# Patient Record
Sex: Male | Born: 1935
Health system: Southern US, Community
[De-identification: ages and names within clinical notes are randomized; demographics above are authoritative.]

## PROBLEM LIST (undated history)

## (undated) DIAGNOSIS — F32A Depression, unspecified: Secondary | ICD-10-CM

## (undated) DIAGNOSIS — K219 Gastro-esophageal reflux disease without esophagitis: Secondary | ICD-10-CM

## (undated) DIAGNOSIS — Z860100 Personal history of colon polyps, unspecified: Secondary | ICD-10-CM

## (undated) DIAGNOSIS — I714 Abdominal aortic aneurysm, without rupture: Secondary | ICD-10-CM

## (undated) DIAGNOSIS — H269 Unspecified cataract: Secondary | ICD-10-CM

## (undated) DIAGNOSIS — I10 Essential (primary) hypertension: Secondary | ICD-10-CM

## (undated) DIAGNOSIS — T7840XA Allergy, unspecified, initial encounter: Secondary | ICD-10-CM

## (undated) DIAGNOSIS — F329 Major depressive disorder, single episode, unspecified: Secondary | ICD-10-CM

## (undated) DIAGNOSIS — F419 Anxiety disorder, unspecified: Secondary | ICD-10-CM

## (undated) DIAGNOSIS — E785 Hyperlipidemia, unspecified: Secondary | ICD-10-CM

## (undated) DIAGNOSIS — Z720 Tobacco use: Secondary | ICD-10-CM

## (undated) DIAGNOSIS — C801 Malignant (primary) neoplasm, unspecified: Secondary | ICD-10-CM

## (undated) DIAGNOSIS — I739 Peripheral vascular disease, unspecified: Secondary | ICD-10-CM

## (undated) DIAGNOSIS — K5790 Diverticulosis of intestine, part unspecified, without perforation or abscess without bleeding: Secondary | ICD-10-CM

## (undated) DIAGNOSIS — I639 Cerebral infarction, unspecified: Secondary | ICD-10-CM

## (undated) DIAGNOSIS — K759 Inflammatory liver disease, unspecified: Secondary | ICD-10-CM

## (undated) DIAGNOSIS — N200 Calculus of kidney: Secondary | ICD-10-CM

## (undated) HISTORY — DX: Malignant (primary) neoplasm, unspecified: C80.1

## (undated) HISTORY — DX: Anxiety disorder, unspecified: F41.9

## (undated) HISTORY — PX: VASECTOMY: SHX75

## (undated) HISTORY — DX: Depression, unspecified: F32.A

## (undated) HISTORY — DX: Cerebral infarction, unspecified: I63.9

## (undated) HISTORY — DX: Inflammatory liver disease, unspecified: K75.9

## (undated) HISTORY — DX: Abdominal aortic aneurysm, without rupture: I71.4

## (undated) HISTORY — DX: Major depressive disorder, single episode, unspecified: F32.9

## (undated) HISTORY — DX: Calculus of kidney: N20.0

## (undated) HISTORY — DX: Peripheral vascular disease, unspecified: I73.9

## (undated) HISTORY — DX: Tobacco use: Z72.0

## (undated) HISTORY — PX: EYE SURGERY: SHX253

## (undated) HISTORY — DX: Unspecified cataract: H26.9

## (undated) HISTORY — DX: Hyperlipidemia, unspecified: E78.5

## (undated) HISTORY — DX: Allergy, unspecified, initial encounter: T78.40XA

## (undated) HISTORY — DX: Essential (primary) hypertension: I10

## (undated) HISTORY — DX: Diverticulosis of intestine, part unspecified, without perforation or abscess without bleeding: K57.90

## (undated) HISTORY — DX: Personal history of colon polyps, unspecified: Z86.0100

## (undated) HISTORY — PX: OTHER SURGICAL HISTORY: SHX169

## (undated) HISTORY — DX: Personal history of colonic polyps: Z86.010

## (undated) HISTORY — DX: Gastro-esophageal reflux disease without esophagitis: K21.9

## (undated) HISTORY — PX: COLONOSCOPY: SHX174

---

## 1998-09-09 ENCOUNTER — Emergency Department (HOSPITAL_COMMUNITY): Admission: EM | Admit: 1998-09-09 | Discharge: 1998-09-09 | Payer: Self-pay | Admitting: Emergency Medicine

## 2002-05-31 ENCOUNTER — Other Ambulatory Visit: Admission: RE | Admit: 2002-05-31 | Discharge: 2002-05-31 | Payer: Self-pay | Admitting: Dermatology

## 2002-07-09 ENCOUNTER — Other Ambulatory Visit: Admission: RE | Admit: 2002-07-09 | Discharge: 2002-07-09 | Payer: Self-pay | Admitting: Dermatology

## 2003-03-13 ENCOUNTER — Emergency Department (HOSPITAL_COMMUNITY): Admission: EM | Admit: 2003-03-13 | Discharge: 2003-03-13 | Payer: Self-pay | Admitting: *Deleted

## 2003-03-13 ENCOUNTER — Encounter: Payer: Self-pay | Admitting: *Deleted

## 2003-05-01 ENCOUNTER — Other Ambulatory Visit: Admission: RE | Admit: 2003-05-01 | Discharge: 2003-05-01 | Payer: Self-pay | Admitting: Dermatology

## 2003-10-28 ENCOUNTER — Other Ambulatory Visit: Admission: RE | Admit: 2003-10-28 | Discharge: 2003-10-28 | Payer: Self-pay | Admitting: Dermatology

## 2004-03-12 ENCOUNTER — Inpatient Hospital Stay (HOSPITAL_COMMUNITY): Admission: EM | Admit: 2004-03-12 | Discharge: 2004-03-16 | Payer: Self-pay | Admitting: Cardiology

## 2004-03-12 ENCOUNTER — Emergency Department (HOSPITAL_COMMUNITY): Admission: EM | Admit: 2004-03-12 | Discharge: 2004-03-12 | Payer: Self-pay | Admitting: Emergency Medicine

## 2004-05-18 ENCOUNTER — Ambulatory Visit (HOSPITAL_COMMUNITY): Admission: RE | Admit: 2004-05-18 | Discharge: 2004-05-18 | Payer: Self-pay | Admitting: Cardiology

## 2004-07-06 ENCOUNTER — Encounter (INDEPENDENT_AMBULATORY_CARE_PROVIDER_SITE_OTHER): Payer: Self-pay | Admitting: *Deleted

## 2004-07-06 ENCOUNTER — Ambulatory Visit (HOSPITAL_COMMUNITY): Admission: RE | Admit: 2004-07-06 | Discharge: 2004-07-06 | Payer: Self-pay | Admitting: Vascular Surgery

## 2004-08-25 ENCOUNTER — Ambulatory Visit: Payer: Self-pay | Admitting: Internal Medicine

## 2004-09-02 ENCOUNTER — Ambulatory Visit: Payer: Self-pay | Admitting: Internal Medicine

## 2004-11-05 ENCOUNTER — Ambulatory Visit: Payer: Self-pay | Admitting: Internal Medicine

## 2004-11-11 ENCOUNTER — Ambulatory Visit: Payer: Self-pay | Admitting: Internal Medicine

## 2004-12-07 ENCOUNTER — Ambulatory Visit: Payer: Self-pay | Admitting: Family Medicine

## 2004-12-21 ENCOUNTER — Ambulatory Visit: Payer: Self-pay | Admitting: Family Medicine

## 2004-12-30 ENCOUNTER — Ambulatory Visit: Payer: Self-pay | Admitting: Family Medicine

## 2005-01-20 ENCOUNTER — Ambulatory Visit: Payer: Self-pay | Admitting: Internal Medicine

## 2005-02-15 ENCOUNTER — Ambulatory Visit: Payer: Self-pay | Admitting: Internal Medicine

## 2005-02-17 ENCOUNTER — Ambulatory Visit: Payer: Self-pay | Admitting: Internal Medicine

## 2005-03-02 ENCOUNTER — Ambulatory Visit: Payer: Self-pay | Admitting: Internal Medicine

## 2005-03-05 ENCOUNTER — Ambulatory Visit: Payer: Self-pay | Admitting: Internal Medicine

## 2005-03-24 ENCOUNTER — Ambulatory Visit: Payer: Self-pay | Admitting: Internal Medicine

## 2005-03-24 ENCOUNTER — Encounter (INDEPENDENT_AMBULATORY_CARE_PROVIDER_SITE_OTHER): Payer: Self-pay | Admitting: *Deleted

## 2005-06-15 ENCOUNTER — Observation Stay (HOSPITAL_COMMUNITY): Admission: EM | Admit: 2005-06-15 | Discharge: 2005-06-17 | Payer: Self-pay | Admitting: Emergency Medicine

## 2005-06-16 ENCOUNTER — Ambulatory Visit: Payer: Self-pay | Admitting: Cardiology

## 2005-06-24 ENCOUNTER — Ambulatory Visit (HOSPITAL_COMMUNITY): Admission: RE | Admit: 2005-06-24 | Discharge: 2005-06-24 | Payer: Self-pay | Admitting: Urology

## 2005-06-28 ENCOUNTER — Ambulatory Visit: Payer: Self-pay | Admitting: Internal Medicine

## 2005-06-30 ENCOUNTER — Ambulatory Visit: Payer: Self-pay | Admitting: Internal Medicine

## 2005-08-19 ENCOUNTER — Ambulatory Visit: Payer: Self-pay | Admitting: Internal Medicine

## 2005-10-01 ENCOUNTER — Ambulatory Visit: Payer: Self-pay | Admitting: Internal Medicine

## 2005-10-06 ENCOUNTER — Ambulatory Visit: Payer: Self-pay | Admitting: Internal Medicine

## 2005-10-08 ENCOUNTER — Ambulatory Visit (HOSPITAL_COMMUNITY): Admission: RE | Admit: 2005-10-08 | Discharge: 2005-10-08 | Payer: Self-pay | Admitting: Internal Medicine

## 2006-03-09 ENCOUNTER — Ambulatory Visit: Payer: Self-pay | Admitting: Internal Medicine

## 2006-03-18 ENCOUNTER — Ambulatory Visit: Payer: Self-pay | Admitting: Internal Medicine

## 2006-04-25 ENCOUNTER — Emergency Department (HOSPITAL_COMMUNITY): Admission: EM | Admit: 2006-04-25 | Discharge: 2006-04-25 | Payer: Self-pay | Admitting: Emergency Medicine

## 2006-05-18 ENCOUNTER — Ambulatory Visit: Payer: Self-pay | Admitting: Internal Medicine

## 2006-06-17 ENCOUNTER — Ambulatory Visit: Payer: Self-pay | Admitting: Family Medicine

## 2006-09-28 ENCOUNTER — Ambulatory Visit: Payer: Self-pay | Admitting: Internal Medicine

## 2006-10-25 ENCOUNTER — Ambulatory Visit: Payer: Self-pay

## 2006-10-26 ENCOUNTER — Ambulatory Visit: Payer: Self-pay | Admitting: Internal Medicine

## 2006-11-09 ENCOUNTER — Encounter (INDEPENDENT_AMBULATORY_CARE_PROVIDER_SITE_OTHER): Payer: Self-pay | Admitting: Specialist

## 2006-11-09 ENCOUNTER — Ambulatory Visit: Payer: Self-pay | Admitting: Internal Medicine

## 2006-11-23 ENCOUNTER — Ambulatory Visit: Payer: Self-pay | Admitting: Internal Medicine

## 2007-01-05 ENCOUNTER — Ambulatory Visit: Payer: Self-pay | Admitting: Internal Medicine

## 2007-04-19 ENCOUNTER — Ambulatory Visit: Payer: Self-pay

## 2007-08-16 ENCOUNTER — Encounter: Payer: Self-pay | Admitting: Internal Medicine

## 2007-09-06 ENCOUNTER — Encounter: Payer: Self-pay | Admitting: *Deleted

## 2007-09-06 DIAGNOSIS — C44300 Unspecified malignant neoplasm of skin of unspecified part of face: Secondary | ICD-10-CM | POA: Insufficient documentation

## 2007-09-06 DIAGNOSIS — E785 Hyperlipidemia, unspecified: Secondary | ICD-10-CM

## 2007-09-06 DIAGNOSIS — F341 Dysthymic disorder: Secondary | ICD-10-CM | POA: Insufficient documentation

## 2007-09-06 DIAGNOSIS — I10 Essential (primary) hypertension: Secondary | ICD-10-CM

## 2007-09-06 DIAGNOSIS — K759 Inflammatory liver disease, unspecified: Secondary | ICD-10-CM | POA: Insufficient documentation

## 2007-09-06 DIAGNOSIS — K219 Gastro-esophageal reflux disease without esophagitis: Secondary | ICD-10-CM

## 2007-09-06 DIAGNOSIS — F17200 Nicotine dependence, unspecified, uncomplicated: Secondary | ICD-10-CM | POA: Insufficient documentation

## 2007-09-06 DIAGNOSIS — E119 Type 2 diabetes mellitus without complications: Secondary | ICD-10-CM | POA: Insufficient documentation

## 2007-09-06 DIAGNOSIS — E782 Mixed hyperlipidemia: Secondary | ICD-10-CM | POA: Insufficient documentation

## 2007-09-06 DIAGNOSIS — J309 Allergic rhinitis, unspecified: Secondary | ICD-10-CM | POA: Insufficient documentation

## 2007-09-07 ENCOUNTER — Ambulatory Visit: Payer: Self-pay | Admitting: Internal Medicine

## 2007-09-07 DIAGNOSIS — J984 Other disorders of lung: Secondary | ICD-10-CM | POA: Insufficient documentation

## 2007-09-07 DIAGNOSIS — J069 Acute upper respiratory infection, unspecified: Secondary | ICD-10-CM | POA: Insufficient documentation

## 2007-09-25 ENCOUNTER — Ambulatory Visit: Payer: Self-pay | Admitting: Internal Medicine

## 2007-10-08 ENCOUNTER — Emergency Department (HOSPITAL_COMMUNITY): Admission: EM | Admit: 2007-10-08 | Discharge: 2007-10-08 | Payer: Self-pay | Admitting: Emergency Medicine

## 2007-10-10 ENCOUNTER — Telehealth: Payer: Self-pay | Admitting: Internal Medicine

## 2007-10-11 ENCOUNTER — Emergency Department (HOSPITAL_COMMUNITY): Admission: EM | Admit: 2007-10-11 | Discharge: 2007-10-11 | Payer: Self-pay | Admitting: Emergency Medicine

## 2007-10-13 ENCOUNTER — Telehealth: Payer: Self-pay | Admitting: Internal Medicine

## 2007-10-20 ENCOUNTER — Ambulatory Visit: Payer: Self-pay | Admitting: Internal Medicine

## 2007-10-23 ENCOUNTER — Telehealth: Payer: Self-pay | Admitting: Internal Medicine

## 2007-10-27 ENCOUNTER — Encounter (INDEPENDENT_AMBULATORY_CARE_PROVIDER_SITE_OTHER): Payer: Self-pay | Admitting: *Deleted

## 2007-11-28 ENCOUNTER — Ambulatory Visit (HOSPITAL_COMMUNITY): Payer: Self-pay | Admitting: Psychiatry

## 2007-12-06 ENCOUNTER — Telehealth (INDEPENDENT_AMBULATORY_CARE_PROVIDER_SITE_OTHER): Payer: Self-pay | Admitting: *Deleted

## 2007-12-12 ENCOUNTER — Ambulatory Visit: Payer: Self-pay | Admitting: Internal Medicine

## 2007-12-12 LAB — CONVERTED CEMR LAB
Bilirubin, Direct: 0.1 mg/dL (ref 0.0–0.3)
CO2: 32 meq/L (ref 19–32)
Calcium: 9.5 mg/dL (ref 8.4–10.5)
Chloride: 100 meq/L (ref 96–112)
Creatinine, Ser: 1.5 mg/dL (ref 0.4–1.5)
GFR calc Af Amer: 59 mL/min
GFR calc non Af Amer: 49 mL/min
Glucose, Bld: 160 mg/dL — ABNORMAL HIGH (ref 70–99)
Hgb A1c MFr Bld: 7.2 % — ABNORMAL HIGH (ref 4.6–6.0)
PSA: 0.22 ng/mL (ref 0.10–4.00)
Potassium: 4.3 meq/L (ref 3.5–5.1)
Sodium: 138 meq/L (ref 135–145)

## 2007-12-13 ENCOUNTER — Ambulatory Visit: Payer: Self-pay | Admitting: Internal Medicine

## 2007-12-13 DIAGNOSIS — I714 Abdominal aortic aneurysm, without rupture: Secondary | ICD-10-CM

## 2007-12-14 ENCOUNTER — Encounter (INDEPENDENT_AMBULATORY_CARE_PROVIDER_SITE_OTHER): Payer: Self-pay | Admitting: *Deleted

## 2007-12-17 DIAGNOSIS — I714 Abdominal aortic aneurysm, without rupture, unspecified: Secondary | ICD-10-CM

## 2007-12-17 HISTORY — DX: Abdominal aortic aneurysm, without rupture: I71.4

## 2007-12-17 HISTORY — DX: Abdominal aortic aneurysm, without rupture, unspecified: I71.40

## 2007-12-25 ENCOUNTER — Ambulatory Visit: Payer: Self-pay

## 2007-12-25 ENCOUNTER — Encounter: Payer: Self-pay | Admitting: Internal Medicine

## 2007-12-26 ENCOUNTER — Encounter: Payer: Self-pay | Admitting: Internal Medicine

## 2007-12-28 ENCOUNTER — Encounter: Payer: Self-pay | Admitting: Internal Medicine

## 2008-01-11 ENCOUNTER — Ambulatory Visit: Payer: Self-pay | Admitting: Internal Medicine

## 2008-02-16 ENCOUNTER — Ambulatory Visit: Payer: Self-pay | Admitting: Endocrinology

## 2008-02-16 DIAGNOSIS — J209 Acute bronchitis, unspecified: Secondary | ICD-10-CM | POA: Insufficient documentation

## 2008-02-25 ENCOUNTER — Encounter: Payer: Self-pay | Admitting: Internal Medicine

## 2008-03-22 ENCOUNTER — Emergency Department (HOSPITAL_COMMUNITY): Admission: EM | Admit: 2008-03-22 | Discharge: 2008-03-22 | Payer: Self-pay | Admitting: Emergency Medicine

## 2008-05-08 ENCOUNTER — Ambulatory Visit: Payer: Self-pay | Admitting: Internal Medicine

## 2008-05-28 ENCOUNTER — Ambulatory Visit: Payer: Self-pay | Admitting: Internal Medicine

## 2008-05-28 DIAGNOSIS — R42 Dizziness and giddiness: Secondary | ICD-10-CM

## 2008-05-28 LAB — CONVERTED CEMR LAB
AST: 17 units/L (ref 0–37)
Albumin: 4 g/dL (ref 3.5–5.2)
Alkaline Phosphatase: 52 units/L (ref 39–117)
Calcium: 9.9 mg/dL (ref 8.4–10.5)
Creatinine, Ser: 0.9 mg/dL (ref 0.4–1.5)
Direct LDL: 152.6 mg/dL
GFR calc Af Amer: 107 mL/min
HDL: 33.4 mg/dL — ABNORMAL LOW (ref >39.0)
PSA, Free Pct: 43 (ref >25)
PSA: 0.23 ng/mL (ref 0.10–4.00)
Triglycerides: 275 mg/dL (ref 0–149)

## 2008-05-31 ENCOUNTER — Encounter: Payer: Self-pay | Admitting: Internal Medicine

## 2008-06-11 ENCOUNTER — Ambulatory Visit (HOSPITAL_COMMUNITY): Payer: Self-pay | Admitting: Psychiatry

## 2008-06-25 ENCOUNTER — Ambulatory Visit (HOSPITAL_COMMUNITY): Payer: Self-pay | Admitting: Psychiatry

## 2008-06-26 ENCOUNTER — Telehealth: Payer: Self-pay | Admitting: Internal Medicine

## 2008-07-02 ENCOUNTER — Ambulatory Visit: Payer: Self-pay

## 2008-07-02 ENCOUNTER — Encounter: Payer: Self-pay | Admitting: Internal Medicine

## 2008-07-04 ENCOUNTER — Telehealth (INDEPENDENT_AMBULATORY_CARE_PROVIDER_SITE_OTHER): Payer: Self-pay | Admitting: *Deleted

## 2008-07-05 ENCOUNTER — Telehealth: Payer: Self-pay | Admitting: Internal Medicine

## 2008-07-10 ENCOUNTER — Ambulatory Visit: Payer: Self-pay | Admitting: Vascular Surgery

## 2008-07-17 ENCOUNTER — Ambulatory Visit: Payer: Self-pay | Admitting: Vascular Surgery

## 2008-07-17 ENCOUNTER — Encounter: Admission: RE | Admit: 2008-07-17 | Discharge: 2008-07-17 | Payer: Self-pay | Admitting: Vascular Surgery

## 2008-08-15 ENCOUNTER — Ambulatory Visit (HOSPITAL_COMMUNITY): Payer: Self-pay | Admitting: Psychiatry

## 2008-08-23 ENCOUNTER — Ambulatory Visit: Payer: Self-pay | Admitting: Internal Medicine

## 2008-09-10 ENCOUNTER — Ambulatory Visit (HOSPITAL_COMMUNITY): Payer: Self-pay | Admitting: Psychiatry

## 2008-11-12 ENCOUNTER — Ambulatory Visit (HOSPITAL_COMMUNITY): Payer: Self-pay | Admitting: Psychiatry

## 2008-11-14 ENCOUNTER — Telehealth: Payer: Self-pay | Admitting: Internal Medicine

## 2008-11-18 ENCOUNTER — Telehealth: Payer: Self-pay | Admitting: Internal Medicine

## 2008-12-20 ENCOUNTER — Ambulatory Visit: Payer: Self-pay | Admitting: Internal Medicine

## 2009-01-06 ENCOUNTER — Telehealth: Payer: Self-pay | Admitting: Internal Medicine

## 2009-01-06 ENCOUNTER — Ambulatory Visit: Payer: Self-pay

## 2009-01-06 ENCOUNTER — Encounter: Payer: Self-pay | Admitting: Internal Medicine

## 2009-01-30 ENCOUNTER — Ambulatory Visit (HOSPITAL_COMMUNITY): Payer: Self-pay | Admitting: Psychiatry

## 2009-02-10 ENCOUNTER — Encounter: Payer: Self-pay | Admitting: Internal Medicine

## 2009-02-12 ENCOUNTER — Encounter: Payer: Self-pay | Admitting: Internal Medicine

## 2009-02-12 ENCOUNTER — Ambulatory Visit: Payer: Self-pay | Admitting: Internal Medicine

## 2009-02-12 LAB — HM COLONOSCOPY: HM Colonoscopy: 2

## 2009-02-13 ENCOUNTER — Encounter: Payer: Self-pay | Admitting: Internal Medicine

## 2009-04-29 ENCOUNTER — Ambulatory Visit: Payer: Self-pay | Admitting: Internal Medicine

## 2009-04-29 ENCOUNTER — Telehealth: Payer: Self-pay | Admitting: Internal Medicine

## 2009-04-29 ENCOUNTER — Ambulatory Visit (HOSPITAL_COMMUNITY): Payer: Self-pay | Admitting: Psychiatry

## 2009-04-29 LAB — CONVERTED CEMR LAB
Albumin: 3.7 g/dL (ref 3.5–5.2)
Basophils Relative: 0.5 % (ref 0.0–3.0)
Chloride: 101 meq/L (ref 96–112)
Cholesterol: 119 mg/dL (ref 0–200)
Eosinophils Relative: 1.2 % (ref 0.0–5.0)
GFR calc non Af Amer: 87.73 mL/min (ref >60)
HCT: 47.4 % (ref 39.0–52.0)
Hemoglobin: 16.7 g/dL (ref 13.0–17.0)
LDL Cholesterol: 55 mg/dL (ref 0–99)
Lymphs Abs: 1.8 10*3/uL (ref 0.7–4.0)
MCV: 93.5 fL (ref 78.0–100.0)
Monocytes Relative: 12.3 % — ABNORMAL HIGH (ref 3.0–12.0)
Neutro Abs: 3.7 10*3/uL (ref 1.4–7.7)
Potassium: 4.1 meq/L (ref 3.5–5.1)
RBC: 5.07 M/uL (ref 4.22–5.81)
Sodium: 136 meq/L (ref 135–145)
Total CHOL/HDL Ratio: 3
Total Protein: 6.9 g/dL (ref 6.0–8.3)
Triglycerides: 133 mg/dL (ref 0.0–149.0)
VLDL: 26.6 mg/dL (ref 0.0–40.0)
WBC: 6.4 10*3/uL (ref 4.5–10.5)

## 2009-04-29 LAB — HM DIABETES FOOT EXAM

## 2009-05-20 ENCOUNTER — Telehealth: Payer: Self-pay | Admitting: Internal Medicine

## 2009-06-30 ENCOUNTER — Telehealth: Payer: Self-pay | Admitting: Internal Medicine

## 2009-07-09 ENCOUNTER — Ambulatory Visit: Payer: Self-pay

## 2009-07-24 ENCOUNTER — Telehealth (INDEPENDENT_AMBULATORY_CARE_PROVIDER_SITE_OTHER): Payer: Self-pay | Admitting: *Deleted

## 2009-07-30 ENCOUNTER — Telehealth: Payer: Self-pay | Admitting: Internal Medicine

## 2009-08-08 ENCOUNTER — Ambulatory Visit: Payer: Self-pay | Admitting: Vascular Surgery

## 2009-08-08 ENCOUNTER — Encounter: Payer: Self-pay | Admitting: Internal Medicine

## 2009-08-15 ENCOUNTER — Encounter: Admission: RE | Admit: 2009-08-15 | Discharge: 2009-08-15 | Payer: Self-pay | Admitting: Vascular Surgery

## 2009-08-18 HISTORY — PX: OTHER SURGICAL HISTORY: SHX169

## 2009-08-29 ENCOUNTER — Ambulatory Visit: Payer: Self-pay | Admitting: Vascular Surgery

## 2009-09-15 ENCOUNTER — Ambulatory Visit: Payer: Self-pay | Admitting: Vascular Surgery

## 2009-09-16 ENCOUNTER — Inpatient Hospital Stay (HOSPITAL_COMMUNITY): Admission: RE | Admit: 2009-09-16 | Discharge: 2009-09-17 | Payer: Self-pay | Admitting: Vascular Surgery

## 2009-09-30 ENCOUNTER — Ambulatory Visit (HOSPITAL_COMMUNITY): Payer: Self-pay | Admitting: Psychiatry

## 2009-10-13 ENCOUNTER — Encounter: Admission: RE | Admit: 2009-10-13 | Discharge: 2009-10-13 | Payer: Self-pay | Admitting: Vascular Surgery

## 2009-10-13 ENCOUNTER — Ambulatory Visit: Payer: Self-pay | Admitting: Vascular Surgery

## 2009-10-13 ENCOUNTER — Encounter: Payer: Self-pay | Admitting: Internal Medicine

## 2009-10-31 ENCOUNTER — Ambulatory Visit: Payer: Self-pay | Admitting: Internal Medicine

## 2010-01-08 ENCOUNTER — Telehealth: Payer: Self-pay | Admitting: Internal Medicine

## 2010-01-29 ENCOUNTER — Ambulatory Visit (HOSPITAL_COMMUNITY): Payer: Self-pay | Admitting: Psychiatry

## 2010-02-23 ENCOUNTER — Ambulatory Visit: Payer: Self-pay | Admitting: Internal Medicine

## 2010-02-23 DIAGNOSIS — H902 Conductive hearing loss, unspecified: Secondary | ICD-10-CM | POA: Insufficient documentation

## 2010-03-02 ENCOUNTER — Encounter: Payer: Self-pay | Admitting: Internal Medicine

## 2010-03-04 ENCOUNTER — Encounter: Payer: Self-pay | Admitting: Internal Medicine

## 2010-03-09 ENCOUNTER — Telehealth: Payer: Self-pay | Admitting: Internal Medicine

## 2010-03-12 ENCOUNTER — Encounter: Payer: Self-pay | Admitting: Internal Medicine

## 2010-03-27 ENCOUNTER — Telehealth: Payer: Self-pay | Admitting: Internal Medicine

## 2010-04-27 ENCOUNTER — Encounter: Payer: Self-pay | Admitting: Internal Medicine

## 2010-05-08 ENCOUNTER — Encounter: Payer: Self-pay | Admitting: Internal Medicine

## 2010-05-08 ENCOUNTER — Ambulatory Visit: Payer: Self-pay | Admitting: Vascular Surgery

## 2010-05-08 ENCOUNTER — Encounter: Admission: RE | Admit: 2010-05-08 | Discharge: 2010-05-08 | Payer: Self-pay | Admitting: Vascular Surgery

## 2010-05-28 ENCOUNTER — Ambulatory Visit (HOSPITAL_COMMUNITY): Payer: Self-pay | Admitting: Psychiatry

## 2010-09-01 ENCOUNTER — Telehealth: Payer: Self-pay | Admitting: Internal Medicine

## 2010-09-07 ENCOUNTER — Ambulatory Visit: Payer: Self-pay | Admitting: Internal Medicine

## 2010-09-22 ENCOUNTER — Ambulatory Visit (HOSPITAL_COMMUNITY): Payer: Self-pay | Admitting: Psychiatry

## 2010-11-07 ENCOUNTER — Encounter: Payer: Self-pay | Admitting: Internal Medicine

## 2010-11-08 ENCOUNTER — Encounter: Payer: Self-pay | Admitting: Vascular Surgery

## 2010-11-17 NOTE — Assessment & Plan Note (Signed)
Summary: sinus pain/cd   Vital Signs:  Patient profile:   77 year old male Height:      72 inches Weight:      220 pounds BMI:     29.95 O2 Sat:      95 % on Room air Temp:     97.7 degrees F oral Pulse rate:   76 / minute BP sitting:   138 / 80  (left arm) Cuff size:   regular  Vitals Entered By: Bill Salinas CMA (October 31, 2009 3:11 PM)  O2 Flow:  Room air CC: pt presents today with sinus pressure and runny nose x 4 days/ ab   Primary Care Provider:  Jataya Wann  CC:  pt presents today with sinus pressure and runny nose x 4 days/ ab.  History of Present Illness: Pagtinet had stent repair of AAA infrarenal - Nov '10. Did very well.   1 MOnth ago to urgent care for sinusitis. Treated with amoxicillin He never did get well and has had ongoing sinus pressure and his ears feel full. NO fever. Some rhinorrhea, no sore throat, no cough, no SOB. Started back on afrin nasal spray.  Current Medications (verified): 1)  Altace 5 Mg Caps (Ramipril) .... Take 1 Tablet By Mouth Once A Day 2)  Atenolol 25 Mg  Tabs (Atenolol) .... Take One Tablet Once Daily 3)  Fluoxetine Hcl 20 Mg Caps (Fluoxetine Hcl) .... Once Daily 4)  Amaryl 1 Mg  Tabs (Glimepiride) .... 2 Daily 5)  Hydrochlorothiazide 25 Mg Tabs (Hydrochlorothiazide) .Marland Kitchen.. 1 By Mouth Once Daily 6)  Nifedipine Cr Osmotic 30 Mg Tb24 (Nifedipine) .... Once Daily 7)  Vytorin 10-40 Mg Tabs (Ezetimibe-Simvastatin) .... Take 1 Tablet By Mouth Once A Day 8)  Aspirin 81 Mg  Tabs (Aspirin) .... Take One Tablet Once Daily 9)  Valium 2 Mg Tabs (Diazepam) .... Take 1 Tablet By Mouth Two Times A Day 10)  Nortriptyline Hcl 10 Mg Caps (Nortriptyline Hcl) .... Take 1 Tab By Mouth At Bedtime  Allergies (verified): 1)  ! * Ivp Dye 2)  * Antihistamines 3)  Zestoretic (Lisinopril-Hydrochlorothiazide)  Past History:  Past Medical History: Last updated: 05/28/2008 Hx of GERD (ICD-530.81) Hx of ANXIETY DEPRESSION (ICD-300.4) TOBACCO ABUSE  (ICD-305.1) HYPERLIPIDEMIA (ICD-272.4) DIABETES MELLITUS (ICD-250.00) ALLERGIC RHINITIS (ICD-477.9) HEPATITIS (ICD-573.3) HYPERTENSION (ICD-401.9) Periphearal vascular disease. R/O ADVERSE DRUG REACTION (ICD-995.20) AAA 4.7 cm 3/09  Family History: Last updated: 2007/12/23 father-deceased @70 ; emphysema mother- deceased @ 59 ASVD, OBS Brother - died lung cancer Sister - deceased, CP Sister - renal failure, OD  Social History: Last updated: December 23, 2007 married '67- widowed '98 retired 11 years - keeping busy. Lives alone 1- step-daughter, 1 son - '68; 3 grandchildren does odd-jobs, yard work  Past Surgical History: * SURGICAL EXCISION BASAL CELL CARCINOMA LOWER LIMB AMPUTATION, OTHER TOE 5TH (ICD-V49.72) * TYMPANIC EARDRUM REPAIR PERCUTANEOUS STENT GRAFT REPAIR OF INFRARENAL AAA  5.6 CM 11/10(T. Early)    Review of Systems  The patient denies anorexia, fever, weight loss, weight gain, hoarseness, chest pain, dyspnea on exertion, prolonged cough, abdominal pain, hematuria, incontinence, muscle weakness, transient blindness, difficulty walking, and enlarged lymph nodes.    Physical Exam  General:  WNWD white male in no distress Head:  normocephalic and atraumatic.   Eyes:  vision grossly intact, pupils equal, pupils round, corneas and lenses clear, and no injection.   Ears:  Right EAC and TM normal; Left EAC with erythema, TM with perforation superiorly, bulging and angry looking Neck:  full ROM and no thyromegaly.   Lungs:  normal respiratory effort, normal breath sounds, no crackles, and no wheezes.   Heart:  normal rate, regular rhythm, and no murmur.   Abdomen:  soft, non-tender, and normal bowel sounds.     Impression & Recommendations:  Problem # 1:  ACUTE SEROUS OTITIS MEDIA (ICD-381.01) Angry TM  Plan- Augmentin 875mg  two times a day x 10 days          sudafed 30 three times a day   Complete Medication List: 1)  Altace 5 Mg Caps (Ramipril) .... Take 1  tablet by mouth once a day 2)  Atenolol 25 Mg Tabs (Atenolol) .... Take one tablet once daily 3)  Fluoxetine Hcl 20 Mg Caps (Fluoxetine hcl) .... Once daily 4)  Amaryl 1 Mg Tabs (Glimepiride) .... 2 daily 5)  Hydrochlorothiazide 25 Mg Tabs (Hydrochlorothiazide) .Marland Kitchen.. 1 by mouth once daily 6)  Nifedipine Cr Osmotic 30 Mg Tb24 (Nifedipine) .... Once daily 7)  Vytorin 10-40 Mg Tabs (Ezetimibe-simvastatin) .... Take 1 tablet by mouth once a day 8)  Aspirin 81 Mg Tabs (Aspirin) .... Take one tablet once daily 9)  Valium 2 Mg Tabs (Diazepam) .... Take 1 tablet by mouth two times a day 10)  Nortriptyline Hcl 10 Mg Caps (Nortriptyline hcl) .... Take 1 tab by mouth at bedtime 11)  Amoxicillin-pot Clavulanate 875-125 Mg Tabs (Amoxicillin-pot clavulanate) .Marland Kitchen.. 1 by mouth two times a day x 10 for om with perforated drum  Patient Instructions: 1)  Ear infection - plan Augmentin 875 mg two times a day x 10 days, sudafed (generic) 30mg  three times a day for 7 days.  Prescriptions: AMOXICILLIN-POT CLAVULANATE 875-125 MG TABS (AMOXICILLIN-POT CLAVULANATE) 1 by mouth two times a day x 10 for OM with perforated drum  #20 x 0   Entered and Authorized by:   Jacques Navy MD   Signed by:   Jacques Navy MD on 10/31/2009   Method used:   Electronically to        [facility] [phone]* (retail)       [address]       [place], [place]  [address]       Ph: [phone] or [phone]       Fax: [phone]   RxID:   [id number]      Not Administered:    Influenza Vaccine not given due to: declined

## 2010-11-17 NOTE — Letter (Signed)
Summary: [facility]   Imported By: Sherian Rein 05/26/2010 10:50:56  _____________________________________________________________________  External Attachment:    Type:   Image     Comment:   External Document

## 2010-11-17 NOTE — Assessment & Plan Note (Signed)
Summary: fu---stc   Vital Signs:  Patient profile:   77 year old male Height:      72 inches Weight:      217 pounds BMI:     29.54 O2 Sat:      97 % on Room air Temp:     98.0 degrees F oral Pulse rate:   83 / minute BP sitting:   138 / 82  (left arm) Cuff size:   regular  Vitals Entered By: Bill Salinas CMA (Feb 23, 2010 10:32 AM)  O2 Flow:  Room air CC: pt here with complaint of hearing loss in left ear/ ab   Primary Care Provider:  Norins  CC:  pt here with complaint of hearing loss in left ear/ ab.  History of Present Illness: The patient was treated in January 2011 for OM with TM perforation in the left ear.  He returns today with a complaint of continued hearing loss on the left side.  He says it feels like his ear is clogged up at times.  He will pinch his nose and blow out which will allow for temporary improvement of hearing.  He complains of itching in his ear canal.  He denies ear pain.  He has had some allergic symptoms recently including runny nose and sore throat.  He has visited an ENT in the past and says they had no recommendations.  Current Medications (verified): 1)  Altace 5 Mg Caps (Ramipril) .... Take 1 Tablet By Mouth Once A Day 2)  Atenolol 25 Mg  Tabs (Atenolol) .... Take One Tablet Once Daily 3)  Fluoxetine Hcl 20 Mg Caps (Fluoxetine Hcl) .... Once Daily 4)  Amaryl 1 Mg  Tabs (Glimepiride) .... 2 Daily 5)  Hydrochlorothiazide 25 Mg Tabs (Hydrochlorothiazide) .Marland Kitchen.. 1 By Mouth Once Daily 6)  Nifedipine Cr Osmotic 30 Mg Tb24 (Nifedipine) .... Once Daily 7)  Vytorin 10-40 Mg Tabs (Ezetimibe-Simvastatin) .... Take 1 Tablet By Mouth Once A Day 8)  Aspirin 81 Mg  Tabs (Aspirin) .... Take One Tablet Once Daily 9)  Valium 2 Mg Tabs (Diazepam) .... Take 1 Tablet By Mouth Two Times A Day 10)  Nortriptyline Hcl 10 Mg Caps (Nortriptyline Hcl) .... Take 1 Tab By Mouth At Bedtime  Allergies (verified): 1)  ! * Ivp Dye 2)  * Antihistamines 3)  Zestoretic  (Lisinopril-Hydrochlorothiazide)  Past History:  Past Medical History: Last updated: 05/28/2008 Hx of GERD (ICD-530.81) Hx of ANXIETY DEPRESSION (ICD-300.4) TOBACCO ABUSE (ICD-305.1) HYPERLIPIDEMIA (ICD-272.4) DIABETES MELLITUS (ICD-250.00) ALLERGIC RHINITIS (ICD-477.9) HEPATITIS (ICD-573.3) HYPERTENSION (ICD-401.9) Periphearal vascular disease. R/O ADVERSE DRUG REACTION (ICD-995.20) AAA 4.7 cm 3/09  Past Surgical History: Last updated: 10/31/2009 * SURGICAL EXCISION BASAL CELL CARCINOMA LOWER LIMB AMPUTATION, OTHER TOE 5TH (ICD-V49.72) * TYMPANIC EARDRUM REPAIR PERCUTANEOUS STENT GRAFT REPAIR OF INFRARENAL AAA  5.6 CM 11/10(T. Early)    Family History: Last updated: Jan 03, 2008 father-deceased @70 ; emphysema mother- deceased @ 21 ASVD, OBS Brother - died lung cancer Sister - deceased, CP Sister - renal failure, OD  Social History: Last updated: 03-Jan-2008 married '67- widowed '98 retired 11 years - keeping busy. Lives alone 1- step-daughter, 1 son - '68; 3 grandchildren does odd-jobs, yard work  Risk Factors: Alcohol Use: 0 (04/29/2009) Caffeine Use: o (04/29/2009) Diet: tries to follow diabetic diet. (04/29/2009) Exercise: yes (04/29/2009)  Review of Systems       The patient complains of decreased hearing and prolonged cough.  The patient denies fever, weight loss, weight gain, and hoarseness.  Physical Exam  General:  alert and well-nourished.   Head:  normocephalic and atraumatic.   Eyes:  vision grossly intact, pupils equal, pupils round, and pupils reactive to light.   Ears:  R ear normal.  Grossly diminished hearing in the Left ear.  Left ear canal erythematous with no exudate.  The posterior superior aspect of the Left TM shows evidence of an old perforation that is partially healed with thickening.  Weber test lateralizes to the Left ear.  Rinne test shows AC > BC in the Right ear and AC < BC in the Left ear.   Nose:  no external deformity.     Mouth:  pharynx pink and moist.   Neck:  supple, no masses, no thyromegaly, and no thyroid nodules or tenderness.   Lungs:  normal respiratory effort, no accessory muscle use, no crackles, and no wheezes.   Heart:  normal rate, regular rhythm, no murmur, no gallop, and no rub.   Cervical Nodes:  no anterior cervical adenopathy and no posterior cervical adenopathy.     Impression & Recommendations:  Problem # 1:  HEARING LOSS, CONDUCTIVE, LEFT (ICD-389.00) The patient has evidence of conductive hearing loss in his left ear that is likely secondary to his TM perforation.  His description of the hearing loss suggests some element of Eustachian Tube Dysfunction.  With his history of allergic rhinitis and recent allergic symptoms it might improve his symptoms to try a steroid nasal spray.  The patient is intolerant of antihistamines.   Plan- Fluticasone NS one spray in each nostril every day.  Patient was counseled to try ear drops made of 50:50 white vinegar and rubbing alcohol for ear itching rather than using Qtips.  Problem # 2:  HYPERLIPIDEMIA (ICD-272.4) The patient is having difficulty getting Vytorin covered by his insurance.  Plan: Will switch to Simvastatin 80mg  by mouth once daily.  Will check lipids and LFTs in one month.  Complete Medication List: 1)  Altace 5 Mg Caps (Ramipril) .... Take 1 tablet by mouth once a day 2)  Atenolol 25 Mg Tabs (Atenolol) .... Take one tablet once daily 3)  Fluoxetine Hcl 20 Mg Caps (Fluoxetine hcl) .... Once daily 4)  Amaryl 1 Mg Tabs (Glimepiride) .... 2 daily 5)  Hydrochlorothiazide 25 Mg Tabs (Hydrochlorothiazide) .Marland Kitchen.. 1 by mouth once daily 6)  Nifedipine Cr Osmotic 30 Mg Tb24 (Nifedipine) .... Once daily 7)  Vytorin 10-40 Mg Tabs (Ezetimibe-simvastatin) .... Take 1 tablet by mouth once a day 8)  Aspirin 81 Mg Tabs (Aspirin) .... Take one tablet once daily 9)  Valium 2 Mg Tabs (Diazepam) .... Take 1 tablet by mouth two times a day 10)   Nortriptyline Hcl 10 Mg Caps (Nortriptyline hcl) .... Take 1 tab by mouth at bedtime

## 2010-11-17 NOTE — Progress Notes (Signed)
Summary: glimepiride refill  Phone Note Call from Patient Call back at [facility] Phone [phone]   Summary of Call: Patient and [facility] called in regards to Glimepiride refills. Sent new prescription, patient aware. Initial call taken by: Lucious Groves,  January 08, 2010 11:03 AM    Prescriptions: AMARYL 1 MG  TABS (GLIMEPIRIDE) 2 daily  #180 x 3   Entered by:   Lucious Groves   Authorized by:   Jacques Navy MD   Signed by:   Lucious Groves on 01/08/2010   Method used:   Faxed to ...       [facility]* (mail-order)             ,          Ph: [phone]       Fax: [phone]   RxID:   [id number]

## 2010-11-17 NOTE — Letter (Signed)
Summary: Statement of Certifying Physician for Therapeutic Shoes/[place]  Statement of Certifying Physician for Therapeutic Shoes/[facility]   Imported By: Sherian Rein 03/04/2010 10:06:47  _____________________________________________________________________  External Attachment:    Type:   Image     Comment:   External Document

## 2010-11-17 NOTE — Progress Notes (Signed)
Summary: Vytorin  Phone Note Call from Patient   Caller: Patient Summary of Call: Patient called  stating that insurance would not cover Vytorin w/o trying generic first. Per pt he tried simvastatin 80mg  and d/c it due to leg weakness and fatigue. He states that [facility] will fax ov orveride form.Marland KitchenMarland KitchenAlvy Beal Archie CMA  Mar 09, 2010 8:47 AM    Form completed and waiting signature when MD returns tomorrow. Informed pt of status...............Marland KitchenLamar Sprinkles, CMA  Mar 11, 2010 10:24 AM   Follow-up for Phone Call        Faxed earlier this afternoon Follow-up by: Lamar Sprinkles, CMA,  Mar 12, 2010 4:56 PM  Additional Follow-up for Phone Call Additional follow up Details #1::        Approved, w/no expiration date per pt's insurance.  Additional Follow-up by: Lamar Sprinkles, CMA,  Mar 13, 2010 3:21 PM

## 2010-11-17 NOTE — Medication Information (Signed)
Summary: Simvastatin/[employer]  Simvastatin/[employer]   Imported By: Sherian Rein 03/17/2010 14:28:33  _____________________________________________________________________  External Attachment:    Type:   Image     Comment:   External Document

## 2010-11-17 NOTE — Progress Notes (Signed)
    Immunization History:  Influenza Immunization History:    Influenza:  0.5 ml im right arm  lot# [id number] exp date 04/17/2011 (08/25/2010)

## 2010-11-17 NOTE — Letter (Signed)
Summary: Diabetic Shoes/[facility]  Diabetic Shoes/[facility]   Imported By: Sherian Rein 04/29/2010 14:59:36  _____________________________________________________________________  External Attachment:    Type:   Image     Comment:   External Document

## 2010-11-17 NOTE — Medication Information (Signed)
Summary: [facility]   Imported By: Lester [place] 03/06/2010 08:50:40  _____________________________________________________________________  External Attachment:    Type:   Image     Comment:   External Document

## 2010-11-17 NOTE — Letter (Signed)
Summary: [facility]   Imported By: Lester [place] 10/30/2009 09:48:28  _____________________________________________________________________  External Attachment:    Type:   Image     Comment:   External Document

## 2010-11-17 NOTE — Assessment & Plan Note (Signed)
Summary: ?sinus inf/cd   Vital Signs:  Patient profile:   77 year old male Height:      72 inches Weight:      216 pounds BMI:     29.40 O2 Sat:      96 % on Room air Temp:     98.7 degrees F oral Pulse rate:   76 / minute BP sitting:   122 / 70  (left arm) Cuff size:   regular  Vitals Entered By: Bill Salinas CMA (September 07, 2010 1:50 PM)  O2 Flow:  Room air CC: pt here with c/o upper chest congestion, sinus pressure, productive cough with clear mucous production/ x 2 weeks/ ab   Primary Care Provider:  Norins  CC:  pt here with c/o upper chest congestion, sinus pressure, and productive cough with clear mucous production/ x 2 weeks/ ab.  History of Present Illness: Patient presents for a week long h/o URI symptoms: cough and ocngestion. He has not had any fever or chills. He has had sore throat and persistent cough that is non-productive. He went to Urgent Care in [place] and he was treated with amoxicillin but has had a persistent cough. He denies any SOB. He does report some wheezing. He has had pain in the upper jaw and teeth.   Current Medications (verified): 1)  Altace 5 Mg Caps (Ramipril) .... Take 1 Tablet By Mouth Once A Day 2)  Atenolol 25 Mg  Tabs (Atenolol) .... Take One Tablet Once Daily 3)  Fluoxetine Hcl 20 Mg Caps (Fluoxetine Hcl) .... Once Daily 4)  Amaryl 1 Mg  Tabs (Glimepiride) .... 2 Daily 5)  Hydrochlorothiazide 25 Mg Tabs (Hydrochlorothiazide) .Marland Kitchen.. 1 By Mouth Once Daily 6)  Nifedipine Cr Osmotic 30 Mg Tb24 (Nifedipine) .... Once Daily 7)  Vytorin 10-40 Mg Tabs (Ezetimibe-Simvastatin) .... Take 1 Tablet By Mouth Once A Day 8)  Aspirin 81 Mg  Tabs (Aspirin) .... Take One Tablet Once Daily 9)  Valium 2 Mg Tabs (Diazepam) .... Take 1 Tablet By Mouth Two Times A Day 10)  Nortriptyline Hcl 10 Mg Caps (Nortriptyline Hcl) .... Take 1 Tab By Mouth At Bedtime  Allergies (verified): 1)  ! * Ivp Dye 2)  * Antihistamines 3)  Zestoretic  (Lisinopril-Hydrochlorothiazide)  Past History:  Past Medical History: Last updated: 05/28/2008 Hx of GERD (ICD-530.81) Hx of ANXIETY DEPRESSION (ICD-300.4) TOBACCO ABUSE (ICD-305.1) HYPERLIPIDEMIA (ICD-272.4) DIABETES MELLITUS (ICD-250.00) ALLERGIC RHINITIS (ICD-477.9) HEPATITIS (ICD-573.3) HYPERTENSION (ICD-401.9) Periphearal vascular disease. R/O ADVERSE DRUG REACTION (ICD-995.20) AAA 4.7 cm 3/09  Past Surgical History: Last updated: 10/31/2009 * SURGICAL EXCISION BASAL CELL CARCINOMA LOWER LIMB AMPUTATION, OTHER TOE 5TH (ICD-V49.72) * TYMPANIC EARDRUM REPAIR PERCUTANEOUS STENT GRAFT REPAIR OF INFRARENAL AAA  5.6 CM 11/10(T. Early)    Family History: Last updated: 2008-01-08 father-deceased @68 ; emphysema mother- deceased @ 35 ASVD, OBS Brother - died lung cancer Sister - deceased, CP Sister - renal failure, OD  Social History: Last updated: 08-Jan-2008 married '67- widowed '98 retired 11 years - keeping busy. Lives alone 1- step-daughter, 1 son - '68; 3 grandchildren does odd-jobs, yard work  Review of Systems  The patient denies anorexia, fever, weight loss, weight gain, decreased hearing, chest pain, dyspnea on exertion, prolonged cough, hemoptysis, abdominal pain, severe indigestion/heartburn, muscle weakness, difficulty walking, and abnormal bleeding.    Physical Exam  General:  Well-developed,well-nourished,in no acute distress; alert,appropriate and cooperative throughout examination Head:  normocephalic and atraumatic.  Mild tenderness to percussion over the maxillary sinus Eyes:  vision  grossly intact, pupils equal, and pupils round.   Ears:  External ear exam shows no significant lesions or deformities.  Otoscopic examination reveals clear canals, tympanic membranes are intact bilaterally without bulging, retraction, inflammation or discharge. Hearing is grossly normal bilaterally. Mouth:  throat clear Neck:  supple and full ROM.   Lungs:  Normal  respiratory effort, chest expands symmetrically. Lungs are clear to auscultation, no crackles or wheezes. Heart:  normal rate, regular rhythm, and no JVD.   Abdomen:  soft and non-tender.   Msk:  no joint tenderness, no joint swelling, and no joint warmth.   Pulses:  2+ radial pulses Skin:  turgor normal and color normal.  Solar/actinic changes both arms. Cervical Nodes:  no anterior cervical adenopathy and no posterior cervical adenopathy.   Psych:  Oriented X3, memory intact for recent and remote, normally interactive, and good eye contact.     Impression & Recommendations:  Problem # 1:  URI (ICD-465.9) Partially treated URI with persistent cough.  Plan - z-pak           phenrgan with codeine 1 tsp q 6           benzonatate perles 181m three times a day.  His updated medication list for this problem includes:    Aspirin 81 Mg Tabs (Aspirin) .Marland Kitchen... Take one tablet once daily    Benzonatate 100 Mg Caps (Benzonatate) .Marland Kitchen... 1 by mouth three times a day x 10 days for cough    Promethazine Vc/codeine 6.25-5-10 Mg/78ml Syrp (Phenyleph-promethazine-cod) .Marland Kitchen... 1 tsp q6 as needed cough and congestion.  Complete Medication List: 1)  Altace 5 Mg Caps (Ramipril) .... Take 1 tablet by mouth once a day 2)  Atenolol 25 Mg Tabs (Atenolol) .... Take one tablet once daily 3)  Fluoxetine Hcl 20 Mg Caps (Fluoxetine hcl) .... Once daily 4)  Amaryl 1 Mg Tabs (Glimepiride) .... 2 daily 5)  Hydrochlorothiazide 25 Mg Tabs (Hydrochlorothiazide) .Marland Kitchen.. 1 by mouth once daily 6)  Nifedipine Cr Osmotic 30 Mg Tb24 (Nifedipine) .... Once daily 7)  Vytorin 10-40 Mg Tabs (Ezetimibe-simvastatin) .... Take 1 tablet by mouth once a day 8)  Aspirin 81 Mg Tabs (Aspirin) .... Take one tablet once daily 9)  Valium 2 Mg Tabs (Diazepam) .... Take 1 tablet by mouth two times a day 10)  Nortriptyline Hcl 10 Mg Caps (Nortriptyline hcl) .... Take 1 tab by mouth at bedtime 11)  Azithromycin 250 Mg Tabs (Azithromycin) .... 2 tabs  day #1, 1 tab once daily #2-5 12)  Benzonatate 100 Mg Caps (Benzonatate) .Marland Kitchen.. 1 by mouth three times a day x 10 days for cough 13)  Promethazine Vc/codeine 6.25-5-10 Mg/32ml Syrp (Phenyleph-promethazine-cod) .Marland Kitchen.. 1 tsp q6 as needed cough and congestion.  Patient Instructions: 1)  Upper respiratory infection partially treated with persistent cough. Plan - z-pak as dierected; benzonatate 100mg  three times a day for scratchy cough; phenergan with cdeine and phylephrine for deep cough with congestion. Hydrate. Tylenol for any fever.  Prescriptions: PROMETHAZINE VC/CODEINE 6.25-5-10 MG/5ML SYRP (PHENYLEPH-PROMETHAZINE-COD) 1 tsp q6 as needed cough and congestion.  #8 oz x 0   Entered and Authorized by:   Jacques Navy MD   Signed by:   Jacques Navy MD on 09/07/2010   Method used:   Print then Give to Patient   RxID:   [id number] BENZONATATE 100 MG CAPS (BENZONATATE) 1 by mouth three times a day x 10 days for cough  #30 x 1   Entered and Authorized by:  Jacques Navy MD   Signed by:   Jacques Navy MD on 09/07/2010   Method used:   Print then Give to Patient   RxID:   [id number] AZITHROMYCIN 250 MG TABS (AZITHROMYCIN) 2 tabs day #1, 1 tab once daily #2-5  #6 x 0   Entered and Authorized by:   Jacques Navy MD   Signed by:   Jacques Navy MD on 09/07/2010   Method used:   Print then Give to Patient   RxID:   [id number]    Orders Added: 1)  Est. Patient Level III [84696]

## 2010-11-17 NOTE — Progress Notes (Signed)
Summary: [facility]   Phone Note From Pharmacy   Caller: [phone] [facility] Ref # [id number] Summary of Call: [facility] is req a call regarding Vytorin.  Initial call taken by: Lamar Sprinkles, CMA,  March 27, 2010 10:18 AM  Follow-up for Phone Call        [facility] spoke with Alissa, and advised that patient does not take simvastatin 80mg  due to muscle weakness and fatigue. It is ok to release rx for vytorin 10-40mg , pt is aware.Marland KitchenMarland KitchenAlvy Beal Archie CMA  March 27, 2010 11:54 AM

## 2011-01-19 ENCOUNTER — Encounter (INDEPENDENT_AMBULATORY_CARE_PROVIDER_SITE_OTHER): Payer: Medicare Other | Admitting: Psychiatry

## 2011-01-19 DIAGNOSIS — F329 Major depressive disorder, single episode, unspecified: Secondary | ICD-10-CM

## 2011-01-19 LAB — BASIC METABOLIC PANEL
CO2: 28 mEq/L (ref 19–32)
Calcium: 8.5 mg/dL (ref 8.4–10.5)
Chloride: 103 mEq/L (ref 96–112)
Creatinine, Ser: 0.93 mg/dL (ref 0.4–1.5)
GFR calc Af Amer: >60 mL/min (ref >60)
GFR calc non Af Amer: >60 mL/min (ref >60)

## 2011-01-19 LAB — CBC
Platelets: 181 10*3/uL (ref 150–400)
RDW: 13.1 % (ref 11.5–15.5)
WBC: 10.2 10*3/uL (ref 4.0–10.5)

## 2011-01-19 LAB — GLUCOSE, CAPILLARY
Glucose-Capillary: 123 mg/dL — ABNORMAL HIGH (ref 70–99)
Glucose-Capillary: 183 mg/dL — ABNORMAL HIGH (ref 70–99)

## 2011-01-20 LAB — CBC
HCT: 47.1 % (ref 39.0–52.0)
Hemoglobin: 16.2 g/dL (ref 13.0–17.0)
Platelets: 228 10*3/uL (ref 150–400)
RBC: 4.95 MIL/uL (ref 4.22–5.81)
WBC: 7.6 10*3/uL (ref 4.0–10.5)

## 2011-01-20 LAB — COMPREHENSIVE METABOLIC PANEL
Albumin: 3.7 g/dL (ref 3.5–5.2)
Alkaline Phosphatase: 42 U/L (ref 39–117)
BUN: 25 mg/dL — ABNORMAL HIGH (ref 6–23)
CO2: 25 mEq/L (ref 19–32)
Chloride: 105 mEq/L (ref 96–112)
GFR calc non Af Amer: >60 mL/min (ref >60)
Glucose, Bld: 167 mg/dL — ABNORMAL HIGH (ref 70–99)
Potassium: 4.1 mEq/L (ref 3.5–5.1)
Total Bilirubin: 0.6 mg/dL (ref 0.3–1.2)

## 2011-01-20 LAB — BLOOD GAS, ARTERIAL
Acid-Base Excess: 2.2 mmol/L — ABNORMAL HIGH (ref 0.0–2.0)
Bicarbonate: 26.4 mEq/L — ABNORMAL HIGH (ref 20.0–24.0)
TCO2: 27.7 mmol/L (ref 0–100)
pCO2 arterial: 42 mmHg (ref 35.0–45.0)
pH, Arterial: 7.414 (ref 7.350–7.450)
pO2, Arterial: 76.3 mmHg — ABNORMAL LOW (ref 80.0–100.0)

## 2011-01-20 LAB — URINALYSIS, ROUTINE W REFLEX MICROSCOPIC
Bilirubin Urine: NEGATIVE
Hgb urine dipstick: NEGATIVE
Nitrite: NEGATIVE
Protein, ur: NEGATIVE mg/dL
Specific Gravity, Urine: 1.028 (ref 1.005–1.030)
Urobilinogen, UA: 0.2 mg/dL (ref 0.0–1.0)
pH: 6 (ref 5.0–8.0)

## 2011-01-20 LAB — GLUCOSE, CAPILLARY
Glucose-Capillary: 166 mg/dL — ABNORMAL HIGH (ref 70–99)
Glucose-Capillary: 178 mg/dL — ABNORMAL HIGH (ref 70–99)

## 2011-01-20 LAB — PROTIME-INR: INR: 1.04 (ref 0.00–1.49)

## 2011-01-20 LAB — URINE MICROSCOPIC-ADD ON

## 2011-01-22 ENCOUNTER — Other Ambulatory Visit: Payer: Self-pay | Admitting: Internal Medicine

## 2011-01-27 LAB — GLUCOSE, CAPILLARY
Glucose-Capillary: 147 mg/dL — ABNORMAL HIGH (ref 70–99)
Glucose-Capillary: 172 mg/dL — ABNORMAL HIGH (ref 70–99)

## 2011-02-10 ENCOUNTER — Other Ambulatory Visit: Payer: Self-pay | Admitting: Internal Medicine

## 2011-02-25 ENCOUNTER — Other Ambulatory Visit: Payer: Self-pay | Admitting: Vascular Surgery

## 2011-02-25 DIAGNOSIS — I714 Abdominal aortic aneurysm, without rupture: Secondary | ICD-10-CM

## 2011-03-02 NOTE — Assessment & Plan Note (Signed)
OFFICE VISIT   Barry [name], Barry [name]  DOB:  Mar 07, 1933                                       05/08/2010  JYNWG#:[id number]   Patient presents today for continued follow-up of stent graft repair of  his infrarenal abdominal aortic aneurysm.  The surgery was on  09/16/2009.  He continues to do quite well.  He has no physical  limitations and has had no new medical difficulties.  He does continue  to smoke 1 pack of cigarettes per day and does not drink alcohol.  He is  widowed with 2 children.  __________ treated for diabetes, hypertension,  and elevated cholesterol.  He does have no change in his otherwise  medical history or review of systems.   PHYSICAL EXAMINATION:  Blood pressure 153/77, pulse 83, respirations 18.  He is in no acute distress.  HEENT:  Normal.  Chest:  Clear bilaterally.  Heart:  Regular rate and rhythm.  Abdomen:  Soft, nontender.  I do not  palpate an aneurysm.  He has moderate obesity.  Musculoskeletal shows no  major deformities or cyanosis.  Neurologic:  No focal weakness or  paresthesias.  Skin without ulcers or rashes.  He does have 2-3+  posterior tibial pulses bilaterally and popliteal pulses bilaterally and  no evidence of false aneurysms in his groins point bilaterally.   He did undergo a CT scan today, and I have reviewed this independently  and discussed it with patient.  This shows excellent positioning of his  stent graft with no evidence of endoleak.  His maximal sac diameter is  unchanged at 5.6 cm.  He will continue his usual activity without  limitation.  We plan to see him again in 1 year with repeat stent graft  CT scan.     Larina Earthly, M.D.  Electronically Signed   TFE/MEDQ  D:  05/08/2010  T:  05/11/2010  Job:  [id number]   cc:   Rosalyn Gess. Norins, MD

## 2011-03-02 NOTE — Assessment & Plan Note (Signed)
OFFICE VISIT   Barry [name], ATTIA  DOB:  03-29-1933                                       08/08/2009  JXBJY#:[id number]   Debbora Lacrosse presents today for continued followup of his known  infrarenal asymptomatic abdominal aortic aneurysm.  He was last seen by  me with a CAT scan in September 2009.  At that time, its maximal size  was approximately 5.2 cm.  I explained that he was approaching the level  of suggested repair recommend continued ultrasound followup.  He  recently underwent repeat ultrasound at [facility] and this  shows a maximal diameter now of 5.5 cm.  A duplex ultrasound in that  office in March 2010 revealed a 5.1 cm aneurysm.  He continues to be  quite healthy at his age of 89.  He does not have any symptoms referable  to his aneurysm.  He has no abdominal or back pain associated with this.  He does have multiple medical issues and has stable diabetes under very  good control per Dr. Debby Bud.  He also has had adequate control of his  hypertension and elevated cholesterol.   REVIEW OF SYSTEMS:  Positive for depression and anxiety.  He denies any  anorexia, fever, weight loss.  No chest pain and no abdominal pain, and  no rash.   PAST HISTORY:  Significant for diabetes, hypertension, elevated  cholesterol.   FAMILY HISTORY:  Premature atherosclerotic disease.   SOCIAL HISTORY:  He widowed with 2 children.  He does smoke 1 pack of  cigarettes per day and does not drink alcohol on a regular basis.   PHYSICAL EXAM:  Well-nourished, well-nourished, white male appearing  stated age in no acute distress.  Neck:  Carotid arteries without bruits  bilaterally.  He does not have any thyroid or enlarged lymph nodes.  He  does not have any JVD.  Chest:  Clear to auscultation.  He has no  tachypnea.  Cardiovascular:  Heart has regular rate and rhythm.  His  radial, femoral, and posterior tibial pulses are 2+.  He has 2+  popliteal pulses  with no evidence of peripheral aneurysms.  Abdominal  exam:  He has normal bowel sounds.  He has mild obesity.  I do not feel  an aneurysm and he has no tenderness or masses.  Musculoskeletal:  No  deformities, no cyanosis, no clubbing.  Neurologically:  No focal  deficits.  Skin is warm and lesions or rashes.   I did review his ultrasound with Mr. Ramsburg and his son present.  I  also reviewed his prior CAT scan.  I explained that this showed that he  does indeed have an enlarging aneurysm and I have recommend a repeat CT  scan for imaging.  I explained that his study from just over a year ago  did show that he would be a candidate for stent grafting and would need  a current study with his enlargement to have proper sizing.  I did  review films and review his records from Dr. Debby Bud' office.  He does  appear to be an excellent candidate for surgery despite his age of 36.  We will discuss this further with Mr. Kozar once we have obtained a  new CT scan for sizing.   Larina Earthly, M.D.  Electronically Signed   TFE/MEDQ  D:  08/08/2009  T:  08/11/2009  Job:  [id number]   cc:   Rosalyn Gess. Norins, MD

## 2011-03-02 NOTE — Consult Note (Signed)
NEW PATIENT CONSULTATION   Barry [name], Barry [name]  DOB:  1933-05-11                                       07/10/2008  HYQMV#:[id number]   The patient presents today for evaluation of infrarenal abdominal aortic  aneurysm.  He is well known to me from a prior right fifth toe  amputation in 2005.  This was a presumed atheroembolic source.  He does  have a history of known small aneurysm and has been followed for some  time with serial ultrasound.  The initial study I have dates back to  2005 where his infrarenal aortic diameter was 3.7 cm maximally.  He has  had slow progression over time and his most recent study reveals a 5-cm  aneurysm.  He has no symptoms referable to this.   PAST HISTORY:  Significant for non-insulin-dependent diabetes,  gastroesophageal reflux, depression, hyperlipidemia, history of  hepatitis, hypertension.   ALLERGIES:  Lisinopril, hydrochlorothiazide, and antihistamines.   CURRENT MEDICATIONS:  Altace 5 mg 1 b.i.d.  Atenolol 25 mg 1 daily.  Fluoxetine 20 mg daily.  Amaryl 20 mg daily.  Hydrochlorothiazide 25 mg  daily.  Nifedipine CR 30 mg daily.  Vytorin 10/40 one daily.  Aspirin 81  mg 1 daily.  Librium 10 mg 1 to 2 tablets daily.  Meclizine 12.5 mg  q.i.d. as needed.   PHYSICAL EXAM:  Well-developed, well-nourished adult white male  appearing stated age of 72.  Blood pressure is 146/93, pulse 110,  respirations 18.  He is grossly intact neurologically.  His radial and  femoral pulses are 2+ bilaterally.  He has 2+ popliteal and 2+ dorsalis  pedis pulses without evidence of peripheral aneurysms.  Abdominal exam  reveals no tenderness.  He has an easily palpable infrarenal abdominal  aortic aneurysm.  Heart is regular rate and rhythm.  Chest is clear  bilaterally.  Carotids are without bruits bilaterally.   I had a long discussion with the patient and his family present.  He has  had progressive enlargement of his aneurysm with a growth  of 0.3 cm  since 6 months ago.  I have recommended CT scan for further evaluation  to determine if he is a stent graft candidate for better visualization  of his supra-aneurysm, aorta, and pelvic anatomy.  I discussed open  versus stent graft repair of aneurysms.  Also discussed that he, by  ultrasound, is at the threshold of recommendation for repair.  We will  schedule him for a contrast CT scan and discuss further options  following this study.  He understands that this may include continued  observation versus elective repair.   Larina Earthly, M.D.  Electronically Signed   TFE/MEDQ  D:  07/10/2008  T:  07/11/2008  Job:  [id number]   cc:   Rosalyn Gess. Norins, MD

## 2011-03-02 NOTE — Assessment & Plan Note (Signed)
OFFICE VISIT   DEVELL, PARKERSON  DOB:  1933-05-01                                       10/13/2009  JYNWG#:[id number]   The patient presents today for follow-up of his stent graft repair of 9  mm aortic aneurysm with a Gore excluder graft on 09/16/2009. He did well  in the hospital and was discharged home on postoperative day #1.  He has  had minimal discomfort following this procedure and has returned to his  usual baseline activities.  He did undergo CT scan today and I reviewed  this with him.  This shows excellent positioning of his stent graft,  no  change in the maximal diameter of his aneurysm at 5.6 cm and no evidence  of endoleak.  His groins are well-healed without any evidence of wound  problems.  He has normal distal pulses and has normal ankle arm index  bilaterally.  I am quite pleased with his initial result as is the  patient.  Of  note, he does report that his chronic back pain that he  had prior to the procedure has not been present since he had the stent  graft repair.  I explained this would be quite unusual if he has  symptomatic relief since the aneurysm would typically not cause back  pain unless it with rupturing which it clearly was not.  Nonetheless, he  reports that he is better.  He will continue his usual activities.  We  plan to see him again in 6 months with repeat CT scan at that time.     Larina Earthly, M.D.  Electronically Signed   TFE/MEDQ  D:  10/13/2009  T:  10/14/2009  Job:  [id number]   cc:   Rosalyn Gess. Norins, MD

## 2011-03-02 NOTE — Assessment & Plan Note (Signed)
OFFICE VISIT   AUBRA, PAPPALARDO  DOB:  [birth year]                                       07/17/2008  BJYNW#:[id number]   Patient presents today for continued discussion of his infrarenal  abdominal aortic aneurysm.  I had seen him last week with ultrasound  showing a progression in the size of his aneurysm.  He underwent CT  angio today for further evaluation of his aneurysm.  He is here today  with his children for further discussion.   I reviewed his CT scan with him.  This does show an infrarenal abdominal  aortic aneurysm.  Maximal diameter is approximately 5.2 cm.  He does  have some ectasia of his iliac artery with almost 2 cm iliac ectasia on  the right common iliac artery.  He does have some mural thrombus around  the aneurysm sac and also some thrombus near the renal artery take-offs.  This is not circumferential.  He does have a long infrarenal neck.   I discussed this with the patient and his family.  I explained that I  feel that he does have accessible anatomy for stent graft placement.  I  did explain that with his maximal diameter of 5.2 cm that I feel that  his six month interval risk for rupture equals his risk for mortality or  major morbidity.  Due to this, I have suggested that he continue  observation.  He understands with the 5.2 cm aneurysm that his annual  rupture risk is approximately 5%.  He understands and agrees with the  recommendation to continue observation.  I again reviewed symptoms of  leaking aneurysm with him, and he knows to report immediately to the  emergency room should this occur.  Otherwise, we will see him again in  six months for repeat duplex at that time.   Larina Earthly, M.D.  Electronically Signed   TFE/MEDQ  D:  07/17/2008  T:  07/18/2008  Job:  [id number]   cc:   Rosalyn Gess. Norins, MD

## 2011-03-02 NOTE — Assessment & Plan Note (Signed)
OFFICE VISIT   BOWMAN, HIGBIE  DOB:  1933-02-14                                       08/29/2009  ZOXWR#:[id number]   Here today for discussion of his recent CAT scan for planning for  endograft repair of his aneurysm.  He has been seen by me on 11/22 with  an extensive workup, and he has had no change in his health history  since that time, specifically no cardiac difficulties.   He is here today with his son.  I discussed his CT with him.  This does  show that he is a good candidate for stent graft repairing.  We have  discussed the procedure, including the expected 1-2-day hospitalization.  Also explained his post-hospital recovery expectation.  He understands.  We will proceed with surgery on 11/30 at [facility].   Larina Earthly, M.D.  Electronically Signed   TFE/MEDQ  D:  08/29/2009  T:  09/01/2009  Job:  [id number]

## 2011-03-03 ENCOUNTER — Other Ambulatory Visit: Payer: Self-pay | Admitting: Internal Medicine

## 2011-03-05 ENCOUNTER — Other Ambulatory Visit: Payer: Self-pay | Admitting: Internal Medicine

## 2011-03-05 NOTE — H&P (Signed)
NAMEBRYLEY, KOVACEVIC NO.:  [id number]   MEDICAL RECORD NO.:  [id number]          PATIENT TYPE:  INP   LOCATION:  A214                          FACILITY:  [facility]   PHYSICIAN:  Osvaldo Shipper, MD     DATE OF BIRTH:  [birth year]   DATE OF ADMISSION:  06/15/2005  DATE OF DISCHARGE:  LH                                HISTORY & PHYSICAL   PRIMARY CARE PHYSICIAN:  Rosalyn Gess. Norins, M.D. [facility]   ADMISSION DIAGNOSES:  1.  Chest pain, rule out acute coronary syndrome.  2.  Hypertension.  3.  Dyslipidemia.  4.  Gastroesophageal reflux disease.  5.  Anxiety disorder.  6.  Depression.   CHIEF COMPLAINT:  Chest pain.   HISTORY OF PRESENT ILLNESS:  The patient is a 77 year old Caucasian male  with a past history of high blood pressure, dyslipidemia, anxiety disorder,  depression, and GERD who presented to the ED with the onset of chest pain  this morning which was about 2/10.  The patient mentioned that he has been  having problems with indigestion for many years which is usually well-  controlled with Prilosec.  The patient has not been taking Prilosec for a  while and about a week and a half ago, he experienced acid reflux.  He has  been taking Rolaids with partial relief.  The patient does experience some  chest pain with his acid reflux disease.  However, this morning when he woke  up, the patient experienced retrosternal chest pain which was described as a  dull pain radiating to both sides of the chest.  He also experienced some  back pain with his symptoms.  The patient does have back problems in the  past and the pain that he had appeared similar to his previous symptoms.  The chest pain is currently about 1/10 in intensity.  He was given  nitroglycerin sublingually in the emergency room with slight relief of his  pain.  The pain does not radiate to his jaw or to his arms.  The patient did  not experience any palpitations, nausea, vomiting, shortness of breath, or  any light-headedness with these symptoms.  He said the pain appears to be  slightly better when he is sleeping on his side, otherwise, no other body  positions seem to help the pain.  The patient does not give history of  weight loss or dysphagia.  He says he had these symptoms about a year ago  when he underwent cardiac catheterization which did not reveal any  significant coronary artery disease apparently.  Currently, the patient is  very anxious and complains of 1/10 chest discomfort and is asking for  something to relieve his indigestion.   MEDICATIONS:  1.  Librium 10 mg p.r.n.  2.  Tenormin 50 mg daily.  3.  Procardia XL 30 mg daily.  4.  Altace 5 mg daily.  5.  Vytorin 10/40, one tablet daily.  6.  Hydrochlorothiazide 25 mg daily.  7.  Prozac 20 mg daily.  8.  He takes Advil and Rolaids on a p.r.n. basis, however, has not  been      taking too much Advil lately.  He did take two Advil when he had the      chest pain this morning with no relief.  9.  As mentioned before, he has been taking Prilosec in the past for his      acid reflux disease but has not been taking it until about 3 days ago.   ALLERGIES:  The patient is allergic to Sulfa and Zestoretic.   PAST MEDICAL HISTORY:  1.  Hypertension requiring multiple medications.  2.  Dyslipidemia.  3.  Depression with anxiety.  4.  History of cardiac catheterization a year ago which was done by Dr.      Juanda Chance from [facility] which showed  nonobstructive coronary      artery disease with 30% narrowing in the proximal left anterior      descending, 50% in the first diagonal branch off the LAD, 30% in the      marginal branch, 30% in the mid circumflex, and 80-90% in the small      right ventricular branch of the RCA.  The LVF was within normal limits.      He was found to have 80% left iliac stenosis at that time.   The above procedure was complicated by possibly cholesterol emboli resulting  in gangrene of his right  fifth toe which had to be amputated.   1.  The patient also has a distant history of kidney stones.  2.  He has never had an upper endoscopy.  He did have a colonoscopy 2-3      months ago which showed benign polyps.   SOCIAL HISTORY:  The patient lives alone in [place].  He is widowed.  He  is retired from the [employer].  He has a 50-pack year history of  smoking.  No alcohol or other illicit drug use.   FAMILY HISTORY:  His mother had history of coronary artery disease.  She  died of a heart attack many years ago.  There is a history of brain and  esophageal cancer in the family, otherwise, unremarkable.   REVIEW OF SYSTEMS:  A 10-point review of systems was done which was  unremarkable except as mentioned under the history of present illness.   PHYSICAL EXAMINATION:  VITAL SIGNS:  Temperature 97.3, blood pressure  166/78, heart rate 63, respiratory rate 16, saturating 97% on room air.  GENERAL:  This is an overweight individual in no acute distress.  HEENT:  No pallor or icterus.  Oral mucous membranes are moist.  No oral  lesions are seen.  NECK:  Soft and supple.  LUNGS:  Clear to auscultation bilaterally.  CARDIOVASCULAR:  S1 and S2 are normal.  Regular rhythm, no murmurs  appreciated.  No bruits heard.  No JVD is seen.  No pedal edema is present.  ABDOMEN:  Soft, nontender, non-distended.  Bowel sounds are present.  No  organomegaly or masses present.  CHEST WALL:  Nontender to palpation.  EXTREMITIES:  Good pulsations, no edema.  The right toe is missing  surgically.   LABORATORY DATA:  White count 11.2 with a slightly elevated neutrophil  count.  Hemoglobin 17.2, platelet count 261, MCV 93.  PT-INR is normal.  Sodium 133, potassium 3.6, chloride 98, bicarbonate 27, glucose 82, BUN 24,  creatinine 1.2, calcium 9.2.  LFT's not available.  Initial set of cardiac  enzymes are normal.  Chest x-ray was done which showed mild cardiomegaly, tortuous  aorta,  otherwise  no other abnormality.   EKG was done which showed sinus rhythm with a normal axis.  Intervals appear  to be within normal limits.  No Q-waves are seen.  ST segments are also  normal.   IMPRESSION:  This is a 77 year old Caucasian male with a past history of  hypertension who is a smoker who is slightly overweight, who also has a  history of dyslipidemia on treatment and presents with chest pain onset this  a.m.  The patient was having some indigestion with different chest symptoms  for a week prior to the onset of these symptoms this morning.  The patient  does have cardiac risk factors.  However, he did have cardiac  catheterization one year ago which did not show any obstructive coronary  artery disease.  Considering that the likelihood of this presentation being  secondary to coronary artery disease is on the lower side.  The patient does  have a history of acid reflux disease which could account for his symptoms.  The patient's pain is atypical for PE at this time, however, we will check a  D-dimer.   PLAN:  Chest pain.  It appears atypical for coronary artery disease.  We  will rule him out for acute coronary syndrome obtaining serial cardiac  enzymes.  We will obtain serial EKG's on this patient as well.  Will check a  D-dimer to rule out PE.  Consideration will be given to obtaining cardiology  consultation if the above tests are negative.  Will start the patient on PPI  at this time.  Will continue all of his blood pressure medications.   Further management decision will be based on results of initial testing and  the patient's response to treatment.      Osvaldo Shipper, MD  Electronically Signed     GK/MEDQ  D:  06/15/2005  T:  06/15/2005  Job:  [id number]   cc:   Rosalyn Gess. Norins, M.D. [facility]  [address]  [place]  [place] [address]

## 2011-03-05 NOTE — Discharge Summary (Signed)
Barry [name], Barry [name] NO.:  [id number]   MEDICAL RECORD NO.:  [id number]          PATIENT TYPE:  INP   LOCATION:  A214                          FACILITY:  [facility]   PHYSICIAN:  Osvaldo Shipper, MD     DATE OF BIRTH:  [birth year]   DATE OF ADMISSION:  06/15/2005  DATE OF DISCHARGE:  08/31/2006LH                                 DISCHARGE SUMMARY   DISCHARGE DIAGNOSES:  1.  Chest pain likely musculoskeletal versus gastroesophageal reflux      disease.  2.  Right upper and right lower lung nodules requiring further evaluation.  3.  A 2.5 cm right hepatic lesion requiring further evaluation.  4.  History of hypertension.  5.  History of anxiety and depression.   HISTORY OF PRESENT ILLNESS:  Please review the H&P dictated by myself for  details regarding the patient's presenting illness.   HOSPITAL COURSE:  Problem 1. Chest pain.  The patient is a 77 year old  Caucasian male with history of hypertension, dyslipidemia, GERD and anxiety  disorder who presented to the ED with onset of chest pain in the  retrosternal region.  The patient felt that this pain was different from his  usual acid reflux disease.  The patient also mentioned later during the  course of the admission that the Friday prior to this admission he had  fallen out of his car and had hurt his back during that process.  The  patient because of his risk factors was admitted to rule out for acute  coronary syndrome.  The patient did rule out for MI.  His EKG did not show  any changes suggestive of acute coronary syndrome.  Dr. Dietrich Pates from  cardiology saw the patient and no further evaluation was recommended. The  patient however continued to complain of chest pain along with back pain.  Since he was not very clear with his history, we recommended getting a CAT  scan of his chest.  The patient had experienced some allergic reactions to  IV dye in the past.  Hence, we had to premedicate him with prednisone  and  Benadryl over 13 hours.  The patient underwent a CAT scan of the chest  this  morning which ruled out XPE or any aortic dissection.  The patient was told  that the pain is likely secondary to musculoskeletal reasons or his acid  reflux disease.  He has been asked to continue taking his Prilosec.   He has been asked to followup with his primary doctor and maybe consider  referral to a gastroenterologist for an upper GI endoscopy considering  prolonged symptoms.  During the course of this admission the patient was  found to have bradycardia sinus with rates sometimes going down to the 30s.  The patient is on atenolol 15 mg and we have cut down the dose to 25.  On  the day of discharge the patient is asymptomatic, does not have any  shortness of breath or chest pain.  He feels well and is considered okay for  discharge.   Incidentally, when the CAT scan of the chest was  done the patient was found  to have multiple right upper lobe nodules which are very nonspecific as per  the radiologist.  A repeat CAT scan noncontrast recommended in three months  which he will be scheduled for the patient.   Again, incidentally a 2.5 cm right hepatic lesion was found on the liver  sections of the CAT scan.  An MR of the liver was recommended which again we  will schedule in the next week for this patient.  The results to be  forwarded to his PMD for further evaluation.   The patient has been told to follow with his findings from his CAT scan as  well as of his liver findings.  He has been told that he will need to come  back as an outpatient to undergo MR of his liver to further delineate this  lesion.  His LFT's of note have been normal in the hospital.   The patient has been asked to continue all of his other medications as  before except for the Tenormin which has been reduced to 25 mg daily.  He  has been prescribed Prilosec 20 mg once daily.   FOLLOW UP CARE:  1.  Dr. Rosalyn Gess. Norins,  M.D. [facility] in 1-2 weeks.  2.  MR of the abdomen, liver protocol, in one week. (06/24/2005)  3.  CT of the chest noncontrast to followup on the lung lesions in three      months.  4.  Carotid Dopplers on 06/24/2005   CONSULTATIONS:  Dr. Dietrich Pates from [facility] who did not recommend  further evaluation.   IMAGING STUDIES:  CAT scan of the chest as described above.  The patient also had chest x-ray done which did not show any acute  abnormality.  Suggested carotid bifurcation and calcifications.      Osvaldo Shipper, MD  Electronically Signed     GK/MEDQ  D:  06/17/2005  T:  06/17/2005  Job:  [id number]   cc:   Rosalyn Gess. Norins, M.D. [facility]  [address]  [place]  [place] [address]   [place] Bing, M.D. [facility]  [address]  [address]  [place]  [place] [address]

## 2011-03-05 NOTE — Consult Note (Signed)
NAME:  Barry [name], Barry [name] NO.:  [id number]   MEDICAL RECORD NO.:  [id number]          PATIENT TYPE:  INP   LOCATION:  A214                          FACILITY:  [facility]   PHYSICIAN:  [place] Bing, M.D. [facility] BIRTH:  1933-03-06   DATE OF CONSULTATION:  06/16/2005  DATE OF DISCHARGE:                                   CONSULTATION   CARDIOLOGY CONSULTATION:   DATE OF CONSULTATION:  June 16, 2005   REFERRING PHYSICIAN:  Dr. Rito Ehrlich   PRIMARY CARE PHYSICIAN:  Dr. Debby Bud   HISTORY OF PRESENT ILLNESS:  Request for consultation greatly appreciated  concerning this 77 year old gentleman admitted with chest pain. Barry [name]  has a long history of abdominal and chest discomfort that he attributes to  GERD. Symptoms have generally resolved after a week or two of treatment with  a PPI. His cardiac history dates to 2005 when he underwent coronary  angiography and was found to have insignificant disease except for a 90%  stenosis in a small branch of the right coronary artery. Peripheral vascular  disease was present including an 80% iliac stenosis. He had an abdominal  aortic aneurysm as well. After cath, he developed cholesterol emboli that  eventually required amputation of his fifth toe. He has done well since that  time with intermittent mild chest discomfort. He awoke on Monday morning  with a moderately severe pulling across the anterior chest that prompted him  to seek attention in the emergency department. In the few days prior to  that, he had epigastric and left abdominal discomfort that was more  consistent with his prior episodes of indigestion.  Since admission, his  chest discomfort has been more related to changes in the position of his  trunk than anything else.   There is a history of hyperlipidemia. He has had hypertension that has been  well controlled with medical therapy. He has had bradycardia on beta  blockers. He has not had diabetes.   PAST  MEDICAL HISTORY:  Past medical history is otherwise notable for anxiety  and depression and GERD.   ALLERGIES:  The patient reports allergies to SULFA DRUGS, ZESTORETIC and  INTRAVENOUS CONTRAST.   MEDICATIONS:  Recent medications include Librium 10 mg p.r.n. Tenormin 50 mg  daily, Procardia 30 mg daily, ramipril 5 mg daily, Vytorin 10/40 mg daily,  hydrochlorothiazide 25 mg daily, Prozac 20 mg daily, Advair and Rolaids  p.r.n.   SOCIAL HISTORY:  Retired from work in a [employer]; widowed and lives  alone in [place]. Fifty pack-year history of cigarette smoking; no  significant alcohol use.   FAMILY HISTORY:  Notable for coronary disease in both his mother and father.   REVIEW OF SYMPTOMS:  Notable for chronic back discomfort; otherwise  negative.   EXAMINATION:  GENERAL:  On exam, pleasant garrulous gentleman in no acute  distress.  VITAL SIGNS:  The temperature is 98, heart rate 50 and regular, respirations  20, blood pressure 145/80, O2 saturation 100% on room air.  HEENT:  Anicteric sclerae; normal lids and conjunctivae; pupils equal,  round, react to light.  NECK:  No jugular venous distension; normal carotid upstrokes without  bruits.  ENDOCRINE:  No thyromegaly.  HEMATOPOIETIC:  No adenopathy.  SKIN:  No significant lesions.  CARDIAC:  Normal first and second heart sounds; modest systolic ejection  murmur at the left sternal border.  LUNGS:  Clear. Chest is resonant to percussion.  ABDOMEN:  Soft and nontender; no masses; no organomegaly; normal bowel  sounds; no bruits.  EXTREMITIES:  Decreased distal pulses on the right; no edema.  NEUROMUSCULAR:  Symmetric strength and tone; normal cranial nerves.  PSYCHIATRIC:  Alert and oriented; normal affect.   LABORATORIES:  EKG: Sinus bradycardia; T-wave flattening; otherwise  unremarkable. Other laboratory notable for normal CBC, normal chemistry  profile, three sets of normal cardiac markers, normal LFTs and a normal  D-  dimer.   IMPRESSION:  Mr. Garciagarcia presents with atypical chest discomfort. This is  more suggestive of a musculoskeletal etiology than of coronary ischemia. At  this point, myocardial infarction is ruled out. I do not think that further  testing for heart disease would be productive. There is no cardiac reason  for prolonging his hospital stay. He should go home with a prescription for  nitroglycerin, if none is available to him.   Significant sinus bradycardia is noted in the hospital. Although he is  asymptomatic, it would be would be reasonable to decrease his dose of  Tenormin to 25 mg daily.   We greatly appreciate the request for consultation and will be happy to  follow this gentleman subsequent to hospital discharge.      [place] Bing, M.D. [facility]  Electronically Signed     RR/MEDQ  D:  06/16/2005  T:  06/16/2005  Job:  [id number]

## 2011-03-05 NOTE — Op Note (Signed)
NAMECAMRON, [name] NO.:  [id number]   MEDICAL RECORD NO.:  [id number]          PATIENT TYPE:  OIB   LOCATION:  2892                         FACILITY:  [facility]   PHYSICIAN:  Larina Earthly, M.D.    DATE OF BIRTH:  1932-12-13   DATE OF PROCEDURE:  07/06/2004  DATE OF DISCHARGE:  07/06/2004                                 OPERATIVE REPORT   PREOPERATIVE DIAGNOSIS:  Gangrene, right fifth toe.   POSTOPERATIVE DIAGNOSIS:  Gangrene, right fifth toe.   PROCEDURE:  Right fifth toe amputation.   SURGEON:  Larina Earthly, M.D.   ASSISTANT:  Nurse.   ANESTHESIA:  Ankle block.   COMPLICATIONS:  None.   DISPOSITION:  To the recovery room stable.   DESCRIPTION OF PROCEDURE:  The patient was taken to the operating room and  placed in the supine position where the area of the right foot was prepped  and draped in the usual sterile fashion.  A fish-mouth incision was made at  the base of the right fifth toe.  This was carried down to the level of the  metatarsal phalangeal joint.  The joint was disarticulated and the specimen  was passed off the field.  The metatarsal head was removed with rongeurs.  The wound was irrigated with saline.  No further necrotic tissue was noted.  The patient had had an atheroemboli to his foot.  The wound was irrigated  with saline.  Hemostasis was obtained with electrocautery.  The wound was  closed with #3-0 nylon mattress sutures.  A sterile dressing was applied.  The patient was taken to the recovery room in stable condition.      Todd   TFE/MEDQ  D:  07/06/2004  T:  07/07/2004  Job:  [id number]

## 2011-03-05 NOTE — Discharge Summary (Signed)
NAME:  Barry [name], Barry [name] NO.:  [id number]   MEDICAL RECORD NO.:  [id number]                   PATIENT TYPE:  INP   LOCATION:  2041                                 FACILITY:  [facility]   PHYSICIAN:  Guy Franco, P.A. [facility]                DATE OF BIRTH:  06-10-1933   DATE OF ADMISSION:  03/12/2004  DATE OF DISCHARGE:  03/16/2004                           DISCHARGE SUMMARY - REFERRING   DISCHARGE DIAGNOSES:  1. Chest pain, resolved.  2. Nonobstructive coronary artery disease.  3. Peripheral vascular disease.  4. Abdominal aortic aneurysm (small) per angiogram.  5. Sinus bradycardia (decreased dose of beta blocker).  6. Hypertension, treated.  7. Hyperlipidemia, treated.  8. Depression, treated.  9. Anxiety, treated.  10.      Gastroesophageal reflux disease, treated.  11.      Nephrolithiasis.  12.      Borderline diabetes, diet controlled.   HOSPITAL COURSE:  The patient is a 77 year old male patient with a known  history of coronary artery disease who presented to [facility] emergency room complaining of chest pain.  Point of care markers  were initially negative, however, the third set revealed a troponin of 0.08.  He was then transferred to [facility] for cardiac catheterization.  Eventually, he underwent cardiac catheterization on Mar 16, 2004, and was  found to have LAD with a 30% proximal lesion with calcifications, circumflex  with 30% mid lesion, RCA 90% small RV lesion, LV was normal.  There was a  small AAA noted in the distal aorta.  There was bilateral lower extremity  peripheral vascular disease with a 40% right femoral artery stenosis and an  80% left.   At this point, the patient will be discharged to home and will be discharged  on the following medications.   DISCHARGE MEDICATIONS:  1. Enteric-coated aspirin 325 mg a day.  2. Maxzide daily.  3. Librium 10 mg a day.  4. Crestor 10 mg q.h.s.  5. Resume  Prilosec and Prozac.  6. ACE 5 mg b.i.d.  7. Atenolol 50 mg 1/2 tablet daily. This is a decreased dose from his usual     50 mg a day secondary to his bradycardia during his hospitalization.   ACTIVITY:  No straining.  No strenuous activity or lifting over 10 pounds  for one week.   DIET:  Remain on a low fat diet.   WOUND CARE:  Clean the catheterization site with soap and water, no  scrubbing or tube bathing.   He is to call for any questions or concerns and the office will call him  with an appointment this week.   The patient will need to have his lower extremity vascular disease followed  through our clinic.  Guy Franco, P.A. [facility]    LB/MEDQ  D:  03/16/2004  T:  03/16/2004  Job:  [id number]   cc:   Learta Codding, M.D. [facility]   Rosalyn Gess. Norins, M.D. [facility]

## 2011-03-05 NOTE — Telephone Encounter (Signed)
Rx Done. [last OV 08/2010]

## 2011-03-05 NOTE — H&P (Signed)
NAME:  Barry [name], Barry [name]                       ACCOUNT NO.:  [id number]   MEDICAL RECORD NO.:  [id number]                   PATIENT TYPE:  INP   LOCATION:  2041                                 FACILITY:  [facility]   PHYSICIAN:  Learta Codding, M.D. [facility]             DATE OF BIRTH:  1933/01/12   DATE OF ADMISSION:  03/12/2004  DATE OF DISCHARGE:                                HISTORY & PHYSICAL   CHIEF COMPLAINT:  Chest pain.   HISTORY OF PRESENT ILLNESS:  Barry [name] is a very pleasant 77 year old male  patient followed in primary care by Dr. Rosalyn Gess. Norins, with no known  history of coronary artery disease, who presented to [facility] Emergency  Room this evening with complaints of epigastric and substernal chest  discomfort.  He began having recurrent chest symptoms Monday of this week.  This was very reminiscent of his previous acid reflux symptoms.  He took  some Pepcid with relief and restarted his Prilosec daily.  He had no  recurrent discomfort, up until yesterday.  Yesterday, after eating a meal,  he began to have more epigastric and substernal chest discomfort.  He took  another Pepcid.  His pain was relieved after belching.  Today, he was still  concerned about his symptoms and decided to go to the [facility] Emergency  Room for further evaluation.  He had point-of-care enzymes performed which  were negative x3, except for his third troponin was mildly elevated at 0.08.  He had recurrence of chest discomfort while in the emergency department.  He  was given GI cocktail as well as 2 nitroglycerin before his pain went away.  He is currently pain-free.  He is transferred for further evaluation.   PAST MEDICAL HISTORY:  Past medical history is significant for:  1. Hypertension.  2. Treated hypercholesterolemia.  3. Depression/anxiety.  4. GERD.  5. Nephrolithiasis.  6. He has been told he has borderline diabetes mellitus and treats this with     diet only.  7. He notes having  a stress test in March of 2005 which was negative per his     report.   ALLERGIES:  SULFA.   MEDICATIONS:  1. Atenolol 50 mg daily.  2. Maxzide 25 mg a day.  3. __________ 10 mg daily.  4. Crestor 10 mg nightly.  5. Fluoxetine 20 mg every other day.  6. Prilosec daily.  7. He also notes that he thinks he is taking Procardia but does not know the     dosage.   SOCIAL HISTORY:  The patient lives in [place].  He is a widower.  He is  retired from the [employer].  He smokes 1 pack per day and has done so  for the last 50 years.  He denies any alcohol abuse.   FAMILY HISTORY:  Family history is significant for coronary artery disease.  His mother had a myocardial infarction in  her 62s; she died of a brain  aneurysm.  His father died of complications from COPD.   REVIEW OF SYSTEMS:  Please see HPI.  He denies fevers, chills, sweats,  headaches, sore throat, rash, urinary urgency, frequency or dysuria.  He  does have occasional anxiety for which he takes Librium.  He denies any  myalgias, arthralgias, nausea, vomiting, diarrhea, bright red blood per  rectum or melena.  He denies any skin changes.  He denies any shortness of  breath, dyspnea on exertion, orthopnea, paroxysmal nocturnal dyspnea, edema  or syncope.  He has chest pains as noted above.  The rest of the review of  systems are negative.   PHYSICAL EXAM:  GENERAL:  He is well-developed, well-nourished, in no acute  distress.  VITAL SIGNS:  Blood pressure is 125/66, pulse 49, respirations 20,  temperature 97.2, oxygen saturation 96%.  HEENT:  Head normocephalic, atraumatic.  Eyes:  PERRLA.  EOMI.  Sclerae are  clear.  NECK:  Neck supple without lymphadenopathy, thyromegaly, bruits or JVD.  LYMPH:  Without lymphadenopathy.  CARDIAC:  Regular rate and rhythm with normal S1 and S2.  No murmurs, rubs,  clicks or gallops.  Vascular exam without carotid bruits.  Femoral and  arterial pulses are 2+ bilaterally without  bruits.  LUNGS:  Lungs are clear to auscultation bilaterally.  SKIN:  Skin without rashes.  ABDOMEN:  Abdomen soft, nontender with normoactive bowel sounds.  No  hepatosplenomegaly.  EXTREMITIES:  Extremities without clubbing, cyanosis, or edema.  MUSCULOSKELETAL:  Musculoskeletal without spine or CVA tenderness.  NEUROLOGICAL:  Alert and oriented x3, nonfocal.   ACCESSORY CLINICAL DATA:  Chest x-ray per records from [facility]  shows no acute disease.   EKG reveals sinus bradycardia with a heart rate of 52 and poor R wave  progression.   LABORATORY DATA:  Hemoglobin 17.3, hematocrit 49.7, white count 9100,  platelet count 246,000.  Sodium 137, potassium 4, BUN 19, creatinine 1.2,  glucose 146.  Normal LFTs; total protein is 7, albumin 3.7.  Point-of-care  markers as noted above.  INR 0.9.   IMPRESSION:  1. Chest pain.  2. Gastroesophageal reflux disease.  3. Hypertension.  4. Treated hypercholesterolemia.  5. Borderline diabetes mellitus, type 2.  6. Tobacco abuse.  7. Depression/anxiety.   PLAN:  The patient's symptoms are very atypical for coronary ischemia.  However, he has had several visits to the emergency room in the last year  for this same symptom.  The patient requests further evaluation to include  cardiac catheterization.  Therefore, the patient will be set up for cardiac  catheterization tomorrow.  Risks and benefits have  been explained to him and he agrees to proceed.  We will treat him with  aspirin, beta blocker, Lovenox and p.r.n. nitroglycerin.  The patient was  also seen and examined by Dr. Learta Codding today, who formulated the above  plan.      Barry [name], P.A.                        Learta Codding, M.D. [facility]    SW/MEDQ  D:  03/12/2004  T:  03/14/2004  Job:  [id number]   cc:   Rosalyn Gess. Norins, M.D. [facility]

## 2011-03-05 NOTE — Cardiovascular Report (Signed)
NAME:  Barry [name], Barry [name]                       ACCOUNT NO.:  [id number]   MEDICAL RECORD NO.:  [id number]                   PATIENT TYPE:  INP   LOCATION:  2041                                 FACILITY:  [facility]   PHYSICIAN:  Charlies Constable, M.D. [facility]              DATE OF BIRTH:  Jul 19, 1933   DATE OF PROCEDURE:  03/16/2004  DATE OF DISCHARGE:                              CARDIAC CATHETERIZATION   INDICATIONS:  Barry [name] is 77 years old and lives in [place].  He  retired from [employer] and is seen by Dr. Debby Bud at [facility].  He has  multiple risk factors including hypertension, hyperlipidemia, and borderline  diabetes and smoking.  He has had atypical symptoms which have sounded more  like reflux, but these have required repeated visits and he was seen in the  [facility] emergency room and had a borderline troponin.  For this reason he  was admitted and scheduled for angiography.   PROCEDURE:  The procedure was performed via the right femoral artery using  an arterial sheath and 6 French preformed coronary catheters.  A front wall  arterial puncture was performed and Omnipaque contrast was used.  Distal  aortogram was performed to rule out abdominal aortic aneurysm.  The patient  tolerated the procedure well and left the laboratory in satisfactory  condition.   RESULTS:  1. Left main coronary artery:  The left main coronary artery was free of     significant disease.  2. Left anterior descending artery:  The left anterior descending artery     gives rise to a large diagonal branch and four septal perforators.  The     LAD was irregular, and there was moderate calcification.  There was 30%     narrowing in the proximal vessel.  There was 50% narrowing in the first     diagonal branch.  3. Circumflex artery:  The circumflex artery gave rise to a small ramus     branch, a marginal branch, and two posterolateral branches and an atrial     branch.  This vessel was irregular.  There  was 30% narrowing in the mid-     circumflex, and there was 30% narrowing in the marginal branch.  4. Right coronary artery:  The right coronary artery is a small to moderate-     sized vessel that gave rise to a right ventricular branch, a posterior     descending branch, and a small posterolateral branch.  There was 80-90%     stenosis in the small right ventricular branch.  5. Left ventriculogram:  The left ventriculogram performed in the RAO     projection showed good wall motion but no areas of hypokinesis.  The     estimated ejection fraction was 60%.  6. Distal aortogram:  A distal aortogram was performed, which showed patent     renal arteries.  There is a small distal  abdominal aortic aneurysm.     There was a 40% stenosis of the right iliac at the ostium and 80%     stenosis of the left iliac at the ostium.   The aortic pressure is 164/83 with a mean of 115.  Left ventricular pressure  is 164/9.   CONCLUSION:  1. Nonobstructive coronary artery disease with 30% narrowing in the proximal     left anterior descending coronary artery, 50% narrowing in the first     diagonal branch of the left anterior descending artery, 30% narrowing in     the marginal branch, and 30% narrowing in the midcircumflex artery and 80     and 90% stenosis in a small right ventricular branch of the right     coronary artery, with normal left ventricular function.  2. Small distal abdominal aortic aneurysm and 80% left iliac stenosis.   RECOMMENDATIONS:  In view of these findings, I doubt the patient's recent  symptoms are related to myocardial ischemia.  Will plan continued secondary  risk factor modification and work on smoking cessation.  He has back pain  with exercise, which may or may not be related to his left iliac stenosis.  Will obtain peripheral Dopplers and PV consult.  Will also size the aneurysm  with ultrasound.                                               Charlies Constable, M.D.  [facility]    BB/MEDQ  D:  03/16/2004  T:  03/16/2004  Job:  [id number]

## 2011-03-05 NOTE — Assessment & Plan Note (Signed)
[facility]                         GASTROENTEROLOGY OFFICE NOTE   Barry [name], Barry [name] Barry [name]                    MRN:          [id number]  DATE:11/23/2006                            DOB:          October 21, 1932    Barry [name] has had 13 polyps removed, all adenomatous, from his colon  over the last 2 years.  His last colonoscopy done on November 09, 2006 he  had 3 polyps.  I plan for a repeat exam in 2 years.  I brought him to  discuss the possibility of genetic testing today.  He has heard the  situation and what is available as far as genetic testing.  I think he  could potentially have a form of attenuated familial adenomatous  polypous.  It is possible a blood test would help sort that out.  Right  now he says he has to many bills to pay and is not willing in  interesting in doing that.  His son did have a colonoscopy a year ago  and is apparently supposed to have one again in March (in [place]).  His son would like to see me and I told him he could call and make a  appointment and to the bring the records.  It may benefit his son more  to consider the genetic testing and I have given Barry [name] the  handouts on this and I will review with his son as well.     Iva Boop, MD,FACG  Electronically Signed    CEG/MedQ  DD: 11/23/2006  DT: 11/23/2006  Job #: [id number]

## 2011-03-25 ENCOUNTER — Encounter: Payer: Self-pay | Admitting: Internal Medicine

## 2011-03-26 ENCOUNTER — Telehealth: Payer: Self-pay | Admitting: *Deleted

## 2011-03-26 ENCOUNTER — Ambulatory Visit (AMBULATORY_SURGERY_CENTER): Payer: Medicare Other | Admitting: *Deleted

## 2011-03-26 ENCOUNTER — Encounter: Payer: Self-pay | Admitting: Internal Medicine

## 2011-03-26 ENCOUNTER — Ambulatory Visit (INDEPENDENT_AMBULATORY_CARE_PROVIDER_SITE_OTHER): Payer: Medicare Other | Admitting: Internal Medicine

## 2011-03-26 VITALS — Ht 70.0 in | Wt 210.8 lb

## 2011-03-26 DIAGNOSIS — E119 Type 2 diabetes mellitus without complications: Secondary | ICD-10-CM

## 2011-03-26 DIAGNOSIS — K219 Gastro-esophageal reflux disease without esophagitis: Secondary | ICD-10-CM

## 2011-03-26 DIAGNOSIS — I10 Essential (primary) hypertension: Secondary | ICD-10-CM

## 2011-03-26 DIAGNOSIS — Z86010 Personal history of colonic polyps: Secondary | ICD-10-CM

## 2011-03-26 DIAGNOSIS — J069 Acute upper respiratory infection, unspecified: Secondary | ICD-10-CM

## 2011-03-26 DIAGNOSIS — E785 Hyperlipidemia, unspecified: Secondary | ICD-10-CM

## 2011-03-26 MED ORDER — AMOXICILLIN-POT CLAVULANATE 875-125 MG PO TABS: 1.0000 | ORAL_TABLET | Freq: Two times a day (BID) | ORAL | Status: AC

## 2011-03-26 MED ORDER — AMOXICILLIN-POT CLAVULANATE 400-57 MG PO CHEW: 1.0000 | CHEWABLE_TABLET | Freq: Two times a day (BID) | ORAL | Status: DC

## 2011-03-26 MED ORDER — PEG-KCL-NACL-NASULF-NA ASC-C 100 G PO SOLR: ORAL | Status: DC

## 2011-03-26 NOTE — Telephone Encounter (Signed)
Pharm has liquid in same strength and pt has agreed to liquid. Per MD, lower dose was rx'd in error. Called and corrected at pharm and in chart.

## 2011-03-26 NOTE — Telephone Encounter (Signed)
Prescribing error by MEN: should  Be 875 bid x 7 days.

## 2011-03-26 NOTE — Telephone Encounter (Signed)
PATIENT HAD COLONOSCOPY 2010 USING MIRALAX PREP, REPORT STATES FAIR RESULTS WILL NEED MORE AGGRESSIVE PREP NEXT TIME. I GAVE PATIENT MOVIPREP INSTRUCTIONS DURING PREVISIT. DO YOU WANT TO ADD ANYTHING ELSE? Sherren Kerns

## 2011-03-26 NOTE — Telephone Encounter (Signed)
Joni Reining from [facility] called there chewable amoxicillin that was sent in is no longer made. What do you advise.

## 2011-03-27 NOTE — Progress Notes (Signed)
  Subjective:    Patient ID: Barry [name], male    DOB: 10/21/1932, 77 y.o.   MRN: [id number]  HPI Barry [name] presents c/o recurrent sinus symptoms with congestion, rhinorrhea and post-nasal drainage. He was treated at an Urgent Care about a month ago. He denies SOB/DOE, no N/V. He admits to headache and mild dizziness.  PMH, FamHx and SocHx reviewed for any changes and relevance.    Review of Systems Review of Systems  Constitutional:  Negative for fever, chills, activity change and unexpected weight change.  HENT:  Negative for hearing loss, ear pain neck stiffness. Positive for rhinorrhea, headache.  Eyes: Negative for pain, discharge and visual disturbance.  Respiratory: Negative for chest tightness and wheezing.   Cardiovascular: Negative for chest pain and palpitations.       [No decreased exercise tolerance Gastrointestinal: [No change in bowel habit. No bloating or gas. No reflux or indigestion Genitourinary: Negative for urgency, frequency, flank pain and difficulty urinating.  Musculoskeletal: Negative for myalgias, back pain, arthralgias and gait problem.  Neurological: Negative for dizziness, tremors, weakness and headaches.  Hematological: Negative for adenopathy.  Psychiatric/Behavioral: Negative for behavioral problems and dysphoric mood.       Objective:   Physical Exam Vitals noted. Gen'l WNWD older white male in no distress HEENT - minimal tenderness to percussion over the frontal sinus. Throat clear Neck - supple Nodes - negative submandibular and cervical regions Chest - CTAP Cor- RRR       Assessment & Plan:  1. URI - patient with mild recurrent symptoms  Plan - Augmentin 875mg  bid x 7 days           Usual supportive care.  2. Health maintenance - he has multiple co-morbidities and is due for routine labs (which he declines today) and a physical exam - he is scheduled for early September '12

## 2011-03-29 NOTE — Telephone Encounter (Signed)
MoviPrep is more aggressive. Nothing more needed.

## 2011-04-13 ENCOUNTER — Other Ambulatory Visit: Payer: Self-pay | Admitting: Internal Medicine

## 2011-04-23 ENCOUNTER — Telehealth: Payer: Self-pay | Admitting: Internal Medicine

## 2011-04-26 NOTE — Telephone Encounter (Signed)
No charge. 

## 2011-04-27 ENCOUNTER — Other Ambulatory Visit: Payer: Medicare Other | Admitting: Internal Medicine

## 2011-04-27 NOTE — Telephone Encounter (Signed)
Patient NOT BILLED.

## 2011-05-04 ENCOUNTER — Ambulatory Visit (INDEPENDENT_AMBULATORY_CARE_PROVIDER_SITE_OTHER): Payer: Medicare Other | Admitting: Vascular Surgery

## 2011-05-04 ENCOUNTER — Ambulatory Visit
Admission: RE | Admit: 2011-05-04 | Discharge: 2011-05-04 | Disposition: A | Discharge status: Home / Self Care | Payer: Medicare Other | Source: Ambulatory Visit | Attending: Vascular Surgery | Admitting: Vascular Surgery

## 2011-05-04 DIAGNOSIS — I714 Abdominal aortic aneurysm, without rupture, unspecified: Secondary | ICD-10-CM

## 2011-05-04 MED ORDER — IOHEXOL 350 MG/ML SOLN
100.0000 mL | Freq: Once | INTRAVENOUS | Status: AC | PRN
  Administered 2011-05-04: 100 mL via INTRAVENOUS

## 2011-05-05 NOTE — Assessment & Plan Note (Signed)
OFFICE VISIT  Barry [name], AHN DOB:  11-23-1932                                       05/04/2011 EAVWU#:[id number]  Patient presents today for followup of a stent graft repair of infrarenal abdominal aortic aneurysm with a Gore excluder stent graft. Surgery was on 09/16/09.  He continues to do quite well.  He has had no new major medical difficulties.  He did have a recent back strain and is recovering from this.  He is quite active, mowing and doing activities around the house.  He does smoke less than a pack of cigarettes per day.  PHYSICAL EXAMINATION:  A well-developed and well-nourished white male appearing stated age in no acute distress.  Blood pressure is 186/88, pulse 81, respirations 20.  HEENT is normal.  His abdomen is soft, nontender.  I do not feel an aortic pulsation.  His femoral pulses are 2+.  He has 2+ popliteal and 2+ posterior tibial pulses. Musculoskeletal shows no major deformities or cyanosis.  Neurologic:  No focal weakness or paresthesias.  Skin without ulcers or rashes.  He did undergo a CT scan today, and I have reviewed his images and discussed these with patient.  This shows no evidence of endoleak with excellent positioning of his graft.  His maximal diameter of his aneurysm has shrunk down to approximately 5 cm.  I am quite pleased with his result and recommend that we see him again in 1 year with repeat CT scan at that time.    Larina Earthly, M.D. Electronically Signed  TFE/MEDQ  D:  05/04/2011  T:  05/05/2011  Job:  [id number]  cc:   Rosalyn Gess. Norins, MD

## 2011-05-20 ENCOUNTER — Encounter (INDEPENDENT_AMBULATORY_CARE_PROVIDER_SITE_OTHER): Payer: Medicare Other | Admitting: Psychiatry

## 2011-05-20 DIAGNOSIS — F411 Generalized anxiety disorder: Secondary | ICD-10-CM

## 2011-05-27 ENCOUNTER — Ambulatory Visit (AMBULATORY_SURGERY_CENTER): Payer: Medicare Other | Admitting: Internal Medicine

## 2011-05-27 ENCOUNTER — Encounter: Payer: Self-pay | Admitting: Internal Medicine

## 2011-05-27 VITALS — BP 141/75 | HR 56 | Temp 97.7°F | Resp 18 | Ht 70.0 in | Wt 215.0 lb

## 2011-05-27 DIAGNOSIS — Z1211 Encounter for screening for malignant neoplasm of colon: Secondary | ICD-10-CM

## 2011-05-27 DIAGNOSIS — K5730 Diverticulosis of large intestine without perforation or abscess without bleeding: Secondary | ICD-10-CM

## 2011-05-27 DIAGNOSIS — D1330 Benign neoplasm of unspecified part of small intestine: Secondary | ICD-10-CM

## 2011-05-27 DIAGNOSIS — Z86010 Personal history of colonic polyps: Secondary | ICD-10-CM

## 2011-05-27 DIAGNOSIS — D126 Benign neoplasm of colon, unspecified: Secondary | ICD-10-CM

## 2011-05-27 DIAGNOSIS — K648 Other hemorrhoids: Secondary | ICD-10-CM

## 2011-05-27 MED ORDER — SODIUM CHLORIDE 0.9 % IV SOLN: 500.0000 mL | INTRAVENOUS | Status: DC

## 2011-05-27 NOTE — Patient Instructions (Addendum)
Four small (tiny) polyps were removed today. Should be able to wait at least 3 years if not longer for next colonoscopy. I will send you a letter. You still have diverticulosis and hemorrhoids. Iva Boop, MD, [facility]  FOLLOW DISCHARGE INSTRUCTIONS FOR DIET & ACTIVITY( BLUE & GREEN SHEETS)

## 2011-05-28 ENCOUNTER — Telehealth: Payer: Self-pay | Admitting: *Deleted

## 2011-05-28 NOTE — Telephone Encounter (Signed)

## 2011-06-06 ENCOUNTER — Encounter: Payer: Self-pay | Admitting: Internal Medicine

## 2011-06-06 NOTE — Progress Notes (Signed)
Quick Note:  Diminutive adenomas and serrated adenomas, # 3 Diverticulosis Hemorrhoids Prior adenomas (numerous) Repeat routine colonoscopy in 3 years if doing well at age 77 ______

## 2011-06-28 ENCOUNTER — Other Ambulatory Visit (INDEPENDENT_AMBULATORY_CARE_PROVIDER_SITE_OTHER): Payer: Medicare Other

## 2011-06-28 DIAGNOSIS — I10 Essential (primary) hypertension: Secondary | ICD-10-CM

## 2011-06-28 DIAGNOSIS — E119 Type 2 diabetes mellitus without complications: Secondary | ICD-10-CM

## 2011-06-28 DIAGNOSIS — E785 Hyperlipidemia, unspecified: Secondary | ICD-10-CM

## 2011-06-28 LAB — LIPID PANEL
LDL Cholesterol: 48 mg/dL (ref 0–99)
Total CHOL/HDL Ratio: 3
VLDL: 20.4 mg/dL (ref 0.0–40.0)

## 2011-06-28 LAB — HEMOGLOBIN A1C: Hgb A1c MFr Bld: 7.5 % — ABNORMAL HIGH (ref 4.6–6.5)

## 2011-06-28 LAB — COMPREHENSIVE METABOLIC PANEL
ALT: 17 U/L (ref 0–53)
AST: 18 U/L (ref 0–37)
Albumin: 3.8 g/dL (ref 3.5–5.2)
Alkaline Phosphatase: 45 U/L (ref 39–117)
Calcium: 9 mg/dL (ref 8.4–10.5)
Chloride: 104 mEq/L (ref 96–112)
Potassium: 4.5 mEq/L (ref 3.5–5.1)
Sodium: 140 mEq/L (ref 135–145)

## 2011-06-28 LAB — HEPATIC FUNCTION PANEL
ALT: 17 U/L (ref 0–53)
AST: 18 U/L (ref 0–37)
Total Bilirubin: 0.5 mg/dL (ref 0.3–1.2)
Total Protein: 6.8 g/dL (ref 6.0–8.3)

## 2011-06-30 ENCOUNTER — Encounter: Payer: Medicare Other | Admitting: Internal Medicine

## 2011-07-05 ENCOUNTER — Encounter: Payer: Self-pay | Admitting: Internal Medicine

## 2011-07-12 ENCOUNTER — Ambulatory Visit (INDEPENDENT_AMBULATORY_CARE_PROVIDER_SITE_OTHER): Payer: Medicare Other | Admitting: Internal Medicine

## 2011-07-12 ENCOUNTER — Encounter: Payer: Self-pay | Admitting: Internal Medicine

## 2011-07-12 VITALS — BP 142/70 | HR 64 | Temp 97.5°F | Wt 206.0 lb

## 2011-07-12 DIAGNOSIS — Z23 Encounter for immunization: Secondary | ICD-10-CM

## 2011-07-12 DIAGNOSIS — Z0000 Encounter for general adult medical examination without abnormal findings: Secondary | ICD-10-CM

## 2011-07-12 DIAGNOSIS — Z136 Encounter for screening for cardiovascular disorders: Secondary | ICD-10-CM

## 2011-07-12 DIAGNOSIS — F17200 Nicotine dependence, unspecified, uncomplicated: Secondary | ICD-10-CM

## 2011-07-12 DIAGNOSIS — I10 Essential (primary) hypertension: Secondary | ICD-10-CM

## 2011-07-12 DIAGNOSIS — E119 Type 2 diabetes mellitus without complications: Secondary | ICD-10-CM

## 2011-07-12 DIAGNOSIS — R42 Dizziness and giddiness: Secondary | ICD-10-CM

## 2011-07-12 DIAGNOSIS — E785 Hyperlipidemia, unspecified: Secondary | ICD-10-CM

## 2011-07-12 NOTE — Progress Notes (Signed)
Subjective:    Patient ID: Barry [name], male    DOB: 1933-08-18, 77 y.o.   MRN: [id number]  HPI  The patient is here for annual Medicare wellness examination and management of other chronic and acute problems. He has had some trouble with blood sugar control. If he gets to 100 or less on CBG he gets shaky and nervous.    The risk factors are reflected in the social history.  The roster of all physicians providing medical care to patient - is listed in the Snapshot section of the chart.  Activities of daily living:  The patient is 100% inedpendent in all ADLs: dressing, toileting, feeding as well as independent mobility  Home safety : The patient has smoke detectors in the home. They wear seatbelts. Firearms are present in the home, not kept in a safe fashion. There is no violence in the home.   There is no risks for hepatitis, STDs or HIV. There is no history of blood transfusion. They have no travel history to infectious disease endemic areas of the world.  The patient has seen their dentist in the last six month. They have seen their eye doctor in the last year. They admit to any hearing difficulty and have not had audiologic testing in the last year.  They do not  have excessive sun exposure. Discussed the need for sun protection: hats, long sleeves and use of sunscreen if there is significant sun exposure.   Diet: the importance of a healthy diet is discussed. They do have a unhealthy-high fat/fast food diet.  The patient does not have a regular exercise program:.  The benefits of regular aerobic exercise were discussed.  Depression screen: there are no signs or vegative symptoms of depression- irritability, change in appetite, anhedonia, sadness/tearfullness.  Cognitive assessment: the patient manages all their financial and personal affairs and is actively engaged. They could relate day,date,year and events; recalled 3/3 objects at 3 minutes; performed clock-face test  normally.  The following portions of the patient's history were reviewed and updated as appropriate: allergies, current medications, past family history, past medical history,  past surgical history, past social history  and problem list.  Vision, hearing, body mass index were assessed and reviewed.   During the course of the visit the patient was educated and counseled about appropriate screening and preventive services including : fall prevention , diabetes screening, nutrition counseling, colorectal cancer screening, and recommended immunizations.  Past Medical History  Diagnosis Date  . GERD (gastroesophageal reflux disease)   . Depression   . Tobacco abuse   . Hyperlipidemia   . Diabetes mellitus   . Allergy     rhinitis  . Hepatitis   . Hypertension   . PVD (peripheral vascular disease)   . AAA (abdominal aortic aneurysm) 3-09    4.7 cm  . Diverticulosis   . Personal history of colonic polyps     adenomas, serrated also   Past Surgical History  Procedure Date  . Surgical excision basal cell carcinoma   . Lower limb amputation, other toe 5th   . Tympanic eardrum repair   . Percutaneous stent graft repair of infrarenal aaa 11/10    5.6 cm (T. early)  . Colonoscopy 2006, 2008, 2010, 05/27/2011    numerous adenomas - 13 in 2006, 3, 2008, 2 2012 (up to 1 cm), 4 diminutive adenomas, serrated adenomas 2012. Diverticulosis and hemorrhoids also.   Family History  Problem Relation Age of Onset  . Emphysema Father   .  Cancer Brother     Lung  . Kidney disease Sister   . Colon cancer Neg Hx    History   Social History  . Marital Status: Widowed    Spouse Name: N/A    Number of Children: N/A  . Years of Education: N/A   Occupational History  . Not on file.   Social History Main Topics  . Smoking status: Current Everyday Smoker -- 0.5 packs/day for 60 years    Types: Cigarettes  . Smokeless tobacco: Never Used  . Alcohol Use: No  . Drug Use: No  . Sexually Active:  Not on file   Other Topics Concern  . Not on file   Social History Narrative  . No narrative on file      Review of Systems Review of Systems  Constitutional:  Negative for fever, chills, activity change and unexpected weight change.  HEENT:  Negative for hearing loss, ear pain, congestion, neck stiffness and postnasal drip. Negative for sore throat or swallowing problems. Negative for dental complaints.   Eyes: Negative for vision loss or change in visual acuity.  Respiratory: Negative for chest tightness and wheezing.   Cardiovascular: Negative for chest pain and palpitation. No decreased exercise tolerance Gastrointestinal: No change in bowel habit. No bloating or gas. No reflux or indigestion Genitourinary: Negative for urgency, frequency, flank pain and difficulty urinating.  Musculoskeletal: Negative for myalgias, back pain, arthralgias and gait problem.  Neurological: Negative for dizziness, tremors, weakness and headaches.  Hematological: Negative for adenopathy.  Psychiatric/Behavioral: Negative for behavioral problems and dysphoric mood.       Objective:   Physical Exam Vital signs reviewed Gen'l: Well nourished well developed white male in no acute distress  HEENT: Head: Normocephalic and atraumatic. Right Ear: External ear normal. EAC/TM nl. Left Ear: External ear normal.  EAC/TM nl. Nose: Nose normal. Mouth/Throat: Oropharynx is clear and moist. Dentition - native, in good repair. No buccal or palatal lesions. Posterior pharynx clear. Eyes: Conjunctivae and sclera clear. EOM intact. Pupils are equal, round, and reactive to light. Right eye exhibits no discharge. Left eye exhibits no discharge. Neck: Normal range of motion. Neck supple. No JVD present. No tracheal deviation present. No thyromegaly present.  Cardiovascular: Normal rate, regular rhythm, no gallop, no friction rub, no murmur heard.      Quiet precordium. 2+ radial and DP pulses . No carotid  bruits Pulmonary/Chest: Effort normal. No respiratory distress or increased WOB, no wheezes, no rales. No chest wall deformity or CVAT. Abdominal: Soft. Bowel sounds are normal in all quadrants. He exhibits no distension, no tenderness, no rebound or guarding, No heptosplenomegaly  Genitourinary:   Musculoskeletal: Normal range of motion. He exhibits no edema and no tenderness. Missing tright 5th toe.      Small and large joints without redness, synovial thickening or deformity. Full range of motion preserved about all small, median and large joints.  Lymphadenopathy:    He has no cervical or supraclavicular adenopathy.  Neurological: He is alert and oriented to person, place, and time. CN II-XII intact. DTRs 2+ and symmetrical biceps, radial and patellar tendons. Cerebellar function normal with no tremor, rigidity, normal gait and station. right foot - normal sensation to light touch and pin prick, diminished deep vibratory sensation c/w neuropathy. Skin: Skin is warm and dry. No rash noted. No erythema.  Psychiatric: He has a normal mood and affect. His behavior is normal. Thought content normal.   Lab Results  Component Value Date  WBC 10.2 09/17/2009   HGB 14.2 09/17/2009   HCT 41.5 09/17/2009   PLT 181 09/17/2009   CHOL 113 06/28/2011   TRIG 102.0 06/28/2011   HDL 44.60 06/28/2011   LDLDIRECT 152.6 05/28/2008   ALT 17 06/28/2011   ALT 17 06/28/2011   AST 18 06/28/2011   AST 18 06/28/2011   NA 140 06/28/2011   K 4.5 06/28/2011   CL 104 06/28/2011   CREATININE 1.0 06/28/2011   BUN 24* 06/28/2011   CO2 30 06/28/2011   TSH 1.75 06/28/2011   PSA 0.23 05/28/2008   INR 1.04 09/12/2009   HGBA1C 7.5* 06/28/2011       Glucose                  167        Assessment & Plan:

## 2011-07-13 NOTE — Assessment & Plan Note (Signed)
Will have intermittent episodes of dizziness responsive to meclizine. He is advised to continue taking medication prn.

## 2011-07-13 NOTE — Assessment & Plan Note (Signed)
Continues to smoke despite CAD, PVD and DM. He is again informed of risks and need to stop smoking. He voices understanding of the risks but remains in the precontemplative state. He is directly asked to stop smoking.

## 2011-07-13 NOTE — Assessment & Plan Note (Signed)
A1C above goal at 7.5%  Plan- continue present medictions          Improved dietary adherence          Repeat A1C in 6 months.

## 2011-07-13 NOTE — Assessment & Plan Note (Signed)
BP Readings from Last 3 Encounters:  07/12/11 142/70  05/27/11 141/75  03/26/11 178/90   Borderline control.  Plan - continue present medications           Increase walking and reduce salt in diet.

## 2011-07-13 NOTE — Assessment & Plan Note (Signed)
Excellent control with present medications  Plan - continue the same meds

## 2011-07-13 NOTE — Assessment & Plan Note (Signed)
Interval medical history is benign. Exam is notable for peripheral neuropathy otherwise normal. Lab results with good control of lipids, suboptimal control of diabetes otherwise normal values. He is current with colorectal cancer screening. Deferred prostate screening due to age. Cardiac stable. Immunizations: due for flu shot and should consider shingles vaccine. 12 lead EKG is normal with no signs of ischemia or injury.  In summary - a nice man who is medically stable. He is instructed to continue to work on diet mgt of his diabetes. He is adamantly encouraged to stop smoking. He will return in 6 months for A1C monitoring.

## 2011-07-17 ENCOUNTER — Other Ambulatory Visit: Payer: Self-pay | Admitting: Internal Medicine

## 2011-07-23 LAB — DIFFERENTIAL
Basophils Absolute: 0.1
Basophils Relative: 1
Eosinophils Absolute: 0.1
Neutro Abs: 4.3
Neutrophils Relative %: 59

## 2011-07-23 LAB — CBC
MCHC: 35
MCV: 92.5
Platelets: 241

## 2011-07-23 LAB — POCT I-STAT CREATININE
Creatinine, Ser: 1
Creatinine, Ser: 1.3
Operator id: 294521

## 2011-07-23 LAB — I-STAT 8, (EC8 V) (CONVERTED LAB)
Acid-Base Excess: 3 — ABNORMAL HIGH
Acid-Base Excess: 4 — ABNORMAL HIGH
BUN: 18
Bicarbonate: 26.2 — ABNORMAL HIGH
Bicarbonate: 29.5 — ABNORMAL HIGH
Chloride: 103
HCT: 51
Hemoglobin: 17.3 — ABNORMAL HIGH
Operator id: 217071
Operator id: 294521
Sodium: 137
TCO2: 31
pCO2, Ven: 35.8 — ABNORMAL LOW
pCO2, Ven: 46.7
pH, Ven: 7.408 — ABNORMAL HIGH

## 2011-08-09 ENCOUNTER — Other Ambulatory Visit: Payer: Self-pay | Admitting: Internal Medicine

## 2011-08-10 NOTE — Telephone Encounter (Signed)
Ok for 90 with 3 refills

## 2011-08-12 ENCOUNTER — Encounter (INDEPENDENT_AMBULATORY_CARE_PROVIDER_SITE_OTHER): Payer: Medicare Other | Admitting: Psychiatry

## 2011-08-12 ENCOUNTER — Telehealth: Payer: Self-pay | Admitting: *Deleted

## 2011-08-12 DIAGNOSIS — F329 Major depressive disorder, single episode, unspecified: Secondary | ICD-10-CM

## 2011-08-12 NOTE — Telephone Encounter (Signed)
Patient requesting a call back regarding [facility] RF request.

## 2011-08-13 NOTE — Telephone Encounter (Signed)
k

## 2011-08-13 NOTE — Telephone Encounter (Signed)
Spoke with pt, he is unsure as to which medication are due for a refill, He will contact [facility] and have them fax over refill request

## 2011-09-19 ENCOUNTER — Other Ambulatory Visit: Payer: Self-pay | Admitting: Internal Medicine

## 2011-10-02 ENCOUNTER — Other Ambulatory Visit (HOSPITAL_COMMUNITY): Payer: Self-pay | Admitting: Psychiatry

## 2011-10-25 ENCOUNTER — Other Ambulatory Visit: Payer: Self-pay | Admitting: *Deleted

## 2011-10-25 MED ORDER — ATENOLOL 25 MG PO TABS: 25.0000 mg | ORAL_TABLET | Freq: Every day | ORAL | Status: DC

## 2011-10-25 MED ORDER — EZETIMIBE-SIMVASTATIN 10-40 MG PO TABS: 1.0000 | ORAL_TABLET | Freq: Every day | ORAL | Status: DC

## 2011-10-25 MED ORDER — NIFEDIPINE ER OSMOTIC RELEASE 30 MG PO TB24: 30.0000 mg | ORAL_TABLET | Freq: Every day | ORAL | Status: DC

## 2011-10-25 MED ORDER — HYDROCHLOROTHIAZIDE 25 MG PO TABS: 25.0000 mg | ORAL_TABLET | Freq: Every day | ORAL | Status: DC

## 2011-10-25 MED ORDER — RAMIPRIL 5 MG PO CAPS: 5.0000 mg | ORAL_CAPSULE | Freq: Every day | ORAL | Status: DC

## 2011-10-25 NOTE — Telephone Encounter (Signed)
Request 90-day supply of med refills for: HCTZ; Atenolol; Nifedipine; Vytorin; Ramipiril Done to [facility] as requested by patient. Informed patient.

## 2011-10-27 MED ORDER — ATENOLOL 25 MG PO TABS: 25.0000 mg | ORAL_TABLET | Freq: Every day | ORAL | Status: DC

## 2011-10-27 MED ORDER — EZETIMIBE-SIMVASTATIN 10-40 MG PO TABS: 1.0000 | ORAL_TABLET | Freq: Every day | ORAL | Status: DC

## 2011-10-27 MED ORDER — NIFEDIPINE ER OSMOTIC RELEASE 30 MG PO TB24: 30.0000 mg | ORAL_TABLET | Freq: Every day | ORAL | Status: DC

## 2011-10-27 MED ORDER — HYDROCHLOROTHIAZIDE 25 MG PO TABS: 25.0000 mg | ORAL_TABLET | Freq: Every day | ORAL | Status: DC

## 2011-10-27 MED ORDER — RAMIPRIL 5 MG PO CAPS: 5.0000 mg | ORAL_CAPSULE | Freq: Every day | ORAL | Status: DC

## 2011-10-27 NOTE — Telephone Encounter (Signed)
Addended by: Regis Bill on: 10/27/2011 11:42 AM   Modules accepted: Orders

## 2011-10-27 NOTE — Telephone Encounter (Signed)
Received Rx Error notice on escripts to [facility]. New prescriptions need to be hard copy--printed for MD signature to fax.

## 2011-11-04 ENCOUNTER — Encounter (HOSPITAL_COMMUNITY): Payer: Medicare Other | Admitting: Psychiatry

## 2011-11-04 ENCOUNTER — Ambulatory Visit (HOSPITAL_COMMUNITY): Payer: Medicare Other | Admitting: Psychiatry

## 2011-11-04 ENCOUNTER — Ambulatory Visit (INDEPENDENT_AMBULATORY_CARE_PROVIDER_SITE_OTHER): Payer: Medicare Other | Admitting: Psychiatry

## 2011-11-04 DIAGNOSIS — F329 Major depressive disorder, single episode, unspecified: Secondary | ICD-10-CM

## 2011-11-04 MED ORDER — DIAZEPAM 2 MG PO TABS: 2.0000 mg | ORAL_TABLET | Freq: Two times a day (BID) | ORAL | Status: DC

## 2011-11-04 NOTE — Progress Notes (Signed)
Patient came for his followup appointment. He has been stable on his medication. He denies any anxiety depression or nervousness. He is sleeping good. He has a good Christmas. Since he is taking nortriptyline he does not have any headache. He denies any crying spells agitation or anger. He reported no side effects of medication.  Mental status examination Patient is well groomed well dressed. He maintained good eye contact. His his speech is soft clear and coherent. His thought process is logical linear and goal-directed. He denies any active or passive suicidal thoughts or homicidal thoughts. There no psychotic symptoms present. He's alert and oriented x3. His insight judgment and impulse control is okay.  Assessment Depressive disorder NOS  Plan I will continue his Valium 2 mg twice a day and nortriptyline 10 mg at bedtime. He is also taking Prozac from his primary care physician. I have explained risks and benefits of medication in detail and recommended to call if he has any question or concern about the medication or if he feel worsening of the symptoms. I will see him again in 3 months

## 2011-12-30 ENCOUNTER — Other Ambulatory Visit (HOSPITAL_COMMUNITY): Payer: Self-pay | Admitting: Psychiatry

## 2011-12-30 DIAGNOSIS — F329 Major depressive disorder, single episode, unspecified: Secondary | ICD-10-CM

## 2011-12-31 ENCOUNTER — Other Ambulatory Visit (HOSPITAL_COMMUNITY): Payer: Self-pay | Admitting: *Deleted

## 2011-12-31 DIAGNOSIS — F3289 Other specified depressive episodes: Secondary | ICD-10-CM

## 2011-12-31 DIAGNOSIS — F329 Major depressive disorder, single episode, unspecified: Secondary | ICD-10-CM

## 2011-12-31 MED ORDER — NORTRIPTYLINE HCL 10 MG PO CAPS: 10.0000 mg | ORAL_CAPSULE | Freq: Every day | ORAL | Status: DC

## 2011-12-31 NOTE — Progress Notes (Signed)
Addended by: Kathryne Sharper T on: 12/31/2011 10:39 AM   Modules accepted: Orders

## 2012-02-01 ENCOUNTER — Encounter (HOSPITAL_COMMUNITY): Payer: Self-pay | Admitting: Psychiatry

## 2012-02-01 ENCOUNTER — Ambulatory Visit (INDEPENDENT_AMBULATORY_CARE_PROVIDER_SITE_OTHER): Payer: Medicare Other | Admitting: Psychiatry

## 2012-02-01 DIAGNOSIS — F329 Major depressive disorder, single episode, unspecified: Secondary | ICD-10-CM

## 2012-02-01 MED ORDER — DIAZEPAM 2 MG PO TABS: 2.0000 mg | ORAL_TABLET | Freq: Two times a day (BID) | ORAL | Status: DC

## 2012-02-01 NOTE — Progress Notes (Signed)
Chief complaint Medication management and followup.  History of present illness Patient is a 78 year old Caucasian male who came for his followup appointment.  He has been compliant and stable on his medication.  He denies any recent panic attack or nervousness.  He denies any crying spells or depressive thoughts.  His social in his life.  His sleep is good and his appetite is unchanged.  He likes his current psychiatric medication.  He takes Valium twice a day.  He does not abuse his his Valium or ask for early refill.  He does not drink or use any illegal substance.    Current psychiatric medication Valium 2 mg twice a day Nortriptyline 10 mg daily Prozac 20 mg daily which is prescribed by primary care physician  Medical history Patient has history of diabetes mellitus, hypertension, peripheral vascular disease, abdominal aortic aneurysm, diverticulosis, GERD and hyperlipidemia.  He sees Dr. Filbert Schilder regularly and recently had blood work which he reports to the normal limit.  Mental status examination Patient is well groomed well dressed.  He is pleasant, calm and cooperative.  He maintained a good eye contact. His speech is soft clear and coherent. His thought process is logical linear and goal-directed. He denies any active or passive suicidal thoughts or homicidal thoughts.  He denies any auditory or visual hallucination.  He described his mood is good and his affect is mood congruent.  There no psychotic symptoms present. He's alert and oriented x3. His insight judgment and impulse control is okay.  Assessment Axis I Depressive disorder NOS Axis II deferred Axis III see medical history Axis IV mild to moderate Axis V 65-70  Plan I will continue his Valium 2 mg twice a day and nortriptyline 10 mg at bedtime. He is also taking Prozac from his primary care physician. I have explained risks and benefits of medication in detail and recommended to call if he has any question or concern about  the medication or if he feel worsening of the symptoms. I will see him again in 3 months

## 2012-02-02 ENCOUNTER — Other Ambulatory Visit: Payer: Self-pay | Admitting: *Deleted

## 2012-02-02 DIAGNOSIS — I714 Abdominal aortic aneurysm, without rupture: Secondary | ICD-10-CM

## 2012-02-02 DIAGNOSIS — Z48812 Encounter for surgical aftercare following surgery on the circulatory system: Secondary | ICD-10-CM

## 2012-03-10 ENCOUNTER — Other Ambulatory Visit: Payer: Self-pay | Admitting: *Deleted

## 2012-03-10 MED ORDER — GLIMEPIRIDE 1 MG PO TABS: ORAL_TABLET | ORAL | Status: DC

## 2012-03-14 ENCOUNTER — Other Ambulatory Visit (HOSPITAL_COMMUNITY): Payer: Self-pay | Admitting: Psychiatry

## 2012-03-14 DIAGNOSIS — F329 Major depressive disorder, single episode, unspecified: Secondary | ICD-10-CM

## 2012-03-14 MED ORDER — NORTRIPTYLINE HCL 10 MG PO CAPS: 10.0000 mg | ORAL_CAPSULE | Freq: Every day | ORAL | Status: DC

## 2012-04-05 ENCOUNTER — Other Ambulatory Visit: Payer: Self-pay | Admitting: *Deleted

## 2012-04-05 MED ORDER — ATENOLOL 25 MG PO TABS: 25.0000 mg | ORAL_TABLET | Freq: Every day | ORAL | Status: DC

## 2012-04-05 MED ORDER — HYDROCHLOROTHIAZIDE 25 MG PO TABS: 25.0000 mg | ORAL_TABLET | Freq: Every day | ORAL | Status: DC

## 2012-04-05 NOTE — Telephone Encounter (Signed)
Will fax Rx for tenormin to mail order pharmacy.

## 2012-04-05 NOTE — Telephone Encounter (Signed)
Refill Rx sent to [facility] for atenolol and HCTZ. With note of need to make appt. With Dr. Debby Bud for further refills

## 2012-04-07 DIAGNOSIS — H35379 Puckering of macula, unspecified eye: Secondary | ICD-10-CM | POA: Diagnosis not present

## 2012-04-26 DIAGNOSIS — H2510 Age-related nuclear cataract, unspecified eye: Secondary | ICD-10-CM | POA: Diagnosis not present

## 2012-04-26 DIAGNOSIS — H35379 Puckering of macula, unspecified eye: Secondary | ICD-10-CM | POA: Diagnosis not present

## 2012-04-26 DIAGNOSIS — H35439 Paving stone degeneration of retina, unspecified eye: Secondary | ICD-10-CM | POA: Diagnosis not present

## 2012-04-26 DIAGNOSIS — H43819 Vitreous degeneration, unspecified eye: Secondary | ICD-10-CM | POA: Diagnosis not present

## 2012-05-01 ENCOUNTER — Encounter: Payer: Self-pay | Admitting: Vascular Surgery

## 2012-05-02 ENCOUNTER — Other Ambulatory Visit: Payer: Self-pay

## 2012-05-02 ENCOUNTER — Ambulatory Visit (HOSPITAL_COMMUNITY): Payer: Self-pay | Admitting: Psychiatry

## 2012-05-02 ENCOUNTER — Ambulatory Visit: Payer: Self-pay | Admitting: Vascular Surgery

## 2012-05-04 ENCOUNTER — Encounter (HOSPITAL_COMMUNITY): Payer: Self-pay | Admitting: Psychiatry

## 2012-05-04 ENCOUNTER — Ambulatory Visit (INDEPENDENT_AMBULATORY_CARE_PROVIDER_SITE_OTHER): Payer: Medicare Other | Admitting: Psychiatry

## 2012-05-04 DIAGNOSIS — F329 Major depressive disorder, single episode, unspecified: Secondary | ICD-10-CM

## 2012-05-04 DIAGNOSIS — F3289 Other specified depressive episodes: Secondary | ICD-10-CM

## 2012-05-04 MED ORDER — DIAZEPAM 2 MG PO TABS: 2.0000 mg | ORAL_TABLET | Freq: Two times a day (BID) | ORAL | Status: DC

## 2012-05-04 NOTE — Progress Notes (Signed)
Chief complaint Medication management and followup.  History of present illness Patient is a 78 year old Caucasian male who came for his followup appointment.  He has been compliant with his medication.  He denies any recent panic attack or nervousness.  His headache is much control with nortriptyline.  He denies any recent crying spells.  He denies any social isolation .  He has been busy doing yard work .  He denies any side effects of medication including any tremors or shakes.  He is not asking for early refills of his Valium.  He's not drinking or using any illegal substance.    Current psychiatric medication Valium 2 mg twice a day Nortriptyline 10 mg daily Prozac 20 mg daily which is prescribed by primary care physician  Medical history Patient has history of diabetes mellitus, hypertension, peripheral vascular disease, abdominal aortic aneurysm, diverticulosis, GERD and hyperlipidemia.  He sees Dr. Filbert Schilder regularly and recently had blood work which he reports to the normal limit.  His hemoglobin A1c is 7.5 which was done in September 2012.  However he has been checking his blood sugar at home and reported that blood sugar is been getting better.  Mental status examination Patient is well groomed well dressed.  He is pleasant, calm and cooperative.  He maintained a good eye contact. His speech is soft clear and coherent. His thought process is logical linear and goal-directed. He denies any active or passive suicidal thoughts or homicidal thoughts.  He denies any auditory or visual hallucination.  He described his mood is good and his affect is mood congruent.  There no psychotic symptoms present. He's alert and oriented x3. His insight judgment and impulse control is okay.  Assessment Axis I Depressive disorder NOS Axis II deferred Axis III see medical history Axis IV mild to moderate Axis V 65-70  Plan I will continue his Valium 2 mg twice a day and nortriptyline 10 mg at bedtime. He  is also taking Prozac from his primary care physician. I have explained risks and benefits of medication in detail and recommended to call if he has any question or concern about the medication or if he feel worsening of the symptoms. I will see him again in 3 months

## 2012-05-05 ENCOUNTER — Other Ambulatory Visit: Payer: Self-pay | Admitting: *Deleted

## 2012-05-05 MED ORDER — EZETIMIBE-SIMVASTATIN 10-40 MG PO TABS: 1.0000 | ORAL_TABLET | Freq: Every day | ORAL | Status: DC

## 2012-05-05 NOTE — Telephone Encounter (Signed)
Patient refills sent to [facility]. Patient need to schedule appt. Dr. Debby Bud for further refills

## 2012-05-10 DIAGNOSIS — I714 Abdominal aortic aneurysm, without rupture: Secondary | ICD-10-CM | POA: Diagnosis not present

## 2012-05-15 ENCOUNTER — Encounter: Payer: Self-pay | Admitting: Vascular Surgery

## 2012-05-16 ENCOUNTER — Ambulatory Visit (INDEPENDENT_AMBULATORY_CARE_PROVIDER_SITE_OTHER): Payer: Medicare Other | Admitting: Vascular Surgery

## 2012-05-16 ENCOUNTER — Encounter: Payer: Self-pay | Admitting: Vascular Surgery

## 2012-05-16 ENCOUNTER — Ambulatory Visit
Admission: RE | Admit: 2012-05-16 | Discharge: 2012-05-16 | Disposition: A | Discharge status: Home / Self Care | Payer: Medicare Other | Source: Ambulatory Visit | Attending: Vascular Surgery | Admitting: Vascular Surgery

## 2012-05-16 VITALS — BP 177/73 | HR 66 | Resp 18 | Ht 71.0 in | Wt 205.8 lb

## 2012-05-16 DIAGNOSIS — Z48812 Encounter for surgical aftercare following surgery on the circulatory system: Secondary | ICD-10-CM

## 2012-05-16 DIAGNOSIS — I714 Abdominal aortic aneurysm, without rupture: Secondary | ICD-10-CM

## 2012-05-16 DIAGNOSIS — I701 Atherosclerosis of renal artery: Secondary | ICD-10-CM | POA: Diagnosis not present

## 2012-05-16 DIAGNOSIS — I719 Aortic aneurysm of unspecified site, without rupture: Secondary | ICD-10-CM | POA: Diagnosis not present

## 2012-05-16 DIAGNOSIS — K8020 Calculus of gallbladder without cholecystitis without obstruction: Secondary | ICD-10-CM | POA: Diagnosis not present

## 2012-05-16 MED ORDER — IOHEXOL 350 MG/ML SOLN
80.0000 mL | Freq: Once | INTRAVENOUS | Status: AC | PRN
  Administered 2012-05-16: 80 mL via INTRAVENOUS

## 2012-05-16 NOTE — Progress Notes (Signed)
The patient has today for followup of stent graft repair of abdominal aortic aneurysm. His surgery was in November of 2010 for a 5.6 cm infrarenal abdominal aortic aneurysm. He has no new medical complaints. 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 He has no new cardiac difficulties. He has no symptoms were called to his aneurysm. Past Medical History  Diagnosis Date  . GERD (gastroesophageal reflux disease)   . Depression   . Tobacco abuse   . Hyperlipidemia   . Diabetes mellitus   . Allergy     rhinitis  . Hepatitis   . Hypertension   . PVD (peripheral vascular disease)   . AAA (abdominal aortic aneurysm) 3-09    4.7 cm  . Diverticulosis   . Personal history of colonic polyps     adenomas, serrated also    History  Substance Use Topics  . Smoking status: Current Everyday Smoker -- 1.0 packs/day for 60 years    Types: Cigarettes  . Smokeless tobacco: Never Used  . Alcohol Use: No    Family History  Problem Relation Age of Onset  . Emphysema Father   . Cancer Brother     Lung  . Kidney disease Sister   . Depression Sister   . Colon cancer Neg Hx     Allergies  Allergen Reactions  . Lisinopril-Hydrochlorothiazide Swelling    ZESTRIL    Current outpatient prescriptions:aspirin 81 MG tablet, Take 81 mg by mouth daily.  , Disp: , Rfl: ;  atenolol (TENORMIN) 25 MG tablet, Take 1 tablet (25 mg total) by mouth daily. Patient last office vistit 07/12/2011. Need to make appt. Soon to see Dr. Debby Bud for further refill., Disp: 90 tablet, Rfl: 0;  diazepam (VALIUM) 2 MG tablet, Take 1 tablet (2 mg total) by mouth 2 (two) times daily., Disp: 60 tablet, Rfl: 2 ezetimibe-simvastatin (VYTORIN) 10-40 MG per tablet, Take 1 tablet by mouth at bedtime. Patient needs to call and schedule appt. With Dr, Debby Bud for further refills. Last OV 06/2011, Disp: 90 tablet, Rfl: 1;  FLUoxetine (PROZAC) 20 MG capsule, TAKE 1 CAPSULE DAILY, Disp: 90 capsule, Rfl: 4;  glimepiride  (AMARYL) 1 MG tablet, Take 2 tablets by mouth daily., Disp: 180 tablet, Rfl: 1 hydrochlorothiazide (HYDRODIURIL) 25 MG tablet, Take 1 tablet (25 mg total) by mouth daily., Disp: 90 tablet, Rfl: 0;  NIFEdipine (NIFEDICAL XL) 30 MG 24 hr tablet, Take 1 tablet (30 mg total) by mouth daily., Disp: 90 tablet, Rfl: 1;  nortriptyline (PAMELOR) 10 MG capsule, Take 1 capsule (10 mg total) by mouth at bedtime., Disp: 90 capsule, Rfl: 0 ramipril (ALTACE) 5 MG capsule, Take 1 capsule (5 mg total) by mouth daily., Disp: 90 capsule, Rfl: 1 No current facility-administered medications for this visit. Facility-Administered Medications Ordered in Other Visits: iohexol (OMNIPAQUE) 350 MG/ML injection 80 mL, 80 mL, Intravenous, Once PRN, Medication Radiologist, MD, 80 mL at 05/16/12 1113  BP 177/73  Pulse 66  Resp 18  Ht 5\' 11"  (1.803 m)  Wt 205 lb 12.8 oz (93.35 kg)  BMI 28.70 kg/m2  Body mass index is 28.70 kg/(m^2).       The exam well-developed well-nourished white male appearing stated age in no acute distress he has 2+ femoral pulses with well-healed incisions 2+ popliteal and 2+ dorsalis pedis pulses bilaterally. He is grossly intact neurologically. Abdominal exam reveals no tenderness and no palpable aneurysm.  CT scan today was discussed with the patient. This shows excellent positioning  of the stent graft with no evidence of endoleak. His aneurysm sac size has shrunk to 4.6 cm.  Impression and plan: Stable followup after stent graft repair of abdominal aortic aneurysm. I discussed this with the patient we will drop back to every 2 years CT scan surveillance followup

## 2012-05-18 ENCOUNTER — Other Ambulatory Visit: Payer: Self-pay | Admitting: *Deleted

## 2012-05-18 MED ORDER — RAMIPRIL 5 MG PO CAPS: 5.0000 mg | ORAL_CAPSULE | Freq: Every day | ORAL | Status: DC

## 2012-06-06 ENCOUNTER — Ambulatory Visit (INDEPENDENT_AMBULATORY_CARE_PROVIDER_SITE_OTHER): Payer: Medicare Other | Admitting: Internal Medicine

## 2012-06-06 ENCOUNTER — Other Ambulatory Visit (INDEPENDENT_AMBULATORY_CARE_PROVIDER_SITE_OTHER): Payer: Medicare Other

## 2012-06-06 ENCOUNTER — Encounter: Payer: Self-pay | Admitting: Internal Medicine

## 2012-06-06 VITALS — BP 160/82 | HR 70 | Temp 98.1°F | Resp 16 | Wt 203.0 lb

## 2012-06-06 DIAGNOSIS — F17200 Nicotine dependence, unspecified, uncomplicated: Secondary | ICD-10-CM | POA: Diagnosis not present

## 2012-06-06 DIAGNOSIS — E785 Hyperlipidemia, unspecified: Secondary | ICD-10-CM

## 2012-06-06 DIAGNOSIS — I714 Abdominal aortic aneurysm, without rupture, unspecified: Secondary | ICD-10-CM

## 2012-06-06 DIAGNOSIS — E119 Type 2 diabetes mellitus without complications: Secondary | ICD-10-CM

## 2012-06-06 DIAGNOSIS — I10 Essential (primary) hypertension: Secondary | ICD-10-CM

## 2012-06-06 DIAGNOSIS — Z23 Encounter for immunization: Secondary | ICD-10-CM | POA: Diagnosis not present

## 2012-06-06 DIAGNOSIS — Z0000 Encounter for general adult medical examination without abnormal findings: Secondary | ICD-10-CM | POA: Diagnosis not present

## 2012-06-06 LAB — COMPREHENSIVE METABOLIC PANEL
ALT: 16 U/L (ref 0–53)
CO2: 30 mEq/L (ref 19–32)
Calcium: 9.3 mg/dL (ref 8.4–10.5)
Chloride: 102 mEq/L (ref 96–112)
Creatinine, Ser: 1.1 mg/dL (ref 0.4–1.5)
GFR: 69.02 mL/min (ref >60.00)
Glucose, Bld: 133 mg/dL — ABNORMAL HIGH (ref 70–99)

## 2012-06-06 LAB — HEPATIC FUNCTION PANEL
Albumin: 3.8 g/dL (ref 3.5–5.2)
Bilirubin, Direct: 0.1 mg/dL (ref 0.0–0.3)
Total Protein: 7 g/dL (ref 6.0–8.3)

## 2012-06-06 LAB — LIPID PANEL
Cholesterol: 115 mg/dL (ref 0–200)
HDL: 41.8 mg/dL (ref >39.00)
Total CHOL/HDL Ratio: 3
Triglycerides: 164 mg/dL — ABNORMAL HIGH (ref 0.0–149.0)

## 2012-06-06 LAB — TSH: TSH: 1.59 u[IU]/mL (ref 0.35–5.50)

## 2012-06-06 MED ORDER — MECLIZINE HCL 12.5 MG PO TABS: 12.5000 mg | ORAL_TABLET | Freq: Three times a day (TID) | ORAL | Status: AC | PRN

## 2012-06-06 MED ORDER — RAMIPRIL 10 MG PO CAPS: 10.0000 mg | ORAL_CAPSULE | Freq: Every day | ORAL | Status: DC

## 2012-06-06 NOTE — Progress Notes (Signed)
Subjective:    Patient ID: Barry [name], male    DOB: 1933-08-13, 78 y.o.   MRN: [id number]  HPI The patient is here for annual Medicare wellness examination and management of other chronic and acute problems. In the interval he has been doing well: no major medical problems, he has intentionally lost 17-20 lbs. He has recently seen Dr. Arbie Cookey for follow-up of endograft AAA - stable with CT Angio in April that did reveal reduction in size of aneurysmal sac. He does have ostial stenosis of a renal artery that could be hemodynamically significant although his BP has been pretty well controlled.    The risk factors are reflected in the social history.  The roster of all physicians providing medical care to patient - is listed in the Snapshot section of the chart.  Activities of daily living:  The patient is 100% inedpendent in all ADLs: dressing, toileting, feeding as well as independent mobility  Home safety : The patient has smoke detectors in the home. They wear seatbelts.  firearms are present in the home, kept in a safe fashion. There is no violence in the home.   There is no risks for hepatitis, STDs or HIV. There is no   history of blood transfusion. They have no travel history to infectious disease endemic areas of the world.  The patient has not seen their dentist in the last six month. They have seen their eye doctor in the last year. They deny any hearing difficulty and have not had audiologic testing in the last year.   They do not  have excessive sun exposure. Discussed the need for sun protection: hats, long sleeves and use of sunscreen if there is significant sun exposure.   Diet: the importance of a healthy diet is discussed. They do have a healthy diet.  The patient has no regular exercise program.  The benefits of regular aerobic exercise were discussed.  Depression screen: there are no signs or vegative symptoms of depression- irritability, change in appetite, anhedonia,  sadness/tearfullness.  Cognitive assessment: the patient manages all their financial and personal affairs and is actively engaged.   The following portions of the patient's history were reviewed and updated as appropriate: allergies, current medications, past family history, past medical history,  past surgical history, past social history  and problem list.  Vision, hearing, body mass index were assessed and reviewed.   During the course of the visit the patient was educated and counseled about appropriate screening and preventive services including : fall prevention , diabetes screening, nutrition counseling, colorectal cancer screening, and recommended immunizations.  Past Medical History  Diagnosis Date  . GERD (gastroesophageal reflux disease)   . Depression   . Tobacco abuse   . Hyperlipidemia   . Diabetes mellitus   . Allergy     rhinitis  . Hepatitis   . Hypertension   . PVD (peripheral vascular disease)   . AAA (abdominal aortic aneurysm) 3-09    4.7 cm  . Diverticulosis   . Personal history of colonic polyps     adenomas, serrated also   Past Surgical History  Procedure Date  . Surgical excision basal cell carcinoma   . Lower limb amputation, other toe 5th   . Tympanic eardrum repair   . Percutaneous stent graft repair of infrarenal aaa 11/10    5.6 cm (T. early)  . Colonoscopy 2006, 2008, 2010, 05/27/2011    numerous adenomas - 13 in 2006, 3, 2008, 2 2012 (up to  1 cm), 4 diminutive adenomas, serrated adenomas 2012. Diverticulosis and hemorrhoids also.   Family History  Problem Relation Age of Onset  . Emphysema Father   . Cancer Brother     Lung  . Kidney disease Sister   . Depression Sister   . Colon cancer Neg Hx    History   Social History  . Marital Status: Widowed    Spouse Name: N/A    Number of Children: N/A  . Years of Education: N/A   Occupational History  . Not on file.   Social History Main Topics  . Smoking status: Current Everyday  Smoker -- 1.0 packs/day for 60 years    Types: Cigarettes  . Smokeless tobacco: Never Used  . Alcohol Use: No  . Drug Use: No  . Sexually Active: Yes -- Male partner(s)   Other Topics Concern  . 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 Lives alone. 1- step-daughter, 1 son - '68; 3 grandchildren. does odd-jobs, yard work    Current Outpatient Prescriptions on File Prior to Visit  Medication Sig Dispense Refill  . aspirin 81 MG tablet Take 81 mg by mouth daily.        Marland Kitchen atenolol (TENORMIN) 25 MG tablet Take 1 tablet (25 mg total) by mouth daily. Patient last office vistit 07/12/2011. Need to make appt. Soon to see Dr. Debby Bud for further refill.  90 tablet  0  . diazepam (VALIUM) 2 MG tablet Take 1 tablet (2 mg total) by mouth 2 (two) times daily.  60 tablet  2  . ezetimibe-simvastatin (VYTORIN) 10-40 MG per tablet Take 1 tablet by mouth at bedtime. Patient needs to call and schedule appt. With Dr, Debby Bud for further refills. Last OV 06/2011  90 tablet  1  . FLUoxetine (PROZAC) 20 MG capsule TAKE 1 CAPSULE DAILY  90 capsule  4  . glimepiride (AMARYL) 1 MG tablet Take 2 tablets by mouth daily.  180 tablet  1  . hydrochlorothiazide (HYDRODIURIL) 25 MG tablet Take 1 tablet (25 mg total) by mouth daily.  90 tablet  0  . NIFEdipine (NIFEDICAL XL) 30 MG 24 hr tablet Take 1 tablet (30 mg total) by mouth daily.  90 tablet  1  . nortriptyline (PAMELOR) 10 MG capsule Take 1 capsule (10 mg total) by mouth at bedtime.  90 capsule  0  . ramipril (ALTACE) 5 MG capsule Take 1 capsule (5 mg total) by mouth daily.  90 capsule  1      Review of Systems System review is negative for any constitutional, cardiac, pulmonary, GI or neuro symptoms or complaints other than as described in the HPI.     Objective:   Physical Exam Filed Vitals:   06/06/12 0948  BP: 160/82  Pulse: 70  Temp: 98.1 F (36.7 C)  Resp: 16   Wt  Readings from Last 3 Encounters:  06/06/12 203 lb (92.08 kg)  05/16/12 205 lb 12.8 oz (93.35 kg)  07/12/11 206 lb (93.441 kg)   Gen'l: Well nourished well developed white male in no acute distress  HEENT: Head: Normocephalic and atraumatic. Right Ear: External ear normal. EAC/TM nl. Left Ear: External ear normal.  EAC-mild abrasion/TM nl. Nose: Nose normal. Mouth/Throat: Oropharynx is clear and moist. Dentition - native, in good repair. No buccal or palatal lesions. Posterior pharynx clear. Eyes: Conjunctivae and sclera clear. EOM intact. Pupils are equal, round, and reactive  to light. Right eye exhibits no discharge. Left eye exhibits no discharge. Neck: Normal range of motion. Neck supple. No JVD present. No tracheal deviation present. No thyromegaly present.  Cardiovascular: Normal rate, regular rhythm, no gallop, no friction rub, no murmur heard.      Quiet precordium. 2+ radial and DP pulses . No carotid bruits Pulmonary/Chest: Effort normal. No respiratory distress or increased WOB, no wheezes, no rales. No chest wall deformity or CVAT. Abdomen: Soft. Bowel sounds are normal in all quadrants. He exhibits no distension, no tenderness, no rebound or guarding, No heptosplenomegaly  Genitourinary:  deferred Musculoskeletal: Normal range of motion. He exhibits no edema and no tenderness. S/p amputation 5th digit right foot.      Small and large joints without redness, synovial thickening or deformity. Full range of motion preserved about all small, median and large joints.  Lymphadenopathy:    He has no cervical or supraclavicular adenopathy.  Neurological: He is alert and oriented to person, place, and time. CN II-XII intact. DTRs 2+ and symmetrical biceps, radial and patellar tendons. Cerebellar function normal with no tremor, rigidity, normal gait and station. foot - right: normal sensation to light touch, pin-prick, deep vibration. Skin: Skin is warm and dry. No rash noted. No erythema.    Psychiatric: He has a normal mood and affect. His behavior is normal. Thought content normal.   Lab Results  Component Value Date   WBC 10.2 09/17/2009   HGB 14.2 09/17/2009   HCT 41.5 09/17/2009   PLT 181 09/17/2009   GLUCOSE 133* 06/06/2012   CHOL 115 06/06/2012   TRIG 164.0* 06/06/2012   HDL 41.80 06/06/2012   LDLDIRECT 152.6 05/28/2008   LDLCALC 40 06/06/2012        ALT 16 06/06/2012   AST 16 06/06/2012        NA 138 06/06/2012   K 4.2 06/06/2012   CL 102 06/06/2012   CREATININE 1.1 06/06/2012   BUN 22 06/06/2012   CO2 30 06/06/2012   TSH 1.59 06/06/2012   PSA 0.23 05/28/2008   INR 1.04 09/12/2009   HGBA1C 7.6* 06/06/2012           Assessment & Plan:

## 2012-06-07 NOTE — Assessment & Plan Note (Signed)
Recent CT angio - stable endograft and smaller aneurysmal sac. He is current with follow- up with Dr. Arbie Cookey.

## 2012-06-07 NOTE — Assessment & Plan Note (Signed)
Still with occasional smoking.  Plan He is encouraged to stop smoking all together.

## 2012-06-07 NOTE — Assessment & Plan Note (Signed)
Interval medical history is unremarkable - he has been well. Physical exam is OK. Labs are in normal range except for A1c. He is current for colorectal cancer screening. Discussed pros and cons of prostate cancer screening (USPHCTF recommendations reviewed and ACU April '13 recommendations) and he defers repeat evaluation at this time. Immunizations - current.  In summary - a very nice man who is medically stable. He is encouraged to cease all tobacco use and to be more vigilant about his diet in regard to diabetes. He will return in 6 months for routine follow-up.

## 2012-06-07 NOTE — Assessment & Plan Note (Signed)
A1C is above goal of 7% but less then threshold for adjusting medication.  Plan Continue present medications.  Better dietary adherence.  Repeat A1C 6 months

## 2012-06-07 NOTE — Assessment & Plan Note (Signed)
LDL is excellent and better than goal of 80 or less; HDL is on the cusp.  Plan Continue present medications  Care about diet. Remain physically active.

## 2012-06-07 NOTE — Assessment & Plan Note (Signed)
BP Readings from Last 3 Encounters:  06/06/12 160/82  05/16/12 177/73  07/12/11 142/70   Poor control. He feels this is "white coat" hypertension.  Plan He is to monitor his BP at home and report back readings with recommendations on medication adjustment to follow.

## 2012-06-26 ENCOUNTER — Other Ambulatory Visit: Payer: Self-pay | Admitting: General Practice

## 2012-06-26 MED ORDER — NIFEDIPINE ER OSMOTIC RELEASE 30 MG PO TB24: 30.0000 mg | ORAL_TABLET | Freq: Every day | ORAL | Status: DC

## 2012-07-07 ENCOUNTER — Other Ambulatory Visit: Payer: Self-pay | Admitting: *Deleted

## 2012-07-07 MED ORDER — ATENOLOL 25 MG PO TABS: 25.0000 mg | ORAL_TABLET | Freq: Every day | ORAL | Status: DC

## 2012-07-07 NOTE — Telephone Encounter (Signed)
REFILL ON ATENOLOL TO PRIME MAIL

## 2012-07-11 ENCOUNTER — Telehealth (HOSPITAL_COMMUNITY): Payer: Self-pay | Admitting: *Deleted

## 2012-07-12 ENCOUNTER — Telehealth (HOSPITAL_COMMUNITY): Payer: Self-pay | Admitting: *Deleted

## 2012-07-12 ENCOUNTER — Other Ambulatory Visit (HOSPITAL_COMMUNITY): Payer: Self-pay | Admitting: Psychiatry

## 2012-07-12 DIAGNOSIS — F329 Major depressive disorder, single episode, unspecified: Secondary | ICD-10-CM

## 2012-07-12 MED ORDER — NORTRIPTYLINE HCL 10 MG PO CAPS
10.0000 mg | ORAL_CAPSULE | Freq: Every day | ORAL | Status: DC
Start: 2012-07-12 — End: 2012-10-04

## 2012-07-18 ENCOUNTER — Ambulatory Visit (INDEPENDENT_AMBULATORY_CARE_PROVIDER_SITE_OTHER): Payer: Medicare Other | Admitting: Psychiatry

## 2012-07-18 ENCOUNTER — Encounter (HOSPITAL_COMMUNITY): Payer: Self-pay | Admitting: Psychiatry

## 2012-07-18 VITALS — Wt 205.0 lb

## 2012-07-18 DIAGNOSIS — F329 Major depressive disorder, single episode, unspecified: Secondary | ICD-10-CM

## 2012-07-18 MED ORDER — DIAZEPAM 2 MG PO TABS: 2.0000 mg | ORAL_TABLET | Freq: Two times a day (BID) | ORAL | Status: DC

## 2012-07-18 NOTE — Progress Notes (Signed)
Chief complaint Medication management and followup.  History of present illness Patient is a 78 year old Caucasian male who came for his followup appointment.  Patient is compliant with his medication.  He denies any side effects.  He denies any recent panic attack agitation anger or any nervousness.  He likes nortriptyline which is also helping his headache.  He is active in endorse improvement in his energy level.  He is sleeping better.  He usually keep himself busy doing yard work .  He's not drinking or using any illegal substance.  He does not ask for early refills of Valium.  His appetite is unchanged from the past.  He has recently seen his primary care physician and his hemoglobin A1c is 7.6 .  His cholesterol was 164, glucose 133.  His BUN crapping and affection test were normal.  His TSH is 1.75  Current psychiatric medication Valium 2 mg twice a day Nortriptyline 10 mg daily Prozac 20 mg daily which is prescribed by primary care physician  Medical history Patient has history of diabetes mellitus, hypertension, peripheral vascular disease, abdominal aortic aneurysm, diverticulosis, GERD and hyperlipidemia.  He sees Dr. Filbert Schilder regularly and recently had blood work.   Mental status examination Patient is well groomed well dressed.  He is pleasant, calm and cooperative.  He maintained a good eye contact. His speech is soft clear and coherent. His thought process is logical linear and goal-directed. He denies any active or passive suicidal thoughts or homicidal thoughts.  He denies any auditory or visual hallucination.  He described his mood is good and his affect is mood congruent.  There no psychotic symptoms present. He's alert and oriented x3. His insight judgment and impulse control is okay.  Assessment Axis I Depressive disorder NOS Axis II deferred Axis III see medical history Axis IV mild to moderate Axis V 65-70  Plan I review his blood work, last progress note and response to  the medication.  I will continue his Valium 2 mg twice a day and nortriptyline 10 mg at bedtime. He is also taking Prozac from his primary care physician. I have explained risks and benefits of medication in detail and recommended to call if he has any question or concern about the medication or if he feel worsening of the symptoms. I also explained that is a possibility he may see a new psychiatrist on his next appointment since I moving full-time in [place] office.  Followup in 3 months.

## 2012-08-03 DIAGNOSIS — H35379 Puckering of macula, unspecified eye: Secondary | ICD-10-CM | POA: Diagnosis not present

## 2012-08-04 DIAGNOSIS — H35379 Puckering of macula, unspecified eye: Secondary | ICD-10-CM | POA: Diagnosis not present

## 2012-08-07 ENCOUNTER — Other Ambulatory Visit: Payer: Self-pay | Admitting: *Deleted

## 2012-08-07 MED ORDER — FLUOXETINE HCL 20 MG PO CAPS: 20.0000 mg | ORAL_CAPSULE | Freq: Every day | ORAL | Status: DC

## 2012-08-11 ENCOUNTER — Other Ambulatory Visit: Payer: Self-pay | Admitting: *Deleted

## 2012-08-11 MED ORDER — HYDROCHLOROTHIAZIDE 25 MG PO TABS: 25.0000 mg | ORAL_TABLET | Freq: Every day | ORAL | Status: DC

## 2012-09-07 DIAGNOSIS — Z23 Encounter for immunization: Secondary | ICD-10-CM | POA: Diagnosis not present

## 2012-09-19 DIAGNOSIS — H35379 Puckering of macula, unspecified eye: Secondary | ICD-10-CM | POA: Diagnosis not present

## 2012-09-20 ENCOUNTER — Other Ambulatory Visit: Payer: Self-pay | Admitting: *Deleted

## 2012-09-20 MED ORDER — GLIMEPIRIDE 1 MG PO TABS: ORAL_TABLET | ORAL | Status: DC

## 2012-10-03 ENCOUNTER — Other Ambulatory Visit: Payer: Self-pay | Admitting: *Deleted

## 2012-10-03 MED ORDER — EZETIMIBE-SIMVASTATIN 10-40 MG PO TABS: 1.0000 | ORAL_TABLET | Freq: Every day | ORAL | Status: DC

## 2012-10-03 MED ORDER — RAMIPRIL 10 MG PO CAPS: 10.0000 mg | ORAL_CAPSULE | Freq: Every day | ORAL | Status: DC

## 2012-10-04 ENCOUNTER — Ambulatory Visit (INDEPENDENT_AMBULATORY_CARE_PROVIDER_SITE_OTHER): Payer: Medicare Other | Admitting: Psychiatry

## 2012-10-04 ENCOUNTER — Encounter (HOSPITAL_COMMUNITY): Payer: Self-pay | Admitting: Psychiatry

## 2012-10-04 VITALS — BP 154/78 | HR 64 | Ht 68.0 in | Wt 203.4 lb

## 2012-10-04 DIAGNOSIS — F341 Dysthymic disorder: Secondary | ICD-10-CM

## 2012-10-04 DIAGNOSIS — F329 Major depressive disorder, single episode, unspecified: Secondary | ICD-10-CM

## 2012-10-04 DIAGNOSIS — F17200 Nicotine dependence, unspecified, uncomplicated: Secondary | ICD-10-CM

## 2012-10-04 MED ORDER — DIAZEPAM 2 MG PO TABS
2.0000 mg | ORAL_TABLET | Freq: Two times a day (BID) | ORAL | Status: DC
Start: 2012-10-04 — End: 2013-01-02

## 2012-10-04 MED ORDER — NORTRIPTYLINE HCL 10 MG PO CAPS: 10.0000 mg | ORAL_CAPSULE | Freq: Every day | ORAL | Status: DC

## 2012-10-04 NOTE — Progress Notes (Signed)
Chief complaint Chief Complaint  Patient presents with  . Depression  . Follow-up  . Medication Refill  . Establish Care   Subjective: I've been on these medications for years.  I'm doing fine".  History of present illness Patient is a 78 year old Caucasian male who came for his followup appointment.  Pt reports that he is compliant with the psychotropic medications with good  benefit and no noticible side effects.  He describes anxiety when in crowds or when he has to stand in crowds.  Suggested that he ask his PCP about Inderal for that.  The Nortriptyline helps his headaches and insomnia.  Current psychiatric medication Valium 2 mg twice a day Nortriptyline 10 mg daily Prozac 20 mg daily which is prescribed by primary care physician  Medical history Patient has history of diabetes mellitus, hypertension, peripheral vascular disease, abdominal aortic aneurysm, diverticulosis, GERD and hyperlipidemia.  He sees Dr. Filbert Schilder regularly and recently had blood work.   Mental status examination Patient is well groomed well dressed.  He is pleasant, calm and cooperative.  He maintained a good eye contact. His speech is soft clear and coherent. His thought process is logical linear and goal-directed. He denies any active or passive suicidal thoughts or homicidal thoughts.  He denies any auditory or visual hallucination.  He described his mood is good and his affect is mood congruent.  There no psychotic symptoms present. He's alert and oriented x3. His insight judgment and impulse control is okay.  Assessment Axis I Depressive disorder NOS Axis II deferred Axis III see medical history Axis IV mild to moderate Axis V 65-70  Plan I took his vitals.  I reviewed CC, tobacco/med/surg Hx, meds effects/ side effects, problem list, therapies and responses as well as current situation/symptoms discussed options. See orders and pt instructions for more details.

## 2012-10-04 NOTE — Patient Instructions (Addendum)
Inderal may be helpful for anxiety.  It is a blood pressure medication.  Please ask your doctor about considering switching your medications to that or include that for blood pressure control.  It will help with the anxiety you described in the office.  .Have a happy holiday.

## 2012-10-17 ENCOUNTER — Ambulatory Visit (HOSPITAL_COMMUNITY): Payer: Self-pay | Admitting: Psychiatry

## 2012-11-03 DIAGNOSIS — H2510 Age-related nuclear cataract, unspecified eye: Secondary | ICD-10-CM | POA: Diagnosis not present

## 2012-11-27 DIAGNOSIS — J392 Other diseases of pharynx: Secondary | ICD-10-CM | POA: Diagnosis not present

## 2012-11-27 DIAGNOSIS — J210 Acute bronchiolitis due to respiratory syncytial virus: Secondary | ICD-10-CM | POA: Diagnosis not present

## 2012-12-04 ENCOUNTER — Other Ambulatory Visit: Payer: Self-pay | Admitting: *Deleted

## 2012-12-04 DIAGNOSIS — H2510 Age-related nuclear cataract, unspecified eye: Secondary | ICD-10-CM | POA: Diagnosis not present

## 2012-12-04 DIAGNOSIS — H269 Unspecified cataract: Secondary | ICD-10-CM | POA: Diagnosis not present

## 2012-12-04 MED ORDER — HYDROCHLOROTHIAZIDE 25 MG PO TABS: 25.0000 mg | ORAL_TABLET | Freq: Every day | ORAL | Status: DC

## 2012-12-11 DIAGNOSIS — H524 Presbyopia: Secondary | ICD-10-CM | POA: Diagnosis not present

## 2013-01-02 ENCOUNTER — Encounter (HOSPITAL_COMMUNITY): Payer: Self-pay | Admitting: Psychiatry

## 2013-01-02 ENCOUNTER — Ambulatory Visit (INDEPENDENT_AMBULATORY_CARE_PROVIDER_SITE_OTHER): Payer: Medicare Other | Admitting: Psychiatry

## 2013-01-02 VITALS — Wt 202.6 lb

## 2013-01-02 DIAGNOSIS — F17200 Nicotine dependence, unspecified, uncomplicated: Secondary | ICD-10-CM

## 2013-01-02 DIAGNOSIS — F341 Dysthymic disorder: Secondary | ICD-10-CM

## 2013-01-02 MED ORDER — NORTRIPTYLINE HCL 10 MG PO CAPS: 10.0000 mg | ORAL_CAPSULE | Freq: Every day | ORAL | Status: DC

## 2013-01-02 MED ORDER — DIAZEPAM 2 MG PO TABS: 2.0000 mg | ORAL_TABLET | Freq: Two times a day (BID) | ORAL | Status: DC

## 2013-01-02 NOTE — Progress Notes (Signed)
[facility] [id number] Progress Note Barry [name] MRN: [id number] DOB: 07/06/1933 Age: 79 y.o.  Date: 01/02/2013 Start Time: 11:10 AM End Time: 11:25 AM  Chief Complaint: Chief Complaint  Patient presents with  . Anxiety  . Depression  . Follow-up  . Medication Refill   Subjective: I've been on these medications for years.  I'm still doing fine". Depression 0/10 and Anxiety 010, where 1 is the best and 10 is the worst.  Pain is 0/10  History of present illness Patient is a 79 year old Caucasian male who came for his followup appointment.  Pt reports that he is compliant with the psychotropic medications with good  benefit and no noticible side effects.  He has been tending to his eye problems.  He has retinal surgery and cataract surgery with good results.  He will be going to his PCP soon and will ask about the Inderal for the anxiety.  Current psychiatric medication Valium 2 mg twice a day Nortriptyline 10 mg daily Prozac 20 mg daily which is prescribed by primary care physician  Medical history Patient has history of diabetes mellitus, hypertension, peripheral vascular disease, abdominal aortic aneurysm, diverticulosis, GERD and hyperlipidemia.  He sees Dr. Filbert Schilder regularly and recently had blood work.   Mental status examination Patient is well groomed well dressed.  He is pleasant, calm and cooperative.  He maintained a good eye contact. His speech is soft clear and coherent. His thought process is logical linear and goal-directed. He denies any active or passive suicidal thoughts or homicidal thoughts.  He denies any auditory or visual hallucination.  He described his mood is good and his affect is mood congruent.  There no psychotic symptoms present. He's alert and oriented x3. His insight judgment and impulse control is okay.  Lab Results:  Results for orders placed in visit on 06/06/12 (from the past 8736 hour(s))  LIPID PANEL   Collection Time    06/06/12  10:45 AM      Result Value Range   Cholesterol 115  0 - 200 mg/dL   Triglycerides 536.6 (*) 0.0 - 149.0 mg/dL   HDL 44.03  >47.42 mg/dL   VLDL 59.5  0.0 - 63.8 mg/dL   LDL Cholesterol 40  0 - 99 mg/dL   Total CHOL/HDL Ratio 3    COMPREHENSIVE METABOLIC PANEL   Collection Time    06/06/12 10:45 AM      Result Value Range   Sodium 138  135 - 145 mEq/L   Potassium 4.2  3.5 - 5.1 mEq/L   Chloride 102  96 - 112 mEq/L   CO2 30  19 - 32 mEq/L   Glucose, Bld 133 (*) 70 - 99 mg/dL   BUN 22  6 - 23 mg/dL   Creatinine, Ser 1.1  0.4 - 1.5 mg/dL   Total Bilirubin 0.5  0.3 - 1.2 mg/dL   Alkaline Phosphatase 47  39 - 117 U/L   AST 16  0 - 37 U/L   ALT 16  0 - 53 U/L   Total Protein 7.0  6.0 - 8.3 g/dL   Albumin 3.8  3.5 - 5.2 g/dL   Calcium 9.3  8.4 - 75.6 mg/dL   GFR 43.32  >95.18 mL/min  TSH   Collection Time    06/06/12 10:45 AM      Result Value Range   TSH 1.59  0.35 - 5.50 uIU/mL  HEPATIC FUNCTION PANEL   Collection Time    06/06/12 10:45 AM  Result Value Range   Total Bilirubin 0.5  0.3 - 1.2 mg/dL   Bilirubin, Direct 0.1  0.0 - 0.3 mg/dL   Alkaline Phosphatase 47  39 - 117 U/L   AST 16  0 - 37 U/L   ALT 16  0 - 53 U/L   Total Protein 7.0  6.0 - 8.3 g/dL   Albumin 3.8  3.5 - 5.2 g/dL  HEMOGLOBIN W0J   Collection Time    06/06/12 10:45 AM      Result Value Range   Hemoglobin A1C 7.6 (*) 4.6 - 6.5 %  PCP draws routine labs and nothing is emerging as of concern.  Assessment Axis I Depressive disorder NOS Axis II deferred Axis III see medical history Axis IV mild to moderate Axis V 65-70  Plan/Discussion: I took his vitals.  I reviewed CC, tobacco/med/surg Hx, meds effects/ side effects, problem list, therapies and responses as well as current situation/symptoms discussed options. Continue current effective medications. See orders and pt instructions for more details.  Medical Decision Making Problem Points:  Established problem, stable/improving (1), Review of  last therapy session (1) and Review of psycho-social stressors (1) Data Points:  Review or order clinical lab tests (1) Review of medication regiment & side effects (2)  I certify that outpatient services furnished can reasonably be expected to improve the patient's condition.   Orson Aloe, MD, [facility]

## 2013-01-02 NOTE — Patient Instructions (Signed)
Keep up the work outside.  Set a timer for 8 minutes and walk for that amount of time in the house or in the yard.  Mark "8" on a calendar for that day.  Do that every day this week.  Then next week increase the time to 9 minutes and then mark the calendar with a 9 for that day.  Each week increase your exercise by one minute.  Keep a record of this so you can see what progress you are making.  Do this every day, just like eating and sleeping.  It is good for pain control, depression, and for your soul/spirit.  Bring the record in for your next visit so we can talk about your effort and how you feel with the new exercise program going and working for you.  Call if problems or concerns.

## 2013-02-12 ENCOUNTER — Other Ambulatory Visit: Payer: Self-pay

## 2013-02-12 MED ORDER — RAMIPRIL 10 MG PO CAPS: 10.0000 mg | ORAL_CAPSULE | Freq: Every day | ORAL | Status: DC

## 2013-02-12 MED ORDER — NIFEDIPINE ER OSMOTIC RELEASE 30 MG PO TB24: 30.0000 mg | ORAL_TABLET | Freq: Every day | ORAL | Status: DC

## 2013-02-13 ENCOUNTER — Other Ambulatory Visit: Payer: Self-pay

## 2013-02-13 DIAGNOSIS — H35379 Puckering of macula, unspecified eye: Secondary | ICD-10-CM | POA: Diagnosis not present

## 2013-02-13 MED ORDER — NIFEDIPINE ER OSMOTIC RELEASE 30 MG PO TB24: 30.0000 mg | ORAL_TABLET | Freq: Every day | ORAL | Status: DC

## 2013-02-13 NOTE — Telephone Encounter (Signed)
Nifedipine should have been routed to the pharmacy, not printed

## 2013-03-07 ENCOUNTER — Other Ambulatory Visit: Payer: Self-pay

## 2013-03-07 MED ORDER — EZETIMIBE-SIMVASTATIN 10-40 MG PO TABS: 1.0000 | ORAL_TABLET | Freq: Every day | ORAL | Status: DC

## 2013-03-07 MED ORDER — GLIMEPIRIDE 1 MG PO TABS: ORAL_TABLET | ORAL | Status: DC

## 2013-04-04 ENCOUNTER — Ambulatory Visit (INDEPENDENT_AMBULATORY_CARE_PROVIDER_SITE_OTHER): Payer: Medicare Other | Admitting: Psychiatry

## 2013-04-04 ENCOUNTER — Encounter (HOSPITAL_COMMUNITY): Payer: Self-pay | Admitting: Psychiatry

## 2013-04-04 VITALS — BP 140/80 | Ht 68.0 in | Wt 200.4 lb

## 2013-04-04 DIAGNOSIS — F329 Major depressive disorder, single episode, unspecified: Secondary | ICD-10-CM | POA: Diagnosis not present

## 2013-04-04 DIAGNOSIS — F5105 Insomnia due to other mental disorder: Secondary | ICD-10-CM

## 2013-04-04 DIAGNOSIS — F418 Other specified anxiety disorders: Secondary | ICD-10-CM | POA: Insufficient documentation

## 2013-04-04 DIAGNOSIS — F17200 Nicotine dependence, unspecified, uncomplicated: Secondary | ICD-10-CM

## 2013-04-04 DIAGNOSIS — F341 Dysthymic disorder: Secondary | ICD-10-CM

## 2013-04-04 MED ORDER — FLUOXETINE HCL 20 MG PO CAPS: 20.0000 mg | ORAL_CAPSULE | Freq: Every day | ORAL | Status: DC

## 2013-04-04 MED ORDER — DIAZEPAM 2 MG PO TABS: 2.0000 mg | ORAL_TABLET | Freq: Two times a day (BID) | ORAL | Status: DC

## 2013-04-04 MED ORDER — NORTRIPTYLINE HCL 10 MG PO CAPS: 10.0000 mg | ORAL_CAPSULE | Freq: Every day | ORAL | Status: DC

## 2013-04-04 NOTE — Progress Notes (Signed)
[facility] [address] Progress Note NASIR BRIGHT MRN: [id number] DOB: 1933-04-09 Age: 79 y.o.  Date: 04/04/2013 Start Time: 10:15 AM End Time: 10:30 AM  Chief Complaint: Chief Complaint  Patient presents with  . Anxiety  . Depression  . Follow-up  . Medication Refill   Subjective: I'm still doing well". Depression 0/10 and Anxiety 010, where 1 is the best and 10 is the worst.  Pain is 0/10  History of present illness Patient is a 79 year old Caucasian male who came for his followup appointment.  Pt reports that he is compliant with the psychotropic medications with good benefit and no noticible side effects.  He has been tending to his eye problems.  His sister passed last week and he seems pretty well adjusted to that.  Current psychiatric medication Valium 2 mg twice a day Nortriptyline 10 mg daily Prozac 20 mg daily which is prescribed by primary care physician  Vitals: BP 140/80  Ht 5\' 8"  (1.727 m)  Wt 200 lb 6.4 oz (90.901 kg)  BMI 30.48 kg/m2  Allergies: Allergies  Allergen Reactions  . Lisinopril-Hydrochlorothiazide Swelling    ZESTRIL   Medical History: Past Medical History  Diagnosis Date  . GERD (gastroesophageal reflux disease)   . Depression   . Tobacco abuse   . Hyperlipidemia   . Diabetes mellitus   . Allergy     rhinitis  . Hepatitis   . Hypertension   . PVD (peripheral vascular disease)   . AAA (abdominal aortic aneurysm) 3-09    4.7 cm  . Diverticulosis   . Personal history of colonic polyps     adenomas, serrated also  Patient has history of diabetes mellitus, hypertension, peripheral vascular disease, abdominal aortic aneurysm, diverticulosis, GERD and hyperlipidemia.  He sees Dr. Filbert Schilder regularly and will be going back soon for blood work.   Surgical History: Past Surgical History  Procedure Laterality Date  . Surgical excision basal cell carcinoma    . Lower limb amputation, other toe 5th    . Tympanic eardrum repair     . Percutaneous stent graft repair of infrarenal aaa  11/10    5.6 cm (T. early)  . Colonoscopy  2006, 2008, 2010, 05/27/2011    numerous adenomas - 13 in 2006, 3, 2008, 2 2012 (up to 1 cm), 4 diminutive adenomas, serrated adenomas 2012. Diverticulosis and hemorrhoids also.  . Eye surgery     Family History: family history includes Cancer in his brother; Depression in his sister; Emphysema in his father; Kidney disease in his sister; and Lung disease in his sister.  There is no history of Colon cancer, and Dementia, and Alcohol abuse, and Drug abuse, and Paranoid behavior, and Schizophrenia, and Anxiety disorder, and Bipolar disorder, and OCD, and Sexual abuse, and Physical abuse, and Seizures, . Reviewed and the history of his sisters was clarified and the passing of [name] recorded.  Mental status examination Patient is well groomed well dressed.  He is pleasant, calm and cooperative.  He maintained a good eye contact. His speech is soft clear and coherent. His thought process is logical linear and goal-directed. He denies any active or passive suicidal thoughts or homicidal thoughts.  He denies any auditory or visual hallucination.  He described his mood is good and his affect is mood congruent.  There no psychotic symptoms present. He's alert and oriented x3. His insight judgment and impulse control is okay.  Lab Results:  Results for orders placed in visit on 06/06/12 (from the  past 8736 hour(s))  LIPID PANEL   Collection Time    06/06/12 10:45 AM      Result Value Range   Cholesterol 115  0 - 200 mg/dL   Triglycerides 161.0 (*) 0.0 - 149.0 mg/dL   HDL 96.04  >54.09 mg/dL   VLDL 81.1  0.0 - 91.4 mg/dL   LDL Cholesterol 40  0 - 99 mg/dL   Total CHOL/HDL Ratio 3    COMPREHENSIVE METABOLIC PANEL   Collection Time    06/06/12 10:45 AM      Result Value Range   Sodium 138  135 - 145 mEq/L   Potassium 4.2  3.5 - 5.1 mEq/L   Chloride 102  96 - 112 mEq/L   CO2 30  19 - 32 mEq/L   Glucose,  Bld 133 (*) 70 - 99 mg/dL   BUN 22  6 - 23 mg/dL   Creatinine, Ser 1.1  0.4 - 1.5 mg/dL   Total Bilirubin 0.5  0.3 - 1.2 mg/dL   Alkaline Phosphatase 47  39 - 117 U/L   AST 16  0 - 37 U/L   ALT 16  0 - 53 U/L   Total Protein 7.0  6.0 - 8.3 g/dL   Albumin 3.8  3.5 - 5.2 g/dL   Calcium 9.3  8.4 - 78.2 mg/dL   GFR 95.62  >13.08 mL/min  TSH   Collection Time    06/06/12 10:45 AM      Result Value Range   TSH 1.59  0.35 - 5.50 uIU/mL  HEPATIC FUNCTION PANEL   Collection Time    06/06/12 10:45 AM      Result Value Range   Total Bilirubin 0.5  0.3 - 1.2 mg/dL   Bilirubin, Direct 0.1  0.0 - 0.3 mg/dL   Alkaline Phosphatase 47  39 - 117 U/L   AST 16  0 - 37 U/L   ALT 16  0 - 53 U/L   Total Protein 7.0  6.0 - 8.3 g/dL   Albumin 3.8  3.5 - 5.2 g/dL  HEMOGLOBIN M5H   Collection Time    06/06/12 10:45 AM      Result Value Range   Hemoglobin A1C 7.6 (*) 4.6 - 6.5 %  PCP draws routine labs and nothing is emerging as of concern.  Assessment Axis I Depressive disorder NOS Axis II deferred Axis III see medical history Axis IV mild to moderate Axis V 65-70  Plan/Discussion: I took his vitals.  I reviewed CC, tobacco/med/surg Hx, meds effects/ side effects, problem list, therapies and responses as well as current situation/symptoms discussed options. Continue current effective medications. See orders and pt instructions for more details.  MEDICATIONS this encounter: Meds ordered this encounter  Medications  . nortriptyline (PAMELOR) 10 MG capsule    Sig: Take 1 capsule (10 mg total) by mouth at bedtime.    Dispense:  90 capsule    Refill:  1  . FLUoxetine (PROZAC) 20 MG capsule    Sig: Take 1 capsule (20 mg total) by mouth daily.    Dispense:  90 capsule    Refill:  1  . diazepam (VALIUM) 2 MG tablet    Sig: Take 1 tablet (2 mg total) by mouth 2 (two) times daily.    Dispense:  60 tablet    Refill:  3    Medical Decision Making Problem Points:  Established problem,  stable/improving (1), Review of last therapy session (1) and Review of psycho-social stressors (1)  Data Points:  Review or order clinical lab tests (1) Review of medication regiment & side effects (2)  I certify that outpatient services furnished can reasonably be expected to improve the patient's condition.   Orson Aloe, MD, [facility]

## 2013-04-04 NOTE — Patient Instructions (Signed)
Keep it up.  Call if problems or concerns.  

## 2013-04-26 DIAGNOSIS — C44319 Basal cell carcinoma of skin of other parts of face: Secondary | ICD-10-CM | POA: Diagnosis not present

## 2013-04-26 DIAGNOSIS — D235 Other benign neoplasm of skin of trunk: Secondary | ICD-10-CM | POA: Diagnosis not present

## 2013-04-26 DIAGNOSIS — C44621 Squamous cell carcinoma of skin of unspecified upper limb, including shoulder: Secondary | ICD-10-CM | POA: Diagnosis not present

## 2013-04-26 DIAGNOSIS — L570 Actinic keratosis: Secondary | ICD-10-CM | POA: Diagnosis not present

## 2013-05-08 ENCOUNTER — Other Ambulatory Visit: Payer: Self-pay

## 2013-05-08 MED ORDER — RAMIPRIL 10 MG PO CAPS: 10.0000 mg | ORAL_CAPSULE | Freq: Every day | ORAL | Status: DC

## 2013-05-22 ENCOUNTER — Other Ambulatory Visit: Payer: Self-pay

## 2013-05-22 MED ORDER — NIFEDIPINE ER OSMOTIC RELEASE 30 MG PO TB24: 30.0000 mg | ORAL_TABLET | Freq: Every day | ORAL | Status: DC

## 2013-05-22 MED ORDER — HYDROCHLOROTHIAZIDE 25 MG PO TABS: 25.0000 mg | ORAL_TABLET | Freq: Every day | ORAL | Status: DC

## 2013-05-23 ENCOUNTER — Telehealth (HOSPITAL_COMMUNITY): Payer: Self-pay | Admitting: Psychiatry

## 2013-05-24 ENCOUNTER — Other Ambulatory Visit (HOSPITAL_COMMUNITY): Payer: Self-pay | Admitting: Psychiatry

## 2013-05-24 NOTE — Telephone Encounter (Signed)
He has a new prescription off Valium given in June for 2 additional refill.  [facility].

## 2013-05-25 ENCOUNTER — Telehealth (HOSPITAL_COMMUNITY): Payer: Self-pay | Admitting: Psychiatry

## 2013-05-27 ENCOUNTER — Other Ambulatory Visit (HOSPITAL_COMMUNITY): Payer: Self-pay | Admitting: Psychiatry

## 2013-06-28 DIAGNOSIS — J0100 Acute maxillary sinusitis, unspecified: Secondary | ICD-10-CM | POA: Diagnosis not present

## 2013-07-02 ENCOUNTER — Other Ambulatory Visit: Payer: Self-pay

## 2013-07-02 MED ORDER — GLIMEPIRIDE 1 MG PO TABS: ORAL_TABLET | ORAL | Status: DC

## 2013-07-02 MED ORDER — ATENOLOL 25 MG PO TABS: 25.0000 mg | ORAL_TABLET | Freq: Every day | ORAL | Status: DC

## 2013-07-02 MED ORDER — RAMIPRIL 10 MG PO CAPS: 10.0000 mg | ORAL_CAPSULE | Freq: Every day | ORAL | Status: DC

## 2013-07-05 ENCOUNTER — Encounter (HOSPITAL_COMMUNITY): Payer: Self-pay | Admitting: Psychiatry

## 2013-07-05 ENCOUNTER — Ambulatory Visit (HOSPITAL_COMMUNITY): Payer: Self-pay | Admitting: Psychiatry

## 2013-07-05 ENCOUNTER — Ambulatory Visit (INDEPENDENT_AMBULATORY_CARE_PROVIDER_SITE_OTHER): Payer: Medicare Other | Admitting: Psychiatry

## 2013-07-05 VITALS — BP 150/78 | Ht 68.0 in | Wt 202.0 lb

## 2013-07-05 DIAGNOSIS — F341 Dysthymic disorder: Secondary | ICD-10-CM | POA: Diagnosis not present

## 2013-07-05 DIAGNOSIS — F329 Major depressive disorder, single episode, unspecified: Secondary | ICD-10-CM | POA: Diagnosis not present

## 2013-07-05 MED ORDER — DIAZEPAM 2 MG PO TABS: 2.0000 mg | ORAL_TABLET | Freq: Two times a day (BID) | ORAL | Status: DC

## 2013-07-05 NOTE — Progress Notes (Signed)
Patient ID: Barry [name], male   DOB: January 18, 1933, 79 y.o.   MRN: [id number] [facility] [address] Progress Note Barry [name] MRN: [id number] DOB: [birth year] Age: 79 y.o.  Date: 07/05/2013 Start Time: 10:15 AM End Time: 10:30 AM  Chief Complaint: Chief Complaint  Patient presents with  . Anxiety  . Follow-up  . Depression  . Medication Refill   Subjective: I'm still doing well".  This patient is a 79 year old widowed white male who lives alone in [place]. He has 2 children and 3 grandchildren who live in [place]. 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  The patient states that he developed anxiety and depression in his 30s. His depression worsened when his wife died suddenly 16 years ago. However he is doing very well on his current medications. He denies being depressed or anxious and he is sleeping quite well. He spends most of his time with her grandchildren and really enjoys it. His social life is quite active. He has no side effects or complaints regarding his medications  Current psychiatric medication Valium 2 mg twice a day Nortriptyline 10 mg daily Prozac 20 mg daily which is prescribed by primary care physician  Vitals: BP 150/78  Ht 5\' 8"  (1.727 m)  Wt 202 lb (91.627 kg)  BMI 30.72 kg/m2  Allergies: Allergies  Allergen Reactions  . Lisinopril-Hydrochlorothiazide Swelling    ZESTRIL   Medical History: Past Medical History  Diagnosis Date  . GERD (gastroesophageal reflux disease)   . Depression   . Tobacco abuse   . Hyperlipidemia   . Diabetes mellitus   . Allergy     rhinitis  . Hepatitis   . Hypertension   . PVD (peripheral vascular disease)   . AAA (abdominal aortic aneurysm) 3-09    4.7 cm  . Diverticulosis   . Personal history of colonic polyps     adenomas, serrated also  Patient has history of diabetes mellitus, hypertension, peripheral vascular disease, abdominal aortic aneurysm,  diverticulosis, GERD and hyperlipidemia.  He sees Dr. Filbert Schilder regularly and will be going back soon for blood work.   Surgical History: Past Surgical History  Procedure Laterality Date  . Surgical excision basal cell carcinoma    . Lower limb amputation, other toe 5th    . Tympanic eardrum repair    . Percutaneous stent graft repair of infrarenal aaa  11/10    5.6 cm (T. early)  . Colonoscopy  2006, 2008, 2010, 05/27/2011    numerous adenomas - 13 in 2006, 3, 2008, 2 2012 (up to 1 cm), 4 diminutive adenomas, serrated adenomas 2012. Diverticulosis and hemorrhoids also.  . Eye surgery     Family History: family history includes Cancer in his brother; Depression in his sister; Emphysema in his father; Kidney disease in his sister; Lung disease in his sister. There is no history of Colon cancer, Dementia, Alcohol abuse, Drug abuse, Paranoid behavior, Schizophrenia, Anxiety disorder, Bipolar disorder, OCD, Sexual abuse, Physical abuse, or Seizures. Reviewed and the history of his sisters was clarified and the passing of [name] recorded.  Mental status examination Patient is well groomed well dressed.  He is pleasant, calm and cooperative.  He maintained a good eye contact. His speech is soft clear and coherent. His thought process is logical linear and goal-directed. He denies any active or passive suicidal thoughts or homicidal thoughts.  He denies any auditory or visual hallucination.  He described his mood is good and his  affect is mood congruent.  There no psychotic symptoms present. He's alert and oriented x3. His insight judgment and impulse control is okay.  Lab Results:  No results found for this or any previous visit (from the past 8736 hour(s)).PCP draws routine labs and nothing is emerging as of concern.  Assessment Axis I Depressive disorder NOS Axis II deferred Axis III see medical history Axis IV mild to moderate Axis V 65-70  Plan/Discussion: I took his vitals.  I reviewed CC,  tobacco/med/surg Hx, meds effects/ side effects, problem list, therapies and responses as well as current situation/symptoms discussed options. Continue current effective medications. See orders and pt instructions for more details.  MEDICATIONS this encounter: Meds ordered this encounter  Medications  . diazepam (VALIUM) 2 MG tablet    Sig: Take 1 tablet (2 mg total) by mouth 2 (two) times daily.    Dispense:  60 tablet    Refill:  3    Medical Decision Making Problem Points:  Established problem, stable/improving (1), Review of last therapy session (1) and Review of psycho-social stressors (1) Data Points:  Review or order clinical lab tests (1) Review of medication regiment & side effects (2)  I certify that outpatient services furnished can reasonably be expected to improve the patient's condition.   Diannia Ruder, MD

## 2013-07-10 ENCOUNTER — Ambulatory Visit (INDEPENDENT_AMBULATORY_CARE_PROVIDER_SITE_OTHER): Payer: Medicare Other | Admitting: Internal Medicine

## 2013-07-10 ENCOUNTER — Other Ambulatory Visit (INDEPENDENT_AMBULATORY_CARE_PROVIDER_SITE_OTHER): Payer: Medicare Other

## 2013-07-10 ENCOUNTER — Encounter: Payer: Self-pay | Admitting: Internal Medicine

## 2013-07-10 VITALS — BP 128/80 | HR 68 | Temp 98.2°F | Wt 203.0 lb

## 2013-07-10 DIAGNOSIS — I10 Essential (primary) hypertension: Secondary | ICD-10-CM | POA: Diagnosis not present

## 2013-07-10 DIAGNOSIS — E785 Hyperlipidemia, unspecified: Secondary | ICD-10-CM | POA: Diagnosis not present

## 2013-07-10 DIAGNOSIS — J309 Allergic rhinitis, unspecified: Secondary | ICD-10-CM | POA: Diagnosis not present

## 2013-07-10 DIAGNOSIS — E119 Type 2 diabetes mellitus without complications: Secondary | ICD-10-CM

## 2013-07-10 LAB — COMPREHENSIVE METABOLIC PANEL
CO2: 30 mEq/L (ref 19–32)
Calcium: 9.8 mg/dL (ref 8.4–10.5)
Chloride: 100 mEq/L (ref 96–112)
Creatinine, Ser: 1 mg/dL (ref 0.4–1.5)
GFR: 76.82 mL/min (ref >60.00)
Glucose, Bld: 184 mg/dL — ABNORMAL HIGH (ref 70–99)
Total Bilirubin: 0.4 mg/dL (ref 0.3–1.2)

## 2013-07-10 LAB — HEMOGLOBIN A1C: Hgb A1c MFr Bld: 8 % — ABNORMAL HIGH (ref 4.6–6.5)

## 2013-07-10 MED ORDER — FLUTICASONE PROPIONATE 50 MCG/ACT NA SUSP: 2.0000 | Freq: Every day | NASAL | Status: DC

## 2013-07-10 NOTE — Patient Instructions (Addendum)
Sinus - no evidence of infection on today's exam. Most likely you are having some allergy problems. Plan nasonex 2 sprays to each nostril twice a day (7 day sample)   Flonase 1 spray to each nostril daily for 2 weeks then reduce to M-W-F  To deal with allergy  For sinus drainage despite flonase take the claritin for that.  Dizziness - the best treatment is meclizine: you can get over the counter Bonine - take 1/2 tablet every 6 hours as needed  Cholesterol - it is ok to take half doses until you are out of the do-nut hole. Once you are back on full dose we can recheck you cholesterol.  Diabetes  - will check A1C today (last checked August '13)  For diabetic orthotics have the pharmacy send me the forms   Blood pressure -  BP Readings from Last 3 Encounters:  07/10/13 128/80  07/05/13 150/78  04/04/13 140/80   Good control as of today. Continue your present medication.

## 2013-07-10 NOTE — Progress Notes (Signed)
Subjective:    Patient ID: Barry [name], male    DOB: [birth year], 79 y.o.   MRN: [id number]  HPI Barry [name] developed sinus pressure, sneezing, had dizziness and felt light headed. He went to an Urgent Care in [place]. He reports that he had no fever, no sinus drainage - purulence, no ear pain. He was treated with Augmentin ER 2 tabs bid x 10 days. He did not notice an appreciable difference. He was not treated for allergy.  He reports that his light-headedness and dizziness is better. He still has a little sinus pressure.  He has had to cut down on vytorin due to cost - DoughNut hole.  Past Medical History  Diagnosis Date  . GERD (gastroesophageal reflux disease)   . Depression   . Tobacco abuse   . Hyperlipidemia   . Diabetes mellitus   . Allergy     rhinitis  . Hepatitis   . Hypertension   . PVD (peripheral vascular disease)   . AAA (abdominal aortic aneurysm) 3-09    4.7 cm  . Diverticulosis   . Personal history of colonic polyps     adenomas, serrated also   Past Surgical History  Procedure Laterality Date  . Surgical excision basal cell carcinoma    . Lower limb amputation, other toe 5th    . Tympanic eardrum repair    . Percutaneous stent graft repair of infrarenal aaa  11/10    5.6 cm (T. early)  . Colonoscopy  2006, 2008, 2010, 05/27/2011    numerous adenomas - 13 in 2006, 3, 2008, 2 2012 (up to 1 cm), 4 diminutive adenomas, serrated adenomas 2012. Diverticulosis and hemorrhoids also.  . Eye surgery     Family History  Problem Relation Age of Onset  . Emphysema Father   . Cancer Brother     Lung  . Depression Sister   . Lung disease Sister     Fibrosis and died from pneumonia on 04-04-2013  . Colon cancer Neg Hx   . Dementia Neg Hx   . Alcohol abuse Neg Hx   . Drug abuse Neg Hx   . Paranoid behavior Neg Hx   . Schizophrenia Neg Hx   . Anxiety disorder Neg Hx   . Bipolar disorder Neg Hx   . OCD Neg Hx   . Sexual abuse Neg Hx   . Physical abuse  Neg Hx   . Seizures Neg Hx   . Kidney disease Sister    History   Social History  . Marital Status: Widowed    Spouse Name: N/A    Number of Children: N/A  . Years of Education: N/A   Occupational History  . Not on file.   Social History Main Topics  . Smoking status: Current Every Day Smoker -- 1.00 packs/day for 60 years    Types: Cigarettes  . Smokeless tobacco: Never Used     Comment: smokes about 16-20 cigs a day as of 04/04/2013  . Alcohol Use: No  . Drug Use: No  . Sexual Activity: Yes    Partners: Female   Other Topics Concern  . 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 Lives alone. 1- step-daughter, 1 son - '68; 3 grandchildren. does odd-jobs, yard work    Current Outpatient Prescriptions on File Prior to Visit  Medication Sig Dispense Refill  . aspirin 81 MG tablet Take 81 mg by  mouth daily.        Marland Kitchen atenolol (TENORMIN) 25 MG tablet Take 1 tablet (25 mg total) by mouth daily. .  90 tablet  3  . diazepam (VALIUM) 2 MG tablet Take 1 tablet (2 mg total) by mouth 2 (two) times daily.  60 tablet  3  . ezetimibe-simvastatin (VYTORIN) 10-40 MG per tablet Take 1 tablet by mouth at bedtime.  90 tablet  1  . FLUoxetine (PROZAC) 20 MG capsule Take 1 capsule (20 mg total) by mouth daily.  90 capsule  1  . glimepiride (AMARYL) 1 MG tablet Take 2 tablets by mouth daily.  180 tablet  3  . hydrochlorothiazide (HYDRODIURIL) 25 MG tablet Take 1 tablet (25 mg total) by mouth daily.  90 tablet  0  . NIFEdipine (NIFEDICAL XL) 30 MG 24 hr tablet Take 1 tablet (30 mg total) by mouth daily.  90 tablet  0  . nortriptyline (PAMELOR) 10 MG capsule Take 1 capsule (10 mg total) by mouth at bedtime.  90 capsule  1  . ramipril (ALTACE) 10 MG capsule Take 1 capsule (10 mg total) by mouth daily.  90 capsule  0   No current facility-administered medications on file prior to visit.      Review of Systems System  review is negative for any constitutional, cardiac, pulmonary, GI or neuro symptoms or complaints other than as described in the HPI.     Objective:   Physical Exam Filed Vitals:   07/10/13 1127  BP: 128/80  Pulse: 68  Temp: 98.2 F (36.8 C)   Wt Readings from Last 3 Encounters:  07/10/13 203 lb (92.08 kg)  07/05/13 202 lb (91.627 kg)  04/04/13 200 lb 6.4 oz (90.901 kg)   Gen'l- WNWD white man in no distress HEENT - EAC/TMs normal, no tenderness to percussion frontal or maxillary sinus, throat clear Cor- 2+ radial RRR Pulm - normal respirations, CTAP Neuro - A&O x 3, normal gait and station       Assessment & Plan:

## 2013-07-10 NOTE — Assessment & Plan Note (Signed)
No evidence of sinus infection on today's exam. Question of whether he had an infection originally based on his report: no fever, no purulent drainage, no lymphadenpathy.  Plan  resume nasal steroid spray

## 2013-07-10 NOTE — Assessment & Plan Note (Signed)
BP Readings from Last 3 Encounters:  07/10/13 128/80  07/05/13 150/78  04/04/13 140/80   OK control on present medications

## 2013-07-10 NOTE — Assessment & Plan Note (Signed)
Lab Results  Component Value Date   HGBA1C 7.6* 06/06/2012   Plan For A1C today with recommendations to follow

## 2013-07-10 NOTE — Assessment & Plan Note (Signed)
On half dose vytorin due to $$$. Last LDL (on full dose) 47 approx 1 year ago  Plan Continue vytorin   Repeat lipid panel when back on full dose.

## 2013-07-13 ENCOUNTER — Encounter: Payer: Self-pay | Admitting: Internal Medicine

## 2013-07-13 MED ORDER — METFORMIN HCL 500 MG PO TABS: 500.0000 mg | ORAL_TABLET | Freq: Two times a day (BID) | ORAL | Status: DC

## 2013-07-23 ENCOUNTER — Telehealth: Payer: Self-pay | Admitting: *Deleted

## 2013-07-23 NOTE — Telephone Encounter (Signed)
Left message for patient to return call.

## 2013-07-23 NOTE — Telephone Encounter (Signed)
Should check blood sugar no more than 2 times a week or check prn if he feels bad.  OK to refill Rx for test strips - need to know type of glucometer he uses.

## 2013-07-23 NOTE — Telephone Encounter (Signed)
Pt called requesting advise on how many times per day should he test his blood sugar.  Pt also states he needs more test strips ordered, I do not see an Rx for test strips on medication list.  Please advise

## 2013-07-24 MED ORDER — BLOOD GLUCOSE TEST VI STRP: ORAL_STRIP | Status: DC

## 2013-07-24 NOTE — Telephone Encounter (Signed)
Patient called back and stated he uses one touch ultra.Requests script for strips be sent to [facility] phone # [phone] fax is [phone] in [place]

## 2013-08-01 ENCOUNTER — Ambulatory Visit (INDEPENDENT_AMBULATORY_CARE_PROVIDER_SITE_OTHER): Payer: Medicare Other | Admitting: Internal Medicine

## 2013-08-01 ENCOUNTER — Encounter: Payer: Self-pay | Admitting: Internal Medicine

## 2013-08-01 VITALS — BP 158/90 | HR 64 | Temp 97.6°F | Wt 199.4 lb

## 2013-08-01 DIAGNOSIS — E119 Type 2 diabetes mellitus without complications: Secondary | ICD-10-CM

## 2013-08-01 DIAGNOSIS — J309 Allergic rhinitis, unspecified: Secondary | ICD-10-CM | POA: Diagnosis not present

## 2013-08-01 DIAGNOSIS — Z23 Encounter for immunization: Secondary | ICD-10-CM

## 2013-08-01 MED ORDER — PIOGLITAZONE HCL 30 MG PO TABS: 30.0000 mg | ORAL_TABLET | Freq: Every day | ORAL | Status: DC

## 2013-08-01 NOTE — Patient Instructions (Signed)
Sorry you are still having headaches  1. Diabetes - metformin can cause headaches. Plan Stop metformin  Continue Amaryl 1 mg twice a day  Start generic actos 30 mg once a day  If you tolerate the actos there is a combination pill of actos/amaryl  You should check your blood sugar if you feel bad but you don't need to check it otherwise.   2. Sinuses - no evidence of a bacterial infection on exam: no fever, no nasty snot, no sinus tenderness Plan Nasal saline wash  Limited use of Afrin  If you don't get better a CT scan of the sinus may help figure out the underlying problem.

## 2013-08-01 NOTE — Progress Notes (Signed)
Subjective:    Patient ID: Barry [name], male    DOB: 10-05-1933, 79 y.o.   MRN: [id number]  HPI Barry [name] presents for HA: forehead and above the eyes. He also reports that he has an intolerance of metformin - it causes a headache. He is not running fever, he has not had any purulent rhinorrhea. He does have sinus congestion. He is intolerant of flonase - disrupts his sleep.   Past Medical History  Diagnosis Date  . GERD (gastroesophageal reflux disease)   . Depression   . Tobacco abuse   . Hyperlipidemia   . Diabetes mellitus   . Allergy     rhinitis  . Hepatitis   . Hypertension   . PVD (peripheral vascular disease)   . AAA (abdominal aortic aneurysm) 3-09    4.7 cm  . Diverticulosis   . Personal history of colonic polyps     adenomas, serrated also   Past Surgical History  Procedure Laterality Date  . Surgical excision basal cell carcinoma    . Lower limb amputation, other toe 5th    . Tympanic eardrum repair    . Percutaneous stent graft repair of infrarenal aaa  11/10    5.6 cm (T. early)  . Colonoscopy  2006, 2008, 2010, 05/27/2011    numerous adenomas - 13 in 2006, 3, 2008, 2 2012 (up to 1 cm), 4 diminutive adenomas, serrated adenomas 2012. Diverticulosis and hemorrhoids also.  . Eye surgery     Family History  Problem Relation Age of Onset  . Emphysema Father   . Cancer Brother     Lung  . Depression Sister   . Lung disease Sister     Fibrosis and died from pneumonia on 04/20/2013  . Colon cancer Neg Hx   . Dementia Neg Hx   . Alcohol abuse Neg Hx   . Drug abuse Neg Hx   . Paranoid behavior Neg Hx   . Schizophrenia Neg Hx   . Anxiety disorder Neg Hx   . Bipolar disorder Neg Hx   . OCD Neg Hx   . Sexual abuse Neg Hx   . Physical abuse Neg Hx   . Seizures Neg Hx   . Kidney disease Sister    History   Social History  . Marital Status: Widowed    Spouse Name: N/A    Number of Children: N/A  . Years of Education: N/A   Occupational History  .  Not on file.   Social History Main Topics  . Smoking status: Current Every Day Smoker -- 1.00 packs/day for 60 years    Types: Cigarettes  . Smokeless tobacco: Never Used     Comment: smokes about 16-20 cigs a day as of 04/04/2013  . Alcohol Use: No  . Drug Use: No  . Sexual Activity: Yes    Partners: Female   Other Topics Concern  . 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 Lives alone. 1- step-daughter, 1 son - '68; 3 grandchildren. does odd-jobs, yard work    Current Outpatient Prescriptions on File Prior to Visit  Medication Sig Dispense Refill  . aspirin 81 MG tablet Take 81 mg by mouth daily.        Marland Kitchen atenolol (TENORMIN) 25 MG tablet Take 1 tablet (25 mg total) by mouth daily. .  90 tablet  3  . diazepam (VALIUM) 2 MG tablet Take 1 tablet (  2 mg total) by mouth 2 (two) times daily.  60 tablet  3  . ezetimibe-simvastatin (VYTORIN) 10-40 MG per tablet Take 1 tablet by mouth at bedtime.  90 tablet  1  . FLUoxetine (PROZAC) 20 MG capsule Take 1 capsule (20 mg total) by mouth daily.  90 capsule  1  . glimepiride (AMARYL) 1 MG tablet Take 2 tablets by mouth daily.  180 tablet  3  . Glucose Blood (BLOOD GLUCOSE TEST STRIPS) STRP Use to test blood sugar two times a week DX: 250.00  100 each  5  . hydrochlorothiazide (HYDRODIURIL) 25 MG tablet Take 1 tablet (25 mg total) by mouth daily.  90 tablet  0  . NIFEdipine (NIFEDICAL XL) 30 MG 24 hr tablet Take 1 tablet (30 mg total) by mouth daily.  90 tablet  0  . nortriptyline (PAMELOR) 10 MG capsule Take 1 capsule (10 mg total) by mouth at bedtime.  90 capsule  1  . ramipril (ALTACE) 10 MG capsule Take 1 capsule (10 mg total) by mouth daily.  90 capsule  0   No current facility-administered medications on file prior to visit.      Review of Systems System review is negative for any constitutional, cardiac, pulmonary, GI or neuro symptoms or complaints other than  as described in the HPI.     Objective:   Physical Exam Filed Vitals:   08/01/13 0957  BP: 158/90  Pulse: 64  Temp: 97.6 F (36.4 C)   Wt Readings from Last 3 Encounters:  08/01/13 199 lb 6.4 oz (90.447 kg)  07/10/13 203 lb (92.08 kg)  07/05/13 202 lb (91.627 kg)   Gen'l- older man in no acute distress HEENT- no sinus tenderness to percussion, EAC/TMs normal Pulm - normal respirations Cor - RRR       Assessment & Plan:

## 2013-08-01 NOTE — Assessment & Plan Note (Signed)
Lab Results  Component Value Date   HGBA1C 8.0* 07/10/2013   Patient intolerant of metformin  Plan Continue amaryl 1 mg bid  Add actos 30 mg daily  If tolerated will go to combination product

## 2013-08-01 NOTE — Assessment & Plan Note (Signed)
Recurrent problem with sinus pressure. No evidence of bacterial infection  Plan Supportive care: nettie pot, limited use of afrin spray  If no improvement will consider CT sinus.

## 2013-08-03 ENCOUNTER — Other Ambulatory Visit: Payer: Self-pay | Admitting: Internal Medicine

## 2013-08-03 DIAGNOSIS — E119 Type 2 diabetes mellitus without complications: Secondary | ICD-10-CM

## 2013-08-06 ENCOUNTER — Telehealth: Payer: Self-pay

## 2013-08-06 NOTE — Telephone Encounter (Signed)
I spoke to [place] at [facility] she states foot exam needs to be within 6 months so the 05/2012 office note can not be accepted.

## 2013-08-06 NOTE — Telephone Encounter (Signed)
Office note from 05/2012 has been faxed to [phone] [facility] attn Tyler Aas

## 2013-08-06 NOTE — Telephone Encounter (Signed)
Quick ov for foot exam

## 2013-08-06 NOTE — Telephone Encounter (Signed)
Phone call from [place] at [facility] [phone] stating the fax of information on the patient doesn't specifically state anything about a diabetic foot exam.

## 2013-08-06 NOTE — Telephone Encounter (Signed)
Last foot exam in 2013, august. He had normal sensation but he has had toe amputation in setting of diabetes that is documented and has been sufficient in the past.   Happy to have him return if more current foot exam required.

## 2013-08-07 ENCOUNTER — Telehealth: Payer: Self-pay | Admitting: *Deleted

## 2013-08-07 DIAGNOSIS — J329 Chronic sinusitis, unspecified: Secondary | ICD-10-CM

## 2013-08-07 NOTE — Telephone Encounter (Signed)
Pt called requesting Dr Debby Bud to place an order for Sinus CT.  Please advise

## 2013-08-07 NOTE — Telephone Encounter (Signed)
Pt wanted to come next week.  He made an appt for 2:00 Oct 28.

## 2013-08-07 NOTE — Telephone Encounter (Signed)
Per Dr Debby Bud patient to come in for OV. Will you see if 11:15 today is good for him. thanks

## 2013-08-08 NOTE — Telephone Encounter (Signed)
Left message on VM stating Ct has been ordered

## 2013-08-08 NOTE — Telephone Encounter (Signed)
Order entered for CT sinus at [facility]

## 2013-08-09 ENCOUNTER — Ambulatory Visit (INDEPENDENT_AMBULATORY_CARE_PROVIDER_SITE_OTHER)
Admission: RE | Admit: 2013-08-09 | Discharge: 2013-08-09 | Disposition: A | Discharge status: Home / Self Care | Payer: Medicare Other | Source: Ambulatory Visit | Attending: Internal Medicine | Admitting: Internal Medicine

## 2013-08-09 ENCOUNTER — Other Ambulatory Visit: Payer: Self-pay | Admitting: Internal Medicine

## 2013-08-09 DIAGNOSIS — J3489 Other specified disorders of nose and nasal sinuses: Secondary | ICD-10-CM | POA: Diagnosis not present

## 2013-08-09 DIAGNOSIS — J329 Chronic sinusitis, unspecified: Secondary | ICD-10-CM

## 2013-08-14 ENCOUNTER — Other Ambulatory Visit: Payer: Self-pay

## 2013-08-14 ENCOUNTER — Ambulatory Visit: Payer: Self-pay | Admitting: Internal Medicine

## 2013-08-14 MED ORDER — NIFEDIPINE ER OSMOTIC RELEASE 30 MG PO TB24: 30.0000 mg | ORAL_TABLET | Freq: Every day | ORAL | Status: DC

## 2013-09-18 ENCOUNTER — Other Ambulatory Visit: Payer: Self-pay

## 2013-09-18 MED ORDER — HYDROCHLOROTHIAZIDE 25 MG PO TABS: 25.0000 mg | ORAL_TABLET | Freq: Every day | ORAL | Status: DC

## 2013-09-25 ENCOUNTER — Other Ambulatory Visit: Payer: Self-pay | Admitting: Internal Medicine

## 2013-10-04 ENCOUNTER — Ambulatory Visit (INDEPENDENT_AMBULATORY_CARE_PROVIDER_SITE_OTHER): Payer: Medicare Other | Admitting: Psychiatry

## 2013-10-04 ENCOUNTER — Encounter (HOSPITAL_COMMUNITY): Payer: Self-pay | Admitting: Psychiatry

## 2013-10-04 VITALS — BP 149/70 | Ht 68.0 in | Wt 207.0 lb

## 2013-10-04 DIAGNOSIS — F329 Major depressive disorder, single episode, unspecified: Secondary | ICD-10-CM

## 2013-10-04 DIAGNOSIS — F418 Other specified anxiety disorders: Secondary | ICD-10-CM

## 2013-10-04 DIAGNOSIS — F341 Dysthymic disorder: Secondary | ICD-10-CM

## 2013-10-04 MED ORDER — FLUOXETINE HCL 20 MG PO CAPS: 20.0000 mg | ORAL_CAPSULE | Freq: Every day | ORAL | Status: DC

## 2013-10-04 MED ORDER — DIAZEPAM 2 MG PO TABS: 2.0000 mg | ORAL_TABLET | Freq: Two times a day (BID) | ORAL | Status: DC

## 2013-10-04 MED ORDER — NORTRIPTYLINE HCL 10 MG PO CAPS: 10.0000 mg | ORAL_CAPSULE | Freq: Every day | ORAL | Status: DC

## 2013-10-04 NOTE — Progress Notes (Signed)
Patient ID: Barry [name], male   DOB: 1932-11-07, 79 y.o.   MRN: [id number] Patient ID: Barry [name], male   DOB: 12/27/1932, 79 y.o.   MRN: [id number] [facility] [id number] Progress Note Barry [name] MRN: [id number] DOB: Jun 23, 1933 Age: 79 y.o.  Date: 10/04/2013 Start Time: 10:15 AM End Time: 10:30 AM  Chief Complaint: Chief Complaint  Patient presents with  . Anxiety  . Depression  . Follow-up   Subjective: I'm still doing well".  This patient is a 79 year old widowed white male who lives alone in [place]. He has 2 children and 3 grandchildren who live in [place]. 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  The patient states that he developed anxiety and depression in his 30s. His depression worsened when his wife died suddenly 16 years ago. However he is doing very well on his current medications. He denies being depressed or anxious and he is sleeping quite well. He spends most of his time with her grandchildren and really enjoys it. His social life is quite active. He has no side effects or complaints regarding his medications  The patient returns after 3 months. He continues to do well. He is getting out with his friends and his grandchildren. His mood is been stable. His only complaint is that his blood pressure drops in the afternoon on his new diabetic medicine. I urged him to try to his PCP about this  Current psychiatric medication Valium 2 mg twice a day Nortriptyline 10 mg daily Prozac 20 mg daily which is prescribed by primary care physician  Vitals: BP 149/70  Ht 5\' 8"  (1.727 m)  Wt 207 lb (93.895 kg)  BMI 31.48 kg/m2  Allergies: Allergies  Allergen Reactions  . Lisinopril-Hydrochlorothiazide Swelling    ZESTRIL   Medical History: Past Medical History  Diagnosis Date  . GERD (gastroesophageal reflux disease)   . Depression   . Tobacco abuse   . Hyperlipidemia   . Diabetes mellitus   . Allergy      rhinitis  . Hepatitis   . Hypertension   . PVD (peripheral vascular disease)   . AAA (abdominal aortic aneurysm) 3-09    4.7 cm  . Diverticulosis   . Personal history of colonic polyps     adenomas, serrated also  Patient has history of diabetes mellitus, hypertension, peripheral vascular disease, abdominal aortic aneurysm, diverticulosis, GERD and hyperlipidemia.  He sees Dr. Filbert [name] regularly and will be going back soon for blood work.   Surgical History: Past Surgical History  Procedure Laterality Date  . Surgical excision basal cell carcinoma    . Lower limb amputation, other toe 5th    . Tympanic eardrum repair    . Percutaneous stent graft repair of infrarenal aaa  11/10    5.6 cm (T. early)  . Colonoscopy  2006, 2008, 2010, 05/27/2011    numerous adenomas - 13 in 2006, 3, 2008, 2 2012 (up to 1 cm), 4 diminutive adenomas, serrated adenomas 2012. Diverticulosis and hemorrhoids also.  . Eye surgery     Family History: family history includes Cancer in his brother; Depression in his sister; Emphysema in his father; Kidney disease in his sister; Lung disease in his sister. There is no history of Colon cancer, Dementia, Alcohol abuse, Drug abuse, Paranoid behavior, Schizophrenia, Anxiety disorder, Bipolar disorder, OCD, Sexual abuse, Physical abuse, or Seizures. Reviewed and the history of his sisters was clarified and the passing of [name] recorded.  Mental status examination Patient is well groomed well dressed.  He is pleasant, calm and cooperative.  He maintained a good eye contact. His speech is soft clear and coherent. His thought process is logical linear and goal-directed. He denies any active or passive suicidal thoughts or homicidal thoughts.  He denies any auditory or visual hallucination.  He described his mood is good and his affect is mood congruent.  There no psychotic symptoms present. He's alert and oriented x3. His insight judgment and impulse control is okay.  Lab  Results:  Results for orders placed in visit on 07/10/13 (from the past 8736 hour(s))  COMPREHENSIVE METABOLIC PANEL   Collection Time    07/10/13 12:13 PM      Result Value Range   Sodium 137  135 - 145 mEq/L   Potassium 4.2  3.5 - 5.1 mEq/L   Chloride 100  96 - 112 mEq/L   CO2 30  19 - 32 mEq/L   Glucose, Bld 184 (*) 70 - 99 mg/dL   BUN 19  6 - 23 mg/dL   Creatinine, Ser 1.0  0.4 - 1.5 mg/dL   Total Bilirubin 0.4  0.3 - 1.2 mg/dL   Alkaline Phosphatase 47  39 - 117 U/L   AST 20  0 - 37 U/L   ALT 16  0 - 53 U/L   Total Protein 6.9  6.0 - 8.3 g/dL   Albumin 3.7  3.5 - 5.2 g/dL   Calcium 9.8  8.4 - 96.0 mg/dL   GFR 45.40  >98.11 mL/min  HEMOGLOBIN A1C   Collection Time    07/10/13 12:13 PM      Result Value Range   Hemoglobin A1C 8.0 (*) 4.6 - 6.5 %  PCP draws routine labs and nothing is emerging as of concern.  Assessment Axis I Depressive disorder NOS Axis II deferred Axis III see medical history Axis IV mild to moderate Axis V 65-70  Plan/Discussion: I took his vitals.  I reviewed CC, tobacco/med/surg Hx, meds effects/ side effects, problem list, therapies and responses as well as current situation/symptoms discussed options. Continue current effective medications. He'll return in 4 months See orders and pt instructions for more details.  MEDICATIONS this encounter: Meds ordered this encounter  Medications  . diazepam (VALIUM) 2 MG tablet    Sig: Take 1 tablet (2 mg total) by mouth 2 (two) times daily.    Dispense:  60 tablet    Refill:  3  . nortriptyline (PAMELOR) 10 MG capsule    Sig: Take 1 capsule (10 mg total) by mouth at bedtime.    Dispense:  90 capsule    Refill:  1  . FLUoxetine (PROZAC) 20 MG capsule    Sig: Take 1 capsule (20 mg total) by mouth daily.    Dispense:  90 capsule    Refill:  1    Medical Decision Making Problem Points:  Established problem, stable/improving (1), Review of last therapy session (1) and Review of psycho-social  stressors (1) Data Points:  Review or order clinical lab tests (1) Review of medication regiment & side effects (2)  I certify that outpatient services furnished can reasonably be expected to improve the patient's condition.   Diannia Ruder, MD

## 2013-10-22 DIAGNOSIS — L678 Other hair color and hair shaft abnormalities: Secondary | ICD-10-CM | POA: Diagnosis not present

## 2013-10-22 DIAGNOSIS — L219 Seborrheic dermatitis, unspecified: Secondary | ICD-10-CM | POA: Diagnosis not present

## 2013-10-22 DIAGNOSIS — L738 Other specified follicular disorders: Secondary | ICD-10-CM | POA: Diagnosis not present

## 2013-10-22 DIAGNOSIS — C44319 Basal cell carcinoma of skin of other parts of face: Secondary | ICD-10-CM | POA: Diagnosis not present

## 2013-10-22 DIAGNOSIS — L570 Actinic keratosis: Secondary | ICD-10-CM | POA: Diagnosis not present

## 2013-10-25 DIAGNOSIS — C44519 Basal cell carcinoma of skin of other part of trunk: Secondary | ICD-10-CM | POA: Diagnosis not present

## 2013-10-25 DIAGNOSIS — T887XXA Unspecified adverse effect of drug or medicament, initial encounter: Secondary | ICD-10-CM | POA: Diagnosis not present

## 2013-11-08 ENCOUNTER — Other Ambulatory Visit: Payer: Self-pay

## 2013-11-08 MED ORDER — RAMIPRIL 10 MG PO CAPS: 10.0000 mg | ORAL_CAPSULE | Freq: Every day | ORAL | Status: DC

## 2013-11-12 ENCOUNTER — Other Ambulatory Visit: Payer: Self-pay

## 2013-11-12 MED ORDER — EZETIMIBE-SIMVASTATIN 10-40 MG PO TABS: 1.0000 | ORAL_TABLET | Freq: Every day | ORAL | Status: DC

## 2013-11-15 ENCOUNTER — Emergency Department (HOSPITAL_COMMUNITY): Payer: Medicare Other

## 2013-11-15 ENCOUNTER — Encounter (HOSPITAL_COMMUNITY): Payer: Self-pay | Admitting: Emergency Medicine

## 2013-11-15 ENCOUNTER — Emergency Department (HOSPITAL_COMMUNITY)
Admission: EM | Admit: 2013-11-15 | Discharge: 2013-11-15 | Disposition: A | Discharge status: Home / Self Care | Payer: Medicare Other | Attending: Emergency Medicine | Admitting: Emergency Medicine

## 2013-11-15 DIAGNOSIS — I10 Essential (primary) hypertension: Secondary | ICD-10-CM | POA: Diagnosis not present

## 2013-11-15 DIAGNOSIS — Z860100 Personal history of colon polyps, unspecified: Secondary | ICD-10-CM | POA: Insufficient documentation

## 2013-11-15 DIAGNOSIS — R079 Chest pain, unspecified: Secondary | ICD-10-CM | POA: Diagnosis not present

## 2013-11-15 DIAGNOSIS — F329 Major depressive disorder, single episode, unspecified: Secondary | ICD-10-CM | POA: Insufficient documentation

## 2013-11-15 DIAGNOSIS — E119 Type 2 diabetes mellitus without complications: Secondary | ICD-10-CM | POA: Insufficient documentation

## 2013-11-15 DIAGNOSIS — F17200 Nicotine dependence, unspecified, uncomplicated: Secondary | ICD-10-CM | POA: Insufficient documentation

## 2013-11-15 DIAGNOSIS — E785 Hyperlipidemia, unspecified: Secondary | ICD-10-CM | POA: Diagnosis not present

## 2013-11-15 DIAGNOSIS — R0789 Other chest pain: Secondary | ICD-10-CM | POA: Diagnosis not present

## 2013-11-15 DIAGNOSIS — Z8719 Personal history of other diseases of the digestive system: Secondary | ICD-10-CM | POA: Diagnosis not present

## 2013-11-15 DIAGNOSIS — F3289 Other specified depressive episodes: Secondary | ICD-10-CM | POA: Insufficient documentation

## 2013-11-15 DIAGNOSIS — Z79899 Other long term (current) drug therapy: Secondary | ICD-10-CM | POA: Diagnosis not present

## 2013-11-15 DIAGNOSIS — Z7982 Long term (current) use of aspirin: Secondary | ICD-10-CM | POA: Insufficient documentation

## 2013-11-15 LAB — COMPREHENSIVE METABOLIC PANEL
ALBUMIN: 3.5 g/dL (ref 3.5–5.2)
ALT: 16 U/L (ref 0–53)
AST: 18 U/L (ref 0–37)
Alkaline Phosphatase: 46 U/L (ref 39–117)
BILIRUBIN TOTAL: 0.2 mg/dL — AB (ref 0.3–1.2)
BUN: 23 mg/dL (ref 6–23)
CO2: 30 mEq/L (ref 19–32)
CREATININE: 1.21 mg/dL (ref 0.50–1.35)
Calcium: 9.9 mg/dL (ref 8.4–10.5)
Chloride: 95 mEq/L — ABNORMAL LOW (ref 96–112)
GFR calc Af Amer: 64 mL/min — ABNORMAL LOW (ref >90)
GFR calc non Af Amer: 56 mL/min — ABNORMAL LOW (ref >90)
Glucose, Bld: 160 mg/dL — ABNORMAL HIGH (ref 70–99)
Potassium: 4 mEq/L (ref 3.7–5.3)
Sodium: 136 mEq/L — ABNORMAL LOW (ref 137–147)
TOTAL PROTEIN: 6.9 g/dL (ref 6.0–8.3)

## 2013-11-15 LAB — CBC WITH DIFFERENTIAL/PLATELET
BASOS PCT: 1 % (ref 0–1)
Basophils Absolute: 0.1 10*3/uL (ref 0.0–0.1)
EOS ABS: 0.3 10*3/uL (ref 0.0–0.7)
Eosinophils Relative: 5 % (ref 0–5)
HCT: 48.6 % (ref 39.0–52.0)
HEMOGLOBIN: 16.8 g/dL (ref 13.0–17.0)
Lymphocytes Relative: 37 % (ref 12–46)
Lymphs Abs: 2.3 10*3/uL (ref 0.7–4.0)
MCH: 32.7 pg (ref 26.0–34.0)
MCHC: 34.6 g/dL (ref 30.0–36.0)
MCV: 94.7 fL (ref 78.0–100.0)
MONOS PCT: 12 % (ref 3–12)
Monocytes Absolute: 0.8 10*3/uL (ref 0.1–1.0)
Neutro Abs: 2.8 10*3/uL (ref 1.7–7.7)
Neutrophils Relative %: 45 % (ref 43–77)
Platelets: 208 10*3/uL (ref 150–400)
RBC: 5.13 MIL/uL (ref 4.22–5.81)
RDW: 14 % (ref 11.5–15.5)
WBC: 6.3 10*3/uL (ref 4.0–10.5)

## 2013-11-15 LAB — TROPONIN I

## 2013-11-15 MED ORDER — GI COCKTAIL ~~LOC~~
30.0000 mL | Freq: Once | ORAL | Status: AC
Start: 2013-11-15 — End: 2013-11-15
  Administered 2013-11-15: 30 mL via ORAL
  Filled 2013-11-15: qty 30

## 2013-11-15 MED ORDER — SIMETHICONE 40 MG/0.6ML PO SUSP: 40.0000 mg | Freq: Four times a day (QID) | ORAL | Status: DC | PRN

## 2013-11-15 NOTE — Discharge Instructions (Signed)
As discussed, it is important to follow up with her primary care physician to ensure appropriate resolution of your symptoms.  Return here for interim changes in your condition.

## 2013-11-15 NOTE — ED Provider Notes (Signed)
CSN: [id number]     Arrival date & time 11/15/13  1538 History  This chart was scribed for Carmin Muskrat, MD by Rolanda Lundborg, ED Scribe. This patient was seen in room APA02/APA02 and the patient's care was started at 6:43 PM.    Chief Complaint  Patient presents with  . Chest Pain   The history is provided by the patient. No language interpreter was used.   HPI Comments: Barry [name] is a 80 y.o. male with a h/o GERD who presents to the Emergency Department complaining of left-sided CP since 3 days ago. Pt describes the pain as indigestion and reports frequent belching. He states walking makes the CP and belching worse. Pt reports he has been taking Prilosec. He reports his stomach has been unsettled.    Past Medical History  Diagnosis Date  . GERD (gastroesophageal reflux disease)   . Depression   . Tobacco abuse   . Hyperlipidemia   . Diabetes mellitus   . Allergy     rhinitis  . Hepatitis   . Hypertension   . PVD (peripheral vascular disease)   . AAA (abdominal aortic aneurysm) 3-09    4.7 cm  . Diverticulosis   . Personal history of colonic polyps     adenomas, serrated also   Past Surgical History  Procedure Laterality Date  . Surgical excision basal cell carcinoma    . Lower limb amputation, other toe 5th    . Tympanic eardrum repair    . Percutaneous stent graft repair of infrarenal aaa  11/10    5.6 cm (T. early)  . Colonoscopy  2006, 2008, 2010, 05/27/2011    numerous adenomas - 13 in 2006, 3, 2008, 2 2012 (up to 1 cm), 4 diminutive adenomas, serrated adenomas 2012. Diverticulosis and hemorrhoids also.  . Eye surgery    . Cardiac surgery     Family History  Problem Relation Age of Onset  . Emphysema Father   . Cancer Brother     Lung  . Depression Sister   . Lung disease Sister     Fibrosis and died from pneumonia on 04-Apr-2013  . Colon cancer Neg Hx   . Dementia Neg Hx   . Alcohol abuse Neg Hx   . Drug abuse Neg Hx   . Paranoid behavior Neg Hx   .  Schizophrenia Neg Hx   . Anxiety disorder Neg Hx   . Bipolar disorder Neg Hx   . OCD Neg Hx   . Sexual abuse Neg Hx   . Physical abuse Neg Hx   . Seizures Neg Hx   . Kidney disease Sister    History  Substance Use Topics  . Smoking status: Current Every Day Smoker -- 1.00 packs/day for 60 years    Types: Cigarettes  . Smokeless tobacco: Never Used     Comment: smokes about 16-20 cigs a day as of 04/04/2013  . Alcohol Use: No    Review of Systems  Constitutional:       Per HPI, otherwise negative  HENT:       Per HPI, otherwise negative  Respiratory:       Per HPI, otherwise negative  Cardiovascular:       Per HPI, otherwise negative  Gastrointestinal: Negative for vomiting.  Endocrine:       Negative aside from HPI  Genitourinary:       Neg aside from HPI   Musculoskeletal:       Per HPI, otherwise negative  Skin: Negative.   Neurological: Negative for syncope.    Allergies  Lisinopril-hydrochlorothiazide and Doxycycline  Home Medications   Current Outpatient Rx  Name  Route  Sig  Dispense  Refill  . aspirin 81 MG tablet   Oral   Take 81 mg by mouth daily.           Marland Kitchen atenolol (TENORMIN) 25 MG tablet   Oral   Take 1 tablet (25 mg total) by mouth daily. .   90 tablet   3   . diazepam (VALIUM) 2 MG tablet   Oral   Take 1 tablet (2 mg total) by mouth 2 (two) times daily.   60 tablet   3   . ezetimibe-simvastatin (VYTORIN) 10-40 MG per tablet   Oral   Take 1 tablet by mouth at bedtime.   90 tablet   3   . FLUoxetine (PROZAC) 20 MG capsule   Oral   Take 1 capsule (20 mg total) by mouth daily.   90 capsule   1   . glimepiride (AMARYL) 1 MG tablet      Take 2 tablets by mouth daily.   180 tablet   3   . Glucose Blood (BLOOD GLUCOSE TEST STRIPS) STRP      Use to test blood sugar two times a week DX: 250.00   100 each   5     PATIENT USES ONE TOUCH ULTRA STRIPS   . hydrochlorothiazide (HYDRODIURIL) 25 MG tablet   Oral   Take 1 tablet  (25 mg total) by mouth daily.   90 tablet   3   . NIFEdipine (NIFEDICAL XL) 30 MG 24 hr tablet   Oral   Take 1 tablet (30 mg total) by mouth daily.   90 tablet   3   . nortriptyline (PAMELOR) 10 MG capsule   Oral   Take 1 capsule (10 mg total) by mouth at bedtime.   90 capsule   1   . pioglitazone (ACTOS) 30 MG tablet      TAKE 1 TABLET (30 MG TOTAL) BY MOUTH DAILY.   30 tablet   5   . ramipril (ALTACE) 10 MG capsule   Oral   Take 1 capsule (10 mg total) by mouth daily.   90 capsule   3    BP 153/81  Pulse 73  Temp(Src) 97.8 F (36.6 C) (Oral)  Resp 20  Ht 5\' 10"  (1.778 m)  Wt 208 lb (94.348 kg)  BMI 29.84 kg/m2  SpO2 96% Physical Exam  Nursing note and vitals reviewed. Constitutional: He is oriented to person, place, and time. He appears well-developed. No distress.  HENT:  Head: Normocephalic and atraumatic.  Eyes: Conjunctivae and EOM are normal.  Cardiovascular: Normal rate and regular rhythm.   Pulmonary/Chest: Effort normal. No stridor. No respiratory distress.  Abdominal: He exhibits no distension.  Musculoskeletal: He exhibits no edema.  Neurological: He is alert and oriented to person, place, and time.  Skin: Skin is warm and dry.  Psychiatric: He has a normal mood and affect.    ED Course  Procedures (including critical care time) Medications - No data to display  COORDINATION OF CARE: 6:48 PM- Discussed treatment plan with pt. Pt agrees to plan.    Labs Review Labs Reviewed  COMPREHENSIVE METABOLIC PANEL - Abnormal; Notable for the following:    Sodium 136 (*)    Chloride 95 (*)    Glucose, Bld 160 (*)    Total Bilirubin  0.2 (*)    GFR calc non Af Amer 56 (*)    GFR calc Af Amer 64 (*)    All other components within normal limits  CBC WITH DIFFERENTIAL  TROPONIN I   Imaging Review Dg Chest 2 View  11/15/2013   CLINICAL DATA:  Chest pain for 1 week.  EXAM: CHEST  2 VIEW  COMPARISON:  CT chest 10/08/2005. Single view of the chest  09/16/2009.  FINDINGS: The lungs are clear. Heart size is normal. No pneumothorax or pleural effusion. No focal bony abnormality.  IMPRESSION: No acute disease.   Electronically Signed   By: Inge Rise M.D.   On: 11/15/2013 16:24    EKG Interpretation    Date/Time:  Thursday November 15 2013 16:02:26 EST Ventricular Rate:  64 PR Interval:  184 QRS Duration: 84 QT Interval:  396 QTC Calculation: 408 R Axis:   27 Text Interpretation:  Normal sinus rhythm Nonspecific T wave abnormality Abnormal ECG When compared with ECG of 12-Sep-2009 11:21, Premature ventricular complexes are no longer Present Criteria for Inferior infarct are no longer Present Sinus rhythm T wave abnormality no PVC as in prior Abnormal ekg Confirmed by Carmin Muskrat  MD ([id number]) on 11/15/2013 5:38:57 PM            MDM   1. Chest pain    I personally performed the services described in this documentation, which was scribed in my presence. The recorded information has been reviewed and is accurate.   Patient presents with several days of discomfort, the patient thinks is consistent with congestion.  Given his age, colitides, evaluation was performed including lab, x-ray, EKG.  As though it was largely reassuring, and he was in no distress here with stable vital signs. Given the low suspicion for ongoing coronary ischemia or other occult systemic pathology, he was discharged after initiation of symptomatic therapy.  Patient has primary care with whom he will followup.  Carmin Muskrat, MD 11/15/13 [phone]

## 2013-11-15 NOTE — ED Notes (Signed)
Lt chest pain for 3 days, frequent belching , says he thought it was indigestion.

## 2013-11-15 NOTE — ED Notes (Signed)
MD at bedside. 

## 2013-11-20 ENCOUNTER — Ambulatory Visit (INDEPENDENT_AMBULATORY_CARE_PROVIDER_SITE_OTHER): Payer: Medicare Other | Admitting: Internal Medicine

## 2013-11-20 ENCOUNTER — Encounter: Payer: Self-pay | Admitting: Internal Medicine

## 2013-11-20 VITALS — BP 132/72 | HR 74 | Temp 98.0°F | Wt 196.4 lb

## 2013-11-20 DIAGNOSIS — K219 Gastro-esophageal reflux disease without esophagitis: Secondary | ICD-10-CM

## 2013-11-20 DIAGNOSIS — E119 Type 2 diabetes mellitus without complications: Secondary | ICD-10-CM

## 2013-11-20 DIAGNOSIS — I10 Essential (primary) hypertension: Secondary | ICD-10-CM | POA: Diagnosis not present

## 2013-11-20 DIAGNOSIS — R42 Dizziness and giddiness: Secondary | ICD-10-CM

## 2013-11-20 NOTE — Progress Notes (Signed)
Pre visit review using our clinic review tool, if applicable. No additional management support is needed unless otherwise documented below in the visit note. 

## 2013-11-20 NOTE — Progress Notes (Signed)
Subjective:     Patient ID: Barry [name], male   DOB: 1933-01-13, 80 y.o.   MRN: [id number]  HPI  Patient presented to the emergency department last week with symptoms of indigestion and left sided chest pain. Provider checked EKG, cxr, all wnl. No sx of indigestion since then.   Patient complains of "vertigo" when he stands up at church. Three weeks ago, he reduced his dose of atenolol from 25mg  to 12.5 mg. Vertigo is 70% better since halving the dose. (   Diabetic shoes - need a prescription for diabetic shoes. Foot exam shows normal vibration, fine touch, and proprioception throughout both feet. Missing R 5th toe after vascular sequela of cardiac catheterization years ago. No ulcers, onychomycosis.   Past Medical History  Diagnosis Date  . GERD (gastroesophageal reflux disease)   . Depression   . Tobacco abuse   . Hyperlipidemia   . Diabetes mellitus   . Allergy     rhinitis  . Hepatitis   . Hypertension   . PVD (peripheral vascular disease)   . AAA (abdominal aortic aneurysm) 3-09    4.7 cm  . Diverticulosis   . Personal history of colonic polyps     adenomas, serrated also   Past Surgical History  Procedure Laterality Date  . Surgical excision basal cell carcinoma    . Lower limb amputation, other toe 5th    . Tympanic eardrum repair    . Percutaneous stent graft repair of infrarenal aaa  11/10    5.6 cm (T. early)  . Colonoscopy  2006, 2008, 2010, 05/27/2011    numerous adenomas - 13 in 2006, 3, 2008, 2 2012 (up to 1 cm), 4 diminutive adenomas, serrated adenomas 2012. Diverticulosis and hemorrhoids also.  . Eye surgery    . Cardiac surgery     Family History  Problem Relation Age of Onset  . Emphysema Father   . Cancer Brother     Lung  . Depression Sister   . Lung disease Sister     Fibrosis and died from pneumonia on Apr 10, 2013  . Colon cancer Neg Hx   . Dementia Neg Hx   . Alcohol abuse Neg Hx   . Drug abuse Neg Hx   . Paranoid behavior Neg Hx   .  Schizophrenia Neg Hx   . Anxiety disorder Neg Hx   . Bipolar disorder Neg Hx   . OCD Neg Hx   . Sexual abuse Neg Hx   . Physical abuse Neg Hx   . Seizures Neg Hx   . Kidney disease Sister    History   Social History  . Marital Status: Widowed    Spouse Name: N/A    Number of Children: N/A  . Years of Education: N/A   Occupational History  . Not on file.   Social History Main Topics  . Smoking status: Current Every Day Smoker -- 1.00 packs/day for 60 years    Types: Cigarettes  . Smokeless tobacco: Never Used     Comment: smokes about 16-20 cigs a day as of 04/04/2013  . Alcohol Use: No  . Drug Use: No  . Sexual Activity: Yes    Partners: Female   Other Topics Concern  . 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 Lives alone. 1- step-daughter, 1 son - '68; 3 grandchildren. does odd-jobs, yard work      Review of Systems  Constitutional:  Negative for fever, chills, activity change and unexpected weight change.  HEENT:  Negative for hearing loss, ear pain, congestion, neck stiffness and postnasal drip. Negative for sore throat or swallowing problems. Negative for dental complaints.   Eyes: Negative for vision loss or change in visual acuity.  Respiratory: Negative for chest tightness and wheezing. Negative for DOE.   Cardiovascular: Negative for chest pain or palpitations.  Gastrointestinal: No change in bowel habit. No bloating or gas. No reflux or indigestion since hospitalization Musculoskeletal: Negative for myalgias, back pain, arthralgias and gait problem.  Neurological: Negative for dizziness, tremors, weakness and headaches.  Hematological: Negative for adenopathy.  Psychiatric/Behavioral: Negative for behavioral problems and dysphoric mood.      Objective:   Physical Exam Filed Vitals:   11/20/13 1611  BP: 132/72  Pulse: 74  Temp: 98 F (36.7 C)   Wt Readings from Last 3  Encounters:  11/20/13 196 lb 6.4 oz (89.086 kg)  11/15/13 208 lb (94.348 kg)  10/04/13 207 lb (93.895 kg)   Gen'l: Well nourished well developed     male in no acute distress  HEENT: Head: Normocephalic and atraumatic. Eyes: Conjunctivae and sclera clear. EOM intact. Pupils are equal, round, and reactive to light. Right eye exhibits no discharge. Left eye exhibits no discharge. Neck: No tracheal deviation present. No thyromegaly present.  Cardiovascular: Normal rate, regular rhythm, no gallop, no friction rub, no murmur heard.      Quiet precordium. 2+ radial and DP pulses . No carotid bruits Pulmonary/Chest: Effort normal. No respiratory distress or increased WOB, no wheezes, no rales. No chest wall deformity or CVAT. Musculoskeletal: He exhibits no edema and no tenderness.  Neurological: He is alert and oriented to person, place, and time. CN II-XII grossly intact.  Skin: Skin is warm and dry. No rash noted. No erythema.  Psychiatric: He has a normal mood and affect. His behavior is normal. Thought content normal.      Assessment:         Plan:     Patient seen and examined. Discussed symptoms and management. Agree with above note

## 2013-11-20 NOTE — Patient Instructions (Signed)
1. Blood pressure and "positional vertigo" doing fine on reduced dose of Atenolol.  2. Diabetic shoes - take them Rx, they'll send me the form.  3. Reflux - it is fine to use liquid antacid with or without anti-gas agent, e.g Mylanta or Mylanta II

## 2013-11-20 NOTE — Assessment & Plan Note (Signed)
Patient's blood pressure is under control on current regimen. Recently reduced dose of atenolol from 25mg  to 12.5mg  and noted a significant reduction in "vertigo." Will continue current regimen.

## 2013-11-21 NOTE — Assessment & Plan Note (Signed)
Recent bout of GERD for which he went to ED. Notes, labs, studies reviewed. He had normal EKG, negative troponin I, unremarkable exam. No evidence of cardiac event.  Plan Recommend the use of otc liquid antacid of choice w or w/lo mylicon.

## 2013-11-21 NOTE — Assessment & Plan Note (Signed)
Patient reports that by reducing beta blocker dose his symptoms of dizziness and positional vertigo are much improved.

## 2013-11-21 NOTE — Assessment & Plan Note (Signed)
Lab Results  Component Value Date   HGBA1C 8.0* 07/10/2013   Due for follow up lab. He is to continue present medication and redouble his efforts re: dietary adherence. Will Rx for diabetic shoes.

## 2013-11-23 ENCOUNTER — Telehealth: Payer: Self-pay

## 2013-11-23 NOTE — Telephone Encounter (Signed)
Relevant patient education mailed to patient.  

## 2013-12-24 ENCOUNTER — Telehealth: Payer: Self-pay | Admitting: Internal Medicine

## 2013-12-24 ENCOUNTER — Encounter: Payer: Self-pay | Admitting: Internal Medicine

## 2013-12-24 ENCOUNTER — Ambulatory Visit (INDEPENDENT_AMBULATORY_CARE_PROVIDER_SITE_OTHER): Payer: Medicare Other | Admitting: Internal Medicine

## 2013-12-24 VITALS — BP 168/88 | HR 65 | Temp 97.8°F | Wt 196.0 lb

## 2013-12-24 DIAGNOSIS — K219 Gastro-esophageal reflux disease without esophagitis: Secondary | ICD-10-CM

## 2013-12-24 DIAGNOSIS — E119 Type 2 diabetes mellitus without complications: Secondary | ICD-10-CM

## 2013-12-24 DIAGNOSIS — R079 Chest pain, unspecified: Secondary | ICD-10-CM | POA: Diagnosis not present

## 2013-12-24 DIAGNOSIS — I2510 Atherosclerotic heart disease of native coronary artery without angina pectoris: Secondary | ICD-10-CM

## 2013-12-24 DIAGNOSIS — E785 Hyperlipidemia, unspecified: Secondary | ICD-10-CM | POA: Diagnosis not present

## 2013-12-24 MED ORDER — OMEPRAZOLE 40 MG PO CPDR: 40.0000 mg | DELAYED_RELEASE_CAPSULE | Freq: Every day | ORAL | Status: DC

## 2013-12-24 NOTE — Patient Instructions (Signed)
Thanks for coming to see me.   You have atypical chest pain - it is most likely increased acid and reflux. Your EKG was normal. At the cardiac Catherization in '05 there was no obstructive (tight blockage) coronary disease.  Plan Omeprazole 40 mg every AM  Before breakfast  If this doesn't help with the problem the next step will be upper endoscopy with the GI Doctors.  Heart - You did have a negative cath in '05 but you are 53 years older, you are diabetic with hypertension and elevated cholesterol that is medically treated. In other words, you have multiple risk factors.  Plan Elective referral to Dr. Kirk Ruths at [facility] for an evaluation and consultation as to the best way to do risk stratification, that is to look for any problem before you have a serious problem.  if you develop exertional chest pain with shortness of breath, sweats, heavy weight on your chest you should have either a nuclear stress test or repeat cardiac catherization.

## 2013-12-24 NOTE — Telephone Encounter (Signed)
Pt states he is hurting in his chest on the left side.  It woke him up last night.  Going on for a week.  Went to the hospital recently and was told if could be indigestion.  Has been seen since the ER visit.  Is on for Tues. At 11:30.  Should he come this PM?  He can't come this morning.

## 2013-12-24 NOTE — Assessment & Plan Note (Addendum)
Pain of atypical chest pain most likely uncontrolled GERD/dyspepsia. Was previously on PPI  Plan Resume PPI therapy

## 2013-12-24 NOTE — Telephone Encounter (Signed)
Pt is scheduled at 4:30 today.  Told to arrive a little before.

## 2013-12-24 NOTE — Progress Notes (Signed)
Pre visit review using our clinic review tool, if applicable. No additional management support is needed unless otherwise documented below in the visit note. 

## 2013-12-24 NOTE — Progress Notes (Signed)
Subjective:    Patient ID: Barry [name], male    DOB: Mar 29, 1933, 80 y.o.   MRN: [id number]  HPI Barry [name] is seen acutely for a report of chest pain. He was last seen in Jan at ED and had negative enzymes. He reports that since last Friday he has had pain at anterior axillary line left. He has been awakened by pain. He does get relief with carbonated beverage which leads to eructation. He has not had any sign of bleeding: no hematemesis, hematochezia. In the past he was on prevacid for several years for similar discomfort.   Chart reviewed - He had a cath in '05 with non-obstructive disease: nl EF. He denies any exertional chest pain, DOE or other signs/symptoms of coronary obstruction.   Past Medical History  Diagnosis Date  . GERD (gastroesophageal reflux disease)   . Depression   . Tobacco abuse   . Hyperlipidemia   . Diabetes mellitus   . Allergy     rhinitis  . Hepatitis   . Hypertension   . PVD (peripheral vascular disease)   . AAA (abdominal aortic aneurysm) 3-09    4.7 cm  . Diverticulosis   . Personal history of colonic polyps     adenomas, serrated also   Past Surgical History  Procedure Laterality Date  . Surgical excision basal cell carcinoma    . Lower limb amputation, other toe 5th    . Tympanic eardrum repair    . Percutaneous stent graft repair of infrarenal aaa  11/10    5.6 cm (T. early)  . Colonoscopy  2006, 2008, 2010, 05/27/2011    numerous adenomas - 13 in 2006, 3, 2008, 2 2012 (up to 1 cm), 4 diminutive adenomas, serrated adenomas 2012. Diverticulosis and hemorrhoids also.  . Eye surgery    . Cardiac surgery     Family History  Problem Relation Age of Onset  . Emphysema Father   . Cancer Brother     Lung  . Depression Sister   . Lung disease Sister     Fibrosis and died from pneumonia on 04/07/13  . Colon cancer Neg Hx   . Dementia Neg Hx   . Alcohol abuse Neg Hx   . Drug abuse Neg Hx   . Paranoid behavior Neg Hx   . Schizophrenia  Neg Hx   . Anxiety disorder Neg Hx   . Bipolar disorder Neg Hx   . OCD Neg Hx   . Sexual abuse Neg Hx   . Physical abuse Neg Hx   . Seizures Neg Hx   . Kidney disease Sister    History   Social History  . Marital Status: Widowed    Spouse Name: N/A    Number of Children: N/A  . Years of Education: N/A   Occupational History  . Not on file.   Social History Main Topics  . Smoking status: Current Every Day Smoker -- 1.00 packs/day for 60 years    Types: Cigarettes  . Smokeless tobacco: Never Used     Comment: smokes about 16-20 cigs a day as of 04/04/2013  . Alcohol Use: No  . Drug Use: No  . Sexual Activity: Yes    Partners: Female   Other Topics Concern  . 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 Lives alone. 1- step-daughter, 1 son - '68; 3 grandchildren. does odd-jobs, yard work  Current Outpatient Prescriptions on File Prior to Visit  Medication Sig Dispense Refill  . aspirin 81 MG tablet Take 81 mg by mouth daily.        Marland Kitchen atenolol (TENORMIN) 25 MG tablet Take 12.5 mg by mouth daily.      . diazepam (VALIUM) 2 MG tablet Take 1 tablet (2 mg total) by mouth 2 (two) times daily.  60 tablet  3  . ezetimibe-simvastatin (VYTORIN) 10-40 MG per tablet Take 1 tablet by mouth at bedtime.  90 tablet  3  . FLUoxetine (PROZAC) 20 MG capsule Take 1 capsule (20 mg total) by mouth daily.  90 capsule  1  . glimepiride (AMARYL) 1 MG tablet Take 1 mg by mouth 2 (two) times daily.      . Glucose Blood (BLOOD GLUCOSE TEST STRIPS) STRP Use to test blood sugar two times a week DX: 250.00  100 each  5  . hydrochlorothiazide (HYDRODIURIL) 25 MG tablet Take 1 tablet (25 mg total) by mouth daily.  90 tablet  3  . NIFEdipine (NIFEDICAL XL) 30 MG 24 hr tablet Take 1 tablet (30 mg total) by mouth daily.  90 tablet  3  . nortriptyline (PAMELOR) 10 MG capsule Take 1 capsule (10 mg total) by mouth at bedtime.  90 capsule   1  . pioglitazone (ACTOS) 30 MG tablet TAKE 1 TABLET (30 MG TOTAL) BY MOUTH DAILY.  30 tablet  5  . ramipril (ALTACE) 10 MG capsule Take 1 capsule (10 mg total) by mouth daily.  90 capsule  3  . simethicone (MYLICON) 40 MC/8.0EM drops Take 0.6 mLs (40 mg total) by mouth 4 (four) times daily as needed (indigestion).  30 mL  0   No current facility-administered medications on file prior to visit.      Review of Systems System review is negative for any constitutional, cardiac, pulmonary, GI or neuro symptoms or complaints other than as described in the HPI.     Objective:   Physical Exam Filed Vitals:   12/24/13 1630  BP: 168/88  Pulse: 65  Temp: 97.8 F (36.6 C)   Wt Readings from Last 3 Encounters:  12/24/13 196 lb (88.905 kg)  11/20/13 196 lb 6.4 oz (89.086 kg)  11/15/13 208 lb (94.348 kg)   BP Readings from Last 3 Encounters:  12/24/13 168/88  11/20/13 132/72  11/15/13 153/81   Gen'l- WNWD man in no distress HEENT- C&S clear Cor - 2+ radial, quiet precordium, RRR, no M/R/G Pulm - CTAP Abd - BS+, mildly  Bloating Neuro - A&O x 3.   12 lead EKG - NSR, no ST elevations, no acute injury, no strain.      Assessment & Plan:

## 2013-12-24 NOTE — Telephone Encounter (Signed)
Ok to add to this PM - will need EKG on arrival. Needs to be here by 4: 30

## 2013-12-25 ENCOUNTER — Ambulatory Visit: Payer: Self-pay | Admitting: Internal Medicine

## 2014-01-25 ENCOUNTER — Encounter (HOSPITAL_COMMUNITY): Payer: Self-pay | Admitting: Psychiatry

## 2014-01-25 ENCOUNTER — Ambulatory Visit (INDEPENDENT_AMBULATORY_CARE_PROVIDER_SITE_OTHER): Payer: Medicare Other | Admitting: Psychiatry

## 2014-01-25 ENCOUNTER — Ambulatory Visit (HOSPITAL_COMMUNITY): Payer: Self-pay | Admitting: Psychiatry

## 2014-01-25 VITALS — BP 160/80 | Ht 70.0 in | Wt 195.0 lb

## 2014-01-25 DIAGNOSIS — F329 Major depressive disorder, single episode, unspecified: Secondary | ICD-10-CM | POA: Diagnosis not present

## 2014-01-25 DIAGNOSIS — F5105 Insomnia due to other mental disorder: Secondary | ICD-10-CM

## 2014-01-25 DIAGNOSIS — F341 Dysthymic disorder: Secondary | ICD-10-CM

## 2014-01-25 DIAGNOSIS — F3289 Other specified depressive episodes: Secondary | ICD-10-CM

## 2014-01-25 DIAGNOSIS — F418 Other specified anxiety disorders: Secondary | ICD-10-CM

## 2014-01-25 MED ORDER — DIAZEPAM 2 MG PO TABS: 2.0000 mg | ORAL_TABLET | Freq: Three times a day (TID) | ORAL | Status: DC

## 2014-01-25 MED ORDER — FLUOXETINE HCL 20 MG PO CAPS: 20.0000 mg | ORAL_CAPSULE | Freq: Every day | ORAL | Status: DC

## 2014-01-25 MED ORDER — DIAZEPAM 2 MG PO TABS: 2.0000 mg | ORAL_TABLET | Freq: Two times a day (BID) | ORAL | Status: DC

## 2014-01-25 MED ORDER — NORTRIPTYLINE HCL 10 MG PO CAPS: 10.0000 mg | ORAL_CAPSULE | Freq: Every day | ORAL | Status: DC

## 2014-01-25 NOTE — Progress Notes (Signed)
Patient ID: Barry Barry [name], male   DOB: 07/10/1933, 80 y.o.   MRN: [id number] Patient ID: Barry Barry [name], male   DOB: 03-06-1933, 80 y.o.   MRN: [id number] Patient ID: Barry Barry [name], male   DOB: [birth year], 80 y.o.   MRN: [id number] [facility] [id number] Progress Note Barry Barry [name] MRN: [id number] DOB: 1933-07-14 Age: 80 y.o.  Date: 01/25/2014 Start Time: 10:15 AM End Time: 10:30 AM  Chief Complaint: Chief Complaint  Patient presents with  . Anxiety  . Depression  . Follow-up   Subjective: "I've been having indigestion ".  This patient is a 80 year old widowed white male who lives alone in [place]. He has 2 children and 3 grandchildren who live in [place]. 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  The patient states that he developed anxiety and depression in his 40s. His depression worsened when his wife died suddenly 30 years ago. However he is doing very well on his current medications. He denies being depressed or anxious and he is sleeping quite well. He spends most of his time with her grandchildren and really enjoys it. His social life is quite active. He has no side effects or complaints regarding his medications  The patient returns after 3 months. He complains of epigastric pain that goes under his left rib cage. He's been evaluated and is thought to be noncardiac. He's been changing his diet and it's been getting a little bit better he's also was told to see his primary doctor and make a referral to a GI specialist. His mood is generally been good but he is a little bit more anxious lately about the pain and asked if we can use one extra Valium per day. I think this is reasonable.  Current psychiatric medication Valium 2 mg twice a day Nortriptyline 10 mg daily Prozac 20 mg daily which is prescribed by primary care physician  Vitals: BP 160/80  Ht 5\' 10"  (1.778 m)  Wt 195 lb (88.451 kg)  BMI 27.98  kg/m2  Allergies: Allergies  Allergen Reactions  . Lisinopril-Hydrochlorothiazide Swelling    ZESTRIL  . Doxycycline Rash   Medical History: Past Medical History  Diagnosis Date  . GERD (gastroesophageal reflux disease)   . Depression   . Tobacco abuse   . Hyperlipidemia   . Diabetes mellitus   . Allergy     rhinitis  . Hepatitis   . Hypertension   . PVD (peripheral vascular disease)   . AAA (abdominal aortic aneurysm) 3-09    4.7 cm  . Diverticulosis   . Personal history of colonic polyps     adenomas, serrated also  Patient has history of diabetes mellitus, hypertension, peripheral vascular disease, abdominal aortic aneurysm, diverticulosis, GERD and hyperlipidemia.  He sees Dr. Timoteo Gaul regularly and will be going back soon for blood work.   Surgical History: Past Surgical History  Procedure Laterality Date  . Surgical excision basal cell carcinoma    . Lower limb amputation, other toe 5th    . Tympanic eardrum repair    . Percutaneous stent graft repair of infrarenal aaa  11/10    5.6 cm (T. early)  . Colonoscopy  2006, 2008, 2010, 05/27/2011    numerous adenomas - 13 in 2006, 3, 2008, 2 2012 (up to 1 cm), 4 diminutive adenomas, serrated adenomas 2012. Diverticulosis and hemorrhoids also.  . Eye surgery    . Cardiac surgery     Family History: family history  includes Cancer in his brother; Depression in his sister; Emphysema in his father; Kidney disease in his sister; Lung disease in his sister. There is no history of Colon cancer, Dementia, Alcohol abuse, Drug abuse, Paranoid behavior, Schizophrenia, Anxiety disorder, Bipolar disorder, OCD, Sexual abuse, Physical abuse, or Seizures. Reviewed and the history of his sisters was clarified and the passing of [name] recorded.  Mental status examination Patient is well groomed well dressed.  He is pleasant, but a bit anxious.  He maintained a good eye contact. His speech is soft clear and coherent. His thought process is  logical linear and goal-directed. He denies any active or passive suicidal thoughts or homicidal thoughts.  He denies any auditory or visual hallucination.  He described his mood is good and his affect is mood congruent.  There no psychotic symptoms present. He's alert and oriented x3. His insight judgment and impulse control is okay.  Lab Results:  Results for orders placed during the hospital encounter of 11/15/13 (from the past 8736 hour(s))  CBC WITH DIFFERENTIAL   Collection Time    11/15/13  4:34 PM      Result Value Ref Range   WBC 6.3  4.0 - 10.5 K/uL   RBC 5.13  4.22 - 5.81 MIL/uL   Hemoglobin 16.8  13.0 - 17.0 g/dL   HCT 48.6  39.0 - 52.0 %   MCV 94.7  78.0 - 100.0 fL   MCH 32.7  26.0 - 34.0 pg   MCHC 34.6  30.0 - 36.0 g/dL   RDW 14.0  11.5 - 15.5 %   Platelets 208  150 - 400 K/uL   Neutrophils Relative % 45  43 - 77 %   Neutro Abs 2.8  1.7 - 7.7 K/uL   Lymphocytes Relative 37  12 - 46 %   Lymphs Abs 2.3  0.7 - 4.0 K/uL   Monocytes Relative 12  3 - 12 %   Monocytes Absolute 0.8  0.1 - 1.0 K/uL   Eosinophils Relative 5  0 - 5 %   Eosinophils Absolute 0.3  0.0 - 0.7 K/uL   Basophils Relative 1  0 - 1 %   Basophils Absolute 0.1  0.0 - 0.1 K/uL  COMPREHENSIVE METABOLIC PANEL   Collection Time    11/15/13  4:34 PM      Result Value Ref Range   Sodium 136 (*) 137 - 147 mEq/L   Potassium 4.0  3.7 - 5.3 mEq/L   Chloride 95 (*) 96 - 112 mEq/L   CO2 30  19 - 32 mEq/L   Glucose, Bld 160 (*) 70 - 99 mg/dL   BUN 23  6 - 23 mg/dL   Creatinine, Ser 1.21  0.50 - 1.35 mg/dL   Calcium 9.9  8.4 - 10.5 mg/dL   Total Protein 6.9  6.0 - 8.3 g/dL   Albumin 3.5  3.5 - 5.2 g/dL   AST 18  0 - 37 U/L   ALT 16  0 - 53 U/L   Alkaline Phosphatase 46  39 - 117 U/L   Total Bilirubin 0.2 (*) 0.3 - 1.2 mg/dL   GFR calc non Af Amer 56 (*) >90 mL/min   GFR calc Af Amer 64 (*) >90 mL/min  TROPONIN I   Collection Time    11/15/13  4:34 PM      Result Value Ref Range   Troponin I <0.30  <0.30  ng/mL  Results for orders placed in visit on 07/10/13 (from the  past 8736 hour(s))  COMPREHENSIVE METABOLIC PANEL   Collection Time    07/10/13 12:13 PM      Result Value Ref Range   Sodium 137  135 - 145 mEq/L   Potassium 4.2  3.5 - 5.1 mEq/L   Chloride 100  96 - 112 mEq/L   CO2 30  19 - 32 mEq/L   Glucose, Bld 184 (*) 70 - 99 mg/dL   BUN 19  6 - 23 mg/dL   Creatinine, Ser 1.0  0.4 - 1.5 mg/dL   Total Bilirubin 0.4  0.3 - 1.2 mg/dL   Alkaline Phosphatase 47  39 - 117 U/L   AST 20  0 - 37 U/L   ALT 16  0 - 53 U/L   Total Protein 6.9  6.0 - 8.3 g/dL   Albumin 3.7  3.5 - 5.2 g/dL   Calcium 9.8  8.4 - 10.5 mg/dL   GFR 76.82  >60.00 mL/min  HEMOGLOBIN A1C   Collection Time    07/10/13 12:13 PM      Result Value Ref Range   Hemoglobin A1C 8.0 (*) 4.6 - 6.5 %  PCP draws routine labs and nothing is emerging as of concern.  Assessment Axis I Depressive disorder NOS Axis II deferred Axis III see medical history Axis IV mild to moderate Axis V 65-70  Plan/Discussion: I took his vitals.  I reviewed CC, tobacco/med/surg Hx, meds effects/ side effects, problem list, therapies and responses as well as current situation/symptoms discussed options. Continue current effective medications but increase Valium to 2 mg 3 times a day He'll return in 3 months See orders and pt instructions for more details.  MEDICATIONS this encounter: Meds ordered this encounter  Medications  . FLUoxetine (PROZAC) 20 MG capsule    Sig: Take 1 capsule (20 mg total) by mouth daily.    Dispense:  90 capsule    Refill:  1  . nortriptyline (PAMELOR) 10 MG capsule    Sig: Take 1 capsule (10 mg total) by mouth at bedtime.    Dispense:  90 capsule    Refill:  1  . diazepam (VALIUM) 2 MG tablet    Sig: Take 1 tablet (2 mg total) by mouth 2 (two) times daily.    Dispense:  60 tablet    Refill:  3  . DISCONTD: diazepam (VALIUM) 2 MG tablet    Sig: Take 1 tablet (2 mg total) by mouth 3 (three) times daily.     Dispense:  90 tablet    Refill:  3  . diazepam (VALIUM) 2 MG tablet    Sig: Take 1 tablet (2 mg total) by mouth 3 (three) times daily.    Dispense:  90 tablet    Refill:  3    Medical Decision Making Problem Points:  Established problem, stable/improving (1), Review of last therapy session (1) and Review of psycho-social stressors (1) Data Points:  Review or order clinical lab tests (1) Review of medication regiment & side effects (2)  I certify that outpatient services furnished can reasonably be expected to improve the patient's condition.   [name], MD

## 2014-01-28 ENCOUNTER — Ambulatory Visit (HOSPITAL_COMMUNITY): Payer: Self-pay | Admitting: Psychiatry

## 2014-01-31 ENCOUNTER — Encounter: Payer: Self-pay | Admitting: Cardiology

## 2014-01-31 ENCOUNTER — Ambulatory Visit (INDEPENDENT_AMBULATORY_CARE_PROVIDER_SITE_OTHER): Payer: Medicare Other | Admitting: Cardiology

## 2014-01-31 VITALS — BP 166/86 | HR 63 | Ht 70.0 in | Wt 195.0 lb

## 2014-01-31 DIAGNOSIS — R079 Chest pain, unspecified: Secondary | ICD-10-CM | POA: Insufficient documentation

## 2014-01-31 DIAGNOSIS — F17200 Nicotine dependence, unspecified, uncomplicated: Secondary | ICD-10-CM | POA: Diagnosis not present

## 2014-01-31 DIAGNOSIS — I2510 Atherosclerotic heart disease of native coronary artery without angina pectoris: Secondary | ICD-10-CM | POA: Diagnosis not present

## 2014-01-31 DIAGNOSIS — I714 Abdominal aortic aneurysm, without rupture, unspecified: Secondary | ICD-10-CM

## 2014-01-31 DIAGNOSIS — E785 Hyperlipidemia, unspecified: Secondary | ICD-10-CM | POA: Diagnosis not present

## 2014-01-31 DIAGNOSIS — I10 Essential (primary) hypertension: Secondary | ICD-10-CM | POA: Diagnosis not present

## 2014-01-31 NOTE — Assessment & Plan Note (Signed)
Patient counseled on discontinuing. 

## 2014-01-31 NOTE — Assessment & Plan Note (Signed)
Blood pressure mildly elevated but he follows this closely at home and it is typically controlled. Continue present medications.

## 2014-01-31 NOTE — Assessment & Plan Note (Signed)
Followed by vascular surgery. 

## 2014-01-31 NOTE — Assessment & Plan Note (Signed)
Symptoms most consistent with GI etiology. No exertional chest pain. Continue medical therapy for history of mild coronary disease.

## 2014-01-31 NOTE — Progress Notes (Signed)
HPI: 81 year old male for evaluation of chest pain. Patient underwent cardiac catheterization in 2005 he was found to have nonobstructive disease other than a 90% stenosis in a small branch of the right coronary artery. He was noted to have an 80% iliac stenosis and an abdominal aortic aneurysm. Procedure complicated by peripheral emboli requiring amputation of a digit. Patient has had stent graft of abdominal aortic aneurysm. CTA of abdomen in April of 2013 showed stable stent graft and left renal artery stenosis. Patient has dyspnea with more extreme activities but not routine activities. No orthopnea, PND or pedal edema, palpitations, syncope or exertional chest pain. He occasionally has indigestion in the left lateral chest area. It typically occurs after eating or at night. Relieved with Gas-X. No associated symptoms.   Current Outpatient Prescriptions  Medication Sig Dispense Refill  . aspirin 81 MG tablet Take 81 mg by mouth daily.        Marland Kitchen atenolol (TENORMIN) 25 MG tablet Take 12.5 mg by mouth daily.      . diazepam (VALIUM) 2 MG tablet Take 1 tablet (2 mg total) by mouth 2 (two) times daily.  60 tablet  3  . ezetimibe-simvastatin (VYTORIN) 10-40 MG per tablet Take 0.5 tablets by mouth at bedtime.      Marland Kitchen FLUoxetine (PROZAC) 20 MG capsule Take 1 capsule (20 mg total) by mouth daily.  90 capsule  1  . glimepiride (AMARYL) 1 MG tablet Take 1 mg by mouth 2 (two) times daily.      . Glucose Blood (BLOOD GLUCOSE TEST STRIPS) STRP Use to test blood sugar two times a week DX: 250.00  100 each  5  . hydrochlorothiazide (HYDRODIURIL) 25 MG tablet Take 1 tablet (25 mg total) by mouth daily.  90 tablet  3  . NIFEdipine (NIFEDICAL XL) 30 MG 24 hr tablet Take 1 tablet (30 mg total) by mouth daily.  90 tablet  3  . nortriptyline (PAMELOR) 10 MG capsule Take 1 capsule (10 mg total) by mouth at bedtime.  90 capsule  1  . omeprazole (PRILOSEC) 40 MG capsule Take 1 capsule (40 mg total) by mouth daily.  Before breakfast  90 capsule  3  . pioglitazone (ACTOS) 30 MG tablet TAKE 1 TABLET (30 MG TOTAL) BY MOUTH DAILY.  30 tablet  5  . ramipril (ALTACE) 10 MG capsule Take 1 capsule (10 mg total) by mouth daily.  90 capsule  3  . simethicone (MYLICON) 40 RC/7.8LF drops Take 0.6 mLs (40 mg total) by mouth 4 (four) times daily as needed (indigestion).  30 mL  0   No current facility-administered medications for this visit.    Allergies  Allergen Reactions  . Lisinopril-Hydrochlorothiazide Swelling    ZESTRIL  . Doxycycline Rash    Past Medical History  Diagnosis Date  . GERD (gastroesophageal reflux disease)   . Depression   . Tobacco abuse   . Hyperlipidemia   . Diabetes mellitus   . Allergy     rhinitis  . Hepatitis   . Hypertension   . PVD (peripheral vascular disease)   . AAA (abdominal aortic aneurysm) 3-09    4.7 cm  . Diverticulosis   . Personal history of colonic polyps     adenomas, serrated also  . Nephrolithiasis     Past Surgical History  Procedure Laterality Date  . Surgical excision basal cell carcinoma    . Lower limb amputation, other toe 5th    . Tympanic eardrum  repair    . Percutaneous stent graft repair of infrarenal aaa  11/10    5.6 cm (T. early)  . Colonoscopy  2006, 2008, 2010, 05/27/2011    numerous adenomas - 13 in 2006, 3, 2008, 2 2012 (up to 1 cm), 4 diminutive adenomas, serrated adenomas 2012. Diverticulosis and hemorrhoids also.  . Eye surgery    . Kidney stone operation      History   Social History  . Marital Status: Widowed    Spouse Name: N/A    Number of Children: 2  . Years of Education: N/A   Occupational History  .     Social History Main Topics  . Smoking status: Current Every Day Smoker -- 1.00 packs/day for 60 years    Types: Cigarettes  . Smokeless tobacco: Never Used     Comment: smokes about 16-20 cigs a day as of 04/04/2013  . Alcohol Use: Yes  . Drug Use: No  . Sexual Activity: Yes    Partners: Female   Other  Topics Concern  . 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 retired 71 years - keeping busy. Lives alone. 1- step-daughter, 1 son - '68; 3 grandchildren. does odd-jobs, yard work    Family History  Problem Relation Age of Onset  . Emphysema Father   . Cancer Brother     Lung  . Depression Sister   . Lung disease Sister     Fibrosis and died from pneumonia on 2013/04/06  . Colon cancer Neg Hx   . Dementia Neg Hx   . Alcohol abuse Neg Hx   . Drug abuse Neg Hx   . Paranoid behavior Neg Hx   . Schizophrenia Neg Hx   . Anxiety disorder Neg Hx   . Bipolar disorder Neg Hx   . OCD Neg Hx   . Sexual abuse Neg Hx   . Physical abuse Neg Hx   . Seizures Neg Hx   . Kidney disease Sister     ROS: no fevers or chills, productive cough, hemoptysis, dysphasia, odynophagia, melena, hematochezia, dysuria, hematuria, rash, seizure activity, orthopnea, PND, pedal edema, claudication. Remaining systems are negative.  Physical Exam:   Blood pressure 166/86, pulse 63, height 5\' 10"  (1.778 m), weight 195 lb (88.451 kg).  General:  Well developed/well nourished in NAD Skin warm/dry Patient not depressed No peripheral clubbing Back-normal HEENT-normal/normal eyelids Neck supple/normal carotid upstroke bilaterally; no bruits; no JVD; no thyromegaly chest - CTA/ normal expansion CV - RRR/normal S1 and S2; no murmurs, rubs or gallops;  PMI nondisplaced Abdomen -NT/ND, no HSM, no mass, + bowel sounds, no bruit 2+ femoral pulses, no bruits Ext-no edema, no chords, 2+ PT, Status post amputation fifth digit right lower extremity Neuro-grossly nonfocal  ECG 12/24/2013-sinus rhythm, PAC, nonspecific ST changes.

## 2014-01-31 NOTE — Patient Instructions (Signed)
Your physician wants you to follow-up in: ONE YEAR WITH DR CRENSHAW You will receive a reminder letter in the mail two months in advance. If you don't receive a letter, please call our office to schedule the follow-up appointment.  

## 2014-01-31 NOTE — Assessment & Plan Note (Signed)
Continue aspirin and statin. 

## 2014-01-31 NOTE — Assessment & Plan Note (Signed)
Continue present medications. He has been intolerant to Lipitor and Crestor previously.

## 2014-02-22 ENCOUNTER — Encounter (HOSPITAL_COMMUNITY): Payer: Self-pay | Admitting: Emergency Medicine

## 2014-02-22 ENCOUNTER — Emergency Department (HOSPITAL_COMMUNITY)
Admission: EM | Admit: 2014-02-22 | Discharge: 2014-02-22 | Disposition: A | Discharge status: Home / Self Care | Payer: Medicare Other | Attending: Emergency Medicine | Admitting: Emergency Medicine

## 2014-02-22 ENCOUNTER — Emergency Department (HOSPITAL_COMMUNITY): Payer: Medicare Other

## 2014-02-22 DIAGNOSIS — F329 Major depressive disorder, single episode, unspecified: Secondary | ICD-10-CM | POA: Insufficient documentation

## 2014-02-22 DIAGNOSIS — E785 Hyperlipidemia, unspecified: Secondary | ICD-10-CM | POA: Insufficient documentation

## 2014-02-22 DIAGNOSIS — Z87442 Personal history of urinary calculi: Secondary | ICD-10-CM | POA: Diagnosis not present

## 2014-02-22 DIAGNOSIS — Z860100 Personal history of colon polyps, unspecified: Secondary | ICD-10-CM | POA: Insufficient documentation

## 2014-02-22 DIAGNOSIS — R42 Dizziness and giddiness: Secondary | ICD-10-CM | POA: Insufficient documentation

## 2014-02-22 DIAGNOSIS — Z79899 Other long term (current) drug therapy: Secondary | ICD-10-CM | POA: Insufficient documentation

## 2014-02-22 DIAGNOSIS — K219 Gastro-esophageal reflux disease without esophagitis: Secondary | ICD-10-CM | POA: Diagnosis not present

## 2014-02-22 DIAGNOSIS — I10 Essential (primary) hypertension: Secondary | ICD-10-CM | POA: Diagnosis not present

## 2014-02-22 DIAGNOSIS — E119 Type 2 diabetes mellitus without complications: Secondary | ICD-10-CM | POA: Diagnosis not present

## 2014-02-22 DIAGNOSIS — R51 Headache: Secondary | ICD-10-CM | POA: Diagnosis not present

## 2014-02-22 DIAGNOSIS — Z7982 Long term (current) use of aspirin: Secondary | ICD-10-CM | POA: Diagnosis not present

## 2014-02-22 DIAGNOSIS — F17200 Nicotine dependence, unspecified, uncomplicated: Secondary | ICD-10-CM | POA: Diagnosis not present

## 2014-02-22 DIAGNOSIS — J449 Chronic obstructive pulmonary disease, unspecified: Secondary | ICD-10-CM | POA: Diagnosis not present

## 2014-02-22 DIAGNOSIS — F3289 Other specified depressive episodes: Secondary | ICD-10-CM | POA: Insufficient documentation

## 2014-02-22 DIAGNOSIS — Z8619 Personal history of other infectious and parasitic diseases: Secondary | ICD-10-CM | POA: Insufficient documentation

## 2014-02-22 LAB — BASIC METABOLIC PANEL
BUN: 27 mg/dL — ABNORMAL HIGH (ref 6–23)
CO2: 27 mEq/L (ref 19–32)
Calcium: 9.3 mg/dL (ref 8.4–10.5)
Chloride: 101 mEq/L (ref 96–112)
Creatinine, Ser: 1.05 mg/dL (ref 0.50–1.35)
GFR calc Af Amer: 76 mL/min — ABNORMAL LOW (ref >90)
GFR calc non Af Amer: 66 mL/min — ABNORMAL LOW (ref >90)
Glucose, Bld: 141 mg/dL — ABNORMAL HIGH (ref 70–99)
Potassium: 3.7 mEq/L (ref 3.7–5.3)
Sodium: 139 mEq/L (ref 137–147)

## 2014-02-22 LAB — CBC WITH DIFFERENTIAL/PLATELET
Basophils Absolute: 0 10*3/uL (ref 0.0–0.1)
Basophils Relative: 0 % (ref 0–1)
Eosinophils Absolute: 0.1 10*3/uL (ref 0.0–0.7)
Eosinophils Relative: 2 % (ref 0–5)
HCT: 44.1 % (ref 39.0–52.0)
Hemoglobin: 15.2 g/dL (ref 13.0–17.0)
Lymphocytes Relative: 36 % (ref 12–46)
Lymphs Abs: 2.2 10*3/uL (ref 0.7–4.0)
MCH: 32.8 pg (ref 26.0–34.0)
MCHC: 34.5 g/dL (ref 30.0–36.0)
MCV: 95.2 fL (ref 78.0–100.0)
Monocytes Absolute: 0.7 10*3/uL (ref 0.1–1.0)
Monocytes Relative: 11 % (ref 3–12)
Neutro Abs: 3.1 10*3/uL (ref 1.7–7.7)
Neutrophils Relative %: 51 % (ref 43–77)
Platelets: 221 10*3/uL (ref 150–400)
RBC: 4.63 MIL/uL (ref 4.22–5.81)
RDW: 14.5 % (ref 11.5–15.5)
WBC: 6.1 10*3/uL (ref 4.0–10.5)

## 2014-02-22 LAB — TROPONIN I: Troponin I: <0.3 ng/mL (ref <0.30)

## 2014-02-22 LAB — CBG MONITORING, ED: GLUCOSE-CAPILLARY: 127 mg/dL — AB (ref 70–99)

## 2014-02-22 MED ORDER — LORAZEPAM 1 MG PO TABS
1.0000 mg | ORAL_TABLET | Freq: Once | ORAL | Status: AC
  Administered 2014-02-22: 1 mg via ORAL
  Filled 2014-02-22: qty 1

## 2014-02-22 NOTE — ED Notes (Signed)
Pt reports woke up not feeling well this am, c/o dizziness.  Reports bp has been elevated all day.  Denies any headache or chest pain at this time.    Says just feels dizzy.  Reports has been taking his medication as prescribed.  Pt took 2 advil approx 1 hour ago for headache and it relieved the pain.  Pt reports hurt left rib working on his lawnmower a few days ago and the ibuprofen helped that pain as well.  Says area is sore.

## 2014-02-22 NOTE — Discharge Instructions (Signed)
Follow up with a family md next week °

## 2014-02-22 NOTE — ED Provider Notes (Signed)
CSN: [id number]     Arrival date & time 02/22/14  1520 History   First MD Initiated Contact with Patient 02/22/14 1533    This chart was scribed for Maudry Diego, MD by Terressa Koyanagi, ED Scribe. This patient was seen in room APA02/APA02 and the patient's care was started at 3:40 PM.  Chief Complaint  Patient presents with  . Dizziness  . Hypertension   Patient is a 80 y.o. male presenting with dizziness. The history is provided by the patient. No language interpreter was used.  Dizziness Quality:  Lightheadedness Severity:  Unable to specify Onset quality:  Sudden Timing:  Intermittent Progression:  Unchanged Chronicity:  New Context: inactivity   Relieved by:  Nothing Worsened by:  Movement and sitting upright Associated symptoms: headaches   Associated symptoms: no blood in stool and no chest pain    HPI Comments: Barry [name] is a 80 y.o. male who presents to the Emergency [name] complaining of dizziness and associated HA (which has not resolved after taking some ibuprofen) onset earlier today. Pt also complains of lower, left sided abd pain due to an injury sustained couple of days ago while working on his lawnmower.    Past Medical History  Diagnosis Date  . GERD (gastroesophageal reflux disease)   . Depression   . Tobacco abuse   . Hyperlipidemia   . Diabetes mellitus   . Allergy     rhinitis  . Hepatitis   . Hypertension   . PVD (peripheral vascular disease)   . AAA (abdominal aortic aneurysm) 3-09    4.7 cm  . Diverticulosis   . Personal history of colonic polyps     adenomas, serrated also  . Nephrolithiasis    Past Surgical History  Procedure Laterality Date  . Surgical excision basal cell carcinoma    . Lower limb amputation, other toe 5th    . Tympanic eardrum repair    . Percutaneous stent graft repair of infrarenal aaa  11/10    5.6 cm (T. early)  . Colonoscopy  2006, 2008, 2010, 05/27/2011    numerous adenomas - 13 in 2006, 3, 2008, 2 2012  (up to 1 cm), 4 diminutive adenomas, serrated adenomas 2012. Diverticulosis and hemorrhoids also.  . Eye surgery    . Kidney stone operation     Family History  Problem Relation Age of Onset  . Emphysema Father   . Cancer Brother     Lung  . Depression Sister   . Lung disease Sister     Fibrosis and died from pneumonia on 2013/04/04  . Colon cancer Neg Hx   . Dementia Neg Hx   . Alcohol abuse Neg Hx   . Drug abuse Neg Hx   . Paranoid behavior Neg Hx   . Schizophrenia Neg Hx   . Anxiety disorder Neg Hx   . Bipolar disorder Neg Hx   . OCD Neg Hx   . Sexual abuse Neg Hx   . Physical abuse Neg Hx   . Seizures Neg Hx   . Kidney disease Sister    History  Substance Use Topics  . Smoking status: Current Every Day Smoker -- 1.00 packs/day for 60 years    Types: Cigarettes  . Smokeless tobacco: Never Used     Comment: smokes about 16-20 cigs a day as of 04/04/2013  . Alcohol Use: No    Review of Systems  Constitutional: Negative for appetite change.  HENT: Negative for ear discharge.  Eyes: Negative for discharge.  Respiratory: Negative for chest tightness.   Cardiovascular: Negative for chest pain.  Gastrointestinal: Negative for blood in stool.  Genitourinary: Negative for frequency and hematuria.  Skin: Negative for rash.  Neurological: Positive for dizziness and headaches.  Psychiatric/Behavioral: Negative for hallucinations.      Allergies  Lisinopril-hydrochlorothiazide and Doxycycline  Home Medications   Prior to Admission medications   Medication Sig Start Date End Date Taking? Authorizing Provider  aspirin 81 MG tablet Take 81 mg by mouth daily.      Historical Provider, MD  atenolol (TENORMIN) 25 MG tablet Take 12.5 mg by mouth daily.    Historical Provider, MD  diazepam (VALIUM) 2 MG tablet Take 1 tablet (2 mg total) by mouth 2 (two) times daily. 01/25/14   [name], MD  ezetimibe-simvastatin (VYTORIN) 10-40 MG per tablet Take 0.5 tablets by mouth at  bedtime. 11/12/13   Neena Rhymes, MD  FLUoxetine (PROZAC) 20 MG capsule Take 1 capsule (20 mg total) by mouth daily. 01/25/14   [name], MD  glimepiride (AMARYL) 1 MG tablet Take 1 mg by mouth 2 (two) times daily.    Historical Provider, MD  Glucose Blood (BLOOD GLUCOSE TEST STRIPS) STRP Use to test blood sugar two times a week DX: 250.00 07/24/13   Neena Rhymes, MD  hydrochlorothiazide (HYDRODIURIL) 25 MG tablet Take 1 tablet (25 mg total) by mouth daily. 09/18/13   Neena Rhymes, MD  NIFEdipine (NIFEDICAL XL) 30 MG 24 hr tablet Take 1 tablet (30 mg total) by mouth daily. 08/14/13   Neena Rhymes, MD  nortriptyline (PAMELOR) 10 MG capsule Take 1 capsule (10 mg total) by mouth at bedtime. 01/25/14   [name], MD  omeprazole (PRILOSEC) 40 MG capsule Take 1 capsule (40 mg total) by mouth daily. Before breakfast 12/24/13   Neena Rhymes, MD  pioglitazone (ACTOS) 30 MG tablet TAKE 1 TABLET (30 MG TOTAL) BY MOUTH DAILY. 09/25/13   Neena Rhymes, MD  ramipril (ALTACE) 10 MG capsule Take 1 capsule (10 mg total) by mouth daily. 11/08/13   Neena Rhymes, MD  simethicone (MYLICON) 40 IR/5.1OA drops Take 0.6 mLs (40 mg total) by mouth 4 (four) times daily as needed (indigestion). 11/15/13   Carmin Muskrat, MD   Triage Vitals: BP 182/88  Pulse 68  Temp(Src) 98.1 F (36.7 C) (Oral)  Resp 18  Ht 5\' 10"  (1.778 m)  Wt 195 lb (88.451 kg)  BMI 27.98 kg/m2  SpO2 98% Physical Exam  Constitutional: He is oriented to person, place, and time. He appears well-developed.  HENT:  Head: Normocephalic.  Eyes: Conjunctivae and EOM are normal. No scleral icterus.  Neck: Neck supple. No thyromegaly present.  Cardiovascular: Normal rate and regular rhythm.  Exam reveals no gallop and no friction rub.   No murmur heard. Pulmonary/Chest: No stridor. He has no wheezes. He has no rales. He exhibits no tenderness.  Abdominal: He exhibits no distension. There is no tenderness. There is no rebound.   Musculoskeletal: Normal range of motion. He exhibits no edema.  Lymphadenopathy:    He has no cervical adenopathy.  Neurological: He is oriented to person, place, and time. He exhibits normal muscle tone. Coordination normal.  Skin: No rash noted. No erythema.  Psychiatric: He has a normal mood and affect. His behavior is normal.    ED Course  Procedures (including critical care time) DIAGNOSTIC STUDIES: Oxygen Saturation is 98% on RA, normal by my interpretation.  COORDINATION OF CARE:  3:47 PM-Discussed treatment plan which includes labs, imaging and meds with pt at bedside and pt agreed to plan.  5:17 PM- Recheck- Discussed lab results and discharge with pt at bedside and pt agrees to plan. Further, advised pt to follow up with PCP.   Labs Review Labs Reviewed  CBC WITH DIFFERENTIAL  BASIC METABOLIC PANEL  TROPONIN I    Imaging Review No results found.   EKG Interpretation None      MDM   Final diagnoses:  None    The chart was scribed for me under my direct supervision.  I personally performed the history, physical, and medical decision making and all procedures in the evaluation of this patient.Maudry Diego, MD 02/22/14 [phone]

## 2014-02-27 ENCOUNTER — Ambulatory Visit (HOSPITAL_COMMUNITY): Payer: Self-pay | Admitting: Psychiatry

## 2014-02-28 ENCOUNTER — Ambulatory Visit (INDEPENDENT_AMBULATORY_CARE_PROVIDER_SITE_OTHER): Payer: Medicare Other | Admitting: Psychiatry

## 2014-02-28 ENCOUNTER — Encounter (HOSPITAL_COMMUNITY): Payer: Self-pay | Admitting: Psychiatry

## 2014-02-28 VITALS — BP 160/88 | Ht 67.0 in | Wt 197.0 lb

## 2014-02-28 DIAGNOSIS — F341 Dysthymic disorder: Secondary | ICD-10-CM

## 2014-02-28 DIAGNOSIS — F329 Major depressive disorder, single episode, unspecified: Secondary | ICD-10-CM

## 2014-02-28 DIAGNOSIS — F3289 Other specified depressive episodes: Secondary | ICD-10-CM | POA: Diagnosis not present

## 2014-02-28 MED ORDER — FLUOXETINE HCL 20 MG PO CAPS: 20.0000 mg | ORAL_CAPSULE | Freq: Every morning | ORAL | Status: DC

## 2014-02-28 MED ORDER — LORAZEPAM 1 MG PO TABS: 1.0000 mg | ORAL_TABLET | Freq: Three times a day (TID) | ORAL | Status: DC

## 2014-02-28 NOTE — Progress Notes (Signed)
Patient ID: Barry [name], male   DOB: [birth year], 80 y.o.   MRN: [id number] Patient ID: Barry [name], male   DOB: Jan 12, 1933, 80 y.o.   MRN: [id number] Patient ID: Barry [name], male   DOB: 1933/01/15, 80 y.o.   MRN: [id number] Patient ID: Barry [name], male   DOB: 1933-03-02, 80 y.o.   MRN: [id number] [facility] [id number] Progress Note JASEAN PABIAN MRN: [id number] DOB: [birth year] Age: 80 y.o.  Date: 02/28/2014 Start Time: 10:15 AM End Time: 10:30 AM  Chief Complaint: Chief Complaint  Patient presents with  . Anxiety  . Depression  . Follow-up   Subjective: "I've been very nervous ".  This patient is a 80 year old widowed white male who lives alone in [place]. He has 2 children and 3 grandchildren who live in [place]. 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  The patient states that he developed anxiety and depression in his 46s. His depression worsened when his wife died suddenly 55 years ago. However he is doing very well on his current medications. He denies being depressed or anxious and he is sleeping quite well. He spends most of his time with her grandchildren and really enjoys it. His social life is quite active. He has no side effects or complaints regarding his medications  The patient returns after one month. He's very nervous and jittery. He went to the emergency room last week because he felt dizzy. He states that the Valium is no longer working for many feels anxious all day [name]. He is trying to continue to mow lawns but it's been difficult. While he was in the emergency room he was given Ativan which really helped. He took it years ago but this stopped working as well. He thinks at this point he needs to switch back and I'm in agreement. He denies any suicidal ideation or psychotic symptoms  Current psychiatric medication Valium 2 mg twice a day Nortriptyline 10 mg daily Prozac 20 mg daily which is  prescribed by primary care physician  Vitals: BP 160/88  Ht 5\' 7"  (1.702 m)  Wt 197 lb (89.359 kg)  BMI 30.85 kg/m2  Allergies: Allergies  Allergen Reactions  . Lisinopril-Hydrochlorothiazide Swelling    ZESTRIL  . Doxycycline Rash   Medical History: Past Medical History  Diagnosis Date  . GERD (gastroesophageal reflux disease)   . Depression   . Tobacco abuse   . Hyperlipidemia   . Diabetes mellitus   . Allergy     rhinitis  . Hepatitis   . Hypertension   . PVD (peripheral vascular disease)   . AAA (abdominal aortic aneurysm) 3-09    4.7 cm  . Diverticulosis   . Personal history of colonic polyps     adenomas, serrated also  . Nephrolithiasis   Patient has history of diabetes mellitus, hypertension, peripheral vascular disease, abdominal aortic aneurysm, diverticulosis, GERD and hyperlipidemia.  He sees Dr. Timoteo Gaul regularly and will be going back soon for blood work.   Surgical History: Past Surgical History  Procedure Laterality Date  . Surgical excision basal cell carcinoma    . Lower limb amputation, other toe 5th    . Tympanic eardrum repair    . Percutaneous stent graft repair of infrarenal aaa  11/10    5.6 cm (T. early)  . Colonoscopy  2006, 2008, 2010, 05/27/2011    numerous adenomas - 13 in 2006, 3, 2008, 2 2012 (up to 1 cm), 4  diminutive adenomas, serrated adenomas 2012. Diverticulosis and hemorrhoids also.  . Eye surgery    . Kidney stone operation     Family History: family history includes Cancer in his brother; Depression in his sister; Emphysema in his father; Kidney disease in his sister; Lung disease in his sister. There is no history of Colon cancer, Dementia, Alcohol abuse, Drug abuse, Paranoid behavior, Schizophrenia, Anxiety disorder, Bipolar disorder, OCD, Sexual abuse, Physical abuse, or Seizures. Reviewed and the history of his sisters was clarified and the passing of [name] recorded.  Mental status examination Patient is well groomed well  dressed.  He is pleasant, but  anxious.  He maintained a good eye contact. His speech is soft clear and coherent. His thought process is logical linear and goal-directed. He denies any active or passive suicidal thoughts or homicidal thoughts.  He denies any auditory or visual hallucination.  He described his mood as nervous and shaky and his affect is mood congruent.  There no psychotic symptoms present. He's alert and oriented x3. His insight judgment and impulse control is okay.  Lab Results:  Results for orders placed during the hospital encounter of 02/22/14 (from the past 8736 hour(s))  CBG MONITORING, ED   Collection Time    02/22/14  3:56 PM      Result Value Ref Range   Glucose-Capillary 127 (*) 70 - 99 mg/dL  CBC WITH DIFFERENTIAL   Collection Time    02/22/14  4:00 PM      Result Value Ref Range   WBC 6.1  4.0 - 10.5 K/uL   RBC 4.63  4.22 - 5.81 MIL/uL   Hemoglobin 15.2  13.0 - 17.0 g/dL   HCT 44.1  39.0 - 52.0 %   MCV 95.2  78.0 - 100.0 fL   MCH 32.8  26.0 - 34.0 pg   MCHC 34.5  30.0 - 36.0 g/dL   RDW 14.5  11.5 - 15.5 %   Platelets 221  150 - 400 K/uL   Neutrophils Relative % 51  43 - 77 %   Neutro Abs 3.1  1.7 - 7.7 K/uL   Lymphocytes Relative 36  12 - 46 %   Lymphs Abs 2.2  0.7 - 4.0 K/uL   Monocytes Relative 11  3 - 12 %   Monocytes Absolute 0.7  0.1 - 1.0 K/uL   Eosinophils Relative 2  0 - 5 %   Eosinophils Absolute 0.1  0.0 - 0.7 K/uL   Basophils Relative 0  0 - 1 %   Basophils Absolute 0.0  0.0 - 0.1 K/uL  BASIC METABOLIC PANEL   Collection Time    02/22/14  4:00 PM      Result Value Ref Range   Sodium 139  137 - 147 mEq/L   Potassium 3.7  3.7 - 5.3 mEq/L   Chloride 101  96 - 112 mEq/L   CO2 27  19 - 32 mEq/L   Glucose, Bld 141 (*) 70 - 99 mg/dL   BUN 27 (*) 6 - 23 mg/dL   Creatinine, Ser 1.05  0.50 - 1.35 mg/dL   Calcium 9.3  8.4 - 10.5 mg/dL   GFR calc non Af Amer 66 (*) >90 mL/min   GFR calc Af Amer 76 (*) >90 mL/min  TROPONIN I   Collection Time     02/22/14  4:00 PM      Result Value Ref Range   Troponin I <0.30  <0.30 ng/mL  Results for orders placed during  the hospital encounter of 11/15/13 (from the past 8736 hour(s))  CBC WITH DIFFERENTIAL   Collection Time    11/15/13  4:34 PM      Result Value Ref Range   WBC 6.3  4.0 - 10.5 K/uL   RBC 5.13  4.22 - 5.81 MIL/uL   Hemoglobin 16.8  13.0 - 17.0 g/dL   HCT 48.6  39.0 - 52.0 %   MCV 94.7  78.0 - 100.0 fL   MCH 32.7  26.0 - 34.0 pg   MCHC 34.6  30.0 - 36.0 g/dL   RDW 14.0  11.5 - 15.5 %   Platelets 208  150 - 400 K/uL   Neutrophils Relative % 45  43 - 77 %   Neutro Abs 2.8  1.7 - 7.7 K/uL   Lymphocytes Relative 37  12 - 46 %   Lymphs Abs 2.3  0.7 - 4.0 K/uL   Monocytes Relative 12  3 - 12 %   Monocytes Absolute 0.8  0.1 - 1.0 K/uL   Eosinophils Relative 5  0 - 5 %   Eosinophils Absolute 0.3  0.0 - 0.7 K/uL   Basophils Relative 1  0 - 1 %   Basophils Absolute 0.1  0.0 - 0.1 K/uL  COMPREHENSIVE METABOLIC PANEL   Collection Time    11/15/13  4:34 PM      Result Value Ref Range   Sodium 136 (*) 137 - 147 mEq/L   Potassium 4.0  3.7 - 5.3 mEq/L   Chloride 95 (*) 96 - 112 mEq/L   CO2 30  19 - 32 mEq/L   Glucose, Bld 160 (*) 70 - 99 mg/dL   BUN 23  6 - 23 mg/dL   Creatinine, Ser 1.21  0.50 - 1.35 mg/dL   Calcium 9.9  8.4 - 10.5 mg/dL   Total Protein 6.9  6.0 - 8.3 g/dL   Albumin 3.5  3.5 - 5.2 g/dL   AST 18  0 - 37 U/L   ALT 16  0 - 53 U/L   Alkaline Phosphatase 46  39 - 117 U/L   Total Bilirubin 0.2 (*) 0.3 - 1.2 mg/dL   GFR calc non Af Amer 56 (*) >90 mL/min   GFR calc Af Amer 64 (*) >90 mL/min  TROPONIN I   Collection Time    11/15/13  4:34 PM      Result Value Ref Range   Troponin I <0.30  <0.30 ng/mL  Results for orders placed in visit on 07/10/13 (from the past 8736 hour(s))  COMPREHENSIVE METABOLIC PANEL   Collection Time    07/10/13 12:13 PM      Result Value Ref Range   Sodium 137  135 - 145 mEq/L   Potassium 4.2  3.5 - 5.1 mEq/L   Chloride 100  96  - 112 mEq/L   CO2 30  19 - 32 mEq/L   Glucose, Bld 184 (*) 70 - 99 mg/dL   BUN 19  6 - 23 mg/dL   Creatinine, Ser 1.0  0.4 - 1.5 mg/dL   Total Bilirubin 0.4  0.3 - 1.2 mg/dL   Alkaline Phosphatase 47  39 - 117 U/L   AST 20  0 - 37 U/L   ALT 16  0 - 53 U/L   Total Protein 6.9  6.0 - 8.3 g/dL   Albumin 3.7  3.5 - 5.2 g/dL   Calcium 9.8  8.4 - 10.5 mg/dL   GFR 76.82  >60.00 mL/min  HEMOGLOBIN A1C   Collection Time    07/10/13 12:13 PM      Result Value Ref Range   Hemoglobin A1C 8.0 (*) 4.6 - 6.5 %  PCP draws routine labs and nothing is emerging as of concern.  Assessment Axis I Depressive disorder NOS Axis II deferred Axis III see medical history Axis IV mild to moderate Axis V 65-70  Plan/Discussion: I took his vitals.  I reviewed CC, tobacco/med/surg Hx, meds effects/ side effects, problem list, therapies and responses as well as current situation/symptoms discussed options. Continue current effective medications but discontinue Valium and start Ativan 0.5-1 mg 3 times a day He'll return in four-weeks See orders and pt instructions for more details.  MEDICATIONS this encounter: Meds ordered this encounter  Medications  . FLUoxetine (PROZAC) 20 MG capsule    Sig: Take 1 capsule (20 mg total) by mouth every morning.    Dispense:  90 capsule    Refill:  3  . LORazepam (ATIVAN) 1 MG tablet    Sig: Take 1 tablet (1 mg total) by mouth 3 (three) times daily.    Dispense:  90 tablet    Refill:  2    Medical Decision Making Problem Points:  Established problem, stable/improving (1), Review of last therapy session (1) and Review of psycho-social stressors (1) Data Points:  Review or order clinical lab tests (1) Review of medication regiment & side effects (2)  I certify that outpatient services furnished can reasonably be expected to improve the patient's condition.   [name], MD

## 2014-03-07 ENCOUNTER — Encounter: Payer: Self-pay | Admitting: *Deleted

## 2014-03-15 DIAGNOSIS — S20219A Contusion of unspecified front wall of thorax, initial encounter: Secondary | ICD-10-CM | POA: Diagnosis not present

## 2014-03-29 ENCOUNTER — Other Ambulatory Visit: Payer: Self-pay | Admitting: *Deleted

## 2014-03-29 MED ORDER — PIOGLITAZONE HCL 30 MG PO TABS: 30.0000 mg | ORAL_TABLET | Freq: Every day | ORAL | Status: DC

## 2014-03-29 NOTE — Telephone Encounter (Signed)
Received fax requesting refill on medication.   Patient has not been seen to establish care at this time, but appointment has been scheduled for 04/10/2014.  30 day supply sent in.

## 2014-04-02 ENCOUNTER — Telehealth (HOSPITAL_COMMUNITY): Payer: Self-pay | Admitting: *Deleted

## 2014-04-02 NOTE — Telephone Encounter (Signed)
done

## 2014-04-04 ENCOUNTER — Telehealth (HOSPITAL_COMMUNITY): Payer: Self-pay | Admitting: *Deleted

## 2014-04-08 DIAGNOSIS — Z85828 Personal history of other malignant neoplasm of skin: Secondary | ICD-10-CM | POA: Diagnosis not present

## 2014-04-08 DIAGNOSIS — L570 Actinic keratosis: Secondary | ICD-10-CM | POA: Diagnosis not present

## 2014-04-08 DIAGNOSIS — C44621 Squamous cell carcinoma of skin of unspecified upper limb, including shoulder: Secondary | ICD-10-CM | POA: Diagnosis not present

## 2014-04-08 DIAGNOSIS — L723 Sebaceous cyst: Secondary | ICD-10-CM | POA: Diagnosis not present

## 2014-04-10 ENCOUNTER — Ambulatory Visit (INDEPENDENT_AMBULATORY_CARE_PROVIDER_SITE_OTHER): Payer: Medicare Other | Admitting: Family Medicine

## 2014-04-10 ENCOUNTER — Encounter: Payer: Self-pay | Admitting: Family Medicine

## 2014-04-10 VITALS — BP 148/76 | HR 82 | Temp 97.6°F | Resp 16 | Ht 68.5 in | Wt 203.0 lb

## 2014-04-10 DIAGNOSIS — I714 Abdominal aortic aneurysm, without rupture, unspecified: Secondary | ICD-10-CM

## 2014-04-10 DIAGNOSIS — E785 Hyperlipidemia, unspecified: Secondary | ICD-10-CM

## 2014-04-10 DIAGNOSIS — I2510 Atherosclerotic heart disease of native coronary artery without angina pectoris: Secondary | ICD-10-CM

## 2014-04-10 DIAGNOSIS — R42 Dizziness and giddiness: Secondary | ICD-10-CM | POA: Diagnosis not present

## 2014-04-10 DIAGNOSIS — E119 Type 2 diabetes mellitus without complications: Secondary | ICD-10-CM

## 2014-04-10 DIAGNOSIS — I10 Essential (primary) hypertension: Secondary | ICD-10-CM

## 2014-04-10 DIAGNOSIS — F17200 Nicotine dependence, unspecified, uncomplicated: Secondary | ICD-10-CM

## 2014-04-10 MED ORDER — HYDROCHLOROTHIAZIDE 25 MG PO TABS: 25.0000 mg | ORAL_TABLET | Freq: Every day | ORAL | Status: DC

## 2014-04-10 MED ORDER — RAMIPRIL 10 MG PO CAPS: 10.0000 mg | ORAL_CAPSULE | Freq: Every day | ORAL | Status: DC

## 2014-04-10 MED ORDER — GLIMEPIRIDE 1 MG PO TABS: 1.0000 mg | ORAL_TABLET | Freq: Two times a day (BID) | ORAL | Status: DC

## 2014-04-10 MED ORDER — ATENOLOL 25 MG PO TABS: 12.5000 mg | ORAL_TABLET | Freq: Every day | ORAL | Status: DC

## 2014-04-10 MED ORDER — NIFEDIPINE ER OSMOTIC RELEASE 30 MG PO TB24: 30.0000 mg | ORAL_TABLET | Freq: Every day | ORAL | Status: DC

## 2014-04-10 MED ORDER — PIOGLITAZONE HCL 30 MG PO TABS: 30.0000 mg | ORAL_TABLET | Freq: Every day | ORAL | Status: DC

## 2014-04-10 MED ORDER — EZETIMIBE-SIMVASTATIN 10-40 MG PO TABS: 1.0000 | ORAL_TABLET | Freq: Every day | ORAL | Status: DC

## 2014-04-10 NOTE — Assessment & Plan Note (Signed)
We'll recheck his fasting labs and then adjust his medications. I will also send his test strips I want him to check his blood sugar twice a day

## 2014-04-10 NOTE — Progress Notes (Signed)
Patient ID: Barry [name], male   DOB: 1932/12/29, 80 y.o.   MRN: [id number]   Subjective:    Patient ID: Barry [name], male    DOB: Dec 30, 1932, 80 y.o.   MRN: [id number]  Patient presents for New Patient CPE  patient here to establish care. Previous PCP was Dr. Crissie Sickles. His cardiologist is Dr. Kirk Ruths. His vascular surgeon is Dr. early who follows his abdominal aortic aneurysm Medications and history reviewed His history of diabetes mellitus hypertension hyperlipidemia he also has a history of dysthymic disorder which is long-standing since his 42s. He is also followed by psychiatry Dr. Harrington Challenger. Diabetes mellitus last A1c was 8% this was done in September 2014 he is due for fasting labs he is currently on actos and glimepiride. He occasionally has some hypoglycemia especially if he does not eat enough. Hypertension he's been maintained on his current medications for some time he has not had any difficulty. His chart does show an allergy to Zestril however he has been on ramipril for some time now without any difficulties. Hyperlipidemia he takes one half tablet of the Vytorin he started doing this secondary to cost, he has not had a repeat lipid panel since then.  His immunizations are up to date. His colonoscopy is up to date he gets this every 3 years    Review Of Systems:  GEN- denies fatigue, fever, weight loss,weakness, recent illness HEENT- denies eye drainage, change in vision, nasal discharge, CVS- denies chest pain, palpitations RESP- denies SOB, cough, wheeze ABD- denies N/V, change in stools, abd pain GU- denies dysuria, hematuria, dribbling, incontinence MSK- denies joint pain, muscle aches, injury Neuro- denies headache, dizziness, syncope, seizure activity       Objective:    BP 148/76  Pulse 82  Temp(Src) 97.6 F (36.4 C) (Oral)  Resp 16  Ht 5' 8.5" (1.74 m)  Wt 203 lb (92.08 kg)  BMI 30.41 kg/m2 GEN- NAD, alert and oriented x3 HEENT- PERRL, EOMI,  non injected sclera, pink conjunctiva, MMM, oropharynx clear Neck- Supple,  CVS- RRR, no murmur RESP-CTAB ABD-NABS,soft,NT,ND EXT- No edema, amputation right 5th digit Psych- normal affect and mood, very pleasant Pulses- Radial, DP- 2+        Assessment & Plan:      Problem List Items Addressed This Visit   None      Note: This dictation was prepared with Dragon dictation along with smaller phrase technology. Any transcriptional errors that result from this process are unintentional.

## 2014-04-10 NOTE — Patient Instructions (Signed)
Continue current medications Referral to physical therapy for the vertigo Return for your labs this week  Check your sugar twice a day  F/U 3 months

## 2014-04-10 NOTE — Assessment & Plan Note (Signed)
Followed by vascular surgery. 

## 2014-04-10 NOTE — Assessment & Plan Note (Signed)
Blood pressure looks good for his age we will continue current medications

## 2014-04-10 NOTE — Assessment & Plan Note (Signed)
Review last cardiology note will also check his lipid panel he is on aspirin as well as beta blocker and statin drug

## 2014-04-10 NOTE — Assessment & Plan Note (Signed)
Fasting lipid panel

## 2014-04-10 NOTE — Assessment & Plan Note (Signed)
He has chronic vertigo and dizzy spells. He would like to see ear nose and throat are possibly have vestibular rehabilitation his son recently went through this and did well with it. He will call me back with the name of the provider he wants to see

## 2014-04-12 ENCOUNTER — Other Ambulatory Visit: Payer: Medicare Other

## 2014-04-12 DIAGNOSIS — E119 Type 2 diabetes mellitus without complications: Secondary | ICD-10-CM | POA: Diagnosis not present

## 2014-04-12 DIAGNOSIS — I10 Essential (primary) hypertension: Secondary | ICD-10-CM | POA: Diagnosis not present

## 2014-04-12 DIAGNOSIS — Z0000 Encounter for general adult medical examination without abnormal findings: Secondary | ICD-10-CM | POA: Diagnosis not present

## 2014-04-12 DIAGNOSIS — E785 Hyperlipidemia, unspecified: Secondary | ICD-10-CM | POA: Diagnosis not present

## 2014-04-12 LAB — COMPLETE METABOLIC PANEL WITH GFR
ALT: 13 U/L (ref 0–53)
AST: 17 U/L (ref 0–37)
Albumin: 4.1 g/dL (ref 3.5–5.2)
Alkaline Phosphatase: 36 U/L — ABNORMAL LOW (ref 39–117)
BUN: 24 mg/dL — ABNORMAL HIGH (ref 6–23)
CO2: 28 meq/L (ref 19–32)
Calcium: 9 mg/dL (ref 8.4–10.5)
Chloride: 100 mEq/L (ref 96–112)
Creat: 1.05 mg/dL (ref 0.50–1.35)
GFR, EST AFRICAN AMERICAN: 78 mL/min
GFR, EST NON AFRICAN AMERICAN: 68 mL/min
GLUCOSE: 141 mg/dL — AB (ref 70–99)
Potassium: 4.2 mEq/L (ref 3.5–5.3)
Sodium: 139 mEq/L (ref 135–145)
TOTAL PROTEIN: 6.5 g/dL (ref 6.0–8.3)
Total Bilirubin: 0.3 mg/dL (ref 0.2–1.2)

## 2014-04-12 LAB — LIPID PANEL
CHOL/HDL RATIO: 2.8 ratio
Cholesterol: 136 mg/dL (ref 0–200)
HDL: 48 mg/dL (ref >39)
LDL Cholesterol: 67 mg/dL (ref 0–99)
TRIGLYCERIDES: 104 mg/dL (ref <150)
VLDL: 21 mg/dL (ref 0–40)

## 2014-04-12 LAB — CBC WITH DIFFERENTIAL/PLATELET
Basophils Absolute: 0.1 10*3/uL (ref 0.0–0.1)
Basophils Relative: 1 % (ref 0–1)
Eosinophils Absolute: 0.2 10*3/uL (ref 0.0–0.7)
Eosinophils Relative: 3 % (ref 0–5)
HEMATOCRIT: 45.7 % (ref 39.0–52.0)
Hemoglobin: 15.3 g/dL (ref 13.0–17.0)
LYMPHS PCT: 41 % (ref 12–46)
Lymphs Abs: 2.2 10*3/uL (ref 0.7–4.0)
MCH: 32.1 pg (ref 26.0–34.0)
MCHC: 33.5 g/dL (ref 30.0–36.0)
MCV: 95.8 fL (ref 78.0–100.0)
MONOS PCT: 11 % (ref 3–12)
Monocytes Absolute: 0.6 10*3/uL (ref 0.1–1.0)
NEUTROS ABS: 2.3 10*3/uL (ref 1.7–7.7)
NEUTROS PCT: 44 % (ref 43–77)
Platelets: 233 10*3/uL (ref 150–400)
RBC: 4.77 MIL/uL (ref 4.22–5.81)
RDW: 14.3 % (ref 11.5–15.5)
WBC: 5.3 10*3/uL (ref 4.0–10.5)

## 2014-04-12 LAB — HEMOGLOBIN A1C
HEMOGLOBIN A1C: 6.8 % — AB (ref <5.7)
MEAN PLASMA GLUCOSE: 148 mg/dL — AB (ref <117)

## 2014-04-12 NOTE — Addendum Note (Signed)
Addended by: Vic Blackbird F on: 04/12/2014 05:58 PM   Modules accepted: Orders

## 2014-04-15 ENCOUNTER — Encounter: Payer: Self-pay | Admitting: *Deleted

## 2014-04-23 ENCOUNTER — Telehealth: Payer: Self-pay | Admitting: *Deleted

## 2014-04-23 NOTE — Telephone Encounter (Signed)
Call placed to patient. LMTRC.  

## 2014-04-23 NOTE — Telephone Encounter (Signed)
Message copied by Sheral Flow on Tue Apr 23, 2014  2:28 PM ------      Message from: Devoria Glassing      Created: Tue Apr 23, 2014 10:25 AM       Patient would like to speak with you about his test strips and about how the pioglitazone is making him drained       [phone] or [phone] ------

## 2014-04-24 NOTE — Telephone Encounter (Signed)
Patient returned call.   States that he cannot take the Actos; it is sapping his strength.   Also states that about 15 minutes after taking medication he was having headache and felt weak. Reports that FSBS had been noted below 80 while taking medication.   States that he stopped medication and he feels better. FSBS in the mornings have been 100- 120.   MD made aware and agreed to stop medication.

## 2014-04-26 ENCOUNTER — Encounter (HOSPITAL_COMMUNITY): Payer: Self-pay | Admitting: Psychiatry

## 2014-04-26 ENCOUNTER — Ambulatory Visit (INDEPENDENT_AMBULATORY_CARE_PROVIDER_SITE_OTHER): Payer: Medicare Other | Admitting: Psychiatry

## 2014-04-26 VITALS — BP 130/70 | Ht 68.0 in | Wt 202.0 lb

## 2014-04-26 DIAGNOSIS — F5105 Insomnia due to other mental disorder: Secondary | ICD-10-CM

## 2014-04-26 DIAGNOSIS — I2510 Atherosclerotic heart disease of native coronary artery without angina pectoris: Secondary | ICD-10-CM

## 2014-04-26 DIAGNOSIS — F329 Major depressive disorder, single episode, unspecified: Secondary | ICD-10-CM

## 2014-04-26 DIAGNOSIS — F489 Nonpsychotic mental disorder, unspecified: Secondary | ICD-10-CM | POA: Diagnosis not present

## 2014-04-26 DIAGNOSIS — F341 Dysthymic disorder: Secondary | ICD-10-CM | POA: Diagnosis not present

## 2014-04-26 DIAGNOSIS — F3289 Other specified depressive episodes: Secondary | ICD-10-CM

## 2014-04-26 DIAGNOSIS — F418 Other specified anxiety disorders: Secondary | ICD-10-CM

## 2014-04-26 MED ORDER — NORTRIPTYLINE HCL 10 MG PO CAPS: 10.0000 mg | ORAL_CAPSULE | Freq: Every day | ORAL | Status: DC

## 2014-04-26 MED ORDER — LORAZEPAM 1 MG PO TABS
1.0000 mg | ORAL_TABLET | Freq: Three times a day (TID) | ORAL | Status: DC
Start: 2014-04-26 — End: 2014-07-26

## 2014-04-26 NOTE — Progress Notes (Signed)
Patient ID: RAHKIM RABALAIS, male   DOB: 01/21/1933, 80 y.o.   MRN: [id number] Patient ID: CLAYDEN WITHEM, male   DOB: 07-Aug-1933, 80 y.o.   MRN: [id number] Patient ID: BRICK KETCHER, male   DOB: 1933-01-27, 80 y.o.   MRN: [id number] Patient ID: ALEKAI POCOCK, male   DOB: [birth year], 80 y.o.   MRN: [id number] Patient ID: MOUHAMAD TEED, male   DOB: 13-Jan-1933, 80 y.o.   MRN: [id number] [facility] [id number] Progress Note TRINTEN BOUDOIN MRN: [id number] DOB: 02-Dec-1932 Age: 80 y.o.  Date: 04/26/2014 Start Time: 10:15 AM End Time: 10:30 AM  Chief Complaint: Chief Complaint  Patient presents with  . Anxiety  . Depression  . Follow-up   Subjective: I'm feeling better ".  This patient is a 80 year old widowed white male who lives alone in [place]. He has 2 children and 3 grandchildren who live in [place]. 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  The patient states that he developed anxiety and depression in his 54s. His depression worsened when his wife died suddenly 60 years ago. However he is doing very well on his current medications. He denies being depressed or anxious and he is sleeping quite well. He spends most of his time with her grandchildren and really enjoys it. His social life is quite active. He has no side effects or complaints regarding his medications  The patient returns after one month. Last time we changed his Valium to Ativan and is starting to feel much better in terms of anxiety. He was tired all the time and he found out it was from the Actos he was taking for his diabetes. He missed a dose for a couple days and realized is feeling much better. He spoke to his primary doctor and she agreed that he could stop it and his energy has returned. His blood sugar is under good control as his cholesterol. He is back doing things with his grandchildren and doing his yard work  Current psychiatric medication Ativan 1 mg  3 times a day as needed Nortriptyline 10 mg daily Prozac 20 mg daily which is prescribed by primary care physician  Vitals: BP 130/70  Ht _0  (1.727 m)  Wt 202 lb (91.627 kg)  BMI 30.72 kg/m2  Allergies: Allergies  Allergen Reactions  . Lisinopril-Hydrochlorothiazide Swelling    ZESTRIL  . Doxycycline Rash   Medical History: Past Medical History  Diagnosis Date  . GERD (gastroesophageal reflux disease)   . Depression   . Tobacco abuse   . Hyperlipidemia   . Diabetes mellitus   . Allergy     rhinitis  . Hepatitis   . Hypertension   . PVD (peripheral vascular disease)   . AAA (abdominal aortic aneurysm) 3-09    4.7 cm  . Diverticulosis   . Personal history of colonic polyps     adenomas, serrated also  . Nephrolithiasis   . Anxiety   . Cancer   . Cataract   Patient has history of diabetes mellitus, hypertension, peripheral vascular disease, abdominal aortic aneurysm, diverticulosis, GERD and hyperlipidemia.  He sees Dr. Timoteo Gaul regularly and will be going back soon for blood work.   Surgical History: Past Surgical History  Procedure Laterality Date  . Surgical excision basal cell carcinoma    . Lower limb amputation, other toe 5th    . Tympanic eardrum repair    . Percutaneous stent graft repair of infrarenal aaa  11/10    5.6 cm (T. early)  . Colonoscopy  2006, 2008, 2010, 05/27/2011    numerous adenomas - 13 in 2006, 3, 2008, 2 2012 (up to 1 cm), 4 diminutive adenomas, serrated adenomas 2012. Diverticulosis and hemorrhoids also.  . Eye surgery    . Kidney stone operation    . Vasectomy     Family History: family history includes Cancer in his brother and brother; Depression in his brother and sister; Diabetes in his sister; Emphysema in his father; Heart disease in his mother; Kidney disease in his sister; Learning disabilities in his sister; Lung disease in his sister; Mental retardation in his sister. There is no history of Colon cancer, Dementia, Alcohol  abuse, Drug abuse, Paranoid behavior, Schizophrenia, Anxiety disorder, Bipolar disorder, OCD, Sexual abuse, Physical abuse, or Seizures. Reviewed and the history of his sisters was clarified and the passing of [name] recorded.  Mental status examination Patient is well groomed well dressed.  He is pleasant, but  anxious.  He maintained a good eye contact. His speech is soft clear and coherent. His thought process is logical linear and goal-directed. He denies any active or passive suicidal thoughts or homicidal thoughts.  He denies any auditory or visual hallucination.  He described his mood as good and his affect is mood congruent.  There no psychotic symptoms present. He's alert and oriented x3. His insight judgment and impulse control is okay.  Lab Results:  Results for orders placed in visit on 04/12/14 (from the past 8736 hour(s))  CBC WITH DIFFERENTIAL   Collection Time    04/12/14  8:10 AM      Result Value Ref Range   WBC 5.3  4.0 - 10.5 K/uL   RBC 4.77  4.22 - 5.81 MIL/uL   Hemoglobin 15.3  13.0 - 17.0 g/dL   HCT 45.7  39.0 - 52.0 %   MCV 95.8  78.0 - 100.0 fL   MCH 32.1  26.0 - 34.0 pg   MCHC 33.5  30.0 - 36.0 g/dL   RDW 14.3  11.5 - 15.5 %   Platelets 233  150 - 400 K/uL   Neutrophils Relative % 44  43 - 77 %   Neutro Abs 2.3  1.7 - 7.7 K/uL   Lymphocytes Relative 41  12 - 46 %   Lymphs Abs 2.2  0.7 - 4.0 K/uL   Monocytes Relative 11  3 - 12 %   Monocytes Absolute 0.6  0.1 - 1.0 K/uL   Eosinophils Relative 3  0 - 5 %   Eosinophils Absolute 0.2  0.0 - 0.7 K/uL   Basophils Relative 1  0 - 1 %   Basophils Absolute 0.1  0.0 - 0.1 K/uL   Smear Review Criteria for review not met    HEMOGLOBIN A1C   Collection Time    04/12/14  8:10 AM      Result Value Ref Range   Hemoglobin A1C 6.8 (*) <5.7 %   Mean Plasma Glucose 148 (*) <117 mg/dL  LIPID PANEL   Collection Time    04/12/14  8:10 AM      Result Value Ref Range   Cholesterol 136  0 - 200 mg/dL   Triglycerides 104  <150  mg/dL   HDL 48  >39 mg/dL   Total CHOL/HDL Ratio 2.8     VLDL 21  0 - 40 mg/dL   LDL Cholesterol 67  0 - 99 mg/dL  COMPLETE METABOLIC PANEL WITH GFR  Collection Time    04/12/14  8:10 AM      Result Value Ref Range   Sodium 139  135 - 145 mEq/L   Potassium 4.2  3.5 - 5.3 mEq/L   Chloride 100  96 - 112 mEq/L   CO2 28  19 - 32 mEq/L   Glucose, Bld 141 (*) 70 - 99 mg/dL   BUN 24 (*) 6 - 23 mg/dL   Creat 1.05  0.50 - 1.35 mg/dL   Total Bilirubin 0.3  0.2 - 1.2 mg/dL   Alkaline Phosphatase 36 (*) 39 - 117 U/L   AST 17  0 - 37 U/L   ALT 13  0 - 53 U/L   Total Protein 6.5  6.0 - 8.3 g/dL   Albumin 4.1  3.5 - 5.2 g/dL   Calcium 9.0  8.4 - 10.5 mg/dL   GFR, Est African American 78     GFR, Est Non African American 68    Results for orders placed during the hospital encounter of 02/22/14 (from the past 8736 hour(s))  CBG MONITORING, ED   Collection Time    02/22/14  3:56 PM      Result Value Ref Range   Glucose-Capillary 127 (*) 70 - 99 mg/dL  CBC WITH DIFFERENTIAL   Collection Time    02/22/14  4:00 PM      Result Value Ref Range   WBC 6.1  4.0 - 10.5 K/uL   RBC 4.63  4.22 - 5.81 MIL/uL   Hemoglobin 15.2  13.0 - 17.0 g/dL   HCT 44.1  39.0 - 52.0 %   MCV 95.2  78.0 - 100.0 fL   MCH 32.8  26.0 - 34.0 pg   MCHC 34.5  30.0 - 36.0 g/dL   RDW 14.5  11.5 - 15.5 %   Platelets 221  150 - 400 K/uL   Neutrophils Relative % 51  43 - 77 %   Neutro Abs 3.1  1.7 - 7.7 K/uL   Lymphocytes Relative 36  12 - 46 %   Lymphs Abs 2.2  0.7 - 4.0 K/uL   Monocytes Relative 11  3 - 12 %   Monocytes Absolute 0.7  0.1 - 1.0 K/uL   Eosinophils Relative 2  0 - 5 %   Eosinophils Absolute 0.1  0.0 - 0.7 K/uL   Basophils Relative 0  0 - 1 %   Basophils Absolute 0.0  0.0 - 0.1 K/uL  BASIC METABOLIC PANEL   Collection Time    02/22/14  4:00 PM      Result Value Ref Range   Sodium 139  137 - 147 mEq/L   Potassium 3.7  3.7 - 5.3 mEq/L   Chloride 101  96 - 112 mEq/L   CO2 27  19 - 32 mEq/L   Glucose,  Bld 141 (*) 70 - 99 mg/dL   BUN 27 (*) 6 - 23 mg/dL   Creatinine, Ser 1.05  0.50 - 1.35 mg/dL   Calcium 9.3  8.4 - 10.5 mg/dL   GFR calc non Af Amer 66 (*) >90 mL/min   GFR calc Af Amer 76 (*) >90 mL/min  TROPONIN I   Collection Time    02/22/14  4:00 PM      Result Value Ref Range   Troponin I <0.30  <0.30 ng/mL  Results for orders placed during the hospital encounter of 11/15/13 (from the past 8736 hour(s))  CBC WITH DIFFERENTIAL   Collection Time  11/15/13  4:34 PM      Result Value Ref Range   WBC 6.3  4.0 - 10.5 K/uL   RBC 5.13  4.22 - 5.81 MIL/uL   Hemoglobin 16.8  13.0 - 17.0 g/dL   HCT 48.6  39.0 - 52.0 %   MCV 94.7  78.0 - 100.0 fL   MCH 32.7  26.0 - 34.0 pg   MCHC 34.6  30.0 - 36.0 g/dL   RDW 14.0  11.5 - 15.5 %   Platelets 208  150 - 400 K/uL   Neutrophils Relative % 45  43 - 77 %   Neutro Abs 2.8  1.7 - 7.7 K/uL   Lymphocytes Relative 37  12 - 46 %   Lymphs Abs 2.3  0.7 - 4.0 K/uL   Monocytes Relative 12  3 - 12 %   Monocytes Absolute 0.8  0.1 - 1.0 K/uL   Eosinophils Relative 5  0 - 5 %   Eosinophils Absolute 0.3  0.0 - 0.7 K/uL   Basophils Relative 1  0 - 1 %   Basophils Absolute 0.1  0.0 - 0.1 K/uL  COMPREHENSIVE METABOLIC PANEL   Collection Time    11/15/13  4:34 PM      Result Value Ref Range   Sodium 136 (*) 137 - 147 mEq/L   Potassium 4.0  3.7 - 5.3 mEq/L   Chloride 95 (*) 96 - 112 mEq/L   CO2 30  19 - 32 mEq/L   Glucose, Bld 160 (*) 70 - 99 mg/dL   BUN 23  6 - 23 mg/dL   Creatinine, Ser 1.21  0.50 - 1.35 mg/dL   Calcium 9.9  8.4 - 10.5 mg/dL   Total Protein 6.9  6.0 - 8.3 g/dL   Albumin 3.5  3.5 - 5.2 g/dL   AST 18  0 - 37 U/L   ALT 16  0 - 53 U/L   Alkaline Phosphatase 46  39 - 117 U/L   Total Bilirubin 0.2 (*) 0.3 - 1.2 mg/dL   GFR calc non Af Amer 56 (*) >90 mL/min   GFR calc Af Amer 64 (*) >90 mL/min  TROPONIN I   Collection Time    11/15/13  4:34 PM      Result Value Ref Range   Troponin I <0.30  <0.30 ng/mL  Results for orders  placed in visit on 07/10/13 (from the past 8736 hour(s))  COMPREHENSIVE METABOLIC PANEL   Collection Time    07/10/13 12:13 PM      Result Value Ref Range   Sodium 137  135 - 145 mEq/L   Potassium 4.2  3.5 - 5.1 mEq/L   Chloride 100  96 - 112 mEq/L   CO2 30  19 - 32 mEq/L   Glucose, Bld 184 (*) 70 - 99 mg/dL   BUN 19  6 - 23 mg/dL   Creatinine, Ser 1.0  0.4 - 1.5 mg/dL   Total Bilirubin 0.4  0.3 - 1.2 mg/dL   Alkaline Phosphatase 47  39 - 117 U/L   AST 20  0 - 37 U/L   ALT 16  0 - 53 U/L   Total Protein 6.9  6.0 - 8.3 g/dL   Albumin 3.7  3.5 - 5.2 g/dL   Calcium 9.8  8.4 - 10.5 mg/dL   GFR 76.82  >60.00 mL/min  HEMOGLOBIN A1C   Collection Time    07/10/13 12:13 PM      Result Value Ref Range  Hemoglobin A1C 8.0 (*) 4.6 - 6.5 %  PCP draws routine labs and nothing is emerging as of concern.  Assessment Axis I Depressive disorder NOS Axis II deferred Axis III see medical history Axis IV mild to moderate Axis V 65-70  Plan/Discussion: I took his vitals.  I reviewed CC, tobacco/med/surg Hx, meds effects/ side effects, problem list, therapies and responses as well as current situation/symptoms discussed options. Continue current effective medications  He'll return in 3 months See orders and pt instructions for more details.  MEDICATIONS this encounter: Meds ordered this encounter  Medications  . nortriptyline (PAMELOR) 10 MG capsule    Sig: Take 1 capsule (10 mg total) by mouth at bedtime.    Dispense:  90 capsule    Refill:  1  . LORazepam (ATIVAN) 1 MG tablet    Sig: Take 1 tablet (1 mg total) by mouth 3 (three) times daily.    Dispense:  90 tablet    Refill:  2    Medical Decision Making Problem Points:  Established problem, stable/improving (1), Review of last therapy session (1) and Review of psycho-social stressors (1) Data Points:  Review or order clinical lab tests (1) Review of medication regiment & side effects (2)  I certify that outpatient services  furnished can reasonably be expected to improve the patient's condition.   [name], MD

## 2014-05-10 ENCOUNTER — Other Ambulatory Visit: Payer: Self-pay | Admitting: Vascular Surgery

## 2014-05-10 DIAGNOSIS — I714 Abdominal aortic aneurysm, without rupture, unspecified: Secondary | ICD-10-CM | POA: Diagnosis not present

## 2014-05-10 DIAGNOSIS — H43819 Vitreous degeneration, unspecified eye: Secondary | ICD-10-CM | POA: Diagnosis not present

## 2014-05-10 DIAGNOSIS — H35379 Puckering of macula, unspecified eye: Secondary | ICD-10-CM | POA: Diagnosis not present

## 2014-05-10 LAB — BUN: BUN: 24 mg/dL — ABNORMAL HIGH (ref 6–23)

## 2014-05-10 LAB — CREATININE, SERUM: Creat: 1.2 mg/dL (ref 0.50–1.35)

## 2014-05-14 ENCOUNTER — Ambulatory Visit: Payer: Self-pay | Admitting: Vascular Surgery

## 2014-05-14 ENCOUNTER — Ambulatory Visit
Admission: RE | Admit: 2014-05-14 | Discharge: 2014-05-14 | Disposition: A | Discharge status: Home / Self Care | Payer: Medicare Other | Source: Ambulatory Visit | Attending: Vascular Surgery | Admitting: Vascular Surgery

## 2014-05-14 DIAGNOSIS — I714 Abdominal aortic aneurysm, without rupture, unspecified: Secondary | ICD-10-CM

## 2014-05-14 DIAGNOSIS — Z48812 Encounter for surgical aftercare following surgery on the circulatory system: Secondary | ICD-10-CM

## 2014-05-14 MED ORDER — IOHEXOL 350 MG/ML SOLN
80.0000 mL | Freq: Once | INTRAVENOUS | Status: AC | PRN
  Administered 2014-05-14: 80 mL via INTRAVENOUS

## 2014-05-16 DIAGNOSIS — H35379 Puckering of macula, unspecified eye: Secondary | ICD-10-CM | POA: Diagnosis not present

## 2014-05-16 DIAGNOSIS — H3581 Retinal edema: Secondary | ICD-10-CM | POA: Diagnosis not present

## 2014-05-20 ENCOUNTER — Encounter: Payer: Self-pay | Admitting: Vascular Surgery

## 2014-05-20 DIAGNOSIS — H35379 Puckering of macula, unspecified eye: Secondary | ICD-10-CM | POA: Diagnosis not present

## 2014-05-21 ENCOUNTER — Ambulatory Visit (INDEPENDENT_AMBULATORY_CARE_PROVIDER_SITE_OTHER): Payer: Medicare Other | Admitting: Vascular Surgery

## 2014-05-21 ENCOUNTER — Encounter: Payer: Self-pay | Admitting: Vascular Surgery

## 2014-05-21 VITALS — BP 161/90 | HR 79 | Resp 18 | Ht 70.0 in | Wt 201.0 lb

## 2014-05-21 DIAGNOSIS — I714 Abdominal aortic aneurysm, without rupture, unspecified: Secondary | ICD-10-CM

## 2014-05-21 NOTE — Progress Notes (Signed)
Patient name: Barry [name] MRN: [id number] DOB: [birth year] Sex: male     Reason for referral:  Chief Complaint  Patient presents with  . AAA    1 year f/u  CT prior    HISTORY OF PRESENT ILLNESS: Patient presents today for followup of his graft repair of infrarenal abdominal aortic aneurysm. His surgery was November 2010. And she did quite well. He reports that he is quite active with no limitations. He has no new major medical difficulties. A recent eye surgery with no difficulty from this. No symptoms referable to his aneurysm  Past Medical History  Diagnosis Date  . GERD (gastroesophageal reflux disease)   . Depression   . Tobacco abuse   . Hyperlipidemia   . Diabetes mellitus   . Allergy     rhinitis  . Hepatitis   . Hypertension   . PVD (peripheral vascular disease)   . AAA (abdominal aortic aneurysm) 3-09    4.7 cm  . Diverticulosis   . Personal history of colonic polyps     adenomas, serrated also  . Nephrolithiasis   . Anxiety   . Cancer   . Cataract     Past Surgical History  Procedure Laterality Date  . Surgical excision basal cell carcinoma    . Lower limb amputation, other toe 5th    . Tympanic eardrum repair    . Percutaneous stent graft repair of infrarenal aaa  11/10    5.6 cm (T. Matti Killingsworth)  . Colonoscopy  2006, 2008, 2010, 05/27/2011    numerous adenomas - 13 in 2006, 3, 2008, 2 2012 (up to 1 cm), 4 diminutive adenomas, serrated adenomas 2012. Diverticulosis and hemorrhoids also.  . Eye surgery    . Kidney stone operation    . Vasectomy    . Eye surgery Left     History   Social History  . Marital Status: Widowed    Spouse Name: N/A    Number of Children: 2  . Years of Education: N/A   Occupational History  .     Social History Main Topics  . Smoking status: Current Every Day Smoker -- 1.00 packs/day for 60 years    Types: Cigarettes  . Smokeless tobacco: Never Used     Comment: smokes about 16-20 cigs a day as of 04/04/2013    . Alcohol Use: No  . Drug Use: No  . Sexual Activity: Yes    Partners: Female    Patent examiner Protection: Surgical     Comment: vasectomy   Other Topics Concern  . 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 retired 62 years - keeping busy. Lives alone. 1- step-daughter, 1 son - '68; 3 grandchildren. does odd-jobs, yard work    Family History  Problem Relation Age of Onset  . Emphysema Father   . Cancer Brother     Lung  . Depression Brother   . Depression Sister   . Lung disease Sister     Fibrosis and died from pneumonia on 04-03-13  . Colon cancer Neg Hx   . Dementia Neg Hx   . Alcohol abuse Neg Hx   . Drug abuse Neg Hx   . Paranoid behavior Neg Hx   . Schizophrenia Neg Hx   . Anxiety disorder Neg Hx   . Bipolar disorder Neg Hx   . OCD Neg Hx   . Sexual abuse Neg Hx   .  Physical abuse Neg Hx   . Seizures Neg Hx   . Kidney disease Sister   . Diabetes Sister   . Learning disabilities Sister   . Mental retardation Sister   . Heart disease Mother   . Cancer Brother     Allergies as of 05/21/2014 - Review Complete 05/21/2014  Allergen Reaction Noted  . Lisinopril-hydrochlorothiazide Swelling 07/05/2005  . Doxycycline Rash 11/15/2013    Current Outpatient Prescriptions on File Prior to Visit  Medication Sig Dispense Refill  . aspirin EC 81 MG tablet Take 81 mg by mouth every morning.      Marland Kitchen atenolol (TENORMIN) 25 MG tablet Take 0.5 tablets (12.5 mg total) by mouth daily.  45 tablet  2  . ezetimibe-simvastatin (VYTORIN) 10-40 MG per tablet Take 1 tablet by mouth at bedtime.  90 tablet  2  . FLUoxetine (PROZAC) 20 MG capsule Take 1 capsule (20 mg total) by mouth every morning.  90 capsule  3  . glimepiride (AMARYL) 1 MG tablet Take 1 tablet (1 mg total) by mouth 2 (two) times daily.  180 tablet  2  . hydrochlorothiazide (HYDRODIURIL) 25 MG tablet Take 1 tablet (25 mg total) by mouth daily.  90 tablet  3  . LORazepam  (ATIVAN) 1 MG tablet Take 1 tablet (1 mg total) by mouth 3 (three) times daily.  90 tablet  2  . NIFEdipine (NIFEDICAL XL) 30 MG 24 hr tablet Take 1 tablet (30 mg total) by mouth daily.  90 tablet  3  . nortriptyline (PAMELOR) 10 MG capsule Take 1 capsule (10 mg total) by mouth at bedtime.  90 capsule  1  . ramipril (ALTACE) 10 MG capsule Take 1 capsule (10 mg total) by mouth daily.  90 capsule  3   No current facility-administered medications on file prior to visit.       PHYSICAL EXAMINATION:  General: The patient is a well-nourished male, in no acute distress. Vital signs are BP 161/90  Pulse 79  Resp 18  Ht 5\' 10"  (1.778 m)  Wt 201 lb (91.173 kg)  BMI 28.84 kg/m2 Pulmonary: There is a good air exchange Abdomen: Soft and non-tender with normal pitch bowel sounds. No aneurysm palpable Musculoskeletal: There are no major deformities.  There is no significant extremity pain. Neurologic: No focal weakness or paresthesias are detected, Skin: There are no ulcer or rashes noted. Psychiatric: The patient has normal affect. Cardiovascular: 2+ radial 2+ femoral to popliteal and 2+ dorsalis pedis pulses bilaterally with no evidence of peripheral artery aneurysms   CT scan from 05/14/2014 was reviewed and discussed with the patient shows excellent positioning of the stent graft with continued diminished sac size. Initially his aneurysm sac was 5.6 cm. It is now down to 3.8 cm  Impression and Plan:  Stable followup from stent graft repair 5 years ago of the infrarenal abdominal aortic aneurysm. We will see him in 2 years with ultrasound of his stent graft repair resident CT scan. He will notify should he develop any difficulty in the interim    Ciani Rutten [facility] Office: [phone]

## 2014-06-07 ENCOUNTER — Encounter: Payer: Self-pay | Admitting: Internal Medicine

## 2014-06-10 DIAGNOSIS — H35379 Puckering of macula, unspecified eye: Secondary | ICD-10-CM | POA: Diagnosis not present

## 2014-07-01 ENCOUNTER — Encounter: Payer: Self-pay | Admitting: Internal Medicine

## 2014-07-22 DIAGNOSIS — H3581 Retinal edema: Secondary | ICD-10-CM | POA: Diagnosis not present

## 2014-07-26 ENCOUNTER — Encounter (HOSPITAL_COMMUNITY): Payer: Self-pay | Admitting: Psychiatry

## 2014-07-26 ENCOUNTER — Ambulatory Visit (INDEPENDENT_AMBULATORY_CARE_PROVIDER_SITE_OTHER): Payer: Medicare Other | Admitting: Psychiatry

## 2014-07-26 VITALS — BP 160/80 | Ht 70.0 in | Wt 201.0 lb

## 2014-07-26 DIAGNOSIS — F5105 Insomnia due to other mental disorder: Secondary | ICD-10-CM

## 2014-07-26 DIAGNOSIS — F329 Major depressive disorder, single episode, unspecified: Secondary | ICD-10-CM

## 2014-07-26 DIAGNOSIS — F331 Major depressive disorder, recurrent, moderate: Secondary | ICD-10-CM

## 2014-07-26 DIAGNOSIS — F418 Other specified anxiety disorders: Secondary | ICD-10-CM

## 2014-07-26 DIAGNOSIS — F341 Dysthymic disorder: Secondary | ICD-10-CM

## 2014-07-26 MED ORDER — NORTRIPTYLINE HCL 10 MG PO CAPS: 10.0000 mg | ORAL_CAPSULE | Freq: Every day | ORAL | Status: DC

## 2014-07-26 MED ORDER — FLUOXETINE HCL 20 MG PO CAPS: 20.0000 mg | ORAL_CAPSULE | Freq: Every morning | ORAL | Status: DC

## 2014-07-26 MED ORDER — LORAZEPAM 1 MG PO TABS: 1.0000 mg | ORAL_TABLET | Freq: Three times a day (TID) | ORAL | Status: DC

## 2014-07-26 NOTE — Progress Notes (Signed)
Patient ID: Barry [name], male   DOB: 07-Feb-1933, 80 y.o.   MRN: [id number] Patient ID: Barry [name], male   DOB: 1933-07-09, 80 y.o.   MRN: [id number] Patient ID: Barry [name], male   DOB: Jul 26, 1933, 80 y.o.   MRN: [id number] Patient ID: Barry [name], male   DOB: 09/10/1933, 80 y.o.   MRN: [id number] Patient ID: Barry [name], male   DOB: Sep 21, 1933, 80 y.o.   MRN: [id number] Patient ID: Barry [name], male   DOB: [birth year], 80 y.o.   MRN: [id number] [name] [id number] Progress Note Barry [name] MRN: [id number] DOB: Jun 01, 1933 Age: 80 y.o.  Date: 07/26/2014 Start Time: 10:15 AM End Time: 10:30 AM  Chief Complaint: Chief Complaint  Patient presents with  . Anxiety  . Depression  . Follow-up   Subjective: I'm doing well".  This patient is a 80 year-old widowed white male who lives alone in [place]. He has 2 children and 3 grandchildren who live in [place]. 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  The patient states that he developed anxiety and depression in his 3s. His depression worsened when his wife died suddenly 52 years ago. However he is doing very well on his current medications. He denies being depressed or anxious and he is sleeping quite well. He spends most of his time with her grandchildren and really enjoys it. His social life is quite active. He has no side effects or complaints regarding his medications  The patient returns after 3 months. He is doing well. He breaks a 1 mg Ativan in 2 half doses and takes 4 a day. This is controlling his anxiety. His mood is good. He is sleeping well and enjoying time with his grandchildren  Current psychiatric medication Ativan 1 mg 3 times a day as needed Nortriptyline 10 mg daily Prozac 20 mg daily which is prescribed by primary care physician  Vitals: BP 160/80  Ht '5\' 10"'  (1.778 m)  Wt 201 lb (91.173 kg)  BMI 28.84 kg/m2  Allergies: Allergies   Allergen Reactions  . Lisinopril-Hydrochlorothiazide Swelling    ZESTRIL  . Doxycycline Rash   Medical History: Past Medical History  Diagnosis Date  . GERD (gastroesophageal reflux disease)   . Depression   . Tobacco abuse   . Hyperlipidemia   . Diabetes mellitus   . Allergy     rhinitis  . Hepatitis   . Hypertension   . PVD (peripheral vascular disease)   . AAA (abdominal aortic aneurysm) 3-09    4.7 cm  . Diverticulosis   . Personal history of colonic polyps     adenomas, serrated also  . Nephrolithiasis   . Anxiety   . Cancer   . Cataract   Patient has history of diabetes mellitus, hypertension, peripheral vascular disease, abdominal aortic aneurysm, diverticulosis, GERD and hyperlipidemia.  He sees Dr. Timoteo Gaul regularly and will be going back soon for blood work.   Surgical History: Past Surgical History  Procedure Laterality Date  . Surgical excision basal cell carcinoma    . Lower limb amputation, other toe 5th    . Tympanic eardrum repair    . Percutaneous stent graft repair of infrarenal aaa  11/10    5.6 cm (T. early)  . Colonoscopy  2006, 2008, 2010, 05/27/2011    numerous adenomas - 13 in 2006, 3, 2008, 2 2012 (up to 1 cm), 4 diminutive adenomas, serrated adenomas 2012. Diverticulosis and hemorrhoids  also.  . Eye surgery    . Kidney stone operation    . Vasectomy    . Eye surgery Left    Family History: family history includes Cancer in his brother and brother; Depression in his brother and sister; Diabetes in his sister; Emphysema in his father; Heart disease in his mother; Kidney disease in his sister; Learning disabilities in his sister; Lung disease in his sister; Mental retardation in his sister. There is no history of Colon cancer, Dementia, Alcohol abuse, Drug abuse, Paranoid behavior, Schizophrenia, Anxiety disorder, Bipolar disorder, OCD, Sexual abuse, Physical abuse, or Seizures. Reviewed and the history of his sisters was clarified and the passing of  [name] recorded.  Mental status examination Patient is well groomed well dressed.  He is pleasant.  He maintained good eye contact. His speech is soft clear and coherent. His thought process is logical linear and goal-directed. He denies any active or passive suicidal thoughts or homicidal thoughts.  He denies any auditory or visual hallucination.  He described his mood as good and his affect is mood congruent.  There no psychotic symptoms present. He's alert and oriented x3. His insight judgment and impulse control is okay.  Lab Results:  Results for orders placed in visit on 05/10/14 (from the past 8736 hour(s))  BUN   Collection Time    05/10/14 10:43 AM      Result Value Ref Range   BUN 24 (*) 6 - 23 mg/dL  CREATININE, SERUM   Collection Time    05/10/14 10:43 AM      Result Value Ref Range   Creat 1.20  0.50 - 1.35 mg/dL  Results for orders placed in visit on 04/12/14 (from the past 8736 hour(s))  CBC WITH DIFFERENTIAL   Collection Time    04/12/14  8:10 AM      Result Value Ref Range   WBC 5.3  4.0 - 10.5 K/uL   RBC 4.77  4.22 - 5.81 MIL/uL   Hemoglobin 15.3  13.0 - 17.0 g/dL   HCT 45.7  39.0 - 52.0 %   MCV 95.8  78.0 - 100.0 fL   MCH 32.1  26.0 - 34.0 pg   MCHC 33.5  30.0 - 36.0 g/dL   RDW 14.3  11.5 - 15.5 %   Platelets 233  150 - 400 K/uL   Neutrophils Relative % 44  43 - 77 %   Neutro Abs 2.3  1.7 - 7.7 K/uL   Lymphocytes Relative 41  12 - 46 %   Lymphs Abs 2.2  0.7 - 4.0 K/uL   Monocytes Relative 11  3 - 12 %   Monocytes Absolute 0.6  0.1 - 1.0 K/uL   Eosinophils Relative 3  0 - 5 %   Eosinophils Absolute 0.2  0.0 - 0.7 K/uL   Basophils Relative 1  0 - 1 %   Basophils Absolute 0.1  0.0 - 0.1 K/uL   Smear Review Criteria for review not met    HEMOGLOBIN A1C   Collection Time    04/12/14  8:10 AM      Result Value Ref Range   Hemoglobin A1C 6.8 (*) <5.7 %   Mean Plasma Glucose 148 (*) <117 mg/dL  LIPID PANEL   Collection Time    04/12/14  8:10 AM      Result  Value Ref Range   Cholesterol 136  0 - 200 mg/dL   Triglycerides 104  <150 mg/dL   HDL 48  >39  mg/dL   Total CHOL/HDL Ratio 2.8     VLDL 21  0 - 40 mg/dL   LDL Cholesterol 67  0 - 99 mg/dL  COMPLETE METABOLIC PANEL WITH GFR   Collection Time    04/12/14  8:10 AM      Result Value Ref Range   Sodium 139  135 - 145 mEq/L   Potassium 4.2  3.5 - 5.3 mEq/L   Chloride 100  96 - 112 mEq/L   CO2 28  19 - 32 mEq/L   Glucose, Bld 141 (*) 70 - 99 mg/dL   BUN 24 (*) 6 - 23 mg/dL   Creat 1.05  0.50 - 1.35 mg/dL   Total Bilirubin 0.3  0.2 - 1.2 mg/dL   Alkaline Phosphatase 36 (*) 39 - 117 U/L   AST 17  0 - 37 U/L   ALT 13  0 - 53 U/L   Total Protein 6.5  6.0 - 8.3 g/dL   Albumin 4.1  3.5 - 5.2 g/dL   Calcium 9.0  8.4 - 10.5 mg/dL   GFR, Est African American 78     GFR, Est Non African American 68    Results for orders placed during the hospital encounter of 02/22/14 (from the past 8736 hour(s))  CBG MONITORING, ED   Collection Time    02/22/14  3:56 PM      Result Value Ref Range   Glucose-Capillary 127 (*) 70 - 99 mg/dL  CBC WITH DIFFERENTIAL   Collection Time    02/22/14  4:00 PM      Result Value Ref Range   WBC 6.1  4.0 - 10.5 K/uL   RBC 4.63  4.22 - 5.81 MIL/uL   Hemoglobin 15.2  13.0 - 17.0 g/dL   HCT 44.1  39.0 - 52.0 %   MCV 95.2  78.0 - 100.0 fL   MCH 32.8  26.0 - 34.0 pg   MCHC 34.5  30.0 - 36.0 g/dL   RDW 14.5  11.5 - 15.5 %   Platelets 221  150 - 400 K/uL   Neutrophils Relative % 51  43 - 77 %   Neutro Abs 3.1  1.7 - 7.7 K/uL   Lymphocytes Relative 36  12 - 46 %   Lymphs Abs 2.2  0.7 - 4.0 K/uL   Monocytes Relative 11  3 - 12 %   Monocytes Absolute 0.7  0.1 - 1.0 K/uL   Eosinophils Relative 2  0 - 5 %   Eosinophils Absolute 0.1  0.0 - 0.7 K/uL   Basophils Relative 0  0 - 1 %   Basophils Absolute 0.0  0.0 - 0.1 K/uL  BASIC METABOLIC PANEL   Collection Time    02/22/14  4:00 PM      Result Value Ref Range   Sodium 139  137 - 147 mEq/L   Potassium 3.7  3.7 -  5.3 mEq/L   Chloride 101  96 - 112 mEq/L   CO2 27  19 - 32 mEq/L   Glucose, Bld 141 (*) 70 - 99 mg/dL   BUN 27 (*) 6 - 23 mg/dL   Creatinine, Ser 1.05  0.50 - 1.35 mg/dL   Calcium 9.3  8.4 - 10.5 mg/dL   GFR calc non Af Amer 66 (*) >90 mL/min   GFR calc Af Amer 76 (*) >90 mL/min  TROPONIN I   Collection Time    02/22/14  4:00 PM      Result Value Ref  Range   Troponin I <0.30  <0.30 ng/mL  Results for orders placed during the hospital encounter of 11/15/13 (from the past 8736 hour(s))  CBC WITH DIFFERENTIAL   Collection Time    11/15/13  4:34 PM      Result Value Ref Range   WBC 6.3  4.0 - 10.5 K/uL   RBC 5.13  4.22 - 5.81 MIL/uL   Hemoglobin 16.8  13.0 - 17.0 g/dL   HCT 48.6  39.0 - 52.0 %   MCV 94.7  78.0 - 100.0 fL   MCH 32.7  26.0 - 34.0 pg   MCHC 34.6  30.0 - 36.0 g/dL   RDW 14.0  11.5 - 15.5 %   Platelets 208  150 - 400 K/uL   Neutrophils Relative % 45  43 - 77 %   Neutro Abs 2.8  1.7 - 7.7 K/uL   Lymphocytes Relative 37  12 - 46 %   Lymphs Abs 2.3  0.7 - 4.0 K/uL   Monocytes Relative 12  3 - 12 %   Monocytes Absolute 0.8  0.1 - 1.0 K/uL   Eosinophils Relative 5  0 - 5 %   Eosinophils Absolute 0.3  0.0 - 0.7 K/uL   Basophils Relative 1  0 - 1 %   Basophils Absolute 0.1  0.0 - 0.1 K/uL  COMPREHENSIVE METABOLIC PANEL   Collection Time    11/15/13  4:34 PM      Result Value Ref Range   Sodium 136 (*) 137 - 147 mEq/L   Potassium 4.0  3.7 - 5.3 mEq/L   Chloride 95 (*) 96 - 112 mEq/L   CO2 30  19 - 32 mEq/L   Glucose, Bld 160 (*) 70 - 99 mg/dL   BUN 23  6 - 23 mg/dL   Creatinine, Ser 1.21  0.50 - 1.35 mg/dL   Calcium 9.9  8.4 - 10.5 mg/dL   Total Protein 6.9  6.0 - 8.3 g/dL   Albumin 3.5  3.5 - 5.2 g/dL   AST 18  0 - 37 U/L   ALT 16  0 - 53 U/L   Alkaline Phosphatase 46  39 - 117 U/L   Total Bilirubin 0.2 (*) 0.3 - 1.2 mg/dL   GFR calc non Af Amer 56 (*) >90 mL/min   GFR calc Af Amer 64 (*) >90 mL/min  TROPONIN I   Collection Time    11/15/13  4:34 PM       Result Value Ref Range   Troponin I <0.30  <0.30 ng/mL  PCP draws routine labs and nothing is emerging as of concern.  Assessment Axis I Depressive disorder NOS Axis II deferred Axis III see medical history Axis IV mild to moderate Axis V 65-70  Plan/Discussion: I took his vitals.  I reviewed CC, tobacco/med/surg Hx, meds effects/ side effects, problem list, therapies and responses as well as current situation/symptoms discussed options. Continue current effective medications  He'll return in 4 months See orders and pt instructions for more details.  MEDICATIONS this encounter: Meds ordered this encounter  Medications  . FLUoxetine (PROZAC) 20 MG capsule    Sig: Take 1 capsule (20 mg total) by mouth every morning.    Dispense:  90 capsule    Refill:  3  . nortriptyline (PAMELOR) 10 MG capsule    Sig: Take 1 capsule (10 mg total) by mouth at bedtime.    Dispense:  90 capsule    Refill:  1  .  LORazepam (ATIVAN) 1 MG tablet    Sig: Take 1 tablet (1 mg total) by mouth 3 (three) times daily.    Dispense:  90 tablet    Refill:  3    Medical Decision Making Problem Points:  Established problem, stable/improving (1), Review of last therapy session (1) and Review of psycho-social stressors (1) Data Points:  Review or order clinical lab tests (1) Review of medication regiment & side effects (2)  I certify that outpatient services furnished can reasonably be expected to improve the patient's condition.   [name], MD

## 2014-08-16 DIAGNOSIS — Z23 Encounter for immunization: Secondary | ICD-10-CM | POA: Diagnosis not present

## 2014-11-26 ENCOUNTER — Ambulatory Visit (HOSPITAL_COMMUNITY): Payer: Self-pay | Admitting: Psychiatry

## 2014-12-10 ENCOUNTER — Encounter (HOSPITAL_COMMUNITY): Payer: Self-pay | Admitting: Psychiatry

## 2014-12-10 ENCOUNTER — Ambulatory Visit (INDEPENDENT_AMBULATORY_CARE_PROVIDER_SITE_OTHER): Payer: Medicare Other | Admitting: Psychiatry

## 2014-12-10 VITALS — BP 154/86 | HR 98 | Ht 70.0 in | Wt 197.0 lb

## 2014-12-10 DIAGNOSIS — F329 Major depressive disorder, single episode, unspecified: Secondary | ICD-10-CM | POA: Diagnosis not present

## 2014-12-10 DIAGNOSIS — F331 Major depressive disorder, recurrent, moderate: Secondary | ICD-10-CM

## 2014-12-10 DIAGNOSIS — F341 Dysthymic disorder: Secondary | ICD-10-CM

## 2014-12-10 DIAGNOSIS — F418 Other specified anxiety disorders: Secondary | ICD-10-CM

## 2014-12-10 DIAGNOSIS — F5105 Insomnia due to other mental disorder: Secondary | ICD-10-CM

## 2014-12-10 MED ORDER — LORAZEPAM 1 MG PO TABS: 1.0000 mg | ORAL_TABLET | Freq: Three times a day (TID) | ORAL | Status: DC

## 2014-12-10 MED ORDER — NORTRIPTYLINE HCL 10 MG PO CAPS: 10.0000 mg | ORAL_CAPSULE | Freq: Every day | ORAL | Status: DC

## 2014-12-10 MED ORDER — FLUOXETINE HCL 20 MG PO CAPS: 20.0000 mg | ORAL_CAPSULE | Freq: Every morning | ORAL | Status: DC

## 2014-12-10 NOTE — Progress Notes (Signed)
Patient ID: XZAVIAN SEMMEL, male   DOB: 02/17/1933, 81 y.o.   MRN: [id number] Patient ID: MARQUIS DOWN, male   DOB: [birth year], 81 y.o.   MRN: [id number] Patient ID: HUGH KAMARA, male   DOB: Mar 17, 1933, 81 y.o.   MRN: [id number] Patient ID: RENARDO CHEATUM, male   DOB: [birth year], 81 y.o.   MRN: [id number] Patient ID: LEVY CEDANO, male   DOB: [birth year], 81 y.o.   MRN: [id number] Patient ID: JUANDIEGO KOLENOVIC, male   DOB: April 13, 1933, 81 y.o.   MRN: [id number] Patient ID: SALBADOR FIVEASH, male   DOB: 1933-05-02, 81 y.o.   MRN: [id number] [facility] [address] Progress Note AZZAM MEHRA MRN: [id number] DOB: 1933/07/30 Age: 81 y.o.  Date: 12/10/2014 Start Time: 10:15 AM End Time: 10:30 AM  Chief Complaint: Chief Complaint  Patient presents with  . Depression  . Anxiety  . Follow-up   Subjective: I'm doing well".  This patient is a 81 year-old widowed white male who lives alone in [place]. He has 2 children and 3 grandchildren who live in [place]. 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  The patient states that he developed anxiety and depression in his 48s. His depression worsened when his wife died suddenly 37 years ago. However he is doing very well on his current medications. He denies being depressed or anxious and he is sleeping quite well. He spends most of his time with her grandchildren and really enjoys it. His social life is quite active. He has no side effects or complaints regarding his medications  The patient returns after 3 months. He is doing well. He breaks a 1 mg Ativan in 2 half doses and takes 4 a day. This is controlling his anxiety. His mood is good. He is sleeping well and enjoying time with his grandchildren. He continues to get out and be very active  Current psychiatric medication Ativan 0.5 mg 4 times a day Nortriptyline 10 mg daily Prozac 20 mg daily   Vitals: BP 154/86 mmHg  Pulse 98   Ht '5\' 10"'  (1.778 m)  Wt 197 lb (89.359 kg)  BMI 28.27 kg/m2  Allergies: Allergies  Allergen Reactions  . Lisinopril-Hydrochlorothiazide Swelling    ZESTRIL  . Doxycycline Rash   Medical History: Past Medical History  Diagnosis Date  . GERD (gastroesophageal reflux disease)   . Depression   . Tobacco abuse   . Hyperlipidemia   . Diabetes mellitus   . Allergy     rhinitis  . Hepatitis   . Hypertension   . PVD (peripheral vascular disease)   . AAA (abdominal aortic aneurysm) 3-09    4.7 cm  . Diverticulosis   . Personal history of colonic polyps     adenomas, serrated also  . Nephrolithiasis   . Anxiety   . Cancer   . Cataract   Patient has history of diabetes mellitus, hypertension, peripheral vascular disease, abdominal aortic aneurysm, diverticulosis, GERD and hyperlipidemia.  He sees Dr. Timoteo Gaul regularly and will be going back soon for blood work.   Surgical History: Past Surgical History  Procedure Laterality Date  . Surgical excision basal cell carcinoma    . Lower limb amputation, other toe 5th    . Tympanic eardrum repair    . Percutaneous stent graft repair of infrarenal aaa  11/10    5.6 cm (T. early)  . Colonoscopy  2006, 2008, 2010, 05/27/2011    numerous adenomas -  13 in 2006, 3, 2008, 2 2012 (up to 1 cm), 4 diminutive adenomas, serrated adenomas 2012. Diverticulosis and hemorrhoids also.  . Eye surgery    . Kidney stone operation    . Vasectomy    . Eye surgery Left    Family History: family history includes Cancer in his brother and brother; Depression in his brother and sister; Diabetes in his sister; Emphysema in his father; Heart disease in his mother; Kidney disease in his sister; Learning disabilities in his sister; Lung disease in his sister; Mental retardation in his sister. There is no history of Colon cancer, Dementia, Alcohol abuse, Drug abuse, Paranoid behavior, Schizophrenia, Anxiety disorder, Bipolar disorder, OCD, Sexual abuse, Physical abuse,  or Seizures. Reviewed and the history of his sisters was clarified and the passing of [name] recorded.  Mental status examination Patient is well groomed well dressed.  He is pleasant.  He maintained good eye contact. His speech is soft clear and coherent. His thought process is logical linear and goal-directed. He denies any active or passive suicidal thoughts or homicidal thoughts.  He denies any auditory or visual hallucination.  He described his mood as good and his affect is mood congruent.  There no psychotic symptoms present. He's alert and oriented x3. His insight judgment and impulse control is okay.  Lab Results:  Results for orders placed or performed in visit on 05/10/14 (from the past 8736 hour(s))  BUN   Collection Time: 05/10/14 10:43 AM  Result Value Ref Range   BUN 24 (H) 6 - 23 mg/dL  Creatinine, serum   Collection Time: 05/10/14 10:43 AM  Result Value Ref Range   Creat 1.20 0.50 - 1.35 mg/dL  Results for orders placed or performed in visit on 04/12/14 (from the past 8736 hour(s))  CBC with Differential   Collection Time: 04/12/14  8:10 AM  Result Value Ref Range   WBC 5.3 4.0 - 10.5 K/uL   RBC 4.77 4.22 - 5.81 MIL/uL   Hemoglobin 15.3 13.0 - 17.0 g/dL   HCT 45.7 39.0 - 52.0 %   MCV 95.8 78.0 - 100.0 fL   MCH 32.1 26.0 - 34.0 pg   MCHC 33.5 30.0 - 36.0 g/dL   RDW 14.3 11.5 - 15.5 %   Platelets 233 150 - 400 K/uL   Neutrophils Relative % 44 43 - 77 %   Neutro Abs 2.3 1.7 - 7.7 K/uL   Lymphocytes Relative 41 12 - 46 %   Lymphs Abs 2.2 0.7 - 4.0 K/uL   Monocytes Relative 11 3 - 12 %   Monocytes Absolute 0.6 0.1 - 1.0 K/uL   Eosinophils Relative 3 0 - 5 %   Eosinophils Absolute 0.2 0.0 - 0.7 K/uL   Basophils Relative 1 0 - 1 %   Basophils Absolute 0.1 0.0 - 0.1 K/uL   Smear Review Criteria for review not met   Hemoglobin A1c   Collection Time: 04/12/14  8:10 AM  Result Value Ref Range   Hgb A1c MFr Bld 6.8 (H) <5.7 %   Mean Plasma Glucose 148 (H) <117 mg/dL   Lipid Panel   Collection Time: 04/12/14  8:10 AM  Result Value Ref Range   Cholesterol 136 0 - 200 mg/dL   Triglycerides 104 <150 mg/dL   HDL 48 >39 mg/dL   Total CHOL/HDL Ratio 2.8 Ratio   VLDL 21 0 - 40 mg/dL   LDL Cholesterol 67 0 - 99 mg/dL  COMPLETE METABOLIC PANEL WITH GFR  Collection Time: 04/12/14  8:10 AM  Result Value Ref Range   Sodium 139 135 - 145 mEq/L   Potassium 4.2 3.5 - 5.3 mEq/L   Chloride 100 96 - 112 mEq/L   CO2 28 19 - 32 mEq/L   Glucose, Bld 141 (H) 70 - 99 mg/dL   BUN 24 (H) 6 - 23 mg/dL   Creat 1.05 0.50 - 1.35 mg/dL   Total Bilirubin 0.3 0.2 - 1.2 mg/dL   Alkaline Phosphatase 36 (L) 39 - 117 U/L   AST 17 0 - 37 U/L   ALT 13 0 - 53 U/L   Total Protein 6.5 6.0 - 8.3 g/dL   Albumin 4.1 3.5 - 5.2 g/dL   Calcium 9.0 8.4 - 10.5 mg/dL   GFR, Est African American 78 mL/min   GFR, Est Non African American 68 mL/min  Results for orders placed or performed during the hospital encounter of 02/22/14 (from the past 8736 hour(s))  POC CBG, ED   Collection Time: 02/22/14  3:56 PM  Result Value Ref Range   Glucose-Capillary 127 (H) 70 - 99 mg/dL  CBC with Differential   Collection Time: 02/22/14  4:00 PM  Result Value Ref Range   WBC 6.1 4.0 - 10.5 K/uL   RBC 4.63 4.22 - 5.81 MIL/uL   Hemoglobin 15.2 13.0 - 17.0 g/dL   HCT 44.1 39.0 - 52.0 %   MCV 95.2 78.0 - 100.0 fL   MCH 32.8 26.0 - 34.0 pg   MCHC 34.5 30.0 - 36.0 g/dL   RDW 14.5 11.5 - 15.5 %   Platelets 221 150 - 400 K/uL   Neutrophils Relative % 51 43 - 77 %   Neutro Abs 3.1 1.7 - 7.7 K/uL   Lymphocytes Relative 36 12 - 46 %   Lymphs Abs 2.2 0.7 - 4.0 K/uL   Monocytes Relative 11 3 - 12 %   Monocytes Absolute 0.7 0.1 - 1.0 K/uL   Eosinophils Relative 2 0 - 5 %   Eosinophils Absolute 0.1 0.0 - 0.7 K/uL   Basophils Relative 0 0 - 1 %   Basophils Absolute 0.0 0.0 - 0.1 K/uL  Basic metabolic panel   Collection Time: 02/22/14  4:00 PM  Result Value Ref Range   Sodium 139 137 - 147 mEq/L    Potassium 3.7 3.7 - 5.3 mEq/L   Chloride 101 96 - 112 mEq/L   CO2 27 19 - 32 mEq/L   Glucose, Bld 141 (H) 70 - 99 mg/dL   BUN 27 (H) 6 - 23 mg/dL   Creatinine, Ser 1.05 0.50 - 1.35 mg/dL   Calcium 9.3 8.4 - 10.5 mg/dL   GFR calc non Af Amer 66 (L) >90 mL/min   GFR calc Af Amer 76 (L) >90 mL/min  Troponin I   Collection Time: 02/22/14  4:00 PM  Result Value Ref Range   Troponin I <0.30 <0.30 ng/mL  PCP draws routine labs and nothing is emerging as of concern.  Assessment Axis I Depressive disorder NOS Axis II deferred Axis III see medical history Axis IV mild to moderate Axis V 65-70  Plan/Discussion: I took his vitals.  I reviewed CC, tobacco/med/surg Hx, meds effects/ side effects, problem list, therapies and responses as well as current situation/symptoms discussed options. Continue current effective medications  He'll return in 4 months See orders and pt instructions for more details.  MEDICATIONS this encounter: Meds ordered this encounter  Medications  . FLUoxetine (PROZAC) 20 MG capsule  Sig: Take 1 capsule (20 mg total) by mouth every morning.    Dispense:  90 capsule    Refill:  3  . nortriptyline (PAMELOR) 10 MG capsule    Sig: Take 1 capsule (10 mg total) by mouth at bedtime.    Dispense:  90 capsule    Refill:  3  . LORazepam (ATIVAN) 1 MG tablet    Sig: Take 1 tablet (1 mg total) by mouth 3 (three) times daily.    Dispense:  90 tablet    Refill:  3    Medical Decision Making Problem Points:  Established problem, stable/improving (1), Review of last therapy session (1) and Review of psycho-social stressors (1) Data Points:  Review or order clinical lab tests (1) Review of medication regiment & side effects (2)  I certify that outpatient services furnished can reasonably be expected to improve the patient's condition.   [name], MD

## 2015-01-21 ENCOUNTER — Other Ambulatory Visit: Payer: Self-pay | Admitting: Family Medicine

## 2015-01-21 DIAGNOSIS — E785 Hyperlipidemia, unspecified: Secondary | ICD-10-CM

## 2015-01-21 DIAGNOSIS — I25119 Atherosclerotic heart disease of native coronary artery with unspecified angina pectoris: Secondary | ICD-10-CM

## 2015-01-21 DIAGNOSIS — Z79899 Other long term (current) drug therapy: Secondary | ICD-10-CM

## 2015-01-21 DIAGNOSIS — I10 Essential (primary) hypertension: Secondary | ICD-10-CM

## 2015-01-21 DIAGNOSIS — E119 Type 2 diabetes mellitus without complications: Secondary | ICD-10-CM

## 2015-01-28 NOTE — Progress Notes (Signed)
HPI: FU CAD. Patient underwent cardiac catheterization in 2005; he was found to have nonobstructive disease other than a 90% stenosis in a small branch of the right coronary artery. He was noted to have an 80% iliac stenosis and an abdominal aortic aneurysm. Procedure complicated by peripheral emboli requiring amputation of a digit. Patient has had stent graft of abdominal aortic aneurysm. Since last seen, he denies dyspnea, chest pain, palpitations or syncope.  Current Outpatient Prescriptions  Medication Sig Dispense Refill  . aspirin EC 81 MG tablet Take 81 mg by mouth every morning.    Marland Kitchen atenolol (TENORMIN) 25 MG tablet Take 0.5 tablets (12.5 mg total) by mouth daily. 45 tablet 2  . ezetimibe-simvastatin (VYTORIN) 10-40 MG per tablet Take 1 tablet by mouth at bedtime. 90 tablet 2  . FLUoxetine (PROZAC) 20 MG capsule Take 1 capsule (20 mg total) by mouth every morning. 90 capsule 3  . glimepiride (AMARYL) 1 MG tablet Take 1 tablet (1 mg total) by mouth 2 (two) times daily. 180 tablet 2  . hydrochlorothiazide (HYDRODIURIL) 25 MG tablet Take 1 tablet (25 mg total) by mouth daily. 90 tablet 3  . LORazepam (ATIVAN) 1 MG tablet Take 1 tablet (1 mg total) by mouth 3 (three) times daily. 90 tablet 3  . NIFEdipine (NIFEDICAL XL) 30 MG 24 hr tablet Take 1 tablet (30 mg total) by mouth daily. 90 tablet 3  . nortriptyline (PAMELOR) 10 MG capsule Take 1 capsule (10 mg total) by mouth at bedtime. 90 capsule 3  . ramipril (ALTACE) 10 MG capsule Take 1 capsule (10 mg total) by mouth daily. 90 capsule 3   No current facility-administered medications for this visit.     Past Medical History  Diagnosis Date  . GERD (gastroesophageal reflux disease)   . Depression   . Tobacco abuse   . Hyperlipidemia   . Diabetes mellitus   . Allergy     rhinitis  . Hepatitis   . Hypertension   . PVD (peripheral vascular disease)   . AAA (abdominal aortic aneurysm) 3-09    4.7 cm  . Diverticulosis   .  Personal history of colonic polyps     adenomas, serrated also  . Nephrolithiasis   . Anxiety   . Cancer   . Cataract     Past Surgical History  Procedure Laterality Date  . Surgical excision basal cell carcinoma    . Lower limb amputation, other toe 5th    . Tympanic eardrum repair    . Percutaneous stent graft repair of infrarenal aaa  11/10    5.6 cm (T. early)  . Colonoscopy  2006, 2008, 2010, 05/27/2011    numerous adenomas - 13 in 2006, 3, 2008, 2 2012 (up to 1 cm), 4 diminutive adenomas, serrated adenomas 2012. Diverticulosis and hemorrhoids also.  . Eye surgery    . Kidney stone operation    . Vasectomy    . Eye surgery Left     History   Social History  . Marital Status: Widowed    Spouse Name: N/A  . Number of Children: 2  . Years of Education: N/A   Occupational History  .     Social History Main Topics  . Smoking status: Current Every Day Smoker -- 1.00 packs/day for 60 years    Types: Cigarettes  . Smokeless tobacco: Never Used     Comment: smokes about 16-20 cigs a day as of 04/04/2013  . Alcohol Use: No  .  Drug Use: No  . Sexual Activity:    Partners: Female    Patent examiner Protection: Surgical     Comment: vasectomy   Other Topics Concern  . 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 retired 76 years - keeping busy. Lives alone. 1- step-daughter, 1 son - '68; 3 grandchildren. does odd-jobs, yard work    ROS: no fevers or chills, productive cough, hemoptysis, dysphasia, odynophagia, melena, hematochezia, dysuria, hematuria, rash, seizure activity, orthopnea, PND, pedal edema, claudication. Remaining systems are negative.  Physical Exam: Well-developed well-nourished in no acute distress.  Skin is warm and dry.  HEENT is normal.  Neck is supple.  Chest is clear to auscultation with normal expansion.  Cardiovascular exam is regular rate and rhythm.  Abdominal exam nontender or distended. No masses  palpated. Extremities show no edema. neuro grossly intact  ECG sinus rhythm at a rate of 64. Nonspecific ST changes.

## 2015-01-31 ENCOUNTER — Ambulatory Visit (INDEPENDENT_AMBULATORY_CARE_PROVIDER_SITE_OTHER): Payer: Medicare Other | Admitting: Cardiology

## 2015-01-31 ENCOUNTER — Encounter: Payer: Self-pay | Admitting: Cardiology

## 2015-01-31 VITALS — BP 160/82 | HR 64 | Ht 69.0 in | Wt 194.5 lb

## 2015-01-31 DIAGNOSIS — Z720 Tobacco use: Secondary | ICD-10-CM

## 2015-01-31 DIAGNOSIS — I25119 Atherosclerotic heart disease of native coronary artery with unspecified angina pectoris: Secondary | ICD-10-CM

## 2015-01-31 DIAGNOSIS — I714 Abdominal aortic aneurysm, without rupture, unspecified: Secondary | ICD-10-CM

## 2015-01-31 DIAGNOSIS — I2510 Atherosclerotic heart disease of native coronary artery without angina pectoris: Secondary | ICD-10-CM

## 2015-01-31 DIAGNOSIS — F17200 Nicotine dependence, unspecified, uncomplicated: Secondary | ICD-10-CM

## 2015-01-31 NOTE — Assessment & Plan Note (Signed)
Continue aspirin and statin. 

## 2015-01-31 NOTE — Assessment & Plan Note (Signed)
Followed by vascular surgery. 

## 2015-01-31 NOTE — Assessment & Plan Note (Signed)
Patient counseled on discontinuing. 

## 2015-01-31 NOTE — Assessment & Plan Note (Signed)
Blood pressure mildly elevated but he follows this at home and typically control. continue present medications and follow.

## 2015-01-31 NOTE — Assessment & Plan Note (Signed)
Continue statin. Lipids and liver monitored by primary care. 

## 2015-01-31 NOTE — Patient Instructions (Signed)
Your physician wants you to follow-up in: ONE YEAR WITH DR CRENSHAW You will receive a reminder letter in the mail two months in advance. If you don't receive a letter, please call our office to schedule the follow-up appointment.  

## 2015-02-05 ENCOUNTER — Other Ambulatory Visit: Payer: Medicare Other

## 2015-02-05 DIAGNOSIS — I10 Essential (primary) hypertension: Secondary | ICD-10-CM | POA: Diagnosis not present

## 2015-02-05 DIAGNOSIS — I209 Angina pectoris, unspecified: Secondary | ICD-10-CM | POA: Diagnosis not present

## 2015-02-05 DIAGNOSIS — E785 Hyperlipidemia, unspecified: Secondary | ICD-10-CM | POA: Diagnosis not present

## 2015-02-05 DIAGNOSIS — E119 Type 2 diabetes mellitus without complications: Secondary | ICD-10-CM

## 2015-02-05 DIAGNOSIS — Z79899 Other long term (current) drug therapy: Secondary | ICD-10-CM

## 2015-02-05 DIAGNOSIS — I2510 Atherosclerotic heart disease of native coronary artery without angina pectoris: Secondary | ICD-10-CM | POA: Diagnosis not present

## 2015-02-05 DIAGNOSIS — I25119 Atherosclerotic heart disease of native coronary artery with unspecified angina pectoris: Secondary | ICD-10-CM | POA: Diagnosis not present

## 2015-02-05 LAB — COMPLETE METABOLIC PANEL WITH GFR
ALT: 13 U/L (ref 0–53)
AST: 16 U/L (ref 0–37)
Albumin: 3.6 g/dL (ref 3.5–5.2)
Alkaline Phosphatase: 39 U/L (ref 39–117)
BUN: 30 mg/dL — AB (ref 6–23)
CALCIUM: 9.1 mg/dL (ref 8.4–10.5)
CO2: 25 meq/L (ref 19–32)
CREATININE: 1.1 mg/dL (ref 0.50–1.35)
Chloride: 105 mEq/L (ref 96–112)
GFR, EST AFRICAN AMERICAN: 73 mL/min
GFR, Est Non African American: 64 mL/min
Glucose, Bld: 131 mg/dL — ABNORMAL HIGH (ref 70–99)
POTASSIUM: 4.1 meq/L (ref 3.5–5.3)
Sodium: 140 mEq/L (ref 135–145)
Total Bilirubin: 0.5 mg/dL (ref 0.2–1.2)
Total Protein: 6.1 g/dL (ref 6.0–8.3)

## 2015-02-05 LAB — CBC WITH DIFFERENTIAL/PLATELET
Basophils Absolute: 0.1 10*3/uL (ref 0.0–0.1)
Basophils Relative: 1 % (ref 0–1)
EOS ABS: 0.1 10*3/uL (ref 0.0–0.7)
Eosinophils Relative: 2 % (ref 0–5)
HCT: 46 % (ref 39.0–52.0)
HEMOGLOBIN: 15.4 g/dL (ref 13.0–17.0)
Lymphocytes Relative: 35 % (ref 12–46)
Lymphs Abs: 2 10*3/uL (ref 0.7–4.0)
MCH: 31.2 pg (ref 26.0–34.0)
MCHC: 33.5 g/dL (ref 30.0–36.0)
MCV: 93.1 fL (ref 78.0–100.0)
MONOS PCT: 11 % (ref 3–12)
MPV: 10.7 fL (ref 8.6–12.4)
Monocytes Absolute: 0.6 10*3/uL (ref 0.1–1.0)
NEUTROS ABS: 3 10*3/uL (ref 1.7–7.7)
Neutrophils Relative %: 51 % (ref 43–77)
Platelets: 235 10*3/uL (ref 150–400)
RBC: 4.94 MIL/uL (ref 4.22–5.81)
RDW: 14.3 % (ref 11.5–15.5)
WBC: 5.8 10*3/uL (ref 4.0–10.5)

## 2015-02-05 LAB — HEMOGLOBIN A1C
HEMOGLOBIN A1C: 7.3 % — AB (ref <5.7)
Mean Plasma Glucose: 163 mg/dL — ABNORMAL HIGH (ref <117)

## 2015-02-05 LAB — TSH: TSH: 2.08 u[IU]/mL (ref 0.350–4.500)

## 2015-02-05 LAB — LIPID PANEL
CHOL/HDL RATIO: 2.5 ratio
Cholesterol: 109 mg/dL (ref 0–200)
HDL: 43 mg/dL (ref >=40)
LDL CALC: 48 mg/dL (ref 0–99)
TRIGLYCERIDES: 90 mg/dL (ref <150)
VLDL: 18 mg/dL (ref 0–40)

## 2015-02-12 ENCOUNTER — Other Ambulatory Visit: Payer: Self-pay

## 2015-02-17 ENCOUNTER — Encounter: Payer: Self-pay | Admitting: Family Medicine

## 2015-02-17 ENCOUNTER — Ambulatory Visit (INDEPENDENT_AMBULATORY_CARE_PROVIDER_SITE_OTHER): Payer: Medicare Other | Admitting: Family Medicine

## 2015-02-17 VITALS — BP 144/72 | HR 68 | Temp 98.2°F | Resp 16 | Ht 69.0 in | Wt 194.0 lb

## 2015-02-17 DIAGNOSIS — F341 Dysthymic disorder: Secondary | ICD-10-CM

## 2015-02-17 DIAGNOSIS — N528 Other male erectile dysfunction: Secondary | ICD-10-CM | POA: Diagnosis not present

## 2015-02-17 DIAGNOSIS — E785 Hyperlipidemia, unspecified: Secondary | ICD-10-CM | POA: Diagnosis not present

## 2015-02-17 DIAGNOSIS — I10 Essential (primary) hypertension: Secondary | ICD-10-CM

## 2015-02-17 DIAGNOSIS — E119 Type 2 diabetes mellitus without complications: Secondary | ICD-10-CM | POA: Diagnosis not present

## 2015-02-17 DIAGNOSIS — N529 Male erectile dysfunction, unspecified: Secondary | ICD-10-CM | POA: Insufficient documentation

## 2015-02-17 DIAGNOSIS — Z0000 Encounter for general adult medical examination without abnormal findings: Secondary | ICD-10-CM | POA: Diagnosis not present

## 2015-02-17 DIAGNOSIS — Z23 Encounter for immunization: Secondary | ICD-10-CM

## 2015-02-17 NOTE — Assessment & Plan Note (Signed)
Blood pressure looks okay based on his age I'm not to change his medication

## 2015-02-17 NOTE — Patient Instructions (Signed)
Schedule appointment for Colonoscopy- We will send letter with urine results Continue current medications Prevnar 13 booster given F/U 3 months

## 2015-02-17 NOTE — Progress Notes (Signed)
Patient ID: Barry [name], male   DOB: 03/14/1933, 81 y.o.   MRN: [id number] Subjective:   Patient presents for Medicare Annual/Subsequent preventive examination.  Patient here for a wellness exam. He has no particular concerns today. He did request Viagra samples if we had some due to erectile dysfunction.  Diabetes mellitus his A1c returned at 7.3% he states he he has not been watching his diet very much his blood sugars fasting are typically 120 or less. He does eat a snack before bedtime as he is concerned about hypoglycemia in the morning.  I reviewed his recent cardiology note. He is due to have repeat colonoscopy with Dr. Carlean Purl. He is due for an eye exam this May. Review Past Medical/Family/Social: Per EMR   Risk Factors  Current exercise habits: walks Dietary issues discussed: Yes  Cardiac risk factors: has CAD, DM,  HTN  Depression Screen  (Note: if answer to either of the following is "Yes", a more complete depression screening is indicated)  Over the past two weeks, have you felt down, depressed or hopeless? No Over the past two weeks, have you felt little interest or pleasure in doing things? No Have you lost interest or pleasure in daily life? No Do you often feel hopeless? No Do you cry easily over simple problems? No   Activities of Daily Living  In your present state of health, do you have any difficulty performing the following activities?:  Driving? No  Managing money? No  Feeding yourself? No  Getting from bed to chair? No  Climbing a flight of stairs? No  Preparing food and eating?: No  Bathing or showering? No  Getting dressed: No  Getting to the toilet? No  Using the toilet:No  Moving around from place to place: No  In the past year have you fallen or had a near fall?:No  Are you sexually active? yes Do you have more than one partner? No   Hearing Difficulties: wears hearing aide Do you often ask people to speak up or repeat themselves? No  Do  you experience ringing or noises in your ears? yes Do you have difficulty understanding soft or whispered voices? No  Do you feel that you have a problem with memory? No Do you often misplace items? No  Do you feel safe at home? Yes  Cognitive Testing  Alert? Yes Normal Appearance?Yes  Oriented to person? Yes Place? Yes  Time? Yes  Recall of three objects? Yes  Can perform simple calculations? Yes  Displays appropriate judgment?Yes  Can read the correct time from a watch face?Yes   List the Names of Other Physician/Practitioners you currently use: Cardiology  GI, Eye doctor  Screening Tests / Date Colonoscopy - Due                    Zostavax - Refused  Influenza Vaccine  Tetanus/tdap- UTD  ROS: GEN- denies fatigue, fever, weight loss,weakness, recent illness HEENT- denies eye drainage, change in vision, nasal discharge, CVS- denies chest pain, palpitations RESP- denies SOB, cough, wheeze ABD- denies N/V, change in stools, abd pain GU- denies dysuria, hematuria, dribbling, incontinence MSK- denies joint pain, muscle aches, injury Neuro- denies headache, dizziness, syncope, seizure activity  Physical: GEN- NAD, alert and oriented x3 HEENT- PERRL, EOMI, non injected sclera, pink conjunctiva, MMM, oropharynx clear Neck- Supple, no thryomegaly CVS- RRR, no murmur RESP-CTAB EXT- No edema, missing 5th digit right foot Pulses- Radial, DP- 2+   Assessment:    Annual wellness  medicare exam   Plan:    During the course of the visit the patient was educated and counseled about appropriate screening and preventive services including:  Colorectal cancer screening - pt to schedule  Prevnar 13 given .  Diet review for nutrition referral? Yes ____ Not Indicated __x__  Patient Instructions (the written plan) was given to the patient.  Medicare Attestation  I have personally reviewed:  The patient's medical and social history  Their use of alcohol, tobacco or illicit drugs   Their current medications and supplements  The patient's functional ability including ADLs,fall risks, home safety risks, cognitive, and hearing and visual impairment  Diet and physical activities  Evidence for depression or mood disorders  The patient's weight, height, BMI, and visual acuity have been recorded in the chart. I have made referrals, counseling, and provided education to the patient based on review of the above and I have provided the patient with a written personalized care plan for preventive services.

## 2015-02-17 NOTE — Assessment & Plan Note (Signed)
Cholesterol looks very good with his coronary artery disease him and keep him at the current dose of Vytorin, I plan to recheck his lipids in about 6 months as he is still very low I will consider just decreasing him down to just Lipitor

## 2015-02-17 NOTE — Assessment & Plan Note (Signed)
Mild elevation in A1c before his age and comorbidities this is still very good control. No change to current medications we discussed some dietary changes he can make

## 2015-02-17 NOTE — Assessment & Plan Note (Signed)
Followed by psychiatry well controlled

## 2015-02-17 NOTE — Assessment & Plan Note (Signed)
I did give him 4 tablets of 50 mg Viagra today. He has used multiple times in the past. This is due to low testosterone for his age as well as his other vascular problems

## 2015-02-20 ENCOUNTER — Encounter: Payer: Self-pay | Admitting: Internal Medicine

## 2015-03-05 DIAGNOSIS — H43811 Vitreous degeneration, right eye: Secondary | ICD-10-CM | POA: Diagnosis not present

## 2015-03-05 DIAGNOSIS — H3581 Retinal edema: Secondary | ICD-10-CM | POA: Diagnosis not present

## 2015-03-05 DIAGNOSIS — H35371 Puckering of macula, right eye: Secondary | ICD-10-CM | POA: Diagnosis not present

## 2015-03-26 ENCOUNTER — Telehealth: Payer: Self-pay | Admitting: Family Medicine

## 2015-03-26 NOTE — Telephone Encounter (Signed)
Noted.   Will need company to fax request.

## 2015-03-26 NOTE — Telephone Encounter (Signed)
Patient is changing where he gets his meters and test strips it is now going to be [employer] [facility] Phone is [phone] or [phone]  He will be needing his strips in the beginning of July

## 2015-04-02 ENCOUNTER — Telehealth: Payer: Self-pay | Admitting: Family Medicine

## 2015-04-04 ENCOUNTER — Other Ambulatory Visit: Payer: Self-pay | Admitting: Family Medicine

## 2015-04-05 NOTE — Telephone Encounter (Signed)
Refill appropriate and filled per protocol. 

## 2015-04-10 ENCOUNTER — Encounter (HOSPITAL_COMMUNITY): Payer: Self-pay | Admitting: Psychiatry

## 2015-04-10 ENCOUNTER — Ambulatory Visit (INDEPENDENT_AMBULATORY_CARE_PROVIDER_SITE_OTHER): Payer: Medicare Other | Admitting: Psychiatry

## 2015-04-10 VITALS — BP 143/79 | HR 69 | Ht 69.0 in | Wt 192.8 lb

## 2015-04-10 DIAGNOSIS — F5105 Insomnia due to other mental disorder: Secondary | ICD-10-CM

## 2015-04-10 DIAGNOSIS — F331 Major depressive disorder, recurrent, moderate: Secondary | ICD-10-CM

## 2015-04-10 DIAGNOSIS — F418 Other specified anxiety disorders: Secondary | ICD-10-CM

## 2015-04-10 DIAGNOSIS — F329 Major depressive disorder, single episode, unspecified: Secondary | ICD-10-CM

## 2015-04-10 MED ORDER — FLUOXETINE HCL 20 MG PO CAPS
20.0000 mg | ORAL_CAPSULE | Freq: Every morning | ORAL | Status: DC
Start: 2015-04-10 — End: 2015-08-07

## 2015-04-10 MED ORDER — NORTRIPTYLINE HCL 10 MG PO CAPS: 10.0000 mg | ORAL_CAPSULE | Freq: Every day | ORAL | Status: DC

## 2015-04-10 MED ORDER — LORAZEPAM 1 MG PO TABS: 1.0000 mg | ORAL_TABLET | Freq: Three times a day (TID) | ORAL | Status: DC

## 2015-04-10 NOTE — Progress Notes (Signed)
Patient ID: Barry [name], male   DOB: 08/29/1933, 81 y.o.   MRN: [id number] Patient ID: Barry [name], male   DOB: [birth year], 81 y.o.   MRN: [id number] Patient ID: Barry [name], male   DOB: 1933-03-21, 81 y.o.   MRN: [id number] Patient ID: Barry [name], male   DOB: 04-25-1933, 81 y.o.   MRN: [id number] Patient ID: Barry [name], male   DOB: 05-Apr-1933, 81 y.o.   MRN: [id number] Patient ID: Barry [name], male   DOB: 04-05-1933, 81 y.o.   MRN: [id number] Patient ID: Barry [name], male   DOB: Feb 04, 1933, 81 y.o.   MRN: [id number] Patient ID: Barry [name], male   DOB: [birth year], 81 y.o.   MRN: [id number] [facility] [id number] Progress Note Barry [name] MRN: [id number] DOB: 1932-11-24 Age: 81 y.o.  Date: 04/10/2015 Start Time: 10:15 AM End Time: 10:30 AM  Chief Complaint: Chief Complaint  Patient presents with  . Depression  . Anxiety  . Follow-up   Subjective: I'm doing well".  This patient is a 81 year-old widowed white male who lives alone in [place]. He has 2 children and 3 grandchildren who live in [place]. 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  The patient states that he developed anxiety and depression in his 81s. His depression worsened when his wife died suddenly 9 years ago. However he is doing very well on his current medications. He denies being depressed or anxious and he is sleeping quite well. He spends most of his time with her grandchildren and really enjoys it. His social life is quite active. He has no side effects or complaints regarding his medications  The patient returns after 4 months. He is doing well. He breaks a 1 mg Ativan in 2 half doses and takes 4 a day. This is controlling his anxiety. His mood is good. He is sleeping well and enjoying time with his grandchildren. He continues to get out and be very active mowing his lawn and doing work around his house  Current psychiatric  medication Ativan 0.5 mg 4 times a day Nortriptyline 10 mg daily Prozac 20 mg daily   Vitals: BP 143/79 mmHg  Pulse 69  Ht '5\' 9"'  (1.753 m)  Wt 192 lb 12.8 oz (87.454 kg)  BMI 28.46 kg/m2  Allergies: Allergies  Allergen Reactions  . Lisinopril-Hydrochlorothiazide Swelling    ZESTRIL  . Doxycycline Rash   Medical History: Past Medical History  Diagnosis Date  . GERD (gastroesophageal reflux disease)   . Depression   . Tobacco abuse   . Hyperlipidemia   . Diabetes mellitus   . Allergy     rhinitis  . Hepatitis   . Hypertension   . PVD (peripheral vascular disease)   . AAA (abdominal aortic aneurysm) 3-09    4.7 cm  . Diverticulosis   . Personal history of colonic polyps     adenomas, serrated also  . Nephrolithiasis   . Anxiety   . Cancer   . Cataract   Patient has history of diabetes mellitus, hypertension, peripheral vascular disease, abdominal aortic aneurysm, diverticulosis, GERD and hyperlipidemia.  He sees Dr. Timoteo Gaul regularly and will be going back soon for blood work.   Surgical History: Past Surgical History  Procedure Laterality Date  . Surgical excision basal cell carcinoma    . Lower limb amputation, other toe 5th    . Tympanic eardrum repair    . Percutaneous stent  graft repair of infrarenal aaa  11/10    5.6 cm (T. early)  . Colonoscopy  2006, 2008, 2010, 05/27/2011    numerous adenomas - 13 in 2006, 3, 2008, 2 2012 (up to 1 cm), 4 diminutive adenomas, serrated adenomas 2012. Diverticulosis and hemorrhoids also.  . Eye surgery    . Kidney stone operation    . Vasectomy    . Eye surgery Left    Family History: family history includes Cancer in his brother and brother; Depression in his brother and sister; Diabetes in his sister; Emphysema in his father; Heart disease in his mother; Kidney disease in his sister; Learning disabilities in his sister; Lung disease in his sister; Mental retardation in his sister. There is no history of Colon cancer,  Dementia, Alcohol abuse, Drug abuse, Paranoid behavior, Schizophrenia, Anxiety disorder, Bipolar disorder, OCD, Sexual abuse, Physical abuse, or Seizures. Reviewed and the history of his sisters was clarified and the passing of [name] recorded.  Mental status examination Patient is well groomed well dressed.  He is pleasant.  He maintained good eye contact. His speech is soft clear and coherent. His thought process is logical linear and goal-directed. He denies any active or passive suicidal thoughts or homicidal thoughts.  He denies any auditory or visual hallucination.  He described his mood as good and his affect is mood congruent.  There no psychotic symptoms present. He's alert and oriented x3. His insight judgment and impulse control is okay. His memory functions speech and fund of knowledge are all good  Lab Results:  Results for orders placed or performed in visit on 02/05/15 (from the past 8736 hour(s))  Hemoglobin A1c   Collection Time: 02/05/15  8:17 AM  Result Value Ref Range   Hgb A1c MFr Bld 7.3 (H) <5.7 %   Mean Plasma Glucose 163 (H) <117 mg/dL  COMPLETE METABOLIC PANEL WITH GFR   Collection Time: 02/05/15  8:17 AM  Result Value Ref Range   Sodium 140 135 - 145 mEq/L   Potassium 4.1 3.5 - 5.3 mEq/L   Chloride 105 96 - 112 mEq/L   CO2 25 19 - 32 mEq/L   Glucose, Bld 131 (H) 70 - 99 mg/dL   BUN 30 (H) 6 - 23 mg/dL   Creat 1.10 0.50 - 1.35 mg/dL   Total Bilirubin 0.5 0.2 - 1.2 mg/dL   Alkaline Phosphatase 39 39 - 117 U/L   AST 16 0 - 37 U/L   ALT 13 0 - 53 U/L   Total Protein 6.1 6.0 - 8.3 g/dL   Albumin 3.6 3.5 - 5.2 g/dL   Calcium 9.1 8.4 - 10.5 mg/dL   GFR, Est African American 73 mL/min   GFR, Est Non African American 64 mL/min  TSH   Collection Time: 02/05/15  8:17 AM  Result Value Ref Range   TSH 2.080 0.350 - 4.500 uIU/mL  Lipid panel   Collection Time: 02/05/15  8:17 AM  Result Value Ref Range   Cholesterol 109 0 - 200 mg/dL   Triglycerides 90 <150 mg/dL    HDL 43 >=40 mg/dL   Total CHOL/HDL Ratio 2.5 Ratio   VLDL 18 0 - 40 mg/dL   LDL Cholesterol 48 0 - 99 mg/dL  CBC with Differential/Platelet   Collection Time: 02/05/15  8:17 AM  Result Value Ref Range   WBC 5.8 4.0 - 10.5 K/uL   RBC 4.94 4.22 - 5.81 MIL/uL   Hemoglobin 15.4 13.0 - 17.0 g/dL   HCT  46.0 39.0 - 52.0 %   MCV 93.1 78.0 - 100.0 fL   MCH 31.2 26.0 - 34.0 pg   MCHC 33.5 30.0 - 36.0 g/dL   RDW 14.3 11.5 - 15.5 %   Platelets 235 150 - 400 K/uL   MPV 10.7 8.6 - 12.4 fL   Neutrophils Relative % 51 43 - 77 %   Neutro Abs 3.0 1.7 - 7.7 K/uL   Lymphocytes Relative 35 12 - 46 %   Lymphs Abs 2.0 0.7 - 4.0 K/uL   Monocytes Relative 11 3 - 12 %   Monocytes Absolute 0.6 0.1 - 1.0 K/uL   Eosinophils Relative 2 0 - 5 %   Eosinophils Absolute 0.1 0.0 - 0.7 K/uL   Basophils Relative 1 0 - 1 %   Basophils Absolute 0.1 0.0 - 0.1 K/uL   Smear Review Criteria for review not met   Results for orders placed or performed in visit on 05/10/14 (from the past 8736 hour(s))  BUN   Collection Time: 05/10/14 10:43 AM  Result Value Ref Range   BUN 24 (H) 6 - 23 mg/dL  Creatinine, serum   Collection Time: 05/10/14 10:43 AM  Result Value Ref Range   Creat 1.20 0.50 - 1.35 mg/dL  Results for orders placed or performed in visit on 04/12/14 (from the past 8736 hour(s))  CBC with Differential   Collection Time: 04/12/14  8:10 AM  Result Value Ref Range   WBC 5.3 4.0 - 10.5 K/uL   RBC 4.77 4.22 - 5.81 MIL/uL   Hemoglobin 15.3 13.0 - 17.0 g/dL   HCT 45.7 39.0 - 52.0 %   MCV 95.8 78.0 - 100.0 fL   MCH 32.1 26.0 - 34.0 pg   MCHC 33.5 30.0 - 36.0 g/dL   RDW 14.3 11.5 - 15.5 %   Platelets 233 150 - 400 K/uL   Neutrophils Relative % 44 43 - 77 %   Neutro Abs 2.3 1.7 - 7.7 K/uL   Lymphocytes Relative 41 12 - 46 %   Lymphs Abs 2.2 0.7 - 4.0 K/uL   Monocytes Relative 11 3 - 12 %   Monocytes Absolute 0.6 0.1 - 1.0 K/uL   Eosinophils Relative 3 0 - 5 %   Eosinophils Absolute 0.2 0.0 - 0.7 K/uL    Basophils Relative 1 0 - 1 %   Basophils Absolute 0.1 0.0 - 0.1 K/uL   Smear Review Criteria for review not met   Hemoglobin A1c   Collection Time: 04/12/14  8:10 AM  Result Value Ref Range   Hgb A1c MFr Bld 6.8 (H) <5.7 %   Mean Plasma Glucose 148 (H) <117 mg/dL  Lipid Panel   Collection Time: 04/12/14  8:10 AM  Result Value Ref Range   Cholesterol 136 0 - 200 mg/dL   Triglycerides 104 <150 mg/dL   HDL 48 >39 mg/dL   Total CHOL/HDL Ratio 2.8 Ratio   VLDL 21 0 - 40 mg/dL   LDL Cholesterol 67 0 - 99 mg/dL  COMPLETE METABOLIC PANEL WITH GFR   Collection Time: 04/12/14  8:10 AM  Result Value Ref Range   Sodium 139 135 - 145 mEq/L   Potassium 4.2 3.5 - 5.3 mEq/L   Chloride 100 96 - 112 mEq/L   CO2 28 19 - 32 mEq/L   Glucose, Bld 141 (H) 70 - 99 mg/dL   BUN 24 (H) 6 - 23 mg/dL   Creat 1.05 0.50 - 1.35 mg/dL   Total Bilirubin  0.3 0.2 - 1.2 mg/dL   Alkaline Phosphatase 36 (L) 39 - 117 U/L   AST 17 0 - 37 U/L   ALT 13 0 - 53 U/L   Total Protein 6.5 6.0 - 8.3 g/dL   Albumin 4.1 3.5 - 5.2 g/dL   Calcium 9.0 8.4 - 10.5 mg/dL   GFR, Est African American 78 mL/min   GFR, Est Non African American 68 mL/min  PCP draws routine labs and nothing is emerging as of concern.  Assessment Axis I Depressive disorder NOS Axis II deferred Axis III see medical history Axis IV mild to moderate Axis V 65-70  Plan/Discussion: I took his vitals.  I reviewed CC, tobacco/med/surg Hx, meds effects/ side effects, problem list, therapies and responses as well as current situation/symptoms discussed options. Continue Zyrtec and nortriptyline for depression and lorazepam for anxiety He'll return in 4 months See orders and pt instructions for more details.  MEDICATIONS this encounter: Meds ordered this encounter  Medications  . nortriptyline (PAMELOR) 10 MG capsule    Sig: Take 1 capsule (10 mg total) by mouth at bedtime.    Dispense:  90 capsule    Refill:  3  . FLUoxetine (PROZAC) 20 MG capsule     Sig: Take 1 capsule (20 mg total) by mouth every morning.    Dispense:  90 capsule    Refill:  3  . LORazepam (ATIVAN) 1 MG tablet    Sig: Take 1 tablet (1 mg total) by mouth 3 (three) times daily.    Dispense:  90 tablet    Refill:  3    Medical Decision Making Problem Points:  Established problem, stable/improving (1), Review of last therapy session (1) and Review of psycho-social stressors (1) Data Points:  Review or order clinical lab tests (1) Review of medication regiment & side effects (2)  I certify that outpatient services furnished can reasonably be expected to improve the patient's condition.   [name], MD

## 2015-04-18 ENCOUNTER — Ambulatory Visit (AMBULATORY_SURGERY_CENTER): Payer: Self-pay

## 2015-04-18 VITALS — Ht 69.5 in | Wt 197.0 lb

## 2015-04-18 DIAGNOSIS — Z860100 Personal history of colon polyps, unspecified: Secondary | ICD-10-CM

## 2015-04-18 NOTE — Progress Notes (Signed)
No allergies to eggs or soy No diet/weight loss meds No home oxygen No past problems with anesthesia  No email no computer

## 2015-04-30 ENCOUNTER — Encounter: Payer: Self-pay | Admitting: Internal Medicine

## 2015-05-12 ENCOUNTER — Other Ambulatory Visit: Payer: Self-pay | Admitting: Family Medicine

## 2015-05-12 NOTE — Telephone Encounter (Signed)
Medication refilled per protocol. 

## 2015-05-14 ENCOUNTER — Other Ambulatory Visit: Payer: Self-pay | Admitting: Family Medicine

## 2015-05-14 NOTE — Telephone Encounter (Signed)
Medication refilled per protocol. 

## 2015-05-19 ENCOUNTER — Other Ambulatory Visit: Payer: Medicare Other

## 2015-05-19 DIAGNOSIS — I10 Essential (primary) hypertension: Secondary | ICD-10-CM | POA: Diagnosis not present

## 2015-05-19 DIAGNOSIS — Z79899 Other long term (current) drug therapy: Secondary | ICD-10-CM | POA: Diagnosis not present

## 2015-05-19 DIAGNOSIS — E119 Type 2 diabetes mellitus without complications: Secondary | ICD-10-CM | POA: Diagnosis not present

## 2015-05-19 DIAGNOSIS — Z125 Encounter for screening for malignant neoplasm of prostate: Secondary | ICD-10-CM | POA: Diagnosis not present

## 2015-05-19 DIAGNOSIS — E785 Hyperlipidemia, unspecified: Secondary | ICD-10-CM | POA: Diagnosis not present

## 2015-05-19 DIAGNOSIS — E1165 Type 2 diabetes mellitus with hyperglycemia: Secondary | ICD-10-CM | POA: Diagnosis not present

## 2015-05-19 LAB — COMPLETE METABOLIC PANEL WITH GFR
ALT: 14 U/L (ref 9–46)
AST: 18 U/L (ref 10–35)
Albumin: 3.5 g/dL — ABNORMAL LOW (ref 3.6–5.1)
Alkaline Phosphatase: 36 U/L — ABNORMAL LOW (ref 40–115)
BUN: 29 mg/dL — AB (ref 7–25)
CO2: 29 mmol/L (ref 20–31)
CREATININE: 1.12 mg/dL (ref 0.70–1.18)
Calcium: 8.7 mg/dL (ref 8.6–10.3)
Chloride: 102 mmol/L (ref 98–110)
GFR, Est African American: 72 mL/min (ref >=60)
GFR, Est Non African American: 62 mL/min (ref >=60)
GLUCOSE: 150 mg/dL — AB (ref 70–99)
POTASSIUM: 4.3 mmol/L (ref 3.5–5.3)
SODIUM: 137 mmol/L (ref 135–146)
TOTAL PROTEIN: 6 g/dL — AB (ref 6.1–8.1)
Total Bilirubin: 0.4 mg/dL (ref 0.2–1.2)

## 2015-05-19 LAB — LIPID PANEL
Cholesterol: 115 mg/dL — ABNORMAL LOW (ref 125–200)
HDL: 45 mg/dL (ref >=40)
LDL CALC: 56 mg/dL (ref <130)
TRIGLYCERIDES: 69 mg/dL (ref <150)
Total CHOL/HDL Ratio: 2.6 Ratio (ref <=5.0)
VLDL: 14 mg/dL (ref <30)

## 2015-05-19 LAB — HEMOGLOBIN A1C
Hgb A1c MFr Bld: 7.1 % — ABNORMAL HIGH (ref <5.7)
MEAN PLASMA GLUCOSE: 157 mg/dL — AB (ref <117)

## 2015-05-20 LAB — PSA, MEDICARE: PSA: 0.32 ng/mL (ref <=4.00)

## 2015-05-21 ENCOUNTER — Ambulatory Visit (INDEPENDENT_AMBULATORY_CARE_PROVIDER_SITE_OTHER): Payer: Medicare Other | Admitting: Family Medicine

## 2015-05-21 ENCOUNTER — Encounter: Payer: Self-pay | Admitting: Family Medicine

## 2015-05-21 VITALS — BP 140/72 | HR 78 | Temp 98.1°F | Resp 14 | Ht 70.0 in | Wt 194.0 lb

## 2015-05-21 DIAGNOSIS — E785 Hyperlipidemia, unspecified: Secondary | ICD-10-CM | POA: Diagnosis not present

## 2015-05-21 DIAGNOSIS — I25119 Atherosclerotic heart disease of native coronary artery with unspecified angina pectoris: Secondary | ICD-10-CM | POA: Diagnosis not present

## 2015-05-21 DIAGNOSIS — E119 Type 2 diabetes mellitus without complications: Secondary | ICD-10-CM

## 2015-05-21 DIAGNOSIS — I10 Essential (primary) hypertension: Secondary | ICD-10-CM

## 2015-05-21 DIAGNOSIS — N528 Other male erectile dysfunction: Secondary | ICD-10-CM

## 2015-05-21 NOTE — Assessment & Plan Note (Signed)
4 tabs of 50mg  viagra given, he understands SE

## 2015-05-21 NOTE — Assessment & Plan Note (Signed)
A1c is at goal at 7.1 this is actually improvement from 7.3% his renal function shows minimal decline. He will continue his current medication regimen

## 2015-05-21 NOTE — Progress Notes (Signed)
Patient ID: Barry [name], male   DOB: 24-Jun-1933, 81 y.o.   MRN: [id number]   Subjective:    Patient ID: Barry [name], male    DOB: 1933-09-09, 81 y.o.   MRN: [id number]  Patient presents for 3 month F/U  patient here to follow-up chronic medical problems. He has no particular concerns today. States that he feels well. He's been checking his blood sugars they have been less than 120 fasting he has not had any hypoglycemia symptoms. His colonoscopy was rescheduled next month. He is due for a visit with his eye doctor. He is taking all his medicines as prescribed, no use in taken Vytorin one half tablet for the past couple years. He requested samples of Viagra    Review Of Systems:  GEN- denies fatigue, fever, weight loss,weakness, recent illness HEENT- denies eye drainage, change in vision, nasal discharge, CVS- denies chest pain, palpitations RESP- denies SOB, cough, wheeze ABD- denies N/V, change in stools, abd pain GU- denies dysuria, hematuria, dribbling, incontinence MSK- denies joint pain, muscle aches, injury Neuro- denies headache, dizziness, syncope, seizure activity       Objective:    BP 140/72 mmHg  Pulse 78  Temp(Src) 98.1 F (36.7 C) (Oral)  Resp 14  Ht 5\' 10"  (1.778 m)  Wt 194 lb (87.998 kg)  BMI 27.84 kg/m2 GEN- NAD, alert and oriented x3 HEENT- PERRL, EOMI, non injected sclera, pink conjunctiva, MMM, oropharynx clear Neck- Supple, no bruit  CVS- RRR, no murmur RESP-CTAB EXT- No edema, missing 5th digit right foot  Pulses- Radial, DP- 2+        Assessment & Plan:      Problem List Items Addressed This Visit    Hyperlipidemia - Primary   Essential hypertension   Diabetes mellitus type II, controlled   Relevant Orders   Microalbumin / creatinine urine ratio      Note: This dictation was prepared with Dragon dictation along with smaller phrase technology. Any transcriptional errors that result from this process are unintentional.

## 2015-05-21 NOTE — Patient Instructions (Signed)
Continue currrent medications Labs look good Schedule eye appt- with Dr. Baird Cancer F/U 4 months

## 2015-05-21 NOTE — Assessment & Plan Note (Signed)
Blood pressure is well-controlled and change in medication

## 2015-05-21 NOTE — Assessment & Plan Note (Signed)
He will continue the one half tablet of the Vytorin he really couldn't come down to just simvastatin 20 mg he will set complete what he is on currently. Then I will change in simvastatin 20 mg

## 2015-05-22 LAB — MICROALBUMIN / CREATININE URINE RATIO
Creatinine, Urine: 156.3 mg/dL
Microalb Creat Ratio: 88.9 mg/g — ABNORMAL HIGH (ref 0.0–30.0)
Microalb, Ur: 13.9 mg/dL — ABNORMAL HIGH (ref <2.0)

## 2015-05-28 ENCOUNTER — Other Ambulatory Visit: Payer: Self-pay | Admitting: Family Medicine

## 2015-05-28 MED ORDER — SIMVASTATIN 20 MG PO TABS: 20.0000 mg | ORAL_TABLET | Freq: Every day | ORAL | Status: DC

## 2015-05-28 NOTE — Telephone Encounter (Signed)
Refill for Vytorin denied.   Patient to be changed to Zocor 20mg .   New prescription sent to pharmacy.

## 2015-06-17 ENCOUNTER — Other Ambulatory Visit: Payer: Self-pay | Admitting: Family Medicine

## 2015-06-17 NOTE — Telephone Encounter (Signed)
Refill appropriate and filled per protocol. 

## 2015-06-24 ENCOUNTER — Encounter: Payer: Self-pay | Admitting: Internal Medicine

## 2015-07-01 ENCOUNTER — Telehealth (HOSPITAL_COMMUNITY): Payer: Self-pay | Admitting: *Deleted

## 2015-08-04 DIAGNOSIS — Z23 Encounter for immunization: Secondary | ICD-10-CM | POA: Diagnosis not present

## 2015-08-07 ENCOUNTER — Ambulatory Visit (INDEPENDENT_AMBULATORY_CARE_PROVIDER_SITE_OTHER): Payer: Medicare Other | Admitting: Psychiatry

## 2015-08-07 ENCOUNTER — Encounter (HOSPITAL_COMMUNITY): Payer: Self-pay | Admitting: Psychiatry

## 2015-08-07 VITALS — BP 152/75 | HR 69 | Ht 70.0 in | Wt 191.2 lb

## 2015-08-07 DIAGNOSIS — F331 Major depressive disorder, recurrent, moderate: Secondary | ICD-10-CM

## 2015-08-07 DIAGNOSIS — F341 Dysthymic disorder: Secondary | ICD-10-CM

## 2015-08-07 DIAGNOSIS — F5105 Insomnia due to other mental disorder: Secondary | ICD-10-CM | POA: Diagnosis not present

## 2015-08-07 DIAGNOSIS — I25119 Atherosclerotic heart disease of native coronary artery with unspecified angina pectoris: Secondary | ICD-10-CM

## 2015-08-07 DIAGNOSIS — F418 Other specified anxiety disorders: Secondary | ICD-10-CM

## 2015-08-07 MED ORDER — NORTRIPTYLINE HCL 10 MG PO CAPS: 10.0000 mg | ORAL_CAPSULE | Freq: Every day | ORAL | Status: DC

## 2015-08-07 MED ORDER — FLUOXETINE HCL 20 MG PO CAPS
20.0000 mg | ORAL_CAPSULE | Freq: Every morning | ORAL | Status: DC
Start: 2015-08-07 — End: 2015-12-08

## 2015-08-07 MED ORDER — LORAZEPAM 1 MG PO TABS: 1.0000 mg | ORAL_TABLET | Freq: Three times a day (TID) | ORAL | Status: DC

## 2015-08-07 NOTE — Progress Notes (Signed)
Patient ID: Barry [name], male   DOB: 02/06/1933, 81 y.o.   MRN: [id number] Patient ID: Barry [name], male   DOB: 12/26/1932, 81 y.o.   MRN: [id number] Patient ID: Barry [name], male   DOB: [birth year], 81 y.o.   MRN: [id number] Patient ID: Barry [name], male   DOB: [birth year], 81 y.o.   MRN: [id number] Patient ID: Barry [name], male   DOB: [birth year], 81 y.o.   MRN: [id number] Patient ID: Barry [name], male   DOB: 05-Nov-1932, 81 y.o.   MRN: [id number] Patient ID: Barry [name], male   DOB: Jan 03, 1933, 81 y.o.   MRN: [id number] Patient ID: Barry [name], male   DOB: 1933-04-16, 81 y.o.   MRN: [id number] Patient ID: Barry [name], male   DOB: [birth year], 81 y.o.   MRN: [id number] [facility] [id number] Progress Note Barry [name] MRN: [id number] DOB: 1933/05/31 Age: 81 y.o.  Date: 08/07/2015 Start Time: 10:15 AM End Time: 10:30 AM  Chief Complaint: Chief Complaint  Patient presents with  . Depression  . Anxiety  . Follow-up   Subjective: I'm doing well".  This patient is an 81 year-old widowed white male who lives alone in [place]. He has 2 children and 3 grandchildren who live in [place]. 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  The patient states that he developed anxiety and depression in his 81s. His depression worsened when his wife died suddenly 25 years ago. However he is doing very well on his current medications. He denies being depressed or anxious and he is sleeping quite well. He spends most of his time with her grandchildren and really enjoys it. His social life is quite active. He has no side effects or complaints regarding his medications  The patient returns after 4 months. He is doing well. He breaks a 1 mg Ativan in 2 half doses and takes 4 a day. This is controlling his anxiety. His mood is good. He is sleeping well and enjoying time with his grandchildren. He continues to get out and be  very active mowing his lawn and doing yard work for others as well  Current psychiatric medication Ativan 0.5 mg 4 times a day Nortriptyline 10 mg daily Prozac 20 mg daily   Vitals: BP 152/75 mmHg  Pulse 69  Ht '5\' 10"'  (1.778 m)  Wt 191 lb 3.2 oz (86.728 kg)  BMI 27.43 kg/m2  Allergies: Allergies  Allergen Reactions  . Lisinopril-Hydrochlorothiazide Swelling    ZESTRIL  . Doxycycline Rash   Medical History: Past Medical History  Diagnosis Date  . GERD (gastroesophageal reflux disease)   . Depression   . Tobacco abuse   . Hyperlipidemia   . Diabetes mellitus   . Allergy     rhinitis  . Hepatitis   . Hypertension   . PVD (peripheral vascular disease) ([place])   . AAA (abdominal aortic aneurysm) ([facility]) 3-09    4.7 cm  . Diverticulosis   . Personal history of colonic polyps     adenomas, serrated also  . Nephrolithiasis   . Anxiety   . Cancer ([place])     skin  . Cataract   Patient has history of diabetes mellitus, hypertension, peripheral vascular disease, abdominal aortic aneurysm, diverticulosis, GERD and hyperlipidemia.  He sees Dr. Timoteo Gaul regularly and will be going back soon for blood work.   Surgical History: Past Surgical History  Procedure Laterality Date  . Surgical excision basal  cell carcinoma    . Lower limb amputation, other toe 5th    . Tympanic eardrum repair    . Percutaneous stent graft repair of infrarenal aaa  11/10    5.6 cm (T. early)  . Colonoscopy  2006, 2008, 2010, 05/27/2011    numerous adenomas - 13 in 2006, 3, 2008, 2 2012 (up to 1 cm), 4 diminutive adenomas, serrated adenomas 2012. Diverticulosis and hemorrhoids also.  . Eye surgery    . Kidney stone operation    . Vasectomy    . Eye surgery Left    Family History: family history includes Cancer in his brother and brother; Depression in his brother and sister; Diabetes in his sister; Emphysema in his father; Heart disease in his mother; Kidney disease in his sister; Learning disabilities in  his sister; Lung disease in his sister; Mental retardation in his sister. There is no history of Colon cancer, Dementia, Alcohol abuse, Drug abuse, Paranoid behavior, Schizophrenia, Anxiety disorder, Bipolar disorder, OCD, Sexual abuse, Physical abuse, or Seizures. Reviewed and the history of his sisters was clarified and the passing of [name] recorded.  Mental status examination Patient is well groomed well dressed.  He is pleasant.  He maintained good eye contact. His speech is soft clear and coherent. His thought process is logical linear and goal-directed. He denies any active or passive suicidal thoughts or homicidal thoughts.  He denies any auditory or visual hallucination.  He described his mood as good and his affect is mood congruent.  There no psychotic symptoms present. He's alert and oriented x3. His insight judgment and impulse control is okay. His memory functions speech and fund of knowledge are all good  Lab Results:  Results for orders placed or performed in visit on 05/21/15 (from the past 8736 hour(s))  Microalbumin / creatinine urine ratio   Collection Time: 05/21/15  9:40 AM  Result Value Ref Range   Microalb, Ur 13.9 (H) <2.0 mg/dL   Creatinine, Urine 156.3 mg/dL   Microalb Creat Ratio 88.9 (H) 0.0 - 30.0 mg/g  Results for orders placed or performed in visit on 05/19/15 (from the past 8736 hour(s))  PSA, Medicare   Collection Time: 05/19/15  8:37 AM  Result Value Ref Range   PSA 0.32 <=4.00 ng/mL  COMPLETE METABOLIC PANEL WITH GFR   Collection Time: 05/19/15  8:37 AM  Result Value Ref Range   Sodium 137 135 - 146 mmol/L   Potassium 4.3 3.5 - 5.3 mmol/L   Chloride 102 98 - 110 mmol/L   CO2 29 20 - 31 mmol/L   Glucose, Bld 150 (H) 70 - 99 mg/dL   BUN 29 (H) 7 - 25 mg/dL   Creat 1.12 0.70 - 1.18 mg/dL   Total Bilirubin 0.4 0.2 - 1.2 mg/dL   Alkaline Phosphatase 36 (L) 40 - 115 U/L   AST 18 10 - 35 U/L   ALT 14 9 - 46 U/L   Total Protein 6.0 (L) 6.1 - 8.1 g/dL    Albumin 3.5 (L) 3.6 - 5.1 g/dL   Calcium 8.7 8.6 - 10.3 mg/dL   GFR, Est African American 72 >=60 mL/min   GFR, Est Non African American 62 >=60 mL/min  Lipid panel   Collection Time: 05/19/15  8:37 AM  Result Value Ref Range   Cholesterol 115 (L) 125 - 200 mg/dL   Triglycerides 69 <150 mg/dL   HDL 45 >=40 mg/dL   Total CHOL/HDL Ratio 2.6 <=5.0 Ratio   VLDL 14 <30  mg/dL   LDL Cholesterol 56 <130 mg/dL  Hemoglobin A1c   Collection Time: 05/19/15  8:37 AM  Result Value Ref Range   Hgb A1c MFr Bld 7.1 (H) <5.7 %   Mean Plasma Glucose 157 (H) <117 mg/dL  Results for orders placed or performed in visit on 02/05/15 (from the past 8736 hour(s))  Hemoglobin A1c   Collection Time: 02/05/15  8:17 AM  Result Value Ref Range   Hgb A1c MFr Bld 7.3 (H) <5.7 %   Mean Plasma Glucose 163 (H) <117 mg/dL  COMPLETE METABOLIC PANEL WITH GFR   Collection Time: 02/05/15  8:17 AM  Result Value Ref Range   Sodium 140 135 - 145 mEq/L   Potassium 4.1 3.5 - 5.3 mEq/L   Chloride 105 96 - 112 mEq/L   CO2 25 19 - 32 mEq/L   Glucose, Bld 131 (H) 70 - 99 mg/dL   BUN 30 (H) 6 - 23 mg/dL   Creat 1.10 0.50 - 1.35 mg/dL   Total Bilirubin 0.5 0.2 - 1.2 mg/dL   Alkaline Phosphatase 39 39 - 117 U/L   AST 16 0 - 37 U/L   ALT 13 0 - 53 U/L   Total Protein 6.1 6.0 - 8.3 g/dL   Albumin 3.6 3.5 - 5.2 g/dL   Calcium 9.1 8.4 - 10.5 mg/dL   GFR, Est African American 73 mL/min   GFR, Est Non African American 64 mL/min  TSH   Collection Time: 02/05/15  8:17 AM  Result Value Ref Range   TSH 2.080 0.350 - 4.500 uIU/mL  Lipid panel   Collection Time: 02/05/15  8:17 AM  Result Value Ref Range   Cholesterol 109 0 - 200 mg/dL   Triglycerides 90 <150 mg/dL   HDL 43 >=40 mg/dL   Total CHOL/HDL Ratio 2.5 Ratio   VLDL 18 0 - 40 mg/dL   LDL Cholesterol 48 0 - 99 mg/dL  CBC with Differential/Platelet   Collection Time: 02/05/15  8:17 AM  Result Value Ref Range   WBC 5.8 4.0 - 10.5 K/uL   RBC 4.94 4.22 - 5.81 MIL/uL    Hemoglobin 15.4 13.0 - 17.0 g/dL   HCT 46.0 39.0 - 52.0 %   MCV 93.1 78.0 - 100.0 fL   MCH 31.2 26.0 - 34.0 pg   MCHC 33.5 30.0 - 36.0 g/dL   RDW 14.3 11.5 - 15.5 %   Platelets 235 150 - 400 K/uL   MPV 10.7 8.6 - 12.4 fL   Neutrophils Relative % 51 43 - 77 %   Neutro Abs 3.0 1.7 - 7.7 K/uL   Lymphocytes Relative 35 12 - 46 %   Lymphs Abs 2.0 0.7 - 4.0 K/uL   Monocytes Relative 11 3 - 12 %   Monocytes Absolute 0.6 0.1 - 1.0 K/uL   Eosinophils Relative 2 0 - 5 %   Eosinophils Absolute 0.1 0.0 - 0.7 K/uL   Basophils Relative 1 0 - 1 %   Basophils Absolute 0.1 0.0 - 0.1 K/uL   Smear Review Criteria for review not met   PCP draws routine labs and nothing is emerging as of concern.  Assessment Axis I Depressive disorder NOS Axis II deferred Axis III see medical history Axis IV mild to moderate Axis V 65-70  Plan/Discussion: I took his vitals.  I reviewed CC, tobacco/med/surg Hx, meds effects/ side effects, problem list, therapies and responses as well as current situation/symptoms discussed options. Continue prozac and nortriptyline for depression and  lorazepam for anxiety He'll return in 4 months See orders and pt instructions for more details.  MEDICATIONS this encounter: Meds ordered this encounter  Medications  . FLUoxetine (PROZAC) 20 MG capsule    Sig: Take 1 capsule (20 mg total) by mouth every morning.    Dispense:  90 capsule    Refill:  3  . nortriptyline (PAMELOR) 10 MG capsule    Sig: Take 1 capsule (10 mg total) by mouth at bedtime.    Dispense:  90 capsule    Refill:  3  . LORazepam (ATIVAN) 1 MG tablet    Sig: Take 1 tablet (1 mg total) by mouth 3 (three) times daily.    Dispense:  90 tablet    Refill:  3  . LORazepam (ATIVAN) 1 MG tablet    Sig: Take 1 tablet (1 mg total) by mouth 3 (three) times daily.    Dispense:  90 tablet    Refill:  3    Medical Decision Making Problem Points:  Established problem, stable/improving (1), Review of last therapy  session (1) and Review of psycho-social stressors (1) Data Points:  Review or order clinical lab tests (1) Review of medication regiment & side effects (2)  I certify that outpatient services furnished can reasonably be expected to improve the patient's condition.   [name], MD

## 2015-08-08 ENCOUNTER — Ambulatory Visit (HOSPITAL_COMMUNITY): Payer: Self-pay | Admitting: Psychiatry

## 2015-08-11 ENCOUNTER — Other Ambulatory Visit: Payer: Self-pay | Admitting: Family Medicine

## 2015-08-11 NOTE — Telephone Encounter (Signed)
Refill appropriate and filled per protocol. 

## 2015-08-20 ENCOUNTER — Encounter: Payer: Self-pay | Admitting: Internal Medicine

## 2015-08-20 ENCOUNTER — Ambulatory Visit (AMBULATORY_SURGERY_CENTER): Payer: Medicare Other | Admitting: Internal Medicine

## 2015-08-20 VITALS — BP 147/62 | HR 55 | Temp 97.3°F | Resp 16 | Ht 70.0 in | Wt 191.0 lb

## 2015-08-20 DIAGNOSIS — E669 Obesity, unspecified: Secondary | ICD-10-CM | POA: Diagnosis not present

## 2015-08-20 DIAGNOSIS — D124 Benign neoplasm of descending colon: Secondary | ICD-10-CM

## 2015-08-20 DIAGNOSIS — E119 Type 2 diabetes mellitus without complications: Secondary | ICD-10-CM | POA: Diagnosis not present

## 2015-08-20 DIAGNOSIS — F329 Major depressive disorder, single episode, unspecified: Secondary | ICD-10-CM | POA: Diagnosis not present

## 2015-08-20 DIAGNOSIS — I739 Peripheral vascular disease, unspecified: Secondary | ICD-10-CM | POA: Diagnosis not present

## 2015-08-20 DIAGNOSIS — Z86010 Personal history of colonic polyps: Secondary | ICD-10-CM | POA: Diagnosis not present

## 2015-08-20 DIAGNOSIS — K219 Gastro-esophageal reflux disease without esophagitis: Secondary | ICD-10-CM | POA: Diagnosis not present

## 2015-08-20 LAB — GLUCOSE, CAPILLARY
Glucose-Capillary: 112 mg/dL — ABNORMAL HIGH (ref 65–99)
Glucose-Capillary: 97 mg/dL (ref 65–99)

## 2015-08-20 MED ORDER — SODIUM CHLORIDE 0.9 % IV SOLN: 500.0000 mL | INTRAVENOUS | Status: DC

## 2015-08-20 NOTE — Patient Instructions (Addendum)
I removed one polyp. I think it is benign and will confirm by mail. No more routine colonoscopy for you!  I appreciate the opportunity to care for you. Gatha Mayer, MD, FACG     YOU HAD AN ENDOSCOPIC PROCEDURE TODAY AT [facility]:   Refer to the procedure report that was given to you for any specific questions about what was found during the examination.  If the procedure report does not answer your questions, please call your gastroenterologist to clarify.  If you requested that your care partner not be given the details of your procedure findings, then the procedure report has been included in a sealed envelope for you to review at your convenience later.  YOU SHOULD EXPECT: Some feelings of bloating in the abdomen. Passage of more gas than usual.  Walking can help get rid of the air that was put into your GI tract during the procedure and reduce the bloating. If you had a lower endoscopy (such as a colonoscopy or flexible sigmoidoscopy) you may notice spotting of blood in your stool or on the toilet paper. If you underwent a bowel prep for your procedure, you may not have a normal bowel movement for a few days.  Please Note:  You might notice some irritation and congestion in your nose or some drainage.  This is from the oxygen used during your procedure.  There is no need for concern and it should clear up in a day or so.  SYMPTOMS TO REPORT IMMEDIATELY:   Following lower endoscopy (colonoscopy or flexible sigmoidoscopy):  Excessive amounts of blood in the stool  Significant tenderness or worsening of abdominal pains  Swelling of the abdomen that is new, acute  Fever of 100F or higher  For urgent or emergent issues, a gastroenterologist can be reached at any hour by calling [phone].   DIET: Your first meal following the procedure should be a small meal and then it is ok to progress to your normal diet. Heavy or fried foods are harder to digest  and may make you feel nauseous or bloated.  Likewise, meals heavy in dairy and vegetables can increase bloating.  Drink plenty of fluids but you should avoid alcoholic beverages for 24 hours.  ACTIVITY:  You should plan to take it easy for the rest of today and you should NOT DRIVE or use heavy machinery until tomorrow (because of the sedation medicines used during the test).    FOLLOW UP: Our staff will call the number listed on your records the next business day following your procedure to check on you and address any questions or concerns that you may have regarding the information given to you following your procedure. If we do not reach you, we will leave a message.  However, if you are feeling well and you are not experiencing any problems, there is no need to return our call.  We will assume that you have returned to your regular daily activities without incident.  If any biopsies were taken you will be contacted by phone or by letter within the next 1-3 weeks.  Please call us at [phone] if you have not heard about the biopsies in 3 weeks.    SIGNATURES/CONFIDENTIALITY: You and/or your care partner have signed paperwork which will be entered into your electronic medical record.  These signatures attest to the fact that that the information above on your After Visit Summary has been reviewed and is understood.  Full responsibility  of the confidentiality of this discharge information lies with you and/or your care-partner.    Handouts were given to your care partner on polyps and diverticulosis. You may resume your current medications today. Your blood sugar was 97 in the recovery room. Await biopsy results. Please call if any questions or concerns.

## 2015-08-20 NOTE — Progress Notes (Signed)
No problems noted in the recovery room. maw 

## 2015-08-20 NOTE — Progress Notes (Signed)
Called to room to assist during endoscopic procedure.  Patient ID and intended procedure confirmed with present staff. Received instructions for my participation in the procedure from the performing physician.  

## 2015-08-20 NOTE — Progress Notes (Signed)
Report to PACU, RN, vss, BBS= Clear.  

## 2015-08-20 NOTE — Op Note (Signed)
[place]  [address] [place], [address]   COLONOSCOPY PROCEDURE REPORT  PATIENT: Barry [name], Barry [name]  MR#: [id number] BIRTHDATE: 1933-06-25 , 82  yrs. old GENDER: male ENDOSCOPIST: Gatha Mayer, MD, [facility] PROCEDURE DATE:  08/20/2015 PROCEDURE:   Colonoscopy, surveillance and Colonoscopy with snare polypectomy First Screening Colonoscopy - Avg.  risk and is 50 yrs.  old or older - No.  Prior Negative Screening - Now for repeat screening. N/A  History of Adenoma - Now for follow-up colonoscopy & has been > or = to 3 yrs.  Yes hx of adenoma.  Has been 3 or more years since last colonoscopy.  Polyps removed today? Yes ASA CLASS:   Class III INDICATIONS:Surveillance due to prior colonic neoplasia and PH Colon Adenoma. MEDICATIONS: Propofol 150 mg IV and Monitored anesthesia care  DESCRIPTION OF PROCEDURE:   After the risks benefits and alternatives of the procedure were thoroughly explained, informed consent was obtained.  The digital rectal exam revealed no rectal mass and revealed an enlarged prostate.   The LB [id number] endoscope was introduced through the anus and advanced to the cecum, which was identified by both the appendix and ileocecal valve. No adverse events experienced.   The quality of the prep was excellent.  (MiraLax was used)  The instrument was then slowly withdrawn as the colon was fully examined. Estimated blood loss is zero unless otherwise noted in this procedure report.      COLON FINDINGS: A sessile polyp measuring 7 mm in size was found in the descending colon.  A polypectomy was performed with a cold snare.  The resection was complete, the polyp tissue was completely retrieved and sent to histology.   There was diverticulosis noted in the left colon.   The examination was otherwise normal. Retroflexed views revealed no abnormalities. The time to cecum = 1.9 Withdrawal time = 8.8   The scope was withdrawn and the procedure  completed. COMPLICATIONS: There were no immediate complications.  ENDOSCOPIC IMPRESSION: 1.   Sessile polyp was found in the descending colon; polypectomy was performed with a cold snare 2.   Diverticulosis was noted in the left colon 3.   The examination was otherwise normal - excellent prep - hx polyps  RECOMMENDATIONS: Will notify polyp pathology - I do not recommend routine repeat colonoscopy at his age.  eSigned:  Gatha Mayer, MD, [facility] 08/20/2015 12:27 PM   cc: [facility] and Dr. Vic Blackbird

## 2015-08-21 ENCOUNTER — Telehealth: Payer: Self-pay | Admitting: Emergency Medicine

## 2015-08-21 NOTE — Telephone Encounter (Signed)
  Follow up Call-  Call back number 08/20/2015  Post procedure Call Back phone  # [phone]  Permission to leave phone message Yes     Patient questions:  Do you have a fever, pain , or abdominal swelling? No. Pain Score  0 *  Have you tolerated food without any problems? Yes.    Have you been able to return to your normal activities? Yes.    Do you have any questions about your discharge instructions: Diet   No. Medications  No. Follow up visit  No.  Do you have questions or concerns about your Care? No.  Actions: * If pain score is 4 or above: No action needed, pain <4.

## 2015-08-26 ENCOUNTER — Encounter: Payer: Self-pay | Admitting: Internal Medicine

## 2015-08-26 DIAGNOSIS — Z860100 Personal history of colon polyps, unspecified: Secondary | ICD-10-CM

## 2015-08-26 HISTORY — DX: Personal history of colon polyps, unspecified: Z86.0100

## 2015-08-26 HISTORY — DX: Personal history of colonic polyps: Z86.010

## 2015-08-26 NOTE — Progress Notes (Signed)
Quick Note:  Tubular adenoma He is 82 No recall ______

## 2015-09-15 ENCOUNTER — Other Ambulatory Visit: Payer: Self-pay | Admitting: Family Medicine

## 2015-09-15 NOTE — Telephone Encounter (Signed)
Refill appropriate and filled per protocol. 

## 2015-09-22 ENCOUNTER — Encounter: Payer: Self-pay | Admitting: Family Medicine

## 2015-09-22 ENCOUNTER — Ambulatory Visit (INDEPENDENT_AMBULATORY_CARE_PROVIDER_SITE_OTHER): Payer: Medicare Other | Admitting: Family Medicine

## 2015-09-22 VITALS — BP 128/70 | HR 74 | Temp 98.3°F | Resp 18 | Ht 70.0 in | Wt 190.0 lb

## 2015-09-22 DIAGNOSIS — I25119 Atherosclerotic heart disease of native coronary artery with unspecified angina pectoris: Secondary | ICD-10-CM | POA: Diagnosis not present

## 2015-09-22 DIAGNOSIS — I2510 Atherosclerotic heart disease of native coronary artery without angina pectoris: Secondary | ICD-10-CM

## 2015-09-22 DIAGNOSIS — E119 Type 2 diabetes mellitus without complications: Secondary | ICD-10-CM | POA: Diagnosis not present

## 2015-09-22 DIAGNOSIS — I10 Essential (primary) hypertension: Secondary | ICD-10-CM

## 2015-09-22 LAB — BASIC METABOLIC PANEL
BUN: 24 mg/dL (ref 7–25)
CHLORIDE: 101 mmol/L (ref 98–110)
CO2: 31 mmol/L (ref 20–31)
CREATININE: 1.05 mg/dL (ref 0.70–1.11)
Calcium: 9 mg/dL (ref 8.6–10.3)
Glucose, Bld: 142 mg/dL — ABNORMAL HIGH (ref 70–99)
Potassium: 4.2 mmol/L (ref 3.5–5.3)
SODIUM: 135 mmol/L (ref 135–146)

## 2015-09-22 LAB — HEMOGLOBIN A1C
Hgb A1c MFr Bld: 6.8 % — ABNORMAL HIGH (ref <5.7)
Mean Plasma Glucose: 148 mg/dL — ABNORMAL HIGH (ref <117)

## 2015-09-22 NOTE — Assessment & Plan Note (Signed)
Blood pressure is well-controlled at change medication 

## 2015-09-22 NOTE — Assessment & Plan Note (Signed)
His diabetes has been well-controlled for his age. Advised him that he is okay to use a small snack before bedtime he is very fearful of his blood sugar dropping and he lives alone. We'll continue his current medications. His cholesterol is at goal

## 2015-09-22 NOTE — Progress Notes (Signed)
Patient ID: Barry [name], male   DOB: [birth year], 81 y.o.   MRN: [id number]   Subjective:    Patient ID: Barry [name], male    DOB: [birth year], 81 y.o.   MRN: [id number]  Patient presents for 4 month F/U   patient here to follow-up chronic medical problems.  Diabetes mellitus his last A1c was 7.1%. He states that bedtime his blood sugars typically about 120 he thought this was too low therefore he does snack in the morning and then runs from 1:30 to 140. He has had 1 hypoglycemic event he actually ate breakfast that morning but was very busy with some friends and states that he started feeling shaky his blood sugar was 62 after he ate his symptoms resolved.   Last visit he has had his colonoscopy. He also continues to follow-up with psychiatry for his depression and this is well controlled.   Review Of Systems:  GEN- denies fatigue, fever, weight loss,weakness, recent illness HEENT- denies eye drainage, change in vision, nasal discharge, CVS- denies chest pain, palpitations RESP- denies SOB, cough, wheeze ABD- denies N/V, change in stools, abd pain GU- denies dysuria, hematuria, dribbling, incontinence MSK- denies joint pain, muscle aches, injury Neuro- denies headache, dizziness, syncope, seizure activity       Objective:    BP 128/70 mmHg  Pulse 74  Temp(Src) 98.3 F (36.8 C) (Oral)  Resp 18  Ht 5\' 10"  (1.778 m)  Wt 190 lb (86.183 kg)  BMI 27.26 kg/m2 GEN- NAD, alert and oriented x3 HEENT- PERRL, EOMI, non injected sclera, pink conjunctiva, MMM, oropharynx clear Neck- Supple, no thyromegaly CVS- RRR, no murmur RESP-CTAB ABD-NABS,soft,NT,ND EXT- No edema, missing 5th digit, right foot Pulses- Radial, DP- 2+        Assessment & Plan:      Problem List Items Addressed This Visit    Essential hypertension - Primary    Blood pressure is well-controlled at change medication      Relevant Orders   Basic metabolic panel   Diabetes mellitus type II,  controlled ([place])    His diabetes has been well-controlled for his age. Advised him that he is okay to use a small snack before bedtime he is very fearful of his blood sugar dropping and he lives alone. We'll continue his current medications. His cholesterol is at goal      Relevant Orders   Hemoglobin A1c   CAD (coronary artery disease)    The cardiology note. He is asymptomatic at this time. He is still fairly active for his age.         Note: This dictation was prepared with Dragon dictation along with smaller phrase technology. Any transcriptional errors that result from this process are unintentional.

## 2015-09-22 NOTE — Assessment & Plan Note (Signed)
The cardiology note. He is asymptomatic at this time. He is still fairly active for his age.

## 2015-09-22 NOTE — Patient Instructions (Signed)
Continue current medications We will call with lab results  F/U 4 months  

## 2015-09-23 ENCOUNTER — Encounter: Payer: Self-pay | Admitting: *Deleted

## 2015-10-06 ENCOUNTER — Other Ambulatory Visit: Payer: Self-pay | Admitting: Family Medicine

## 2015-11-10 DIAGNOSIS — H35371 Puckering of macula, right eye: Secondary | ICD-10-CM | POA: Diagnosis not present

## 2015-11-28 ENCOUNTER — Telehealth: Payer: Self-pay | Admitting: Family Medicine

## 2015-11-28 MED ORDER — SIMVASTATIN 20 MG PO TABS: 20.0000 mg | ORAL_TABLET | Freq: Every day | ORAL | Status: DC

## 2015-11-28 NOTE — Telephone Encounter (Signed)
Pt needs a refill of simvastatin 100 mg sent to [facility].  He will be using this pharmacy from now on. [place]: [phone] FAX: [phone]

## 2015-11-28 NOTE — Telephone Encounter (Signed)
Patient is not taking Zocor 100mg .   Clarification: patient is taking Zocor 20mg .   Prescription sent to pharmacy.

## 2015-12-08 ENCOUNTER — Ambulatory Visit (INDEPENDENT_AMBULATORY_CARE_PROVIDER_SITE_OTHER): Payer: Medicare Other | Admitting: Psychiatry

## 2015-12-08 ENCOUNTER — Encounter (HOSPITAL_COMMUNITY): Payer: Self-pay | Admitting: Psychiatry

## 2015-12-08 VITALS — BP 176/85 | HR 64 | Ht 70.0 in | Wt 190.0 lb

## 2015-12-08 DIAGNOSIS — F341 Dysthymic disorder: Secondary | ICD-10-CM | POA: Diagnosis not present

## 2015-12-08 DIAGNOSIS — F5105 Insomnia due to other mental disorder: Secondary | ICD-10-CM | POA: Diagnosis not present

## 2015-12-08 DIAGNOSIS — F418 Other specified anxiety disorders: Secondary | ICD-10-CM

## 2015-12-08 DIAGNOSIS — F331 Major depressive disorder, recurrent, moderate: Secondary | ICD-10-CM

## 2015-12-08 MED ORDER — NORTRIPTYLINE HCL 10 MG PO CAPS: 10.0000 mg | ORAL_CAPSULE | Freq: Every day | ORAL | Status: DC

## 2015-12-08 MED ORDER — FLUOXETINE HCL 20 MG PO CAPS: 20.0000 mg | ORAL_CAPSULE | Freq: Every morning | ORAL | Status: DC

## 2015-12-08 NOTE — Progress Notes (Signed)
Patient ID: MASYN ROSTRO, male   DOB: [birth year], 82 y.o.   MRN: [id number] Patient ID: WENDELL NICOSON, male   DOB: 1933-10-14, 82 y.o.   MRN: [id number] Patient ID: SKYLEN SPIERING, male   DOB: [birth year], 82 y.o.   MRN: [id number] Patient ID: KREIG PARSON, male   DOB: [birth year], 82 y.o.   MRN: [id number] Patient ID: ALICIA ACKERT, male   DOB: 06-16-1933, 82 y.o.   MRN: [id number] Patient ID: RADEK CARNERO, male   DOB: [birth year], 82 y.o.   MRN: [id number] Patient ID: TARIN NAVAREZ, male   DOB: 1933-08-26, 82 y.o.   MRN: [id number] Patient ID: MARCAS BOWSHER, male   DOB: 11/27/1932, 82 y.o.   MRN: [id number] Patient ID: COSTAS SENA, male   DOB: [birth year], 82 y.o.   MRN: [id number] Patient ID: KASEN ADDUCI, male   DOB: 01/24/1933, 82 y.o.   MRN: [id number] [facility] [address] Progress Note JAMIEN CASANOVA MRN: [id number] DOB: 03-10-1933 Age: 82 y.o.  Date: 12/08/2015 Start Time: 10:15 AM End Time: 10:30 AM  Chief Complaint: Chief Complaint  Patient presents with  . Depression  . Anxiety  . Follow-up   Subjective: I'm doing well".  This patient is an 82  year-old widowed white male who lives alone in [place]. He has 2 children and 3 grandchildren who live in [place]. 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  The patient states that he developed anxiety and depression in his 4s. His depression worsened when his wife died suddenly 80 years ago. However he is doing very well on his current medications. He denies being depressed or anxious and he is sleeping quite well. He spends most of his time with her grandchildren and really enjoys it. His social life is quite active. He has no side effects or complaints regarding his medications  The patient returns after 4 months. He is doing well. He breaks a 1 mg Ativan in 2 half doses and takes 4 a day. This is controlling his anxiety. His mood is good. He is  sleeping well and enjoying time with his grandchildren. He continues to get out and be very active. He has no specific complaints today and states that his general health is very good  Current psychiatric medication Ativan 0.5 mg 4 times a day Nortriptyline 10 mg daily Prozac 20 mg daily   Vitals: BP 176/85 mmHg  Pulse 64  Ht '5\' 10"'  (1.778 m)  Wt 190 lb (86.183 kg)  BMI 27.26 kg/m2  SpO2 92%  Allergies: Allergies  Allergen Reactions  . Lisinopril-Hydrochlorothiazide Swelling    ZESTRIL  . Doxycycline Rash   Medical History: Past Medical History  Diagnosis Date  . GERD (gastroesophageal reflux disease)   . Depression   . Tobacco abuse   . Hyperlipidemia   . Diabetes mellitus   . Allergy     rhinitis  . Hepatitis   . Hypertension   . PVD (peripheral vascular disease) ([place])   . AAA (abdominal aortic aneurysm) ([facility]) 3-09    4.7 cm  . Diverticulosis   . Personal history of colonic polyps     adenomas, serrated also  . Nephrolithiasis   . Anxiety   . Cancer ([place])     skin  . Cataract   . Hx of colonic polyps 08/26/2015  Patient has history of diabetes mellitus, hypertension, peripheral vascular disease, abdominal aortic aneurysm, diverticulosis, GERD and hyperlipidemia.  He  sees Dr. Timoteo Gaul regularly and will be going back soon for blood work.   Surgical History: Past Surgical History  Procedure Laterality Date  . Surgical excision basal cell carcinoma    . Lower limb amputation, other toe 5th    . Tympanic eardrum repair    . Percutaneous stent graft repair of infrarenal aaa  11/10    5.6 cm (T. early)  . Colonoscopy  2006, 2008, 2010, 05/27/2011    numerous adenomas - 13 in 2006, 3, 2008, 2 2012 (up to 1 cm), 4 diminutive adenomas, serrated adenomas 2012. Diverticulosis and hemorrhoids also.  . Eye surgery    . Kidney stone operation    . Vasectomy    . Eye surgery Left    Family History: family history includes Cancer in his brother and brother; Depression in  his brother and sister; Diabetes in his sister; Emphysema in his father; Heart disease in his mother; Kidney disease in his sister; Learning disabilities in his sister; Lung disease in his sister; Mental retardation in his sister. There is no history of Colon cancer, Dementia, Alcohol abuse, Drug abuse, Paranoid behavior, Schizophrenia, Anxiety disorder, Bipolar disorder, OCD, Sexual abuse, Physical abuse, Seizures, Esophageal cancer, Stomach cancer, or Rectal cancer. Reviewed and the history of his sisters was clarified and the passing of [name] recorded.  Mental status examination Patient is well groomed well dressed.  He is pleasant.  He maintained good eye contact. His speech is soft clear and coherent. His thought process is logical linear and goal-directed. He denies any active or passive suicidal thoughts or homicidal thoughts.  He denies any auditory or visual hallucination.  He described his mood as good and his affect is mood congruent.  There no psychotic symptoms present. He's alert and oriented x3. His insight judgment and impulse control is okay. His memory functions speech and fund of knowledge are all good  Lab Results:  Results for orders placed or performed in visit on 09/22/15 (from the past 8736 hour(s))  Basic metabolic panel   Collection Time: 09/22/15  9:56 AM  Result Value Ref Range   Sodium 135 135 - 146 mmol/L   Potassium 4.2 3.5 - 5.3 mmol/L   Chloride 101 98 - 110 mmol/L   CO2 31 20 - 31 mmol/L   Glucose, Bld 142 (H) 70 - 99 mg/dL   BUN 24 7 - 25 mg/dL   Creat 1.05 0.70 - 1.11 mg/dL   Calcium 9.0 8.6 - 10.3 mg/dL  Hemoglobin A1c   Collection Time: 09/22/15  9:56 AM  Result Value Ref Range   Hgb A1c MFr Bld 6.8 (H) <5.7 %   Mean Plasma Glucose 148 (H) <117 mg/dL  Results for orders placed or performed in visit on 08/20/15 (from the past 8736 hour(s))  Glucose, capillary   Collection Time: 08/20/15 10:44 AM  Result Value Ref Range   Glucose-Capillary 112 (H) 65 -  99 mg/dL  Glucose, capillary   Collection Time: 08/20/15 12:46 PM  Result Value Ref Range   Glucose-Capillary 97 65 - 99 mg/dL   Comment 1 Notify RN    Comment 2 Document in Chart   Results for orders placed or performed in visit on 05/21/15 (from the past 8736 hour(s))  Microalbumin / creatinine urine ratio   Collection Time: 05/21/15  9:40 AM  Result Value Ref Range   Microalb, Ur 13.9 (H) <2.0 mg/dL   Creatinine, Urine 156.3 mg/dL   Microalb Creat Ratio 88.9 (H) 0.0 - 30.0 mg/g  Results for orders placed or performed in visit on 05/19/15 (from the past 8736 hour(s))  PSA, Medicare   Collection Time: 05/19/15  8:37 AM  Result Value Ref Range   PSA 0.32 <=4.00 ng/mL  COMPLETE METABOLIC PANEL WITH GFR   Collection Time: 05/19/15  8:37 AM  Result Value Ref Range   Sodium 137 135 - 146 mmol/L   Potassium 4.3 3.5 - 5.3 mmol/L   Chloride 102 98 - 110 mmol/L   CO2 29 20 - 31 mmol/L   Glucose, Bld 150 (H) 70 - 99 mg/dL   BUN 29 (H) 7 - 25 mg/dL   Creat 1.12 0.70 - 1.18 mg/dL   Total Bilirubin 0.4 0.2 - 1.2 mg/dL   Alkaline Phosphatase 36 (L) 40 - 115 U/L   AST 18 10 - 35 U/L   ALT 14 9 - 46 U/L   Total Protein 6.0 (L) 6.1 - 8.1 g/dL   Albumin 3.5 (L) 3.6 - 5.1 g/dL   Calcium 8.7 8.6 - 10.3 mg/dL   GFR, Est African American 72 >=60 mL/min   GFR, Est Non African American 62 >=60 mL/min  Lipid panel   Collection Time: 05/19/15  8:37 AM  Result Value Ref Range   Cholesterol 115 (L) 125 - 200 mg/dL   Triglycerides 69 <150 mg/dL   HDL 45 >=40 mg/dL   Total CHOL/HDL Ratio 2.6 <=5.0 Ratio   VLDL 14 <30 mg/dL   LDL Cholesterol 56 <130 mg/dL  Hemoglobin A1c   Collection Time: 05/19/15  8:37 AM  Result Value Ref Range   Hgb A1c MFr Bld 7.1 (H) <5.7 %   Mean Plasma Glucose 157 (H) <117 mg/dL  Results for orders placed or performed in visit on 02/05/15 (from the past 8736 hour(s))  Hemoglobin A1c   Collection Time: 02/05/15  8:17 AM  Result Value Ref Range   Hgb A1c MFr Bld 7.3  (H) <5.7 %   Mean Plasma Glucose 163 (H) <117 mg/dL  COMPLETE METABOLIC PANEL WITH GFR   Collection Time: 02/05/15  8:17 AM  Result Value Ref Range   Sodium 140 135 - 145 mEq/L   Potassium 4.1 3.5 - 5.3 mEq/L   Chloride 105 96 - 112 mEq/L   CO2 25 19 - 32 mEq/L   Glucose, Bld 131 (H) 70 - 99 mg/dL   BUN 30 (H) 6 - 23 mg/dL   Creat 1.10 0.50 - 1.35 mg/dL   Total Bilirubin 0.5 0.2 - 1.2 mg/dL   Alkaline Phosphatase 39 39 - 117 U/L   AST 16 0 - 37 U/L   ALT 13 0 - 53 U/L   Total Protein 6.1 6.0 - 8.3 g/dL   Albumin 3.6 3.5 - 5.2 g/dL   Calcium 9.1 8.4 - 10.5 mg/dL   GFR, Est African American 73 mL/min   GFR, Est Non African American 64 mL/min  TSH   Collection Time: 02/05/15  8:17 AM  Result Value Ref Range   TSH 2.080 0.350 - 4.500 uIU/mL  Lipid panel   Collection Time: 02/05/15  8:17 AM  Result Value Ref Range   Cholesterol 109 0 - 200 mg/dL   Triglycerides 90 <150 mg/dL   HDL 43 >=40 mg/dL   Total CHOL/HDL Ratio 2.5 Ratio   VLDL 18 0 - 40 mg/dL   LDL Cholesterol 48 0 - 99 mg/dL  CBC with Differential/Platelet   Collection Time: 02/05/15  8:17 AM  Result Value Ref Range   WBC 5.8 4.0 -  10.5 K/uL   RBC 4.94 4.22 - 5.81 MIL/uL   Hemoglobin 15.4 13.0 - 17.0 g/dL   HCT 46.0 39.0 - 52.0 %   MCV 93.1 78.0 - 100.0 fL   MCH 31.2 26.0 - 34.0 pg   MCHC 33.5 30.0 - 36.0 g/dL   RDW 14.3 11.5 - 15.5 %   Platelets 235 150 - 400 K/uL   MPV 10.7 8.6 - 12.4 fL   Neutrophils Relative % 51 43 - 77 %   Neutro Abs 3.0 1.7 - 7.7 K/uL   Lymphocytes Relative 35 12 - 46 %   Lymphs Abs 2.0 0.7 - 4.0 K/uL   Monocytes Relative 11 3 - 12 %   Monocytes Absolute 0.6 0.1 - 1.0 K/uL   Eosinophils Relative 2 0 - 5 %   Eosinophils Absolute 0.1 0.0 - 0.7 K/uL   Basophils Relative 1 0 - 1 %   Basophils Absolute 0.1 0.0 - 0.1 K/uL   Smear Review Criteria for review not met   PCP draws routine labs and nothing is emerging as of concern.  Assessment Axis I Depressive disorder NOS Axis II  deferred Axis III see medical history Axis IV mild to moderate Axis V 65-70  Plan/Discussion: I took his vitals.  I reviewed CC, tobacco/med/surg Hx, meds effects/ side effects, problem list, therapies and responses as well as current situation/symptoms discussed options. Continue prozac and nortriptyline for depression and lorazepam for anxiety He'll return in 4 months See orders and pt instructions for more details.  MEDICATIONS this encounter: Meds ordered this encounter  Medications  . nortriptyline (PAMELOR) 10 MG capsule    Sig: Take 1 capsule (10 mg total) by mouth at bedtime.    Dispense:  90 capsule    Refill:  3  . FLUoxetine (PROZAC) 20 MG capsule    Sig: Take 1 capsule (20 mg total) by mouth every morning.    Dispense:  90 capsule    Refill:  3    Medical Decision Making Problem Points:  Established problem, stable/improving (1), Review of last therapy session (1) and Review of psycho-social stressors (1) Data Points:  Review or order clinical lab tests (1) Review of medication regiment & side effects (2)  I certify that outpatient services furnished can reasonably be expected to improve the patient's condition.   [name], MD

## 2015-12-10 ENCOUNTER — Other Ambulatory Visit: Payer: Self-pay | Admitting: *Deleted

## 2015-12-10 MED ORDER — GLIMEPIRIDE 1 MG PO TABS: ORAL_TABLET | ORAL | Status: DC

## 2015-12-10 NOTE — Telephone Encounter (Signed)
Received fax requesting refill on Glimeperide.   Refill appropriate and filled per protocol.

## 2015-12-30 ENCOUNTER — Other Ambulatory Visit: Payer: Self-pay | Admitting: *Deleted

## 2015-12-30 MED ORDER — HYDROCHLOROTHIAZIDE 25 MG PO TABS: 25.0000 mg | ORAL_TABLET | Freq: Every day | ORAL | Status: DC

## 2015-12-30 NOTE — Telephone Encounter (Signed)
Received fax requesting refill on HCTZ,   Refill appropriate and filled per protocol. 

## 2016-01-12 DIAGNOSIS — L570 Actinic keratosis: Secondary | ICD-10-CM | POA: Diagnosis not present

## 2016-01-12 DIAGNOSIS — X32XXXD Exposure to sunlight, subsequent encounter: Secondary | ICD-10-CM | POA: Diagnosis not present

## 2016-01-12 DIAGNOSIS — C44212 Basal cell carcinoma of skin of right ear and external auricular canal: Secondary | ICD-10-CM | POA: Diagnosis not present

## 2016-01-12 DIAGNOSIS — C44319 Basal cell carcinoma of skin of other parts of face: Secondary | ICD-10-CM | POA: Diagnosis not present

## 2016-01-16 ENCOUNTER — Other Ambulatory Visit: Payer: Self-pay | Admitting: *Deleted

## 2016-01-16 ENCOUNTER — Telehealth: Payer: Self-pay | Admitting: Family Medicine

## 2016-01-16 MED ORDER — ATENOLOL 25 MG PO TABS: ORAL_TABLET | ORAL | Status: DC

## 2016-01-16 MED ORDER — RAMIPRIL 10 MG PO CAPS: ORAL_CAPSULE | ORAL | Status: DC

## 2016-01-16 NOTE — Telephone Encounter (Signed)
Received fax requesting refill on Atenolol and Ramipril.   Refill appropriate and filled per protocol.

## 2016-01-16 NOTE — Telephone Encounter (Signed)
Please see prior message.   

## 2016-01-16 NOTE — Telephone Encounter (Signed)
Patient would like two prescriptions called into [facility]  atenolol (TENORMIN) 25 MG tablet  ramipril (ALTACE) 10 MG capsule  CB# [id number]

## 2016-01-20 ENCOUNTER — Other Ambulatory Visit: Payer: Medicare Other

## 2016-01-20 ENCOUNTER — Other Ambulatory Visit: Payer: Self-pay | Admitting: Family Medicine

## 2016-01-20 DIAGNOSIS — Z79899 Other long term (current) drug therapy: Secondary | ICD-10-CM

## 2016-01-20 DIAGNOSIS — E785 Hyperlipidemia, unspecified: Secondary | ICD-10-CM | POA: Diagnosis not present

## 2016-01-20 DIAGNOSIS — E119 Type 2 diabetes mellitus without complications: Secondary | ICD-10-CM

## 2016-01-20 DIAGNOSIS — F341 Dysthymic disorder: Secondary | ICD-10-CM | POA: Diagnosis not present

## 2016-01-20 DIAGNOSIS — I10 Essential (primary) hypertension: Secondary | ICD-10-CM | POA: Diagnosis not present

## 2016-01-20 DIAGNOSIS — F418 Other specified anxiety disorders: Secondary | ICD-10-CM

## 2016-01-20 LAB — CBC WITH DIFFERENTIAL/PLATELET
Basophils Absolute: 63 cells/uL (ref 0–200)
Basophils Relative: 1 %
Eosinophils Absolute: 126 cells/uL (ref 15–500)
Eosinophils Relative: 2 %
HCT: 48.7 % (ref 38.5–50.0)
Hemoglobin: 16.5 g/dL (ref 13.0–17.0)
Lymphocytes Relative: 30 %
Lymphs Abs: 1890 cells/uL (ref 850–3900)
MCH: 31.9 pg (ref 27.0–33.0)
MCHC: 33.9 g/dL (ref 32.0–36.0)
MCV: 94.2 fL (ref 80.0–100.0)
MPV: 10.2 fL (ref 7.5–12.5)
Monocytes Absolute: 819 cells/uL (ref 200–950)
Monocytes Relative: 13 %
Neutro Abs: 3402 cells/uL (ref 1500–7800)
Neutrophils Relative %: 54 %
Platelets: 224 10*3/uL (ref 140–400)
RBC: 5.17 MIL/uL (ref 4.20–5.80)
RDW: 14.1 % (ref 11.0–15.0)
WBC: 6.3 10*3/uL (ref 3.8–10.8)

## 2016-01-20 LAB — LIPID PANEL
CHOL/HDL RATIO: 3.5 ratio (ref <=5.0)
CHOLESTEROL: 164 mg/dL (ref 125–200)
HDL: 47 mg/dL (ref >=40)
LDL Cholesterol: 100 mg/dL (ref <130)
TRIGLYCERIDES: 85 mg/dL (ref <150)
VLDL: 17 mg/dL (ref <30)

## 2016-01-20 LAB — COMPLETE METABOLIC PANEL WITH GFR
ALT: 10 U/L (ref 9–46)
AST: 16 U/L (ref 10–35)
Albumin: 3.7 g/dL (ref 3.6–5.1)
Alkaline Phosphatase: 44 U/L (ref 40–115)
BUN: 23 mg/dL (ref 7–25)
CO2: 30 mmol/L (ref 20–31)
Calcium: 8.8 mg/dL (ref 8.6–10.3)
Chloride: 102 mmol/L (ref 98–110)
Creat: 0.99 mg/dL (ref 0.70–1.11)
GFR, Est African American: 83 mL/min (ref >=60)
GFR, Est Non African American: 72 mL/min (ref >=60)
Glucose, Bld: 136 mg/dL — ABNORMAL HIGH (ref 70–99)
Potassium: 4.4 mmol/L (ref 3.5–5.3)
Sodium: 138 mmol/L (ref 135–146)
Total Bilirubin: 0.4 mg/dL (ref 0.2–1.2)
Total Protein: 6.2 g/dL (ref 6.1–8.1)

## 2016-01-20 LAB — HEMOGLOBIN A1C
Hgb A1c MFr Bld: 7 % — ABNORMAL HIGH (ref <5.7)
Mean Plasma Glucose: 154 mg/dL

## 2016-01-21 ENCOUNTER — Ambulatory Visit: Payer: Medicare Other | Admitting: Family Medicine

## 2016-01-21 ENCOUNTER — Encounter: Payer: Self-pay | Admitting: *Deleted

## 2016-01-26 ENCOUNTER — Encounter: Payer: Self-pay | Admitting: Family Medicine

## 2016-01-26 ENCOUNTER — Ambulatory Visit (INDEPENDENT_AMBULATORY_CARE_PROVIDER_SITE_OTHER): Payer: Medicare Other | Admitting: Family Medicine

## 2016-01-26 VITALS — BP 152/84 | HR 82 | Temp 97.7°F | Resp 14 | Ht 70.0 in | Wt 190.0 lb

## 2016-01-26 DIAGNOSIS — E785 Hyperlipidemia, unspecified: Secondary | ICD-10-CM

## 2016-01-26 DIAGNOSIS — E119 Type 2 diabetes mellitus without complications: Secondary | ICD-10-CM

## 2016-01-26 DIAGNOSIS — I10 Essential (primary) hypertension: Secondary | ICD-10-CM

## 2016-01-26 DIAGNOSIS — I714 Abdominal aortic aneurysm, without rupture, unspecified: Secondary | ICD-10-CM

## 2016-01-26 NOTE — Patient Instructions (Signed)
Continue current medications Your blood sugar looks good F/U 4 months

## 2016-01-27 NOTE — Progress Notes (Signed)
Patient ID: Barry [name], male   DOB: 18-Oct-1933, 82 y.o.   MRN: [id number]    Subjective:    Patient ID: Barry [name], male    DOB: 10-01-1933, 82 y.o.   MRN: [id number]  Patient presents for 4 month F/U  Patient here for follow-up on chronic medical problems. His fasting labs were reviewed at the bedside. He has diabetes mellitus he is currently on glimepiride. His A1c is at 7% previously at 6.8%. He denies any hypoglycemia symptoms states that he feels well. He did recently request a new meter.  Last visit I decreased his cholesterol medication to simvastatin 20 mg as he seemed to be overcorrected. His LDL is currently 100.  Medications reviewed. He continues to see his psychiatrist he is on fluoxetine as well as nortriptyline for sleep and lorazepam for anxiety   Review Of Systems:  GEN- denies fatigue, fever, weight loss,weakness, recent illness HEENT- denies eye drainage, change in vision, nasal discharge, CVS- denies chest pain, palpitations RESP- denies SOB, cough, wheeze ABD- denies N/V, change in stools, abd pain GU- denies dysuria, hematuria, dribbling, incontinence MSK- denies joint pain, muscle aches, injury Neuro- denies headache, dizziness, syncope, seizure activity       Objective:    BP 152/84 mmHg  Pulse 82  Temp(Src) 97.7 F (36.5 C) (Oral)  Resp 14  Ht 5\' 10"  (1.778 m)  Wt 190 lb (86.183 kg)  BMI 27.26 kg/m2 GEN- NAD, alert and oriented x3 HEENT- PERRL, EOMI, non injected sclera, pink conjunctiva, MMM, oropharynx clear Neck- Supple, no thyromegaly CVS- RRR, no murmur RESP-CTAB ABD-NABS,soft,NT,ND EXT- No edema Pulses- Radial, DP- 2+        Assessment & Plan:      Problem List Items Addressed This Visit    Hyperlipidemia   Essential hypertension   Diabetes mellitus type II, controlled (Barry [name]) - Primary      Note: This dictation was prepared with Dragon dictation along with smaller phrase technology. Any transcriptional errors that  result from this process are unintentional.

## 2016-01-28 NOTE — Assessment & Plan Note (Signed)
Blood pressure is little elevated today he seemed to get a little whitecoat hypertension when he comes in but his blood pressure did come down during the visit note changes regular medications

## 2016-01-28 NOTE — Assessment & Plan Note (Signed)
He will continue the Amaryl at the current dose his A1c is right at 7% which shows good control for his age

## 2016-01-28 NOTE — Assessment & Plan Note (Signed)
Review of his records he is due for repeat ultrasound and appointment with vascular this year.

## 2016-01-28 NOTE — Assessment & Plan Note (Signed)
His cholesterol still looks okay. His LDL is right at 100 allergies not been watching his diet. Some and continue the simvastatin 20 mg for now.

## 2016-02-02 NOTE — Progress Notes (Signed)
HPI: FU CAD. Patient underwent cardiac catheterization in 2005; he was found to have nonobstructive disease other than a 90% stenosis in a small branch of the right coronary artery. He was noted to have an 80% iliac stenosis and an abdominal aortic aneurysm. Procedure complicated by peripheral emboli requiring amputation of a digit. Patient has had stent graft of abdominal aortic aneurysm. Since last seen, the patient has dyspnea with more extreme activities but not with routine activities. It is relieved with rest. It is not associated with chest pain. There is no orthopnea, PND or pedal edema. There is no syncope or palpitations. There is no exertional chest pain.   Current Outpatient Prescriptions  Medication Sig Dispense Refill  . aspirin EC 81 MG tablet Take 81 mg by mouth every morning.    Marland Kitchen atenolol (TENORMIN) 25 MG tablet TAKE 1/2 BY MOUTH DAILY 45 tablet 1  . FLUoxetine (PROZAC) 20 MG capsule Take 1 capsule (20 mg total) by mouth every morning. 90 capsule 3  . glimepiride (AMARYL) 1 MG tablet TAKE 1 BY MOUTH TWICE DAILY 180 tablet 3  . hydrochlorothiazide (HYDRODIURIL) 25 MG tablet Take 1 tablet (25 mg total) by mouth daily. 90 tablet 0  . LORazepam (ATIVAN) 1 MG tablet Take 1 tablet (1 mg total) by mouth 3 (three) times daily. 90 tablet 3  . NIFEDICAL XL 30 MG 24 hr tablet TAKE 1 BY MOUTH DAILY 90 tablet 0  . nortriptyline (PAMELOR) 10 MG capsule Take 1 capsule (10 mg total) by mouth at bedtime. 90 capsule 3  . ramipril (ALTACE) 10 MG capsule TAKE 1 BY MOUTH DAILY 90 capsule 0  . simvastatin (ZOCOR) 20 MG tablet Take 1 tablet (20 mg total) by mouth at bedtime. 90 tablet 3   No current facility-administered medications for this visit.     Past Medical History  Diagnosis Date  . GERD (gastroesophageal reflux disease)   . Depression   . Tobacco abuse   . Hyperlipidemia   . Diabetes mellitus   . Allergy     rhinitis  . Hepatitis   . Hypertension   . PVD (peripheral  vascular disease) ([place])   . AAA (abdominal aortic aneurysm) ([facility]) 3-09    4.7 cm  . Diverticulosis   . Personal history of colonic polyps     adenomas, serrated also  . Nephrolithiasis   . Anxiety   . Cancer ([place])     skin  . Cataract   . Hx of colonic polyps 08/26/2015    Past Surgical History  Procedure Laterality Date  . Surgical excision basal cell carcinoma    . Lower limb amputation, other toe 5th    . Tympanic eardrum repair    . Percutaneous stent graft repair of infrarenal aaa  11/10    5.6 cm (T. early)  . Colonoscopy  2006, 2008, 2010, 05/27/2011    numerous adenomas - 13 in 2006, 3, 2008, 2 2012 (up to 1 cm), 4 diminutive adenomas, serrated adenomas 2012. Diverticulosis and hemorrhoids also.  . Eye surgery    . Kidney stone operation    . Vasectomy    . Eye surgery Left     Social History   Social History  . Marital Status: Widowed    Spouse Name: N/A  . Number of Children: 2  . Years of Education: N/A   Occupational History  .     Social History Main Topics  . Smoking status: Current Every Day Smoker --  1.00 packs/day for 60 years    Types: Cigarettes  . Smokeless tobacco: Never Used     Comment: smokes about 16-20 cigs a day as of 04/04/2013  . Alcohol Use: No  . Drug Use: No  . Sexual Activity:    Partners: Female    Patent examiner Protection: Surgical     Comment: vasectomy   Other Topics Concern  . 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 retired 25 years - keeping busy. Lives alone. 1- step-daughter, 1 son - '68; 3 grandchildren. does odd-jobs, yard work    Family History  Problem Relation Age of Onset  . Emphysema Father   . Cancer Brother     Lung  . Depression Brother   . Depression Sister   . Lung disease Sister     Fibrosis and died from pneumonia on 04-07-13  . Colon cancer Neg Hx   . Dementia Neg Hx   . Alcohol abuse Neg Hx   . Drug abuse Neg Hx   . Paranoid behavior Neg Hx   .  Schizophrenia Neg Hx   . Anxiety disorder Neg Hx   . Bipolar disorder Neg Hx   . OCD Neg Hx   . Sexual abuse Neg Hx   . Physical abuse Neg Hx   . Seizures Neg Hx   . Esophageal cancer Neg Hx   . Stomach cancer Neg Hx   . Rectal cancer Neg Hx   . Kidney disease Sister   . Diabetes Sister   . Learning disabilities Sister   . Mental retardation Sister   . Heart disease Mother   . Cancer Brother     ROS: no fevers or chills, productive cough, hemoptysis, dysphasia, odynophagia, melena, hematochezia, dysuria, hematuria, rash, seizure activity, orthopnea, PND, pedal edema, claudication. Remaining systems are negative.  Physical Exam: Well-developed well-nourished in no acute distress.  Skin is warm and dry.  HEENT is normal.  Neck is supple.  Chest is clear to auscultation with normal expansion.  Cardiovascular exam is regular rate and rhythm.  Abdominal exam nontender or distended. No masses palpated. Extremities show no edema. neuro grossly intact  ECG Normal sinus rhythm at a rate of 67. Normal axis. Cannot rule out prior anterior infarct.

## 2016-02-03 ENCOUNTER — Encounter: Payer: Self-pay | Admitting: Cardiology

## 2016-02-03 ENCOUNTER — Ambulatory Visit (INDEPENDENT_AMBULATORY_CARE_PROVIDER_SITE_OTHER): Payer: Medicare Other | Admitting: Cardiology

## 2016-02-03 VITALS — BP 144/80 | HR 67 | Ht 70.0 in | Wt 189.0 lb

## 2016-02-03 DIAGNOSIS — I2510 Atherosclerotic heart disease of native coronary artery without angina pectoris: Secondary | ICD-10-CM | POA: Diagnosis not present

## 2016-02-03 DIAGNOSIS — I2583 Coronary atherosclerosis due to lipid rich plaque: Secondary | ICD-10-CM

## 2016-02-03 DIAGNOSIS — F17200 Nicotine dependence, unspecified, uncomplicated: Secondary | ICD-10-CM

## 2016-02-03 DIAGNOSIS — I10 Essential (primary) hypertension: Secondary | ICD-10-CM | POA: Diagnosis not present

## 2016-02-03 DIAGNOSIS — E785 Hyperlipidemia, unspecified: Secondary | ICD-10-CM

## 2016-02-03 NOTE — Assessment & Plan Note (Signed)
Status post repair. Followed by vascular surgery. 

## 2016-02-03 NOTE — Assessment & Plan Note (Signed)
Patient counseled on discontinuing. 

## 2016-02-03 NOTE — Assessment & Plan Note (Signed)
Continue statin. Lipids and liver monitored by primary care. 

## 2016-02-03 NOTE — Assessment & Plan Note (Signed)
Patient follows blood pressure closely at home. Continue present medications. He states typically runs 130/80.

## 2016-02-03 NOTE — Patient Instructions (Signed)
Your physician wants you to follow-up in: ONE YEAR WITH DR CRENSHAW You will receive a reminder letter in the mail two months in advance. If you don't receive a letter, please call our office to schedule the follow-up appointment.   If you need a refill on your cardiac medications before your next appointment, please call your pharmacy.  

## 2016-02-03 NOTE — Assessment & Plan Note (Signed)
Continue aspirin and statin. 

## 2016-02-09 ENCOUNTER — Other Ambulatory Visit: Payer: Self-pay | Admitting: *Deleted

## 2016-02-09 DIAGNOSIS — Z85828 Personal history of other malignant neoplasm of skin: Secondary | ICD-10-CM | POA: Diagnosis not present

## 2016-02-09 DIAGNOSIS — L570 Actinic keratosis: Secondary | ICD-10-CM | POA: Diagnosis not present

## 2016-02-09 DIAGNOSIS — X32XXXD Exposure to sunlight, subsequent encounter: Secondary | ICD-10-CM | POA: Diagnosis not present

## 2016-02-09 DIAGNOSIS — Z08 Encounter for follow-up examination after completed treatment for malignant neoplasm: Secondary | ICD-10-CM | POA: Diagnosis not present

## 2016-02-09 MED ORDER — NIFEDIPINE ER OSMOTIC RELEASE 30 MG PO TB24: ORAL_TABLET | ORAL | Status: DC

## 2016-02-09 NOTE — Telephone Encounter (Signed)
Received fax requesting refill on Nifedipine.   Refill appropriate and filled per protocol.  

## 2016-03-03 ENCOUNTER — Other Ambulatory Visit (HOSPITAL_COMMUNITY): Payer: Self-pay | Admitting: Psychiatry

## 2016-03-03 ENCOUNTER — Telehealth (HOSPITAL_COMMUNITY): Payer: Self-pay | Admitting: *Deleted

## 2016-03-03 DIAGNOSIS — H43811 Vitreous degeneration, right eye: Secondary | ICD-10-CM | POA: Diagnosis not present

## 2016-03-03 DIAGNOSIS — H35371 Puckering of macula, right eye: Secondary | ICD-10-CM | POA: Diagnosis not present

## 2016-03-03 DIAGNOSIS — H3581 Retinal edema: Secondary | ICD-10-CM | POA: Diagnosis not present

## 2016-03-03 MED ORDER — LORAZEPAM 1 MG PO TABS: 1.0000 mg | ORAL_TABLET | Freq: Three times a day (TID) | ORAL | Status: DC

## 2016-03-03 NOTE — Telephone Encounter (Signed)
You may send in one month supply

## 2016-03-03 NOTE — Telephone Encounter (Signed)
Per Dr. Harrington Challenger to call in one month worth of medication. Called pharmacy and spoke with Nicki Reaper and he showed understanding

## 2016-03-03 NOTE — Telephone Encounter (Signed)
Pt pharmacy requesting refills for pt Ativan via e-scribe. Pt medication last filled 08-07-2015 with 3 refills. Pt was las seen 12-08-2015 and was instructed to return 04-06-2016.

## 2016-03-03 NOTE — Telephone Encounter (Signed)
Called in pt refill to pharmacy. Spoke with Nicki Reaper at pt pharmacy

## 2016-03-26 ENCOUNTER — Other Ambulatory Visit: Payer: Self-pay

## 2016-03-26 MED ORDER — HYDROCHLOROTHIAZIDE 25 MG PO TABS: 25.0000 mg | ORAL_TABLET | Freq: Every day | ORAL | Status: DC

## 2016-04-06 ENCOUNTER — Telehealth (HOSPITAL_COMMUNITY): Payer: Self-pay | Admitting: *Deleted

## 2016-04-06 ENCOUNTER — Ambulatory Visit (INDEPENDENT_AMBULATORY_CARE_PROVIDER_SITE_OTHER): Payer: Medicare Other | Admitting: Psychiatry

## 2016-04-06 ENCOUNTER — Encounter (HOSPITAL_COMMUNITY): Payer: Self-pay | Admitting: Psychiatry

## 2016-04-06 VITALS — BP 150/86 | HR 63 | Ht 70.0 in | Wt 189.2 lb

## 2016-04-06 DIAGNOSIS — F331 Major depressive disorder, recurrent, moderate: Secondary | ICD-10-CM

## 2016-04-06 DIAGNOSIS — F418 Other specified anxiety disorders: Secondary | ICD-10-CM

## 2016-04-06 DIAGNOSIS — I2510 Atherosclerotic heart disease of native coronary artery without angina pectoris: Secondary | ICD-10-CM

## 2016-04-06 DIAGNOSIS — F341 Dysthymic disorder: Secondary | ICD-10-CM | POA: Diagnosis not present

## 2016-04-06 DIAGNOSIS — F5105 Insomnia due to other mental disorder: Secondary | ICD-10-CM

## 2016-04-06 MED ORDER — NORTRIPTYLINE HCL 10 MG PO CAPS: 10.0000 mg | ORAL_CAPSULE | Freq: Every day | ORAL | Status: DC

## 2016-04-06 MED ORDER — FLUOXETINE HCL 20 MG PO CAPS: 20.0000 mg | ORAL_CAPSULE | Freq: Every morning | ORAL | Status: DC

## 2016-04-06 MED ORDER — LORAZEPAM 1 MG PO TABS: 1.0000 mg | ORAL_TABLET | Freq: Three times a day (TID) | ORAL | Status: DC

## 2016-04-06 NOTE — Progress Notes (Signed)
Patient ID: Barry [name], male   DOB: 10-09-1933, 82 y.o.   MRN: [id number] Patient ID: Barry [name], male   DOB: August 04, 1933, 82 y.o.   MRN: [id number] Patient ID: Barry [name], male   DOB: [birth year], 82 y.o.   MRN: [id number] Patient ID: Barry [name], male   DOB: 1933/05/21, 82 y.o.   MRN: [id number] Patient ID: Barry [name], male   DOB: 1933-03-07, 82 y.o.   MRN: [id number] Patient ID: Barry [name], male   DOB: 1932/12/29, 82 y.o.   MRN: [id number] Patient ID: Barry [name], male   DOB: 1932/11/04, 82 y.o.   MRN: [id number] Patient ID: Barry [name], male   DOB: 08-Jul-1933, 82 y.o.   MRN: [id number] Patient ID: Barry [name], male   DOB: 19-Feb-1933, 82 y.o.   MRN: [id number] Patient ID: Barry [name], male   DOB: 1933/03/18, 82 y.o.   MRN: [id number] Patient ID: Barry [name], male   DOB: Jan 19, 1933, 82 y.o.   MRN: [id number] [facility] [id number] Progress Note Barry [name] MRN: [id number] DOB: Feb 24, 1933 Age: 82 y.o.  Date: 04/06/2016 Start Time: 10:15 AM End Time: 10:30 AM  Chief Complaint: Chief Complaint  Patient presents with  . Depression  . Anxiety  . Follow-up   Subjective: I'm doing well".  This patient is an 82  year-old widowed white male who lives alone in [place]. He has 2 children and 3 grandchildren who live in [place]. 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  The patient states that he developed anxiety and depression in his 37s. His depression worsened when his wife died suddenly 69 years ago. However he is doing very well on his current medications. He denies being depressed or anxious and he is sleeping quite well. He spends most of his time with her grandchildren and really enjoys it. His social life is quite active. He has no side effects or complaints regarding his medications  The patient returns after 4 months. He is doing well. He breaks a 1 mg Ativan in 2 half doses  and takes 4 a day. This is controlling his anxiety. His mood is good. He is sleeping well and enjoying time with his grandchildren. He continues to get out and be very active. He has no specific complaints today and states that his general health is very good. He is mowing several lawns with his writing more  Current psychiatric medication Ativan 0.5 mg 4 times a day Nortriptyline 10 mg daily Prozac 20 mg daily   Vitals: BP 150/86 mmHg  Pulse 63  Ht '5\' 10"'  (1.778 m)  Wt 189 lb 3.2 oz (85.821 kg)  BMI 27.15 kg/m2  SpO2 92%  Allergies: Allergies  Allergen Reactions  . Lisinopril-Hydrochlorothiazide Swelling    ZESTRIL  . Doxycycline Rash   Medical History: Past Medical History  Diagnosis Date  . GERD (gastroesophageal reflux disease)   . Depression   . Tobacco abuse   . Hyperlipidemia   . Diabetes mellitus   . Allergy     rhinitis  . Hepatitis   . Hypertension   . PVD (peripheral vascular disease) ([place])   . AAA (abdominal aortic aneurysm) (HCC) 3-09    4.7 cm  . Diverticulosis   . Personal history of colonic polyps     adenomas, serrated also  . Nephrolithiasis   . Anxiety   . Cancer ([place])     skin  . Cataract   .  Hx of colonic polyps 08/26/2015  Patient has history of diabetes mellitus, hypertension, peripheral vascular disease, abdominal aortic aneurysm, diverticulosis, GERD and hyperlipidemia.  He sees Dr. Timoteo Gaul regularly and will be going back soon for blood work.   Surgical History: Past Surgical History  Procedure Laterality Date  . Surgical excision basal cell carcinoma    . Lower limb amputation, other toe 5th    . Tympanic eardrum repair    . Percutaneous stent graft repair of infrarenal aaa  11/10    5.6 cm (T. early)  . Colonoscopy  2006, 2008, 2010, 05/27/2011    numerous adenomas - 13 in 2006, 3, 2008, 2 2012 (up to 1 cm), 4 diminutive adenomas, serrated adenomas 2012. Diverticulosis and hemorrhoids also.  . Eye surgery    . Kidney stone operation     . Vasectomy    . Eye surgery Left    Family History: family history includes Cancer in his brother and brother; Depression in his brother and sister; Diabetes in his sister; Emphysema in his father; Heart disease in his mother; Kidney disease in his sister; Learning disabilities in his sister; Lung disease in his sister; Mental retardation in his sister. There is no history of Colon cancer, Dementia, Alcohol abuse, Drug abuse, Paranoid behavior, Schizophrenia, Anxiety disorder, Bipolar disorder, OCD, Sexual abuse, Physical abuse, Seizures, Esophageal cancer, Stomach cancer, or Rectal cancer. Reviewed and the history of his sisters was clarified and the passing of [name] recorded.  Mental status examination Patient is well groomed well dressed.  He is pleasant.  He maintained good eye contact. His speech is soft clear and coherent. His thought process is logical linear and goal-directed. He denies any active or passive suicidal thoughts or homicidal thoughts.  He denies any auditory or visual hallucination.  He described his mood as good and his affect is mood congruent.  There no psychotic symptoms present. He's alert and oriented x3. His insight judgment and impulse control is okay. His memory functions speech and fund of knowledge are all good  Lab Results:  Results for orders placed or performed in visit on 01/20/16 (from the past 8736 hour(s))  COMPLETE METABOLIC PANEL WITH GFR   Collection Time: 01/20/16  8:16 AM  Result Value Ref Range   Sodium 138 135 - 146 mmol/L   Potassium 4.4 3.5 - 5.3 mmol/L   Chloride 102 98 - 110 mmol/L   CO2 30 20 - 31 mmol/L   Glucose, Bld 136 (H) 70 - 99 mg/dL   BUN 23 7 - 25 mg/dL   Creat 0.99 0.70 - 1.11 mg/dL   Total Bilirubin 0.4 0.2 - 1.2 mg/dL   Alkaline Phosphatase 44 40 - 115 U/L   AST 16 10 - 35 U/L   ALT 10 9 - 46 U/L   Total Protein 6.2 6.1 - 8.1 g/dL   Albumin 3.7 3.6 - 5.1 g/dL   Calcium 8.8 8.6 - 10.3 mg/dL   GFR, Est African American 83  >=60 mL/min   GFR, Est Non African American 72 >=60 mL/min  Lipid panel   Collection Time: 01/20/16  8:16 AM  Result Value Ref Range   Cholesterol 164 125 - 200 mg/dL   Triglycerides 85 <150 mg/dL   HDL 47 >=40 mg/dL   Total CHOL/HDL Ratio 3.5 <=5.0 Ratio   VLDL 17 <30 mg/dL   LDL Cholesterol 100 <130 mg/dL  CBC with Differential/Platelet   Collection Time: 01/20/16  8:16 AM  Result Value Ref Range  WBC 6.3 3.8 - 10.8 K/uL   RBC 5.17 4.20 - 5.80 MIL/uL   Hemoglobin 16.5 13.0 - 17.0 g/dL   HCT 48.7 38.5 - 50.0 %   MCV 94.2 80.0 - 100.0 fL   MCH 31.9 27.0 - 33.0 pg   MCHC 33.9 32.0 - 36.0 g/dL   RDW 14.1 11.0 - 15.0 %   Platelets 224 140 - 400 K/uL   MPV 10.2 7.5 - 12.5 fL   Neutro Abs 3402 1500 - 7800 cells/uL   Lymphs Abs 1890 850 - 3900 cells/uL   Monocytes Absolute 819 200 - 950 cells/uL   Eosinophils Absolute 126 15 - 500 cells/uL   Basophils Absolute 63 0 - 200 cells/uL   Neutrophils Relative % 54 %   Lymphocytes Relative 30 %   Monocytes Relative 13 %   Eosinophils Relative 2 %   Basophils Relative 1 %   Smear Review Criteria for review not met   Hemoglobin A1c   Collection Time: 01/20/16  8:16 AM  Result Value Ref Range   Hgb A1c MFr Bld 7.0 (H) <5.7 %   Mean Plasma Glucose 154 mg/dL  Results for orders placed or performed in visit on 09/22/15 (from the past 8736 hour(s))  Basic metabolic panel   Collection Time: 09/22/15  9:56 AM  Result Value Ref Range   Sodium 135 135 - 146 mmol/L   Potassium 4.2 3.5 - 5.3 mmol/L   Chloride 101 98 - 110 mmol/L   CO2 31 20 - 31 mmol/L   Glucose, Bld 142 (H) 70 - 99 mg/dL   BUN 24 7 - 25 mg/dL   Creat 1.05 0.70 - 1.11 mg/dL   Calcium 9.0 8.6 - 10.3 mg/dL  Hemoglobin A1c   Collection Time: 09/22/15  9:56 AM  Result Value Ref Range   Hgb A1c MFr Bld 6.8 (H) <5.7 %   Mean Plasma Glucose 148 (H) <117 mg/dL  Results for orders placed or performed in visit on 08/20/15 (from the past 8736 hour(s))  Glucose, capillary    Collection Time: 08/20/15 10:44 AM  Result Value Ref Range   Glucose-Capillary 112 (H) 65 - 99 mg/dL  Glucose, capillary   Collection Time: 08/20/15 12:46 PM  Result Value Ref Range   Glucose-Capillary 97 65 - 99 mg/dL   Comment 1 Notify RN    Comment 2 Document in Chart   Results for orders placed or performed in visit on 05/21/15 (from the past 8736 hour(s))  Microalbumin / creatinine urine ratio   Collection Time: 05/21/15  9:40 AM  Result Value Ref Range   Microalb, Ur 13.9 (H) <2.0 mg/dL   Creatinine, Urine 156.3 mg/dL   Microalb Creat Ratio 88.9 (H) 0.0 - 30.0 mg/g  Results for orders placed or performed in visit on 05/19/15 (from the past 8736 hour(s))  PSA, Medicare   Collection Time: 05/19/15  8:37 AM  Result Value Ref Range   PSA 0.32 <=4.00 ng/mL  COMPLETE METABOLIC PANEL WITH GFR   Collection Time: 05/19/15  8:37 AM  Result Value Ref Range   Sodium 137 135 - 146 mmol/L   Potassium 4.3 3.5 - 5.3 mmol/L   Chloride 102 98 - 110 mmol/L   CO2 29 20 - 31 mmol/L   Glucose, Bld 150 (H) 70 - 99 mg/dL   BUN 29 (H) 7 - 25 mg/dL   Creat 1.12 0.70 - 1.18 mg/dL   Total Bilirubin 0.4 0.2 - 1.2 mg/dL   Alkaline Phosphatase 36 (  L) 40 - 115 U/L   AST 18 10 - 35 U/L   ALT 14 9 - 46 U/L   Total Protein 6.0 (L) 6.1 - 8.1 g/dL   Albumin 3.5 (L) 3.6 - 5.1 g/dL   Calcium 8.7 8.6 - 10.3 mg/dL   GFR, Est African American 72 >=60 mL/min   GFR, Est Non African American 62 >=60 mL/min  Lipid panel   Collection Time: 05/19/15  8:37 AM  Result Value Ref Range   Cholesterol 115 (L) 125 - 200 mg/dL   Triglycerides 69 <150 mg/dL   HDL 45 >=40 mg/dL   Total CHOL/HDL Ratio 2.6 <=5.0 Ratio   VLDL 14 <30 mg/dL   LDL Cholesterol 56 <130 mg/dL  Hemoglobin A1c   Collection Time: 05/19/15  8:37 AM  Result Value Ref Range   Hgb A1c MFr Bld 7.1 (H) <5.7 %   Mean Plasma Glucose 157 (H) <117 mg/dL  PCP draws routine labs and nothing is emerging as of concern.  Assessment Axis I Depressive  disorder NOS Axis II deferred Axis III see medical history Axis IV mild to moderate Axis V 65-70  Plan/Discussion: I took his vitals.  I reviewed CC, tobacco/med/surg Hx, meds effects/ side effects, problem list, therapies and responses as well as current situation/symptoms discussed options. Continue prozac and nortriptyline for depression and lorazepam for anxiety He'll return in 4 months See orders and pt instructions for more details.  MEDICATIONS this encounter: Meds ordered this encounter  Medications  . FLUoxetine (PROZAC) 20 MG capsule    Sig: Take 1 capsule (20 mg total) by mouth every morning.    Dispense:  90 capsule    Refill:  3  . nortriptyline (PAMELOR) 10 MG capsule    Sig: Take 1 capsule (10 mg total) by mouth at bedtime.    Dispense:  90 capsule    Refill:  3  . LORazepam (ATIVAN) 1 MG tablet    Sig: Take 1 tablet (1 mg total) by mouth 3 (three) times daily.    Dispense:  90 tablet    Refill:  3    Medical Decision Making Problem Points:  Established problem, stable/improving (1), Review of last therapy session (1) and Review of psycho-social stressors (1) Data Points:  Review or order clinical lab tests (1) Review of medication regiment & side effects (2)  I certify that outpatient services furnished can reasonably be expected to improve the patient's condition.   [name], MD

## 2016-04-06 NOTE — Telephone Encounter (Signed)
Error

## 2016-04-07 ENCOUNTER — Other Ambulatory Visit: Payer: Self-pay | Admitting: *Deleted

## 2016-04-07 MED ORDER — RAMIPRIL 10 MG PO CAPS: ORAL_CAPSULE | ORAL | Status: DC

## 2016-04-07 NOTE — Telephone Encounter (Signed)
Received fax requesting refill on ramipril.   Refill appropriate and filled per protocol.

## 2016-04-30 ENCOUNTER — Other Ambulatory Visit: Payer: Self-pay | Admitting: *Deleted

## 2016-04-30 MED ORDER — NIFEDIPINE ER OSMOTIC RELEASE 30 MG PO TB24: ORAL_TABLET | ORAL | Status: DC

## 2016-04-30 NOTE — Telephone Encounter (Signed)
Received fax requesting refill on Nifedipine.   Refill appropriate and filled per protocol.

## 2016-05-14 ENCOUNTER — Other Ambulatory Visit: Payer: Self-pay | Admitting: *Deleted

## 2016-05-14 MED ORDER — NIFEDIPINE ER OSMOTIC RELEASE 30 MG PO TB24: ORAL_TABLET | ORAL | 0 refills | Status: DC

## 2016-05-18 ENCOUNTER — Ambulatory Visit: Payer: Self-pay | Admitting: Vascular Surgery

## 2016-05-18 ENCOUNTER — Other Ambulatory Visit (HOSPITAL_COMMUNITY): Payer: Self-pay

## 2016-05-25 ENCOUNTER — Telehealth: Payer: Self-pay | Admitting: *Deleted

## 2016-05-25 NOTE — Telephone Encounter (Signed)
Received call from patient.   Reports that he requires refill on DM supplies from [facility] ([phone]~ telephone/ [phone]~ fax).   States that current order cannot be filled since MD is no longer enrolled in [employer].   Call placed to [facility] and requested new order sheet to be faxed to office for fellow MD to sign.

## 2016-05-27 ENCOUNTER — Other Ambulatory Visit: Payer: Medicare Other

## 2016-05-27 DIAGNOSIS — Z125 Encounter for screening for malignant neoplasm of prostate: Secondary | ICD-10-CM | POA: Diagnosis not present

## 2016-05-27 DIAGNOSIS — E785 Hyperlipidemia, unspecified: Secondary | ICD-10-CM | POA: Diagnosis not present

## 2016-05-27 DIAGNOSIS — I10 Essential (primary) hypertension: Secondary | ICD-10-CM

## 2016-05-27 DIAGNOSIS — E119 Type 2 diabetes mellitus without complications: Secondary | ICD-10-CM

## 2016-05-27 DIAGNOSIS — Z0000 Encounter for general adult medical examination without abnormal findings: Secondary | ICD-10-CM | POA: Diagnosis not present

## 2016-05-27 LAB — COMPLETE METABOLIC PANEL WITH GFR
ALBUMIN: 4.1 g/dL (ref 3.6–5.1)
ALK PHOS: 46 U/L (ref 40–115)
ALT: 14 U/L (ref 9–46)
AST: 17 U/L (ref 10–35)
BILIRUBIN TOTAL: 0.4 mg/dL (ref 0.2–1.2)
BUN: 20 mg/dL (ref 7–25)
CO2: 29 mmol/L (ref 20–31)
CREATININE: 1.06 mg/dL (ref 0.70–1.11)
Calcium: 9.5 mg/dL (ref 8.6–10.3)
Chloride: 102 mmol/L (ref 98–110)
GFR, EST NON AFRICAN AMERICAN: 66 mL/min (ref >=60)
GFR, Est African American: 76 mL/min (ref >=60)
GLUCOSE: 138 mg/dL — AB (ref 70–99)
Potassium: 4.7 mmol/L (ref 3.5–5.3)
SODIUM: 141 mmol/L (ref 135–146)
TOTAL PROTEIN: 6.8 g/dL (ref 6.1–8.1)

## 2016-05-27 LAB — CBC WITH DIFFERENTIAL/PLATELET
BASOS ABS: 71 {cells}/uL (ref 0–200)
BASOS PCT: 1 %
EOS PCT: 1 %
Eosinophils Absolute: 71 cells/uL (ref 15–500)
HCT: 49.4 % (ref 38.5–50.0)
HEMOGLOBIN: 16.7 g/dL (ref 13.0–17.0)
LYMPHS ABS: 1846 {cells}/uL (ref 850–3900)
Lymphocytes Relative: 26 %
MCH: 32 pg (ref 27.0–33.0)
MCHC: 33.8 g/dL (ref 32.0–36.0)
MCV: 94.6 fL (ref 80.0–100.0)
MONOS PCT: 11 %
MPV: 10.1 fL (ref 7.5–12.5)
Monocytes Absolute: 781 cells/uL (ref 200–950)
NEUTROS ABS: 4331 {cells}/uL (ref 1500–7800)
Neutrophils Relative %: 61 %
PLATELETS: 253 10*3/uL (ref 140–400)
RBC: 5.22 MIL/uL (ref 4.20–5.80)
RDW: 14.3 % (ref 11.0–15.0)
WBC: 7.1 10*3/uL (ref 3.8–10.8)

## 2016-05-27 LAB — LIPID PANEL
Cholesterol: 210 mg/dL — ABNORMAL HIGH (ref 125–200)
HDL: 50 mg/dL (ref >=40)
LDL CALC: 134 mg/dL — AB (ref <130)
Total CHOL/HDL Ratio: 4.2 Ratio (ref <=5.0)
Triglycerides: 132 mg/dL (ref <150)
VLDL: 26 mg/dL (ref <30)

## 2016-05-27 LAB — PSA: PSA: 0.3 ng/mL (ref <=4.0)

## 2016-05-28 ENCOUNTER — Ambulatory Visit (INDEPENDENT_AMBULATORY_CARE_PROVIDER_SITE_OTHER): Payer: Medicare Other | Admitting: Family Medicine

## 2016-05-28 ENCOUNTER — Encounter: Payer: Self-pay | Admitting: Family Medicine

## 2016-05-28 VITALS — BP 150/72 | HR 73 | Temp 99.2°F | Resp 16 | Ht 70.0 in | Wt 189.0 lb

## 2016-05-28 DIAGNOSIS — I2510 Atherosclerotic heart disease of native coronary artery without angina pectoris: Secondary | ICD-10-CM | POA: Diagnosis not present

## 2016-05-28 DIAGNOSIS — I10 Essential (primary) hypertension: Secondary | ICD-10-CM

## 2016-05-28 DIAGNOSIS — N528 Other male erectile dysfunction: Secondary | ICD-10-CM | POA: Diagnosis not present

## 2016-05-28 DIAGNOSIS — E785 Hyperlipidemia, unspecified: Secondary | ICD-10-CM | POA: Diagnosis not present

## 2016-05-28 DIAGNOSIS — E119 Type 2 diabetes mellitus without complications: Secondary | ICD-10-CM

## 2016-05-28 LAB — MICROALBUMIN / CREATININE URINE RATIO
Creatinine, Urine: 190 mg/dL (ref 20–370)
Microalb Creat Ratio: 95 mcg/mg creat — ABNORMAL HIGH (ref <30)
Microalb, Ur: 18.1 mg/dL

## 2016-05-28 LAB — HEMOGLOBIN A1C
HEMOGLOBIN A1C: 6.5 % — AB (ref <5.7)
MEAN PLASMA GLUCOSE: 140 mg/dL

## 2016-05-28 MED ORDER — SIMVASTATIN 40 MG PO TABS: 40.0000 mg | ORAL_TABLET | Freq: Every day | ORAL | 3 refills | Status: DC

## 2016-05-28 MED ORDER — SILDENAFIL CITRATE 50 MG PO TABS: 50.0000 mg | ORAL_TABLET | Freq: Every day | ORAL | 0 refills | Status: DC | PRN

## 2016-05-28 NOTE — Patient Instructions (Signed)
F/U 4 MONTHS  Increase the cholesterol to 40mg - simvastatin

## 2016-05-28 NOTE — Progress Notes (Signed)
   Subjective:    Patient ID: Barry [name], male    DOB: [birth year], 82 y.o.   MRN: [id number]  Patient presents for Follow-up (reports no problems) Issue here to follow-up chronic medical problems. His fasting labs to review. Diabetes mellitus his A1c is much improved down to 6.5%. Fasting CBG 120 or less  He has no concerns today  Asked for sample of viagra  Continues to follow with is ccardiologist   Cholesterol has gone up with just simvastatin but he does not tolerate crestor or lipitor   Review Of Systems:  GEN- denies fatigue, fever, weight loss,weakness, recent illness HEENT- denies eye drainage, change in vision, nasal discharge, CVS- denies chest pain, palpitations RESP- denies SOB, cough, wheeze ABD- denies N/V, change in stools, abd pain GU- denies dysuria, hematuria, dribbling, incontinence MSK- denies joint pain, muscle aches, injury Neuro- denies headache, dizziness, syncope, seizure activity       Objective:    BP (!) 150/72   Pulse 73   Temp 99.2 F (37.3 C) (Oral)   Resp 16   Ht 5\' 10"  (1.778 m)   Wt 189 lb (85.7 kg)   SpO2 97%   BMI 27.12 kg/m  GEN- NAD, alert and oriented x3 HEENT- PERRL, EOMI, non injected sclera, pink conjunctiva, MMM, oropharynx clear Neck- Supple, no thyromegaly CVS- RRR, no murmur RESP-CTAB ABD-NABS,soft,NT,ND EXT- No edema Pulses- Radial, DP- 2+        Assessment & Plan:      Problem List Items Addressed This Visit    Hyperlipidemia - Primary    Increase zocor to 40mg   Risk outweight benefits, he is on calcium channel blocker       Relevant Medications   simvastatin (ZOCOR) 40 MG tablet   sildenafil (VIAGRA) 50 MG tablet   Essential hypertension    Well controlled typically, he does get a component of white coat HTN,       Relevant Medications   simvastatin (ZOCOR) 40 MG tablet   sildenafil (VIAGRA) 50 MG tablet   Erectile dysfunction    Given 2 tablets of viagra 50mg  sample       Diabetes  mellitus type II, controlled ([place])    Well controlled, no changes       Relevant Medications   simvastatin (ZOCOR) 40 MG tablet    Other Visit Diagnoses   None.     Note: This dictation was prepared with Dragon dictation along with smaller phrase technology. Any transcriptional errors that result from this process are unintentional.

## 2016-05-30 NOTE — Assessment & Plan Note (Signed)
Increase zocor to 40mg   Risk outweight benefits, he is on calcium channel blocker

## 2016-05-30 NOTE — Assessment & Plan Note (Signed)
Well controlled, no changes 

## 2016-05-30 NOTE — Assessment & Plan Note (Signed)
Well controlled typically, he does get a component of white coat HTN,

## 2016-05-30 NOTE — Assessment & Plan Note (Signed)
Given 2 tablets of viagra 50mg  sample

## 2016-06-16 ENCOUNTER — Other Ambulatory Visit: Payer: Self-pay

## 2016-06-25 ENCOUNTER — Other Ambulatory Visit: Payer: Self-pay | Admitting: *Deleted

## 2016-06-25 MED ORDER — HYDROCHLOROTHIAZIDE 25 MG PO TABS
25.0000 mg | ORAL_TABLET | Freq: Every day | ORAL | 0 refills | Status: DC
Start: 2016-06-25 — End: 2016-09-16

## 2016-06-25 NOTE — Telephone Encounter (Signed)
Received fax requesting refill on HCTZ.   Refill appropriate and filled per protocol. 

## 2016-07-02 DIAGNOSIS — J069 Acute upper respiratory infection, unspecified: Secondary | ICD-10-CM | POA: Diagnosis not present

## 2016-07-02 DIAGNOSIS — J329 Chronic sinusitis, unspecified: Secondary | ICD-10-CM | POA: Diagnosis not present

## 2016-07-04 ENCOUNTER — Other Ambulatory Visit: Payer: Self-pay | Admitting: Family Medicine

## 2016-07-06 DIAGNOSIS — S29019A Strain of muscle and tendon of unspecified wall of thorax, initial encounter: Secondary | ICD-10-CM | POA: Diagnosis not present

## 2016-07-09 ENCOUNTER — Telehealth: Payer: Self-pay | Admitting: *Deleted

## 2016-07-09 MED ORDER — METOPROLOL SUCCINATE ER 25 MG PO TB24: 12.5000 mg | ORAL_TABLET | Freq: Every day | ORAL | 3 refills | Status: DC

## 2016-07-09 MED ORDER — LEVOCETIRIZINE DIHYDROCHLORIDE 5 MG PO TABS: 5.0000 mg | ORAL_TABLET | Freq: Every evening | ORAL | 3 refills | Status: DC

## 2016-07-09 NOTE — Telephone Encounter (Signed)
Change to metoprolol succinate 12.5 mg daily

## 2016-07-09 NOTE — Telephone Encounter (Signed)
Prescription sent to pharmacy.

## 2016-07-09 NOTE — Telephone Encounter (Signed)
Received call from pharmacy.   Was advised that Atenolol is on national backorder and pharmacy is out of all dosages.   MD please advise.

## 2016-07-09 NOTE — Telephone Encounter (Signed)
Call placed to patient and patient made aware.  

## 2016-07-12 ENCOUNTER — Ambulatory Visit (INDEPENDENT_AMBULATORY_CARE_PROVIDER_SITE_OTHER): Payer: Medicare Other | Admitting: Family Medicine

## 2016-07-12 ENCOUNTER — Encounter: Payer: Self-pay | Admitting: Family Medicine

## 2016-07-12 VITALS — BP 142/86 | HR 84 | Temp 98.1°F | Resp 18 | Ht 70.0 in | Wt 189.0 lb

## 2016-07-12 DIAGNOSIS — J0100 Acute maxillary sinusitis, unspecified: Secondary | ICD-10-CM | POA: Diagnosis not present

## 2016-07-12 DIAGNOSIS — I2510 Atherosclerotic heart disease of native coronary artery without angina pectoris: Secondary | ICD-10-CM

## 2016-07-12 MED ORDER — AZITHROMYCIN 250 MG PO TABS: ORAL_TABLET | ORAL | 0 refills | Status: DC

## 2016-07-12 NOTE — Progress Notes (Signed)
   Subjective:    Patient ID: Barry [name], male    DOB: 10-29-1932, 82 y.o.   MRN: [id number]  Patient presents for Illness (x2 weeks- sinus pressure- has been seen at [facility] )   Is here with sinus pressure or drainage for the past 2 weeks. He was seen at the urgent care 9 days ago he was prescribed Levaquin but states that this does not improve his symptoms he also sees Afrin for about 3 days. Denies any fever no cough no congestion he does have Xyzal at home but was afraid of the side effects therefore did not take    Review Of Systems:  GEN- denies fatigue, fever, weight loss,weakness, recent illness HEENT- denies eye drainage, change in vision, +nasal discharge, CVS- denies chest pain, palpitations RESP- denies SOB, cough, wheeze ABD- denies N/V, change in stools, abd pain Neuro- + headache, denies dizziness, syncope, seizure activity       Objective:    BP (!) 142/86 (BP Location: Right Arm, Patient Position: Sitting, Cuff Size: Normal)   Pulse 84   Temp 98.1 F (36.7 C) (Oral)   Resp 18   Ht 5\' 10"  (1.778 m)   Wt 189 lb (85.7 kg)   SpO2 97% Comment: RA  BMI 27.12 kg/m  GEN- NAD, alert and oriented x3 HEENT- PERRL, EOMI, non injected sclera, pink conjunctiva, MMM, oropharynx clear, TM clear bilat no effusion,  + maxillary sinus tenderness, inflammed turbinates,  Nasal drainage  Neck- Supple, no LAD CVS- RRR, no murmur RESP-CTAB EXT- No edema Pulses- Radial 2+         Assessment & Plan:      Problem List Items Addressed This Visit    None    Visit Diagnoses    Acute maxillary sinusitis, recurrence not specified    -  Primary   sinusitis, not improving, change to nasal saline, add xyzal 2.5mg , zpak given, no decongestants with his BP   Relevant Medications   azithromycin (ZITHROMAX) 250 MG tablet      Note: This dictation was prepared with Dragon dictation along with smaller phrase technology. Any transcriptional errors that result from this process are  unintentional.

## 2016-07-12 NOTE — Patient Instructions (Signed)
Take zpak  Use nasal saline Take 1/2 of the xyzal allergy pill at bedtime F/U as previous

## 2016-07-20 ENCOUNTER — Other Ambulatory Visit: Payer: Self-pay | Admitting: *Deleted

## 2016-07-20 DIAGNOSIS — I714 Abdominal aortic aneurysm, without rupture, unspecified: Secondary | ICD-10-CM

## 2016-07-20 DIAGNOSIS — Z95828 Presence of other vascular implants and grafts: Secondary | ICD-10-CM

## 2016-07-23 ENCOUNTER — Encounter: Payer: Self-pay | Admitting: Vascular Surgery

## 2016-07-27 ENCOUNTER — Encounter: Payer: Self-pay | Admitting: Vascular Surgery

## 2016-07-27 ENCOUNTER — Ambulatory Visit (HOSPITAL_COMMUNITY)
Admission: RE | Admit: 2016-07-27 | Discharge: 2016-07-27 | Disposition: A | Discharge status: Home / Self Care | Payer: Medicare Other | Source: Ambulatory Visit | Attending: Vascular Surgery | Admitting: Vascular Surgery

## 2016-07-27 ENCOUNTER — Ambulatory Visit (INDEPENDENT_AMBULATORY_CARE_PROVIDER_SITE_OTHER): Payer: Medicare Other | Admitting: Vascular Surgery

## 2016-07-27 VITALS — BP 142/80 | HR 72 | Temp 97.1°F | Resp 18 | Ht 70.0 in | Wt 187.6 lb

## 2016-07-27 DIAGNOSIS — Z95828 Presence of other vascular implants and grafts: Secondary | ICD-10-CM | POA: Diagnosis not present

## 2016-07-27 DIAGNOSIS — I2583 Coronary atherosclerosis due to lipid rich plaque: Secondary | ICD-10-CM | POA: Diagnosis not present

## 2016-07-27 DIAGNOSIS — I714 Abdominal aortic aneurysm, without rupture, unspecified: Secondary | ICD-10-CM

## 2016-07-27 DIAGNOSIS — I2510 Atherosclerotic heart disease of native coronary artery without angina pectoris: Secondary | ICD-10-CM | POA: Diagnosis not present

## 2016-07-27 NOTE — Progress Notes (Signed)
Vitals:   07/27/16 0848 07/27/16 0852  BP: (!) 152/80 (!) 142/80  Pulse: 72   Resp: 18   Temp: 97.1 F (36.2 C)   TempSrc: Oral   SpO2: 95%   Weight: 187 lb 9.6 oz (85.1 kg)   Height: 5\' 10"  (1.778 m)

## 2016-07-27 NOTE — Progress Notes (Signed)
[facility] [place]  Patient name: Barry [name] MRN: [id number] DOB: 1933/04/20 Sex: male  REASON FOR VISIT: Follow-up stent graft repair of abdominal aortic aneurysm  HPI: Barry [name] is a 82 y.o. male here today for follow-up of stent graft repair of abdominal aortic aneurysm in 2010. He begins to be tens to be very active at his age of 44. Had no new medical difficulties.  Past Medical History:  Diagnosis Date  . AAA (abdominal aortic aneurysm) (HCC) 3-09   4.7 cm  . Allergy    rhinitis  . Anxiety   . Cancer ([place])    skin  . Cataract   . Depression   . Diabetes mellitus   . Diverticulosis   . GERD (gastroesophageal reflux disease)   . Hepatitis   . Hx of colonic polyps 08/26/2015  . Hyperlipidemia   . Hypertension   . Nephrolithiasis   . Personal history of colonic polyps    adenomas, serrated also  . PVD (peripheral vascular disease) ([place])   . Tobacco abuse     Family History  Problem Relation Age of Onset  . Emphysema Father   . Cancer Brother     Lung  . Depression Brother   . Depression Sister   . Lung disease Sister     Fibrosis and died from pneumonia on 2013-04-06  . Kidney disease Sister   . Diabetes Sister   . Learning disabilities Sister   . Mental retardation Sister   . Heart disease Mother   . Cancer Brother   . Colon cancer Neg Hx   . Dementia Neg Hx   . Alcohol abuse Neg Hx   . Drug abuse Neg Hx   . Paranoid behavior Neg Hx   . Schizophrenia Neg Hx   . Anxiety disorder Neg Hx   . Bipolar disorder Neg Hx   . OCD Neg Hx   . Sexual abuse Neg Hx   . Physical abuse Neg Hx   . Seizures Neg Hx   . Esophageal cancer Neg Hx   . Stomach cancer Neg Hx   . Rectal cancer Neg Hx     SOCIAL HISTORY: Social History  Substance Use Topics  . Smoking status: Current Every Day Smoker    Packs/day: 1.00    Years: 60.00    Types: Cigarettes  . Smokeless tobacco: Never Used   Comment: smokes about 16-20 cigs a day as of 04/04/2013  . Alcohol use No    Allergies  Allergen Reactions  . Lisinopril-Hydrochlorothiazide Swelling    ZESTRIL  . Doxycycline Rash    Current Outpatient Prescriptions  Medication Sig Dispense Refill  . aspirin EC 81 MG tablet Take 81 mg by mouth every morning.    Marland Kitchen azithromycin (ZITHROMAX) 250 MG tablet Take 2 tablets x 1 day, then 1 tablet daily for 4 days 6 tablet 0  . FLUoxetine (PROZAC) 20 MG capsule Take 1 capsule (20 mg total) by mouth every morning. 90 capsule 3  . glimepiride (AMARYL) 1 MG tablet TAKE 1 BY MOUTH TWICE DAILY 180 tablet 3  . hydrochlorothiazide (HYDRODIURIL) 25 MG tablet Take 1 tablet (25 mg total) by mouth daily. 90 tablet 0  . levocetirizine (XYZAL) 5 MG tablet Take 1 tablet (5 mg total) by mouth every evening. 30 tablet 3  . LORazepam (ATIVAN) 1 MG tablet Take 1 tablet (1 mg total) by mouth 3 (three) times daily. 90 tablet 3  . metoprolol succinate (TOPROL-XL)  25 MG 24 hr tablet Take 0.5 tablets (12.5 mg total) by mouth daily. 90 tablet 3  . NIFEdipine (NIFEDICAL XL) 30 MG 24 hr tablet TAKE 1 BY MOUTH DAILY 90 tablet 0  . nortriptyline (PAMELOR) 10 MG capsule Take 1 capsule (10 mg total) by mouth at bedtime. 90 capsule 3  . ramipril (ALTACE) 10 MG capsule TAKE 1 BY MOUTH DAILY 90 capsule 1  . sildenafil (VIAGRA) 50 MG tablet Take 1 tablet (50 mg total) by mouth daily as needed for erectile dysfunction. 2 tablet 0  . simvastatin (ZOCOR) 40 MG tablet Take 1 tablet (40 mg total) by mouth at bedtime. 90 tablet 3   No current facility-administered medications for this visit.     REVIEW OF SYSTEMS:  [X]  denotes positive finding, [ ]  denotes negative finding Cardiac  Comments:  Chest pain or chest pressure:    Shortness of breath upon exertion:    Short of breath when lying flat:    Irregular heart rhythm:        Vascular    Pain in calf, thigh, or hip brought on by ambulation:    Pain in feet at night that  wakes you up from your sleep:     Blood clot in your veins:    Leg swelling:         Pulmonary    Oxygen at home:    Productive cough:     Wheezing:         Neurologic    Sudden weakness in arms or legs:     Sudden numbness in arms or legs:     Sudden onset of difficulty speaking or slurred speech:    Temporary loss of vision in one eye:     Problems with dizziness:         Gastrointestinal    Blood in stool:     Vomited blood:         Genitourinary    Burning when urinating:     Blood in urine:        Psychiatric    Major depression:         Hematologic    Bleeding problems:    Problems with blood clotting too easily:        Skin    Rashes or ulcers:        Constitutional    Fever or chills:      PHYSICAL EXAM: Vitals:   07/27/16 0848 07/27/16 0852  BP: (!) 152/80 (!) 142/80  Pulse: 72   Resp: 18   Temp: 97.1 F (36.2 C)   TempSrc: Oral   SpO2: 95%   Weight: 187 lb 9.6 oz (85.1 kg)   Height: 5\' 10"  (1.778 m)     GENERAL: The patient is a well-nourished male, in no acute distress. The vital signs are documented above. CARDIAC: There is a regular rate and rhythm.  VASCULAR: 2+ radial 2+ femoral pulses PULMONARY: There is good air exchange bilaterally without wheezing or rales. ABDOMEN: Soft and non-tender no aneurysm palpable MUSCULOSKELETAL: There are no major deformities or cyanosis. NEUROLOGIC: No focal weakness or paresthesias are detected. SKIN: There are no ulcers or rashes noted. PSYCHIATRIC: The patient has a normal affect.  DATA:  Ultrasound today shows maximal diameter of 3.6 cm which compares to CT scan from 2015 of 3.8 cm will 7 years status post stent graft    MEDICAL ISSUES: Stable status post 7 years stent graft repair of abdominal aortic aneurysm.  We'll continue full activities. We'll see him in 2 years for repeat ultrasound    Rosetta Posner, MD [facility] Tel [phone] Pager  [phone]

## 2016-08-04 ENCOUNTER — Ambulatory Visit (INDEPENDENT_AMBULATORY_CARE_PROVIDER_SITE_OTHER): Payer: Medicare Other | Admitting: Psychiatry

## 2016-08-04 ENCOUNTER — Encounter (HOSPITAL_COMMUNITY): Payer: Self-pay | Admitting: Psychiatry

## 2016-08-04 VITALS — BP 178/96 | HR 92 | Ht 70.0 in | Wt 189.0 lb

## 2016-08-04 DIAGNOSIS — F418 Other specified anxiety disorders: Secondary | ICD-10-CM | POA: Diagnosis not present

## 2016-08-04 DIAGNOSIS — F5105 Insomnia due to other mental disorder: Secondary | ICD-10-CM

## 2016-08-04 DIAGNOSIS — I2583 Coronary atherosclerosis due to lipid rich plaque: Secondary | ICD-10-CM | POA: Diagnosis not present

## 2016-08-04 DIAGNOSIS — Z809 Family history of malignant neoplasm, unspecified: Secondary | ICD-10-CM

## 2016-08-04 DIAGNOSIS — Z818 Family history of other mental and behavioral disorders: Secondary | ICD-10-CM

## 2016-08-04 DIAGNOSIS — Z833 Family history of diabetes mellitus: Secondary | ICD-10-CM

## 2016-08-04 DIAGNOSIS — F331 Major depressive disorder, recurrent, moderate: Secondary | ICD-10-CM | POA: Diagnosis not present

## 2016-08-04 DIAGNOSIS — Z810 Family history of intellectual disabilities: Secondary | ICD-10-CM

## 2016-08-04 DIAGNOSIS — I2510 Atherosclerotic heart disease of native coronary artery without angina pectoris: Secondary | ICD-10-CM | POA: Diagnosis not present

## 2016-08-04 MED ORDER — LORAZEPAM 1 MG PO TABS: 1.0000 mg | ORAL_TABLET | Freq: Three times a day (TID) | ORAL | 3 refills | Status: DC

## 2016-08-04 MED ORDER — FLUOXETINE HCL 20 MG PO CAPS: 20.0000 mg | ORAL_CAPSULE | Freq: Every morning | ORAL | 3 refills | Status: DC

## 2016-08-04 NOTE — Progress Notes (Signed)
Patient ID: Barry [name], male   DOB: 16-Jun-1933, 82 y.o.   MRN: [id number] Patient ID: Barry [name], male   DOB: [birth year], 82 y.o.   MRN: [id number] Patient ID: Barry [name], male   DOB: [birth year], 82 y.o.   MRN: [id number] Patient ID: Barry [name], male   DOB: 22-Jun-1933, 82 y.o.   MRN: [id number] Patient ID: Barry [name], male   DOB: Aug 21, 1933, 82 y.o.   MRN: [id number] Patient ID: Barry [name], male   DOB: 12/15/1932, 82 y.o.   MRN: [id number] Patient ID: Barry [name], male   DOB: [birth year], 82 y.o.   MRN: [id number] Patient ID: Barry [name], male   DOB: 1933-09-09, 82 y.o.   MRN: [id number] Patient ID: Barry [name], male   DOB: 10/17/1933, 82 y.o.   MRN: [id number] Patient ID: Barry [name], male   DOB: 1933-10-02, 82 y.o.   MRN: [id number] Patient ID: Barry [name], male   DOB: [birth year], 82 y.o.   MRN: [id number] [facility] [id number] Progress Note Barry [name] MRN: [id number] DOB: Jul 28, 1933 Age: 82 y.o.  Date: 08/04/2016 Start Time: 10:15 AM End Time: 10:30 AM  Chief Complaint: Chief Complaint  Patient presents with  . Depression  . Anxiety  . Follow-up   Subjective: I'm doing well".  This patient is an 82  year-old widowed white male who lives alone in [place]. He has 2 children and 3 grandchildren who live in [place]. 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  The patient states that he developed anxiety and depression in his 12s. His depression worsened when his wife died suddenly 29 years ago. However he is doing very well on his current medications. He denies being depressed or anxious and he is sleeping quite well. He spends most of his time with her grandchildren and really enjoys it. His social life is quite active. He has no side effects or complaints regarding his medications  The patient returns after 4 months He breaks a 1 mg Ativan in 2 half doses and takes 4 a day.  This is controlling his anxiety most of the time. However recently he had sinusitis and he got very agitated and was wondering if the Ativan was no longer working. Now that he is feeling better he thinks it is working. He states his mood is good he still staying very active with his grandchildren and mowing the lawns. For the most part he is sleeping well  Current psychiatric medication Ativan 0.5 mg 4 times a day Nortriptyline 10 mg daily Prozac 20 mg daily   Vitals: BP (!) 178/96 (BP Location: Left Arm, Cuff Size: Large)   Pulse 92   Ht 5' 10" (1.778 m)   Wt 189 lb (85.7 kg)   BMI 27.12 kg/m   Allergies: Allergies  Allergen Reactions  . Lisinopril-Hydrochlorothiazide Swelling    ZESTRIL  . Doxycycline Rash   Medical History: Past Medical History:  Diagnosis Date  . AAA (abdominal aortic aneurysm) (HCC) 3-09   4.7 cm  . Allergy    rhinitis  . Anxiety   . Cancer ([place])    skin  . Cataract   . Depression   . Diabetes mellitus   . Diverticulosis   . GERD (gastroesophageal reflux disease)   . Hepatitis   . Hx of colonic polyps 08/26/2015  . Hyperlipidemia   . Hypertension   . Nephrolithiasis   . Personal history of colonic  polyps    adenomas, serrated also  . PVD (peripheral vascular disease) ([place])   . Tobacco abuse   Patient has history of diabetes mellitus, hypertension, peripheral vascular disease, abdominal aortic aneurysm, diverticulosis, GERD and hyperlipidemia.  He sees Dr. Timoteo Gaul regularly and will be going back soon for blood work.   Surgical History: Past Surgical History:  Procedure Laterality Date  . COLONOSCOPY  2006, 2008, 2010, 05/27/2011   numerous adenomas - 13 in 2006, 3, 2008, 2 2012 (up to 1 cm), 4 diminutive adenomas, serrated adenomas 2012. Diverticulosis and hemorrhoids also.  [facility] EYE SURGERY    . EYE SURGERY Left   . Kidney stone operation    . lower limb amputation, other toe 5th    . percutaneous stent graft repair of infrarenal AAA  11/10    5.6 cm (T. early)  . surgical excision basal cell carcinoma    . tympanic eardrum repair    . VASECTOMY     Family History: family history includes Cancer in his brother and brother; Depression in his brother and sister; Diabetes in his sister; Emphysema in his father; Heart disease in his mother; Kidney disease in his sister; Learning disabilities in his sister; Lung disease in his sister; Mental retardation in his sister. Reviewed and the history of his sisters was clarified and the passing of [name] recorded.  Mental status examination Patient is well groomed well dressed.  He is pleasant.  He maintained good eye contact. His speech is soft clear and coherent. His thought process is logical linear and goal-directed. He denies any active or passive suicidal thoughts or homicidal thoughts.  He denies any auditory or visual hallucination.  He described his mood as good and his affect is mood congruent. He is a little more anxious today and his blood pressure was high but when I rechecked it myself it had come down to 150/76  There no psychotic symptoms present. He's alert and oriented x3. His insight judgment and impulse control is okay. His memory functions speech and fund of knowledge are all good  Lab Results:  Results for orders placed or performed in visit on 05/27/16 (from the past 8736 hour(s))  CBC with Differential/Platelet   Collection Time: 05/27/16 10:43 AM  Result Value Ref Range   WBC 7.1 3.8 - 10.8 K/uL   RBC 5.22 4.20 - 5.80 MIL/uL   Hemoglobin 16.7 13.0 - 17.0 g/dL   HCT 49.4 38.5 - 50.0 %   MCV 94.6 80.0 - 100.0 fL   MCH 32.0 27.0 - 33.0 pg   MCHC 33.8 32.0 - 36.0 g/dL   RDW 14.3 11.0 - 15.0 %   Platelets 253 140 - 400 K/uL   MPV 10.1 7.5 - 12.5 fL   Neutro Abs 4,331 1,500 - 7,800 cells/uL   Lymphs Abs 1,846 850 - 3,900 cells/uL   Monocytes Absolute 781 200 - 950 cells/uL   Eosinophils Absolute 71 15 - 500 cells/uL   Basophils Absolute 71 0 - 200 cells/uL    Neutrophils Relative % 61 %   Lymphocytes Relative 26 %   Monocytes Relative 11 %   Eosinophils Relative 1 %   Basophils Relative 1 %   Smear Review Criteria for review not met   COMPLETE METABOLIC PANEL WITH GFR   Collection Time: 05/27/16 10:43 AM  Result Value Ref Range   Sodium 141 135 - 146 mmol/L   Potassium 4.7 3.5 - 5.3 mmol/L   Chloride 102 98 - 110 mmol/L  CO2 29 20 - 31 mmol/L   Glucose, Bld 138 (H) 70 - 99 mg/dL   BUN 20 7 - 25 mg/dL   Creat 1.06 0.70 - 1.11 mg/dL   Total Bilirubin 0.4 0.2 - 1.2 mg/dL   Alkaline Phosphatase 46 40 - 115 U/L   AST 17 10 - 35 U/L   ALT 14 9 - 46 U/L   Total Protein 6.8 6.1 - 8.1 g/dL   Albumin 4.1 3.6 - 5.1 g/dL   Calcium 9.5 8.6 - 10.3 mg/dL   GFR, Est African American 76 >=60 mL/min   GFR, Est Non African American 66 >=60 mL/min  Lipid panel   Collection Time: 05/27/16 10:43 AM  Result Value Ref Range   Cholesterol 210 (H) 125 - 200 mg/dL   Triglycerides 132 <150 mg/dL   HDL 50 >=40 mg/dL   Total CHOL/HDL Ratio 4.2 <=5.0 Ratio   VLDL 26 <30 mg/dL   LDL Cholesterol 134 (H) <130 mg/dL  Hemoglobin A1c   Collection Time: 05/27/16 10:43 AM  Result Value Ref Range   Hgb A1c MFr Bld 6.5 (H) <5.7 %   Mean Plasma Glucose 140 mg/dL  PSA   Collection Time: 05/27/16 10:43 AM  Result Value Ref Range   PSA 0.3 <=4.0 ng/mL  Microalbumin/Creatinine Ratio, Urine   Collection Time: 05/27/16 10:44 AM  Result Value Ref Range   Creatinine, Urine 190 20 - 370 mg/dL   Microalb, Ur 18.1 Not estab mg/dL   Microalb Creat Ratio 95 (H) <30 mcg/mg creat  Results for orders placed or performed in visit on 01/20/16 (from the past 8736 hour(s))  COMPLETE METABOLIC PANEL WITH GFR   Collection Time: 01/20/16  8:16 AM  Result Value Ref Range   Sodium 138 135 - 146 mmol/L   Potassium 4.4 3.5 - 5.3 mmol/L   Chloride 102 98 - 110 mmol/L   CO2 30 20 - 31 mmol/L   Glucose, Bld 136 (H) 70 - 99 mg/dL   BUN 23 7 - 25 mg/dL   Creat 0.99 0.70 - 1.11 mg/dL    Total Bilirubin 0.4 0.2 - 1.2 mg/dL   Alkaline Phosphatase 44 40 - 115 U/L   AST 16 10 - 35 U/L   ALT 10 9 - 46 U/L   Total Protein 6.2 6.1 - 8.1 g/dL   Albumin 3.7 3.6 - 5.1 g/dL   Calcium 8.8 8.6 - 10.3 mg/dL   GFR, Est African American 83 >=60 mL/min   GFR, Est Non African American 72 >=60 mL/min  Lipid panel   Collection Time: 01/20/16  8:16 AM  Result Value Ref Range   Cholesterol 164 125 - 200 mg/dL   Triglycerides 85 <150 mg/dL   HDL 47 >=40 mg/dL   Total CHOL/HDL Ratio 3.5 <=5.0 Ratio   VLDL 17 <30 mg/dL   LDL Cholesterol 100 <130 mg/dL  CBC with Differential/Platelet   Collection Time: 01/20/16  8:16 AM  Result Value Ref Range   WBC 6.3 3.8 - 10.8 K/uL   RBC 5.17 4.20 - 5.80 MIL/uL   Hemoglobin 16.5 13.0 - 17.0 g/dL   HCT 48.7 38.5 - 50.0 %   MCV 94.2 80.0 - 100.0 fL   MCH 31.9 27.0 - 33.0 pg   MCHC 33.9 32.0 - 36.0 g/dL   RDW 14.1 11.0 - 15.0 %   Platelets 224 140 - 400 K/uL   MPV 10.2 7.5 - 12.5 fL   Neutro Abs 3,402 1,500 - 7,800 cells/uL     Lymphs Abs 1,890 850 - 3,900 cells/uL   Monocytes Absolute 819 200 - 950 cells/uL   Eosinophils Absolute 126 15 - 500 cells/uL   Basophils Absolute 63 0 - 200 cells/uL   Neutrophils Relative % 54 %   Lymphocytes Relative 30 %   Monocytes Relative 13 %   Eosinophils Relative 2 %   Basophils Relative 1 %   Smear Review Criteria for review not met   Hemoglobin A1c   Collection Time: 01/20/16  8:16 AM  Result Value Ref Range   Hgb A1c MFr Bld 7.0 (H) <5.7 %   Mean Plasma Glucose 154 mg/dL  Results for orders placed or performed in visit on 09/22/15 (from the past 8736 hour(s))  Basic metabolic panel   Collection Time: 09/22/15  9:56 AM  Result Value Ref Range   Sodium 135 135 - 146 mmol/L   Potassium 4.2 3.5 - 5.3 mmol/L   Chloride 101 98 - 110 mmol/L   CO2 31 20 - 31 mmol/L   Glucose, Bld 142 (H) 70 - 99 mg/dL   BUN 24 7 - 25 mg/dL   Creat 1.05 0.70 - 1.11 mg/dL   Calcium 9.0 8.6 - 10.3 mg/dL  Hemoglobin A1c    Collection Time: 09/22/15  9:56 AM  Result Value Ref Range   Hgb A1c MFr Bld 6.8 (H) <5.7 %   Mean Plasma Glucose 148 (H) <117 mg/dL  Results for orders placed or performed in visit on 08/20/15 (from the past 8736 hour(s))  Glucose, capillary   Collection Time: 08/20/15 10:44 AM  Result Value Ref Range   Glucose-Capillary 112 (H) 65 - 99 mg/dL  Glucose, capillary   Collection Time: 08/20/15 12:46 PM  Result Value Ref Range   Glucose-Capillary 97 65 - 99 mg/dL   Comment 1 Notify RN    Comment 2 Document in Chart   PCP draws routine labs and nothing is emerging as of concern.  Assessment Axis I Depressive disorder NOS Axis II deferred Axis III see medical history Axis IV mild to moderate Axis V 65-70  Plan/Discussion: I took his vitals.  I reviewed CC, tobacco/med/surg Hx, meds effects/ side effects, problem list, therapies and responses as well as current situation/symptoms discussed options. Continue prozac and nortriptyline for depression and lorazepam for anxiety He'll return in 4 months See orders and pt instructions for more details.  MEDICATIONS this encounter: Meds ordered this encounter  Medications  . FLUoxetine (PROZAC) 20 MG capsule    Sig: Take 1 capsule (20 mg total) by mouth every morning.    Dispense:  90 capsule    Refill:  3  . LORazepam (ATIVAN) 1 MG tablet    Sig: Take 1 tablet (1 mg total) by mouth 3 (three) times daily.    Dispense:  90 tablet    Refill:  3    Medical Decision Making Problem Points:  Established problem, stable/improving (1), Review of last therapy session (1) and Review of psycho-social stressors (1) Data Points:  Review or order clinical lab tests (1) Review of medication regiment & side effects (2)  I certify that outpatient services furnished can reasonably be expected to improve the patient's condition.   ROSS, DEBORAH, MD 

## 2016-08-13 ENCOUNTER — Telehealth: Payer: Self-pay | Admitting: *Deleted

## 2016-08-13 ENCOUNTER — Ambulatory Visit (INDEPENDENT_AMBULATORY_CARE_PROVIDER_SITE_OTHER): Payer: Medicare Other | Admitting: Family Medicine

## 2016-08-13 ENCOUNTER — Encounter: Payer: Self-pay | Admitting: Family Medicine

## 2016-08-13 VITALS — BP 130/76 | HR 78 | Temp 97.9°F | Resp 16 | Ht 70.0 in | Wt 185.0 lb

## 2016-08-13 DIAGNOSIS — J301 Allergic rhinitis due to pollen: Secondary | ICD-10-CM

## 2016-08-13 DIAGNOSIS — I2510 Atherosclerotic heart disease of native coronary artery without angina pectoris: Secondary | ICD-10-CM | POA: Diagnosis not present

## 2016-08-13 DIAGNOSIS — J329 Chronic sinusitis, unspecified: Secondary | ICD-10-CM

## 2016-08-13 DIAGNOSIS — I2583 Coronary atherosclerosis due to lipid rich plaque: Secondary | ICD-10-CM

## 2016-08-13 MED ORDER — AZITHROMYCIN 250 MG PO TABS: ORAL_TABLET | ORAL | 0 refills | Status: DC

## 2016-08-13 MED ORDER — METHYLPREDNISOLONE ACETATE 40 MG/ML IJ SUSP
40.0000 mg | Freq: Once | INTRAMUSCULAR | Status: AC
  Administered 2016-08-13: 40 mg via INTRAMUSCULAR

## 2016-08-13 NOTE — Telephone Encounter (Addendum)
Call placed to patient and patient made aware.   Appointment scheduled.  

## 2016-08-13 NOTE — Telephone Encounter (Signed)
He has been on 2 rounds of antibiotics, I want to recheck him He should be using the allergy medication xyzal 2.5mg  at bedtime Also have him add Afrin he can use for 3 days, this was decongest him

## 2016-08-13 NOTE — Progress Notes (Signed)
   Subjective:    Patient ID: Barry [name], male    DOB: 08-Nov-1932, 82 y.o.   MRN: [id number]  Patient presents for Illness (x2 weeks- sinus pressure, nasal drainage, HA- denies fever) Patient with recurrent sinusitis symptoms. He was treated 4 weeks ago appendectomy and recently [facility]. He states that he improved for about 2 weeks and that the symptoms came back he has a lot of sinus pressure and pain in the maxillary and ethmoid sinus region he does have some underlying allergies as been using nasal saline and his Xyzal 2.5 mg. He does have some Afrin at home that he would like to try. He has not had any fever he has thick drainage from his nose. He has not had any significant cough    Review Of Systems:  GEN- denies fatigue, fever, weight loss,weakness, recent illness HEENT- denies eye drainage, change in vision,+ nasal discharge, CVS- denies chest pain, palpitations RESP- denies SOB, cough, wheeze ABD- denies N/V, change in stools, abd pain GU- denies dysuria, hematuria, dribbling, incontinence MSK- denies joint pain, muscle aches, injury Neuro- denies headache, dizziness, syncope, seizure activity       Objective:    BP 130/76 (BP Location: Left Arm, Patient Position: Sitting, Cuff Size: Large)   Pulse 78   Temp 97.9 F (36.6 C) (Oral)   Resp 16   Ht 5\' 10"  (1.778 m)   Wt 185 lb (83.9 kg)   SpO2 98%   BMI 26.54 kg/m  GEN- NAD, alert and oriented x3 HEENT- PERRL, EOMI, non injected sclera, pink conjunctiva, MMM, oropharynx clear , TM clear bilat no effusion,  + maxillary/ Left frontal ethmoid  sinus tenderness, inflammed turbinates,  Nasal drainage  Neck- Supple, no LAD CVS- RRR, no murmur RESP-CTAB EXT- No edema Pulses- Radial 2+          Assessment & Plan:      Problem List Items Addressed This Visit    None    Visit Diagnoses    Recurrent sinusitis    -  Primary   Depo Medrol 40mg  IM given, treat with another round of azithromycin,  Afrin for 3 days, continue allergy med. Discussed flonase afterwards he can use for allergies as I think that there is an underlying component of this as well. He was advised that his blood sugar may run up a little bit from the shot today    Relevant Medications   azithromycin (ZITHROMAX) 250 MG tablet   methylPREDNISolone acetate (DEPO-MEDROL) injection 40 mg (Completed)   Acute seasonal allergic rhinitis due to pollen          Note: This dictation was prepared with Dragon dictation along with smaller phrase technology. Any transcriptional errors that result from this process are unintentional.

## 2016-08-13 NOTE — Telephone Encounter (Signed)
Received call from patient.   Patient reports that he has completed ABTx for sinus infection. States that he felt fine for a few weeks, but now Sx have returned. Reports increased pressure to maxillary sinuses and copious amounts of nasal drainage. Also reports HA that does not respond to OTC IBU. Denies fever.   MD please advise.

## 2016-08-13 NOTE — Patient Instructions (Signed)
Use Afrin for 3 days Take 2nd course of antibiotics Wait 1 week then get the flu shot F/U as previous

## 2016-08-28 DIAGNOSIS — Z23 Encounter for immunization: Secondary | ICD-10-CM | POA: Diagnosis not present

## 2016-09-16 ENCOUNTER — Other Ambulatory Visit: Payer: Self-pay | Admitting: *Deleted

## 2016-09-16 ENCOUNTER — Telehealth: Payer: Self-pay | Admitting: Family Medicine

## 2016-09-16 MED ORDER — SIMVASTATIN 40 MG PO TABS: 40.0000 mg | ORAL_TABLET | Freq: Every day | ORAL | 3 refills | Status: DC

## 2016-09-16 MED ORDER — HYDROCHLOROTHIAZIDE 25 MG PO TABS: 25.0000 mg | ORAL_TABLET | Freq: Every day | ORAL | 3 refills | Status: DC

## 2016-09-16 NOTE — Telephone Encounter (Signed)
Medication has been sent to pharmacy x2.

## 2016-09-16 NOTE — Telephone Encounter (Signed)
Patient states [facility] has sent two request for hydrochlorothiazide please send in for patient.   CB# [phone]

## 2016-09-16 NOTE — Telephone Encounter (Signed)
Received fax requesting refill on HCTZ, and Zocor.   Refill appropriate and filled per protocol.

## 2016-09-24 ENCOUNTER — Other Ambulatory Visit: Payer: Medicare Other

## 2016-09-24 DIAGNOSIS — E785 Hyperlipidemia, unspecified: Secondary | ICD-10-CM | POA: Diagnosis not present

## 2016-09-24 DIAGNOSIS — E119 Type 2 diabetes mellitus without complications: Secondary | ICD-10-CM

## 2016-09-24 DIAGNOSIS — I10 Essential (primary) hypertension: Secondary | ICD-10-CM | POA: Diagnosis not present

## 2016-09-24 DIAGNOSIS — Z0000 Encounter for general adult medical examination without abnormal findings: Secondary | ICD-10-CM

## 2016-09-24 DIAGNOSIS — Z125 Encounter for screening for malignant neoplasm of prostate: Secondary | ICD-10-CM

## 2016-09-25 LAB — PSA: PSA: 0.4 ng/mL (ref <=4.0)

## 2016-09-25 LAB — MICROALBUMIN / CREATININE URINE RATIO
Creatinine, Urine: 145 mg/dL (ref 20–370)
MICROALB UR: 15 mg/dL
MICROALB/CREAT RATIO: 103 ug/mg{creat} — AB (ref <30)

## 2016-09-25 LAB — COMPLETE METABOLIC PANEL WITH GFR
ALBUMIN: 3.7 g/dL (ref 3.6–5.1)
ALK PHOS: 39 U/L — AB (ref 40–115)
ALT: 14 U/L (ref 9–46)
AST: 19 U/L (ref 10–35)
BILIRUBIN TOTAL: 0.3 mg/dL (ref 0.2–1.2)
BUN: 22 mg/dL (ref 7–25)
CO2: 31 mmol/L (ref 20–31)
Calcium: 9 mg/dL (ref 8.6–10.3)
Chloride: 102 mmol/L (ref 98–110)
Creat: 0.91 mg/dL (ref 0.70–1.11)
GFR, EST NON AFRICAN AMERICAN: 79 mL/min (ref >=60)
GLUCOSE: 123 mg/dL — AB (ref 70–99)
POTASSIUM: 4.4 mmol/L (ref 3.5–5.3)
SODIUM: 137 mmol/L (ref 135–146)
Total Protein: 6.3 g/dL (ref 6.1–8.1)

## 2016-09-25 LAB — CBC WITH DIFFERENTIAL/PLATELET
BASOS ABS: 59 {cells}/uL (ref 0–200)
Basophils Relative: 1 %
EOS ABS: 177 {cells}/uL (ref 15–500)
EOS PCT: 3 %
HCT: 49.1 % (ref 38.5–50.0)
HEMOGLOBIN: 16.1 g/dL (ref 13.0–17.0)
Lymphocytes Relative: 27 %
Lymphs Abs: 1593 cells/uL (ref 850–3900)
MCH: 32.1 pg (ref 27.0–33.0)
MCHC: 32.8 g/dL (ref 32.0–36.0)
MCV: 97.8 fL (ref 80.0–100.0)
MONOS PCT: 15 %
MPV: 9.7 fL (ref 7.5–12.5)
Monocytes Absolute: 885 cells/uL (ref 200–950)
NEUTROS PCT: 54 %
Neutro Abs: 3186 cells/uL (ref 1500–7800)
PLATELETS: 240 10*3/uL (ref 140–400)
RBC: 5.02 MIL/uL (ref 4.20–5.80)
RDW: 14 % (ref 11.0–15.0)
WBC: 5.9 10*3/uL (ref 3.8–10.8)

## 2016-09-25 LAB — LIPID PANEL
CHOL/HDL RATIO: 4.1 ratio (ref <5.0)
CHOLESTEROL: 176 mg/dL (ref <200)
HDL: 43 mg/dL (ref >40)
LDL Cholesterol: 113 mg/dL — ABNORMAL HIGH (ref <100)
TRIGLYCERIDES: 99 mg/dL (ref <150)
VLDL: 20 mg/dL (ref <30)

## 2016-09-25 LAB — HEMOGLOBIN A1C
HEMOGLOBIN A1C: 6.4 % — AB (ref <5.7)
Mean Plasma Glucose: 137 mg/dL

## 2016-09-27 ENCOUNTER — Ambulatory Visit (INDEPENDENT_AMBULATORY_CARE_PROVIDER_SITE_OTHER): Payer: Medicare Other | Admitting: Family Medicine

## 2016-09-27 ENCOUNTER — Encounter: Payer: Self-pay | Admitting: Family Medicine

## 2016-09-27 VITALS — BP 128/78 | HR 80 | Temp 97.7°F | Resp 14 | Ht 70.0 in | Wt 183.0 lb

## 2016-09-27 DIAGNOSIS — I2583 Coronary atherosclerosis due to lipid rich plaque: Secondary | ICD-10-CM | POA: Diagnosis not present

## 2016-09-27 DIAGNOSIS — E782 Mixed hyperlipidemia: Secondary | ICD-10-CM | POA: Diagnosis not present

## 2016-09-27 DIAGNOSIS — E119 Type 2 diabetes mellitus without complications: Secondary | ICD-10-CM

## 2016-09-27 DIAGNOSIS — I2510 Atherosclerotic heart disease of native coronary artery without angina pectoris: Secondary | ICD-10-CM

## 2016-09-27 DIAGNOSIS — I10 Essential (primary) hypertension: Secondary | ICD-10-CM | POA: Diagnosis not present

## 2016-09-27 MED ORDER — RAMIPRIL 10 MG PO CAPS: ORAL_CAPSULE | ORAL | 1 refills | Status: DC

## 2016-09-27 MED ORDER — METOPROLOL SUCCINATE ER 25 MG PO TB24: 12.5000 mg | ORAL_TABLET | Freq: Every day | ORAL | 3 refills | Status: DC

## 2016-09-27 NOTE — Assessment & Plan Note (Addendum)
Follows with cardiology,LDL improved but goal < 100,  Expect LDL to continue to improve at higher dose, has only been on for 1  Month

## 2016-09-27 NOTE — Assessment & Plan Note (Addendum)
Diabetes shows great control, advised if having any hypoglycemic episodes to take 1/2 tablet of amaryl He declines podiatry

## 2016-09-27 NOTE — Progress Notes (Signed)
   Subjective:    Patient ID: Barry [name], male    DOB: [birth year], 82 y.o.   MRN: [id number]  Patient presents for 4 month F/U (is not fasting)  Patient to follow-up chronic medical problems - labs reviewed at bedside with pt   Diabetes mellitus his recent labs show an A1c of 6.4% ,  his CBGs at home run 120-130's  , is currently on glimepiride 1 mg twice a day on ACE inhibitor, he has had sugar get down to 70 before bedtime, does not have any symptoms but will eat a snack   Hyperlipidemia- LDL is 113, improved from 134, currently on simvastatin 40 mg increased in August but he finished his previous 20mg  dose up, so has only been on 40mg  for past month -  due to higher cholesterol levels, does not tolerate lipitor or crestor   Hypertension he is taking his medicine as prescribed blood pressure is well controlled  Continue to follow with psychiatry for his anxiety  Review Of Systems:  GEN- denies fatigue, fever, weight loss,weakness, recent illness HEENT- denies eye drainage, change in vision, nasal discharge, CVS- denies chest pain, palpitations RESP- denies SOB, cough, wheeze ABD- denies N/V, change in stools, abd pain GU- denies dysuria, hematuria, dribbling, incontinence MSK- denies joint pain, muscle aches, injury Neuro- denies headache, dizziness, syncope, seizure activity       Objective:    BP 128/78 (BP Location: Left Arm, Patient Position: Sitting, Cuff Size: Large)   Pulse 80   Temp 97.7 F (36.5 C) (Oral)   Resp 14   Ht 5\' 10"  (1.778 m)   Wt 183 lb (83 kg)   SpO2 97%   BMI 26.26 kg/m  GEN- NAD, alert and oriented x3 HEENT- PERRL, EOMI, non injected sclera, pink conjunctiva, MMM, oropharynx clear Neck- Supple, no thyromegaly CVS- RRR, no murmur RESP-CTAB EXT- No edema Pulses- Radial, DP- 2+        Assessment & Plan:      Problem List Items Addressed This Visit    Hyperlipidemia - Primary    Continue zocor 40mg  and dietary changes       Relevant Medications   ramipril (ALTACE) 10 MG capsule   metoprolol succinate (TOPROL-XL) 25 MG 24 hr tablet   Essential hypertension    Controlled no change to medication       Relevant Medications   ramipril (ALTACE) 10 MG capsule   metoprolol succinate (TOPROL-XL) 25 MG 24 hr tablet   Diabetes mellitus type II, controlled ([place])    Diabetes shows great control, advised if having any hypoglycemic episodes to take 1/2 tablet of amaryl He declines podiatry       Relevant Medications   ramipril (ALTACE) 10 MG capsule   Other Relevant Orders   HM DIABETES FOOT EXAM (Completed)   CAD (coronary artery disease)    Follows with cardiology,LDL improved but goal < 100,  Expect LDL to continue to improve at higher dose, has only been on for 1  Month       Relevant Medications   ramipril (ALTACE) 10 MG capsule   metoprolol succinate (TOPROL-XL) 25 MG 24 hr tablet      Note: This dictation was prepared with Dragon dictation along with smaller phrase technology. Any transcriptional errors that result from this process are unintentional.

## 2016-09-27 NOTE — Assessment & Plan Note (Signed)
Continue zocor 40mg  and dietary changes

## 2016-09-27 NOTE — Assessment & Plan Note (Signed)
Controlled no change to medication 

## 2016-09-27 NOTE — Patient Instructions (Addendum)
F/U 4 months Take 1/2 tablet of the sugar pill if it starts dropping down  Your labs look good

## 2016-10-20 ENCOUNTER — Other Ambulatory Visit: Payer: Self-pay | Admitting: *Deleted

## 2016-10-20 NOTE — Telephone Encounter (Signed)
Received call from patient.   States that Metoprolol and Ramipril are being delivered from mail order, but will not arrive until 10/27/2016.  Reports that he is running out of medication today.   Call placed to local pharmacy [facility]). Partial fill on Metoprolol $12.18. Partial fill on Ramipril $11.74.  Patient aware and will pick up this afternoon.

## 2016-11-05 ENCOUNTER — Other Ambulatory Visit: Payer: Self-pay | Admitting: *Deleted

## 2016-11-05 MED ORDER — NIFEDIPINE ER OSMOTIC RELEASE 30 MG PO TB24: ORAL_TABLET | ORAL | 0 refills | Status: DC

## 2016-11-05 NOTE — Telephone Encounter (Signed)
Received fax requesting refill on Nifedipine.   Refill appropriate and filled per protocol.

## 2016-11-17 ENCOUNTER — Telehealth (HOSPITAL_COMMUNITY): Payer: Self-pay | Admitting: *Deleted

## 2016-11-17 NOTE — Telephone Encounter (Signed)
left voice message, provider out of office 12/06/16.

## 2016-11-22 DIAGNOSIS — C44219 Basal cell carcinoma of skin of left ear and external auricular canal: Secondary | ICD-10-CM | POA: Diagnosis not present

## 2016-11-22 DIAGNOSIS — Z85828 Personal history of other malignant neoplasm of skin: Secondary | ICD-10-CM | POA: Diagnosis not present

## 2016-11-22 DIAGNOSIS — X32XXXD Exposure to sunlight, subsequent encounter: Secondary | ICD-10-CM | POA: Diagnosis not present

## 2016-11-22 DIAGNOSIS — Z08 Encounter for follow-up examination after completed treatment for malignant neoplasm: Secondary | ICD-10-CM | POA: Diagnosis not present

## 2016-11-22 DIAGNOSIS — L570 Actinic keratosis: Secondary | ICD-10-CM | POA: Diagnosis not present

## 2016-11-22 DIAGNOSIS — C44319 Basal cell carcinoma of skin of other parts of face: Secondary | ICD-10-CM | POA: Diagnosis not present

## 2016-11-29 ENCOUNTER — Ambulatory Visit (HOSPITAL_COMMUNITY): Payer: Self-pay | Admitting: Psychiatry

## 2016-12-06 ENCOUNTER — Ambulatory Visit (HOSPITAL_COMMUNITY): Payer: Self-pay | Admitting: Psychiatry

## 2016-12-16 ENCOUNTER — Encounter (HOSPITAL_COMMUNITY): Payer: Self-pay | Admitting: Psychiatry

## 2016-12-16 ENCOUNTER — Ambulatory Visit (INDEPENDENT_AMBULATORY_CARE_PROVIDER_SITE_OTHER): Payer: Medicare Other | Admitting: Psychiatry

## 2016-12-16 VITALS — BP 159/90 | HR 84 | Ht 70.0 in | Wt 187.4 lb

## 2016-12-16 DIAGNOSIS — F418 Other specified anxiety disorders: Secondary | ICD-10-CM

## 2016-12-16 DIAGNOSIS — F5105 Insomnia due to other mental disorder: Secondary | ICD-10-CM

## 2016-12-16 DIAGNOSIS — F331 Major depressive disorder, recurrent, moderate: Secondary | ICD-10-CM

## 2016-12-16 DIAGNOSIS — Z888 Allergy status to other drugs, medicaments and biological substances status: Secondary | ICD-10-CM | POA: Diagnosis not present

## 2016-12-16 MED ORDER — LORAZEPAM 1 MG PO TABS: 1.0000 mg | ORAL_TABLET | Freq: Three times a day (TID) | ORAL | 3 refills | Status: DC

## 2016-12-16 MED ORDER — FLUOXETINE HCL 20 MG PO CAPS: 20.0000 mg | ORAL_CAPSULE | Freq: Every morning | ORAL | 3 refills | Status: DC

## 2016-12-16 MED ORDER — NORTRIPTYLINE HCL 10 MG PO CAPS: 10.0000 mg | ORAL_CAPSULE | Freq: Every day | ORAL | 3 refills | Status: DC

## 2016-12-16 NOTE — Progress Notes (Signed)
Patient ID: Barry [name], male   DOB: July 11, 1933, 83 y.o.   MRN: [id number] Patient ID: Barry [name], male   DOB: 1933-02-27, 83 y.o.   MRN: [id number] Patient ID: Barry [name], male   DOB: 26-Oct-1932, 83 y.o.   MRN: [id number] Patient ID: Barry [name], male   DOB: 1933-08-17, 83 y.o.   MRN: [id number] Patient ID: Barry [name], male   DOB: Apr 17, 1933, 83 y.o.   MRN: [id number] Patient ID: Barry [name], male   DOB: 12/17/1932, 83 y.o.   MRN: [id number] Patient ID: Barry [name], male   DOB: 1933-09-15, 83 y.o.   MRN: [id number] Patient ID: Barry [name], male   DOB: 12-16-1932, 83 y.o.   MRN: [id number] Patient ID: Barry [name], male   DOB: 08-13-1933, 83 y.o.   MRN: [id number] Patient ID: Barry [name], male   DOB: 1933/07/25, 83 y.o.   MRN: [id number] Patient ID: Barry [name], male   DOB: 04-26-1933, 83 y.o.   MRN: [id number] [facility] [id number] Progress Note Barry [name] MRN: [id number] DOB: [birth year] Age: 83 y.o.  Date: 12/16/2016 Start Time: 10:15 AM End Time: 10:30 AM  Chief Complaint: Chief Complaint  Patient presents with  . Depression  . Anxiety  . Follow-up   Subjective: I'm doing well".  This patient is an 83  year-old widowed white male who lives alone in [place]. He has 2 children and 3 grandchildren who live in [place]. 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  The patient states that he developed anxiety and depression in his 84s. His depression worsened when his wife died suddenly 9 years ago. However he is doing very well on his current medications. He denies being depressed or anxious and he is sleeping quite well. He spends most of his time with her grandchildren and really enjoys it. His social life is quite active. He has no side effects or complaints regarding his medications  The patient returns after 4 months He breaks a 1 mg Ativan in 2 half doses and takes 4 a day.  This is controlling his anxiety most of the time. He denies being depressed most the time he sleeps well although he didn't sleep too well last night. Overall he does not have any specific complaints  Current psychiatric medication Ativan 0.5 mg 4 times a day Nortriptyline 10 mg daily Prozac 20 mg daily   Vitals: BP (!) 159/90   Pulse 84   Ht '5\' 10"'  (1.778 m)   Wt 187 lb 6.4 oz (85 kg)   SpO2 93%   BMI 26.89 kg/m   Allergies: Allergies  Allergen Reactions  . Lisinopril-Hydrochlorothiazide Swelling    ZESTRIL  . Doxycycline Rash   Medical History: Past Medical History:  Diagnosis Date  . AAA (abdominal aortic aneurysm) (HCC) 3-09   4.7 cm  . Allergy    rhinitis  . Anxiety   . Cancer ([place])    skin  . Cataract   . Depression   . Diabetes mellitus   . Diverticulosis   . GERD (gastroesophageal reflux disease)   . Hepatitis   . Hx of colonic polyps 08/26/2015  . Hyperlipidemia   . Hypertension   . Nephrolithiasis   . Personal history of colonic polyps    adenomas, serrated also  . PVD (peripheral vascular disease) ([place])   . Tobacco abuse   Patient has history of diabetes mellitus, hypertension, peripheral vascular disease, abdominal  aortic aneurysm, diverticulosis, GERD and hyperlipidemia.  He sees Dr. Timoteo Gaul regularly and will be going back soon for blood work.   Surgical History: Past Surgical History:  Procedure Laterality Date  . COLONOSCOPY  2006, 2008, 2010, 05/27/2011   numerous adenomas - 13 in 2006, 3, 2008, 2 2012 (up to 1 cm), 4 diminutive adenomas, serrated adenomas 2012. Diverticulosis and hemorrhoids also.  Marland Kitchen EYE SURGERY    . EYE SURGERY Left   . Kidney stone operation    . lower limb amputation, other toe 5th    . percutaneous stent graft repair of infrarenal AAA  11/10   5.6 cm (T. early)  . surgical excision basal cell carcinoma    . tympanic eardrum repair    . VASECTOMY     Family History: family history includes Cancer in his brother and  brother; Depression in his brother and sister; Diabetes in his sister; Emphysema in his father; Heart disease in his mother; Kidney disease in his sister; Learning disabilities in his sister; Lung disease in his sister; Mental retardation in his sister. Reviewed and the history of his sisters was clarified and the passing of [name] recorded.  Mental status examination Patient is well groomed well dressed.  He is pleasant.  He maintained good eye contact. His speech is soft clear and coherent. His thought process is logical linear and goal-directed. He denies any active or passive suicidal thoughts or homicidal thoughts.  He denies any auditory or visual hallucination.  He described his mood as good and his affect is mood congruent. There no psychotic symptoms present. He's alert and oriented x3. His insight judgment and impulse control is okay. His memory functions speech and fund of knowledge are all good  Lab Results:  Results for orders placed or performed in visit on 09/24/16 (from the past 8736 hour(s))  CBC with Differential/Platelet   Collection Time: 09/24/16  8:20 AM  Result Value Ref Range   WBC 5.9 3.8 - 10.8 K/uL   RBC 5.02 4.20 - 5.80 MIL/uL   Hemoglobin 16.1 13.0 - 17.0 g/dL   HCT 49.1 38.5 - 50.0 %   MCV 97.8 80.0 - 100.0 fL   MCH 32.1 27.0 - 33.0 pg   MCHC 32.8 32.0 - 36.0 g/dL   RDW 14.0 11.0 - 15.0 %   Platelets 240 140 - 400 K/uL   MPV 9.7 7.5 - 12.5 fL   Neutro Abs 3,186 1,500 - 7,800 cells/uL   Lymphs Abs 1,593 850 - 3,900 cells/uL   Monocytes Absolute 885 200 - 950 cells/uL   Eosinophils Absolute 177 15 - 500 cells/uL   Basophils Absolute 59 0 - 200 cells/uL   Neutrophils Relative % 54 %   Lymphocytes Relative 27 %   Monocytes Relative 15 %   Eosinophils Relative 3 %   Basophils Relative 1 %   Smear Review Criteria for review not met   COMPLETE METABOLIC PANEL WITH GFR   Collection Time: 09/24/16  8:20 AM  Result Value Ref Range   Sodium 137 135 - 146 mmol/L    Potassium 4.4 3.5 - 5.3 mmol/L   Chloride 102 98 - 110 mmol/L   CO2 31 20 - 31 mmol/L   Glucose, Bld 123 (H) 70 - 99 mg/dL   BUN 22 7 - 25 mg/dL   Creat 0.91 0.70 - 1.11 mg/dL   Total Bilirubin 0.3 0.2 - 1.2 mg/dL   Alkaline Phosphatase 39 (L) 40 - 115 U/L   AST 19  10 - 35 U/L   ALT 14 9 - 46 U/L   Total Protein 6.3 6.1 - 8.1 g/dL   Albumin 3.7 3.6 - 5.1 g/dL   Calcium 9.0 8.6 - 10.3 mg/dL   GFR, Est African American >89 >=60 mL/min   GFR, Est Non African American 79 >=60 mL/min  Lipid panel   Collection Time: 09/24/16  8:20 AM  Result Value Ref Range   Cholesterol 176 <200 mg/dL   Triglycerides 99 <150 mg/dL   HDL 43 >40 mg/dL   Total CHOL/HDL Ratio 4.1 <5.0 Ratio   VLDL 20 <30 mg/dL   LDL Cholesterol 113 (H) <100 mg/dL  Hemoglobin A1c   Collection Time: 09/24/16  8:20 AM  Result Value Ref Range   Hgb A1c MFr Bld 6.4 (H) <5.7 %   Mean Plasma Glucose 137 mg/dL  PSA   Collection Time: 09/24/16  8:20 AM  Result Value Ref Range   PSA 0.4 <=4.0 ng/mL  Microalbumin/Creatinine Ratio, Urine   Collection Time: 09/24/16  9:01 AM  Result Value Ref Range   Creatinine, Urine 145 20 - 370 mg/dL   Microalb, Ur 15.0 Not estab mg/dL   Microalb Creat Ratio 103 (H) <30 mcg/mg creat  Results for orders placed or performed in visit on 05/27/16 (from the past 8736 hour(s))  CBC with Differential/Platelet   Collection Time: 05/27/16 10:43 AM  Result Value Ref Range   WBC 7.1 3.8 - 10.8 K/uL   RBC 5.22 4.20 - 5.80 MIL/uL   Hemoglobin 16.7 13.0 - 17.0 g/dL   HCT 49.4 38.5 - 50.0 %   MCV 94.6 80.0 - 100.0 fL   MCH 32.0 27.0 - 33.0 pg   MCHC 33.8 32.0 - 36.0 g/dL   RDW 14.3 11.0 - 15.0 %   Platelets 253 140 - 400 K/uL   MPV 10.1 7.5 - 12.5 fL   Neutro Abs 4,331 1,500 - 7,800 cells/uL   Lymphs Abs 1,846 850 - 3,900 cells/uL   Monocytes Absolute 781 200 - 950 cells/uL   Eosinophils Absolute 71 15 - 500 cells/uL   Basophils Absolute 71 0 - 200 cells/uL   Neutrophils Relative % 61 %    Lymphocytes Relative 26 %   Monocytes Relative 11 %   Eosinophils Relative 1 %   Basophils Relative 1 %   Smear Review Criteria for review not met   COMPLETE METABOLIC PANEL WITH GFR   Collection Time: 05/27/16 10:43 AM  Result Value Ref Range   Sodium 141 135 - 146 mmol/L   Potassium 4.7 3.5 - 5.3 mmol/L   Chloride 102 98 - 110 mmol/L   CO2 29 20 - 31 mmol/L   Glucose, Bld 138 (H) 70 - 99 mg/dL   BUN 20 7 - 25 mg/dL   Creat 1.06 0.70 - 1.11 mg/dL   Total Bilirubin 0.4 0.2 - 1.2 mg/dL   Alkaline Phosphatase 46 40 - 115 U/L   AST 17 10 - 35 U/L   ALT 14 9 - 46 U/L   Total Protein 6.8 6.1 - 8.1 g/dL   Albumin 4.1 3.6 - 5.1 g/dL   Calcium 9.5 8.6 - 10.3 mg/dL   GFR, Est African American 76 >=60 mL/min   GFR, Est Non African American 66 >=60 mL/min  Lipid panel   Collection Time: 05/27/16 10:43 AM  Result Value Ref Range   Cholesterol 210 (H) 125 - 200 mg/dL   Triglycerides 132 <150 mg/dL   HDL 50 >=40 mg/dL  Total CHOL/HDL Ratio 4.2 <=5.0 Ratio   VLDL 26 <30 mg/dL   LDL Cholesterol 134 (H) <130 mg/dL  Hemoglobin A1c   Collection Time: 05/27/16 10:43 AM  Result Value Ref Range   Hgb A1c MFr Bld 6.5 (H) <5.7 %   Mean Plasma Glucose 140 mg/dL  PSA   Collection Time: 05/27/16 10:43 AM  Result Value Ref Range   PSA 0.3 <=4.0 ng/mL  Microalbumin/Creatinine Ratio, Urine   Collection Time: 05/27/16 10:44 AM  Result Value Ref Range   Creatinine, Urine 190 20 - 370 mg/dL   Microalb, Ur 18.1 Not estab mg/dL   Microalb Creat Ratio 95 (H) <30 mcg/mg creat  Results for orders placed or performed in visit on 01/20/16 (from the past 8736 hour(s))  COMPLETE METABOLIC PANEL WITH GFR   Collection Time: 01/20/16  8:16 AM  Result Value Ref Range   Sodium 138 135 - 146 mmol/L   Potassium 4.4 3.5 - 5.3 mmol/L   Chloride 102 98 - 110 mmol/L   CO2 30 20 - 31 mmol/L   Glucose, Bld 136 (H) 70 - 99 mg/dL   BUN 23 7 - 25 mg/dL   Creat 0.99 0.70 - 1.11 mg/dL   Total Bilirubin 0.4 0.2 - 1.2  mg/dL   Alkaline Phosphatase 44 40 - 115 U/L   AST 16 10 - 35 U/L   ALT 10 9 - 46 U/L   Total Protein 6.2 6.1 - 8.1 g/dL   Albumin 3.7 3.6 - 5.1 g/dL   Calcium 8.8 8.6 - 10.3 mg/dL   GFR, Est African American 83 >=60 mL/min   GFR, Est Non African American 72 >=60 mL/min  Lipid panel   Collection Time: 01/20/16  8:16 AM  Result Value Ref Range   Cholesterol 164 125 - 200 mg/dL   Triglycerides 85 <150 mg/dL   HDL 47 >=40 mg/dL   Total CHOL/HDL Ratio 3.5 <=5.0 Ratio   VLDL 17 <30 mg/dL   LDL Cholesterol 100 <130 mg/dL  CBC with Differential/Platelet   Collection Time: 01/20/16  8:16 AM  Result Value Ref Range   WBC 6.3 3.8 - 10.8 K/uL   RBC 5.17 4.20 - 5.80 MIL/uL   Hemoglobin 16.5 13.0 - 17.0 g/dL   HCT 48.7 38.5 - 50.0 %   MCV 94.2 80.0 - 100.0 fL   MCH 31.9 27.0 - 33.0 pg   MCHC 33.9 32.0 - 36.0 g/dL   RDW 14.1 11.0 - 15.0 %   Platelets 224 140 - 400 K/uL   MPV 10.2 7.5 - 12.5 fL   Neutro Abs 3,402 1,500 - 7,800 cells/uL   Lymphs Abs 1,890 850 - 3,900 cells/uL   Monocytes Absolute 819 200 - 950 cells/uL   Eosinophils Absolute 126 15 - 500 cells/uL   Basophils Absolute 63 0 - 200 cells/uL   Neutrophils Relative % 54 %   Lymphocytes Relative 30 %   Monocytes Relative 13 %   Eosinophils Relative 2 %   Basophils Relative 1 %   Smear Review Criteria for review not met   Hemoglobin A1c   Collection Time: 01/20/16  8:16 AM  Result Value Ref Range   Hgb A1c MFr Bld 7.0 (H) <5.7 %   Mean Plasma Glucose 154 mg/dL  PCP draws routine labs and nothing is emerging as of concern.  Assessment Axis I Depressive disorder NOS Axis II deferred Axis III see medical history Axis IV mild to moderate Axis V 65-70  Plan/Discussion: I took his  vitals.  I reviewed CC, tobacco/med/surg Hx, meds effects/ side effects, problem list, therapies and responses as well as current situation/symptoms discussed options. Continue prozac and nortriptyline for depression and lorazepam for anxiety  He'll return in 4 months See orders and pt instructions for more details.  MEDICATIONS this encounter: Meds ordered this encounter  Medications  . FLUoxetine (PROZAC) 20 MG capsule    Sig: Take 1 capsule (20 mg total) by mouth every morning.    Dispense:  90 capsule    Refill:  3  . nortriptyline (PAMELOR) 10 MG capsule    Sig: Take 1 capsule (10 mg total) by mouth at bedtime.    Dispense:  90 capsule    Refill:  3  . LORazepam (ATIVAN) 1 MG tablet    Sig: Take 1 tablet (1 mg total) by mouth 3 (three) times daily.    Dispense:  90 tablet    Refill:  3    Medical Decision Making Problem Points:  Established problem, stable/improving (1), Review of last therapy session (1) and Review of psycho-social stressors (1) Data Points:  Review or order clinical lab tests (1) Review of medication regiment & side effects (2)  I certify that outpatient services furnished can reasonably be expected to improve the patient's condition.   [name], MD

## 2017-01-21 DIAGNOSIS — J329 Chronic sinusitis, unspecified: Secondary | ICD-10-CM | POA: Diagnosis not present

## 2017-01-21 DIAGNOSIS — J069 Acute upper respiratory infection, unspecified: Secondary | ICD-10-CM | POA: Diagnosis not present

## 2017-01-27 ENCOUNTER — Other Ambulatory Visit: Payer: Medicare Other

## 2017-01-27 DIAGNOSIS — Z125 Encounter for screening for malignant neoplasm of prostate: Secondary | ICD-10-CM

## 2017-01-27 DIAGNOSIS — I10 Essential (primary) hypertension: Secondary | ICD-10-CM | POA: Diagnosis not present

## 2017-01-27 DIAGNOSIS — E785 Hyperlipidemia, unspecified: Secondary | ICD-10-CM | POA: Diagnosis not present

## 2017-01-27 DIAGNOSIS — Z0000 Encounter for general adult medical examination without abnormal findings: Secondary | ICD-10-CM | POA: Diagnosis not present

## 2017-01-27 DIAGNOSIS — E119 Type 2 diabetes mellitus without complications: Secondary | ICD-10-CM | POA: Diagnosis not present

## 2017-01-27 LAB — CBC WITH DIFFERENTIAL/PLATELET
BASOS ABS: 66 {cells}/uL (ref 0–200)
Basophils Relative: 1 %
Eosinophils Absolute: 132 cells/uL (ref 15–500)
Eosinophils Relative: 2 %
HEMATOCRIT: 47.8 % (ref 38.5–50.0)
Hemoglobin: 16 g/dL (ref 13.0–17.0)
Lymphocytes Relative: 35 %
Lymphs Abs: 2310 cells/uL (ref 850–3900)
MCH: 31.5 pg (ref 27.0–33.0)
MCHC: 33.5 g/dL (ref 32.0–36.0)
MCV: 94.1 fL (ref 80.0–100.0)
MONO ABS: 792 {cells}/uL (ref 200–950)
MPV: 10.1 fL (ref 7.5–12.5)
Monocytes Relative: 12 %
NEUTROS PCT: 50 %
Neutro Abs: 3300 cells/uL (ref 1500–7800)
Platelets: 228 10*3/uL (ref 140–400)
RBC: 5.08 MIL/uL (ref 4.20–5.80)
RDW: 14.2 % (ref 11.0–15.0)
WBC: 6.6 10*3/uL (ref 3.8–10.8)

## 2017-01-28 ENCOUNTER — Ambulatory Visit (INDEPENDENT_AMBULATORY_CARE_PROVIDER_SITE_OTHER): Payer: Medicare Other | Admitting: Family Medicine

## 2017-01-28 ENCOUNTER — Encounter: Payer: Self-pay | Admitting: Family Medicine

## 2017-01-28 VITALS — BP 138/82 | HR 86 | Temp 98.2°F | Resp 14 | Ht 70.0 in | Wt 187.0 lb

## 2017-01-28 DIAGNOSIS — F17200 Nicotine dependence, unspecified, uncomplicated: Secondary | ICD-10-CM | POA: Diagnosis not present

## 2017-01-28 DIAGNOSIS — I2510 Atherosclerotic heart disease of native coronary artery without angina pectoris: Secondary | ICD-10-CM | POA: Diagnosis not present

## 2017-01-28 DIAGNOSIS — I2583 Coronary atherosclerosis due to lipid rich plaque: Secondary | ICD-10-CM | POA: Diagnosis not present

## 2017-01-28 DIAGNOSIS — E782 Mixed hyperlipidemia: Secondary | ICD-10-CM | POA: Diagnosis not present

## 2017-01-28 DIAGNOSIS — E119 Type 2 diabetes mellitus without complications: Secondary | ICD-10-CM | POA: Diagnosis not present

## 2017-01-28 DIAGNOSIS — I10 Essential (primary) hypertension: Secondary | ICD-10-CM | POA: Diagnosis not present

## 2017-01-28 LAB — COMPLETE METABOLIC PANEL WITH GFR
ALBUMIN: 3.4 g/dL — AB (ref 3.6–5.1)
ALK PHOS: 46 U/L (ref 40–115)
ALT: 11 U/L (ref 9–46)
AST: 17 U/L (ref 10–35)
BUN: 17 mg/dL (ref 7–25)
CALCIUM: 9.1 mg/dL (ref 8.6–10.3)
CO2: 28 mmol/L (ref 20–31)
CREATININE: 1.02 mg/dL (ref 0.70–1.11)
Chloride: 103 mmol/L (ref 98–110)
GFR, Est African American: 79 mL/min (ref >=60)
GFR, Est Non African American: 69 mL/min (ref >=60)
Glucose, Bld: 133 mg/dL — ABNORMAL HIGH (ref 70–99)
Potassium: 4.4 mmol/L (ref 3.5–5.3)
Sodium: 138 mmol/L (ref 135–146)
Total Bilirubin: 0.4 mg/dL (ref 0.2–1.2)
Total Protein: 6.1 g/dL (ref 6.1–8.1)

## 2017-01-28 LAB — LIPID PANEL
CHOLESTEROL: 162 mg/dL (ref <200)
HDL: 45 mg/dL (ref >40)
LDL Cholesterol: 93 mg/dL (ref <100)
Total CHOL/HDL Ratio: 3.6 Ratio (ref <5.0)
Triglycerides: 122 mg/dL (ref <150)
VLDL: 24 mg/dL (ref <30)

## 2017-01-28 LAB — HEMOGLOBIN A1C
HEMOGLOBIN A1C: 6.6 % — AB (ref <5.7)
MEAN PLASMA GLUCOSE: 143 mg/dL

## 2017-01-28 LAB — MICROALBUMIN / CREATININE URINE RATIO
CREATININE, URINE: 128 mg/dL (ref 20–370)
Microalb Creat Ratio: 195 mcg/mg creat — ABNORMAL HIGH (ref <30)
Microalb, Ur: 24.9 mg/dL

## 2017-01-28 NOTE — Assessment & Plan Note (Signed)
At goal, no change to zocor

## 2017-01-28 NOTE — Assessment & Plan Note (Signed)
Well controlled, continue amarly, no hypoglyemia symptoms

## 2017-01-28 NOTE — Patient Instructions (Signed)
F/U 4 months for Physical  

## 2017-01-28 NOTE — Assessment & Plan Note (Addendum)
Risk factors controlled for worsening disease except smoking  f/u cardiology  On ASA

## 2017-01-28 NOTE — Progress Notes (Signed)
   Subjective:    Patient ID: Barry [name], male    DOB: 16-Aug-1933, 83 y.o.   MRN: [id number]  Patient presents for 4 month F/U (is not fasting) Patient here to follow-up his chronic medical problems. Medications reviewed. His fasting labs recently done reviewed  Diabetes mellitus his A1c resulted at 6.6% he is taking glimepiride 1mg  twice a day as described. His fasting blood sugars typically are around 120's, no hypoglycemia   Hypertension- he is taking his blood pressure medications as prescribed no difficulties or side effects  I will continue his cholesterol is now at goal his LDL is 93 he is taking simvastatin 40 mg once a day  Note he is still following with behavioral health they have him on lorazepam and prozac   Was treated at urgent care for sinusitis, treated with zpak , stop taking allergy pill felt like heart was beating fast   Feels well, able to do all of his activties  Review Of Systems:  GEN- denies fatigue, fever, weight loss,weakness, recent illness HEENT- denies eye drainage, change in vision, nasal discharge, CVS- denies chest pain, palpitations RESP- denies SOB, cough, wheeze ABD- denies N/V, change in stools, abd pain GU- denies dysuria, hematuria, dribbling, incontinence MSK- denies joint pain, muscle aches, injury Neuro- denies headache, dizziness, syncope, seizure activity       Objective:    BP 138/82   Pulse 86   Temp 98.2 F (36.8 C) (Oral)   Resp 14   Ht 5\' 10"  (1.778 m)   Wt 187 lb (84.8 kg)   SpO2 97%   BMI 26.83 kg/m  GEN- NAD, alert and oriented x3 HEENT- PERRL, EOMI, non injected sclera, pink conjunctiva, MMM, oropharynx clear, TM clear no effusion, nares clear CVS- RRR, soft systolic murmur RSB RESP-CTAB EXT- No edema Pulses- Radial, DP- 2+        Assessment & Plan:      Problem List Items Addressed This Visit    TOBACCO ABUSE   Hyperlipidemia    At goal, no change to zocor      Essential hypertension - Primary     Controlled no changes       Diabetes mellitus type II, controlled ([place])    Well controlled, continue amarly, no hypoglyemia symptoms       CAD (coronary artery disease)    Risk factors controlled for worsening disease except smoking  f/u cardiology  On ASA         Note: This dictation was prepared with Dragon dictation along with smaller phrase technology. Any transcriptional errors that result from this process are unintentional.

## 2017-01-28 NOTE — Assessment & Plan Note (Signed)
Controlled no changes 

## 2017-02-03 ENCOUNTER — Other Ambulatory Visit: Payer: Self-pay | Admitting: *Deleted

## 2017-02-03 MED ORDER — NIFEDIPINE ER OSMOTIC RELEASE 30 MG PO TB24: ORAL_TABLET | ORAL | 0 refills | Status: DC

## 2017-02-10 NOTE — Progress Notes (Signed)
HPI: FU CAD. Patient underwent cardiac catheterization in 2005; he was found to have nonobstructive disease other than a 90% stenosis in a small branch of the right coronary artery. He was noted to have an 80% iliac stenosis and an abdominal aortic aneurysm. Procedure complicated by peripheral emboli requiring amputation of a digit. Patient has had stent graft of abdominal aortic aneurysm. Since last seen, he has dyspnea with more extreme activities. No orthopnea, PND, pedal edema, chest pain or syncope.  Current Outpatient Prescriptions  Medication Sig Dispense Refill  . aspirin EC 81 MG tablet Take 81 mg by mouth every morning.    Marland Kitchen FLUoxetine (PROZAC) 20 MG capsule Take 1 capsule (20 mg total) by mouth every morning. 90 capsule 3  . glimepiride (AMARYL) 1 MG tablet TAKE 1 BY MOUTH TWICE DAILY 180 tablet 3  . hydrochlorothiazide (HYDRODIURIL) 25 MG tablet Take 1 tablet (25 mg total) by mouth daily. 90 tablet 3  . LORazepam (ATIVAN) 1 MG tablet Take 1 tablet (1 mg total) by mouth 3 (three) times daily. 90 tablet 3  . NIFEdipine (NIFEDICAL XL) 30 MG 24 hr tablet TAKE 1 BY MOUTH DAILY 90 tablet 0  . nortriptyline (PAMELOR) 10 MG capsule Take 1 capsule (10 mg total) by mouth at bedtime. 90 capsule 3  . ramipril (ALTACE) 10 MG capsule TAKE 1 BY MOUTH DAILY 90 capsule 1  . simvastatin (ZOCOR) 40 MG tablet Take 1 tablet (40 mg total) by mouth at bedtime. 90 tablet 3   No current facility-administered medications for this visit.      Past Medical History:  Diagnosis Date  . AAA (abdominal aortic aneurysm) (HCC) 3-09   4.7 cm  . Allergy    rhinitis  . Anxiety   . Cancer ([place])    skin  . Cataract   . Depression   . Diabetes mellitus   . Diverticulosis   . GERD (gastroesophageal reflux disease)   . Hepatitis   . Hx of colonic polyps 08/26/2015  . Hyperlipidemia   . Hypertension   . Nephrolithiasis   . Personal history of colonic polyps    adenomas, serrated also  . PVD  (peripheral vascular disease) ([place])   . Tobacco abuse     Past Surgical History:  Procedure Laterality Date  . COLONOSCOPY  2006, 2008, 2010, 05/27/2011   numerous adenomas - 13 in 2006, 3, 2008, 2 2012 (up to 1 cm), 4 diminutive adenomas, serrated adenomas 2012. Diverticulosis and hemorrhoids also.  Marland Kitchen EYE SURGERY    . EYE SURGERY Left   . Kidney stone operation    . lower limb amputation, other toe 5th    . percutaneous stent graft repair of infrarenal AAA  11/10   5.6 cm (T. early)  . surgical excision basal cell carcinoma    . tympanic eardrum repair    . VASECTOMY      Social History   Social History  . Marital status: Widowed    Spouse name: N/A  . Number of children: 2  . Years of education: N/A   Occupational History  .  Retired   Social History Main Topics  . Smoking status: Current Every Day Smoker    Packs/day: 1.00    Years: 60.00    Types: Cigarettes  . Smokeless tobacco: Never Used     Comment: smokes about 16-20 cigs a day as of 04/04/2013  . Alcohol use No  . Drug use: No  . Sexual activity:  Yes    Partners: Female    Birth control/ protection: Surgical     Comment: vasectomy   Other Topics Concern  . 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 retired 51 years - keeping busy. Lives alone. 1- step-daughter, 1 son - '68; 3 grandchildren. does odd-jobs, yard work    Family History  Problem Relation Age of Onset  . Emphysema Father   . Cancer Brother     Lung  . Depression Brother   . Depression Sister   . Lung disease Sister     Fibrosis and died from pneumonia on 04/21/2013  . Kidney disease Sister   . Diabetes Sister   . Learning disabilities Sister   . Mental retardation Sister   . Heart disease Mother   . Cancer Brother   . Colon cancer Neg Hx   . Dementia Neg Hx   . Alcohol abuse Neg Hx   . Drug abuse Neg Hx   . Paranoid behavior Neg Hx   . Schizophrenia Neg Hx   . Anxiety disorder Neg Hx   .  Bipolar disorder Neg Hx   . OCD Neg Hx   . Sexual abuse Neg Hx   . Physical abuse Neg Hx   . Seizures Neg Hx   . Esophageal cancer Neg Hx   . Stomach cancer Neg Hx   . Rectal cancer Neg Hx     ROS: no fevers or chills, productive cough, hemoptysis, dysphasia, odynophagia, melena, hematochezia, dysuria, hematuria, rash, seizure activity, orthopnea, PND, pedal edema, claudication. Remaining systems are negative.  Physical Exam: Well-developed well-nourished in no acute distress.  Skin is warm and dry.  HEENT is normal.  Neck is supple. No bruits. Chest is clear to auscultation with normal expansion.  Cardiovascular exam is regular rate and rhythm. No murmurs. Abdominal exam nontender or distended. No masses palpated. Extremities show no edema. neuro grossly intact  ECG- Sinus rhythm at a rate of 63. Nonspecific ST changes. personally reviewed  A/P  1 Coronary artery disease-continue aspirin and statin.  2 hypertension-blood pressure controlled. Continue present medications.  3 hyperlipidemia-recent LDL 93. Continue statin. He did not tolerate Lipitor or Crestor previously because of myalgias. Add Zetia 10 mg daily. Check lipids and liver in 4 weeks.   4 tobacco abuse-patient counseled on discontinuing.  5 history of abdominal aortic aneurysm repair-followed by vascular surgery.  Kirk Ruths, MD

## 2017-02-17 ENCOUNTER — Other Ambulatory Visit: Payer: Self-pay | Admitting: *Deleted

## 2017-02-17 ENCOUNTER — Encounter: Payer: Self-pay | Admitting: Cardiology

## 2017-02-17 ENCOUNTER — Ambulatory Visit (INDEPENDENT_AMBULATORY_CARE_PROVIDER_SITE_OTHER): Payer: Medicare Other | Admitting: Cardiology

## 2017-02-17 VITALS — BP 140/70 | HR 62 | Ht 70.0 in | Wt 183.0 lb

## 2017-02-17 DIAGNOSIS — F17200 Nicotine dependence, unspecified, uncomplicated: Secondary | ICD-10-CM | POA: Diagnosis not present

## 2017-02-17 DIAGNOSIS — I10 Essential (primary) hypertension: Secondary | ICD-10-CM | POA: Diagnosis not present

## 2017-02-17 DIAGNOSIS — E7800 Pure hypercholesterolemia, unspecified: Secondary | ICD-10-CM | POA: Diagnosis not present

## 2017-02-17 DIAGNOSIS — I2510 Atherosclerotic heart disease of native coronary artery without angina pectoris: Secondary | ICD-10-CM

## 2017-02-17 DIAGNOSIS — I2583 Coronary atherosclerosis due to lipid rich plaque: Secondary | ICD-10-CM

## 2017-02-17 MED ORDER — GLIMEPIRIDE 1 MG PO TABS: ORAL_TABLET | ORAL | 3 refills | Status: DC

## 2017-02-17 MED ORDER — EZETIMIBE 10 MG PO TABS: 10.0000 mg | ORAL_TABLET | Freq: Every day | ORAL | 3 refills | Status: DC

## 2017-02-17 NOTE — Patient Instructions (Signed)
Medication Instructions:   START ZETIA 10 MG ONCE DAILY  Labwork:  Your physician recommends that you return for lab work in: [place] TO LAB WORK  Follow-Up:  Your physician wants you to follow-up in: [place] will receive a reminder letter in the mail two months in advance. If you don't receive a letter, please call our office to schedule the follow-up appointment.   If you need a refill on your cardiac medications before your next appointment, please call your pharmacy.

## 2017-03-24 ENCOUNTER — Other Ambulatory Visit: Payer: Self-pay | Admitting: *Deleted

## 2017-03-24 MED ORDER — RAMIPRIL 10 MG PO CAPS: ORAL_CAPSULE | ORAL | 1 refills | Status: DC

## 2017-04-05 ENCOUNTER — Ambulatory Visit (INDEPENDENT_AMBULATORY_CARE_PROVIDER_SITE_OTHER): Payer: Medicare Other | Admitting: Psychiatry

## 2017-04-05 ENCOUNTER — Encounter (HOSPITAL_COMMUNITY): Payer: Self-pay | Admitting: Psychiatry

## 2017-04-05 VITALS — BP 142/78 | HR 67 | Ht 70.0 in | Wt 184.0 lb

## 2017-04-05 DIAGNOSIS — Z810 Family history of intellectual disabilities: Secondary | ICD-10-CM | POA: Diagnosis not present

## 2017-04-05 DIAGNOSIS — I2583 Coronary atherosclerosis due to lipid rich plaque: Secondary | ICD-10-CM

## 2017-04-05 DIAGNOSIS — F331 Major depressive disorder, recurrent, moderate: Secondary | ICD-10-CM

## 2017-04-05 DIAGNOSIS — F5105 Insomnia due to other mental disorder: Secondary | ICD-10-CM | POA: Diagnosis not present

## 2017-04-05 DIAGNOSIS — F418 Other specified anxiety disorders: Secondary | ICD-10-CM | POA: Diagnosis not present

## 2017-04-05 DIAGNOSIS — I2510 Atherosclerotic heart disease of native coronary artery without angina pectoris: Secondary | ICD-10-CM

## 2017-04-05 MED ORDER — NORTRIPTYLINE HCL 10 MG PO CAPS: 10.0000 mg | ORAL_CAPSULE | Freq: Every day | ORAL | 3 refills | Status: DC

## 2017-04-05 MED ORDER — LORAZEPAM 1 MG PO TABS: 1.0000 mg | ORAL_TABLET | Freq: Three times a day (TID) | ORAL | 3 refills | Status: DC

## 2017-04-05 MED ORDER — FLUOXETINE HCL 20 MG PO CAPS: 20.0000 mg | ORAL_CAPSULE | Freq: Every morning | ORAL | 3 refills | Status: DC
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
## 2017-04-11 ENCOUNTER — Ambulatory Visit (HOSPITAL_COMMUNITY): Payer: Self-pay | Admitting: Psychiatry

## 2017-04-11 ENCOUNTER — Encounter: Payer: Self-pay | Admitting: *Deleted

## 2017-04-14 DIAGNOSIS — E119 Type 2 diabetes mellitus without complications: Secondary | ICD-10-CM | POA: Diagnosis not present

## 2017-04-14 LAB — HM DIABETES EYE EXAM

## 2017-04-18 DIAGNOSIS — I2510 Atherosclerotic heart disease of native coronary artery without angina pectoris: Secondary | ICD-10-CM | POA: Diagnosis not present

## 2017-04-19 ENCOUNTER — Encounter: Payer: Self-pay | Admitting: *Deleted

## 2017-04-19 LAB — LIPID PANEL
Cholesterol: 110 mg/dL (ref <200)
HDL: 47 mg/dL (ref >40)
LDL CALC: 44 mg/dL (ref <100)
Total CHOL/HDL Ratio: 2.3 Ratio (ref <5.0)
Triglycerides: 94 mg/dL (ref <150)
VLDL: 19 mg/dL (ref <30)

## 2017-04-19 LAB — HEPATIC FUNCTION PANEL
ALBUMIN: 3.7 g/dL (ref 3.6–5.1)
ALK PHOS: 44 U/L (ref 40–115)
ALT: 13 U/L (ref 9–46)
AST: 18 U/L (ref 10–35)
BILIRUBIN TOTAL: 0.4 mg/dL (ref 0.2–1.2)
Bilirubin, Direct: 0.1 mg/dL (ref <=0.2)
Indirect Bilirubin: 0.3 mg/dL (ref 0.2–1.2)
Total Protein: 6.3 g/dL (ref 6.1–8.1)

## 2017-04-27 ENCOUNTER — Other Ambulatory Visit: Payer: Self-pay | Admitting: *Deleted

## 2017-04-27 MED ORDER — NIFEDIPINE ER OSMOTIC RELEASE 30 MG PO TB24: ORAL_TABLET | ORAL | 1 refills | Status: DC

## 2017-04-28 ENCOUNTER — Other Ambulatory Visit: Payer: Self-pay | Admitting: *Deleted

## 2017-04-28 MED ORDER — NIFEDIPINE ER OSMOTIC RELEASE 30 MG PO TB24: ORAL_TABLET | ORAL | 1 refills | Status: DC

## 2017-05-05 ENCOUNTER — Encounter: Payer: Self-pay | Admitting: *Deleted

## 2017-05-26 ENCOUNTER — Other Ambulatory Visit: Payer: Medicare Other

## 2017-05-26 DIAGNOSIS — E119 Type 2 diabetes mellitus without complications: Secondary | ICD-10-CM

## 2017-05-26 DIAGNOSIS — I10 Essential (primary) hypertension: Secondary | ICD-10-CM

## 2017-05-26 DIAGNOSIS — E785 Hyperlipidemia, unspecified: Secondary | ICD-10-CM | POA: Diagnosis not present

## 2017-05-26 DIAGNOSIS — Z125 Encounter for screening for malignant neoplasm of prostate: Secondary | ICD-10-CM | POA: Diagnosis not present

## 2017-05-26 DIAGNOSIS — Z0000 Encounter for general adult medical examination without abnormal findings: Secondary | ICD-10-CM | POA: Diagnosis not present

## 2017-05-26 LAB — CBC WITH DIFFERENTIAL/PLATELET
BASOS PCT: 1 %
Basophils Absolute: 63 cells/uL (ref 0–200)
Eosinophils Absolute: 189 cells/uL (ref 15–500)
Eosinophils Relative: 3 %
HCT: 47 % (ref 38.5–50.0)
HEMOGLOBIN: 15.3 g/dL (ref 13.0–17.0)
LYMPHS ABS: 2079 {cells}/uL (ref 850–3900)
Lymphocytes Relative: 33 %
MCH: 31.7 pg (ref 27.0–33.0)
MCHC: 32.6 g/dL (ref 32.0–36.0)
MCV: 97.5 fL (ref 80.0–100.0)
MONO ABS: 882 {cells}/uL (ref 200–950)
MPV: 10 fL (ref 7.5–12.5)
Monocytes Relative: 14 %
Neutro Abs: 3087 cells/uL (ref 1500–7800)
Neutrophils Relative %: 49 %
Platelets: 214 10*3/uL (ref 140–400)
RBC: 4.82 MIL/uL (ref 4.20–5.80)
RDW: 14.7 % (ref 11.0–15.0)
WBC: 6.3 10*3/uL (ref 3.8–10.8)

## 2017-05-27 LAB — COMPLETE METABOLIC PANEL WITH GFR
ALBUMIN: 3.6 g/dL (ref 3.6–5.1)
ALK PHOS: 40 U/L (ref 40–115)
ALT: 12 U/L (ref 9–46)
AST: 16 U/L (ref 10–35)
BILIRUBIN TOTAL: 0.3 mg/dL (ref 0.2–1.2)
BUN: 29 mg/dL — ABNORMAL HIGH (ref 7–25)
CALCIUM: 8.8 mg/dL (ref 8.6–10.3)
CHLORIDE: 104 mmol/L (ref 98–110)
CO2: 26 mmol/L (ref 20–32)
CREATININE: 1.17 mg/dL — AB (ref 0.70–1.11)
GFR, Est African American: 67 mL/min (ref >=60)
GFR, Est Non African American: 58 mL/min — ABNORMAL LOW (ref >=60)
Glucose, Bld: 142 mg/dL — ABNORMAL HIGH (ref 70–99)
Potassium: 4.4 mmol/L (ref 3.5–5.3)
Sodium: 140 mmol/L (ref 135–146)
TOTAL PROTEIN: 5.9 g/dL — AB (ref 6.1–8.1)

## 2017-05-27 LAB — MICROALBUMIN / CREATININE URINE RATIO
CREATININE, URINE: 206 mg/dL (ref 20–370)
MICROALB UR: 23.6 mg/dL
Microalb Creat Ratio: 115 mcg/mg creat — ABNORMAL HIGH (ref <30)

## 2017-05-27 LAB — PSA: PSA: 0.3 ng/mL (ref <=4.0)

## 2017-05-27 LAB — LIPID PANEL
Cholesterol: 111 mg/dL (ref <200)
HDL: 47 mg/dL (ref >40)
LDL Cholesterol: 47 mg/dL (ref <100)
Total CHOL/HDL Ratio: 2.4 Ratio (ref <5.0)
Triglycerides: 85 mg/dL (ref <150)
VLDL: 17 mg/dL (ref <30)

## 2017-05-27 LAB — HEMOGLOBIN A1C
Hgb A1c MFr Bld: 6.9 % — ABNORMAL HIGH (ref <5.7)
MEAN PLASMA GLUCOSE: 151 mg/dL

## 2017-05-30 ENCOUNTER — Encounter: Payer: Medicare Other | Admitting: Family Medicine

## 2017-05-31 ENCOUNTER — Other Ambulatory Visit: Payer: Self-pay | Admitting: *Deleted

## 2017-05-31 MED ORDER — GLIMEPIRIDE 1 MG PO TABS: ORAL_TABLET | ORAL | 3 refills | Status: DC

## 2017-06-09 DIAGNOSIS — C44311 Basal cell carcinoma of skin of nose: Secondary | ICD-10-CM | POA: Diagnosis not present

## 2017-08-01 ENCOUNTER — Ambulatory Visit (INDEPENDENT_AMBULATORY_CARE_PROVIDER_SITE_OTHER): Payer: Medicare Other | Admitting: Psychiatry

## 2017-08-01 ENCOUNTER — Encounter (HOSPITAL_COMMUNITY): Payer: Self-pay | Admitting: Psychiatry

## 2017-08-01 VITALS — BP 183/88 | HR 70 | Ht 70.0 in | Wt 182.2 lb

## 2017-08-01 DIAGNOSIS — Z818 Family history of other mental and behavioral disorders: Secondary | ICD-10-CM | POA: Diagnosis not present

## 2017-08-01 DIAGNOSIS — F331 Major depressive disorder, recurrent, moderate: Secondary | ICD-10-CM | POA: Diagnosis not present

## 2017-08-01 DIAGNOSIS — I2510 Atherosclerotic heart disease of native coronary artery without angina pectoris: Secondary | ICD-10-CM

## 2017-08-01 DIAGNOSIS — I2583 Coronary atherosclerosis due to lipid rich plaque: Secondary | ICD-10-CM

## 2017-08-01 DIAGNOSIS — Z810 Family history of intellectual disabilities: Secondary | ICD-10-CM | POA: Diagnosis not present

## 2017-08-01 MED ORDER — LORAZEPAM 1 MG PO TABS: 1.0000 mg | ORAL_TABLET | Freq: Two times a day (BID) | ORAL | 3 refills | Status: DC

## 2017-08-01 MED ORDER — FLUOXETINE HCL 20 MG PO CAPS: 20.0000 mg | ORAL_CAPSULE | Freq: Every morning | ORAL | 3 refills | Status: DC

## 2017-08-01 NOTE — Progress Notes (Signed)
Patient ID: Barry [name], male   DOB: September 12, 1933, 83 y.o.   MRN: [id number] Patient ID: Barry [name], male   DOB: 01-04-1933, 83 y.o.   MRN: [id number] Patient ID: Barry [name], male   DOB: 08/29/1933, 83 y.o.   MRN: [id number] Patient ID: Barry [name], male   DOB: 01-Feb-1933, 83 y.o.   MRN: [id number] Patient ID: Barry [name], male   DOB: [birth year], 83 y.o.   MRN: [id number] Patient ID: Barry [name], male   DOB: Dec 23, 1932, 83 y.o.   MRN: [id number] Patient ID: Barry [name], male   DOB: June 26, 1933, 83 y.o.   MRN: [id number] Patient ID: Barry [name], male   DOB: 26-Jun-1933, 83 y.o.   MRN: [id number] Patient ID: Barry [name], male   DOB: 01/24/1933, 83 y.o.   MRN: [id number] Patient ID: Barry [name], male   DOB: [birth year], 83 y.o.   MRN: [id number] Patient ID: Barry [name], male   DOB: November 09, 1932, 83 y.o.   MRN: [id number] [facility] [id number] Progress Note Barry [name] MRN: [id number] DOB: 06/03/1933 Age: 83 y.o.  Date: 08/01/2017 Start Time: 10:15 AM End Time: 10:30 AM  Chief Complaint: Chief Complaint  Patient presents with  . Anxiety  . Depression  . Follow-up   Subjective: I'm doing well".  This patient is an 83  year-old widowed white male who lives alone in [place]. He has 2 children and 3 grandchildren who live in [place]. 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  The patient states that he developed anxiety and depression in his 38s. His depression worsened when his wife died suddenly 75 years ago. However he is doing very well on his current medications. He denies being depressed or anxious and he is sleeping quite well. He spends most of his time with her grandchildren and really enjoys it. His social life is quite active. He has no side effects or complaints regarding his medications  The patient returns after 4 months He  States that he has been trying to get off the  nortriptyline. He thinks it's causing nightmares. He  sleeps better on the nights that he doesn't take it. As the dosages so low I explained that he can just go ahead and stop it for now. The Prozac is helped his mood and his anxiety is well controlled on the Ativan. He still staying very busy mowing yards.  Current psychiatric medication Ativan 0.5 mg 4 times a day Nortriptyline 10 mg daily Prozac 20 mg daily   Vitals: BP (!) 183/88   Pulse 70   Ht '5\' 10"'  (1.778 m)   Wt 182 lb 3.2 oz (82.6 kg)   BMI 26.14 kg/m   Allergies: Allergies  Allergen Reactions  . Lisinopril-Hydrochlorothiazide Swelling    ZESTRIL  . Doxycycline Rash   Medical History: Past Medical History:  Diagnosis Date  . AAA (abdominal aortic aneurysm) (HCC) 3-09   4.7 cm  . Allergy    rhinitis  . Anxiety   . Cancer ([place])    skin  . Cataract   . Depression   . Diabetes mellitus   . Diverticulosis   . GERD (gastroesophageal reflux disease)   . Hepatitis   . Hx of colonic polyps 08/26/2015  . Hyperlipidemia   . Hypertension   . Nephrolithiasis   . Personal history of colonic polyps    adenomas, serrated also  . PVD (peripheral vascular disease) ([place])   .  Tobacco abuse   Patient has history of diabetes mellitus, hypertension, peripheral vascular disease, abdominal aortic aneurysm, diverticulosis, GERD and hyperlipidemia.  He sees Dr. Timoteo Gaul regularly and will be going back soon for blood work.   Surgical History: Past Surgical History:  Procedure Laterality Date  . COLONOSCOPY  2006, 2008, 2010, 05/27/2011   numerous adenomas - 13 in 2006, 3, 2008, 2 2012 (up to 1 cm), 4 diminutive adenomas, serrated adenomas 2012. Diverticulosis and hemorrhoids also.  [facility] EYE SURGERY    . EYE SURGERY Left   . Kidney stone operation    . lower limb amputation, other toe 5th    . percutaneous stent graft repair of infrarenal AAA  11/10   5.6 cm (T. early)  . surgical excision basal cell carcinoma    . tympanic eardrum  repair    . VASECTOMY     Family History: family history includes Cancer in his brother and brother; Depression in his brother and sister; Diabetes in his sister; Emphysema in his father; Heart disease in his mother; Kidney disease in his sister; Learning disabilities in his sister; Lung disease in his sister; Mental retardation in his sister. Reviewed and the history of his sisters was clarified and the passing of [name] recorded.  Mental status examination Patient is well groomed well dressed.  He is pleasant.  He maintained good eye contact. His speech is soft clear and coherent. His thought process is logical linear and goal-directed. He denies any active or passive suicidal thoughts or homicidal thoughts.  He denies any auditory or visual hallucination.  He described his mood as good and his affect is mood congruent. There no psychotic symptoms present. He's alert and oriented x3. His insight judgment and impulse control is okay. His memory functions speech and fund of knowledge are all good  Lab Results:  Results for orders placed or performed in visit on 05/26/17 (from the past 8736 hour(s))  CBC with Differential/Platelet   Collection Time: 05/26/17  8:01 AM  Result Value Ref Range   WBC 6.3 3.8 - 10.8 K/uL   RBC 4.82 4.20 - 5.80 MIL/uL   Hemoglobin 15.3 13.0 - 17.0 g/dL   HCT 47.0 38.5 - 50.0 %   MCV 97.5 80.0 - 100.0 fL   MCH 31.7 27.0 - 33.0 pg   MCHC 32.6 32.0 - 36.0 g/dL   RDW 14.7 11.0 - 15.0 %   Platelets 214 140 - 400 K/uL   MPV 10.0 7.5 - 12.5 fL   Neutro Abs 3,087 1,500 - 7,800 cells/uL   Lymphs Abs 2,079 850 - 3,900 cells/uL   Monocytes Absolute 882 200 - 950 cells/uL   Eosinophils Absolute 189 15 - 500 cells/uL   Basophils Absolute 63 0 - 200 cells/uL   Neutrophils Relative % 49 %   Lymphocytes Relative 33 %   Monocytes Relative 14 %   Eosinophils Relative 3 %   Basophils Relative 1 %   Smear Review Criteria for review not met   COMPLETE METABOLIC PANEL WITH GFR    Collection Time: 05/26/17  8:01 AM  Result Value Ref Range   Sodium 140 135 - 146 mmol/L   Potassium 4.4 3.5 - 5.3 mmol/L   Chloride 104 98 - 110 mmol/L   CO2 26 20 - 32 mmol/L   Glucose, Bld 142 (H) 70 - 99 mg/dL   BUN 29 (H) 7 - 25 mg/dL   Creat 1.17 (H) 0.70 - 1.11 mg/dL   Total Bilirubin 0.3 0.2 -  1.2 mg/dL   Alkaline Phosphatase 40 40 - 115 U/L   AST 16 10 - 35 U/L   ALT 12 9 - 46 U/L   Total Protein 5.9 (L) 6.1 - 8.1 g/dL   Albumin 3.6 3.6 - 5.1 g/dL   Calcium 8.8 8.6 - 10.3 mg/dL   GFR, Est African American 67 >=60 mL/min   GFR, Est Non African American 58 (L) >=60 mL/min  Lipid panel   Collection Time: 05/26/17  8:01 AM  Result Value Ref Range   Cholesterol 111 <200 mg/dL   Triglycerides 85 <150 mg/dL   HDL 47 >40 mg/dL   Total CHOL/HDL Ratio 2.4 <5.0 Ratio   VLDL 17 <30 mg/dL   LDL Cholesterol 47 <100 mg/dL  Hemoglobin A1c   Collection Time: 05/26/17  8:01 AM  Result Value Ref Range   Hgb A1c MFr Bld 6.9 (H) <5.7 %   Mean Plasma Glucose 151 mg/dL  Microalbumin/Creatinine Ratio, Urine   Collection Time: 05/26/17  8:01 AM  Result Value Ref Range   Creatinine, Urine 206 20 - 370 mg/dL   Microalb, Ur 23.6 Not estab mg/dL   Microalb Creat Ratio 115 (H) <30 mcg/mg creat  PSA   Collection Time: 05/26/17  8:01 AM  Result Value Ref Range   PSA 0.3 <=4.0 ng/mL  Results for orders placed or performed in visit on 05/05/17 (from the past 8736 hour(s))  HM DIABETES EYE EXAM   Collection Time: 04/14/17 12:00 AM  Result Value Ref Range   HM Diabetic Eye Exam No Retinopathy No Retinopathy  Results for orders placed or performed in visit on 02/17/17 (from the past 8736 hour(s))  Lipid panel   Collection Time: 04/18/17  8:13 AM  Result Value Ref Range   Cholesterol 110 <200 mg/dL   Triglycerides 94 <150 mg/dL   HDL 47 >40 mg/dL   Total CHOL/HDL Ratio 2.3 <5.0 Ratio   VLDL 19 <30 mg/dL   LDL Cholesterol 44 <100 mg/dL  Hepatic function panel   Collection Time:  04/18/17  8:13 AM  Result Value Ref Range   Total Bilirubin 0.4 0.2 - 1.2 mg/dL   Bilirubin, Direct 0.1 <=0.2 mg/dL   Indirect Bilirubin 0.3 0.2 - 1.2 mg/dL   Alkaline Phosphatase 44 40 - 115 U/L   AST 18 10 - 35 U/L   ALT 13 9 - 46 U/L   Total Protein 6.3 6.1 - 8.1 g/dL   Albumin 3.7 3.6 - 5.1 g/dL  Results for orders placed or performed in visit on 01/27/17 (from the past 8736 hour(s))  CBC with Differential/Platelet   Collection Time: 01/27/17  8:34 AM  Result Value Ref Range   WBC 6.6 3.8 - 10.8 K/uL   RBC 5.08 4.20 - 5.80 MIL/uL   Hemoglobin 16.0 13.0 - 17.0 g/dL   HCT 47.8 38.5 - 50.0 %   MCV 94.1 80.0 - 100.0 fL   MCH 31.5 27.0 - 33.0 pg   MCHC 33.5 32.0 - 36.0 g/dL   RDW 14.2 11.0 - 15.0 %   Platelets 228 140 - 400 K/uL   MPV 10.1 7.5 - 12.5 fL   Neutro Abs 3,300 1,500 - 7,800 cells/uL   Lymphs Abs 2,310 850 - 3,900 cells/uL   Monocytes Absolute 792 200 - 950 cells/uL   Eosinophils Absolute 132 15 - 500 cells/uL   Basophils Absolute 66 0 - 200 cells/uL   Neutrophils Relative % 50 %   Lymphocytes Relative 35 %   Monocytes Relative  12 %   Eosinophils Relative 2 %   Basophils Relative 1 %   Smear Review Criteria for review not met   COMPLETE METABOLIC PANEL WITH GFR   Collection Time: 01/27/17  8:34 AM  Result Value Ref Range   Sodium 138 135 - 146 mmol/L   Potassium 4.4 3.5 - 5.3 mmol/L   Chloride 103 98 - 110 mmol/L   CO2 28 20 - 31 mmol/L   Glucose, Bld 133 (H) 70 - 99 mg/dL   BUN 17 7 - 25 mg/dL   Creat 1.02 0.70 - 1.11 mg/dL   Total Bilirubin 0.4 0.2 - 1.2 mg/dL   Alkaline Phosphatase 46 40 - 115 U/L   AST 17 10 - 35 U/L   ALT 11 9 - 46 U/L   Total Protein 6.1 6.1 - 8.1 g/dL   Albumin 3.4 (L) 3.6 - 5.1 g/dL   Calcium 9.1 8.6 - 10.3 mg/dL   GFR, Est African American 79 >=60 mL/min   GFR, Est Non African American 69 >=60 mL/min  Lipid panel   Collection Time: 01/27/17  8:34 AM  Result Value Ref Range   Cholesterol 162 <200 mg/dL   Triglycerides 122  <150 mg/dL   HDL 45 >40 mg/dL   Total CHOL/HDL Ratio 3.6 <5.0 Ratio   VLDL 24 <30 mg/dL   LDL Cholesterol 93 <100 mg/dL  Hemoglobin A1c   Collection Time: 01/27/17  8:34 AM  Result Value Ref Range   Hgb A1c MFr Bld 6.6 (H) <5.7 %   Mean Plasma Glucose 143 mg/dL  Microalbumin/Creatinine Ratio, Urine   Collection Time: 01/27/17  8:34 AM  Result Value Ref Range   Creatinine, Urine 128 20 - 370 mg/dL   Microalb, Ur 24.9 Not estab mg/dL   Microalb Creat Ratio 195 (H) <30 mcg/mg creat  Results for orders placed or performed in visit on 09/24/16 (from the past 8736 hour(s))  CBC with Differential/Platelet   Collection Time: 09/24/16  8:20 AM  Result Value Ref Range   WBC 5.9 3.8 - 10.8 K/uL   RBC 5.02 4.20 - 5.80 MIL/uL   Hemoglobin 16.1 13.0 - 17.0 g/dL   HCT 49.1 38.5 - 50.0 %   MCV 97.8 80.0 - 100.0 fL   MCH 32.1 27.0 - 33.0 pg   MCHC 32.8 32.0 - 36.0 g/dL   RDW 14.0 11.0 - 15.0 %   Platelets 240 140 - 400 K/uL   MPV 9.7 7.5 - 12.5 fL   Neutro Abs 3,186 1,500 - 7,800 cells/uL   Lymphs Abs 1,593 850 - 3,900 cells/uL   Monocytes Absolute 885 200 - 950 cells/uL   Eosinophils Absolute 177 15 - 500 cells/uL   Basophils Absolute 59 0 - 200 cells/uL   Neutrophils Relative % 54 %   Lymphocytes Relative 27 %   Monocytes Relative 15 %   Eosinophils Relative 3 %   Basophils Relative 1 %   Smear Review Criteria for review not met   COMPLETE METABOLIC PANEL WITH GFR   Collection Time: 09/24/16  8:20 AM  Result Value Ref Range   Sodium 137 135 - 146 mmol/L   Potassium 4.4 3.5 - 5.3 mmol/L   Chloride 102 98 - 110 mmol/L   CO2 31 20 - 31 mmol/L   Glucose, Bld 123 (H) 70 - 99 mg/dL   BUN 22 7 - 25 mg/dL   Creat 0.91 0.70 - 1.11 mg/dL   Total Bilirubin 0.3 0.2 - 1.2 mg/dL  Alkaline Phosphatase 39 (L) 40 - 115 U/L   AST 19 10 - 35 U/L   ALT 14 9 - 46 U/L   Total Protein 6.3 6.1 - 8.1 g/dL   Albumin 3.7 3.6 - 5.1 g/dL   Calcium 9.0 8.6 - 10.3 mg/dL   GFR, Est African American >89  >=60 mL/min   GFR, Est Non African American 79 >=60 mL/min  Lipid panel   Collection Time: 09/24/16  8:20 AM  Result Value Ref Range   Cholesterol 176 <200 mg/dL   Triglycerides 99 <150 mg/dL   HDL 43 >40 mg/dL   Total CHOL/HDL Ratio 4.1 <5.0 Ratio   VLDL 20 <30 mg/dL   LDL Cholesterol 113 (H) <100 mg/dL  Hemoglobin A1c   Collection Time: 09/24/16  8:20 AM  Result Value Ref Range   Hgb A1c MFr Bld 6.4 (H) <5.7 %   Mean Plasma Glucose 137 mg/dL  PSA   Collection Time: 09/24/16  8:20 AM  Result Value Ref Range   PSA 0.4 <=4.0 ng/mL  Microalbumin/Creatinine Ratio, Urine   Collection Time: 09/24/16  9:01 AM  Result Value Ref Range   Creatinine, Urine 145 20 - 370 mg/dL   Microalb, Ur 15.0 Not estab mg/dL   Microalb Creat Ratio 103 (H) <30 mcg/mg creat  PCP draws routine labs and nothing is emerging as of concern.  Assessment Axis I Depressive disorder NOS Axis II deferred Axis III see medical history Axis IV mild to moderate Axis V 65-70  Plan/Discussion: I took his vitals.  I reviewed CC, tobacco/med/surg Hx, meds effects/ side effects, problem list, therapies and responses as well as current situation/symptoms discussed options. Continue prozac  Fordepression and lorazepam for anxiety. He can discontinue the nortriptyline He'll return in 4 months See orders and pt instructions for more details.  MEDICATIONS this encounter: Meds ordered this encounter  Medications  . FLUoxetine (PROZAC) 20 MG capsule    Sig: Take 1 capsule (20 mg total) by mouth every morning.    Dispense:  90 capsule    Refill:  3  . LORazepam (ATIVAN) 1 MG tablet    Sig: Take 1 tablet (1 mg total) by mouth 2 (two) times daily.    Dispense:  60 tablet    Refill:  3    Medical Decision Making Problem Points:  Established problem, stable/improving (1), Review of last therapy session (1) and Review of psycho-social stressors (1) Data Points:  Review or order clinical lab tests (1) Review of  medication regiment & side effects (2)  I certify that outpatient services furnished can reasonably be expected to improve the patient's condition.   [name], MD

## 2017-08-12 ENCOUNTER — Other Ambulatory Visit: Payer: Self-pay | Admitting: *Deleted

## 2017-08-12 ENCOUNTER — Encounter: Payer: Self-pay | Admitting: *Deleted

## 2017-08-12 MED ORDER — HYDROCHLOROTHIAZIDE 25 MG PO TABS: 25.0000 mg | ORAL_TABLET | Freq: Every day | ORAL | 0 refills | Status: DC

## 2017-08-23 DIAGNOSIS — J069 Acute upper respiratory infection, unspecified: Secondary | ICD-10-CM | POA: Diagnosis not present

## 2017-08-23 DIAGNOSIS — J329 Chronic sinusitis, unspecified: Secondary | ICD-10-CM | POA: Diagnosis not present

## 2017-08-24 ENCOUNTER — Encounter: Payer: Medicare Other | Admitting: Family Medicine

## 2017-09-02 DIAGNOSIS — Z23 Encounter for immunization: Secondary | ICD-10-CM | POA: Diagnosis not present

## 2017-09-15 ENCOUNTER — Other Ambulatory Visit: Payer: Self-pay | Admitting: *Deleted

## 2017-09-15 MED ORDER — SIMVASTATIN 40 MG PO TABS: 40.0000 mg | ORAL_TABLET | Freq: Every day | ORAL | 3 refills | Status: DC

## 2017-09-22 ENCOUNTER — Other Ambulatory Visit: Payer: Self-pay | Admitting: *Deleted

## 2017-09-22 MED ORDER — RAMIPRIL 10 MG PO CAPS: ORAL_CAPSULE | ORAL | 1 refills | Status: DC

## 2017-09-23 ENCOUNTER — Other Ambulatory Visit (HOSPITAL_COMMUNITY): Payer: Self-pay | Admitting: Psychiatry

## 2017-09-29 ENCOUNTER — Other Ambulatory Visit: Payer: Self-pay | Admitting: *Deleted

## 2017-09-29 MED ORDER — METOPROLOL SUCCINATE ER 25 MG PO TB24: 12.5000 mg | ORAL_TABLET | Freq: Every day | ORAL | 3 refills | Status: DC

## 2017-09-29 MED ORDER — NIFEDIPINE ER OSMOTIC RELEASE 30 MG PO TB24: ORAL_TABLET | ORAL | 1 refills | Status: DC

## 2017-11-16 ENCOUNTER — Other Ambulatory Visit: Payer: Medicare Other

## 2017-11-17 ENCOUNTER — Other Ambulatory Visit: Payer: Medicare Other

## 2017-11-17 ENCOUNTER — Other Ambulatory Visit: Payer: Self-pay | Admitting: *Deleted

## 2017-11-17 DIAGNOSIS — E119 Type 2 diabetes mellitus without complications: Secondary | ICD-10-CM | POA: Diagnosis not present

## 2017-11-17 DIAGNOSIS — Z0000 Encounter for general adult medical examination without abnormal findings: Secondary | ICD-10-CM

## 2017-11-17 DIAGNOSIS — E785 Hyperlipidemia, unspecified: Secondary | ICD-10-CM | POA: Diagnosis not present

## 2017-11-17 DIAGNOSIS — I10 Essential (primary) hypertension: Secondary | ICD-10-CM | POA: Diagnosis not present

## 2017-11-17 DIAGNOSIS — Z125 Encounter for screening for malignant neoplasm of prostate: Secondary | ICD-10-CM | POA: Diagnosis not present

## 2017-11-17 MED ORDER — HYDROCHLOROTHIAZIDE 25 MG PO TABS: 25.0000 mg | ORAL_TABLET | Freq: Every day | ORAL | 0 refills | Status: DC

## 2017-11-18 ENCOUNTER — Encounter: Payer: Self-pay | Admitting: Family Medicine

## 2017-11-18 ENCOUNTER — Other Ambulatory Visit: Payer: Self-pay

## 2017-11-18 ENCOUNTER — Other Ambulatory Visit: Payer: Self-pay | Admitting: *Deleted

## 2017-11-18 ENCOUNTER — Ambulatory Visit (INDEPENDENT_AMBULATORY_CARE_PROVIDER_SITE_OTHER): Payer: Medicare Other | Admitting: Family Medicine

## 2017-11-18 VITALS — BP 138/80 | HR 66 | Temp 97.8°F | Resp 16 | Ht 70.0 in | Wt 179.0 lb

## 2017-11-18 DIAGNOSIS — E119 Type 2 diabetes mellitus without complications: Secondary | ICD-10-CM | POA: Diagnosis not present

## 2017-11-18 DIAGNOSIS — E7800 Pure hypercholesterolemia, unspecified: Secondary | ICD-10-CM | POA: Diagnosis not present

## 2017-11-18 DIAGNOSIS — I2583 Coronary atherosclerosis due to lipid rich plaque: Secondary | ICD-10-CM

## 2017-11-18 DIAGNOSIS — J0100 Acute maxillary sinusitis, unspecified: Secondary | ICD-10-CM | POA: Diagnosis not present

## 2017-11-18 DIAGNOSIS — I6521 Occlusion and stenosis of right carotid artery: Secondary | ICD-10-CM | POA: Diagnosis not present

## 2017-11-18 DIAGNOSIS — I2510 Atherosclerotic heart disease of native coronary artery without angina pectoris: Secondary | ICD-10-CM

## 2017-11-18 DIAGNOSIS — I10 Essential (primary) hypertension: Secondary | ICD-10-CM | POA: Diagnosis not present

## 2017-11-18 DIAGNOSIS — I779 Disorder of arteries and arterioles, unspecified: Secondary | ICD-10-CM | POA: Insufficient documentation

## 2017-11-18 DIAGNOSIS — Z0000 Encounter for general adult medical examination without abnormal findings: Secondary | ICD-10-CM

## 2017-11-18 DIAGNOSIS — I739 Peripheral vascular disease, unspecified: Secondary | ICD-10-CM

## 2017-11-18 LAB — HEMOGLOBIN A1C
HEMOGLOBIN A1C: 6.9 %{Hb} — AB (ref <5.7)
Mean Plasma Glucose: 151 (calc)
eAG (mmol/L): 8.4 (calc)

## 2017-11-18 LAB — CBC WITH DIFFERENTIAL/PLATELET
BASOS ABS: 58 {cells}/uL (ref 0–200)
Basophils Relative: 0.9 %
EOS PCT: 3.4 %
Eosinophils Absolute: 218 cells/uL (ref 15–500)
HCT: 48.5 % (ref 38.5–50.0)
Hemoglobin: 16.3 g/dL (ref 13.2–17.1)
Lymphs Abs: 2118 cells/uL (ref 850–3900)
MCH: 31.3 pg (ref 27.0–33.0)
MCHC: 33.6 g/dL (ref 32.0–36.0)
MCV: 93.3 fL (ref 80.0–100.0)
MONOS PCT: 13.1 %
MPV: 10.3 fL (ref 7.5–12.5)
NEUTROS PCT: 49.5 %
Neutro Abs: 3168 cells/uL (ref 1500–7800)
Platelets: 252 10*3/uL (ref 140–400)
RBC: 5.2 10*6/uL (ref 4.20–5.80)
RDW: 13 % (ref 11.0–15.0)
Total Lymphocyte: 33.1 %
WBC mixed population: 838 cells/uL (ref 200–950)
WBC: 6.4 10*3/uL (ref 3.8–10.8)

## 2017-11-18 LAB — COMPLETE METABOLIC PANEL WITH GFR
AG RATIO: 1.5 (calc) (ref 1.0–2.5)
ALT: 11 U/L (ref 9–46)
AST: 15 U/L (ref 10–35)
Albumin: 3.7 g/dL (ref 3.6–5.1)
Alkaline phosphatase (APISO): 41 U/L (ref 40–115)
BUN: 23 mg/dL (ref 7–25)
CALCIUM: 9.1 mg/dL (ref 8.6–10.3)
CO2: 29 mmol/L (ref 20–32)
Chloride: 103 mmol/L (ref 98–110)
Creat: 1.07 mg/dL (ref 0.70–1.11)
GFR, EST AFRICAN AMERICAN: 75 mL/min/{1.73_m2} (ref >=60)
GFR, Est Non African American: 64 mL/min/{1.73_m2} (ref >=60)
GLOBULIN: 2.4 g/dL (ref 1.9–3.7)
Glucose, Bld: 150 mg/dL — ABNORMAL HIGH (ref 65–99)
POTASSIUM: 4.6 mmol/L (ref 3.5–5.3)
SODIUM: 139 mmol/L (ref 135–146)
TOTAL PROTEIN: 6.1 g/dL (ref 6.1–8.1)
Total Bilirubin: 0.4 mg/dL (ref 0.2–1.2)

## 2017-11-18 LAB — LIPID PANEL
CHOLESTEROL: 129 mg/dL (ref <200)
HDL: 54 mg/dL (ref >40)
LDL Cholesterol (Calc): 57 mg/dL (calc)
Non-HDL Cholesterol (Calc): 75 mg/dL (calc) (ref <130)
Total CHOL/HDL Ratio: 2.4 (calc) (ref <5.0)
Triglycerides: 96 mg/dL (ref <150)

## 2017-11-18 LAB — MICROALBUMIN / CREATININE URINE RATIO
CREATININE, URINE: 95 mg/dL (ref 20–320)
Microalb Creat Ratio: 269 mcg/mg creat — ABNORMAL HIGH (ref <30)
Microalb, Ur: 25.6 mg/dL

## 2017-11-18 LAB — PSA: PSA: 0.4 ng/mL (ref <=4.0)

## 2017-11-18 MED ORDER — AZITHROMYCIN 250 MG PO TABS: ORAL_TABLET | ORAL | 0 refills | Status: DC

## 2017-11-18 NOTE — Patient Instructions (Signed)
F/U 6 months

## 2017-11-18 NOTE — Progress Notes (Signed)
Subjective:   Patient presents for Medicare Annual/Subsequent preventive examination.   PT here for CPE Recent fasting labs for Diabetes - A1C  6.9%, LDL at goal, taking Amaryl,  CBG fasting range 120's, highest 145  HTN- well controlled  Sinus pressure and drainage, ear pressure for past 2 weeks.Very little cough, used some robitussin and nasal saline. Had some mild nausea   Continues to smoke, not ready to quit     Review Past Medical/Family/Social: per EMR   Risk Factors  Current exercise habits: walks Dietary issues discussed:yes   Cardiac risk factors: . DM, CAD  Depression Screen  (Note: if answer to either of the following is "Yes", a more complete depression screening is indicated)  Over the past two weeks, have you felt down, depressed or hopeless? No Over the past two weeks, have you felt little interest or pleasure in doing things? No Have you lost interest or pleasure in daily life? No Do you often feel hopeless? No Do you cry easily over simple problems? No   Activities of Daily Living  In your present state of health, do you have any difficulty performing the following activities?:  Driving? No  Managing money? No  Feeding yourself? No  Getting from bed to chair? No  Climbing a flight of stairs? No  Preparing food and eating?: No  Bathing or showering? No  Getting dressed: No  Getting to the toilet? No  Using the toilet:No  Moving around from place to place: No  In the past year have you fallen or had a near fall?:No  Are you sexually active? yes  Do you have more than one partner? No   Hearing Difficulties: yes Do you often ask people to speak up or repeat themselves? yes Do you experience ringing or noises in your ears? yes Do you have difficulty understanding soft or whispered voices? yes Do you feel that you have a problem with memory? No Do you often misplace items? No  Do you feel safe at home? Yes  Cognitive Testing  Alert? Yes Normal  Appearance?Yes  Oriented to person? Yes Place? Yes  Time? Yes  Recall of three objects? Yes  Can perform simple calculations? Yes  Displays appropriate judgment?Yes  Can read the correct time from a watch face?Yes     List the Names of Other Physician/Practitioners you currently use: Cardiology  GI, Eye doctor, psychiatry   Screening Tests / Date Colonoscopy - Due                    Zostavax - Refused  Influenza Vaccine  Tetanus/tdap- UTD  ROS: GEN- denies fatigue, fever, weight loss,weakness, recent illness HEENT- denies eye drainage, change in vision, nasal discharge, CVS- denies chest pain, palpitations RESP- denies SOB, cough, wheeze ABD- denies N/V, change in stools, abd pain GU- denies dysuria, hematuria, dribbling, incontinence MSK- denies joint pain, muscle aches, injury Neuro- denies headache, dizziness, syncope, seizure activity  Physical: GEN- NAD, alert and oriented x3 HEENT- PERRL, EOMI, non injected sclera, pink conjunctiva, MMM, oropharynx clear Neck- Supple, no thryomegaly, Right carotid bruit  CVS- RRR, no murmur RESP-CTAB EXT- No edema, missing 5th digit right foot Pulses- Radial, DP- 2+    Assessment:    Annual wellness medicare exam   Plan:    During the course of the visit the patient was educated and counseled about appropriate screening and preventive services including:   Advised to wear his hearing AIDE - had hearing exam recently told right  ear worse did not    Carotid Bruit- Right side, but history of mild plaque back in 2006  Discussed advanced directives states son is setting something up, declined paperwork  DM- controlled on amaryl, no hypoglycemia symptoms Hypertension blood pressure controlled lipids at goal Declines shingles vaccine  Depression-Followed by psychiatry   Acute sinusitis we will add an Z-Pak continue nasal saline antihistamine Patient Instructions (the written plan) was given to the patient.  Medicare  Attestation  I have personally reviewed:  The patient's medical and social history  Their use of alcohol, tobacco or illicit drugs  Their current medications and supplements  The patient's functional ability including ADLs,fall risks, home safety risks, cognitive, and hearing and visual impairment  Diet and physical activities  Evidence for depression or mood disorders  The patient's weight, height, BMI, and visual acuity have been recorded in the chart. I have made referrals, counseling, and provided education to the patient based on review of the above and I have provided the patient with a written personalized care plan for preventive services.

## 2017-11-23 ENCOUNTER — Encounter (HOSPITAL_COMMUNITY): Payer: Self-pay

## 2017-11-25 ENCOUNTER — Ambulatory Visit (HOSPITAL_COMMUNITY)
Admission: RE | Admit: 2017-11-25 | Discharge: 2017-11-25 | Disposition: A | Discharge status: Home / Self Care | Payer: Medicare Other | Source: Ambulatory Visit | Attending: Surgery | Admitting: Surgery

## 2017-11-25 DIAGNOSIS — I6521 Occlusion and stenosis of right carotid artery: Secondary | ICD-10-CM | POA: Diagnosis not present

## 2017-11-25 DIAGNOSIS — I6523 Occlusion and stenosis of bilateral carotid arteries: Secondary | ICD-10-CM | POA: Diagnosis not present

## 2017-11-28 LAB — VAS US CAROTID
LCCADSYS: 105 cm/s
LCCAPDIAS: 10 cm/s
LCCAPSYS: 83 cm/s
LEFT ECA DIAS: -11 cm/s
LEFT VERTEBRAL DIAS: -6 cm/s
LICADSYS: -54 cm/s
LICAPDIAS: -14 cm/s
Left CCA dist dias: 19 cm/s
Left ICA dist dias: -12 cm/s
Left ICA prox sys: -88 cm/s
RCCADSYS: -66 cm/s
RCCAPDIAS: 13 cm/s
RCCAPSYS: 68 cm/s
RIGHT CCA MID DIAS: 20 cm/s
RIGHT ECA DIAS: -12 cm/s
RIGHT VERTEBRAL DIAS: 10 cm/s

## 2017-12-01 ENCOUNTER — Ambulatory Visit (HOSPITAL_COMMUNITY): Payer: Self-pay | Admitting: Psychiatry

## 2017-12-12 ENCOUNTER — Ambulatory Visit (HOSPITAL_COMMUNITY): Payer: Medicare Other | Admitting: Psychiatry

## 2017-12-15 DIAGNOSIS — L570 Actinic keratosis: Secondary | ICD-10-CM | POA: Diagnosis not present

## 2017-12-15 DIAGNOSIS — Z08 Encounter for follow-up examination after completed treatment for malignant neoplasm: Secondary | ICD-10-CM | POA: Diagnosis not present

## 2017-12-15 DIAGNOSIS — X32XXXD Exposure to sunlight, subsequent encounter: Secondary | ICD-10-CM | POA: Diagnosis not present

## 2017-12-15 DIAGNOSIS — Z85828 Personal history of other malignant neoplasm of skin: Secondary | ICD-10-CM | POA: Diagnosis not present

## 2017-12-15 DIAGNOSIS — C44319 Basal cell carcinoma of skin of other parts of face: Secondary | ICD-10-CM | POA: Diagnosis not present

## 2017-12-19 DIAGNOSIS — J069 Acute upper respiratory infection, unspecified: Secondary | ICD-10-CM | POA: Diagnosis not present

## 2017-12-22 ENCOUNTER — Encounter (HOSPITAL_COMMUNITY): Payer: Self-pay | Admitting: Psychiatry

## 2017-12-22 ENCOUNTER — Ambulatory Visit (INDEPENDENT_AMBULATORY_CARE_PROVIDER_SITE_OTHER): Payer: Medicare Other | Admitting: Psychiatry

## 2017-12-22 VITALS — BP 158/87 | HR 66 | Ht 70.0 in | Wt 174.0 lb

## 2017-12-22 DIAGNOSIS — Z818 Family history of other mental and behavioral disorders: Secondary | ICD-10-CM | POA: Diagnosis not present

## 2017-12-22 DIAGNOSIS — I6521 Occlusion and stenosis of right carotid artery: Secondary | ICD-10-CM | POA: Diagnosis not present

## 2017-12-22 DIAGNOSIS — Z810 Family history of intellectual disabilities: Secondary | ICD-10-CM | POA: Diagnosis not present

## 2017-12-22 DIAGNOSIS — F331 Major depressive disorder, recurrent, moderate: Secondary | ICD-10-CM | POA: Diagnosis not present

## 2017-12-22 DIAGNOSIS — F17210 Nicotine dependence, cigarettes, uncomplicated: Secondary | ICD-10-CM | POA: Diagnosis not present

## 2017-12-22 MED ORDER — LORAZEPAM 1 MG PO TABS: 1.0000 mg | ORAL_TABLET | Freq: Two times a day (BID) | ORAL | 5 refills | Status: DC

## 2017-12-22 MED ORDER — FLUOXETINE HCL 20 MG PO CAPS: 20.0000 mg | ORAL_CAPSULE | Freq: Every morning | ORAL | 3 refills | Status: DC

## 2017-12-22 NOTE — Progress Notes (Signed)
BH MD/PA/NP OP Progress Note  12/22/2017 3:11 PM Barry [name]  MRN:  [id number]  Chief Complaint:  Chief Complaint    Depression; Anxiety; Follow-up     HPI: This patient is an 84  year-old widowed white male who lives alone in [place]. He has 2 children and 3 grandchildren who live in [place]. 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  The patient states that he developed anxiety and depression in his 79s. His depression worsened when his wife died suddenly 76 years ago. However he is doing very well on his current medications. He denies being depressed or anxious and he is sleeping quite well. He spends most of his time with her grandchildren and really enjoys it. His social life is quite active. He has no side effects or complaints regarding his medications  The patient returns after 6 months.  He is actually doing well.  Last time he stopped the nortriptyline to the nightmares and he is sleeping just fine without it.  His mood is good and he denies any symptoms of depression.  His anxiety is well controlled on the Ativan.  He is staying busy doing things with his family and working on his house.  Visit Diagnosis:    ICD-10-CM   1. Major depressive disorder, recurrent episode, moderate (HCC) F33.1     Past Psychiatric History: Long-term outpatient treatment  Past Medical History:  Past Medical History:  Diagnosis Date  . AAA (abdominal aortic aneurysm) (HCC) 3-09   4.7 cm  . Allergy    rhinitis  . Anxiety   . Cancer ([place])    skin  . Cataract   . Depression   . Diabetes mellitus   . Diverticulosis   . GERD (gastroesophageal reflux disease)   . Hepatitis   . Hx of colonic polyps 08/26/2015  . Hyperlipidemia   . Hypertension   . Nephrolithiasis   . Personal history of colonic polyps    adenomas, serrated also  . PVD (peripheral vascular disease) ([place])   . Tobacco abuse     Past Surgical History:  Procedure Laterality Date  .  COLONOSCOPY  2006, 2008, 2010, 05/27/2011   numerous adenomas - 13 in 2006, 3, 2008, 2 2012 (up to 1 cm), 4 diminutive adenomas, serrated adenomas 2012. Diverticulosis and hemorrhoids also.  Marland Kitchen EYE SURGERY    . EYE SURGERY Left   . Kidney stone operation    . lower limb amputation, other toe 5th    . percutaneous stent graft repair of infrarenal AAA  11/10   5.6 cm (T. early)  . surgical excision basal cell carcinoma    . tympanic eardrum repair    . VASECTOMY      Family Psychiatric History: See below  Family History:  Family History  Problem Relation Age of Onset  . Emphysema Father   . Cancer Brother        Lung  . Depression Brother   . Depression Sister   . Lung disease Sister        Fibrosis and died from pneumonia on 2013-04-11  . Kidney disease Sister   . Diabetes Sister   . Learning disabilities Sister   . Mental retardation Sister   . Heart disease Mother   . Cancer Brother   . Colon cancer Neg Hx   . Dementia Neg Hx   . Alcohol abuse Neg Hx   . Drug abuse Neg Hx   . Paranoid  behavior Neg Hx   . Schizophrenia Neg Hx   . Anxiety disorder Neg Hx   . Bipolar disorder Neg Hx   . OCD Neg Hx   . Sexual abuse Neg Hx   . Physical abuse Neg Hx   . Seizures Neg Hx   . Esophageal cancer Neg Hx   . Stomach cancer Neg Hx   . Rectal cancer Neg Hx     Social History:  Social History   Socioeconomic History  . Marital status: Widowed    Spouse name: None  . Number of children: 2  . Years of education: None  . Highest education level: None  Social Needs  . Financial resource strain: None  . Food insecurity - worry: None  . Food insecurity - inability: None  . Transportation needs - medical: None  . Transportation needs - non-medical: None  Occupational History    Employer: RETIRED  Tobacco Use  . Smoking status: Current Every Day Smoker    Packs/day: 1.00    Years: 60.00    Pack years: 60.00    Types: Cigarettes  . Smokeless tobacco: Never Used  . Tobacco  comment: smokes about 16-20 cigs a day as of 04/04/2013  Substance and Sexual Activity  . Alcohol use: No  . Drug use: No  . Sexual activity: Yes    Partners: Female    Birth control/protection: Surgical    Comment: vasectomy  Other Topics Concern  . 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 Lives alone. 1- step-daughter, 1 son - '68; 3 grandchildren. does odd-jobs, yard work    Allergies:  Allergies  Allergen Reactions  . Lisinopril-Hydrochlorothiazide Swelling    ZESTRIL  . Doxycycline Rash    Metabolic Disorder Labs: Lab Results  Component Value Date   HGBA1C 6.9 (H) 11/17/2017   MPG 151 11/17/2017   MPG 151 05/26/2017   No results found for: PROLACTIN Lab Results  Component Value Date   CHOL 129 11/17/2017   TRIG 96 11/17/2017   HDL 54 11/17/2017   CHOLHDL 2.4 11/17/2017   VLDL 17 05/26/2017   LDLCALC 47 05/26/2017   LDLCALC 44 04/18/2017   Lab Results  Component Value Date   TSH 2.080 02/05/2015   TSH 1.59 06/06/2012    Therapeutic Level Labs: No results found for: LITHIUM No results found for: VALPROATE No components found for:  CBMZ  Current Medications: Current Outpatient Medications  Medication Sig Dispense Refill  . aspirin EC 81 MG tablet Take 81 mg by mouth every morning.    Marland Kitchen azithromycin (ZITHROMAX) 250 MG tablet Take 2 tablets x 1 day, then 1 tablet daily for 4 days 6 tablet 0  . ezetimibe (ZETIA) 10 MG tablet Take 1 tablet (10 mg total) by mouth daily. 90 tablet 3  . FLUoxetine (PROZAC) 20 MG capsule Take 1 capsule (20 mg total) by mouth every morning. 90 capsule 3  . glimepiride (AMARYL) 1 MG tablet TAKE 1 BY MOUTH TWICE DAILY 30 tablet 3  . hydrochlorothiazide (HYDRODIURIL) 25 MG tablet Take 1 tablet (25 mg total) by mouth daily. 90 tablet 0  . LORazepam (ATIVAN) 1 MG tablet Take 1 tablet (1 mg total) by mouth 2 (two) times daily. 60 tablet 5  . metoprolol succinate  (TOPROL-XL) 25 MG 24 hr tablet Take 0.5 tablets (12.5 mg total) by mouth daily. 45 tablet 3  . NIFEdipine (NIFEDICAL XL) 30 MG 24 hr tablet  TAKE 1 BY MOUTH DAILY 90 tablet 1  . ramipril (ALTACE) 10 MG capsule TAKE 1 BY MOUTH DAILY 90 capsule 1  . simvastatin (ZOCOR) 40 MG tablet Take 1 tablet (40 mg total) by mouth at bedtime. 90 tablet 3   No current facility-administered medications for this visit.      Musculoskeletal: Strength & Muscle Tone: within normal limits Gait & Station: normal Patient leans: N/A  Psychiatric Specialty Exam: Review of Systems  HENT: Positive for congestion and hearing loss.   All other systems reviewed and are negative.   Blood pressure (!) 158/87, pulse 66, height 5\' 10"  (1.778 m), weight 174 lb (78.9 kg).Body mass index is 24.97 kg/m.  General Appearance: Casual, Neat and Well Groomed  Eye Contact:  Good  Speech:  Clear and Coherent  Volume:  Normal  Mood:  Euthymic  Affect:  Congruent  Thought Process:  Goal Directed  Orientation:  Full (Time, Place, and Person)  Thought Content: WDL   Suicidal Thoughts:  No  Homicidal Thoughts:  No  Memory:  Immediate;   Good Recent;   Good Remote;   Good  Judgement:  Good  Insight:  Fair  Psychomotor Activity:  Normal  Concentration:  Concentration: Good and Attention Span: Good  Recall:  Good  Fund of Knowledge: Good  Language: Good  Akathisia:  No  Handed:  Right  AIMS (if indicated): not done  Assets:  Communication Skills Desire for Improvement Physical Health Resilience Social Support Talents/Skills  ADL's:  Intact  Cognition: WNL  Sleep:  Good   Screenings: PHQ2-9     Office Visit from 11/18/2017 in [place] Office Visit from 01/28/2017 in [place] Office Visit from 07/12/2016 in [place] Office Visit from 02/17/2015 in [place] Office Visit from 04/10/2014 in [place]  PHQ-2 Total Score  0  0  0   0  0  PHQ-9 Total Score  0  0  0  0  No data       Assessment and Plan: Patient is an 84 year old male with a history of depression and anxiety.  He is doing very well on his current regimen.  He will continue Prozac 20 mg daily for depression and Ativan 1 mg twice a day as needed for anxiety.  He will return to see me in 6 months or call sooner if needed   [name], MD 12/22/2017, 3:11 PM

## 2018-01-23 DIAGNOSIS — J209 Acute bronchitis, unspecified: Secondary | ICD-10-CM | POA: Diagnosis not present

## 2018-01-23 DIAGNOSIS — J069 Acute upper respiratory infection, unspecified: Secondary | ICD-10-CM | POA: Diagnosis not present

## 2018-02-17 ENCOUNTER — Other Ambulatory Visit (HOSPITAL_COMMUNITY): Payer: Self-pay | Admitting: Psychiatry

## 2018-02-20 ENCOUNTER — Other Ambulatory Visit: Payer: Self-pay | Admitting: Family Medicine

## 2018-02-20 ENCOUNTER — Other Ambulatory Visit (HOSPITAL_COMMUNITY): Payer: Self-pay | Admitting: Psychiatry

## 2018-02-20 ENCOUNTER — Telehealth (HOSPITAL_COMMUNITY): Payer: Self-pay

## 2018-02-20 NOTE — Telephone Encounter (Signed)
Medication management - Patient came in concerned he was having problems filling his Lorazepam. Called [facility] to verify they had order from 12/22/17 + 5 refills and are filling as prescribed.  Pt informed and will contact us back if needed

## 2018-02-21 ENCOUNTER — Other Ambulatory Visit: Payer: Self-pay | Admitting: Cardiology

## 2018-02-21 ENCOUNTER — Other Ambulatory Visit: Payer: Self-pay | Admitting: Family Medicine

## 2018-02-21 DIAGNOSIS — I2510 Atherosclerotic heart disease of native coronary artery without angina pectoris: Secondary | ICD-10-CM

## 2018-03-27 ENCOUNTER — Other Ambulatory Visit: Payer: Self-pay | Admitting: Family Medicine

## 2018-04-17 ENCOUNTER — Encounter

## 2018-04-21 ENCOUNTER — Other Ambulatory Visit: Payer: Self-pay | Admitting: Family Medicine

## 2018-04-26 ENCOUNTER — Other Ambulatory Visit: Payer: Self-pay | Admitting: Family Medicine

## 2018-05-08 ENCOUNTER — Other Ambulatory Visit: Payer: Self-pay | Admitting: Family Medicine

## 2018-05-17 NOTE — Progress Notes (Signed)
HPI: FU CAD. Patient underwent cardiac catheterization in 2005; he was found to have nonobstructive disease other than a 90% stenosis in a small branch of the right coronary artery. He was noted to have an 80% iliac stenosis and an abdominal aortic aneurysm. Procedure complicated by peripheral emboli requiring amputation of a digit. Patient has had stent graft of abdominal aortic aneurysm.  Carotid Dopplers February 2019 showed 1 to 39% bilateral stenosis.  Since last seen, the patient has dyspnea with more extreme activities but not with routine activities. It is relieved with rest. It is not associated with chest pain. There is no orthopnea, PND or pedal edema. There is no syncope or palpitations. There is no exertional chest pain.   Current Outpatient Medications  Medication Sig Dispense Refill  . aspirin EC 81 MG tablet Take 81 mg by mouth every morning.    . ezetimibe (ZETIA) 10 MG tablet TAKE 1 TABLET BY MOUTH DAILY 90 tablet 2  . FLUoxetine (PROZAC) 20 MG capsule Take 1 capsule (20 mg total) by mouth every morning. 90 capsule 3  . glimepiride (AMARYL) 1 MG tablet TAKE 1 TABLET BY MOUTH TWICE DAILY 180 tablet 3  . hydrochlorothiazide (HYDRODIURIL) 25 MG tablet TAKE 1 TABLET BY MOUTH DAILY 90 tablet 0  . LORazepam (ATIVAN) 1 MG tablet Take 1 tablet (1 mg total) by mouth 2 (two) times daily. 60 tablet 5  . metoprolol succinate (TOPROL-XL) 25 MG 24 hr tablet Take 0.5 tablets (12.5 mg total) by mouth daily. 45 tablet 3  . NIFEdipine (PROCARDIA-XL/ADALAT-CC/NIFEDICAL-XL) 30 MG 24 hr tablet TAKE 1 TABLET BY MOUTH DAILY 90 tablet 0  . ramipril (ALTACE) 10 MG capsule TAKE ONE CAPSULE BY MOUTH DAILY 90 capsule 0  . simvastatin (ZOCOR) 40 MG tablet Take 1 tablet (40 mg total) by mouth at bedtime. 90 tablet 3   No current facility-administered medications for this visit.      Past Medical History:  Diagnosis Date  . AAA (abdominal aortic aneurysm) (HCC) 3-09   4.7 cm  . Allergy    rhinitis  . Anxiety   . Cancer ([place])    skin  . Cataract   . Depression   . Diabetes mellitus   . Diverticulosis   . GERD (gastroesophageal reflux disease)   . Hepatitis   . Hx of colonic polyps 08/26/2015  . Hyperlipidemia   . Hypertension   . Nephrolithiasis   . Personal history of colonic polyps    adenomas, serrated also  . PVD (peripheral vascular disease) ([place])   . Tobacco abuse     Past Surgical History:  Procedure Laterality Date  . COLONOSCOPY  2006, 2008, 2010, 05/27/2011   numerous adenomas - 13 in 2006, 3, 2008, 2 2012 (up to 1 cm), 4 diminutive adenomas, serrated adenomas 2012. Diverticulosis and hemorrhoids also.  Marland Kitchen EYE SURGERY    . EYE SURGERY Left   . Kidney stone operation    . lower limb amputation, other toe 5th    . percutaneous stent graft repair of infrarenal AAA  11/10   5.6 cm (T. early)  . surgical excision basal cell carcinoma    . tympanic eardrum repair    . VASECTOMY      Social History   Socioeconomic History  . Marital status: Widowed    Spouse name: Not on file  . Number of children: 2  . Years of education: Not on file  . Highest education level: Not on file  Occupational History    Employer: RETIRED  Social Needs  . Financial resource strain: Not on file  . Food insecurity:    Worry: Not on file    Inability: Not on file  . Transportation needs:    Medical: Not on file    Non-medical: Not on file  Tobacco Use  . Smoking status: Current Every Day Smoker    Packs/day: 1.00    Years: 60.00    Pack years: 60.00    Types: Cigarettes  . Smokeless tobacco: Never Used  . Tobacco comment: smokes about 16-20 cigs a day as of 04/04/2013  Substance and Sexual Activity  . Alcohol use: No  . Drug use: No  . Sexual activity: Yes    Partners: Female    Birth control/protection: Surgical    Comment: vasectomy  Lifestyle  . Physical activity:    Days per week: Not on file    Minutes per session: Not on file  . Stress: Not on file    Relationships  . Social connections:    Talks on phone: Not on file    Gets together: Not on file    Attends religious service: Not on file    Active member of club or organization: Not on file    Attends meetings of clubs or organizations: Not on file    Relationship status: Not on file  . Intimate partner violence:    Fear of current or ex partner: Not on file    Emotionally abused: Not on file    Physically abused: Not on file    Forced sexual activity: Not on file  Other Topics Concern  . 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 Lives alone. 1- step-daughter, 1 son - '68; 3 grandchildren. does odd-jobs, yard work    Family History  Problem Relation Age of Onset  . Emphysema Father   . Cancer Brother        Lung  . Depression Brother   . Depression Sister   . Lung disease Sister        Fibrosis and died from pneumonia on 2013-04-19  . Kidney disease Sister   . Diabetes Sister   . Learning disabilities Sister   . Mental retardation Sister   . Heart disease Mother   . Cancer Brother   . Colon cancer Neg Hx   . Dementia Neg Hx   . Alcohol abuse Neg Hx   . Drug abuse Neg Hx   . Paranoid behavior Neg Hx   . Schizophrenia Neg Hx   . Anxiety disorder Neg Hx   . Bipolar disorder Neg Hx   . OCD Neg Hx   . Sexual abuse Neg Hx   . Physical abuse Neg Hx   . Seizures Neg Hx   . Esophageal cancer Neg Hx   . Stomach cancer Neg Hx   . Rectal cancer Neg Hx     ROS: no fevers or chills, productive cough, hemoptysis, dysphasia, odynophagia, melena, hematochezia, dysuria, hematuria, rash, seizure activity, orthopnea, PND, pedal edema, claudication. Remaining systems are negative.  Physical Exam: Well-developed well-nourished in no acute distress.  Skin is warm and dry.  HEENT is normal.  Neck is supple.  Chest is clear to auscultation with normal expansion.  Cardiovascular exam is regular rate and  rhythm.  Abdominal exam nontender or distended. No masses palpated. Extremities show no edema. neuro grossly intact  ECG-sinus  rhythm at a rate of 60.  Occasional PVC.  Nonspecific ST changes.  Personally reviewed  A/P  1 coronary artery disease-patient denies chest pain.  Continue aspirin and statin.  2 hypertension-blood pressure is controlled.  Continue present medications.  3 hyperlipidemia-plan to continue present medications.  He did not tolerate Lipitor or Crestor previously.  Check lipids and liver.  4 tobacco abuse-patient counseled on discontinuing.  5 prior abdominal aortic aneurysm repair-patient is followed by vascular surgery.  Kirk Ruths, MD

## 2018-05-23 ENCOUNTER — Encounter: Payer: Self-pay | Admitting: Cardiology

## 2018-05-23 ENCOUNTER — Ambulatory Visit (INDEPENDENT_AMBULATORY_CARE_PROVIDER_SITE_OTHER): Payer: Medicare Other | Admitting: Cardiology

## 2018-05-23 ENCOUNTER — Other Ambulatory Visit: Payer: Medicare Other

## 2018-05-23 VITALS — BP 140/76 | HR 60 | Ht 70.0 in | Wt 174.4 lb

## 2018-05-23 DIAGNOSIS — E119 Type 2 diabetes mellitus without complications: Secondary | ICD-10-CM | POA: Diagnosis not present

## 2018-05-23 DIAGNOSIS — E7800 Pure hypercholesterolemia, unspecified: Secondary | ICD-10-CM | POA: Diagnosis not present

## 2018-05-23 DIAGNOSIS — I10 Essential (primary) hypertension: Secondary | ICD-10-CM

## 2018-05-23 DIAGNOSIS — I2510 Atherosclerotic heart disease of native coronary artery without angina pectoris: Secondary | ICD-10-CM | POA: Diagnosis not present

## 2018-05-23 DIAGNOSIS — Z0000 Encounter for general adult medical examination without abnormal findings: Secondary | ICD-10-CM | POA: Diagnosis not present

## 2018-05-23 DIAGNOSIS — F17200 Nicotine dependence, unspecified, uncomplicated: Secondary | ICD-10-CM

## 2018-05-23 DIAGNOSIS — I6521 Occlusion and stenosis of right carotid artery: Secondary | ICD-10-CM

## 2018-05-23 NOTE — Patient Instructions (Signed)
Your physician wants you to follow-up in: ONE YEAR WITH DR CRENSHAW You will receive a reminder letter in the mail two months in advance. If you don't receive a letter, please call our office to schedule the follow-up appointment.   If you need a refill on your cardiac medications before your next appointment, please call your pharmacy.  

## 2018-05-24 ENCOUNTER — Other Ambulatory Visit: Payer: Self-pay

## 2018-05-24 ENCOUNTER — Encounter: Payer: Self-pay | Admitting: Family Medicine

## 2018-05-24 ENCOUNTER — Ambulatory Visit (INDEPENDENT_AMBULATORY_CARE_PROVIDER_SITE_OTHER): Payer: Medicare Other | Admitting: Family Medicine

## 2018-05-24 VITALS — BP 138/68 | HR 66 | Temp 98.1°F | Resp 14 | Ht 70.0 in | Wt 175.0 lb

## 2018-05-24 DIAGNOSIS — E119 Type 2 diabetes mellitus without complications: Secondary | ICD-10-CM

## 2018-05-24 DIAGNOSIS — F17200 Nicotine dependence, unspecified, uncomplicated: Secondary | ICD-10-CM | POA: Diagnosis not present

## 2018-05-24 DIAGNOSIS — I10 Essential (primary) hypertension: Secondary | ICD-10-CM | POA: Diagnosis not present

## 2018-05-24 DIAGNOSIS — E7800 Pure hypercholesterolemia, unspecified: Secondary | ICD-10-CM | POA: Diagnosis not present

## 2018-05-24 DIAGNOSIS — N528 Other male erectile dysfunction: Secondary | ICD-10-CM

## 2018-05-24 DIAGNOSIS — I2510 Atherosclerotic heart disease of native coronary artery without angina pectoris: Secondary | ICD-10-CM | POA: Diagnosis not present

## 2018-05-24 DIAGNOSIS — I6521 Occlusion and stenosis of right carotid artery: Secondary | ICD-10-CM

## 2018-05-24 DIAGNOSIS — I2583 Coronary atherosclerosis due to lipid rich plaque: Secondary | ICD-10-CM

## 2018-05-24 LAB — CBC WITH DIFFERENTIAL/PLATELET
BASOS ABS: 48 {cells}/uL (ref 0–200)
Basophils Relative: 0.8 %
EOS ABS: 102 {cells}/uL (ref 15–500)
EOS PCT: 1.7 %
HCT: 44.4 % (ref 38.5–50.0)
Hemoglobin: 15 g/dL (ref 13.2–17.1)
Lymphs Abs: 1674 cells/uL (ref 850–3900)
MCH: 31.9 pg (ref 27.0–33.0)
MCHC: 33.8 g/dL (ref 32.0–36.0)
MCV: 94.5 fL (ref 80.0–100.0)
MONOS PCT: 14.1 %
MPV: 10.5 fL (ref 7.5–12.5)
Neutro Abs: 3330 cells/uL (ref 1500–7800)
Neutrophils Relative %: 55.5 %
PLATELETS: 208 10*3/uL (ref 140–400)
RBC: 4.7 10*6/uL (ref 4.20–5.80)
RDW: 12.8 % (ref 11.0–15.0)
TOTAL LYMPHOCYTE: 27.9 %
WBC mixed population: 846 cells/uL (ref 200–950)
WBC: 6 10*3/uL (ref 3.8–10.8)

## 2018-05-24 LAB — LIPID PANEL
Cholesterol: 116 mg/dL (ref <200)
HDL: 46 mg/dL (ref >40)
LDL CHOLESTEROL (CALC): 55 mg/dL
NON-HDL CHOLESTEROL (CALC): 70 mg/dL (ref <130)
Total CHOL/HDL Ratio: 2.5 (calc) (ref <5.0)
Triglycerides: 73 mg/dL (ref <150)

## 2018-05-24 LAB — COMPLETE METABOLIC PANEL WITH GFR
AG RATIO: 1.6 (calc) (ref 1.0–2.5)
ALBUMIN MSPROF: 3.7 g/dL (ref 3.6–5.1)
ALT: 13 U/L (ref 9–46)
AST: 15 U/L (ref 10–35)
Alkaline phosphatase (APISO): 46 U/L (ref 40–115)
BUN: 22 mg/dL (ref 7–25)
CHLORIDE: 103 mmol/L (ref 98–110)
CO2: 29 mmol/L (ref 20–32)
Calcium: 9 mg/dL (ref 8.6–10.3)
Creat: 1.04 mg/dL (ref 0.70–1.11)
GFR, EST AFRICAN AMERICAN: 77 mL/min/{1.73_m2} (ref >=60)
GFR, Est Non African American: 67 mL/min/{1.73_m2} (ref >=60)
GLUCOSE: 143 mg/dL — AB (ref 65–99)
Globulin: 2.3 g/dL (calc) (ref 1.9–3.7)
Potassium: 4.2 mmol/L (ref 3.5–5.3)
Sodium: 139 mmol/L (ref 135–146)
TOTAL PROTEIN: 6 g/dL — AB (ref 6.1–8.1)
Total Bilirubin: 0.4 mg/dL (ref 0.2–1.2)

## 2018-05-24 LAB — HEMOGLOBIN A1C
EAG (MMOL/L): 8.4 (calc)
Hgb A1c MFr Bld: 6.9 % of total Hgb — ABNORMAL HIGH (ref <5.7)
MEAN PLASMA GLUCOSE: 151 (calc)

## 2018-05-24 MED ORDER — SILDENAFIL CITRATE 50 MG PO TABS: 50.0000 mg | ORAL_TABLET | Freq: Every day | ORAL | 0 refills | Status: DC | PRN

## 2018-05-24 NOTE — Progress Notes (Signed)
   Subjective:    Patient ID: Barry [name], male    DOB: [birth year], 84 y.o.   MRN: [id number]  Patient presents for Follow-up (has had labs)   Pt here to f/u chronic medical problems  Saw Cardiology Dr. Stanford Breed yesterday, given good report, no CP no SOB  Appetite has been good, no weight changes   Has psychiatry appointment tomorrow, has not had any recent changes in medications   DM- did not bring meter, reviewed labs, A1C  6.9%, no hypoglyemia symptoms Stays active by Mowing yards   SMoking now 10cig /day, previous 1ppd    Review Of Systems:  GEN- denies fatigue, fever, weight loss,weakness, recent illness HEENT- denies eye drainage, change in vision, nasal discharge, CVS- denies chest pain, palpitations RESP- denies SOB, cough, wheeze ABD- denies N/V, change in stools, abd pain GU- denies dysuria, hematuria, dribbling, incontinence MSK- denies joint pain, muscle aches, injury Neuro- denies headache, dizziness, syncope, seizure activity       Objective:    BP 138/68   Pulse 66   Temp 98.1 F (36.7 C) (Oral)   Resp 14   Ht 5\' 10"  (1.778 m)   Wt 175 lb (79.4 kg)   SpO2 95%   BMI 25.11 kg/m  GEN- NAD, alert and oriented x3 HEENT- PERRL, EOMI, non injected sclera, pink conjunctiva, MMM, oropharynx clear CVS- RRR, no murmur RESP-CTAB ABD-NABS,soft,NT,ND EXT- No edema Pulses- Radial  2+        Assessment & Plan:      Problem List Items Addressed This Visit      Unprioritized   CAD (coronary artery disease) - Primary    Controlling risk factors Following cardiology      Relevant Medications   sildenafil (VIAGRA) 50 MG tablet   Diabetes mellitus type II, controlled ([place])    Reviewed labs well controlled, no hypoglycemia symptoms No change to meds      Erectile dysfunction    Requested refill on viagra      Essential hypertension    Controlled for age, no changes Follows with cardiology      Relevant Medications   sildenafil (VIAGRA)  50 MG tablet   Hyperlipidemia    Lipids at goal no changes Weight is steady      Relevant Medications   sildenafil (VIAGRA) 50 MG tablet   TOBACCO ABUSE    He is cutting back, but unable to quit completely         Note: This dictation was prepared with Dragon dictation along with smaller phrase technology. Any transcriptional errors that result from this process are unintentional.

## 2018-05-24 NOTE — Patient Instructions (Addendum)
F/U 6 months for physical in Feb

## 2018-05-25 ENCOUNTER — Encounter: Payer: Self-pay | Admitting: Family Medicine

## 2018-05-25 NOTE — Assessment & Plan Note (Signed)
He is cutting back, but unable to quit completely

## 2018-05-25 NOTE — Assessment & Plan Note (Signed)
Controlled for age, no changes Follows with cardiology

## 2018-05-25 NOTE — Assessment & Plan Note (Signed)
Requested refill on viagra

## 2018-05-25 NOTE — Assessment & Plan Note (Signed)
Controlling risk factors Following cardiology

## 2018-05-25 NOTE — Assessment & Plan Note (Signed)
Lipids at goal no changes Weight is steady

## 2018-05-25 NOTE — Assessment & Plan Note (Signed)
Reviewed labs well controlled, no hypoglycemia symptoms No change to meds

## 2018-06-16 ENCOUNTER — Other Ambulatory Visit: Payer: Self-pay | Admitting: Family Medicine

## 2018-06-22 DIAGNOSIS — J329 Chronic sinusitis, unspecified: Secondary | ICD-10-CM | POA: Diagnosis not present

## 2018-06-26 ENCOUNTER — Ambulatory Visit (INDEPENDENT_AMBULATORY_CARE_PROVIDER_SITE_OTHER): Payer: Medicare Other | Admitting: Psychiatry

## 2018-06-26 ENCOUNTER — Encounter (HOSPITAL_COMMUNITY): Payer: Self-pay | Admitting: Psychiatry

## 2018-06-26 VITALS — BP 158/88 | HR 70 | Ht 70.0 in | Wt 175.0 lb

## 2018-06-26 DIAGNOSIS — F331 Major depressive disorder, recurrent, moderate: Secondary | ICD-10-CM

## 2018-06-26 DIAGNOSIS — Z818 Family history of other mental and behavioral disorders: Secondary | ICD-10-CM

## 2018-06-26 DIAGNOSIS — F419 Anxiety disorder, unspecified: Secondary | ICD-10-CM

## 2018-06-26 DIAGNOSIS — I6521 Occlusion and stenosis of right carotid artery: Secondary | ICD-10-CM

## 2018-06-26 DIAGNOSIS — F17210 Nicotine dependence, cigarettes, uncomplicated: Secondary | ICD-10-CM | POA: Diagnosis not present

## 2018-06-26 MED ORDER — FLUOXETINE HCL 20 MG PO CAPS: 20.0000 mg | ORAL_CAPSULE | Freq: Every morning | ORAL | 3 refills | Status: DC

## 2018-06-26 MED ORDER — LORAZEPAM 1 MG PO TABS: 1.0000 mg | ORAL_TABLET | Freq: Two times a day (BID) | ORAL | 5 refills | Status: DC

## 2018-06-26 NOTE — Progress Notes (Signed)
[facility] MD/PA/NP OP Progress Note  06/26/2018 1:20 PM Barry [name]  MRN:  [id number]  Chief Complaint:  Chief Complaint    Depression; Anxiety; Follow-up     HPI: This patient is an 84year-old widowed white male who lives alone in [place]. He has 2 children and 3 grandchildren who live in [place]. 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  The patient states that he developed anxiety and depression in his 60s. His depression worsened when his wife died suddenly 62 years ago. However he is doing very well on his current medications. He denies being depressed or anxious and he is sleeping quite well. He spends most of his time with  grandchildren and really enjoys it. His social life is quite active. He has no side effects or complaints regarding his medications  The patient returns after 6 months.  He states he has nothing new to report.  His health is been good and he has cut his cigarettes down to 10/day.  He states that his mood is good and he spends most of his time mowing his yard and other neighbors yards and he really enjoys it.  He is sleeping well and denies any symptoms of depression or severe anxiety.  He is no longer having nightmares. Visit Diagnosis:    ICD-10-CM   1. Major depressive disorder, recurrent episode, moderate (HCC) F33.1     Past Psychiatric History: Long-term outpatient treatment  Past Medical History:  Past Medical History:  Diagnosis Date  . AAA (abdominal aortic aneurysm) (HCC) 3-09   4.7 cm  . Allergy    rhinitis  . Anxiety   . Cancer ([place])    skin  . Cataract   . Depression   . Diabetes mellitus   . Diverticulosis   . GERD (gastroesophageal reflux disease)   . Hepatitis   . Hx of colonic polyps 08/26/2015  . Hyperlipidemia   . Hypertension   . Nephrolithiasis   . Personal history of colonic polyps    adenomas, serrated also  . PVD (peripheral vascular disease) ([place])   . Tobacco abuse     Past Surgical  History:  Procedure Laterality Date  . COLONOSCOPY  2006, 2008, 2010, 05/27/2011   numerous adenomas - 13 in 2006, 3, 2008, 2 2012 (up to 1 cm), 4 diminutive adenomas, serrated adenomas 2012. Diverticulosis and hemorrhoids also.  [facility] EYE SURGERY    . EYE SURGERY Left   . Kidney stone operation    . lower limb amputation, other toe 5th    . percutaneous stent graft repair of infrarenal AAA  11/10   5.6 cm (T. early)  . surgical excision basal cell carcinoma    . tympanic eardrum repair    . VASECTOMY      Family Psychiatric History: See below  Family History:  Family History  Problem Relation Age of Onset  . Emphysema Father   . Cancer Brother        Lung  . Depression Brother   . Depression Sister   . Lung disease Sister        Fibrosis and died from pneumonia on 04/14/13  . Kidney disease Sister   . Diabetes Sister   . Learning disabilities Sister   . Mental retardation Sister   . Heart disease Mother   . Cancer Brother   . Colon cancer Neg Hx   . Dementia Neg Hx   . Alcohol abuse Neg Hx   .  Drug abuse Neg Hx   . Paranoid behavior Neg Hx   . Schizophrenia Neg Hx   . Anxiety disorder Neg Hx   . Bipolar disorder Neg Hx   . OCD Neg Hx   . Sexual abuse Neg Hx   . Physical abuse Neg Hx   . Seizures Neg Hx   . Esophageal cancer Neg Hx   . Stomach cancer Neg Hx   . Rectal cancer Neg Hx     Social History:  Social History   Socioeconomic History  . Marital status: Widowed    Spouse name: Not on file  . Number of children: 2  . Years of education: Not on file  . Highest education level: Not on file  Occupational History    Employer: RETIRED  Social Needs  . Financial resource strain: Not on file  . Food insecurity:    Worry: Not on file    Inability: Not on file  . Transportation needs:    Medical: Not on file    Non-medical: Not on file  Tobacco Use  . Smoking status: Current Every Day Smoker    Packs/day: 1.00    Years: 60.00    Pack years: 60.00     Types: Cigarettes  . Smokeless tobacco: Never Used  . Tobacco comment: smokes about 16-20 cigs a day as of 04/04/2013  Substance and Sexual Activity  . Alcohol use: No  . Drug use: No  . Sexual activity: Yes    Partners: Female    Birth control/protection: Surgical    Comment: vasectomy  Lifestyle  . Physical activity:    Days per week: Not on file    Minutes per session: Not on file  . Stress: Not on file  Relationships  . Social connections:    Talks on phone: Not on file    Gets together: Not on file    Attends religious service: Not on file    Active member of club or organization: Not on file    Attends meetings of clubs or organizations: Not on file    Relationship status: Not on file  Other Topics Concern  . 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 retired 65 years - keeping busy. Lives alone. 1- step-daughter, 1 son - '68; 3 grandchildren. does odd-jobs, yard work    Allergies:  Allergies  Allergen Reactions  . Lisinopril-Hydrochlorothiazide Swelling    ZESTRIL  . Doxycycline Rash    Metabolic Disorder Labs: Lab Results  Component Value Date   HGBA1C 6.9 (H) 05/23/2018   MPG 151 05/23/2018   MPG 151 11/17/2017   No results found for: PROLACTIN Lab Results  Component Value Date   CHOL 116 05/23/2018   TRIG 73 05/23/2018   HDL 46 05/23/2018   CHOLHDL 2.5 05/23/2018   VLDL 17 05/26/2017   LDLCALC 55 05/23/2018   LDLCALC 57 11/17/2017   Lab Results  Component Value Date   TSH 2.080 02/05/2015   TSH 1.59 06/06/2012    Therapeutic Level Labs: No results found for: LITHIUM No results found for: VALPROATE No components found for:  CBMZ  Current Medications: Current Outpatient Medications  Medication Sig Dispense Refill  . aspirin EC 81 MG tablet Take 81 mg by mouth every morning.    . ezetimibe (ZETIA) 10 MG tablet TAKE 1 TABLET BY MOUTH DAILY 90 tablet 2  . FLUoxetine (PROZAC) 20 MG capsule Take 1 capsule  (20 mg total) by mouth  every morning. 90 capsule 3  . glimepiride (AMARYL) 1 MG tablet TAKE 1 TABLET BY MOUTH TWICE DAILY 180 tablet 3  . hydrochlorothiazide (HYDRODIURIL) 25 MG tablet TAKE 1 TABLET BY MOUTH DAILY 90 tablet 0  . LORazepam (ATIVAN) 1 MG tablet Take 1 tablet (1 mg total) by mouth 2 (two) times daily. 60 tablet 5  . metoprolol succinate (TOPROL-XL) 25 MG 24 hr tablet Take 0.5 tablets (12.5 mg total) by mouth daily. 45 tablet 3  . NIFEdipine (PROCARDIA-XL/ADALAT-CC/NIFEDICAL-XL) 30 MG 24 hr tablet TAKE 1 TABLET BY MOUTH DAILY 90 tablet 0  . ramipril (ALTACE) 10 MG capsule TAKE ONE CAPSULE BY MOUTH DAILY 90 capsule 0  . sildenafil (VIAGRA) 50 MG tablet Take 1 tablet (50 mg total) by mouth daily as needed for erectile dysfunction. 6 tablet 0  . simvastatin (ZOCOR) 40 MG tablet Take 1 tablet (40 mg total) by mouth at bedtime. 90 tablet 3   No current facility-administered medications for this visit.      Musculoskeletal: Strength & Muscle Tone: within normal limits Gait & Station: normal Patient leans: N/A  Psychiatric Specialty Exam: Review of Systems  HENT: Positive for hearing loss.   All other systems reviewed and are negative.   Blood pressure (!) 158/88, pulse 70, height 5\' 10"  (1.778 m), weight 175 lb (79.4 kg), SpO2 95 %.Body mass index is 25.11 kg/m.  General Appearance: Casual and Fairly Groomed  Eye Contact:  Good  Speech:  Clear and Coherent  Volume:  Normal  Mood:  Euthymic  Affect:  Appropriate and Congruent  Thought Process:  Goal Directed  Orientation:  Full (Time, Place, and Person)  Thought Content: WDL   Suicidal Thoughts:  No  Homicidal Thoughts:  No  Memory:  Immediate;   Good Recent;   Good Remote;   Good  Judgement:  Good  Insight:  Fair  Psychomotor Activity:  Normal  Concentration:  Concentration: Good and Attention Span: Good  Recall:  Good  Fund of Knowledge: Good  Language: Good  Akathisia:  no  Handed:  Right  AIMS (if  indicated): not done  Assets:  Communication Skills Desire for Improvement Physical Health Resilience Social Support Talents/Skills  ADL's:  Intact  Cognition: WNL  Sleep:  Good   Screenings: PHQ2-9     Office Visit from 05/24/2018 in [facility] Office Visit from 11/18/2017 in [facility] Office Visit from 01/28/2017 in [facility] Office Visit from 07/12/2016 in [facility] Office Visit from 02/17/2015 in [facility]  PHQ-2 Total Score  0  0  0  0  0  PHQ-9 Total Score  -  0  0  0  0       Assessment and Plan: Patient is an 84 year old male with a history of depression and anxiety.  His symptoms seem to be very well controlled.  He will continue Prozac 20 mg daily for depression and Ativan 1 mg twice daily for anxiety.  He will return to see me in 6 months   [name], MD 06/26/2018, 1:20 PM

## 2018-07-05 ENCOUNTER — Encounter: Payer: Self-pay | Admitting: Family Medicine

## 2018-07-05 ENCOUNTER — Ambulatory Visit (INDEPENDENT_AMBULATORY_CARE_PROVIDER_SITE_OTHER): Payer: Medicare Other | Admitting: Family Medicine

## 2018-07-05 ENCOUNTER — Other Ambulatory Visit: Payer: Self-pay

## 2018-07-05 VITALS — BP 144/72 | HR 60 | Temp 98.1°F | Resp 16 | Ht 70.0 in | Wt 176.0 lb

## 2018-07-05 DIAGNOSIS — J019 Acute sinusitis, unspecified: Secondary | ICD-10-CM

## 2018-07-05 DIAGNOSIS — Z9109 Other allergy status, other than to drugs and biological substances: Secondary | ICD-10-CM

## 2018-07-05 DIAGNOSIS — H6092 Unspecified otitis externa, left ear: Secondary | ICD-10-CM

## 2018-07-05 DIAGNOSIS — I6521 Occlusion and stenosis of right carotid artery: Secondary | ICD-10-CM

## 2018-07-05 MED ORDER — NEOMYCIN-POLYMYXIN-HC 3.5-10000-1 OT SOLN: 4.0000 [drp] | Freq: Four times a day (QID) | OTIC | 0 refills | Status: AC

## 2018-07-05 MED ORDER — AZITHROMYCIN 250 MG PO TABS: ORAL_TABLET | ORAL | 0 refills | Status: DC

## 2018-07-05 MED ORDER — PREDNISONE 20 MG PO TABS: 40.0000 mg | ORAL_TABLET | Freq: Every day | ORAL | 0 refills | Status: AC

## 2018-07-05 MED ORDER — CETIRIZINE HCL 10 MG PO TABS: 10.0000 mg | ORAL_TABLET | Freq: Every day | ORAL | 11 refills | Status: DC

## 2018-07-05 MED ORDER — MONTELUKAST SODIUM 10 MG PO TABS: 10.0000 mg | ORAL_TABLET | Freq: Every day | ORAL | 3 refills | Status: DC

## 2018-07-05 MED ORDER — IPRATROPIUM BROMIDE 0.03 % NA SOLN: 2.0000 | Freq: Two times a day (BID) | NASAL | 12 refills | Status: DC

## 2018-07-05 NOTE — Patient Instructions (Signed)
Eustachian Tube Dysfunction The eustachian tube connects the middle ear to the back of the nose. It regulates air pressure in the middle ear by allowing air to move between the ear and nose. It also helps to drain fluid from the middle ear space. When the eustachian tube does not function properly, air pressure, fluid, or both can build up in the middle ear. Eustachian tube dysfunction can affect one or both ears. What are the causes? This condition happens when the eustachian tube becomes blocked or cannot open normally. This may result from:  Ear infections.  Colds and other upper respiratory infections.  Allergies.  Irritation, such as from cigarette smoke or acid from the stomach coming up into the esophagus (gastroesophageal reflux).  Sudden changes in air pressure, such as from descending in an airplane.  Abnormal growths in the nose or throat, such as nasal polyps, tumors, or enlarged tissue at the back of the throat (adenoids).  What increases the risk? This condition may be more likely to develop in people who smoke and people who are overweight. Eustachian tube dysfunction may also be more likely to develop in children, especially children who have:  Certain birth defects of the mouth, such as cleft palate.  Large tonsils and adenoids.  What are the signs or symptoms? Symptoms of this condition may include:  A feeling of fullness in the ear.  Ear pain.  Clicking or popping noises in the ear.  Ringing in the ear.  Hearing loss.  Loss of balance.  Symptoms may get worse when the air pressure around you changes, such as when you travel to an area of high elevation or fly on an airplane. How is this diagnosed? This condition may be diagnosed based on:  Your symptoms.  A physical exam of your ear, nose, and throat.  Tests, such as those that measure: ? The movement of your eardrum (tympanogram). ? Your hearing (audiometry).  How is this treated? Treatment  depends on the cause and severity of your condition. If your symptoms are mild, you may be able to relieve your symptoms by moving air into ("popping") your ears. If you have symptoms of fluid in your ears, treatment may include:  Decongestants.  Antihistamines.  Nasal sprays or ear drops that contain medicines that reduce swelling (steroids).  In some cases, you may need to have a procedure to drain the fluid in your eardrum (myringotomy). In this procedure, a small tube is placed in the eardrum to:  Drain the fluid.  Restore the air in the middle ear space.  Follow these instructions at home:  Take over-the-counter and prescription medicines only as told by your health care provider.  Use techniques to help pop your ears as recommended by your health care provider. These may include: ? Chewing gum. ? Yawning. ? Frequent, forceful swallowing. ? Closing your mouth, holding your nose closed, and gently blowing as if you are trying to blow air out of your nose.  Do not do any of the following until your health care provider approves: ? Travel to high altitudes. ? Fly in airplanes. ? Work in a pressurized cabin or room. ? Scuba dive.  Keep your ears dry. Dry your ears completely after showering or bathing.  Do not smoke.  Keep all follow-up visits as told by your health care provider. This is important. Contact a health care provider if:  Your symptoms do not go away after treatment.  Your symptoms come back after treatment.  You are   unable to pop your ears.  You have: ? A fever. ? Pain in your ear. ? Pain in your head or neck. ? Fluid draining from your ear.  Your hearing suddenly changes.  You become very dizzy.  You lose your balance. This information is not intended to replace advice given to you by your health care provider. Make sure you discuss any questions you have with your health care provider. Document Released: 10/31/2015 Document Revised: 03/11/2016  Document Reviewed: 10/23/2014 Elsevier Interactive Patient Education  2018 [employer]. Sinusitis, Adult Sinusitis is soreness and inflammation of your sinuses. Sinuses are hollow spaces in the bones around your face. They are located:  Around your eyes.  In the middle of your forehead.  Behind your nose.  In your cheekbones.  Your sinuses and nasal passages are lined with a stringy fluid (mucus). Mucus normally drains out of your sinuses. When your nasal tissues get inflamed or swollen, the mucus can get trapped or blocked so air cannot flow through your sinuses. This lets bacteria, viruses, and funguses grow, and that leads to infection. Follow these instructions at home: Medicines  Take, use, or apply over-the-counter and prescription medicines only as told by your doctor. These may include nasal sprays.  If you were prescribed an antibiotic medicine, take it as told by your doctor. Do not stop taking the antibiotic even if you start to feel better. Hydrate and Humidify  Drink enough water to keep your pee (urine) clear or pale yellow.  Use a cool mist humidifier to keep the humidity level in your home above 50%.  Breathe in steam for 10-15 minutes, 3-4 times a day or as told by your doctor. You can do this in the bathroom while a hot shower is running.  Try not to spend time in cool or dry air. Rest  Rest as much as possible.  Sleep with your head raised (elevated).  Make sure to get enough sleep each night. General instructions  Put a warm, moist washcloth on your face 3-4 times a day or as told by your doctor. This will help with discomfort.  Wash your hands often with soap and water. If there is no soap and water, use hand sanitizer.  Do not smoke. Avoid being around people who are smoking (secondhand smoke).  Keep all follow-up visits as told by your doctor. This is important. Contact a doctor if:  You have a fever.  Your symptoms get worse.  Your symptoms  do not get better within 10 days. Get help right away if:  You have a very bad headache.  You cannot stop throwing up (vomiting).  You have pain or swelling around your face or eyes.  You have trouble seeing.  You feel confused.  Your neck is stiff.  You have trouble breathing. This information is not intended to replace advice given to you by your health care provider. Make sure you discuss any questions you have with your health care provider. Document Released: 03/22/2008 Document Revised: 05/30/2016 Document Reviewed: 07/30/2015 Elsevier Interactive Patient Education  [employer].

## 2018-07-05 NOTE — Progress Notes (Signed)
Patient ID: Barry [name], male    DOB: [birth year], 84 y.o.   MRN: [id number]  PCP: Alycia Rossetti, MD  Chief Complaint  Patient presents with  . Illness    x weeks- was seen at [facility] and given ABTx (Ceftin) on 06/22/2018 for sinus infection and ear infection- reports that he still has sinus pressure, HA, ear pain    Subjective:   Barry [name] is a 84 y.o. male, presents to clinic with CC of sinus pressure Ha and ear pain. Sick a few days and then went to urgent care 2 weeks ago for illness.  Started after mowing grass Stuffy nose, drainage, draining eyes,  Feel like they got sand in them, itchy, sneezing,  No pain in the ears UC was told he had ear infection and sinus infection, tx with ceftin, no change, feels gradaully worse.   Sinus pressure HA,   Patient Active Problem List   Diagnosis Date Noted  . Carotid artery disease ([place]) 11/18/2017  . Hx of colonic polyps 08/26/2015  . Erectile dysfunction 02/17/2015  . AAA (abdominal aortic aneurysm) without rupture ([place]) 05/21/2014  . Chest pain 01/31/2014  . CAD (coronary artery disease) 12/24/2013  . Routine health maintenance 07/13/2011  . HEARING LOSS, CONDUCTIVE, LEFT 02/23/2010  . DIZZINESS, CHRONIC 05/28/2008  . Abdominal aortic aneurysm ([place]) 12/13/2007  . PULMONARY NODULE 09/07/2007  . Diabetes mellitus type II, controlled ([place]) 09/06/2007  . Hyperlipidemia 09/06/2007  . ANXIETY DEPRESSION 09/06/2007  . TOBACCO ABUSE 09/06/2007  . Essential hypertension 09/06/2007  . ALLERGIC RHINITIS 09/06/2007  . GERD 09/06/2007  . BASAL CELL CARCINOMA, NOSE 09/06/2007     Prior to Admission medications   Medication Sig Start Date End Date Taking? Authorizing Provider  aspirin EC 81 MG tablet Take 81 mg by mouth every morning.   Yes [provider]  ezetimibe (ZETIA) 10 MG tablet TAKE 1 TABLET BY MOUTH DAILY 02/21/18  Yes Lelon Perla, MD  FLUoxetine (PROZAC) 20 MG capsule Take 1 capsule (20 mg total) by  mouth every morning. 06/26/18  Yes Cloria Spring, MD  glimepiride (AMARYL) 1 MG tablet TAKE 1 TABLET BY MOUTH TWICE DAILY 02/20/18  Yes [place], Modena Nunnery, MD  hydrochlorothiazide (HYDRODIURIL) 25 MG tablet TAKE 1 TABLET BY MOUTH DAILY 05/08/18  Yes [place], Modena Nunnery, MD  LORazepam (ATIVAN) 1 MG tablet Take 1 tablet (1 mg total) by mouth 2 (two) times daily. 06/26/18 06/26/19 Yes Cloria Spring, MD  metoprolol succinate (TOPROL-XL) 25 MG 24 hr tablet Take 0.5 tablets (12.5 mg total) by mouth daily. 09/29/17  Yes [place], Modena Nunnery, MD  NIFEdipine (PROCARDIA-XL/ADALAT-CC/NIFEDICAL-XL) 30 MG 24 hr tablet TAKE 1 TABLET BY MOUTH DAILY 04/26/18  Yes [place], Modena Nunnery, MD  ramipril (ALTACE) 10 MG capsule TAKE ONE CAPSULE BY MOUTH DAILY 06/16/18  Yes [place], Modena Nunnery, MD  sildenafil (VIAGRA) 50 MG tablet Take 1 tablet (50 mg total) by mouth daily as needed for erectile dysfunction. 05/24/18  Yes [place], Modena Nunnery, MD  simvastatin (ZOCOR) 40 MG tablet Take 1 tablet (40 mg total) by mouth at bedtime. 09/15/17  Yes , Modena Nunnery, MD     Allergies  Allergen Reactions  . Lisinopril-Hydrochlorothiazide Swelling    ZESTRIL  . Doxycycline Rash    Review of Systems  Constitutional: Negative.   HENT: Negative.   Eyes: Negative.   Respiratory: Negative.   Cardiovascular: Negative.   Gastrointestinal: Negative.   Endocrine: Negative.   Genitourinary: Negative.  Musculoskeletal: Negative.   Skin: Negative.   Allergic/Immunologic: Negative.   Neurological: Negative.   Hematological: Negative.   Psychiatric/Behavioral: Negative.   All other systems reviewed and are negative.      Objective:    Vitals:   07/05/18 1010  BP: (!) 144/72  Pulse: 60  Resp: 16  Temp: 98.1 F (36.7 C)  TempSrc: Oral  SpO2: 95%  Weight: 176 lb (79.8 kg)  Height: 5\' 10"  (1.778 m)      Physical Exam  Constitutional: He is oriented to person, place, and time. He appears well-developed and well-nourished.  Non-toxic  appearance. He does not appear ill. No distress.  HENT:  Head: Normocephalic and atraumatic.  Right Ear: Tympanic membrane, external ear and ear canal normal.  Left Ear: Tympanic membrane, external ear and ear canal normal.  Nose: No mucosal edema or rhinorrhea. Right sinus exhibits no maxillary sinus tenderness and no frontal sinus tenderness. Left sinus exhibits no maxillary sinus tenderness and no frontal sinus tenderness.  Mouth/Throat: Uvula is midline. No trismus in the jaw. No uvula swelling. No oropharyngeal exudate, posterior oropharyngeal edema or posterior oropharyngeal erythema.  Nasal mucosal edema with clear discharge No sinus ttp Op normal Left ear canal with mild erythema and edema, no exudate, left TM normal  Eyes: Pupils are equal, round, and reactive to light. Conjunctivae, EOM and lids are normal. Right eye exhibits no discharge. Left eye exhibits no discharge.  Neck: Trachea normal, normal range of motion and phonation normal. Neck supple. No tracheal deviation present.  Cardiovascular: Normal rate, regular rhythm, normal heart sounds, intact distal pulses and normal pulses. Exam reveals no gallop and no friction rub.  No murmur heard. Pulses:      Radial pulses are 2+ on the right side, and 2+ on the left side.       Posterior tibial pulses are 2+ on the right side, and 2+ on the left side.  Pulmonary/Chest: Effort normal and breath sounds normal. No stridor. No respiratory distress. He has no wheezes. He has no rhonchi. He has no rales.  Abdominal: Soft. Normal appearance and bowel sounds are normal. He exhibits no distension. There is no tenderness. There is no guarding.  Musculoskeletal: Normal range of motion. He exhibits no edema.  Lymphadenopathy:    He has no cervical adenopathy.  Neurological: He is alert and oriented to person, place, and time. He exhibits normal muscle tone. Coordination and gait normal.  Skin: Skin is warm, dry and intact. Capillary refill  takes less than 2 seconds. No rash noted. He is not diaphoretic.  Psychiatric: He has a normal mood and affect. His speech is normal and behavior is normal.  Nursing note and vitals reviewed.         Assessment & Plan:      ICD-10-CM   1. Acute sinusitis, recurrence not specified, unspecified location J01.90 predniSONE (DELTASONE) 20 MG tablet    cetirizine (ZYRTEC) 10 MG tablet    ipratropium (ATROVENT) 0.03 % nasal spray    montelukast (SINGULAIR) 10 MG tablet    azithromycin (ZITHROMAX) 250 MG tablet   suspect from uncontrolled allergies  2. Environmental allergies Z91.09 cetirizine (ZYRTEC) 10 MG tablet    montelukast (SINGULAIR) 10 MG tablet   avoid triggers, maximize allergy meds  3. Otitis externa of left ear, unspecified chronicity, unspecified type H60.92 neomycin-polymyxin-hydrocortisone (CORTISPORIN) OTIC solution   suspect from instrumentation, topical abx with steroid for relief, but some otalgia is from eustachian tube dysfunction  Zpak to hold, try treating allergies and steroids first.   Delsa Grana, PA-C 07/05/18 10:22 AM

## 2018-07-18 ENCOUNTER — Other Ambulatory Visit: Payer: Self-pay

## 2018-07-18 DIAGNOSIS — Z95828 Presence of other vascular implants and grafts: Secondary | ICD-10-CM

## 2018-07-18 DIAGNOSIS — I714 Abdominal aortic aneurysm, without rupture, unspecified: Secondary | ICD-10-CM

## 2018-07-19 ENCOUNTER — Other Ambulatory Visit: Payer: Self-pay

## 2018-07-19 ENCOUNTER — Encounter: Payer: Self-pay | Admitting: Family Medicine

## 2018-07-19 ENCOUNTER — Ambulatory Visit (INDEPENDENT_AMBULATORY_CARE_PROVIDER_SITE_OTHER): Payer: Medicare Other | Admitting: Family Medicine

## 2018-07-19 VITALS — BP 138/78 | HR 68 | Temp 97.9°F | Resp 16 | Ht 70.0 in | Wt 174.0 lb

## 2018-07-19 DIAGNOSIS — J019 Acute sinusitis, unspecified: Secondary | ICD-10-CM | POA: Diagnosis not present

## 2018-07-19 DIAGNOSIS — R42 Dizziness and giddiness: Secondary | ICD-10-CM

## 2018-07-19 DIAGNOSIS — Z9109 Other allergy status, other than to drugs and biological substances: Secondary | ICD-10-CM | POA: Diagnosis not present

## 2018-07-19 DIAGNOSIS — H6691 Otitis media, unspecified, right ear: Secondary | ICD-10-CM

## 2018-07-19 DIAGNOSIS — I6521 Occlusion and stenosis of right carotid artery: Secondary | ICD-10-CM

## 2018-07-19 MED ORDER — AMOXICILLIN-POT CLAVULANATE ER 1000-62.5 MG PO TB12: 2.0000 | ORAL_TABLET | Freq: Two times a day (BID) | ORAL | 0 refills | Status: AC

## 2018-07-19 NOTE — Patient Instructions (Signed)
Very strong dose of antibiotics was sent to your pharmacy, please take as prescribed, may need to use a probiotic because you may have diarrhea or stomach upset.  Asked the pharmacy about best how to take the antibiotic.  I would try to take a antihistamine at night before going to bed such as Zyrtec, Claritin or Allegra.  See if you can tolerate that without so much jitteriness.  If it prevents you from sleeping then you may want to try a lower dose such as 5 mg at night or during the daytime to see if that helps.  Continue the steroids you just started.  Do a daily steroid nasal spray  Continue to try and take montelukast at night with the antihistamine to also block swelling fluids and allergies that may be causing the sinus congestion drainage and infections.  He can continue doing saline rinses and Nettie pot as long as it is clean distilled water.  ENT referral was placed and they will call you for setting up an appointment, please call our office if you do not hear from ENT or our office in 1 to 2 weeks

## 2018-07-19 NOTE — Progress Notes (Signed)
Patient ID: Barry [name], male    DOB: [birth year], 84 y.o.   MRN: [id number]  PCP: Alycia Rossetti, MD  Chief Complaint  Patient presents with  . Referral    would like referral for [facility]    Subjective:   Barry [name] is a 84 y.o. male, presents to clinic with CC of sinusitis not improving.  He did start taking the zpak and two days ago started taking prednisone.    Pt stopped doing antihistamines, states nasal sprays "tears me up" he has had several months of recurrent and worsening sinus congestion and drainage.    Denies any headaches or fevers  Patient Active Problem List   Diagnosis Date Noted  . Carotid artery disease ([place]) 11/18/2017  . Hx of colonic polyps 08/26/2015  . Erectile dysfunction 02/17/2015  . AAA (abdominal aortic aneurysm) without rupture ([place]) 05/21/2014  . Chest pain 01/31/2014  . CAD (coronary artery disease) 12/24/2013  . Routine health maintenance 07/13/2011  . HEARING LOSS, CONDUCTIVE, LEFT 02/23/2010  . DIZZINESS, CHRONIC 05/28/2008  . Abdominal aortic aneurysm ([place]) 12/13/2007  . PULMONARY NODULE 09/07/2007  . Diabetes mellitus type II, controlled ([place]) 09/06/2007  . Hyperlipidemia 09/06/2007  . ANXIETY DEPRESSION 09/06/2007  . TOBACCO ABUSE 09/06/2007  . Essential hypertension 09/06/2007  . ALLERGIC RHINITIS 09/06/2007  . GERD 09/06/2007  . BASAL CELL CARCINOMA, NOSE 09/06/2007     Prior to Admission medications   Medication Sig Start Date End Date Taking? Authorizing Provider  aspirin EC 81 MG tablet Take 81 mg by mouth every morning.   Yes [provider]  ezetimibe (ZETIA) 10 MG tablet TAKE 1 TABLET BY MOUTH DAILY 02/21/18  Yes Lelon Perla, MD  FLUoxetine (PROZAC) 20 MG capsule Take 1 capsule (20 mg total) by mouth every morning. 06/26/18  Yes Cloria Spring, MD  glimepiride (AMARYL) 1 MG tablet TAKE 1 TABLET BY MOUTH TWICE DAILY 02/20/18  Yes [place], Modena Nunnery, MD  hydrochlorothiazide (HYDRODIURIL) 25 MG  tablet TAKE 1 TABLET BY MOUTH DAILY 05/08/18  Yes [place], Modena Nunnery, MD  LORazepam (ATIVAN) 1 MG tablet Take 1 tablet (1 mg total) by mouth 2 (two) times daily. 06/26/18 06/26/19 Yes Cloria Spring, MD  metoprolol succinate (TOPROL-XL) 25 MG 24 hr tablet Take 0.5 tablets (12.5 mg total) by mouth daily. 09/29/17  Yes [place], Modena Nunnery, MD  NIFEdipine (PROCARDIA-XL/ADALAT-CC/NIFEDICAL-XL) 30 MG 24 hr tablet TAKE 1 TABLET BY MOUTH DAILY 04/26/18  Yes [place], Modena Nunnery, MD  ramipril (ALTACE) 10 MG capsule TAKE ONE CAPSULE BY MOUTH DAILY 06/16/18  Yes [place], Modena Nunnery, MD  simvastatin (ZOCOR) 40 MG tablet Take 1 tablet (40 mg total) by mouth at bedtime. 09/15/17  Yes [place], Modena Nunnery, MD     Allergies  Allergen Reactions  . Lisinopril-Hydrochlorothiazide Swelling    ZESTRIL  . Doxycycline Rash     Family History  Problem Relation Age of Onset  . Emphysema Father   . Cancer Brother        Lung  . Depression Brother   . Depression Sister   . Lung disease Sister        Fibrosis and died from pneumonia on 04-19-2013  . Kidney disease Sister   . Diabetes Sister   . Learning disabilities Sister   . Mental retardation Sister   . Heart disease Mother   . Cancer Brother   . Colon cancer Neg Hx   . Dementia Neg Hx   .  Alcohol abuse Neg Hx   . Drug abuse Neg Hx   . Paranoid behavior Neg Hx   . Schizophrenia Neg Hx   . Anxiety disorder Neg Hx   . Bipolar disorder Neg Hx   . OCD Neg Hx   . Sexual abuse Neg Hx   . Physical abuse Neg Hx   . Seizures Neg Hx   . Esophageal cancer Neg Hx   . Stomach cancer Neg Hx   . Rectal cancer Neg Hx      Social History   Socioeconomic History  . Marital status: Widowed    Spouse name: Not on file  . Number of children: 2  . Years of education: Not on file  . Highest education level: Not on file  Occupational History    Employer: RETIRED  Social Needs  . Financial resource strain: Not on file  . Food insecurity:    Worry: Not on file     Inability: Not on file  . Transportation needs:    Medical: Not on file    Non-medical: Not on file  Tobacco Use  . Smoking status: Current Every Day Smoker    Packs/day: 1.00    Years: 60.00    Pack years: 60.00    Types: Cigarettes  . Smokeless tobacco: Never Used  . Tobacco comment: smokes about 16-20 cigs a day as of 04/04/2013  Substance and Sexual Activity  . Alcohol use: No  . Drug use: No  . Sexual activity: Yes    Partners: Female    Birth control/protection: Surgical    Comment: vasectomy  Lifestyle  . Physical activity:    Days per week: Not on file    Minutes per session: Not on file  . Stress: Not on file  Relationships  . Social connections:    Talks on phone: Not on file    Gets together: Not on file    Attends religious service: Not on file    Active member of club or organization: Not on file    Attends meetings of clubs or organizations: Not on file    Relationship status: Not on file  . Intimate partner violence:    Fear of current or ex partner: Not on file    Emotionally abused: Not on file    Physically abused: Not on file    Forced sexual activity: Not on file  Other Topics Concern  . 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 Lives alone. 1- step-daughter, 1 son - '68; 3 grandchildren. does odd-jobs, yard work     Review of Systems     Objective:    Vitals:   07/19/18 1005  BP: 138/78  Pulse: 68  Resp: 16  Temp: 97.9 F (36.6 C)  TempSrc: Oral  SpO2: 98%  Weight: 174 lb (78.9 kg)  Height: 5\' 10"  (1.778 m)      Physical Exam  Constitutional: He is oriented to person, place, and time. He appears well-developed and well-nourished.  Non-toxic appearance. He does not appear ill. No distress.  HENT:  Head: Normocephalic and atraumatic.  Right Ear: Tympanic membrane, external ear and ear canal normal.  Left Ear: Tympanic membrane, external ear and ear canal  normal.  Nose: No mucosal edema or rhinorrhea. Right sinus exhibits no maxillary sinus tenderness and no frontal sinus tenderness. Left sinus exhibits no maxillary sinus tenderness and no frontal sinus tenderness.  Mouth/Throat: Uvula  is midline. No trismus in the jaw. No uvula swelling. No oropharyngeal exudate, posterior oropharyngeal edema or posterior oropharyngeal erythema.  Nasal mucosa edematous erythematous with copious purulent discharge No sinus ttp Op normal Left TM, external auditory canal and external ear normal appearing, no erythema or edema  Right TM opaque, erythematous, intact   Eyes: Pupils are equal, round, and reactive to light. Conjunctivae, EOM and lids are normal. Right eye exhibits no discharge. Left eye exhibits no discharge.  Neck: Trachea normal, normal range of motion and phonation normal. Neck supple. No tracheal deviation present.  Cardiovascular: Normal rate, regular rhythm, normal heart sounds, intact distal pulses and normal pulses. Exam reveals no gallop and no friction rub.  No murmur heard. Pulses:      Radial pulses are 2+ on the right side, and 2+ on the left side.       Posterior tibial pulses are 2+ on the right side, and 2+ on the left side.  Pulmonary/Chest: Effort normal and breath sounds normal. No stridor. No respiratory distress. He has no wheezes. He has no rhonchi. He has no rales.  Abdominal: Soft. Normal appearance and bowel sounds are normal. He exhibits no distension. There is no tenderness. There is no guarding.  Musculoskeletal: Normal range of motion. He exhibits no edema.  Lymphadenopathy:    He has no cervical adenopathy.  Neurological: He is alert and oriented to person, place, and time. He exhibits normal muscle tone. Coordination and gait normal.  Skin: Skin is warm, dry and intact. Capillary refill takes less than 2 seconds. No rash noted. He is not diaphoretic.  Psychiatric: He has a normal mood and affect. His speech is normal  and behavior is normal.  Nursing note and vitals reviewed.    Right TM opaque, erythematous, intact Nasal mucosa edematous erythematous with copious purulent discharge     Assessment & Plan:      ICD-10-CM   1. Acute sinusitis, recurrence not specified, unspecified location J01.90 Ambulatory referral to ENT    amoxicillin-clavulanate (AUGMENTIN XR) 1000-62.5 MG 12 hr tablet  2. Environmental allergies Z91.09 Ambulatory referral to ENT  3. Right otitis media, unspecified otitis media type H66.91 amoxicillin-clavulanate (AUGMENTIN XR) 1000-62.5 MG 12 hr tablet  4. DIZZINESS, CHRONIC R42 Ambulatory referral to ENT    84 year old male with recurrent sinusitis, not improved with treatment with antihistamines, montelukast, nasal sprays and steroids.  He was noncompliant with all the treatment at the same time, states that he has some jitteriness with Zyrtec or Claritin, unclear whether he has was able to retry the medications, he was encouraged today to try them at night.  He stated emphatically at last visit that Z-Pak is the only thing that helps him clear sinus infection, I gave him supportive treatment first for suspected viral infection, printed the prescription for Z-Pak on paper and encouraged him to hold it for several days to see if the other medications helped first.  He does state that he took it without any improvement or change to his symptoms.  He started steroids 2 days ago.  He continues to have severe head congestion pressure and purulent discharge and ear pain.  He requests ENT referral which I agree with. He also has a reported lifelong history of vertigo, which ENT may also be helpful in evaluating.  He reports that his son was seen by ENT for vertigo 2 years ago and with multiple maneuvers in office vertigo was resolved.   Delsa Grana, PA-C 07/19/18 10:24 AM

## 2018-07-21 ENCOUNTER — Telehealth: Payer: Self-pay | Admitting: Family Medicine

## 2018-07-21 NOTE — Telephone Encounter (Signed)
Spoke with patient and informed him medications was being adjusted and pharmacy will contact when ready. Patient verbalized understanding.

## 2018-07-21 NOTE — Telephone Encounter (Signed)
Note is completed, I did call and talk to the pharmacist and they are adjusting the prescription for him, call him when they have it ready

## 2018-07-21 NOTE — Telephone Encounter (Signed)
Patient called in to inquiry about his referral to see if we had gotten him scheduled already. Have not been able to process the referral yet awaiting note. Also patient states that the pharmacy called in reference to the Augmentin stating they needed clarification on the dose or they did not have the strength. Patient wants to know what will call in for him for his ear infection. Please advise?

## 2018-08-01 ENCOUNTER — Ambulatory Visit: Payer: Medicare Other | Admitting: Vascular Surgery

## 2018-08-01 ENCOUNTER — Other Ambulatory Visit (HOSPITAL_COMMUNITY): Payer: Self-pay

## 2018-08-01 DIAGNOSIS — J3489 Other specified disorders of nose and nasal sinuses: Secondary | ICD-10-CM | POA: Insufficient documentation

## 2018-08-01 DIAGNOSIS — H6982 Other specified disorders of Eustachian tube, left ear: Secondary | ICD-10-CM | POA: Diagnosis not present

## 2018-08-01 DIAGNOSIS — H6992 Unspecified Eustachian tube disorder, left ear: Secondary | ICD-10-CM | POA: Insufficient documentation

## 2018-08-01 DIAGNOSIS — J342 Deviated nasal septum: Secondary | ICD-10-CM | POA: Insufficient documentation

## 2018-08-01 DIAGNOSIS — R0981 Nasal congestion: Secondary | ICD-10-CM | POA: Diagnosis not present

## 2018-08-01 DIAGNOSIS — F17200 Nicotine dependence, unspecified, uncomplicated: Secondary | ICD-10-CM | POA: Diagnosis not present

## 2018-08-01 DIAGNOSIS — R42 Dizziness and giddiness: Secondary | ICD-10-CM | POA: Diagnosis not present

## 2018-08-09 ENCOUNTER — Ambulatory Visit (INDEPENDENT_AMBULATORY_CARE_PROVIDER_SITE_OTHER): Payer: Medicare Other | Admitting: Allergy & Immunology

## 2018-08-09 ENCOUNTER — Encounter: Payer: Self-pay | Admitting: Allergy & Immunology

## 2018-08-09 VITALS — BP 148/76 | HR 66 | Temp 97.7°F | Resp 14 | Ht 69.0 in | Wt 174.2 lb

## 2018-08-09 DIAGNOSIS — I6521 Occlusion and stenosis of right carotid artery: Secondary | ICD-10-CM

## 2018-08-09 DIAGNOSIS — J302 Other seasonal allergic rhinitis: Secondary | ICD-10-CM

## 2018-08-09 DIAGNOSIS — J3089 Other allergic rhinitis: Secondary | ICD-10-CM | POA: Diagnosis not present

## 2018-08-09 MED ORDER — MONTELUKAST SODIUM 10 MG PO TABS: 10.0000 mg | ORAL_TABLET | Freq: Every day | ORAL | 5 refills | Status: DC

## 2018-08-09 NOTE — Patient Instructions (Signed)
1. Chronic rhinitis - Testing today showed: weeds, grasses, indoor molds, outdoor molds and cat - Avoidance measures provided. - Stop taking: Claritin - Start taking: Zyrtec (cetirizine) 10mg  tablet once daily and Singulair (montelukast) 10mg  daily - You can use an extra dose of the antihistamine, if needed, for breakthrough symptoms.  - Start nasal saline rinses 1-2 times daily to remove allergens from the nasal cavities as well as help with mucous clearance (this is especially helpful to do before the nasal sprays are given) - Consider allergy shots as a means of long-term control. - Allergy shots "re-train" and "reset" the immune system to ignore environmental allergens and decrease the resulting immune response to those allergens (sneezing, itchy watery eyes, runny nose, nasal congestion, etc).    - Allergy shots improve symptoms in 75-85% of patients.  - We can discuss more at the next appointment if the medications are not working for you.   2. Return in about 2 months (around 10/09/2018).   Please inform us of any Emergency Department visits, hospitalizations, or changes in symptoms. Call us before going to the ED for breathing or allergy symptoms since we might be able to fit you in for a sick visit. Feel free to contact us anytime with any questions, problems, or concerns.  It was a pleasure to meet you today!  Websites that have reliable patient information: 1. American Academy of Asthma, Allergy, and Immunology: www.aaaai.org 2. Food Allergy Research and Education (FARE): foodallergy.org 3. Mothers of Asthmatics: http://www.asthmacommunitynetwork.org 4. American College of Allergy, Asthma, and Immunology: MonthlyElectricBill.co.uk   Make sure you are registered to vote! If you have moved or changed any of your contact information, you will need to get this updated before voting!    You can buy saline nose drops at a pharmacy, or you can make your own saline solution:  1. Add 1 cup (240  mL) distilled water to a clean container. If you use tap water, boil it first to sterilize it, and then let it cool until it is lukewarm.  2. Add 0.5 tsp (2.5 g) salt to the water. 3. Add 0.5 tsp (2.5 g) baking soda.    Reducing Pollen Exposure  The American Academy of Allergy, Asthma and Immunology suggests the following steps to reduce your exposure to pollen during allergy seasons.    1. Do not hang sheets or clothing out to dry; pollen may collect on these items. 2. Do not mow lawns or spend time around freshly cut grass; mowing stirs up pollen. 3. Keep windows closed at night.  Keep car windows closed while driving. 4. Minimize morning activities outdoors, a time when pollen counts are usually at their highest. 5. Stay indoors as much as possible when pollen counts or humidity is high and on windy days when pollen tends to remain in the air longer. 6. Use air conditioning when possible.  Many air conditioners have filters that trap the pollen spores. 7. Use a HEPA room air filter to remove pollen form the indoor air you breathe.  Control of Mold Allergen   Mold and fungi can grow on a variety of surfaces provided certain temperature and moisture conditions exist.  Outdoor molds grow on plants, decaying vegetation and soil.  The major outdoor mold, Alternaria and Cladosporium, are found in very high numbers during hot and dry conditions.  Generally, a late Summer - Fall peak is seen for common outdoor fungal spores.  Rain will temporarily lower outdoor mold spore count, but counts rise  rapidly when the rainy period ends.  The most important indoor molds are Aspergillus and Penicillium.  Dark, humid and poorly ventilated basements are ideal sites for mold growth.  The next most common sites of mold growth are the bathroom and the kitchen.  Outdoor (Seasonal) Mold Control  Positive outdoor molds via skin testing: Alternaria, Cladosporium, Bipolaris (Helminthsporium), Drechslera (Curvalaria)  and Mucor  1. Use air conditioning and keep windows closed 2. Avoid exposure to decaying vegetation. 3. Avoid leaf raking. 4. Avoid grain handling. 5. Consider wearing a face mask if working in moldy areas.  6.   Indoor (Perennial) Mold Control   Positive indoor molds via skin testing: Fusarium, Aureobasidium ([place]a) and Rhizopus  1. Maintain humidity below 50%. 2. Clean washable surfaces with 5% bleach solution. 3. Remove sources e.g. contaminated carpets.     Control of Dog or Cat Allergen  Avoidance is the best way to manage a dog or cat allergy. If you have a dog or cat and are allergic to dog or cats, consider removing the dog or cat from the home. If you have a dog or cat but don't want to find it a new home, or if your family wants a pet even though someone in the household is allergic, here are some strategies that may help keep symptoms at bay:  1. Keep the pet out of your bedroom and restrict it to only a few rooms. Be advised that keeping the dog or cat in only one room will not limit the allergens to that room. 2. Don't pet, hug or kiss the dog or cat; if you do, wash your hands with soap and water. 3. High-efficiency particulate air (HEPA) cleaners run continuously in a bedroom or living room can reduce allergen levels over time. 4. Regular use of a high-efficiency vacuum cleaner or a central vacuum can reduce allergen levels. 5. Giving your dog or cat a bath at least once a week can reduce airborne allergen.  Allergy Shots   Allergies are the result of a chain reaction that starts in the immune system. Your immune system controls how your body defends itself. For instance, if you have an allergy to pollen, your immune system identifies pollen as an invader or allergen. Your immune system overreacts by producing antibodies called Immunoglobulin E (IgE). These antibodies travel to cells that release chemicals, causing an allergic reaction.  The concept behind  allergy immunotherapy, whether it is received in the form of shots or tablets, is that the immune system can be desensitized to specific allergens that trigger allergy symptoms. Although it requires time and patience, the payback can be long-term relief.  How Do Allergy Shots Work?  Allergy shots work much like a vaccine. Your body responds to injected amounts of a particular allergen given in increasing doses, eventually developing a resistance and tolerance to it. Allergy shots can lead to decreased, minimal or no allergy symptoms.  There generally are two phases: build-up and maintenance. Build-up often ranges from three to six months and involves receiving injections with increasing amounts of the allergens. The shots are typically given once or twice a week, though more rapid build-up schedules are sometimes used.  The maintenance phase begins when the most effective dose is reached. This dose is different for each person, depending on how allergic you are and your response to the build-up injections. Once the maintenance dose is reached, there are longer periods between injections, typically two to four weeks.  Occasionally doctors give cortisone-type shots  that can temporarily reduce allergy symptoms. These types of shots are different and should not be confused with allergy immunotherapy shots.  Who Can Be Treated with Allergy Shots?  Allergy shots may be a good treatment approach for people with allergic rhinitis (hay fever), allergic asthma, conjunctivitis (eye allergy) or stinging insect allergy.   Before deciding to begin allergy shots, you should consider:  . The length of allergy season and the severity of your symptoms . Whether medications and/or changes to your environment can control your symptoms . Your desire to avoid long-term medication use . Time: allergy immunotherapy requires a major time commitment . Cost: may vary depending on your insurance coverage  Allergy shots  for children age 77 and older are effective and often well tolerated. They might prevent the onset of new allergen sensitivities or the progression to asthma.  Allergy shots are not started on patients who are pregnant but can be continued on patients who become pregnant while receiving them. In some patients with other medical conditions or who take certain common medications, allergy shots may be of risk. It is important to mention other medications you talk to your allergist.   When Will I Feel Better?  Some may experience decreased allergy symptoms during the build-up phase. For others, it may take as long as 12 months on the maintenance dose. If there is no improvement after a year of maintenance, your allergist will discuss other treatment options with you.  If you aren't responding to allergy shots, it may be because there is not enough dose of the allergen in your vaccine or there are missing allergens that were not identified during your allergy testing. Other reasons could be that there are high levels of the allergen in your environment or major exposure to non-allergic triggers like tobacco smoke.  What Is the Length of Treatment?  Once the maintenance dose is reached, allergy shots are generally continued for three to five years. The decision to stop should be discussed with your allergist at that time. Some people may experience a permanent reduction of allergy symptoms. Others may relapse and a longer course of allergy shots can be considered.  What Are the Possible Reactions?  The two types of adverse reactions that can occur with allergy shots are local and systemic. Common local reactions include very mild redness and swelling at the injection site, which can happen immediately or several hours after. A systemic reaction, which is less common, affects the entire body or a particular body system. They are usually mild and typically respond quickly to medications. Signs include  increased allergy symptoms such as sneezing, a stuffy nose or hives.  Rarely, a serious systemic reaction called anaphylaxis can develop. Symptoms include swelling in the throat, wheezing, a feeling of tightness in the chest, nausea or dizziness. Most serious systemic reactions develop within 30 minutes of allergy shots. This is why it is strongly recommended you wait in your doctor's office for 30 minutes after your injections. Your allergist is trained to watch for reactions, and his or her staff is trained and equipped with the proper medications to identify and treat them.  Who Should Administer Allergy Shots?  The preferred location for receiving shots is your prescribing allergist's office. Injections can sometimes be given at another facility where the physician and staff are trained to recognize and treat reactions, and have received instructions by your prescribing allergist.

## 2018-08-09 NOTE — Progress Notes (Signed)
NEW PATIENT  Date of Service/Encounter:  08/09/18  Referring provider: Alycia Rossetti, MD   Assessment:   Seasonal and perennial allergic rhinitis (weeds, grasses, indoor molds, outdoor molds and cat)  Plan/Recommendations:   1. Chronic rhinitis - Testing today showed: weeds, grasses, indoor molds, outdoor molds and cat - Avoidance measures provided. - Stop taking: Claritin  - Start taking: Zyrtec (cetirizine) 10mg  tablet once daily and Singulair (montelukast) 10mg  daily - You can use an extra dose of the antihistamine, if needed, for breakthrough symptoms.  - Start nasal saline rinses 1-2 times daily to remove allergens from the nasal cavities as well as help with mucous clearance (this is especially helpful to do before the nasal sprays are given) - Consider allergy shots as a means of long-term control. - Allergy shots "re-train" and "reset" the immune system to ignore environmental allergens and decrease the resulting immune response to those allergens (sneezing, itchy watery eyes, runny nose, nasal congestion, etc).    - Allergy shots improve symptoms in 75-85% of patients.  - We can discuss more at the next appointment if the medications are not working for you.  - He is very reluctant to start any nasal sprays, therefore shots might be the best treatment option for him.  2. Return in about 2 months (around 10/09/2018).    Subjective:   Barry [name] is a 84 y.o. male presenting today for evaluation of  Chief Complaint  Patient presents with  . Nasal Congestion  . Ear Pain    Barry [name] has a history of the following: Patient Active Problem List   Diagnosis Date Noted  . Seasonal and perennial allergic rhinitis 08/09/2018  . Carotid artery disease (Barry [name]) 11/18/2017  . Hx of colonic polyps 08/26/2015  . Erectile dysfunction 02/17/2015  . AAA (abdominal aortic aneurysm) without rupture (Barry [name]) 05/21/2014  . Chest pain 01/31/2014  . CAD (coronary  artery disease) 12/24/2013  . Routine health maintenance 07/13/2011  . HEARING LOSS, CONDUCTIVE, LEFT 02/23/2010  . DIZZINESS, CHRONIC 05/28/2008  . Abdominal aortic aneurysm (Barry [name]) 12/13/2007  . PULMONARY NODULE 09/07/2007  . Diabetes mellitus type II, controlled (Barry [name]) 09/06/2007  . Hyperlipidemia 09/06/2007  . ANXIETY DEPRESSION 09/06/2007  . TOBACCO ABUSE 09/06/2007  . Essential hypertension 09/06/2007  . ALLERGIC RHINITIS 09/06/2007  . GERD 09/06/2007  . BASAL CELL CARCINOMA, NOSE 09/06/2007    History obtained from: chart review and patient.  Barry [name] was referred by Barry Rossetti, MD.     Barry [name] is a 84 y.o. male presenting for an evaluation of chronic rhinitis. He was seen by ENT in [name] for vertigo but it was felt that he had allergies as well.  He was on Claritin and this caused some jitteriness. He has had allergic rhinitis symptoms for years. He does mow a lot and does not wear masks. This years has been the worst for him. It has been dry and dusty which has seemed to have made it bad. Typically he will only have some vertigo and sinus pain, but this year has been worse. He has not problems with animals at all. He feels that Flonase is "rough" on his nose. He does have a script for cetirizine but he has not tried it yet. He does get treated for sinusitis in the late summers typically.   He was on allergy shots for years. He and his deceased wife gave them to each other apparently. He does not even remember the name of the  allergist who saw him initially. He did have tubes in his ears around age 67. He apparently has a spur in his nose which is still present; they are not going to try anything for it.   He does report sneezing with exposure to pepper on eggs. He tolerates eggs by themselves. He does eat seafood, peanuts, wheat, and all of the other food allergens.   He is a life long smoker. He does not have a diagnosis of COPD and has never had an inhaler. He  has never needed steroids.   Otherwise, there is no history of other atopic diseases, including asthma, food allergies, drug allergies, stinging insect allergies, eczema or urticaria. There is no significant infectious history. Vaccinations are up to date.    Past Medical History: Patient Active Problem List   Diagnosis Date Noted  . Seasonal and perennial allergic rhinitis 08/09/2018  . Carotid artery disease (Barry [name]) 11/18/2017  . Hx of colonic polyps 08/26/2015  . Erectile dysfunction 02/17/2015  . AAA (abdominal aortic aneurysm) without rupture ([place]) 05/21/2014  . Chest pain 01/31/2014  . CAD (coronary artery disease) 12/24/2013  . Routine health maintenance 07/13/2011  . HEARING LOSS, CONDUCTIVE, LEFT 02/23/2010  . DIZZINESS, CHRONIC 05/28/2008  . Abdominal aortic aneurysm (Barry [name]) 12/13/2007  . PULMONARY NODULE 09/07/2007  . Diabetes mellitus type II, controlled (Barry [name]) 09/06/2007  . Hyperlipidemia 09/06/2007  . ANXIETY DEPRESSION 09/06/2007  . TOBACCO ABUSE 09/06/2007  . Essential hypertension 09/06/2007  . ALLERGIC RHINITIS 09/06/2007  . GERD 09/06/2007  . BASAL CELL CARCINOMA, NOSE 09/06/2007    Medication List:  Allergies as of 08/09/2018      Reactions   Lisinopril-hydrochlorothiazide Swelling   ZESTRIL   Doxycycline Rash      Medication List        Accurate as of 08/09/18  4:01 PM. Always use your most recent med list.          aspirin EC 81 MG tablet Take 81 mg by mouth every morning.   ezetimibe 10 MG tablet Commonly known as:  ZETIA TAKE 1 TABLET BY MOUTH DAILY   FLUoxetine 20 MG capsule Commonly known as:  PROZAC Take 1 capsule (20 mg total) by mouth every morning.   glimepiride 1 MG tablet Commonly known as:  AMARYL TAKE 1 TABLET BY MOUTH TWICE DAILY   hydrochlorothiazide 25 MG tablet Commonly known as:  HYDRODIURIL TAKE 1 TABLET BY MOUTH DAILY   LORazepam 1 MG tablet Commonly known as:  ATIVAN Take 1 tablet (1 mg total) by mouth 2 (two)  times daily.   metoprolol succinate 25 MG 24 hr tablet Commonly known as:  TOPROL-XL Take 0.5 tablets (12.5 mg total) by mouth daily.   NIFEdipine 30 MG 24 hr tablet Commonly known as:  PROCARDIA-XL/NIFEDICAL-XL TAKE 1 TABLET BY MOUTH DAILY   ramipril 10 MG capsule Commonly known as:  ALTACE TAKE ONE CAPSULE BY MOUTH DAILY   simvastatin 40 MG tablet Commonly known as:  ZOCOR Take 1 tablet (40 mg total) by mouth at bedtime.       Birth History: non-contributory  Developmental History: non-contributory.   Past Surgical History: Past Surgical History:  Procedure Laterality Date  . COLONOSCOPY  2006, 2008, 2010, 05/27/2011   numerous adenomas - 13 in 2006, 3, 2008, 2 2012 (up to 1 cm), 4 diminutive adenomas, serrated adenomas 2012. Diverticulosis and hemorrhoids also.  Marland Kitchen EYE SURGERY    . EYE SURGERY Left   . Kidney stone operation    . lower  limb amputation, other toe 5th    . percutaneous stent graft repair of infrarenal AAA  11/10   5.6 cm (T. early)  . surgical excision basal cell carcinoma    . tympanic eardrum repair    . VASECTOMY       Family History: Family History  Problem Relation Age of Onset  . Emphysema Father   . Cancer Brother        Lung  . Depression Brother   . Depression Sister   . Lung disease Sister        Fibrosis and died from pneumonia on 04/11/13  . Kidney disease Sister   . Diabetes Sister   . Learning disabilities Sister   . Mental retardation Sister   . Heart disease Mother   . Cancer Brother   . Colon cancer Neg Hx   . Dementia Neg Hx   . Alcohol abuse Neg Hx   . Drug abuse Neg Hx   . Paranoid behavior Neg Hx   . Schizophrenia Neg Hx   . Anxiety disorder Neg Hx   . Bipolar disorder Neg Hx   . OCD Neg Hx   . Sexual abuse Neg Hx   . Physical abuse Neg Hx   . Seizures Neg Hx   . Esophageal cancer Neg Hx   . Stomach cancer Neg Hx   . Rectal cancer Neg Hx      Social History: Barry [name] lives at home with by himself. He is  widowed since January 31, 1997 when his wife died abruptly from an MI. They were married for 32 years. He is a currently smoker, currently down to 10 cigarettes per day.  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  He has been retired for 20+ years.  He lives in a mobile home for the past 40 years.  There is carpeting throughout the home.  He has kerosene heating and window units for cooling.  There are no animals inside or outside of the home.  He does not have dust mite covers on his bedding.    Review of Systems: a 14-point review of systems is pertinent for what is mentioned in HPI.  Otherwise, all other systems were negative. Constitutional: negative other than that listed in the HPI Eyes: negative other than that listed in the HPI Ears, nose, mouth, throat, and face: negative other than that listed in the HPI Respiratory: negative other than that listed in the HPI Cardiovascular: negative other than that listed in the HPI Gastrointestinal: negative other than that listed in the HPI Genitourinary: negative other than that listed in the HPI Integument: negative other than that listed in the HPI Hematologic: negative other than that listed in the HPI Musculoskeletal: negative other than that listed in the HPI Neurological: negative other than that listed in the HPI Allergy/Immunologic: negative other than that listed in the HPI    Objective:   Blood pressure (!) 148/76, pulse 66, temperature 97.7 F (36.5 C), temperature source Oral, resp. rate 14, height 5\' 9"  (1.753 m), weight 174 lb 3.2 oz (79 kg). Body mass index is 25.72 kg/m.   Physical Exam:  General: Alert, interactive, in no acute distress. Pleasant. Eyes: No conjunctival injection bilaterally, no discharge on the right, no discharge on the left and no Horner-Trantas dots present. PERRL bilaterally. EOMI without pain. No photophobia.  Ears: Right TM pearly gray with normal light reflex, Left TM pearly gray with normal light  reflex, Right TM intact without perforation  and Left TM intact without perforation.  Nose/Throat: External nose within normal limits and septum midline. Turbinates edematous and pale with clear discharge. Posterior oropharynx erythematous without cobblestoning in the posterior oropharynx. Tonsils 2+ without exudates.  Tongue without thrush. Neck: Supple without thyromegaly. Trachea midline. Adenopathy: no enlarged lymph nodes appreciated in the anterior cervical, occipital, axillary, epitrochlear, inguinal, or popliteal regions. Lungs: Clear to auscultation without wheezing, rhonchi or rales. No increased work of breathing. CV: Normal S1/S2. No murmurs. Capillary refill <2 seconds.  Abdomen: Nondistended, nontender. No guarding or rebound tenderness. Bowel sounds present in all fields and hyperactive  Skin: Warm and dry, without lesions or rashes. Extremities:  No clubbing, cyanosis or edema. Neuro:   Grossly intact. No focal deficits appreciated. Responsive to questions.  Diagnostic studies:    Allergy Studies:   Airborne Adult Perc - 2018/08/21 1402    Time Antigen Placed  1402    Allergen Manufacturer  Lavella Hammock    Location  Back    Number of Test  59    Panel 1  Select    1. Control-Buffer 50% Glycerol  Negative    2. Control-Histamine 1 mg/ml  Negative    3. Albumin saline  Negative    4. Auburn  Negative    5. Guatemala  Negative    6. Johnson  Negative    7. Erwin Blue  Negative    8. Meadow Fescue  Negative    9. Perennial Rye  Negative    10. Sweet Vernal  Negative    11. Timothy  Negative    12. Cocklebur  Negative    13. Burweed Marshelder  Negative    14. Ragweed, short  Negative    15. Ragweed, Giant  Negative    16. Plantain,  English  Negative    17. Lamb's Quarters  Negative    18. Sheep Sorrell  Negative    19. Rough Pigweed  Negative    20. Marsh Elder, Rough  Negative    21. Mugwort, Common  Negative    22. Ash mix  Negative    23. Birch mix  Negative    24.  Beech American  Negative    25. Box, Elder  Negative    26. Cedar, red  Negative    27. Cottonwood, Russian Federation  Negative    28. Elm mix  Negative    29. Hickory mix  Negative    30. Maple mix  Negative    31. Oak, [place] mix  Negative    32. Pecan Pollen  Negative    33. Pine mix  Negative    34. Sycamore Eastern  Negative    35. Fouke, Black Pollen  Negative    36. Alternaria alternata  Negative    37. Cladosporium Herbarum  Negative    38. Aspergillus mix  Negative    39. Penicillium mix  Negative    40. Bipolaris sorokiniana (Helminthosporium)  Negative    41. Drechslera spicifera (Curvularia)  Negative    42. Mucor plumbeus  Negative    43. Fusarium moniliforme  Negative    44. Aureobasidium pullulans (pullulara)  Negative    45. Rhizopus oryzae  Negative    46. Botrytis cinera  Negative    47. Epicoccum nigrum  Negative    48. Phoma betae  Negative    49. Candida Albicans  Negative    50. Trichophyton mentagrophytes  Negative    51. Mite, D Farinae  5,000 AU/ml  Negative  52. Mite, D Pteronyssinus  5,000 AU/ml  Negative    53. Cat Hair 10,000 BAU/ml  Negative    54.  Dog Epithelia  Negative    55. Mixed Feathers  Negative    56. Horse Epithelia  Negative    57. Cockroach, German  Negative    58. Mouse  Negative    59. Tobacco Leaf  Negative     Intradermal - 08/09/18 1436    Time Antigen Placed  1436    Allergen Manufacturer  Lavella Hammock    Location  Arm    Number of Test  15    Intradermal  Select    Control  Negative    Guatemala  Negative    Johnson  1+    7 Grass  1+    Ragweed mix  Negative    Weed mix  2+    Tree mix  Negative    Mold 1  2+    Mold 2  Negative    Mold 3  2+    Mold 4  2+    Cat  1+    Dog  Negative    Cockroach  Negative    Mite mix  Negative        Allergy testing results were read and interpreted by myself, documented by clinical staff.       Salvatore Marvel, MD [facility]

## 2018-08-17 ENCOUNTER — Other Ambulatory Visit: Payer: Self-pay | Admitting: Family Medicine

## 2018-08-24 DIAGNOSIS — H90A32 Mixed conductive and sensorineural hearing loss, unilateral, left ear with restricted hearing on the contralateral side: Secondary | ICD-10-CM | POA: Diagnosis not present

## 2018-08-24 DIAGNOSIS — H90A21 Sensorineural hearing loss, unilateral, right ear, with restricted hearing on the contralateral side: Secondary | ICD-10-CM | POA: Diagnosis not present

## 2018-08-24 DIAGNOSIS — R42 Dizziness and giddiness: Secondary | ICD-10-CM | POA: Diagnosis not present

## 2018-08-30 DIAGNOSIS — R2689 Other abnormalities of gait and mobility: Secondary | ICD-10-CM | POA: Diagnosis not present

## 2018-09-05 ENCOUNTER — Ambulatory Visit (INDEPENDENT_AMBULATORY_CARE_PROVIDER_SITE_OTHER): Payer: Medicare Other | Admitting: *Deleted

## 2018-09-05 DIAGNOSIS — Z23 Encounter for immunization: Secondary | ICD-10-CM

## 2018-09-13 ENCOUNTER — Other Ambulatory Visit: Payer: Self-pay | Admitting: Family Medicine

## 2018-09-18 ENCOUNTER — Other Ambulatory Visit: Payer: Self-pay | Admitting: Family Medicine

## 2018-09-26 ENCOUNTER — Telehealth (HOSPITAL_COMMUNITY): Payer: Self-pay | Admitting: *Deleted

## 2018-09-26 ENCOUNTER — Other Ambulatory Visit (HOSPITAL_COMMUNITY): Payer: Self-pay | Admitting: Psychiatry

## 2018-09-26 MED ORDER — LORAZEPAM 1 MG PO TABS: 1.0000 mg | ORAL_TABLET | Freq: Three times a day (TID) | ORAL | 0 refills | Status: DC

## 2018-09-26 NOTE — Telephone Encounter (Signed)
He has enough to last until  12/23 & he says he has 2 refills But he doesn't want to get the refill because he's been taking the 3 x daily & not  2 daily

## 2018-09-26 NOTE — Telephone Encounter (Signed)
Is he out of ativan?

## 2018-09-26 NOTE — Telephone Encounter (Signed)
I sent in a prescription for Ativan 1 mg tid for 30 days only, then he needs to go back to bid

## 2018-09-26 NOTE — Telephone Encounter (Signed)
Dr Harrington Challenger Patient walked into office stating he's been sick. Last 3 months bad sinus problems & all kinds of testing done.  He's ben taking 3 tab a day because he is so nervous throughout the day. His next  Appointment is 10/25/18

## 2018-09-27 ENCOUNTER — Other Ambulatory Visit: Payer: Self-pay | Admitting: Family Medicine

## 2018-10-03 ENCOUNTER — Other Ambulatory Visit: Payer: Self-pay | Admitting: Family Medicine

## 2018-10-20 ENCOUNTER — Ambulatory Visit: Payer: Medicare Other | Admitting: Allergy & Immunology

## 2018-10-25 ENCOUNTER — Ambulatory Visit (INDEPENDENT_AMBULATORY_CARE_PROVIDER_SITE_OTHER): Payer: Medicare Other | Admitting: Psychiatry

## 2018-10-25 ENCOUNTER — Encounter (HOSPITAL_COMMUNITY): Payer: Self-pay | Admitting: Psychiatry

## 2018-10-25 ENCOUNTER — Telehealth (HOSPITAL_COMMUNITY): Payer: Self-pay | Admitting: *Deleted

## 2018-10-25 VITALS — BP 184/95 | HR 71 | Ht 69.0 in | Wt 174.0 lb

## 2018-10-25 DIAGNOSIS — F331 Major depressive disorder, recurrent, moderate: Secondary | ICD-10-CM | POA: Diagnosis not present

## 2018-10-25 MED ORDER — FLUOXETINE HCL 20 MG PO CAPS: 20.0000 mg | ORAL_CAPSULE | Freq: Every morning | ORAL | 3 refills | Status: DC

## 2018-10-25 MED ORDER — DIAZEPAM 2 MG PO TABS: 2.0000 mg | ORAL_TABLET | Freq: Three times a day (TID) | ORAL | 2 refills | Status: DC

## 2018-10-25 NOTE — Telephone Encounter (Signed)
Dr Harrington Challenger Rx clarification script sent for :   diazepam (VALIUM) 2 MG tablet 30 tablet 2 10/25/2018    Sig - Route: Take 1 tablet (2 mg total) by mouth 3 (three) times daily. - Oral

## 2018-10-25 NOTE — Telephone Encounter (Signed)
It should be #90

## 2018-10-25 NOTE — Progress Notes (Signed)
BH MD/PA/NP OP Progress Note  10/25/2018 1:37 PM Barry [name]  MRN:  [id number]  Chief Complaint:  Chief Complaint    Anxiety; Follow-up     HPI: This patient is an 85year-old widowed white male who lives alone in [place]. He has 2 children and 3 grandchildren who live in [place]. 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  The patient states that he developed anxiety and depression in his 50s. His depression worsened when his wife died suddenly 71 years ago. However he is doing very well on his current medications. He denies being depressed or anxious and he is sleeping quite well. He spends most of his time with her grandchildren and really enjoys it. His social life is quite active. He has no side effects or complaints regarding his medications  The patient returns after 6 months.  He is currently not doing that well.  He has had a lot of spells of dizziness notes and lightheadedness.  This seemed to start with a sinus infection in 3 months ago but is just gotten worse.  He has had a lot of evaluations through ENT but his sinuses are now clear and nothing else is showing up.  He does not seem to have postural hypotension.  He states that he is also very anxious and thinks that the Ativan is no longer helping.  His blood pressure is somewhat high today and he is worried about this as well.  He is sleeping and eating well and spending time with his extended family.  I suggested we go back to Valium and see if this works any better for him if not we will try a different anxiety medication Visit Diagnosis:    ICD-10-CM   1. Major depressive disorder, recurrent episode, moderate (HCC) F33.1     Past Psychiatric History: Long-term outpatient treatment for depression and anxiety  Past Medical History:  Past Medical History:  Diagnosis Date  . AAA (abdominal aortic aneurysm) (HCC) 3-09   4.7 cm  . Allergy    rhinitis  . Anxiety   . Cancer ([place])     skin  . Cataract   . Depression   . Diabetes mellitus   . Diverticulosis   . GERD (gastroesophageal reflux disease)   . Hepatitis   . Hx of colonic polyps 08/26/2015  . Hyperlipidemia   . Hypertension   . Nephrolithiasis   . Personal history of colonic polyps    adenomas, serrated also  . PVD (peripheral vascular disease) ([place])   . Tobacco abuse     Past Surgical History:  Procedure Laterality Date  . COLONOSCOPY  2006, 2008, 2010, 05/27/2011   numerous adenomas - 13 in 2006, 3, 2008, 2 2012 (up to 1 cm), 4 diminutive adenomas, serrated adenomas 2012. Diverticulosis and hemorrhoids also.  Marland Kitchen EYE SURGERY    . EYE SURGERY Left   . Kidney stone operation    . lower limb amputation, other toe 5th    . percutaneous stent graft repair of infrarenal AAA  11/10   5.6 cm (T. early)  . surgical excision basal cell carcinoma    . tympanic eardrum repair    . VASECTOMY      Family Psychiatric History: see below  Family History:  Family History  Problem Relation Age of Onset  . Emphysema Father   . Cancer Brother        Lung  . Depression Brother   . Depression Sister   .  Lung disease Sister        Fibrosis and died from pneumonia on 04-06-13  . Kidney disease Sister   . Diabetes Sister   . Learning disabilities Sister   . Mental retardation Sister   . Heart disease Mother   . Cancer Brother   . Colon cancer Neg Hx   . Dementia Neg Hx   . Alcohol abuse Neg Hx   . Drug abuse Neg Hx   . Paranoid behavior Neg Hx   . Schizophrenia Neg Hx   . Anxiety disorder Neg Hx   . Bipolar disorder Neg Hx   . OCD Neg Hx   . Sexual abuse Neg Hx   . Physical abuse Neg Hx   . Seizures Neg Hx   . Esophageal cancer Neg Hx   . Stomach cancer Neg Hx   . Rectal cancer Neg Hx     Social History:  Social History   Socioeconomic History  . Marital status: Widowed    Spouse name: Not on file  . Number of children: 2  . Years of education: Not on file  . Highest education level: Not on  file  Occupational History    Employer: RETIRED  Social Needs  . Financial resource strain: Not on file  . Food insecurity:    Worry: Not on file    Inability: Not on file  . Transportation needs:    Medical: Not on file    Non-medical: Not on file  Tobacco Use  . Smoking status: Current Every Day Smoker    Packs/day: 1.00    Years: 60.00    Pack years: 60.00    Types: Cigarettes  . Smokeless tobacco: Never Used  . Tobacco comment: smokes about 16-20 cigs a day as of 04/04/2013  Substance and Sexual Activity  . Alcohol use: No  . Drug use: No  . Sexual activity: Yes    Partners: Female    Birth control/protection: Surgical    Comment: vasectomy  Lifestyle  . Physical activity:    Days per week: Not on file    Minutes per session: Not on file  . Stress: Not on file  Relationships  . Social connections:    Talks on phone: Not on file    Gets together: Not on file    Attends religious service: Not on file    Active member of club or organization: Not on file    Attends meetings of clubs or organizations: Not on file    Relationship status: Not on file  Other Topics Concern  . 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 retired 50 years - keeping busy. Lives alone. 1- step-daughter, 1 son - '68; 3 grandchildren. does odd-jobs, yard work    Allergies:  Allergies  Allergen Reactions  . Lisinopril-Hydrochlorothiazide Swelling    ZESTRIL  . Doxycycline Rash    Metabolic Disorder Labs: Lab Results  Component Value Date   HGBA1C 6.9 (H) 05/23/2018   MPG 151 05/23/2018   MPG 151 11/17/2017   No results found for: PROLACTIN Lab Results  Component Value Date   CHOL 116 05/23/2018   TRIG 73 05/23/2018   HDL 46 05/23/2018   CHOLHDL 2.5 05/23/2018   VLDL 17 05/26/2017   LDLCALC 55 05/23/2018   LDLCALC 57 11/17/2017   Lab Results  Component Value Date   TSH 2.080 02/05/2015   TSH 1.59 06/06/2012    Therapeutic Level  Labs: No  results found for: LITHIUM No results found for: VALPROATE No components found for:  CBMZ  Current Medications: Current Outpatient Medications  Medication Sig Dispense Refill  . aspirin EC 81 MG tablet Take 81 mg by mouth every morning.    . ezetimibe (ZETIA) 10 MG tablet TAKE 1 TABLET BY MOUTH DAILY 90 tablet 2  . FLUoxetine (PROZAC) 20 MG capsule Take 1 capsule (20 mg total) by mouth every morning. 90 capsule 3  . glimepiride (AMARYL) 1 MG tablet TAKE 1 TABLET BY MOUTH TWICE DAILY 180 tablet 3  . hydrochlorothiazide (HYDRODIURIL) 25 MG tablet TAKE 1 TABLET BY MOUTH DAILY 90 tablet 0  . metoprolol succinate (TOPROL-XL) 25 MG 24 hr tablet TAKE ONE-HALF TABLET BY MOUTH DAILY. GENERIC EQUIVALENT FOR TOPROL XL 45 tablet 0  . NIFEdipine (PROCARDIA-XL/NIFEDICAL-XL) 30 MG 24 hr tablet TAKE 1 TABLET BY MOUTH DAILY 90 tablet 0  . ramipril (ALTACE) 10 MG capsule TAKE ONE CAPSULE BY MOUTH DAILY 90 capsule 0  . simvastatin (ZOCOR) 40 MG tablet TAKE 1 TABLET BY MOUTH AT BEDTIME 90 tablet 0  . diazepam (VALIUM) 2 MG tablet Take 1 tablet (2 mg total) by mouth 3 (three) times daily. 30 tablet 2  . montelukast (SINGULAIR) 10 MG tablet Take 1 tablet (10 mg total) by mouth at bedtime. 30 tablet 5   No current facility-administered medications for this visit.      Musculoskeletal: Strength & Muscle Tone: within normal limits Gait & Station: normal Patient leans: N/A  Psychiatric Specialty Exam: Review of Systems  Neurological: Positive for dizziness, weakness and headaches.  Psychiatric/Behavioral: The patient is nervous/anxious.   All other systems reviewed and are negative.   Blood pressure (!) 184/95, pulse 71, height 5\' 9"  (1.753 m), weight 174 lb (78.9 kg), SpO2 97 %.Body mass index is 25.7 kg/m.  General Appearance: Casual, Neat and Well Groomed  Eye Contact:  Good  Speech:  Clear and Coherent  Volume:  Normal  Mood:  Anxious and Irritable  Affect:  Constricted and Flat   Thought Process:  Goal Directed  Orientation:  Full (Time, Place, and Person)  Thought Content: Rumination   Suicidal Thoughts:  No  Homicidal Thoughts:  No  Memory:  Immediate;   Good Recent;   Good Remote;   Fair  Judgement:  Fair  Insight:  Fair  Psychomotor Activity:  Decreased  Concentration:  Concentration: Good and Attention Span: Good  Recall:  Good  Fund of Knowledge: Good  Language: Good  Akathisia:  No  Handed:  Right  AIMS (if indicated): not done  Assets:  Communication Skills Desire for Improvement Resilience  ADL's:  Intact  Cognition: WNL  Sleep:  Fair   Screenings: PHQ2-9     Office Visit from 07/19/2018 in [facility] Office Visit from 07/05/2018 in [facility] Office Visit from 05/24/2018 in [facility] Office Visit from 11/18/2017 in [facility] Office Visit from 01/28/2017 in [facility]  PHQ-2 Total Score  0  0  0  0  0  PHQ-9 Total Score  -  -  -  0  0       Assessment and Plan: Patient is an 85 year old male with a history of depression and anxiety.  His anxiety is worsened for unknown reasons.  He does not have any recent stressors other than the recent sinus infection.  Nevertheless the Ativan does not seem to be working so we will go back  to Valium 2 mg 3 times daily for anxiety.  He will continue Prozac 20 mg daily for depression.  He will return to see me in 3 months or call sooner if needed   [name], MD 10/25/2018, 1:37 PM

## 2018-10-27 ENCOUNTER — Emergency Department (HOSPITAL_COMMUNITY)
Admission: EM | Admit: 2018-10-27 | Discharge: 2018-10-27 | Disposition: A | Discharge status: Home / Self Care | Payer: Medicare Other | Attending: Emergency Medicine | Admitting: Emergency Medicine

## 2018-10-27 ENCOUNTER — Other Ambulatory Visit: Payer: Self-pay

## 2018-10-27 ENCOUNTER — Encounter (HOSPITAL_COMMUNITY): Payer: Self-pay | Admitting: *Deleted

## 2018-10-27 ENCOUNTER — Emergency Department (HOSPITAL_COMMUNITY): Payer: Medicare Other

## 2018-10-27 ENCOUNTER — Telehealth (HOSPITAL_COMMUNITY): Payer: Self-pay

## 2018-10-27 DIAGNOSIS — Z79899 Other long term (current) drug therapy: Secondary | ICD-10-CM | POA: Insufficient documentation

## 2018-10-27 DIAGNOSIS — Z85828 Personal history of other malignant neoplasm of skin: Secondary | ICD-10-CM | POA: Insufficient documentation

## 2018-10-27 DIAGNOSIS — F419 Anxiety disorder, unspecified: Secondary | ICD-10-CM | POA: Insufficient documentation

## 2018-10-27 DIAGNOSIS — R51 Headache: Secondary | ICD-10-CM | POA: Insufficient documentation

## 2018-10-27 DIAGNOSIS — Z7982 Long term (current) use of aspirin: Secondary | ICD-10-CM | POA: Diagnosis not present

## 2018-10-27 DIAGNOSIS — I2510 Atherosclerotic heart disease of native coronary artery without angina pectoris: Secondary | ICD-10-CM | POA: Diagnosis not present

## 2018-10-27 DIAGNOSIS — F17210 Nicotine dependence, cigarettes, uncomplicated: Secondary | ICD-10-CM | POA: Insufficient documentation

## 2018-10-27 DIAGNOSIS — I10 Essential (primary) hypertension: Secondary | ICD-10-CM | POA: Insufficient documentation

## 2018-10-27 DIAGNOSIS — R42 Dizziness and giddiness: Secondary | ICD-10-CM | POA: Diagnosis not present

## 2018-10-27 DIAGNOSIS — E119 Type 2 diabetes mellitus without complications: Secondary | ICD-10-CM | POA: Insufficient documentation

## 2018-10-27 DIAGNOSIS — R519 Headache, unspecified: Secondary | ICD-10-CM

## 2018-10-27 LAB — CBC WITH DIFFERENTIAL/PLATELET
Abs Immature Granulocytes: 0.05 10*3/uL (ref 0.00–0.07)
BASOS ABS: 0 10*3/uL (ref 0.0–0.1)
Basophils Relative: 0 %
EOS ABS: 0.1 10*3/uL (ref 0.0–0.5)
EOS PCT: 1 %
HCT: 52.1 % — ABNORMAL HIGH (ref 39.0–52.0)
Hemoglobin: 17.4 g/dL — ABNORMAL HIGH (ref 13.0–17.0)
IMMATURE GRANULOCYTES: 1 %
Lymphocytes Relative: 28 %
Lymphs Abs: 2.9 10*3/uL (ref 0.7–4.0)
MCH: 31.2 pg (ref 26.0–34.0)
MCHC: 33.4 g/dL (ref 30.0–36.0)
MCV: 93.4 fL (ref 80.0–100.0)
Monocytes Absolute: 1.2 10*3/uL — ABNORMAL HIGH (ref 0.1–1.0)
Monocytes Relative: 12 %
NRBC: 0 % (ref 0.0–0.2)
Neutro Abs: 5.9 10*3/uL (ref 1.7–7.7)
Neutrophils Relative %: 58 %
Platelets: 258 10*3/uL (ref 150–400)
RBC: 5.58 MIL/uL (ref 4.22–5.81)
RDW: 13.4 % (ref 11.5–15.5)
WBC: 10.2 10*3/uL (ref 4.0–10.5)

## 2018-10-27 LAB — URINALYSIS, ROUTINE W REFLEX MICROSCOPIC
Bacteria, UA: NONE SEEN
Bilirubin Urine: NEGATIVE
Glucose, UA: NEGATIVE mg/dL
KETONES UR: 20 mg/dL — AB
LEUKOCYTES UA: NEGATIVE
NITRITE: NEGATIVE
Protein, ur: 100 mg/dL — AB
Specific Gravity, Urine: 1.013 (ref 1.005–1.030)
pH: 7 (ref 5.0–8.0)

## 2018-10-27 LAB — BASIC METABOLIC PANEL
ANION GAP: 12 (ref 5–15)
BUN: 17 mg/dL (ref 8–23)
CO2: 23 mmol/L (ref 22–32)
Calcium: 9.3 mg/dL (ref 8.9–10.3)
Chloride: 99 mmol/L (ref 98–111)
Creatinine, Ser: 0.81 mg/dL (ref 0.61–1.24)
GFR calc non Af Amer: >60 mL/min (ref >60)
Glucose, Bld: 158 mg/dL — ABNORMAL HIGH (ref 70–99)
Potassium: 3.3 mmol/L — ABNORMAL LOW (ref 3.5–5.1)
Sodium: 134 mmol/L — ABNORMAL LOW (ref 135–145)

## 2018-10-27 LAB — CBG MONITORING, ED: Glucose-Capillary: 171 mg/dL — ABNORMAL HIGH (ref 70–99)

## 2018-10-27 LAB — TSH: TSH: 2.071 u[IU]/mL (ref 0.350–4.500)

## 2018-10-27 MED ORDER — MECLIZINE HCL 12.5 MG PO TABS
12.5000 mg | ORAL_TABLET | Freq: Once | ORAL | Status: AC
  Administered 2018-10-27: 12.5 mg via ORAL
  Filled 2018-10-27: qty 1

## 2018-10-27 MED ORDER — MECLIZINE HCL 12.5 MG PO TABS: 12.5000 mg | ORAL_TABLET | Freq: Three times a day (TID) | ORAL | 0 refills | Status: DC | PRN

## 2018-10-27 MED ORDER — LORAZEPAM 1 MG PO TABS
1.0000 mg | ORAL_TABLET | Freq: Once | ORAL | Status: AC
  Administered 2018-10-27: 1 mg via ORAL
  Filled 2018-10-27: qty 1

## 2018-10-27 MED ORDER — SODIUM CHLORIDE 0.9 % IV BOLUS
500.0000 mL | Freq: Once | INTRAVENOUS | Status: AC
  Administered 2018-10-27: 500 mL via INTRAVENOUS

## 2018-10-27 MED ORDER — ACETAMINOPHEN 325 MG PO TABS
650.0000 mg | ORAL_TABLET | ORAL | Status: DC | PRN
  Administered 2018-10-27: 650 mg via ORAL
  Filled 2018-10-27: qty 2

## 2018-10-27 MED ORDER — LABETALOL HCL 5 MG/ML IV SOLN
10.0000 mg | Freq: Once | INTRAVENOUS | Status: DC
  Filled 2018-10-27: qty 4

## 2018-10-27 MED ORDER — LORAZEPAM 1 MG PO TABS: 1.0000 mg | ORAL_TABLET | Freq: Three times a day (TID) | ORAL | 0 refills | Status: DC | PRN

## 2018-10-27 NOTE — ED Provider Notes (Signed)
[facility] EMERGENCY DEPARTMENT Provider Note   CSN: [id number] Arrival date & time: 10/27/18  1443     History   Chief Complaint Chief Complaint  Patient presents with  . Headache    HPI Barry [name] is a 85 y.o. male.  HPI Patient presents with frontal headache that started 2 days ago.  States the headache is constant.  No photophobia, nausea or vomiting.  No focal weakness or numbness.  States the headache is associated with anxiety.  Has had increased anxiety since his psychiatrist changed his medication.  Patient had been taking Ativan 1 mg 3 times daily.  This was changed to Valium 2 mg 3 times daily.  Patient blood pressure is also been increased.  States he has been compliant with his blood pressure medication.  Believes this is also associated with anxiety. Past Medical History:  Diagnosis Date  . AAA (abdominal aortic aneurysm) (HCC) 3-09   4.7 cm  . Allergy    rhinitis  . Anxiety   . Cancer ([place])    skin  . Cataract   . Depression   . Diabetes mellitus   . Diverticulosis   . GERD (gastroesophageal reflux disease)   . Hepatitis   . Hx of colonic polyps 08/26/2015  . Hyperlipidemia   . Hypertension   . Nephrolithiasis   . Personal history of colonic polyps    adenomas, serrated also  . PVD (peripheral vascular disease) ([place])   . Tobacco abuse     Patient Active Problem List   Diagnosis Date Noted  . Seasonal and perennial allergic rhinitis 08/09/2018  . Carotid artery disease ([place]) 11/18/2017  . Hx of colonic polyps 08/26/2015  . Erectile dysfunction 02/17/2015  . AAA (abdominal aortic aneurysm) without rupture ([place]) 05/21/2014  . Chest pain 01/31/2014  . CAD (coronary artery disease) 12/24/2013  . Routine health maintenance 07/13/2011  . HEARING LOSS, CONDUCTIVE, LEFT 02/23/2010  . DIZZINESS, CHRONIC 05/28/2008  . Abdominal aortic aneurysm ([place]) 12/13/2007  . PULMONARY NODULE 09/07/2007  . Diabetes mellitus type II, controlled ([place])  09/06/2007  . Hyperlipidemia 09/06/2007  . ANXIETY DEPRESSION 09/06/2007  . TOBACCO ABUSE 09/06/2007  . Essential hypertension 09/06/2007  . ALLERGIC RHINITIS 09/06/2007  . GERD 09/06/2007  . BASAL CELL CARCINOMA, NOSE 09/06/2007    Past Surgical History:  Procedure Laterality Date  . COLONOSCOPY  2006, 2008, 2010, 05/27/2011   numerous adenomas - 13 in 2006, 3, 2008, 2 2012 (up to 1 cm), 4 diminutive adenomas, serrated adenomas 2012. Diverticulosis and hemorrhoids also.  Marland Kitchen EYE SURGERY    . EYE SURGERY Left   . Kidney stone operation    . lower limb amputation, other toe 5th    . percutaneous stent graft repair of infrarenal AAA  11/10   5.6 cm (T. early)  . surgical excision basal cell carcinoma    . tympanic eardrum repair    . VASECTOMY          Home Medications    Prior to Admission medications   Medication Sig Start Date End Date Taking? Authorizing Provider  aspirin EC 81 MG tablet Take 81 mg by mouth every morning.   Yes [provider]  diazepam (VALIUM) 2 MG tablet Take 1 tablet (2 mg total) by mouth 3 (three) times daily. 10/25/18  Yes Cloria Spring, MD  ezetimibe (ZETIA) 10 MG tablet TAKE 1 TABLET BY MOUTH DAILY 02/21/18  Yes Lelon Perla, MD  FLUoxetine (PROZAC) 20 MG capsule Take 1  capsule (20 mg total) by mouth every morning. 10/25/18  Yes Cloria Spring, MD  glimepiride (AMARYL) 1 MG tablet TAKE 1 TABLET BY MOUTH TWICE DAILY 02/20/18  Yes Jette, Modena Nunnery, MD  hydrochlorothiazide (HYDRODIURIL) 25 MG tablet TAKE 1 TABLET BY MOUTH DAILY 08/17/18  Yes [place], Modena Nunnery, MD  ibuprofen (ADVIL,MOTRIN) 200 MG tablet Take 400 mg by mouth every 4 (four) hours as needed for mild pain.   Yes [provider]  metoprolol succinate (TOPROL-XL) 25 MG 24 hr tablet TAKE ONE-HALF TABLET BY MOUTH DAILY. GENERIC EQUIVALENT FOR TOPROL XL 09/13/18  Yes [place], Modena Nunnery, MD  NIFEdipine (PROCARDIA-XL/NIFEDICAL-XL) 30 MG 24 hr tablet TAKE 1 TABLET BY MOUTH DAILY  09/18/18  Yes [place], Modena Nunnery, MD  ramipril (ALTACE) 10 MG capsule TAKE ONE CAPSULE BY MOUTH DAILY 10/03/18  Yes [place], Modena Nunnery, MD  simvastatin (ZOCOR) 40 MG tablet TAKE 1 TABLET BY MOUTH AT BEDTIME 09/27/18  Yes [place], Modena Nunnery, MD  LORazepam (ATIVAN) 1 MG tablet Take 1 tablet (1 mg total) by mouth 3 (three) times daily as needed for anxiety. 10/27/18   Julianne Rice, MD  meclizine (ANTIVERT) 12.5 MG tablet Take 1 tablet (12.5 mg total) by mouth 3 (three) times daily as needed for dizziness. 10/27/18   Julianne Rice, MD  montelukast (SINGULAIR) 10 MG tablet Take 1 tablet (10 mg total) by mouth at bedtime. 08/09/18 09/08/18  Valentina Shaggy, MD    Family History Family History  Problem Relation Age of Onset  . Emphysema Father   . Cancer Brother        Lung  . Depression Brother   . Depression Sister   . Lung disease Sister        Fibrosis and died from pneumonia on Apr 05, 2013  . Kidney disease Sister   . Diabetes Sister   . Learning disabilities Sister   . Mental retardation Sister   . Heart disease Mother   . Cancer Brother   . Colon cancer Neg Hx   . Dementia Neg Hx   . Alcohol abuse Neg Hx   . Drug abuse Neg Hx   . Paranoid behavior Neg Hx   . Schizophrenia Neg Hx   . Anxiety disorder Neg Hx   . Bipolar disorder Neg Hx   . OCD Neg Hx   . Sexual abuse Neg Hx   . Physical abuse Neg Hx   . Seizures Neg Hx   . Esophageal cancer Neg Hx   . Stomach cancer Neg Hx   . Rectal cancer Neg Hx     Social History Social History   Tobacco Use  . Smoking status: Current Every Day Smoker    Packs/day: 1.00    Years: 60.00    Pack years: 60.00    Types: Cigarettes  . Smokeless tobacco: Never Used  . Tobacco comment: smokes about 16-20 cigs a day as of 04/04/2013  Substance Use Topics  . Alcohol use: No  . Drug use: No     Allergies   Lisinopril-hydrochlorothiazide and Doxycycline   Review of Systems Review of Systems  Constitutional: Negative for chills  and fever.  HENT: Negative for sinus pain and trouble swallowing.   Eyes: Negative for visual disturbance.  Respiratory: Negative for shortness of breath.   Cardiovascular: Negative for chest pain.  Gastrointestinal: Negative for abdominal pain, nausea and vomiting.  Musculoskeletal: Negative for back pain, myalgias and neck pain.  Skin: Negative for rash.  Neurological: Positive for headaches. Negative  for dizziness, tremors, speech difficulty, weakness, light-headedness and numbness.  Psychiatric/Behavioral: Negative for hallucinations. The patient is nervous/anxious.   All other systems reviewed and are negative.    Physical Exam Updated Vital Signs BP (!) 197/86   Pulse (!) 59   Temp 97.6 F (36.4 C) (Oral)   Resp 14   Ht 5\' 6"  (1.676 m)   Wt 78.9 kg   SpO2 97%   BMI 28.08 kg/m   Physical Exam Vitals signs and nursing note reviewed.  Constitutional:      General: He is not in acute distress.    Appearance: Normal appearance. He is well-developed. He is not ill-appearing.  HENT:     Head: Normocephalic and atraumatic.     Nose: Nose normal. No congestion.     Mouth/Throat:     Mouth: Mucous membranes are moist.     Pharynx: No oropharyngeal exudate or posterior oropharyngeal erythema.  Eyes:     Extraocular Movements: Extraocular movements intact.     Conjunctiva/sclera: Conjunctivae normal.     Pupils: Pupils are equal, round, and reactive to light.  Neck:     Musculoskeletal: Normal range of motion and neck supple. No neck rigidity or muscular tenderness.  Cardiovascular:     Rate and Rhythm: Normal rate and regular rhythm.     Heart sounds: No murmur. No friction rub. No gallop.   Pulmonary:     Effort: Pulmonary effort is normal. No respiratory distress.     Breath sounds: Normal breath sounds. No stridor. No wheezing, rhonchi or rales.  Chest:     Chest wall: No tenderness.  Abdominal:     General: Bowel sounds are normal.     Palpations: Abdomen is soft.      Tenderness: There is no abdominal tenderness. There is no guarding or rebound.  Musculoskeletal: Normal range of motion.        General: No swelling, tenderness, deformity or signs of injury.     Right lower leg: No edema.     Left lower leg: No edema.  Lymphadenopathy:     Cervical: No cervical adenopathy.  Skin:    General: Skin is warm and dry.     Capillary Refill: Capillary refill takes less than 2 seconds.     Findings: No erythema or rash.  Neurological:     General: No focal deficit present.     Mental Status: He is alert and oriented to person, place, and time.     Comments: Patient is alert and oriented x3 with clear, goal oriented speech. Patient has 5/5 motor in all extremities. Sensation is intact to light touch.   Psychiatric:     Comments: Mildly anxious appearing.  Does not appear to be responding to internal stimuli.      ED Treatments / Results  Labs (all labs ordered are listed, but only abnormal results are displayed) Labs Reviewed  CBC WITH DIFFERENTIAL/PLATELET - Abnormal; Notable for the following components:      Result Value   Hemoglobin 17.4 (*)    HCT 52.1 (*)    Monocytes Absolute 1.2 (*)    All other components within normal limits  BASIC METABOLIC PANEL - Abnormal; Notable for the following components:   Sodium 134 (*)    Potassium 3.3 (*)    Glucose, Bld 158 (*)    All other components within normal limits  URINALYSIS, ROUTINE W REFLEX MICROSCOPIC - Abnormal; Notable for the following components:   Hgb urine dipstick SMALL (*)  Ketones, ur 20 (*)    Protein, ur 100 (*)    All other components within normal limits  CBG MONITORING, ED - Abnormal; Notable for the following components:   Glucose-Capillary 171 (*)    All other components within normal limits  TSH    EKG EKG Interpretation  Date/Time:  Friday October 27 2018 06:57:20 EST Ventricular Rate:  70 PR Interval:    QRS Duration: 91 QT Interval:  405 QTC  Calculation: 437 R Axis:   34 Text Interpretation:  Sinus rhythm Consider left atrial enlargement Baseline wander Otherwise within normal limits When compared with ECG of 02/22/2014, No significant change was found Confirmed by Delora Fuel ([id number]) on 10/27/2018 7:00:13 AM   Radiology Ct Head Wo Contrast  Result Date: 10/27/2018 CLINICAL DATA:  Headaches EXAM: CT HEAD WITHOUT CONTRAST TECHNIQUE: Contiguous axial images were obtained from the base of the skull through the vertex without intravenous contrast. COMPARISON:  10/08/2007 FINDINGS: Brain: Mild atrophic and chronic white matter ischemic changes are noted commenced with the patient's given age. No findings to suggest acute hemorrhage, acute infarction or space-occupying mass lesion are noted. Vascular: No hyperdense vessel or unexpected calcification. Skull: Normal. Negative for fracture or focal lesion. Sinuses/Orbits: No acute finding. Other: None. IMPRESSION: Chronic atrophic and ischemic changes without acute abnormality. Electronically Signed   By: Inez Catalina M.D.   On: 10/27/2018 08:24    Procedures Procedures (including critical care time)  Medications Ordered in ED Medications  acetaminophen (TYLENOL) tablet 650 mg (has no administration in time range)  LORazepam (ATIVAN) tablet 1 mg (1 mg Oral Given 10/27/18 0745)  meclizine (ANTIVERT) tablet 12.5 mg (12.5 mg Oral Given 10/27/18 1046)  sodium chloride 0.9 % bolus 500 mL (500 mLs Intravenous New Bag/Given 10/27/18 1046)     Initial Impression / Assessment and Plan / ED Course  I have reviewed the triage vital signs and the nursing notes.  Pertinent labs & imaging results that were available during my care of the patient were reviewed by me and considered in my medical decision making (see chart for details).     New patient's record reveals that he has had issues with episodic headaches and balance for a number of years.  CT head without acute findings.  Possibly mild  dehydration.  Given IV fluids and dose of meclizine.  Patient is now ambulating without difficulty.  Denies dizziness.  Understands the need to follow-up closely with his primary physician.  Return precautions given.  Final Clinical Impressions(s) / ED Diagnoses   Final diagnoses:  Vertigo  Nonintractable episodic headache, unspecified headache type  Anxiety    ED Discharge Orders         Ordered    meclizine (ANTIVERT) 12.5 MG tablet  3 times daily PRN     10/27/18 1221    LORazepam (ATIVAN) 1 MG tablet  3 times daily PRN     10/27/18 1221           Julianne Rice, MD 10/27/18 1223

## 2018-10-27 NOTE — ED Notes (Signed)
Unable to complete orthostatic v/s.  Patient became very anxious and stated he just could not stand - he was too dizzy. Advised doctor.   Also, advised patient that we needed urine specimen.

## 2018-10-27 NOTE — ED Triage Notes (Signed)
Pt c/o headache for the last couple of days; pt states his PCP recently changed his anxiety medication

## 2018-10-27 NOTE — Telephone Encounter (Signed)
Prior Authorization complete for Diazepam 2mg  tablets. Will receive a decision in 3 days

## 2018-10-27 NOTE — ED Notes (Signed)
Patient was able to ambulate and stand without assistance.  Denies any dizziness.  Did request something for his headache. Notified doctor and he will order Tylenol.

## 2018-10-30 ENCOUNTER — Telehealth (HOSPITAL_COMMUNITY): Payer: Self-pay | Admitting: *Deleted

## 2018-10-30 NOTE — Telephone Encounter (Signed)
BC/BS APPROVED DIAZEPAM 2 MG TABS  AUTHORIZATION REQUESTED WILL END : 10/28/2019

## 2018-10-31 ENCOUNTER — Other Ambulatory Visit: Payer: Self-pay

## 2018-10-31 ENCOUNTER — Encounter: Payer: Self-pay | Admitting: Family Medicine

## 2018-10-31 ENCOUNTER — Ambulatory Visit (INDEPENDENT_AMBULATORY_CARE_PROVIDER_SITE_OTHER): Payer: Medicare Other | Admitting: Family Medicine

## 2018-10-31 VITALS — BP 144/82 | HR 78 | Temp 97.9°F | Resp 14 | Ht 69.0 in | Wt 173.0 lb

## 2018-10-31 DIAGNOSIS — E876 Hypokalemia: Secondary | ICD-10-CM | POA: Diagnosis not present

## 2018-10-31 DIAGNOSIS — E119 Type 2 diabetes mellitus without complications: Secondary | ICD-10-CM

## 2018-10-31 DIAGNOSIS — I10 Essential (primary) hypertension: Secondary | ICD-10-CM | POA: Diagnosis not present

## 2018-10-31 DIAGNOSIS — I714 Abdominal aortic aneurysm, without rupture, unspecified: Secondary | ICD-10-CM

## 2018-10-31 DIAGNOSIS — F341 Dysthymic disorder: Secondary | ICD-10-CM

## 2018-10-31 MED ORDER — RAMIPRIL 10 MG PO CAPS: 10.0000 mg | ORAL_CAPSULE | Freq: Every day | ORAL | 2 refills | Status: DC

## 2018-10-31 NOTE — Assessment & Plan Note (Signed)
A1c may need to decrease his glimepiride down if he is having some hypoglycemia symptoms.  We will also recheck his potassium as he had hypokalemia.

## 2018-10-31 NOTE — Progress Notes (Signed)
Subjective:    Patient ID: Barry [name], male    DOB: 01/23/1933, 85 y.o.   MRN: [id number]  Patient presents for ER F/U (vertigo) and Medication Review/ Refill (is not fasting)   Pt seen at ER for vertigovon 1/10   He has chronic anxiety and depression, he has been on ativan 1mg  twice a day he was however breaking in half and took it 4 times a day. He was then moved up to 1mg  three times a day. Then on 1/8 he was changed to Valium 2mg  three times a day he was still having a lot of anxiety and he felt the need to spread his lorazepam out more throughout the day so was taking half a tablet because he would get a headache otherwise.  His Prozac was not changed.  He states a day later he started feeling bad he was having more headaches started getting dizzy he felt like the medication was not working like his lorazepam used to. Went to the ER after he started to have a bad frontal headache and was getting getting very nervous, and his blood pressure elevaed, he was given IVF for mild dehyration and meclizine, dizziness resolved and his headache, CT of head was unremarkable   His BMET showed mild low potassium at 3.3, Na 134  CBG was  158 and  171 He was then restarted on the lorazepam from the emergency room he feels much better with this    DM- last A1C 6.9%, fasting typically around 130, feels like it gets low at times, he did have it drop to to 61 , taking amaryl 1mg  Twice a day        Review Of Systems: Per EMR  GEN- denies fatigue, fever, weight loss,weakness, recent illness HEENT- denies eye drainage, change in vision, nasal discharge, CVS- denies chest pain, palpitations RESP- denies SOB, cough, wheeze ABD- denies N/V, change in stools, abd pain GU- denies dysuria, hematuria, dribbling, incontinence MSK- denies joint pain, muscle aches, injury Neuro- denies headache, +dizziness, syncope, seizure activity       Objective:    BP (!) 144/82   Pulse 78   Temp 97.9 F  (36.6 C) (Oral)   Resp 14   Ht 5\' 9"  (1.753 m)   Wt 173 lb (78.5 kg)   SpO2 97%   BMI 25.55 kg/m  GEN- NAD, alert and oriented x3 HEENT- PERRL, EOMI, non injected sclera, pink conjunctiva, MMM, oropharynx clear Neck- Supple, no thyromegaly, +carotid bruit CVS- RRR, no murmur RESP-CTAB Neuro-CNII-XII in tact no deficits Psych- normal affect and mood  EXT- No edema Pulses- Radial 2+        Assessment & Plan:      Problem List Items Addressed This Visit      Unprioritized   AAA (abdominal aortic aneurysm) without rupture (HCC)   Relevant Medications   ramipril (ALTACE) 10 MG capsule   ANXIETY DEPRESSION - Primary    It is possible that he was having some withdrawal effects from coming off of the lorazepam which he has been on for quite some time and he was spreading it out throughout the day.  He feels better being back on this medication.  He still feels like it does not last long enough from his bedtime which is about 830 until the next morning 7 AM when he takes it.  We discussed him taking a half a tablet before he goes to bed and if he wakes up in the middle  the night anxious or having symptoms he can take the other tablet therefore he is still taking 3mg  total a day.  I will also contact his psychiatrist to let her know that he did end up in the emergency room.      Diabetes mellitus type II, controlled ([place])    A1c may need to decrease his glimepiride down if he is having some hypoglycemia symptoms.  We will also recheck his potassium as he had hypokalemia.      Relevant Medications   ramipril (ALTACE) 10 MG capsule   Other Relevant Orders   Hemoglobin O3Z   Basic metabolic panel   Essential hypertension    Blood pressure has improved feel that now that his anxiety has improved. No change in medication.      Relevant Medications   ramipril (ALTACE) 10 MG capsule   Other Relevant Orders   Basic metabolic panel    Other Visit Diagnoses    Hypokalemia           Note: This dictation was prepared with Dragon dictation along with smaller phrase technology. Any transcriptional errors that result from this process are unintentional.

## 2018-10-31 NOTE — Assessment & Plan Note (Signed)
It is possible that he was having some withdrawal effects from coming off of the lorazepam which he has been on for quite some time and he was spreading it out throughout the day.  He feels better being back on this medication.  He still feels like it does not last long enough from his bedtime which is about 830 until the next morning 7 AM when he takes it.  We discussed him taking a half a tablet before he goes to bed and if he wakes up in the middle the night anxious or having symptoms he can take the other tablet therefore he is still taking 3mg  total a day.  I will also contact his psychiatrist to let her know that he did end up in the emergency room.

## 2018-10-31 NOTE — Assessment & Plan Note (Addendum)
Blood pressure has improved feel that now that his anxiety has improved. No change in medication.

## 2018-10-31 NOTE — Patient Instructions (Addendum)
Continue current medications I will send Dr. Harrington Challenger a message  F/U as previous for wellness exam

## 2018-11-01 LAB — BASIC METABOLIC PANEL
BUN/Creatinine Ratio: 18 (calc) (ref 6–22)
BUN: 21 mg/dL (ref 7–25)
CO2: 30 mmol/L (ref 20–32)
Calcium: 9 mg/dL (ref 8.6–10.3)
Chloride: 99 mmol/L (ref 98–110)
Creat: 1.16 mg/dL — ABNORMAL HIGH (ref 0.70–1.11)
Glucose, Bld: 154 mg/dL — ABNORMAL HIGH (ref 65–99)
Potassium: 4 mmol/L (ref 3.5–5.3)
Sodium: 139 mmol/L (ref 135–146)

## 2018-11-01 LAB — HEMOGLOBIN A1C
Hgb A1c MFr Bld: 6.7 % of total Hgb — ABNORMAL HIGH (ref <5.7)
Mean Plasma Glucose: 146 (calc)
eAG (mmol/L): 8.1 (calc)

## 2018-11-04 ENCOUNTER — Other Ambulatory Visit: Payer: Self-pay | Admitting: Cardiology

## 2018-11-04 DIAGNOSIS — I2510 Atherosclerotic heart disease of native coronary artery without angina pectoris: Secondary | ICD-10-CM

## 2018-11-06 ENCOUNTER — Other Ambulatory Visit: Payer: Self-pay | Admitting: Cardiology

## 2018-11-06 ENCOUNTER — Other Ambulatory Visit: Payer: Self-pay | Admitting: Family Medicine

## 2018-11-06 DIAGNOSIS — I2510 Atherosclerotic heart disease of native coronary artery without angina pectoris: Secondary | ICD-10-CM

## 2018-11-07 ENCOUNTER — Telehealth (HOSPITAL_COMMUNITY): Payer: Self-pay | Admitting: *Deleted

## 2018-11-07 ENCOUNTER — Other Ambulatory Visit (HOSPITAL_COMMUNITY): Payer: Self-pay | Admitting: Psychiatry

## 2018-11-07 MED ORDER — CLONAZEPAM 1 MG PO TABS: 1.0000 mg | ORAL_TABLET | Freq: Three times a day (TID) | ORAL | 2 refills | Status: DC

## 2018-11-07 NOTE — Telephone Encounter (Signed)
Dr Harrington Challenger  Patient called requesting to speak with you. He's not sounding like his usuall self & he states the medication he can't take. He said he stopped taking the Valium The ativan he can only take 1/2 tab. He said he's been dizzy , light headed falling around. He stated [facility]. Butch Penny has moved his appointment to a early one 12/15/2018. He wants you to change his med's & he asked if he could talk to you.  I told him I will send this  Information  & request that you call him. And that he may or may not get a med change until the evaluation on 12/15/2018. I then ask if he would be okay , is he okay? He said I don't know?

## 2018-11-07 NOTE — Telephone Encounter (Signed)
Talked to pt--we will try clonazepam 1 mg tid

## 2018-11-09 ENCOUNTER — Other Ambulatory Visit: Payer: Self-pay | Admitting: *Deleted

## 2018-11-09 MED ORDER — GLIMEPIRIDE 1 MG PO TABS: ORAL_TABLET | ORAL | 3 refills | Status: DC

## 2018-11-15 ENCOUNTER — Ambulatory Visit (HOSPITAL_COMMUNITY): Payer: Medicare Other | Admitting: Psychiatry

## 2018-11-15 ENCOUNTER — Ambulatory Visit (INDEPENDENT_AMBULATORY_CARE_PROVIDER_SITE_OTHER): Payer: Medicare Other | Admitting: Family Medicine

## 2018-11-15 ENCOUNTER — Encounter: Payer: Self-pay | Admitting: Family Medicine

## 2018-11-15 ENCOUNTER — Other Ambulatory Visit: Payer: Self-pay

## 2018-11-15 VITALS — BP 148/72 | HR 70 | Temp 97.5°F | Resp 16

## 2018-11-15 DIAGNOSIS — R42 Dizziness and giddiness: Secondary | ICD-10-CM

## 2018-11-15 DIAGNOSIS — I2510 Atherosclerotic heart disease of native coronary artery without angina pectoris: Secondary | ICD-10-CM | POA: Diagnosis not present

## 2018-11-15 DIAGNOSIS — G4452 New daily persistent headache (NDPH): Secondary | ICD-10-CM

## 2018-11-15 LAB — COMPREHENSIVE METABOLIC PANEL
AG Ratio: 1.6 (calc) (ref 1.0–2.5)
ALKALINE PHOSPHATASE (APISO): 41 U/L (ref 40–115)
ALT: 10 U/L (ref 9–46)
AST: 13 U/L (ref 10–35)
Albumin: 3.6 g/dL (ref 3.6–5.1)
BUN: 18 mg/dL (ref 7–25)
CO2: 30 mmol/L (ref 20–32)
Calcium: 9.1 mg/dL (ref 8.6–10.3)
Chloride: 99 mmol/L (ref 98–110)
Creat: 0.97 mg/dL (ref 0.70–1.11)
Globulin: 2.3 g/dL (calc) (ref 1.9–3.7)
Glucose, Bld: 187 mg/dL — ABNORMAL HIGH (ref 65–99)
Potassium: 4.2 mmol/L (ref 3.5–5.3)
Sodium: 138 mmol/L (ref 135–146)
Total Bilirubin: 0.3 mg/dL (ref 0.2–1.2)
Total Protein: 5.9 g/dL — ABNORMAL LOW (ref 6.1–8.1)

## 2018-11-15 LAB — CBC WITH DIFFERENTIAL/PLATELET
Absolute Monocytes: 914 cells/uL (ref 200–950)
BASOS ABS: 58 {cells}/uL (ref 0–200)
Basophils Relative: 0.8 %
Eosinophils Absolute: 94 cells/uL (ref 15–500)
Eosinophils Relative: 1.3 %
HCT: 46.1 % (ref 38.5–50.0)
Hemoglobin: 15.7 g/dL (ref 13.2–17.1)
Lymphs Abs: 1469 cells/uL (ref 850–3900)
MCH: 32.2 pg (ref 27.0–33.0)
MCHC: 34.1 g/dL (ref 32.0–36.0)
MCV: 94.5 fL (ref 80.0–100.0)
MPV: 10.6 fL (ref 7.5–12.5)
Monocytes Relative: 12.7 %
Neutro Abs: 4666 cells/uL (ref 1500–7800)
Neutrophils Relative %: 64.8 %
Platelets: 237 10*3/uL (ref 140–400)
RBC: 4.88 10*6/uL (ref 4.20–5.80)
RDW: 12.4 % (ref 11.0–15.0)
Total Lymphocyte: 20.4 %
WBC: 7.2 10*3/uL (ref 3.8–10.8)

## 2018-11-15 NOTE — Progress Notes (Signed)
Subjective:    Patient ID: Barry [name], male    DOB: 29-Aug-1933, 85 y.o.   MRN: [id number]  Patient presents for Vertigo (x weeks- HA, dizziness, weakness)  Patient here with ongoing headaches and dizzy spells for the past few weeks.  He was seen on the 14th after he was in the emergency room CT of head not show any acute bleed or stroke.  His labs were fairly unremarkable.  There was concern his symptoms started when he was taken off his Ativan and placed on Valium and therefore he was put back on the Ativan.  States that anxiety was still uncontrolled when his Ativan was seem to wear off he was taking every 4 hours he would get dizzy have severe headaches again.  He was changed to clonazepam on the 21st by his psychiatrist but he does not see any improvement with that.  States that his blood sugars have been normal I did decrease his Amaryl after his A1c returned at 6.7% he denies any hypoglycemia symptoms.  His blood pressure is also been good.  He has not had any falls but states that his legs are so weak along with the headaches that he has been using a cane or a walker.  Today he is actually in a wheelchair but he is able to ambulate by himself. Denies  any fever cough or cold symptoms, UA recently normal  His headaches are severe going to the left side of his head.  They do improve with Advil.  Here with step daughter  Review Of Systems:  GEN- denies fatigue, fever, weight loss,+weakness, recent illness HEENT- denies eye drainage, change in vision, nasal discharge, CVS- denies chest pain, palpitations RESP- denies SOB, cough, wheeze ABD- denies N/V, change in stools, abd pain GU- denies dysuria, hematuria, dribbling, incontinence MSK- denies joint pain, muscle aches, injury Neuro- +headache,+ dizziness, syncope, seizure activity       Objective:    BP (!) 148/72   Pulse 70   Temp (!) 97.5 F (36.4 C) (Oral)   Resp 16   SpO2 98%  GEN- NAD, alert and oriented x3 HEENT-  PERRL, EOMI, non injected sclera, pink conjunctiva, MMM, oropharynx clear Neck- Supple,no bruit, neg spurlings  CVS- RRR, no murmur RESP-CTAB ABD-NABS,soft,NT,ND CNII-XII in tact, gait instability, moving all 4 ext, normal tone Psych- normal affect and mood  EXT- No edema Pulses- Radial  2+        Assessment & Plan:      Problem List Items Addressed This Visit    None    Visit Diagnoses    Dizziness    -  Primary   His daily headache with weakness and dizziness is concerning. recommend he cut back on the  benzo as he feels it makes him worse. CUT DOWN TO klonopin 1/2 tab 3 times a day.  Advised him not to take every 4 hours and spacing it out.  There is no sign of infection.  His last set of labs were fairly unremarkable get a repeat his metabolic panel today.  We will go ahead and proceed with MRI of the brain he does have some gait instability which he did not have before.  His children are going to watch and on him.  His appetite is good.  His blood sugars are normal and there is no orthostatic hypotension on exam.  MRIs to rule out cerebral infarct versus cerebral aneurysm   Relevant Orders   CBC with Differential/Platelet  Comprehensive metabolic panel   New daily persistent headache       Relevant Orders   CBC with Differential/Platelet   Comprehensive metabolic panel   MR Brain Wo Contrast      Note: This dictation was prepared with Dragon dictation along with smaller phrase technology. Any transcriptional errors that result from this process are unintentional.

## 2018-11-15 NOTE — Progress Notes (Signed)
Orthostatic Blood Pressure:     HR  O2 Lying:   144/90  66  97  Sitting:  142/88  58  99  Standing:  154/90  58  98  Standing x2: 142/80  68  98

## 2018-11-15 NOTE — Patient Instructions (Addendum)
Take 1/2 tablet of klonopin three times a day  Blood work to be done MRI to be done  Use the cane/walker  Okay to use ibuprofen or aleve  We will call with lab results

## 2018-11-23 ENCOUNTER — Ambulatory Visit (HOSPITAL_COMMUNITY): Payer: Medicare Other

## 2018-11-28 ENCOUNTER — Ambulatory Visit (INDEPENDENT_AMBULATORY_CARE_PROVIDER_SITE_OTHER): Payer: Medicare Other | Admitting: Family Medicine

## 2018-11-28 ENCOUNTER — Other Ambulatory Visit: Payer: Self-pay

## 2018-11-28 ENCOUNTER — Encounter: Payer: Self-pay | Admitting: Family Medicine

## 2018-11-28 VITALS — BP 148/82 | HR 64 | Temp 97.9°F | Resp 14 | Ht 69.0 in | Wt 173.0 lb

## 2018-11-28 DIAGNOSIS — I2510 Atherosclerotic heart disease of native coronary artery without angina pectoris: Secondary | ICD-10-CM | POA: Diagnosis not present

## 2018-11-28 DIAGNOSIS — F341 Dysthymic disorder: Secondary | ICD-10-CM | POA: Diagnosis not present

## 2018-11-28 DIAGNOSIS — R51 Headache: Secondary | ICD-10-CM | POA: Diagnosis not present

## 2018-11-28 DIAGNOSIS — R6889 Other general symptoms and signs: Secondary | ICD-10-CM

## 2018-11-28 DIAGNOSIS — I6521 Occlusion and stenosis of right carotid artery: Secondary | ICD-10-CM | POA: Diagnosis not present

## 2018-11-28 DIAGNOSIS — Z0000 Encounter for general adult medical examination without abnormal findings: Secondary | ICD-10-CM

## 2018-11-28 DIAGNOSIS — R42 Dizziness and giddiness: Secondary | ICD-10-CM

## 2018-11-28 DIAGNOSIS — Z125 Encounter for screening for malignant neoplasm of prostate: Secondary | ICD-10-CM | POA: Diagnosis not present

## 2018-11-28 DIAGNOSIS — I2583 Coronary atherosclerosis due to lipid rich plaque: Secondary | ICD-10-CM | POA: Diagnosis not present

## 2018-11-28 DIAGNOSIS — I10 Essential (primary) hypertension: Secondary | ICD-10-CM | POA: Diagnosis not present

## 2018-11-28 DIAGNOSIS — E119 Type 2 diabetes mellitus without complications: Secondary | ICD-10-CM | POA: Diagnosis not present

## 2018-11-28 DIAGNOSIS — G8929 Other chronic pain: Secondary | ICD-10-CM

## 2018-11-28 MED ORDER — CLONAZEPAM 0.5 MG PO TABS: 0.5000 mg | ORAL_TABLET | Freq: Three times a day (TID) | ORAL | 2 refills | Status: DC | PRN

## 2018-11-28 MED ORDER — MECLIZINE HCL 12.5 MG PO TABS: 12.5000 mg | ORAL_TABLET | Freq: Two times a day (BID) | ORAL | 0 refills | Status: DC | PRN

## 2018-11-28 NOTE — Patient Instructions (Signed)
MRI and carotids to be done in March - [place]  We will call with labs  Klonpin changed to the 0.5mg  three times a day Meclizine given  F/U 4 months

## 2018-11-28 NOTE — Progress Notes (Signed)
Subjective:   Patient presents for Medicare Annual/Subsequent preventive examination.    Pt here for wellness exam, ongoing weakness, dizziness headaches, he was scheduled for MRI after last visit, he cancelled this appt it was for 2/6 I also cut back his klonopin to 1/2 tablet three times a day, he stated it was making him dizzy, he had already been on ativan and valium, klonopinlast given by his psychiatrist.  His labs showed his A1C was  6.7% 1 month ago, advised to decrease  amaryl to 1mg  in morning and 0.5mg  at dinner  His CBC/CMET were unremarkable on 1/29   Weight unchanged 173-174lbs   Hyperlipidemia- due for lipid panel   He is here with is son. He has been having trouble splitting the klonopin, he feels he isnt getting the full dose, sometimes pill just crumbles, he does get a headache 15 minutes before his next dose, son states he sits there looking at the clock and starts to get anxious. He is not needing in the middle of the night anymore, but still feels weak all over. Nothing hurts  Yesterday rode his exercise bike, did some walking Today more fatigued Dizzy spells continue, headaches are better He cancelled MRI last week because he "felt bad"  Blood sugars have been good  Review Past Medical/Family/Social: Per EMR   Risk Factors  Current exercise habits: walks Dietary issues discussed: Yes  Cardiac risk factors: DM/CAD  Depression Screen  (Note: if answer to either of the following is "Yes", a more complete depression screening is indicated)  Over the past two weeks, have you felt down, depressed or hopeless? No Over the past two weeks, have you felt little interest or pleasure in doing things? No Have you lost interest or pleasure in daily life? No Do you often feel hopeless? No Do you cry easily over simple problems? No   Activities of Daily Living  In your present state of health, do you have any difficulty performing the following activities?:  Driving? No   Managing money? No  Feeding yourself? No  Getting from bed to chair? No  Climbing a flight of stairs? No  Preparing food and eating?: No  Bathing or showering? No  Getting dressed: No  Getting to the toilet? No  Using the toilet:No  Moving around from place to place: No  In the past year have you fallen or had a near fall?:No  Are you sexually active? No  Do you have more than one partner? No   Hearing Difficulties: Yes Do you often ask people to speak up or repeat themselves? Yes  Do you experience ringing or noises in your ears? No Do you have difficulty understanding soft or whispered voices? No  Do you feel that you have a problem with memory? No Do you often misplace items? No  Do you feel safe at home? Yes  Cognitive Testing  Alert? Yes Normal Appearance?Yes  Oriented to person? Yes Place? Yes  Time? Yes  Recall of three objects? Yes  Can perform simple calculations? Yes  Displays appropriate judgment?Yes  Can read the correct time from a watch face?Yes   List the Names of Other Physician/Practitioners you currently use: Cardiology  GI, Eye doctor, psychiatry   Screening Tests / Date Colonoscopy - Due                    Zostavax - Refused  Influenza Vaccine  Pneumonia- UTD Tetanus/tdap- UTD   ROS: GEN- denies fatigue, fever, weight  loss,weakness, recent illness HEENT- denies eye drainage, change in vision, nasal discharge, CVS- denies chest pain, palpitations RESP- denies SOB, cough, wheeze ABD- denies N/V, change in stools, abd pain GU- denies dysuria, hematuria, dribbling, incontinence MSK- denies joint pain, muscle aches, injury Neuro- + headache, +dizziness, syncope, seizure activity     Physical: Vitals REviewed GEN- NAD, alert and oriented x3 HEENT- PERRL, EOMI, non injected sclera, pink conjunctiva, MMM, oropharynx clear Neck- Supple, no thryomegaly, carotid bruit L >R CVS- RRR, no murmur RESP-CTAB Neuro-CNII-XII in tact, no new deficits,  gait a little unsteady, no nystagmus EXT- No edema Pulses- Radial, DP- 2+   Assessment:    Annual wellness medicare exam   Plan:    During the course of the visit the patient was educated and counseled about appropriate screening and preventive services including:    Dizziness/HA- will give meclizine, he does not have at home, change klonopin to 0.5mg  pill take three times a day, his symptoms have improved, anxiety is playing a big factor,but need to r/o white matter disease, CVA. Open MRI to be done, but pt wants in March  Carotid artery disease- repeat carotid duplex was to have in Feb by cardiology   DM- well controlled, no hypoglyemia symptoms  Check B12 with above symptoms   Anxiety /depression- change to klonopin, continue prozac , will send message to his psychiatrist Dr. Harrington Challenger  Discussed PSA screening- he would like testing  No immunizations today   Son at bedside  Diet review for nutrition referral? Yes ____ Not Indicated __x__  Patient Instructions (the written plan) was given to the patient.  Medicare Attestation  I have personally reviewed:  The patient's medical and social history  Their use of alcohol, tobacco or illicit drugs  Their current medications and supplements  The patient's functional ability including ADLs,fall risks, home safety risks, cognitive, and hearing and visual impairment  Diet and physical activities  Evidence for depression or mood disorders  The patient's weight, height, BMI, and visual acuity have been recorded in the chart. I have made referrals, counseling, and provided education to the patient based on review of the above and I have provided the patient with a written personalized care plan for preventive services.

## 2018-11-29 LAB — LIPID PANEL
Cholesterol: 125 mg/dL (ref <200)
HDL: 49 mg/dL (ref >=40)
LDL Cholesterol (Calc): 57 mg/dL (calc)
Non-HDL Cholesterol (Calc): 76 mg/dL (calc) (ref <130)
Total CHOL/HDL Ratio: 2.6 (calc) (ref <5.0)
Triglycerides: 104 mg/dL (ref <150)

## 2018-11-29 LAB — VITAMIN B12: VITAMIN B 12: 226 pg/mL (ref 200–1100)

## 2018-11-29 LAB — PSA: PSA: 0.3 ng/mL (ref <=4.0)

## 2018-12-01 ENCOUNTER — Telehealth: Payer: Self-pay

## 2018-12-01 NOTE — Telephone Encounter (Signed)
-----   Message from Alycia Rossetti, MD sent at 11/29/2018  1:41 PM EST ----- B12 is borderline normal, start 1000mg  once a day OTC Cholesterol is normal

## 2018-12-01 NOTE — Telephone Encounter (Signed)
Informed patient of lab results and recommendations. °

## 2018-12-04 ENCOUNTER — Other Ambulatory Visit: Payer: Self-pay | Admitting: Family Medicine

## 2018-12-05 ENCOUNTER — Other Ambulatory Visit: Payer: Self-pay

## 2018-12-05 ENCOUNTER — Encounter: Payer: Self-pay | Admitting: Family Medicine

## 2018-12-05 ENCOUNTER — Ambulatory Visit (INDEPENDENT_AMBULATORY_CARE_PROVIDER_SITE_OTHER): Payer: Medicare Other | Admitting: Family Medicine

## 2018-12-05 DIAGNOSIS — F32A Depression, unspecified: Secondary | ICD-10-CM | POA: Insufficient documentation

## 2018-12-05 DIAGNOSIS — F418 Other specified anxiety disorders: Secondary | ICD-10-CM

## 2018-12-05 DIAGNOSIS — I6521 Occlusion and stenosis of right carotid artery: Secondary | ICD-10-CM

## 2018-12-05 NOTE — Assessment & Plan Note (Signed)
Again per previous notes he does have high anxiety but also having dizzy spells and headaches and we need to rule out brain pathology.  He has MRI scheduled for March 1 along with a carotid duplex.  Initially he wanted to cancel the carotid ultrasound part of the appointment for on 2 different days but now he agrees to do both.  He has decreased his clonazepam down to 0.25 mg he states that he feels okay on this dose.  We will continue the 0.25 mg 3 times a day if he does not he wake up in the middle the night with anxiety attack he may take another 0.25 mg.  Prefer not to change his overall medications until we see the MRI to make sure there is no other underlying pathology contributing to his symptoms.  I discussed this with him and his son this is the second visit doing so.  If his MRI does come back normal now but discussed with his psychiatrist about tapering off of the benzos altogether and additional medications for his anxiety.  Note he does have good days like yesterday he mowed his yard did some exercise and felt fine but then today he has had dizzy spells headaches I think he may be overexerting himself in general.

## 2018-12-05 NOTE — Progress Notes (Signed)
Subjective:    Patient ID: Barry [name], male    DOB: 1933-07-24, 84 y.o.   MRN: [id number]  Patient presents for Medication Discussion (Clonazepam/ Meclizine- states that he wants to stop Clonazepam and does not see any improvement with Meclizine)  Here to discuss his medications.  He was seen last week about discussion of his anxiety along with his dizzy spells and headaches.  He did try the meclizine it did not help.  He still gets headaches about 15 minutes before his clonazepam was supposed to "wear off" but he did decrease the clonazepam down to 0.25 mg because he felt the point was still too strong.  He is now asking if he can take another 1 in the middle of the night if he gets really anxious.  He is also continued to feel weak and have random headaches.  But then states that he was able to mow his yard yesterday and do some exercises and felt good.  He is eating fairly well.  States that his blood sugars have been good this morning it was 150 but he ate right before midnight.  He has not had any falls.  He was also asking if there are other anxiety medications he could be on.  Of note his son was present again today.  He denies any fever cough congestion new shortness of breath or chest pain   Review Of Systems:  GEN- denies fatigue, fever, weight loss,weakness, recent illness HEENT- denies eye drainage, change in vision, nasal discharge, CVS- denies chest pain, palpitations RESP- denies SOB, cough, wheeze ABD- denies N/V, change in stools, abd pain GU- denies dysuria, hematuria, dribbling, incontinence MSK- denies joint pain, muscle aches, injury Neuro- denies headache, +dizziness, syncope, seizure activity       Objective:    BP (!) 146/86   Pulse 64   Temp 97.9 F (36.6 C) (Oral)   Resp 16   Ht 5\' 9"  (1.753 m)   Wt 173 lb (78.5 kg)   SpO2 97%   BMI 25.55 kg/m  GEN- NAD, alert and oriented x3 HEENT- PERRL, EOMI, non injected sclera, pink conjunctiva, MMM,  oropharynx clear CVS- RRR, no murmur RESP-CTAB Neuro-CNII-XII in tact, no new deficits, gait a little unsteady, no nystagmus Psych- normal affect and mood, not overly anxious  EXT- No edema Pulses- Radial  2+        Assessment & Plan:      Problem List Items Addressed This Visit      Unprioritized   Anxiety with depression    Again per previous notes he does have high anxiety but also having dizzy spells and headaches and we need to rule out brain pathology.  He has MRI scheduled for March 1 along with a carotid duplex.  Initially he wanted to cancel the carotid ultrasound part of the appointment for on 2 different days but now he agrees to do both.  He has decreased his clonazepam down to 0.25 mg he states that he feels okay on this dose.  We will continue the 0.25 mg 3 times a day if he does not he wake up in the middle the night with anxiety attack he may take another 0.25 mg.  Prefer not to change his overall medications until we see the MRI to make sure there is no other underlying pathology contributing to his symptoms.  I discussed this with him and his son this is the second visit doing so.  If his MRI does come  back normal now but discussed with his psychiatrist about tapering off of the benzos altogether and additional medications for his anxiety.  Note he does have good days like yesterday he mowed his yard did some exercise and felt fine but then today he has had dizzy spells headaches I think he may be overexerting himself in general.         Note: This dictation was prepared with Dragon dictation along with smaller phrase technology. Any transcriptional errors that result from this process are unintentional.

## 2018-12-05 NOTE — Patient Instructions (Signed)
Continue the 1/2 tablet of klonopin We will get you an appointment with vascular  Keep MRI and Ultrasound appointment No other changes to medications F/U as previous

## 2018-12-10 ENCOUNTER — Other Ambulatory Visit: Payer: Self-pay

## 2018-12-13 ENCOUNTER — Telehealth: Payer: Self-pay | Admitting: *Deleted

## 2018-12-13 NOTE — Telephone Encounter (Signed)
Received call from patient.   Reports that he has been having increased indigestion and heart burn. Reports that he has not had BM x4 days.   Denies pain, fever. MD advised that patient can take antacid and miralax as long as there is no abd pain.   Call placed to patient and patient made aware.

## 2018-12-15 ENCOUNTER — Other Ambulatory Visit: Payer: Self-pay | Admitting: Family Medicine

## 2018-12-17 ENCOUNTER — Ambulatory Visit
Admission: RE | Admit: 2018-12-17 | Discharge: 2018-12-17 | Disposition: A | Discharge status: Home / Self Care | Payer: Medicare Other | Source: Ambulatory Visit | Attending: Family Medicine | Admitting: Family Medicine

## 2018-12-17 DIAGNOSIS — R42 Dizziness and giddiness: Secondary | ICD-10-CM | POA: Diagnosis not present

## 2018-12-17 DIAGNOSIS — G8929 Other chronic pain: Secondary | ICD-10-CM

## 2018-12-17 DIAGNOSIS — R51 Headache: Principal | ICD-10-CM

## 2018-12-18 ENCOUNTER — Ambulatory Visit
Admission: RE | Admit: 2018-12-18 | Discharge: 2018-12-18 | Disposition: A | Discharge status: Home / Self Care | Payer: Medicare Other | Source: Ambulatory Visit | Attending: Family Medicine | Admitting: Family Medicine

## 2018-12-18 DIAGNOSIS — I6523 Occlusion and stenosis of bilateral carotid arteries: Secondary | ICD-10-CM | POA: Diagnosis not present

## 2018-12-18 DIAGNOSIS — I6521 Occlusion and stenosis of right carotid artery: Secondary | ICD-10-CM

## 2018-12-21 ENCOUNTER — Encounter (HOSPITAL_COMMUNITY): Payer: Self-pay

## 2018-12-21 ENCOUNTER — Other Ambulatory Visit: Payer: Self-pay

## 2018-12-21 ENCOUNTER — Emergency Department (HOSPITAL_COMMUNITY): Payer: Medicare Other

## 2018-12-21 ENCOUNTER — Emergency Department (HOSPITAL_COMMUNITY)
Admission: EM | Admit: 2018-12-21 | Discharge: 2018-12-21 | Disposition: A | Discharge status: Home / Self Care | Payer: Medicare Other | Attending: Emergency Medicine | Admitting: Emergency Medicine

## 2018-12-21 DIAGNOSIS — Z7982 Long term (current) use of aspirin: Secondary | ICD-10-CM | POA: Diagnosis not present

## 2018-12-21 DIAGNOSIS — Z79899 Other long term (current) drug therapy: Secondary | ICD-10-CM | POA: Diagnosis not present

## 2018-12-21 DIAGNOSIS — E119 Type 2 diabetes mellitus without complications: Secondary | ICD-10-CM | POA: Diagnosis not present

## 2018-12-21 DIAGNOSIS — K5900 Constipation, unspecified: Secondary | ICD-10-CM | POA: Insufficient documentation

## 2018-12-21 DIAGNOSIS — R1084 Generalized abdominal pain: Secondary | ICD-10-CM | POA: Insufficient documentation

## 2018-12-21 DIAGNOSIS — F17210 Nicotine dependence, cigarettes, uncomplicated: Secondary | ICD-10-CM | POA: Diagnosis not present

## 2018-12-21 DIAGNOSIS — R0789 Other chest pain: Secondary | ICD-10-CM | POA: Diagnosis not present

## 2018-12-21 DIAGNOSIS — I10 Essential (primary) hypertension: Secondary | ICD-10-CM | POA: Diagnosis not present

## 2018-12-21 DIAGNOSIS — R079 Chest pain, unspecified: Secondary | ICD-10-CM | POA: Diagnosis not present

## 2018-12-21 DIAGNOSIS — R5383 Other fatigue: Secondary | ICD-10-CM

## 2018-12-21 DIAGNOSIS — K5730 Diverticulosis of large intestine without perforation or abscess without bleeding: Secondary | ICD-10-CM | POA: Diagnosis not present

## 2018-12-21 DIAGNOSIS — K769 Liver disease, unspecified: Secondary | ICD-10-CM | POA: Diagnosis not present

## 2018-12-21 LAB — CBC WITH DIFFERENTIAL/PLATELET
Abs Immature Granulocytes: 0.03 10*3/uL (ref 0.00–0.07)
Basophils Absolute: 0.1 10*3/uL (ref 0.0–0.1)
Basophils Relative: 1 %
EOS PCT: 2 %
Eosinophils Absolute: 0.2 10*3/uL (ref 0.0–0.5)
HEMATOCRIT: 48.1 % (ref 39.0–52.0)
Hemoglobin: 16 g/dL (ref 13.0–17.0)
Immature Granulocytes: 0 %
Lymphocytes Relative: 25 %
Lymphs Abs: 1.9 10*3/uL (ref 0.7–4.0)
MCH: 31.4 pg (ref 26.0–34.0)
MCHC: 33.3 g/dL (ref 30.0–36.0)
MCV: 94.5 fL (ref 80.0–100.0)
Monocytes Absolute: 1 10*3/uL (ref 0.1–1.0)
Monocytes Relative: 13 %
Neutro Abs: 4.5 10*3/uL (ref 1.7–7.7)
Neutrophils Relative %: 59 %
Platelets: 234 10*3/uL (ref 150–400)
RBC: 5.09 MIL/uL (ref 4.22–5.81)
RDW: 13.4 % (ref 11.5–15.5)
WBC: 7.7 10*3/uL (ref 4.0–10.5)
nRBC: 0 % (ref 0.0–0.2)

## 2018-12-21 LAB — LIPASE, BLOOD: Lipase: 32 U/L (ref 11–51)

## 2018-12-21 LAB — COMPREHENSIVE METABOLIC PANEL
ALK PHOS: 49 U/L (ref 38–126)
ALT: 14 U/L (ref 0–44)
AST: 19 U/L (ref 15–41)
Albumin: 3.6 g/dL (ref 3.5–5.0)
Anion gap: 12 (ref 5–15)
BUN: 20 mg/dL (ref 8–23)
CALCIUM: 9.7 mg/dL (ref 8.9–10.3)
CO2: 27 mmol/L (ref 22–32)
Chloride: 100 mmol/L (ref 98–111)
Creatinine, Ser: 1.11 mg/dL (ref 0.61–1.24)
GFR calc Af Amer: >60 mL/min (ref >60)
Glucose, Bld: 153 mg/dL — ABNORMAL HIGH (ref 70–99)
Potassium: 3.5 mmol/L (ref 3.5–5.1)
Sodium: 139 mmol/L (ref 135–145)
TOTAL PROTEIN: 6.3 g/dL — AB (ref 6.5–8.1)
Total Bilirubin: 0.6 mg/dL (ref 0.3–1.2)

## 2018-12-21 LAB — I-STAT TROPONIN, ED
TROPONIN I, POC: 0.01 ng/mL (ref 0.00–0.08)
Troponin i, poc: 0.01 ng/mL (ref 0.00–0.08)

## 2018-12-21 LAB — URINALYSIS, ROUTINE W REFLEX MICROSCOPIC
Bacteria, UA: NONE SEEN
Bilirubin Urine: NEGATIVE
Glucose, UA: NEGATIVE mg/dL
HGB URINE DIPSTICK: NEGATIVE
Ketones, ur: NEGATIVE mg/dL
Leukocytes,Ua: NEGATIVE
Nitrite: NEGATIVE
Protein, ur: 30 mg/dL — AB
Specific Gravity, Urine: 1.013 (ref 1.005–1.030)
pH: 7 (ref 5.0–8.0)

## 2018-12-21 LAB — LACTIC ACID, PLASMA
Lactic Acid, Venous: 1.1 mmol/L (ref 0.5–1.9)
Lactic Acid, Venous: 1 mmol/L (ref 0.5–1.9)

## 2018-12-21 LAB — PROTIME-INR
INR: 1 (ref 0.8–1.2)
Prothrombin Time: 13.3 seconds (ref 11.4–15.2)

## 2018-12-21 LAB — TSH: TSH: 1.266 u[IU]/mL (ref 0.350–4.500)

## 2018-12-21 MED ORDER — IOHEXOL 350 MG/ML SOLN
100.0000 mL | Freq: Once | INTRAVENOUS | Status: AC | PRN
  Administered 2018-12-21: 100 mL via INTRAVENOUS

## 2018-12-21 NOTE — ED Provider Notes (Signed)
[facility] EMERGENCY DEPARTMENT Provider Note   CSN: [id number] Arrival date & time: 12/21/18  8676    History   Chief Complaint Chief Complaint  Patient presents with  . Abdominal Pain  . Chest Pain    HPI Barry [name] is a 85 y.o. male.     The history is provided by the patient and medical records. No language interpreter was used.  Abdominal Pain  Pain location:  Generalized Pain quality: aching, bloating, cramping and dull   Pain radiates to:  Chest and L shoulder Pain severity:  Moderate Onset quality:  Sudden Duration:  1 day Timing:  Intermittent Progression:  Resolved Chronicity:  New Context: previous surgery   Context: not trauma   Ineffective treatments:  None tried Associated symptoms: chest pain, constipation, fatigue, flatus (decreased) and shortness of breath   Associated symptoms: no chills, no cough, no diarrhea, no dysuria, no fever, no nausea and no vomiting   Chest Pain  Pain location:  L lateral chest Pain quality: burning and dull   Pain radiates to:  L shoulder and neck Pain severity:  Moderate Onset quality:  Gradual Timing:  Intermittent Progression:  Resolved Chronicity:  Recurrent Relieved by:  Nothing Worsened by:  Nothing Ineffective treatments:  None tried Associated symptoms: abdominal pain, fatigue, heartburn and shortness of breath   Associated symptoms: no altered mental status, no back pain, no cough, no diaphoresis, no dizziness, no fever, no headache, no lower extremity edema, no nausea, no numbness, no palpitations, no vomiting and no weakness   Risk factors: aortic disease, diabetes mellitus, hypertension, male sex and smoking     Past Medical History:  Diagnosis Date  . AAA (abdominal aortic aneurysm) (HCC) 3-09   4.7 cm  . Allergy    rhinitis  . Anxiety   . Cancer ([place])    skin  . Cataract   . Depression   . Diabetes mellitus   . Diverticulosis   . GERD (gastroesophageal reflux disease)    . Hepatitis   . Hx of colonic polyps 08/26/2015  . Hyperlipidemia   . Hypertension   . Nephrolithiasis   . Personal history of colonic polyps    adenomas, serrated also  . PVD (peripheral vascular disease) ([place])   . Tobacco abuse     Patient Active Problem List   Diagnosis Date Noted  . Anxiety with depression 12/05/2018  . Seasonal and perennial allergic rhinitis 08/09/2018  . Carotid artery disease ([place]) 11/18/2017  . Hx of colonic polyps 08/26/2015  . Erectile dysfunction 02/17/2015  . AAA (abdominal aortic aneurysm) without rupture ([place]) 05/21/2014  . Chest pain 01/31/2014  . CAD (coronary artery disease) 12/24/2013  . HEARING LOSS, CONDUCTIVE, LEFT 02/23/2010  . DIZZINESS, CHRONIC 05/28/2008  . Abdominal aortic aneurysm ([place]) 12/13/2007  . PULMONARY NODULE 09/07/2007  . Diabetes mellitus type II, controlled ([place]) 09/06/2007  . Hyperlipidemia 09/06/2007  . TOBACCO ABUSE 09/06/2007  . Essential hypertension 09/06/2007  . ALLERGIC RHINITIS 09/06/2007  . GERD 09/06/2007  . BASAL CELL CARCINOMA, NOSE 09/06/2007    Past Surgical History:  Procedure Laterality Date  . COLONOSCOPY  2006, 2008, 2010, 05/27/2011   numerous adenomas - 13 in 2006, 3, 2008, 2 2012 (up to 1 cm), 4 diminutive adenomas, serrated adenomas 2012. Diverticulosis and hemorrhoids also.  Marland Kitchen EYE SURGERY    . EYE SURGERY Left   . Kidney stone operation    . lower limb amputation, other toe 5th    . percutaneous  stent graft repair of infrarenal AAA  11/10   5.6 cm (T. early)  . surgical excision basal cell carcinoma    . tympanic eardrum repair    . VASECTOMY          Home Medications    Prior to Admission medications   Medication Sig Start Date End Date Taking? Authorizing Provider  aspirin EC 81 MG tablet Take 81 mg by mouth every morning.    [provider]  clonazePAM (KLONOPIN) 0.5 MG tablet Take 1 tablet (0.5 mg total) by mouth 3 (three) times daily as needed for anxiety. 11/28/18    Alycia Rossetti, MD  ezetimibe (ZETIA) 10 MG tablet TAKE 1 TABLET BY MOUTH DAILY 11/06/18   Lelon Perla, MD  FLUoxetine (PROZAC) 20 MG capsule Take 1 capsule (20 mg total) by mouth every morning. 10/25/18   Cloria Spring, MD  glimepiride (AMARYL) 1 MG tablet TAKE 1 TABLET BY MOUTH PO Q AM AND (1/2) TAB PO Q PM 11/09/18   [place], Modena Nunnery, MD  hydrochlorothiazide (HYDRODIURIL) 25 MG tablet TAKE 1 TABLET BY MOUTH DAILY 11/06/18   [place], Modena Nunnery, MD  ibuprofen (ADVIL,MOTRIN) 200 MG tablet Take 400 mg by mouth every 4 (four) hours as needed for mild pain.    [provider]  meclizine (ANTIVERT) 12.5 MG tablet Take 1 tablet (12.5 mg total) by mouth 2 (two) times daily as needed for dizziness. 11/28/18   [place], Modena Nunnery, MD  metoprolol succinate (TOPROL-XL) 25 MG 24 hr tablet TAKE ONE-HALF TABLET BY MOUTH DAILY. GENERIC EQUIVALENT FOR TOPROL XL 12/04/18   [place], Modena Nunnery, MD  montelukast (SINGULAIR) 10 MG tablet Take 1 tablet (10 mg total) by mouth at bedtime. 08/09/18   Valentina Shaggy, MD  NIFEdipine (PROCARDIA-XL/NIFEDICAL-XL) 30 MG 24 hr tablet TAKE 1 TABLET BY MOUTH DAILY 09/18/18   Alycia Rossetti, MD  ramipril (ALTACE) 10 MG capsule Take 1 capsule (10 mg total) by mouth daily. 10/31/18   Alycia Rossetti, MD  simvastatin (ZOCOR) 40 MG tablet TAKE 1 TABLET BY MOUTH AT BEDTIME 12/15/18   Alycia Rossetti, MD    Family History Family History  Problem Relation Age of Onset  . Emphysema Father   . Cancer Brother        Lung  . Depression Brother   . Depression Sister   . Lung disease Sister        Fibrosis and died from pneumonia on Apr 05, 2013  . Kidney disease Sister   . Diabetes Sister   . Learning disabilities Sister   . Mental retardation Sister   . Heart disease Mother   . Cancer Brother   . Colon cancer Neg Hx   . Dementia Neg Hx   . Alcohol abuse Neg Hx   . Drug abuse Neg Hx   . Paranoid behavior Neg Hx   . Schizophrenia Neg Hx   . Anxiety disorder Neg  Hx   . Bipolar disorder Neg Hx   . OCD Neg Hx   . Sexual abuse Neg Hx   . Physical abuse Neg Hx   . Seizures Neg Hx   . Esophageal cancer Neg Hx   . Stomach cancer Neg Hx   . Rectal cancer Neg Hx     Social History Social History   Tobacco Use  . Smoking status: Current Every Day Smoker    Packs/day: 1.00    Years: 60.00    Pack years: 60.00  Types: Cigarettes  . Smokeless tobacco: Never Used  . Tobacco comment: smokes about 16-20 cigs a day as of 04/04/2013  Substance Use Topics  . Alcohol use: No  . Drug use: No     Allergies   Lisinopril-hydrochlorothiazide and Doxycycline   Review of Systems Review of Systems  Constitutional: Positive for fatigue. Negative for chills, diaphoresis and fever.  HENT: Negative for congestion and rhinorrhea.   Eyes: Negative for visual disturbance.  Respiratory: Positive for shortness of breath. Negative for cough, choking, chest tightness and wheezing.   Cardiovascular: Positive for chest pain. Negative for palpitations and leg swelling.  Gastrointestinal: Positive for abdominal pain, constipation, flatus (decreased) and heartburn. Negative for blood in stool, diarrhea, nausea and vomiting.  Genitourinary: Negative for decreased urine volume, discharge, dysuria, flank pain, frequency, penile pain, penile swelling, scrotal swelling and testicular pain.  Musculoskeletal: Positive for neck pain. Negative for back pain and neck stiffness.  Skin: Negative for rash and wound.  Neurological: Negative for dizziness, weakness, light-headedness, numbness and headaches.  Psychiatric/Behavioral: Negative for agitation.  All other systems reviewed and are negative.    Physical Exam Updated Vital Signs BP (!) 156/80 (BP Location: Right Arm)   Pulse 61   Temp 98.1 F (36.7 C) (Oral)   Resp 17   Ht 5\' 9"  (1.753 m)   Wt 78.5 kg   SpO2 97%   BMI 25.55 kg/m   Physical Exam Vitals signs and nursing note reviewed.  Constitutional:       General: He is not in acute distress.    Appearance: He is well-developed. He is not ill-appearing, toxic-appearing or diaphoretic.  HENT:     Head: Normocephalic and atraumatic.     Mouth/Throat:     Pharynx: No pharyngeal swelling or oropharyngeal exudate.  Eyes:     General: No scleral icterus.    Extraocular Movements: Extraocular movements intact.     Conjunctiva/sclera: Conjunctivae normal.     Pupils: Pupils are equal, round, and reactive to light.  Neck:     Musculoskeletal: Neck supple.  Cardiovascular:     Rate and Rhythm: Normal rate and regular rhythm.     Heart sounds: Murmur present.  Pulmonary:     Effort: Pulmonary effort is normal. No respiratory distress.     Breath sounds: Normal breath sounds. No wheezing, rhonchi or rales.  Chest:     Chest wall: No tenderness.  Abdominal:     General: Abdomen is flat. Bowel sounds are normal. There is no distension.     Palpations: Abdomen is soft.     Tenderness: There is no abdominal tenderness. There is no right CVA tenderness, left CVA tenderness, guarding or rebound.  Musculoskeletal:     Right ankle: He exhibits normal pulse. No tenderness.     Left ankle: He exhibits normal pulse. No tenderness.  Skin:    General: Skin is warm and dry.     Capillary Refill: Capillary refill takes less than 2 seconds.     Findings: No rash.  Neurological:     General: No focal deficit present.     Mental Status: He is alert and oriented to person, place, and time.     Cranial Nerves: No cranial nerve deficit.  Psychiatric:        Mood and Affect: Mood normal.        Behavior: Behavior normal.      ED Treatments / Results  Labs (all labs ordered are listed, but only abnormal results  are displayed) Labs Reviewed  COMPREHENSIVE METABOLIC PANEL - Abnormal; Notable for the following components:      Result Value   Glucose, Bld 153 (*)    Total Protein 6.3 (*)    All other components within normal limits  URINALYSIS, ROUTINE W  REFLEX MICROSCOPIC - Abnormal; Notable for the following components:   APPearance TURBID (*)    Protein, ur 30 (*)    All other components within normal limits  URINE CULTURE  CBC WITH DIFFERENTIAL/PLATELET  LIPASE, BLOOD  TSH  LACTIC ACID, PLASMA  LACTIC ACID, PLASMA  PROTIME-INR  I-STAT TROPONIN, ED  I-STAT TROPONIN, ED    EKG EKG Interpretation  Date/Time:  Thursday December 21 2018 06:59:00 EST Ventricular Rate:  61 PR Interval:    QRS Duration: 100 QT Interval:  413 QTC Calculation: 416 R Axis:   48 Text Interpretation:  Sinus rhythm Ventricular premature complex When compared to prior, more wandering baseline and a new PVC.  No STEMI Confirmed by Antony Blackbird [phone]) on 12/21/2018 7:31:57 AM   Radiology Ct Angio Chest/abd/pel For Dissection W And/or Wo Contrast  Result Date: 12/21/2018 CLINICAL DATA:  Chest and back pain.  Rule out aneurysm/dissection EXAM: CT ANGIOGRAPHY CHEST, ABDOMEN AND PELVIS TECHNIQUE: Multidetector CT imaging through the chest, abdomen and pelvis was performed using the standard protocol during bolus administration of intravenous contrast. Multiplanar reconstructed images and MIPs were obtained and reviewed to evaluate the vascular anatomy. CONTRAST:  156mL OMNIPAQUE IOHEXOL 350 MG/ML SOLN COMPARISON:  CTA abdomen and pelvis 05/14/2014 FINDINGS: CTA CHEST FINDINGS Cardiovascular: Moderate aortic atherosclerosis. Diffuse calcifications in the left anterior descending coronary artery with scattered calcifications in the left circumflex and right coronary arteries. No evidence of aneurysm or dissection. Heart is normal size. Mediastinum/Nodes: No mediastinal, hilar, or axillary adenopathy. Lungs/Pleura: Biapical scarring. Dependent densities in the lower lobes could reflect atelectasis or scarring. Nodule along the right minor fissure measures 7 mm on image 81. No effusions. Musculoskeletal: Chest wall soft tissues are unremarkable. No acute bony abnormality.  Review of the MIP images confirms the above findings. CTA ABDOMEN AND PELVIS FINDINGS VASCULAR Aorta: Atherosclerotic change. Aorto bi-iliac stent graft noted. Aneurysm sac 2.5 cm in diameter. No evidence of endoleak. Celiac: Patent. SMA: Patent. Renals: No visible significant stenosis. IMA: Occluded proximally, fills via collaterals. Inflow: Stent graft in the common iliac arteries. No endoleak or aneurysm. Veins: Grossly unremarkable. Review of the MIP images confirms the above findings. NON-VASCULAR Hepatobiliary: Enhancing lesion within the dome of the liver is again noted, likely and hand seen hemangioma or FNH. Small gallstone layering in the gallbladder. Pancreas: No focal abnormality or ductal dilatation. Spleen: No focal abnormality.  Normal size. Adrenals/Urinary Tract: Nodularity within the left adrenal gland is stable. Right adrenal gland unremarkable. Small cysts in the kidneys bilaterally. No hydronephrosis. Urinary bladder unremarkable. Stomach/Bowel: Sigmoid diverticulosis. No active diverticulitis. Stomach and small bowel decompressed, unremarkable. Normal appendix. Lymphatic: No adenopathy Reproductive: No visible focal abnormality. Other: No free fluid or free air. Musculoskeletal: No acute bony abnormality. Review of the MIP images confirms the above findings. IMPRESSION: Prior aorto bi-iliac stent graft repair of AAA. No aortic aneurysm or dissection. Moderate coronary artery disease. Areas of scarring in the lungs. 7 mm nodule along the right minor fissure is likely benign scarring. Non-contrast chest CT at 6-12 months is recommended. If the nodule is stable at time of repeat CT, then future CT at 18-24 months (from today's scan) is considered optional for low-risk patients, but is  recommended for high-risk patients. This recommendation follows the consensus statement: Guidelines for Management of Incidental Pulmonary Nodules Detected on CT Images: From the Fleischner Society 2017; Radiology  2017; 284:228-243. Enhancing lesion in the dome of the liver is again noted, likely benign hemangioma or FNH. Nodular areas in the left adrenal gland, stable. Sigmoid diverticulosis. No acute findings in the chest, abdomen or pelvis. Electronically Signed   By: Rolm Baptise M.D.   On: 12/21/2018 09:24    Procedures Procedures (including critical care time)  Medications Ordered in ED Medications  iohexol (OMNIPAQUE) 350 MG/ML injection 100 mL (100 mLs Intravenous Contrast Given 12/21/18 0907)     Initial Impression / Assessment and Plan / ED Course  I have reviewed the triage vital signs and the nursing notes.  Pertinent labs & imaging results that were available during my care of the patient were reviewed by me and considered in my medical decision making (see chart for details).        Barry [name] is a 85 y.o. male with a past medical history significant for diabetes, hypertension, hyperlipidemia, GERD, and AAA status post repair, carotid disease status post recent carotid Doppler, tobacco abuse, and kidney stones who presents with several complaints including abdominal pain, decreased flatus, no bowel movement for 5 days, abdominal bloating, intermittent severe fatigue/generalized weakness, left chest pain, left shoulder pain, left neck and jaw pain.  Patient reports that he has been having intermittent fatigue and weakness for the last several weeks but reports that he has been having some abdominal symptoms for last few days.  He reports that he has had some abdominal bloating and feeling constipated.  He reports he has not had a bowel movement in the last 5 days despite taking MiraLAX as directed.  He is also having decreased flatus.  He reports the pain in his abdomen has radiated towards his left chest and he thinks it might be indigestion.  However he reports that he woke up last night with severe discomfort rating into his left shoulder and left neck.  He says that on Sunday he had  a carotid duplex of his neck and has had some intermittent left neck discomfort since.  He denies any new neurologic deficits.  He denies severe headaches, neck stiffness, nausea, vomiting, or coordination normalities.  He reports the left lateral chest pain is more burning and aching in sensation similar to prior indigestion.  He denies anterior chest discomfort.  He denies any fevers, chills, congestion, or cough.  He does report that he has some chronic shortness of breath that is unchanged.  He denies new leg pain or leg swelling.  No new numbness, tingling, or focal weakness of unilateral leg.  He reports generalized weakness in his legs with his fatigue.  He denies recent trauma reports his abdominal pain was a 4 5 out of 10 in severity overnight.  Of note, patient reports that he had no abdominal pain when his aortic aneurysm was found and subsequently repaired.  On exam, patient has palpable DP pulses in lower extremities bilaterally.  Normal sensation and strength in both legs.  Abdomen has no significant tenderness and does not appear distended on my initial exam.  Bowel sounds were appreciated.  Lungs were clear.  Patient has a loud systolic murmur which was appreciated in both sides of the neck.  Possible bruit bilaterally.  Patient has symmetric radial pulses.  No chest or back tenderness.  No focal neurologic deficits on my initial exam.  Clinically I am concerned about multiple etiologies causing his symptoms.  Most benign would be constipation indigestion given his history of the same however with his history of abdominal surgery with aorta repair, reported bloating, decreased flatus, decreased bowel movement, patient will need to have obstruction ruled out.  We will also get a dissection study to make sure this is not his aorta causing problems.  Patient will also have troponin and EKG and imaging of the chest to look for a cardiac cause of symptoms given his age and comorbidities with the  pain in his shoulder and left jaw with the left lateral chest pain.  Patient is currently chest pain-free on my evaluation.  Anticipate reassessment after work-up.  Patient's work-up was overall reassuring.  Delta troponin negative.  Delta lactic acid negative.  CT scan showed no evidence of acute dissection or aortic change.  No significant abnormalities that could be causing his symptoms seen on the imaging.  Patient was feeling better on exam.  He suspect this is constipation.  Low suspicion for cardiac cause of his symptoms.  Patient agreed with a sure decision in conversation for outpatient follow-up and double his MiraLAX.  He will follow-up with his PCP for further management.  Patient and family had no other questions or concerns and patient was discharged in good condition after reassuring work-up.   Final Clinical Impressions(s) / ED Diagnoses   Final diagnoses:  Generalized abdominal pain  Atypical chest pain  Fatigue, unspecified type  Constipation, unspecified constipation type    ED Discharge Orders    None     Clinical Impression: 1. Generalized abdominal pain   2. Atypical chest pain   3. Fatigue, unspecified type   4. Constipation, unspecified constipation type     Disposition: Discharge  Condition: Good  I have discussed the results, Dx and Tx plan with the pt(& family if present). He/she/they expressed understanding and agree(s) with the plan. Discharge instructions discussed at great length. Strict return precautions discussed and pt &/or family have verbalized understanding of the instructions. No further questions at time of discharge.    Discharge Medication List as of 12/21/2018 12:55 PM      Follow Up: Alycia Rossetti, MD [address] [place] [address] [phone]     [facility] EMERGENCY DEPARTMENT [address] [id number] mc [place]       [place],  Gwenyth Allegra, MD 12/21/18 1640

## 2018-12-21 NOTE — Discharge Instructions (Signed)
Your work-up today was overall reassuring.  The imaging did not show evidence of worsening changes in your aorta and your prior surgical sites.  We did see some constipation on our review together on the images suspect this is the cause of your discomfort.  Given your well appearance and ability to eat and drink and we feel you are safe for discharge home to maintain hydration and double the MiraLAX regimen.  Please follow-up with your primary doctor for further recommendations on bowel regimen.  If any symptoms change or worsen, please return to the nearest emergency department daily.

## 2018-12-21 NOTE — ED Triage Notes (Signed)
Pt arrives to ED with complaints of ongoing indigestion, mid-chest pain, and left arm pain since early this morning. Pt states he has been taking "everything under the sun" for his indigestion. Pt took 81mg  asa pta.

## 2018-12-21 NOTE — ED Notes (Signed)
Patient transported to CT 

## 2018-12-22 LAB — URINE CULTURE: CULTURE: NO GROWTH

## 2018-12-25 ENCOUNTER — Ambulatory Visit (HOSPITAL_COMMUNITY): Payer: Self-pay | Admitting: Psychiatry

## 2018-12-28 ENCOUNTER — Other Ambulatory Visit: Payer: Self-pay | Admitting: Family Medicine

## 2019-01-10 ENCOUNTER — Ambulatory Visit (HOSPITAL_COMMUNITY): Payer: Medicare Other | Admitting: Psychiatry

## 2019-01-19 ENCOUNTER — Other Ambulatory Visit: Payer: Self-pay | Admitting: Family Medicine

## 2019-01-22 ENCOUNTER — Other Ambulatory Visit: Payer: Self-pay | Admitting: Family Medicine

## 2019-03-05 ENCOUNTER — Other Ambulatory Visit: Payer: Self-pay

## 2019-03-05 ENCOUNTER — Ambulatory Visit (INDEPENDENT_AMBULATORY_CARE_PROVIDER_SITE_OTHER): Payer: Medicare Other | Admitting: Family Medicine

## 2019-03-05 ENCOUNTER — Encounter: Payer: Self-pay | Admitting: Family Medicine

## 2019-03-05 DIAGNOSIS — J301 Allergic rhinitis due to pollen: Secondary | ICD-10-CM

## 2019-03-05 DIAGNOSIS — I6521 Occlusion and stenosis of right carotid artery: Secondary | ICD-10-CM | POA: Diagnosis not present

## 2019-03-05 DIAGNOSIS — J0100 Acute maxillary sinusitis, unspecified: Secondary | ICD-10-CM

## 2019-03-05 MED ORDER — AZITHROMYCIN 250 MG PO TABS: ORAL_TABLET | ORAL | 0 refills | Status: DC

## 2019-03-05 NOTE — Progress Notes (Signed)
Virtual Visit via Telephone Note  I connected with Barry [name] on 03/05/19 at 2:37pm  by telephone and verified that I am speaking with the correct person using two identifiers.     Pt location: at home   Physician location:  In office, [facility], Vic Blackbird MD     On call: patient and physician   I discussed the limitations, risks, security and privacy concerns of performing an evaluation and management service by telephone and the availability of in person appointments. I also discussed with the patient that there may be a patient responsible charge related to this service. The patient expressed understanding and agreed to proceed.   History of Present Illness:  Sinus pressure, drainage for past 2 weeks, has headache behind eyes. Gets nauseous as well, takes alka seltzer which helped stomach. COughs up some mucous.  Has history of sinusitis No fever, no SOB , some sneezing  No vomiting or diarrhea  Given Singulair by allergist in Oct, but he never took, states all anti-histamines make his nervous  Request zpak Observations/Objective: NAD, normal speech, normal WOB  Assessment and Plan: Acute sinusitis/allergic rhinitis- given zpak, use nasal saline , does not tolerate anti-histamins  F.U in office July for OV and labs Call back if not improved, gets fever or other lower respiratory symptoms   Follow Up Instructions:    I discussed the assessment and treatment plan with the patient. The patient was provided an opportunity to ask questions and all were answered. The patient agreed with the plan and demonstrated an understanding of the instructions.   The patient was advised to call back or seek an in-person evaluation if the symptoms worsen or if the condition fails to improve as anticipated.  I provided 7  minutes of non-face-to-face time during this encounter. End time: 2:44pm  Vic Blackbird, MD

## 2019-03-19 ENCOUNTER — Other Ambulatory Visit: Payer: Self-pay | Admitting: Family Medicine

## 2019-04-06 ENCOUNTER — Other Ambulatory Visit: Payer: Self-pay | Admitting: Family Medicine

## 2019-04-11 ENCOUNTER — Other Ambulatory Visit: Payer: Self-pay | Admitting: Family Medicine

## 2019-04-25 ENCOUNTER — Other Ambulatory Visit: Payer: Self-pay | Admitting: Family Medicine

## 2019-04-26 NOTE — Telephone Encounter (Signed)
Ok to refill??  Last office visit 03/05/2019.  Last refill 11/28/2018, #2 refills.

## 2019-04-27 ENCOUNTER — Other Ambulatory Visit: Payer: Medicare Other

## 2019-04-27 ENCOUNTER — Other Ambulatory Visit: Payer: Self-pay

## 2019-04-27 DIAGNOSIS — E119 Type 2 diabetes mellitus without complications: Secondary | ICD-10-CM | POA: Diagnosis not present

## 2019-04-27 DIAGNOSIS — E7800 Pure hypercholesterolemia, unspecified: Secondary | ICD-10-CM | POA: Diagnosis not present

## 2019-04-27 DIAGNOSIS — I10 Essential (primary) hypertension: Secondary | ICD-10-CM | POA: Diagnosis not present

## 2019-04-28 LAB — CBC WITH DIFFERENTIAL/PLATELET
Absolute Monocytes: 736 cells/uL (ref 200–950)
Basophils Absolute: 58 cells/uL (ref 0–200)
Basophils Relative: 0.9 %
Eosinophils Absolute: 102 cells/uL (ref 15–500)
Eosinophils Relative: 1.6 %
HCT: 44.8 % (ref 38.5–50.0)
Hemoglobin: 14.9 g/dL (ref 13.2–17.1)
Lymphs Abs: 1722 cells/uL (ref 850–3900)
MCH: 31.7 pg (ref 27.0–33.0)
MCHC: 33.3 g/dL (ref 32.0–36.0)
MCV: 95.3 fL (ref 80.0–100.0)
MPV: 10.2 fL (ref 7.5–12.5)
Monocytes Relative: 11.5 %
Neutro Abs: 3782 cells/uL (ref 1500–7800)
Neutrophils Relative %: 59.1 %
Platelets: 227 10*3/uL (ref 140–400)
RBC: 4.7 10*6/uL (ref 4.20–5.80)
RDW: 12.6 % (ref 11.0–15.0)
Total Lymphocyte: 26.9 %
WBC: 6.4 10*3/uL (ref 3.8–10.8)

## 2019-04-28 LAB — COMPREHENSIVE METABOLIC PANEL
AG Ratio: 1.6 (calc) (ref 1.0–2.5)
ALT: 9 U/L (ref 9–46)
AST: 13 U/L (ref 10–35)
Albumin: 3.5 g/dL — ABNORMAL LOW (ref 3.6–5.1)
Alkaline phosphatase (APISO): 44 U/L (ref 35–144)
BUN: 18 mg/dL (ref 7–25)
CO2: 30 mmol/L (ref 20–32)
Calcium: 8.9 mg/dL (ref 8.6–10.3)
Chloride: 103 mmol/L (ref 98–110)
Creat: 1.01 mg/dL (ref 0.70–1.11)
Globulin: 2.2 g/dL (calc) (ref 1.9–3.7)
Glucose, Bld: 131 mg/dL — ABNORMAL HIGH (ref 65–99)
Potassium: 4 mmol/L (ref 3.5–5.3)
Sodium: 140 mmol/L (ref 135–146)
Total Bilirubin: 0.4 mg/dL (ref 0.2–1.2)
Total Protein: 5.7 g/dL — ABNORMAL LOW (ref 6.1–8.1)

## 2019-04-28 LAB — HEMOGLOBIN A1C
Hgb A1c MFr Bld: 6.7 % of total Hgb — ABNORMAL HIGH (ref <5.7)
Mean Plasma Glucose: 146 (calc)
eAG (mmol/L): 8.1 (calc)

## 2019-04-28 LAB — LIPID PANEL
Cholesterol: 117 mg/dL (ref <200)
HDL: 46 mg/dL (ref >=40)
LDL Cholesterol (Calc): 57 mg/dL (calc)
Non-HDL Cholesterol (Calc): 71 mg/dL (calc) (ref <130)
Total CHOL/HDL Ratio: 2.5 (calc) (ref <5.0)
Triglycerides: 63 mg/dL (ref <150)

## 2019-04-30 ENCOUNTER — Ambulatory Visit (INDEPENDENT_AMBULATORY_CARE_PROVIDER_SITE_OTHER): Payer: Medicare Other | Admitting: Family Medicine

## 2019-04-30 ENCOUNTER — Encounter: Payer: Self-pay | Admitting: Family Medicine

## 2019-04-30 ENCOUNTER — Other Ambulatory Visit: Payer: Self-pay

## 2019-04-30 VITALS — BP 148/80 | HR 60 | Temp 98.1°F | Resp 16 | Ht 69.0 in | Wt 172.0 lb

## 2019-04-30 DIAGNOSIS — E119 Type 2 diabetes mellitus without complications: Secondary | ICD-10-CM | POA: Diagnosis not present

## 2019-04-30 DIAGNOSIS — I2510 Atherosclerotic heart disease of native coronary artery without angina pectoris: Secondary | ICD-10-CM | POA: Diagnosis not present

## 2019-04-30 DIAGNOSIS — F418 Other specified anxiety disorders: Secondary | ICD-10-CM | POA: Diagnosis not present

## 2019-04-30 DIAGNOSIS — I10 Essential (primary) hypertension: Secondary | ICD-10-CM

## 2019-04-30 DIAGNOSIS — I2583 Coronary atherosclerosis due to lipid rich plaque: Secondary | ICD-10-CM | POA: Diagnosis not present

## 2019-04-30 DIAGNOSIS — I6521 Occlusion and stenosis of right carotid artery: Secondary | ICD-10-CM

## 2019-04-30 DIAGNOSIS — K5900 Constipation, unspecified: Secondary | ICD-10-CM

## 2019-04-30 DIAGNOSIS — E441 Mild protein-calorie malnutrition: Secondary | ICD-10-CM | POA: Diagnosis not present

## 2019-04-30 DIAGNOSIS — E46 Unspecified protein-calorie malnutrition: Secondary | ICD-10-CM | POA: Insufficient documentation

## 2019-04-30 NOTE — Assessment & Plan Note (Signed)
Doing well on current regimen Has not followed up with psychiatry secondary to the pandemic

## 2019-04-30 NOTE — Assessment & Plan Note (Signed)
I recommend that he try MiraLAX 1 capful daily

## 2019-04-30 NOTE — Assessment & Plan Note (Signed)
His blood pressure looks okay for his age no changes to his medications

## 2019-04-30 NOTE — Progress Notes (Signed)
   Subjective:    Patient ID: Barry [name], male    DOB: 12-25-1932, 85 y.o.   MRN: [id number]  Patient presents for Follow-up (has had labs)  Pt here to f/u chronic medical problems. Fasting labs reviewed  States he is doing okay today.  Anxiety- taking klonopin 0.25mg  TID he is doing okay with this. Has not seen hispsychiatrist recently   DM- taking medications as prescribed- amaryl 1mg  twice a day, A1C came back at 6.7%, often has to eat a snack at bedtime , does fine during the day   Cardiology appt coming up this sept  He has been more constipated recently did try taking Ex-Lax and Mylanta  Fasting labs and meds reviewed at bedside   Review Of Systems:  GEN- denies fatigue, fever, weight loss,weakness, recent illness HEENT- denies eye drainage, change in vision, nasal discharge, CVS- denies chest pain, palpitations RESP- denies SOB, cough, wheeze ABD- denies N/V, change in stools, abd pain GU- denies dysuria, hematuria, dribbling, incontinence MSK- denies joint pain, muscle aches, injury Neuro- denies headache, dizziness, syncope, seizure activity       Objective:    BP (!) 148/80   Pulse 60   Temp 98.1 F (36.7 C) (Oral)   Resp 16   Ht 5\' 9"  (1.753 m)   Wt 172 lb (78 kg)   SpO2 97%   BMI 25.40 kg/m  GEN- NAD, alert and oriented x3 HEENT- PERRL, EOMI, non injected sclera, pink conjunctiva, MMM, oropharynx clear Neck- Supple, no thryomegaly, CVS- RRR,  Soft systolic murmur RESP-CTAB abd-nabs,soft,NT,ND EXT- No edema Pulses- Radial, DP- 2+       Assessment & Plan:      Problem List Items Addressed This Visit      Unprioritized   Anxiety with depression    Doing well on current regimen Has not followed up with psychiatry secondary to the pandemic      CAD (coronary artery disease)    He is asymptomatic.  We will follow-up with cardiology in the fall. He does have a soft systolic murmur noted today we will continue to follow.      Constipation    I recommend that he try MiraLAX 1 capful daily      Diabetes mellitus type II, controlled ([place])    Diabetes well controlled no change in medications.  We discussed adding protein shake as his his albumin and total protein were a little low.      Relevant Orders   HM DIABETES FOOT EXAM (Completed)   Essential hypertension - Primary    His blood pressure looks okay for his age no changes to his medications      Protein-calorie malnutrition ([place])      Note: This dictation was prepared with Dragon dictation along with smaller phrase technology. Any transcriptional errors that result from this process are unintentional.

## 2019-04-30 NOTE — Assessment & Plan Note (Signed)
Diabetes well controlled no change in medications.  We discussed adding protein shake as his his albumin and total protein were a little low.

## 2019-04-30 NOTE — Assessment & Plan Note (Addendum)
He is asymptomatic.  We will follow-up with cardiology in the fall. He does have a soft systolic murmur noted today we will continue to follow.

## 2019-04-30 NOTE — Patient Instructions (Addendum)
Add Boost or Ensure for protein  Take miralax 1 cap full daily for constipation  Labs look good  F/U 4 months

## 2019-05-05 ENCOUNTER — Encounter (HOSPITAL_COMMUNITY): Payer: Self-pay | Admitting: Emergency Medicine

## 2019-05-05 ENCOUNTER — Other Ambulatory Visit: Payer: Self-pay

## 2019-05-05 ENCOUNTER — Emergency Department (HOSPITAL_COMMUNITY)
Admission: EM | Admit: 2019-05-05 | Discharge: 2019-05-05 | Disposition: A | Discharge status: Home / Self Care | Payer: Medicare Other | Attending: Emergency Medicine | Admitting: Emergency Medicine

## 2019-05-05 DIAGNOSIS — Z7984 Long term (current) use of oral hypoglycemic drugs: Secondary | ICD-10-CM | POA: Diagnosis not present

## 2019-05-05 DIAGNOSIS — X58XXXA Exposure to other specified factors, initial encounter: Secondary | ICD-10-CM | POA: Diagnosis not present

## 2019-05-05 DIAGNOSIS — Y9289 Other specified places as the place of occurrence of the external cause: Secondary | ICD-10-CM | POA: Diagnosis not present

## 2019-05-05 DIAGNOSIS — E119 Type 2 diabetes mellitus without complications: Secondary | ICD-10-CM | POA: Diagnosis not present

## 2019-05-05 DIAGNOSIS — I10 Essential (primary) hypertension: Secondary | ICD-10-CM | POA: Insufficient documentation

## 2019-05-05 DIAGNOSIS — F17210 Nicotine dependence, cigarettes, uncomplicated: Secondary | ICD-10-CM | POA: Diagnosis not present

## 2019-05-05 DIAGNOSIS — Y9389 Activity, other specified: Secondary | ICD-10-CM | POA: Diagnosis not present

## 2019-05-05 DIAGNOSIS — Z79899 Other long term (current) drug therapy: Secondary | ICD-10-CM | POA: Insufficient documentation

## 2019-05-05 DIAGNOSIS — T162XXA Foreign body in left ear, initial encounter: Secondary | ICD-10-CM | POA: Insufficient documentation

## 2019-05-05 DIAGNOSIS — Z7982 Long term (current) use of aspirin: Secondary | ICD-10-CM | POA: Insufficient documentation

## 2019-05-05 DIAGNOSIS — Y999 Unspecified external cause status: Secondary | ICD-10-CM | POA: Diagnosis not present

## 2019-05-05 NOTE — ED Provider Notes (Signed)
[facility] EMERGENCY DEPARTMENT Provider Note   CSN: [id number] Arrival date & time: 05/05/19  1959     History   Chief Complaint Chief Complaint  Patient presents with  . Foreign Body in [place] is a 85 y.o. male.     Patient presents with complaint of foreign body in his left ear.  Symptoms started acutely just prior to arrival.  Patient was trying to fit some sort of hearing aid into his left ear when the plastic/rubbery earbud came off and lodged in his ear.  He was unable to remove it at home.  No pain.  Mildly decreased hearing.  No other symptoms.  No other treatments.     Past Medical History:  Diagnosis Date  . AAA (abdominal aortic aneurysm) (HCC) 3-09   4.7 cm  . Allergy    rhinitis  . Anxiety   . Cancer ([place])    skin  . Cataract   . Depression   . Diabetes mellitus   . Diverticulosis   . GERD (gastroesophageal reflux disease)   . Hepatitis   . Hx of colonic polyps 08/26/2015  . Hyperlipidemia   . Hypertension   . Nephrolithiasis   . Personal history of colonic polyps    adenomas, serrated also  . PVD (peripheral vascular disease) ([place])   . Tobacco abuse     Patient Active Problem List   Diagnosis Date Noted  . Constipation 04/30/2019  . Protein-calorie malnutrition ([place]) 04/30/2019  . Anxiety with depression 12/05/2018  . Seasonal and perennial allergic rhinitis 08/09/2018  . Carotid artery disease ([place]) 11/18/2017  . Hx of colonic polyps 08/26/2015  . Erectile dysfunction 02/17/2015  . AAA (abdominal aortic aneurysm) without rupture ([place]) 05/21/2014  . Chest pain 01/31/2014  . CAD (coronary artery disease) 12/24/2013  . HEARING LOSS, CONDUCTIVE, LEFT 02/23/2010  . DIZZINESS, CHRONIC 05/28/2008  . Abdominal aortic aneurysm ([place]) 12/13/2007  . PULMONARY NODULE 09/07/2007  . Diabetes mellitus type II, controlled ([place]) 09/06/2007  . Hyperlipidemia 09/06/2007  . TOBACCO ABUSE 09/06/2007  . Essential hypertension  09/06/2007  . ALLERGIC RHINITIS 09/06/2007  . GERD 09/06/2007  . BASAL CELL CARCINOMA, NOSE 09/06/2007    Past Surgical History:  Procedure Laterality Date  . COLONOSCOPY  2006, 2008, 2010, 05/27/2011   numerous adenomas - 13 in 2006, 3, 2008, 2 2012 (up to 1 cm), 4 diminutive adenomas, serrated adenomas 2012. Diverticulosis and hemorrhoids also.  Marland Kitchen EYE SURGERY    . EYE SURGERY Left   . Kidney stone operation    . lower limb amputation, other toe 5th    . percutaneous stent graft repair of infrarenal AAA  11/10   5.6 cm (T. early)  . surgical excision basal cell carcinoma    . tympanic eardrum repair    . VASECTOMY          Home Medications    Prior to Admission medications   Medication Sig Start Date End Date Taking? Authorizing Provider  aspirin EC 81 MG tablet Take 81 mg by mouth every morning.    [provider]  clobetasol (TEMOVATE) 0.05 % external solution Apply 1 application topically daily as needed (dry scalp).    [provider]  clonazePAM (KLONOPIN) 0.5 MG tablet TAKE 1 TABLET BY MOUTH 3 TIMES DAILY AS NEEDED FOR ANXIETY. 04/26/19   Susy Frizzle, MD  esomeprazole (NEXIUM) 20 MG capsule Take 20 mg by mouth daily at 12 noon.  [provider]  ezetimibe (ZETIA) 10 MG tablet TAKE 1 TABLET BY MOUTH DAILY 11/06/18   Lelon Perla, MD  famotidine (PEPCID) 10 MG tablet Take 10 mg by mouth as needed for heartburn or indigestion.    [provider]  FLUoxetine (PROZAC) 20 MG capsule Take 1 capsule (20 mg total) by mouth every morning. 10/25/18   Cloria Spring, MD  glimepiride (AMARYL) 1 MG tablet TAKE 1 TABLET BY MOUTH TWICE DAILY 01/23/19   Alycia Rossetti, MD  hydrochlorothiazide (HYDRODIURIL) 25 MG tablet TAKE 1 TABLET BY MOUTH DAILY 04/11/19   Alycia Rossetti, MD  ibuprofen (ADVIL,MOTRIN) 200 MG tablet Take 400 mg by mouth every 4 (four) hours as needed for mild pain.    [provider]  meclizine (ANTIVERT) 12.5 MG  tablet Take 1 tablet (12.5 mg total) by mouth 2 (two) times daily as needed for dizziness. 11/28/18   Foley, Modena Nunnery, MD  metoprolol succinate (TOPROL-XL) 25 MG 24 hr tablet TAKE ONE-HALF TABLET BY MOUTH DAILY GENERIC EQUIVALENT FOR TOPROL XL 03/19/19   Loyal, Modena Nunnery, MD  montelukast (SINGULAIR) 10 MG tablet Take 1 tablet (10 mg total) by mouth at bedtime. 08/09/18   Valentina Shaggy, MD  NIFEdipine (PROCARDIA-XL/NIFEDICAL-XL) 30 MG 24 hr tablet TAKE 1 TABLET BY MOUTH EVERY DAY 04/06/19   Murray, Modena Nunnery, MD  polyethylene glycol [facility] / GLYCOLAX) packet Take 17 g by mouth daily as needed for mild constipation.    [provider]  ramipril (ALTACE) 10 MG capsule Take 1 capsule (10 mg total) by mouth daily. 10/31/18   Alycia Rossetti, MD  simvastatin (ZOCOR) 40 MG tablet TAKE 1 TABLET BY MOUTH AT BEDTIME 03/19/19   [place], Modena Nunnery, MD  vitamin B-12 (CYANOCOBALAMIN) 1000 MCG tablet Take 1,000 mcg by mouth daily.    [provider]    Family History Family History  Problem Relation Age of Onset  . Emphysema Father   . Cancer Brother        Lung  . Depression Brother   . Depression Sister   . Lung disease Sister        Fibrosis and died from pneumonia on 04/19/13  . Kidney disease Sister   . Diabetes Sister   . Learning disabilities Sister   . Mental retardation Sister   . Heart disease Mother   . Cancer Brother   . Colon cancer Neg Hx   . Dementia Neg Hx   . Alcohol abuse Neg Hx   . Drug abuse Neg Hx   . Paranoid behavior Neg Hx   . Schizophrenia Neg Hx   . Anxiety disorder Neg Hx   . Bipolar disorder Neg Hx   . OCD Neg Hx   . Sexual abuse Neg Hx   . Physical abuse Neg Hx   . Seizures Neg Hx   . Esophageal cancer Neg Hx   . Stomach cancer Neg Hx   . Rectal cancer Neg Hx     Social History Social History   Tobacco Use  . Smoking status: Current Every Day Smoker    Packs/day: 1.00    Years: 60.00    Pack years: 60.00    Types: Cigarettes   . Smokeless tobacco: Never Used  . Tobacco comment: smokes about 16-20 cigs a day as of 04/04/2013  Substance Use Topics  . Alcohol use: No  . Drug use: No     Allergies   Lisinopril-hydrochlorothiazide and Doxycycline  Review of Systems Review of Systems  HENT: Positive for hearing loss. Negative for ear discharge and ear pain.   Neurological: Negative for headaches.     Physical Exam Updated Vital Signs BP (!) 140/59 (BP Location: Left Arm)   Pulse (!) 59   Temp 97.9 F (36.6 C) (Oral)   Resp 16   Ht 5\' 10"  (1.778 m)   Wt 78 kg   SpO2 98%   BMI 24.68 kg/m   Physical Exam Vitals signs and nursing note reviewed.  Constitutional:      Appearance: He is well-developed.  HENT:     Head: Normocephalic and atraumatic.     Left Ear: Tympanic membrane and external ear normal. A foreign body is present.     Ears:     Comments: There is a translucent white/gray earbud lodged in the canal.  Eyes:     Conjunctiva/sclera: Conjunctivae normal.  Neck:     Musculoskeletal: Normal range of motion and neck supple.  Pulmonary:     Effort: No respiratory distress.  Skin:    General: Skin is warm and dry.  Neurological:     Mental Status: He is alert.      ED Treatments / Results  Labs (all labs ordered are listed, but only abnormal results are displayed) Labs Reviewed - No data to display  EKG None  Radiology No results found.  Procedures .Foreign Body Removal  Date/Time: 05/05/2019 9:46 PM Performed by: Carlisle Cater, PA-C Authorized by: Carlisle Cater, PA-C  Consent: Verbal consent obtained. Consent given by: patient Patient identity confirmed: verbally with patient Body area: ear Location details: left ear Removal mechanism: alligator forceps 1 objects recovered. Objects recovered: earbud Post-procedure assessment: foreign body removed Patient tolerance: patient tolerated the procedure well with no immediate complications Comments: Post-removal exam  normal. No ear canal or TM trauma.    (including critical care time)  Medications Ordered in ED Medications - No data to display   Initial Impression / Assessment and Plan / ED Course  I have reviewed the triage vital signs and the nursing notes.  Pertinent labs & imaging results that were available during my care of the patient were reviewed by me and considered in my medical decision making (see chart for details).        Patient seen and examined.  Foreign body retrieved without any difficulty using alligator forceps.  No TM trauma or ear canal trauma noted.  Patient ready for discharge to home.  Vital signs reviewed and are as follows: BP (!) 140/59 (BP Location: Left Arm)   Pulse (!) 59   Temp 97.9 F (36.6 C) (Oral)   Resp 16   Ht 5\' 10"  (1.778 m)   Wt 78 kg   SpO2 98%   BMI 24.68 kg/m     Final Clinical Impressions(s) / ED Diagnoses   Final diagnoses:  Foreign body of left ear, initial encounter   L ear canal FB, removed, no sequelae.   ED Discharge Orders    None       Carlisle Cater, Hershal Coria 05/05/19 2149    Francine Graven, DO 05/06/19 1531

## 2019-05-05 NOTE — ED Triage Notes (Signed)
Pt was attempting to put part of hearing aid in his left ear and a small white piece came off and is stuck in his left ear.

## 2019-06-13 ENCOUNTER — Telehealth: Payer: Self-pay | Admitting: *Deleted

## 2019-06-13 MED ORDER — AZITHROMYCIN 250 MG PO TABS: ORAL_TABLET | ORAL | 0 refills | Status: DC

## 2019-06-13 NOTE — Telephone Encounter (Signed)
Received call from patient.   Reports that he cut down some shrubs last week and now has some sinus issues. States that he has x2 days of HA, sinus pressure above and below eyes and a slight cough from nasal drainage. Denies fever.  Reports that he has been using nasal saline, Flonase and robitussin for cough. Requested prescription for Z-Pack.   MD please advise.

## 2019-06-13 NOTE — Telephone Encounter (Signed)
Okay to send in zpak, keep using other nasal rinse robitussin

## 2019-06-13 NOTE — Telephone Encounter (Signed)
Prescription sent to pharmacy. .   Call placed to patient and patient made aware.  

## 2019-06-18 ENCOUNTER — Other Ambulatory Visit: Payer: Self-pay | Admitting: Family Medicine

## 2019-06-18 NOTE — Telephone Encounter (Signed)
Requested Prescriptions   Pending Prescriptions Disp Refills  . clonazePAM (KLONOPIN) 0.5 MG tablet [Pharmacy Med Name: CLONAZEPAM 0.5 MG TABLET] 90 tablet 0    Sig: TAKE 1 TABLET BY MOUTH 3 TIMES DAILY AS NEEDED FOR ANXIETY.     Last OV 04/30/2019  Last written 04/26/2019

## 2019-06-25 ENCOUNTER — Other Ambulatory Visit: Payer: Self-pay | Admitting: Family Medicine

## 2019-07-12 ENCOUNTER — Other Ambulatory Visit: Payer: Self-pay

## 2019-07-12 ENCOUNTER — Ambulatory Visit (INDEPENDENT_AMBULATORY_CARE_PROVIDER_SITE_OTHER): Payer: Medicare Other | Admitting: *Deleted

## 2019-07-12 DIAGNOSIS — Z23 Encounter for immunization: Secondary | ICD-10-CM | POA: Diagnosis not present

## 2019-07-12 NOTE — Progress Notes (Signed)
Patient seen in office for Influenza Vaccination.   Tolerated IM administration well.   Immunization history updated.  

## 2019-07-12 NOTE — Progress Notes (Deleted)
HPI: FU CAD. Patient underwent cardiac catheterization in 2005; he was found to have nonobstructive disease other than a 90% stenosis in a small branch of the right coronary artery. He was noted to have an 80% iliac stenosis and an abdominal aortic aneurysm. Procedure complicated by peripheral emboli requiring amputation of a digit. Patient has had stent graft of abdominal aortic aneurysm.  Carotid Dopplers 3/20  showed 1 to 39% bilateral stenosis.  Since last seen,   Current Outpatient Medications  Medication Sig Dispense Refill  . aspirin EC 81 MG tablet Take 81 mg by mouth every morning.    Marland Kitchen azithromycin (ZITHROMAX) 250 MG tablet Take (2) tablets by mouth on day 1, then take (1) tablet by mouth on days 2-5. Dx: J01.90- acute sinusitis. 6 tablet 0  . clobetasol (TEMOVATE) 0.05 % external solution Apply 1 application topically daily as needed (dry scalp).    . clonazePAM (KLONOPIN) 0.5 MG tablet TAKE 1 TABLET BY MOUTH 3 TIMES DAILY AS NEEDED FOR ANXIETY. 90 tablet 2  . esomeprazole (NEXIUM) 20 MG capsule Take 20 mg by mouth daily at 12 noon.    . ezetimibe (ZETIA) 10 MG tablet TAKE 1 TABLET BY MOUTH DAILY 90 tablet 3  . famotidine (PEPCID) 10 MG tablet Take 10 mg by mouth as needed for heartburn or indigestion.    Marland Kitchen FLUoxetine (PROZAC) 20 MG capsule Take 1 capsule (20 mg total) by mouth every morning. 90 capsule 3  . glimepiride (AMARYL) 1 MG tablet TAKE 1 TABLET BY MOUTH TWICE DAILY 180 tablet 3  . hydrochlorothiazide (HYDRODIURIL) 25 MG tablet TAKE 1 TABLET BY MOUTH DAILY 90 tablet 1  . ibuprofen (ADVIL,MOTRIN) 200 MG tablet Take 400 mg by mouth every 4 (four) hours as needed for mild pain.    . meclizine (ANTIVERT) 12.5 MG tablet Take 1 tablet (12.5 mg total) by mouth 2 (two) times daily as needed for dizziness. 30 tablet 0  . metoprolol succinate (TOPROL-XL) 25 MG 24 hr tablet TAKE ONE-HALF TABLET BY MOUTH DAILY GENERIC EQUIVALENT FOR TOPROL XL 45 tablet 3  . montelukast (SINGULAIR)  10 MG tablet Take 1 tablet (10 mg total) by mouth at bedtime. 30 tablet 5  . NIFEdipine (PROCARDIA-XL/NIFEDICAL-XL) 30 MG 24 hr tablet TAKE 1 TABLET BY MOUTH EVERY DAY 90 tablet 0  . polyethylene glycol (MIRALAX / GLYCOLAX) packet Take 17 g by mouth daily as needed for mild constipation.    . ramipril (ALTACE) 10 MG capsule Take 1 capsule (10 mg total) by mouth daily. 90 capsule 2  . simvastatin (ZOCOR) 40 MG tablet TAKE 1 TABLET BY MOUTH AT BEDTIME 90 tablet 0  . vitamin B-12 (CYANOCOBALAMIN) 1000 MCG tablet Take 1,000 mcg by mouth daily.     No current facility-administered medications for this visit.      Past Medical History:  Diagnosis Date  . AAA (abdominal aortic aneurysm) (HCC) 3-09   4.7 cm  . Allergy    rhinitis  . Anxiety   . Cancer ([place])    skin  . Cataract   . Depression   . Diabetes mellitus   . Diverticulosis   . GERD (gastroesophageal reflux disease)   . Hepatitis   . Hx of colonic polyps 08/26/2015  . Hyperlipidemia   . Hypertension   . Nephrolithiasis   . Personal history of colonic polyps    adenomas, serrated also  . PVD (peripheral vascular disease) ([place])   . Tobacco abuse  Past Surgical History:  Procedure Laterality Date  . COLONOSCOPY  2006, 2008, 2010, 05/27/2011   numerous adenomas - 13 in 2006, 3, 2008, 2 2012 (up to 1 cm), 4 diminutive adenomas, serrated adenomas 2012. Diverticulosis and hemorrhoids also.  Marland Kitchen EYE SURGERY    . EYE SURGERY Left   . Kidney stone operation    . lower limb amputation, other toe 5th    . percutaneous stent graft repair of infrarenal AAA  11/10   5.6 cm (T. early)  . surgical excision basal cell carcinoma    . tympanic eardrum repair    . VASECTOMY      Social History   Socioeconomic History  . Marital status: Widowed    Spouse name: Not on file  . Number of children: 2  . Years of education: Not on file  . Highest education level: Not on file  Occupational History    Employer: RETIRED  Social Needs  .  Financial resource strain: Not on file  . Food insecurity    Worry: Not on file    Inability: Not on file  . Transportation needs    Medical: Not on file    Non-medical: Not on file  Tobacco Use  . Smoking status: Current Every Day Smoker    Packs/day: 1.00    Years: 60.00    Pack years: 60.00    Types: Cigarettes  . Smokeless tobacco: Never Used  . Tobacco comment: smokes about 16-20 cigs a day as of 04/04/2013  Substance and Sexual Activity  . Alcohol use: No  . Drug use: No  . Sexual activity: Yes    Partners: Female    Birth control/protection: Surgical    Comment: vasectomy  Lifestyle  . Physical activity    Days per week: Not on file    Minutes per session: Not on file  . Stress: Not on file  Relationships  . Social Herbalist on phone: Not on file    Gets together: Not on file    Attends religious service: Not on file    Active member of club or organization: Not on file    Attends meetings of clubs or organizations: Not on file    Relationship status: Not on file  . Intimate partner violence    Fear of current or ex partner: Not on file    Emotionally abused: Not on file    Physically abused: Not on file    Forced sexual activity: Not on file  Other Topics Concern  . 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 Lives alone. 1- step-daughter, 1 son - '68; 3 grandchildren. does odd-jobs, yard work    Family History  Problem Relation Age of Onset  . Emphysema Father   . Cancer Brother        Lung  . Depression Brother   . Depression Sister   . Lung disease Sister        Fibrosis and died from pneumonia on 2013/04/21  . Kidney disease Sister   . Diabetes Sister   . Learning disabilities Sister   . Mental retardation Sister   . Heart disease Mother   . Cancer Brother   . Colon cancer Neg Hx   . Dementia Neg Hx   . Alcohol abuse Neg Hx   . Drug abuse Neg Hx   . Paranoid  behavior Neg Hx   .  Schizophrenia Neg Hx   . Anxiety disorder Neg Hx   . Bipolar disorder Neg Hx   . OCD Neg Hx   . Sexual abuse Neg Hx   . Physical abuse Neg Hx   . Seizures Neg Hx   . Esophageal cancer Neg Hx   . Stomach cancer Neg Hx   . Rectal cancer Neg Hx     ROS: no fevers or chills, productive cough, hemoptysis, dysphasia, odynophagia, melena, hematochezia, dysuria, hematuria, rash, seizure activity, orthopnea, PND, pedal edema, claudication. Remaining systems are negative.  Physical Exam: Well-developed well-nourished in no acute distress.  Skin is warm and dry.  HEENT is normal.  Neck is supple.  Chest is clear to auscultation with normal expansion.  Cardiovascular exam is regular rate and rhythm.  Abdominal exam nontender or distended. No masses palpated. Extremities show no edema. neuro grossly intact  ECG- personally reviewed  A/P  1 coronary artery disease-pt has not had chest pain.  Continue medical therapy with aspirin and statin.  2 hypertension-blood pressure is controlled.  Continue present medications and follow.  3 hyperlipidemia-patient did not tolerate Lipitor or Crestor previously.  Continue Zocor and Zetia.  Check lipids and liver.  4 tobacco abuse-patient counseled on discontinuing.  5 prior abdominal aortic aneurysm repair-followed by vascular surgery.  Kirk Ruths, MD

## 2019-07-13 ENCOUNTER — Other Ambulatory Visit: Payer: Self-pay | Admitting: Family Medicine

## 2019-07-16 ENCOUNTER — Ambulatory Visit: Payer: Medicare Other | Admitting: Cardiology

## 2019-08-08 DIAGNOSIS — C44219 Basal cell carcinoma of skin of left ear and external auricular canal: Secondary | ICD-10-CM | POA: Diagnosis not present

## 2019-08-08 DIAGNOSIS — C44319 Basal cell carcinoma of skin of other parts of face: Secondary | ICD-10-CM | POA: Diagnosis not present

## 2019-09-17 ENCOUNTER — Other Ambulatory Visit: Payer: Self-pay

## 2019-09-17 MED ORDER — HYDROCHLOROTHIAZIDE 25 MG PO TABS: 25.0000 mg | ORAL_TABLET | Freq: Every day | ORAL | 1 refills | Status: DC

## 2019-09-25 ENCOUNTER — Other Ambulatory Visit: Payer: Self-pay | Admitting: *Deleted

## 2019-09-25 MED ORDER — RAMIPRIL 10 MG PO CAPS: 10.0000 mg | ORAL_CAPSULE | Freq: Every day | ORAL | 3 refills | Status: DC

## 2019-10-02 ENCOUNTER — Other Ambulatory Visit: Payer: Self-pay | Admitting: *Deleted

## 2019-10-02 MED ORDER — SIMVASTATIN 40 MG PO TABS: 40.0000 mg | ORAL_TABLET | Freq: Every day | ORAL | 1 refills | Status: DC

## 2019-10-02 NOTE — Progress Notes (Signed)
HPI: FU CAD. Patient underwent cardiac catheterization in 2005; he was found to have nonobstructive disease other than a 90% stenosis in a small branch of the right coronary artery. He was noted to have an 80% iliac stenosis and an abdominal aortic aneurysm. Procedure complicated by peripheral emboli requiring amputation of a digit. Patient has had stent graft of abdominal aortic aneurysm. Carotid Dopplers March 2020 showed moderate bilateral atherosclerotic plaque right greater than left but not resulting in hemodynamically significant stenosis.  Chest CT March 2020 showed 7 mm nodule right minor fissure and follow-up recommended 6 to 12 months. Since last seen, the patient has dyspnea with more extreme activities but not with routine activities. It is relieved with rest. It is not associated with chest pain. There is no orthopnea, PND or pedal edema. There is no syncope or palpitations. There is no exertional chest pain.   Current Outpatient Medications  Medication Sig Dispense Refill  . aspirin EC 81 MG tablet Take 81 mg by mouth every morning.    . clobetasol (TEMOVATE) 0.05 % external solution Apply 1 application topically daily as needed (dry scalp).    . clonazePAM (KLONOPIN) 0.5 MG tablet TAKE 1 TABLET BY MOUTH 3 TIMES DAILY AS NEEDED FOR ANXIETY. 90 tablet 2  . ezetimibe (ZETIA) 10 MG tablet TAKE 1 TABLET BY MOUTH DAILY 90 tablet 3  . famotidine (PEPCID) 10 MG tablet Take 10 mg by mouth as needed for heartburn or indigestion.    Marland Kitchen FLUoxetine (PROZAC) 20 MG capsule Take 1 capsule (20 mg total) by mouth every morning. 90 capsule 3  . glimepiride (AMARYL) 1 MG tablet TAKE 1 TABLET BY MOUTH TWICE DAILY 180 tablet 3  . hydrochlorothiazide (HYDRODIURIL) 25 MG tablet Take 1 tablet (25 mg total) by mouth daily. 90 tablet 1  . ibuprofen (ADVIL,MOTRIN) 200 MG tablet Take 400 mg by mouth every 4 (four) hours as needed for mild pain.    . meclizine (ANTIVERT) 12.5 MG tablet Take 1 tablet (12.5  mg total) by mouth 2 (two) times daily as needed for dizziness. 30 tablet 0  . metoprolol succinate (TOPROL-XL) 25 MG 24 hr tablet TAKE ONE-HALF TABLET BY MOUTH DAILY GENERIC EQUIVALENT FOR TOPROL XL 45 tablet 3  . montelukast (SINGULAIR) 10 MG tablet Take 1 tablet (10 mg total) by mouth at bedtime. 30 tablet 5  . NIFEdipine (PROCARDIA-XL/NIFEDICAL-XL) 30 MG 24 hr tablet TAKE 1 TABLET BY MOUTH EVERY DAY 90 tablet 0  . polyethylene glycol (MIRALAX / GLYCOLAX) packet Take 17 g by mouth daily as needed for mild constipation.    . ramipril (ALTACE) 10 MG capsule Take 1 capsule (10 mg total) by mouth daily. 90 capsule 3  . simvastatin (ZOCOR) 40 MG tablet Take 1 tablet (40 mg total) by mouth at bedtime. 90 tablet 1  . vitamin B-12 (CYANOCOBALAMIN) 1000 MCG tablet Take 1,000 mcg by mouth daily.     No current facility-administered medications for this visit.     Past Medical History:  Diagnosis Date  . AAA (abdominal aortic aneurysm) (HCC) 3-09   4.7 cm  . Allergy    rhinitis  . Anxiety   . Cancer ([facility])    skin  . Cataract   . Depression   . Diabetes mellitus   . Diverticulosis   . GERD (gastroesophageal reflux disease)   . Hepatitis   . Hx of colonic polyps 08/26/2015  . Hyperlipidemia   . Hypertension   . Nephrolithiasis   .  Personal history of colonic polyps    adenomas, serrated also  . PVD (peripheral vascular disease) ([place])   . Tobacco abuse     Past Surgical History:  Procedure Laterality Date  . COLONOSCOPY  2006, 2008, 2010, 05/27/2011   numerous adenomas - 13 in 2006, 3, 2008, 2 2012 (up to 1 cm), 4 diminutive adenomas, serrated adenomas 2012. Diverticulosis and hemorrhoids also.  Marland Kitchen EYE SURGERY    . EYE SURGERY Left   . Kidney stone operation    . lower limb amputation, other toe 5th    . percutaneous stent graft repair of infrarenal AAA  11/10   5.6 cm (T. early)  . surgical excision basal cell carcinoma    . tympanic eardrum repair    . VASECTOMY      Social  History   Socioeconomic History  . Marital status: Widowed    Spouse name: Not on file  . Number of children: 2  . Years of education: Not on file  . Highest education level: Not on file  Occupational History    Employer: RETIRED  Tobacco Use  . Smoking status: Current Every Day Smoker    Packs/day: 1.00    Years: 60.00    Pack years: 60.00    Types: Cigarettes  . Smokeless tobacco: Never Used  . Tobacco comment: smokes about 16-20 cigs a day as of 04/04/2013  Substance and Sexual Activity  . Alcohol use: No  . Drug use: No  . Sexual activity: Yes    Partners: Female    Birth control/protection: Surgical    Comment: vasectomy  Other Topics Concern  . 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 retired 61 years - keeping busy. Lives alone. 1- step-daughter, 1 son - '68; 3 grandchildren. does odd-jobs, yard work   Scientist, physiological Strain:   . Difficulty of Paying Living Expenses: Not on file  Food Insecurity:   . Worried About Charity fundraiser in the Last Year: Not on file  . Ran Out of Food in the Last Year: Not on file  Transportation Needs:   . Lack of Transportation (Medical): Not on file  . Lack of Transportation (Non-Medical): Not on file  Physical Activity:   . Days of Exercise per Week: Not on file  . Minutes of Exercise per Session: Not on file  Stress:   . Feeling of Stress : Not on file  Social Connections:   . Frequency of Communication with Friends and Family: Not on file  . Frequency of Social Gatherings with Friends and Family: Not on file  . Attends Religious Services: Not on file  . Active Member of Clubs or Organizations: Not on file  . Attends Archivist Meetings: Not on file  . Marital Status: Not on file  Intimate Partner Violence:   . Fear of Current or Ex-Partner: Not on file  . Emotionally Abused: Not on file  . Physically Abused: Not on file  . Sexually  Abused: Not on file    Family History  Problem Relation Age of Onset  . Emphysema Father   . Cancer Brother        Lung  . Depression Brother   . Depression Sister   . Lung disease Sister        Fibrosis and died from pneumonia on Apr 03, 2013  . Kidney disease Sister   . Diabetes Sister   .  Learning disabilities Sister   . Mental retardation Sister   . Heart disease Mother   . Cancer Brother   . Colon cancer Neg Hx   . Dementia Neg Hx   . Alcohol abuse Neg Hx   . Drug abuse Neg Hx   . Paranoid behavior Neg Hx   . Schizophrenia Neg Hx   . Anxiety disorder Neg Hx   . Bipolar disorder Neg Hx   . OCD Neg Hx   . Sexual abuse Neg Hx   . Physical abuse Neg Hx   . Seizures Neg Hx   . Esophageal cancer Neg Hx   . Stomach cancer Neg Hx   . Rectal cancer Neg Hx     ROS: no fevers or chills, productive cough, hemoptysis, dysphasia, odynophagia, melena, hematochezia, dysuria, hematuria, rash, seizure activity, orthopnea, PND, pedal edema, claudication. Remaining systems are negative.  Physical Exam: Well-developed well-nourished in no acute distress.  Skin is warm and dry.  HEENT is normal.  Neck is supple.  Chest is clear to auscultation with normal expansion.  Cardiovascular exam is regular rate and rhythm. 1/6 systolic murmur Abdominal exam nontender or distended. No masses palpated. Extremities show no edema. neuro grossly intact   A/P  1 coronary artery disease-patient without recurrent symptoms.  Plan to continue present medical regimen including aspirin and statin.  2 hypertension-blood pressure is controlled today.  Continue present medications and follow.  3 hyperlipidemia-continue present medical regimen.  He did not tolerate Crestor or Lipitor in the past.  Check lipids and liver.  4 tobacco abuse-patient counseled on discontinuing.  5 history of abdominal aortic aneurysm repair-followed by vascular surgery.  6 pulmonary nodule-I discussed this with patient he  will follow up with Dr. Buelah Manis concerning this issue.  Kirk Ruths, MD

## 2019-10-08 ENCOUNTER — Ambulatory Visit (INDEPENDENT_AMBULATORY_CARE_PROVIDER_SITE_OTHER): Payer: Medicare Other | Admitting: Cardiology

## 2019-10-08 ENCOUNTER — Encounter: Payer: Self-pay | Admitting: Cardiology

## 2019-10-08 ENCOUNTER — Other Ambulatory Visit: Payer: Self-pay

## 2019-10-08 VITALS — BP 136/74 | HR 67 | Temp 96.1°F | Ht 69.0 in | Wt 175.8 lb

## 2019-10-08 DIAGNOSIS — I10 Essential (primary) hypertension: Secondary | ICD-10-CM

## 2019-10-08 DIAGNOSIS — E7800 Pure hypercholesterolemia, unspecified: Secondary | ICD-10-CM

## 2019-10-08 DIAGNOSIS — F17200 Nicotine dependence, unspecified, uncomplicated: Secondary | ICD-10-CM | POA: Diagnosis not present

## 2019-10-08 DIAGNOSIS — I6521 Occlusion and stenosis of right carotid artery: Secondary | ICD-10-CM

## 2019-10-08 DIAGNOSIS — I2510 Atherosclerotic heart disease of native coronary artery without angina pectoris: Secondary | ICD-10-CM | POA: Diagnosis not present

## 2019-10-08 NOTE — Patient Instructions (Signed)
Medication Instructions:  NO CHANGES *If you need a refill on your cardiac medications before your next appointment, please call your pharmacy*  Lab Work: NONE NEEDED If you have labs (blood work) drawn today and your tests are completely normal, you will receive your results only by: Marland Kitchen MyChart Message (if you have MyChart) OR . A paper copy in the mail If you have any lab test that is abnormal or we need to change your treatment, we will call you to review the results.  Testing/Procedures: NONE NEEDED  Follow-Up: At [facility], you and your health needs are our priority.  As part of our continuing mission to provide you with exceptional heart care, we have created designated Provider Care Teams.  These Care Teams include your primary Cardiologist (physician) and Advanced Practice Providers (APPs -  Physician Assistants and Nurse Practitioners) who all work together to provide you with the care you need, when you need it.  Your next appointment:   1 year(s)  The format for your next appointment:   In Person  Provider:   You may see Dr. Stanford Breed or one of the following Advanced Practice Providers on your designated Care Team:    Kerin Ransom, PA-C  [place], [place]  Coletta Memos, [place]

## 2019-10-22 ENCOUNTER — Other Ambulatory Visit: Payer: Self-pay | Admitting: Family Medicine

## 2019-10-30 DIAGNOSIS — Z23 Encounter for immunization: Secondary | ICD-10-CM | POA: Diagnosis not present

## 2019-11-12 ENCOUNTER — Other Ambulatory Visit: Payer: Self-pay

## 2019-11-12 ENCOUNTER — Ambulatory Visit (INDEPENDENT_AMBULATORY_CARE_PROVIDER_SITE_OTHER): Payer: Medicare Other | Admitting: Family Medicine

## 2019-11-12 ENCOUNTER — Encounter: Payer: Self-pay | Admitting: Family Medicine

## 2019-11-12 DIAGNOSIS — J019 Acute sinusitis, unspecified: Secondary | ICD-10-CM | POA: Diagnosis not present

## 2019-11-12 MED ORDER — CETIRIZINE HCL 10 MG PO TABS: 10.0000 mg | ORAL_TABLET | Freq: Every day | ORAL | 2 refills | Status: DC

## 2019-11-12 MED ORDER — AZITHROMYCIN 250 MG PO TABS: ORAL_TABLET | ORAL | 0 refills | Status: DC

## 2019-11-12 MED ORDER — IPRATROPIUM BROMIDE 0.03 % NA SOLN: 2.0000 | Freq: Two times a day (BID) | NASAL | 12 refills | Status: DC

## 2019-11-12 NOTE — Progress Notes (Signed)
Virtual Visit via Telephone Note  I connected with Barry [name] on 11/12/19 at  11:24am  by telephone and verified that I am speaking with the correct person using two identifiers.       Pt location: at home   Physician location:  In office, [facility], Vic Blackbird MD     On call: patient and physician   I discussed the limitations, risks, security and privacy concerns of performing an evaluation and management service by telephone and the availability of in person appointments. I also discussed with the patient that there may be a patient responsible charge related to this service. The patient expressed understanding and agreed to proceed.   History of Present Illness:  states he has been mowing and out his yard. He has had sinus pressure, headache, ears stopped up for the past 2 weeks. He has occ cough with phlegm first thing in the morning but otherwise does not cough.  No chills or body aches. No fever.   No GI symptoms He had his first COVID-19 vaccine 19 at the [facility] 2 weeks  No known sick contacts  He does have history of sinusitis  He is using nasal spray  - atrovent 0.03% , he does not have singulair or the zyrtec    cbg have 130 recently , due for OV for his diabetes    Observations/Objective: NAD noted on phone,speaking in full sentences   Assessment and Plan: Acute sinusitis-underlying allergies and chronic sinusitis.  He has been evaluated by ENT as well as allergy asthma specialist.  He does get these infections every so often.  We will start him on azithromycin as he can tolerate this.  We will also add back in Zyrtec he can continue the Atrovent nasal spray.  He will follow-up in the office in a few weeks for his fasting labs.  If he does decline with worsening respiratory symptoms fever will get Covid testing.  He did receive one of his Covid vaccines  Follow Up Instructions:    I discussed the assessment and treatment plan  with the patient. The patient was provided an opportunity to ask questions and all were answered. The patient agreed with the plan and demonstrated an understanding of the instructions.   The patient was advised to call back or seek an in-person evaluation if the symptoms worsen or if the condition fails to improve as anticipated.  I provided 8 minutes of non-face-to-face time during this encounter. End Time: 11:32am   Vic Blackbird, MD

## 2019-11-29 ENCOUNTER — Other Ambulatory Visit: Payer: Self-pay

## 2019-11-30 DIAGNOSIS — Z23 Encounter for immunization: Secondary | ICD-10-CM | POA: Diagnosis not present

## 2019-12-03 ENCOUNTER — Other Ambulatory Visit: Payer: Self-pay

## 2019-12-03 DIAGNOSIS — I2510 Atherosclerotic heart disease of native coronary artery without angina pectoris: Secondary | ICD-10-CM

## 2019-12-03 MED ORDER — EZETIMIBE 10 MG PO TABS: 10.0000 mg | ORAL_TABLET | Freq: Every day | ORAL | 3 refills | Status: DC

## 2019-12-04 ENCOUNTER — Other Ambulatory Visit: Payer: Self-pay | Admitting: Family Medicine

## 2019-12-04 NOTE — Telephone Encounter (Signed)
Ok to refill??  Last office visit 11/12/2019.  Last refill 06/19/2019, #2 refills.

## 2019-12-17 DIAGNOSIS — C44319 Basal cell carcinoma of skin of other parts of face: Secondary | ICD-10-CM | POA: Diagnosis not present

## 2019-12-17 DIAGNOSIS — Z85828 Personal history of other malignant neoplasm of skin: Secondary | ICD-10-CM | POA: Diagnosis not present

## 2019-12-17 DIAGNOSIS — X32XXXD Exposure to sunlight, subsequent encounter: Secondary | ICD-10-CM | POA: Diagnosis not present

## 2019-12-17 DIAGNOSIS — L570 Actinic keratosis: Secondary | ICD-10-CM | POA: Diagnosis not present

## 2019-12-17 DIAGNOSIS — Z08 Encounter for follow-up examination after completed treatment for malignant neoplasm: Secondary | ICD-10-CM | POA: Diagnosis not present

## 2020-01-07 ENCOUNTER — Other Ambulatory Visit: Payer: Self-pay | Admitting: Family Medicine

## 2020-01-07 ENCOUNTER — Other Ambulatory Visit (HOSPITAL_COMMUNITY): Payer: Self-pay | Admitting: Psychiatry

## 2020-01-22 ENCOUNTER — Other Ambulatory Visit: Payer: Self-pay | Admitting: Family Medicine

## 2020-02-21 ENCOUNTER — Other Ambulatory Visit: Payer: Self-pay

## 2020-02-21 ENCOUNTER — Other Ambulatory Visit: Payer: Medicare Other

## 2020-02-21 DIAGNOSIS — E7800 Pure hypercholesterolemia, unspecified: Secondary | ICD-10-CM

## 2020-02-21 DIAGNOSIS — E119 Type 2 diabetes mellitus without complications: Secondary | ICD-10-CM

## 2020-02-21 DIAGNOSIS — I10 Essential (primary) hypertension: Secondary | ICD-10-CM | POA: Diagnosis not present

## 2020-02-22 ENCOUNTER — Ambulatory Visit (INDEPENDENT_AMBULATORY_CARE_PROVIDER_SITE_OTHER): Payer: Medicare Other | Admitting: Family Medicine

## 2020-02-22 ENCOUNTER — Encounter: Payer: Self-pay | Admitting: Family Medicine

## 2020-02-22 VITALS — BP 148/78 | HR 66 | Temp 97.9°F | Resp 12 | Ht 69.0 in | Wt 178.0 lb

## 2020-02-22 DIAGNOSIS — E7800 Pure hypercholesterolemia, unspecified: Secondary | ICD-10-CM

## 2020-02-22 DIAGNOSIS — F418 Other specified anxiety disorders: Secondary | ICD-10-CM | POA: Diagnosis not present

## 2020-02-22 DIAGNOSIS — I10 Essential (primary) hypertension: Secondary | ICD-10-CM | POA: Diagnosis not present

## 2020-02-22 DIAGNOSIS — N528 Other male erectile dysfunction: Secondary | ICD-10-CM | POA: Diagnosis not present

## 2020-02-22 DIAGNOSIS — E119 Type 2 diabetes mellitus without complications: Secondary | ICD-10-CM

## 2020-02-22 DIAGNOSIS — I714 Abdominal aortic aneurysm, without rupture, unspecified: Secondary | ICD-10-CM

## 2020-02-22 DIAGNOSIS — I2510 Atherosclerotic heart disease of native coronary artery without angina pectoris: Secondary | ICD-10-CM

## 2020-02-22 DIAGNOSIS — I2583 Coronary atherosclerosis due to lipid rich plaque: Secondary | ICD-10-CM

## 2020-02-22 LAB — CBC WITH DIFFERENTIAL/PLATELET
Absolute Monocytes: 817 cells/uL (ref 200–950)
Basophils Absolute: 60 cells/uL (ref 0–200)
Basophils Relative: 0.9 %
Eosinophils Absolute: 127 cells/uL (ref 15–500)
Eosinophils Relative: 1.9 %
HCT: 45.9 % (ref 38.5–50.0)
Hemoglobin: 15.5 g/dL (ref 13.2–17.1)
Lymphs Abs: 1809 cells/uL (ref 850–3900)
MCH: 32.4 pg (ref 27.0–33.0)
MCHC: 33.8 g/dL (ref 32.0–36.0)
MCV: 96 fL (ref 80.0–100.0)
MPV: 10.6 fL (ref 7.5–12.5)
Monocytes Relative: 12.2 %
Neutro Abs: 3886 cells/uL (ref 1500–7800)
Neutrophils Relative %: 58 %
Platelets: 231 10*3/uL (ref 140–400)
RBC: 4.78 10*6/uL (ref 4.20–5.80)
RDW: 13.4 % (ref 11.0–15.0)
Total Lymphocyte: 27 %
WBC: 6.7 10*3/uL (ref 3.8–10.8)

## 2020-02-22 LAB — LIPID PANEL
Cholesterol: 122 mg/dL (ref <200)
HDL: 47 mg/dL (ref >=40)
LDL Cholesterol (Calc): 60 mg/dL (calc)
Non-HDL Cholesterol (Calc): 75 mg/dL (calc) (ref <130)
Total CHOL/HDL Ratio: 2.6 (calc) (ref <5.0)
Triglycerides: 66 mg/dL (ref <150)

## 2020-02-22 LAB — COMPREHENSIVE METABOLIC PANEL
AG Ratio: 1.5 (calc) (ref 1.0–2.5)
ALT: 14 U/L (ref 9–46)
AST: 18 U/L (ref 10–35)
Albumin: 3.7 g/dL (ref 3.6–5.1)
Alkaline phosphatase (APISO): 43 U/L (ref 35–144)
BUN: 18 mg/dL (ref 7–25)
CO2: 31 mmol/L (ref 20–32)
Calcium: 9 mg/dL (ref 8.6–10.3)
Chloride: 102 mmol/L (ref 98–110)
Creat: 1.03 mg/dL (ref 0.70–1.11)
Globulin: 2.4 g/dL (calc) (ref 1.9–3.7)
Glucose, Bld: 139 mg/dL — ABNORMAL HIGH (ref 65–99)
Potassium: 4.2 mmol/L (ref 3.5–5.3)
Sodium: 141 mmol/L (ref 135–146)
Total Bilirubin: 0.4 mg/dL (ref 0.2–1.2)
Total Protein: 6.1 g/dL (ref 6.1–8.1)

## 2020-02-22 LAB — HEMOGLOBIN A1C
Hgb A1c MFr Bld: 6.4 % of total Hgb — ABNORMAL HIGH (ref <5.7)
Mean Plasma Glucose: 137 (calc)
eAG (mmol/L): 7.6 (calc)

## 2020-02-22 MED ORDER — SILDENAFIL CITRATE 20 MG PO TABS: ORAL_TABLET | ORAL | 0 refills | Status: DC

## 2020-02-22 NOTE — Assessment & Plan Note (Signed)
He requested another prescription for Viagra.  Due to cost we will give him sildenafil 20 mg he can take a total of 60 mg 1 hour before intercourse He is not on long acting nitrates

## 2020-02-22 NOTE — Patient Instructions (Addendum)
F/U 4 months for Physical  Try to reduce klonopin to twice a day

## 2020-02-22 NOTE — Assessment & Plan Note (Signed)
Status post remote repair of aortic aneurysm CT scan looks good in March 2020

## 2020-02-22 NOTE — Assessment & Plan Note (Signed)
No current symptoms Followed by cardiology last visit in December.

## 2020-02-22 NOTE — Assessment & Plan Note (Signed)
Continue prozac, he will to try to reduce his benzo and see how he does he will reduce to half a tablet twice a day

## 2020-02-22 NOTE — Assessment & Plan Note (Addendum)
Bp looks okay, multiple meds for his age, nochanges  Renal function is good

## 2020-02-22 NOTE — Progress Notes (Signed)
Subjective:    Patient ID: Barry [name], male    DOB: September 07, 1933, 86 y.o.   MRN: [id number]  Patient presents for Follow-up (has had labs) and R Ear Pressure (feels like it's stopped up, but no wax)  Patient here to follow-up chronic medical problems.  His fasting labs were reviewed at the bedside.  Diabetes mellitus he is currently taking glimepiride 1 mg twice a day his A1c is 6.4% his CBGs at home fasting have been "good" No hypoglycemia symptoms   Hyperlipidemia he is taking Zetia and simvastatin 40 mg once a day LDL is 60 triglycerides 66 liver function tests are normal  Attention he is taking his blood pressure medicine as prescribed without any side effects  GAD- taking 0.25mg  three times a day as needed., he sometimes feels a little off balance in the day not sure if related if to meds but feels it may be , he is also on prozac   Coronary artery disease he has not had any chest pain or shortness of breath he is on baby aspirin along with his other medications He is followed by cardiology his last visit was in December    He is followed by vascular last visit was in 2019 .  He did however have a CT scan to look for in March 2020 Which had previous repair of abdominal aortic aneurysm and no new aneurysm     Ears feel full, she has been taking zyrtec 5mg  once a day which works for him. He cant take a full dose or he feels more jittery    Had eye visit- 2-3 weeks ago, at [facility]    COVID-19 Vaccine UTD  Review Of Systems:  GEN- denies fatigue, fever, weight loss,weakness, recent illness HEENT- denies eye drainage, change in vision, nasal discharge, CVS- denies chest pain, palpitations RESP- denies SOB, cough, wheeze ABD- denies N/V, change in stools, abd pain GU- denies dysuria, hematuria, dribbling, incontinence MSK- denies joint pain, muscle aches, injury Neuro- denies headache, dizziness, syncope, seizure activity       Objective:    BP (!)  148/78   Pulse 66   Temp 97.9 F (36.6 C) (Temporal)   Resp 12   Ht 5\' 9"  (1.753 m)   Wt 178 lb (80.7 kg)   SpO2 94%   BMI 26.29 kg/m  GEN- NAD, alert and oriented x3 HEENT- PERRL, EOMI, non injected sclera, pink conjunctiva, MMM, oropharynx clear, TM Clear no effusion, canals clear  Neck- Supple, no thyromegaly CVS- RRR, soft systolic murmur RESP-CTAB ABD-NABS,soft,NT,ND Psych- normal affect and mood , very pleasant EXT- No edema Pulses- Radial, DP- 2+        Assessment & Plan:      Problem List Items Addressed This Visit      Unprioritized   AAA (abdominal aortic aneurysm) without rupture (HCC)    Status post remote repair of aortic aneurysm CT scan looks good in March 2020      Relevant Medications   sildenafil (REVATIO) 20 MG tablet   Anxiety with depression    Continue prozac, he will to try to reduce his benzo and see how he does he will reduce to half a tablet twice a day      CAD (coronary artery disease) - Primary    No current symptoms Followed by cardiology last visit in December.      Relevant Medications   sildenafil (REVATIO) 20 MG tablet   Diabetes mellitus type II, controlled ([place])  Controlled no change in medication.      Erectile dysfunction    He requested another prescription for Viagra.  Due to cost we will give him sildenafil 20 mg he can take a total of 60 mg 1 hour before intercourse He is not on long acting nitrates       Essential hypertension    Bp looks okay, multiple meds for his age, nochanges  Renal function is good      Relevant Medications   sildenafil (REVATIO) 20 MG tablet   Hyperlipidemia   Relevant Medications   sildenafil (REVATIO) 20 MG tablet      Note: This dictation was prepared with Dragon dictation along with smaller phrase technology. Any transcriptional errors that result from this process are unintentional.

## 2020-02-22 NOTE — Assessment & Plan Note (Signed)
Controlled no change in medication 

## 2020-03-03 ENCOUNTER — Other Ambulatory Visit: Payer: Self-pay | Admitting: Family Medicine

## 2020-03-18 ENCOUNTER — Ambulatory Visit (INDEPENDENT_AMBULATORY_CARE_PROVIDER_SITE_OTHER): Payer: Medicare Other | Admitting: Family Medicine

## 2020-03-18 ENCOUNTER — Other Ambulatory Visit: Payer: Self-pay

## 2020-03-18 ENCOUNTER — Encounter: Payer: Self-pay | Admitting: Family Medicine

## 2020-03-18 VITALS — BP 158/88 | HR 58 | Temp 98.2°F | Resp 16 | Ht 69.0 in | Wt 175.0 lb

## 2020-03-18 DIAGNOSIS — I499 Cardiac arrhythmia, unspecified: Secondary | ICD-10-CM

## 2020-03-18 DIAGNOSIS — R42 Dizziness and giddiness: Secondary | ICD-10-CM | POA: Diagnosis not present

## 2020-03-18 DIAGNOSIS — J0100 Acute maxillary sinusitis, unspecified: Secondary | ICD-10-CM | POA: Diagnosis not present

## 2020-03-18 DIAGNOSIS — I2583 Coronary atherosclerosis due to lipid rich plaque: Secondary | ICD-10-CM

## 2020-03-18 DIAGNOSIS — I2510 Atherosclerotic heart disease of native coronary artery without angina pectoris: Secondary | ICD-10-CM | POA: Diagnosis not present

## 2020-03-18 MED ORDER — AZITHROMYCIN 250 MG PO TABS: ORAL_TABLET | ORAL | 0 refills | Status: DC

## 2020-03-18 MED ORDER — CETIRIZINE HCL 10 MG PO TABS: 10.0000 mg | ORAL_TABLET | Freq: Every day | ORAL | 2 refills | Status: DC

## 2020-03-18 NOTE — Patient Instructions (Addendum)
Use atrovent spray once a day  Take zyrtec Take antibiotics We will schedule with your heart doctor F/U as previous

## 2020-03-18 NOTE — Progress Notes (Signed)
   Subjective:    Patient ID: Barry [name], male    DOB: Mar 10, 1933, 86 y.o.   MRN: [id number]  Patient presents for Sinus Issues (x3 days- sinus pressure, HA, dizziness, post nasal drip, ear pressure)   Pt here with sinus issues sinus pressure ,for the past 3 days, has history of chronic sinusitis issues. He is not sure if he has been using atroven given in Jan, but has been taking meclizine due to dizziness associated. He takes zyrtec sometimes Has sinus HA along with the pressure, blowing, pain beneath both eyes, drainage down throat and feels pressure in his ear  No chest congestion,no fever, no chest pain   He has felt more dizzy on and off past few weeks. He thought it was just his veritgo   Review Of Systems:  GEN- denies fatigue, fever, weight loss,weakness, recent illness HEENT- denies eye drainage, change in vision,+ nasal discharge, CVS- denies chest pain, palpitations RESP- denies SOB, cough, wheeze ABD- denies N/V, change in stools, abd pain GU- denies dysuria, hematuria, dribbling, incontinence MSK- denies joint pain, muscle aches, injury Neuro- +headache,+ dizziness, syncope, seizure activity       Objective:    BP (!) 158/88   Pulse (!) 58   Temp 98.2 F (36.8 C) (Temporal)   Resp 16   Ht 5\' 9"  (1.753 m)   Wt 175 lb (79.4 kg)   SpO2 95%   BMI 25.84 kg/m  GEN- NAD, alert and oriented x3 HEENT- PERRL, EOMI, non injected sclera, pink conjunctiva, MMM, oropharynx clear, nares turbinates enlarged, TTP maxillary sinus bilat, TM clear no effusion Neck- Supple, no thyromegaly, no LAD CVS- bradycardia abnormal rhythem no murmur RESP-CTAB EXT- No edema NEURO-CNII-XII in tact, no nystagmus, normal speech, Pulses- Radial, 2+   EKG- NSR, PVC , read poor r wave progression     Assessment & Plan:      Problem List Items Addressed This Visit    None    Visit Diagnoses    Cardiac arrhythmia, unspecified cardiac arrhythmia type    -  Primary   Pt with  bradycardia on BB and CCB but also with multiple PVC, question if this is contributing to dizzy spells as well. Will have his cardiologist evaluate him. His BP was mildly elevated from his baseline today,. But Bradycardia is normal variant for him So I will hold on decreasing meds    Relevant Orders   EKG 12-Lead (Completed)   Acute maxillary sinusitis, recurrence not specified       Treat wtih zpak, atrovent/nasal saline, zyrtec   Relevant Medications   azithromycin (ZITHROMAX) 250 MG tablet   cetirizine (ZYRTEC) 10 MG tablet   Dizzy spells       History of vertigp but also with PVC/bradycardia and sinus infection. Meclizine on hand, per above for other treatments       Note: This dictation was prepared with Dragon dictation along with smaller phrase technology. Any transcriptional errors that result from this process are unintentional.

## 2020-03-19 ENCOUNTER — Telehealth: Payer: Self-pay | Admitting: Cardiology

## 2020-03-19 ENCOUNTER — Encounter: Payer: Self-pay | Admitting: Family Medicine

## 2020-03-19 NOTE — Telephone Encounter (Signed)
   Patient c/o Palpitations:  High priority if patient c/o lightheadedness, shortness of breath, or chest pain  1) How long have you had palpitations/irregular HR/ Afib? Are you having the symptoms now?  Pt had EKG done at his PCP office yesterday. EKG showed bradycardia and multiple PVCs.   2) Are you currently experiencing lightheadedness, SOB or CP? Pt was lightheaded in office but thought it was part of his sinusitis, which was the reason why the pt made the appt with his PCP  3) Do you have a history of afib (atrial fibrillation) or irregular heart rhythm? PCP not sure   4) Have you checked your BP or HR? (document readings if available): 158/88  HR 58  5) Are you experiencing any other symptoms? Sinus pressure, dizziness, post nasal drip, ear pressure  Pt's PCP (Dr. Buelah Manis) office called. Pt made appt to see his PCP for sinus symptoms. When the pt was in the office the there was an EKG done. The PCP wanted to have the pt seen by Dr. Stanford Breed or an APP sooner, but the first available appointment was 06/23. Please call patient to discuss sx

## 2020-03-19 NOTE — Telephone Encounter (Signed)
Patient coming tomorrow morning at 8:45 am to see Doreene Burke PA He is aware of date, time, and location

## 2020-03-20 ENCOUNTER — Encounter: Payer: Self-pay | Admitting: Cardiology

## 2020-03-20 ENCOUNTER — Ambulatory Visit (INDEPENDENT_AMBULATORY_CARE_PROVIDER_SITE_OTHER): Payer: Medicare Other | Admitting: Cardiology

## 2020-03-20 ENCOUNTER — Other Ambulatory Visit: Payer: Self-pay

## 2020-03-20 VITALS — BP 162/88 | HR 75 | Ht 69.0 in | Wt 172.0 lb

## 2020-03-20 DIAGNOSIS — E7800 Pure hypercholesterolemia, unspecified: Secondary | ICD-10-CM

## 2020-03-20 DIAGNOSIS — I10 Essential (primary) hypertension: Secondary | ICD-10-CM | POA: Diagnosis not present

## 2020-03-20 DIAGNOSIS — J439 Emphysema, unspecified: Secondary | ICD-10-CM

## 2020-03-20 DIAGNOSIS — I350 Nonrheumatic aortic (valve) stenosis: Secondary | ICD-10-CM

## 2020-03-20 DIAGNOSIS — I493 Ventricular premature depolarization: Secondary | ICD-10-CM | POA: Diagnosis not present

## 2020-03-20 DIAGNOSIS — I6523 Occlusion and stenosis of bilateral carotid arteries: Secondary | ICD-10-CM

## 2020-03-20 DIAGNOSIS — I714 Abdominal aortic aneurysm, without rupture, unspecified: Secondary | ICD-10-CM

## 2020-03-20 DIAGNOSIS — F17200 Nicotine dependence, unspecified, uncomplicated: Secondary | ICD-10-CM | POA: Diagnosis not present

## 2020-03-20 DIAGNOSIS — J302 Other seasonal allergic rhinitis: Secondary | ICD-10-CM | POA: Diagnosis not present

## 2020-03-20 DIAGNOSIS — J3089 Other allergic rhinitis: Secondary | ICD-10-CM | POA: Diagnosis not present

## 2020-03-20 DIAGNOSIS — I2510 Atherosclerotic heart disease of native coronary artery without angina pectoris: Secondary | ICD-10-CM | POA: Diagnosis not present

## 2020-03-20 DIAGNOSIS — E119 Type 2 diabetes mellitus without complications: Secondary | ICD-10-CM | POA: Diagnosis not present

## 2020-03-20 DIAGNOSIS — J449 Chronic obstructive pulmonary disease, unspecified: Secondary | ICD-10-CM | POA: Insufficient documentation

## 2020-03-20 NOTE — Assessment & Plan Note (Signed)
Moderate carotid stenosis by doppler March 2020

## 2020-03-20 NOTE — Assessment & Plan Note (Signed)
Referred from PCP with frequent PVCs and h/o dizziness

## 2020-03-20 NOTE — Assessment & Plan Note (Signed)
S/P AO stent graft [id number] complicated by peripheral emboli resulting in amputation of toe. Encouraged to make an apt with Dr Early for follow up

## 2020-03-20 NOTE — Assessment & Plan Note (Signed)
Pt says he has seasonal allergies and sinusitis that causes dizziness

## 2020-03-20 NOTE — Assessment & Plan Note (Signed)
COPD- smoker

## 2020-03-20 NOTE — Patient Instructions (Signed)
Medication Instructions:  Your physician recommends that you continue on your current medications as directed. Please refer to the Current Medication list given to you today.  *If you need a refill on your cardiac medications before your next appointment, please call your pharmacy*   Lab Work: None ordered   If you have labs (blood work) drawn today and your tests are completely normal, you will receive your results only by: Marland Kitchen MyChart Message (if you have MyChart) OR . A paper copy in the mail If you have any lab test that is abnormal or we need to change your treatment, we will call you to review the results.   Testing/Procedures: A zio monitor was ordered today. It will remain on for 14 days. You will then return monitor and event diary in provided box. It takes 1-2 weeks for report to be downloaded and returned to [place]. We will call you with the results. If monitor falls off or has orange flashing light, please call Zio for further instructions.   Your physician has requested that you have an echocardiogram. Echocardiography is a painless test that uses sound waves to create images of your heart. It provides your doctor with information about the size and shape of your heart and how well your heart's chambers and valves are working. This procedure takes approximately one hour. There are no restrictions for this procedure.     Follow-Up: At [facility], you and your health needs are our priority.  As part of our continuing mission to provide you with exceptional heart care, we have created designated Provider Care Teams.  These Care Teams include your primary Cardiologist (physician) and Advanced Practice Providers (APPs -  Physician Assistants and Nurse Practitioners) who all work together to provide you with the care you need, when you need it.  We recommend signing up for the patient portal called "MyChart".  Sign up information is provided on this After Visit Summary.  MyChart is  used to connect with patients for Virtual Visits (Telemedicine).  Patients are able to view lab/test results, encounter notes, upcoming appointments, etc.  Non-urgent messages can be sent to your provider as well.   To learn more about what you can do with MyChart, go to [email or web address].    Your next appointment:   6 month(s)  The format for your next appointment:   In Person  Provider:   You may see Kirk Ruths, MD or one of the following Advanced Practice Providers on your designated Care Team:    Kerin Ransom, PA-C  [place], [place]  Coletta Memos, [place]    Other Instructions Please call Dr. Donnetta Hutching and schedule a appointment.

## 2020-03-20 NOTE — Progress Notes (Signed)
Cardiology Office Note:    Date:  03/20/2020   ID:  Barry [name], DOB 06/02/1933, MRN [id number]  PCP:  Alycia Rossetti, MD  Cardiologist:  No primary care provider on file.  Electrophysiologist:  None   Referring MD: Alycia Rossetti, MD   CC: referred from PCP for PVCs-dizziness  History of Present Illness:    Barry [name] is a 86 y.o. male with a hx of PVD, status post aortic stent graft in 2009.  This was complicated by peripheral emboli resulting in amputation of one of his toes.  He also has a history of coronary disease, at catheterization in 2005 he had a 90% RV branch and otherwise nonobstructive coronary disease.  He is not had an MI or further cardiac testing.  In addition to the above he has COPD and history of smoking, hypertension, non-insulin-dependent diabetes, and treated dyslipidemia.  He is referred to the office today by his PCP.  He went to his PCP for a routine checkup and to be evaluated for sinusitis.  He says he has seasonal allergies and gets sinusitis every year.  When he gets his sinusitis he gets dizziness associated with it.  In the office he had an irregular heart rhythm and an EKG showed sinus rhythm with PVCs.  The patient denies any unusual chest pain.  He denies any tachycardia or palpitations.  He has not had syncope or near syncope.  He did tell me that he has had "vertigo all my life".  In the office on exam he appears to be in trigeminy.  Past Medical History:  Diagnosis Date  . AAA (abdominal aortic aneurysm) (HCC) 3-09   4.7 cm  . Allergy    rhinitis  . Anxiety   . Cancer ([place])    skin  . Cataract   . Depression   . Diabetes mellitus   . Diverticulosis   . GERD (gastroesophageal reflux disease)   . Hepatitis   . Hx of colonic polyps 08/26/2015  . Hyperlipidemia   . Hypertension   . Nephrolithiasis   . Personal history of colonic polyps    adenomas, serrated also  . PVD (peripheral vascular disease) ([place])   . Tobacco abuse      Past Surgical History:  Procedure Laterality Date  . COLONOSCOPY  2006, 2008, 2010, 05/27/2011   numerous adenomas - 13 in 2006, 3, 2008, 2 2012 (up to 1 cm), 4 diminutive adenomas, serrated adenomas 2012. Diverticulosis and hemorrhoids also.  Marland Kitchen EYE SURGERY    . EYE SURGERY Left   . Kidney stone operation    . lower limb amputation, other toe 5th    . percutaneous stent graft repair of infrarenal AAA  11/10   5.6 cm (T. early)  . surgical excision basal cell carcinoma    . tympanic eardrum repair    . VASECTOMY      Current Medications: Current Meds  Medication Sig  . aspirin EC 81 MG tablet Take 81 mg by mouth every morning.  Marland Kitchen azithromycin (ZITHROMAX) 250 MG tablet Take 2 tablets x 1 day then 1 tablet daily x 4 days  . cetirizine (ZYRTEC) 10 MG tablet Take 1 tablet (10 mg total) by mouth daily.  . clobetasol (TEMOVATE) 0.05 % external solution Apply 1 application topically daily as needed (dry scalp).  . clonazePAM (KLONOPIN) 0.5 MG tablet TAKE 1 TABLET BY MOUTH 3 TIMES DAILY AS NEEDED FOR ANXIETY.  Marland Kitchen ezetimibe (ZETIA) 10 MG tablet Take 1 tablet (  10 mg total) by mouth daily. NEED APPOINTMENT FOR FUTURE REFILL.  . famotidine (PEPCID) 10 MG tablet Take 10 mg by mouth as needed for heartburn or indigestion.  Marland Kitchen FLUoxetine (PROZAC) 20 MG capsule TAKE (1) CAPSULE BY MOUTH EACH MORNING.  Marland Kitchen glimepiride (AMARYL) 1 MG tablet TAKE 1 TABLET BY MOUTH TWICE DAILY  . hydrochlorothiazide (HYDRODIURIL) 25 MG tablet Take 1 tablet (25 mg total) by mouth daily.  Marland Kitchen ibuprofen (ADVIL,MOTRIN) 200 MG tablet Take 400 mg by mouth every 4 (four) hours as needed for mild pain.  Marland Kitchen ipratropium (ATROVENT) 0.03 % nasal spray Place 2 sprays into both nostrils every 12 (twelve) hours.  . meclizine (ANTIVERT) 12.5 MG tablet Take 1 tablet (12.5 mg total) by mouth 2 (two) times daily as needed for dizziness.  . metoprolol succinate (TOPROL-XL) 25 MG 24 hr tablet TAKE ONE-HALF TABLET BY MOUTH DAILY. GENERIC EQUIVALENT  FOR TOPROL XL.  Marland Kitchen NIFEdipine (PROCARDIA-XL/NIFEDICAL-XL) 30 MG 24 hr tablet TAKE 1 TABLET BY MOUTH EVERY DAY  . polyethylene glycol (MIRALAX / GLYCOLAX) packet Take 17 g by mouth daily as needed for mild constipation.  . ramipril (ALTACE) 10 MG capsule Take 1 capsule (10 mg total) by mouth daily.  . sildenafil (REVATIO) 20 MG tablet Take 3 tablets 1 hour before intercourse  . simvastatin (ZOCOR) 40 MG tablet TAKE (1) TABLET BY MOUTH AT BEDTIME.  . vitamin B-12 (CYANOCOBALAMIN) 1000 MCG tablet Take 1,000 mcg by mouth daily.     Allergies:   Lisinopril-hydrochlorothiazide and Doxycycline   Social History   Socioeconomic History  . Marital status: Widowed    Spouse name: Not on file  . Number of children: 2  . Years of education: Not on file  . Highest education level: Not on file  Occupational History    Employer: RETIRED  Tobacco Use  . Smoking status: Current Every Day Smoker    Packs/day: 1.00    Years: 60.00    Pack years: 60.00    Types: Cigarettes  . Smokeless tobacco: Never Used  . Tobacco comment: smokes about 16-20 cigs a day as of 04/04/2013  Substance and Sexual Activity  . Alcohol use: No  . Drug use: No  . Sexual activity: Yes    Partners: Female    Birth control/protection: Surgical    Comment: vasectomy  Other Topics Concern  . 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 retired 57 years - keeping busy. Lives alone. 1- step-daughter, 1 son - '68; 3 grandchildren. does odd-jobs, yard work   Scientist, physiological Strain:   . Difficulty of Paying Living Expenses:   Food Insecurity:   . Worried About Charity fundraiser in the Last Year:   . Arboriculturist in the Last Year:   Transportation Needs:   . Film/video editor (Medical):   Marland Kitchen Lack of Transportation (Non-Medical):   Physical Activity:   . Days of Exercise per Week:   . Minutes of Exercise per Session:   Stress:   . Feeling  of Stress :   Social Connections:   . Frequency of Communication with Friends and Family:   . Frequency of Social Gatherings with Friends and Family:   . Attends Religious Services:   . Active Member of Clubs or Organizations:   . Attends Archivist Meetings:   Marland Kitchen Marital Status:      Family History: The patient's family history includes  Cancer in his brother and brother; Depression in his brother and sister; Diabetes in his sister; Emphysema in his father; Heart disease in his mother; Kidney disease in his sister; Learning disabilities in his sister; Lung disease in his sister; Mental retardation in his sister. There is no history of Colon cancer, Dementia, Alcohol abuse, Drug abuse, Paranoid behavior, Schizophrenia, Anxiety disorder, Bipolar disorder, OCD, Sexual abuse, Physical abuse, Seizures, Esophageal cancer, Stomach cancer, or Rectal cancer.  ROS:   Please see the history of present illness.     All other systems reviewed and are negative.  EKGs/Labs/Other Studies Reviewed:    The following studies were reviewed today: Carotid dopplers 12/18/2018- IMPRESSION: Moderate amount of bilateral atherosclerotic plaque, right greater than left, progressed compared to the 2006 examination, though not resulting in a hemodynamically significant stenosis within either internal carotid artery.  EKG:  EKG is not ordered today.  The ekg ordered 03/18/2020 demonstrates NSR- trigeminy   Recent Labs: 02/21/2020: ALT 14; BUN 18; Creat 1.03; Hemoglobin 15.5; Platelets 231; Potassium 4.2; Sodium 141  Recent Lipid Panel    Component Value Date/Time   CHOL 122 02/21/2020 0813   TRIG 66 02/21/2020 0813   HDL 47 02/21/2020 0813   CHOLHDL 2.6 02/21/2020 0813   VLDL 17 05/26/2017 0801   LDLCALC 60 02/21/2020 0813   LDLDIRECT 152.6 05/28/2008 1333    Physical Exam:    VS:  BP (!) 162/88   Pulse 75   Ht 5\' 9"  (1.753 m)   Wt 172 lb (78 kg)   SpO2 97%   BMI 25.40 kg/m     Wt  Readings from Last 3 Encounters:  03/20/20 172 lb (78 kg)  03/18/20 175 lb (79.4 kg)  02/22/20 178 lb (80.7 kg)     GEN:  Well nourished, well developed in no acute distress HEENT: Normal NECK: No JVD; bilateral carotid bruits CARDIAC: RRR, 2/6 systolic murmur AOV, soft S2,  rubs, gallops RESPIRATORY:  Decreased breath sounds c/w COPD- no wheezing ABDOMEN: Soft, non-tender, non-distended MUSCULOSKELETAL:  No edema; No deformity  SKIN: Warm and dry NEUROLOGIC:  Alert and oriented x 3 PSYCHIATRIC:  Normal affect   ASSESSMENT:    Aortic stenosis AS on exam- check echo  Frequent PVCs Referred from PCP with frequent PVCs and h/o dizziness   CAD (coronary artery disease) Cath '05- 80-90% small RV branch stenosis, non obstructive residual CAD. No history of angina  Carotid artery disease (HCC) Moderate carotid stenosis by doppler March 2020  AAA (abdominal aortic aneurysm) without rupture (HCC) S/P AO stent graft [id number] complicated by peripheral emboli resulting in amputation of toe. Encouraged to make an apt with Dr Early for follow up  COPD (chronic obstructive pulmonary disease) ([place]) COPD- smoker  Seasonal and perennial allergic rhinitis Pt says he has seasonal allergies and sinusitis that causes dizziness  PLAN:    Check 14 day ZIO for PVC frequency.  Check echo for AS-LVF.  F/U Dr Stanford Breed in Dec.  The patient was reminded to contact Dr Luther Parody office for follow up.    Medication Adjustments/Labs and Tests Ordered: Current medicines are reviewed at length with the patient today.  Concerns regarding medicines are outlined above.  No orders of the defined types were placed in this encounter.  No orders of the defined types were placed in this encounter.   Patient Instructions  Medication Instructions:  Your physician recommends that you continue on your current medications as directed. Please refer to the Current Medication list given to  you today.  *If you need a  refill on your cardiac medications before your next appointment, please call your pharmacy*   Lab Work: None ordered   If you have labs (blood work) drawn today and your tests are completely normal, you will receive your results only by: Marland Kitchen MyChart Message (if you have MyChart) OR . A paper copy in the mail If you have any lab test that is abnormal or we need to change your treatment, we will call you to review the results.   Testing/Procedures: A zio monitor was ordered today. It will remain on for 14 days. You will then return monitor and event diary in provided box. It takes 1-2 weeks for report to be downloaded and returned to [place]. We will call you with the results. If monitor falls off or has orange flashing light, please call Zio for further instructions.   Your physician has requested that you have an echocardiogram. Echocardiography is a painless test that uses sound waves to create images of your heart. It provides your doctor with information about the size and shape of your heart and how well your heart's chambers and valves are working. This procedure takes approximately one hour. There are no restrictions for this procedure.     Follow-Up: At [facility], you and your health needs are our priority.  As part of our continuing mission to provide you with exceptional heart care, we have created designated Provider Care Teams.  These Care Teams include your primary Cardiologist (physician) and Advanced Practice Providers (APPs -  Physician Assistants and Nurse Practitioners) who all work together to provide you with the care you need, when you need it.  We recommend signing up for the patient portal called "MyChart".  Sign up information is provided on this After Visit Summary.  MyChart is used to connect with patients for Virtual Visits (Telemedicine).  Patients are able to view lab/test results, encounter notes, upcoming appointments, etc.  Non-urgent messages can be sent to your  provider as well.   To learn more about what you can do with MyChart, go to [email or web address].    Your next appointment:   6 month(s)  The format for your next appointment:   In Person  Provider:   You may see Kirk Ruths, MD or one of the following Advanced Practice Providers on your designated Care Team:    Kerin Ransom, PA-C  [place], [place]  Coletta Memos, [place]    Other Instructions Please call Dr. Donnetta Hutching and schedule a appointment.      Signed, Kerin Ransom, PA-C  03/20/2020 9:12 AM    [place]

## 2020-03-20 NOTE — Assessment & Plan Note (Signed)
AS on exam- check echo

## 2020-03-20 NOTE — Assessment & Plan Note (Signed)
Cath '05- 80-90% small RV branch stenosis, non obstructive residual CAD. No history of angina

## 2020-03-21 ENCOUNTER — Telehealth: Payer: Self-pay | Admitting: Radiology

## 2020-03-21 ENCOUNTER — Other Ambulatory Visit: Payer: Self-pay | Admitting: Family Medicine

## 2020-03-21 NOTE — Telephone Encounter (Signed)
Enrolled patient for a 14 day Zio monitor to be mailed to patients home.  

## 2020-03-25 ENCOUNTER — Other Ambulatory Visit: Payer: Self-pay | Admitting: Family Medicine

## 2020-03-26 ENCOUNTER — Ambulatory Visit (INDEPENDENT_AMBULATORY_CARE_PROVIDER_SITE_OTHER): Payer: Medicare Other

## 2020-03-26 DIAGNOSIS — I493 Ventricular premature depolarization: Secondary | ICD-10-CM

## 2020-04-09 ENCOUNTER — Other Ambulatory Visit: Payer: Self-pay

## 2020-04-09 ENCOUNTER — Ambulatory Visit (HOSPITAL_COMMUNITY): Payer: Medicare Other | Attending: Internal Medicine

## 2020-04-09 DIAGNOSIS — I350 Nonrheumatic aortic (valve) stenosis: Secondary | ICD-10-CM | POA: Insufficient documentation

## 2020-04-15 ENCOUNTER — Other Ambulatory Visit: Payer: Self-pay | Admitting: Family Medicine

## 2020-04-18 ENCOUNTER — Telehealth: Payer: Self-pay | Admitting: Adult Health

## 2020-04-18 NOTE — Telephone Encounter (Signed)
Lendon Colonel, NP  04/11/2020 3:34 PM EDT     Managing Barry [name]'s box.   I have reviewed echocardiogram. Normal pumping function, with mild relaxation abnormality. Should keep BP under control.   KL   Patient called w/results

## 2020-04-18 NOTE — Telephone Encounter (Signed)
New message   Patient is returning call for echo results. Please call. 

## 2020-04-22 ENCOUNTER — Other Ambulatory Visit (HOSPITAL_COMMUNITY): Payer: Self-pay | Admitting: Psychiatry

## 2020-04-22 NOTE — Telephone Encounter (Signed)
Needs appt, not sen since 2020

## 2020-04-24 ENCOUNTER — Other Ambulatory Visit: Payer: Self-pay | Admitting: Family Medicine

## 2020-04-28 ENCOUNTER — Telehealth: Payer: Self-pay | Admitting: *Deleted

## 2020-04-28 ENCOUNTER — Other Ambulatory Visit: Payer: Self-pay

## 2020-04-28 ENCOUNTER — Telehealth (INDEPENDENT_AMBULATORY_CARE_PROVIDER_SITE_OTHER): Payer: Medicare Other | Admitting: Psychiatry

## 2020-04-28 ENCOUNTER — Encounter (HOSPITAL_COMMUNITY): Payer: Self-pay | Admitting: Psychiatry

## 2020-04-28 DIAGNOSIS — I6523 Occlusion and stenosis of bilateral carotid arteries: Secondary | ICD-10-CM | POA: Diagnosis not present

## 2020-04-28 DIAGNOSIS — F331 Major depressive disorder, recurrent, moderate: Secondary | ICD-10-CM | POA: Diagnosis not present

## 2020-04-28 MED ORDER — FLUOXETINE HCL 20 MG PO CAPS: ORAL_CAPSULE | ORAL | 2 refills | Status: DC

## 2020-04-28 MED ORDER — CLONAZEPAM 0.5 MG PO TABS: 0.2500 mg | ORAL_TABLET | Freq: Two times a day (BID) | ORAL | 2 refills | Status: DC

## 2020-04-28 MED ORDER — SILDENAFIL CITRATE 100 MG PO TABS: 50.0000 mg | ORAL_TABLET | Freq: Every day | ORAL | 1 refills | Status: DC | PRN

## 2020-04-28 NOTE — Telephone Encounter (Signed)
Call placed to patient and patient made aware.  

## 2020-04-28 NOTE — Progress Notes (Signed)
Virtual Visit via Telephone Note  I connected with Barry [name] on 04/28/20 at 11:20 AM EDT by telephone and verified that I am speaking with the correct person using two identifiers.   I discussed the limitations, risks, security and privacy concerns of performing an evaluation and management service by telephone and the availability of in person appointments. I also discussed with the patient that there may be a patient responsible charge related to this service. The patient expressed understanding and agreed to proceed:    I discussed the assessment and treatment plan with the patient. The patient was provided an opportunity to ask questions and all were answered. The patient agreed with the plan and demonstrated an understanding of the instructions.   The patient was advised to call back or seek an in-person evaluation if the symptoms worsen or if the condition fails to improve as anticipated.  I provided 15 minutes of non-face-to-face time during this encounter. Location: Provider office, patient home  [name], MD  [facility] MD/PA/NP OP Progress Note  04/28/2020 11:28 AM Barry [name]  MRN:  [id number]  Chief Complaint:  Chief Complaint    Anxiety; Follow-up     HPI: This patient is an 86 year old widowed white male lives alone in [place].  He has 2 children and 3 grandchildren.  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  The patient returns for follow-up after long absence.  He was last seen about 18 months ago.  His primary doctor has been managing his medication.  He has concerns about his clonazepam.  She has cut it down to 0.25 mg 3 times daily.  He still has some dizziness and lightheadedness at times.  He does also have a lot of sinus problems.  He would like to get off the medication eventually and I suggested that we cut it down very slowly and start with 0.25 mg twice daily.  He denies significant anxiety or depression.  He has gotten  his coronavirus vaccine so he is able to visit with family and do social things.  He continues to mow his own lawn and do yard work.  He is sleeping and eating well.  He continues on Prozac which he thinks is really helped his mood. Visit Diagnosis:    ICD-10-CM   1. Major depressive disorder, recurrent episode, moderate (HCC)  F33.1     Past Psychiatric History: Long-term outpatient treatment for depression and anxiety  Past Medical History:  Past Medical History:  Diagnosis Date  . AAA (abdominal aortic aneurysm) (HCC) 3-09   4.7 cm  . Allergy    rhinitis  . Anxiety   . Cancer ([place])    skin  . Cataract   . Depression   . Diabetes mellitus   . Diverticulosis   . GERD (gastroesophageal reflux disease)   . Hepatitis   . Hx of colonic polyps 08/26/2015  . Hyperlipidemia   . Hypertension   . Nephrolithiasis   . Personal history of colonic polyps    adenomas, serrated also  . PVD (peripheral vascular disease) ([place])   . Tobacco abuse     Past Surgical History:  Procedure Laterality Date  . COLONOSCOPY  2006, 2008, 2010, 05/27/2011   numerous adenomas - 13 in 2006, 3, 2008, 2 2012 (up to 1 cm), 4 diminutive adenomas, serrated adenomas 2012. Diverticulosis and hemorrhoids also.  Marland Kitchen EYE SURGERY    . EYE SURGERY Left   . Kidney stone operation    .  lower limb amputation, other toe 5th    . percutaneous stent graft repair of infrarenal AAA  11/10   5.6 cm (T. early)  . surgical excision basal cell carcinoma    . tympanic eardrum repair    . VASECTOMY      Family Psychiatric History: See below  Family History:  Family History  Problem Relation Age of Onset  . Emphysema Father   . Cancer Brother        Lung  . Depression Brother   . Depression Sister   . Lung disease Sister        Fibrosis and died from pneumonia on 04-01-13  . Kidney disease Sister   . Diabetes Sister   . Learning disabilities Sister   . Mental retardation Sister   . Heart disease Mother   . Cancer  Brother   . Colon cancer Neg Hx   . Dementia Neg Hx   . Alcohol abuse Neg Hx   . Drug abuse Neg Hx   . Paranoid behavior Neg Hx   . Schizophrenia Neg Hx   . Anxiety disorder Neg Hx   . Bipolar disorder Neg Hx   . OCD Neg Hx   . Sexual abuse Neg Hx   . Physical abuse Neg Hx   . Seizures Neg Hx   . Esophageal cancer Neg Hx   . Stomach cancer Neg Hx   . Rectal cancer Neg Hx     Social History:  Social History   Socioeconomic History  . Marital status: Widowed    Spouse name: Not on file  . Number of children: 2  . Years of education: Not on file  . Highest education level: Not on file  Occupational History    Employer: RETIRED  Tobacco Use  . Smoking status: Current Every Day Smoker    Packs/day: 1.00    Years: 60.00    Pack years: 60.00    Types: Cigarettes  . Smokeless tobacco: Never Used  . Tobacco comment: smokes about 16-20 cigs a day as of 04/04/2013  Vaping Use  . Vaping Use: Never used  Substance and Sexual Activity  . Alcohol use: No  . Drug use: No  . Sexual activity: Yes    Partners: Female    Birth control/protection: Surgical    Comment: vasectomy  Other Topics Concern  . 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 retired 86 years - keeping busy. Lives alone. 1- step-daughter, 1 son - '68; 3 grandchildren. does odd-jobs, yard work   Scientist, physiological Strain:   . Difficulty of Paying Living Expenses:   Food Insecurity:   . Worried About Charity fundraiser in the Last Year:   . Arboriculturist in the Last Year:   Transportation Needs:   . Film/video editor (Medical):   Marland Kitchen Lack of Transportation (Non-Medical):   Physical Activity:   . Days of Exercise per Week:   . Minutes of Exercise per Session:   Stress:   . Feeling of Stress :   Social Connections:   . Frequency of Communication with Friends and Family:   . Frequency of Social Gatherings with Friends and Family:    . Attends Religious Services:   . Active Member of Clubs or Organizations:   . Attends Archivist Meetings:   Marland Kitchen Marital Status:     Allergies:  Allergies  Allergen Reactions  .  Lisinopril-Hydrochlorothiazide Swelling    ZESTRIL  . Doxycycline Rash    Metabolic Disorder Labs: Lab Results  Component Value Date   HGBA1C 6.4 (H) 02/21/2020   MPG 137 02/21/2020   MPG 146 04/27/2019   No results found for: PROLACTIN Lab Results  Component Value Date   CHOL 122 02/21/2020   TRIG 66 02/21/2020   HDL 47 02/21/2020   CHOLHDL 2.6 02/21/2020   VLDL 17 05/26/2017   LDLCALC 60 02/21/2020   LDLCALC 57 04/27/2019   Lab Results  Component Value Date   TSH 1.266 12/21/2018   TSH 2.071 10/27/2018    Therapeutic Level Labs: No results found for: LITHIUM No results found for: VALPROATE No components found for:  CBMZ  Current Medications: Current Outpatient Medications  Medication Sig Dispense Refill  . aspirin EC 81 MG tablet Take 81 mg by mouth every morning.    Marland Kitchen azithromycin (ZITHROMAX) 250 MG tablet Take 2 tablets x 1 day then 1 tablet daily x 4 days 6 tablet 0  . cetirizine (ZYRTEC) 10 MG tablet Take 1 tablet (10 mg total) by mouth daily. 30 tablet 2  . clobetasol (TEMOVATE) 0.05 % external solution Apply 1 application topically daily as needed (dry scalp).    . clonazePAM (KLONOPIN) 0.5 MG tablet Take 0.5 tablets (0.25 mg total) by mouth 2 (two) times daily. 60 tablet 2  . ezetimibe (ZETIA) 10 MG tablet Take 1 tablet (10 mg total) by mouth daily. NEED APPOINTMENT FOR FUTURE REFILL. 90 tablet 3  . famotidine (PEPCID) 10 MG tablet Take 10 mg by mouth as needed for heartburn or indigestion.    Marland Kitchen FLUoxetine (PROZAC) 20 MG capsule TAKE (1) CAPSULE BY MOUTH EACH MORNING. 90 capsule 2  . glimepiride (AMARYL) 1 MG tablet TAKE 1 TABLET BY MOUTH TWICE DAILY 180 tablet 3  . hydrochlorothiazide (HYDRODIURIL) 25 MG tablet TAKE 1 TABLET BY MOUTH DAILY 90 tablet 1  . ibuprofen  (ADVIL,MOTRIN) 200 MG tablet Take 400 mg by mouth every 4 (four) hours as needed for mild pain.    Marland Kitchen ipratropium (ATROVENT) 0.03 % nasal spray Place 2 sprays into both nostrils every 12 (twelve) hours. 30 mL 12  . metoprolol succinate (TOPROL-XL) 25 MG 24 hr tablet TAKE ONE-HALF TABLET BY MOUTH DAILY. GENERIC EQUIVALENT FOR TOPROL XL. 45 tablet 3  . NIFEdipine (PROCARDIA-XL/NIFEDICAL-XL) 30 MG 24 hr tablet TAKE 1 TABLET BY MOUTH EVERY DAY 90 tablet 0  . polyethylene glycol (MIRALAX / GLYCOLAX) packet Take 17 g by mouth daily as needed for mild constipation.    . ramipril (ALTACE) 10 MG capsule Take 1 capsule (10 mg total) by mouth daily. 90 capsule 3  . sildenafil (REVATIO) 20 MG tablet Take 3 tablets 1 hour before intercourse 20 tablet 0  . simvastatin (ZOCOR) 40 MG tablet TAKE (1) TABLET BY MOUTH AT BEDTIME. 90 tablet 0  . vitamin B-12 (CYANOCOBALAMIN) 1000 MCG tablet Take 1,000 mcg by mouth daily.     No current facility-administered medications for this visit.     Musculoskeletal: Strength & Muscle Tone: within normal limits Gait & Station: normal Patient leans: N/A  Psychiatric Specialty Exam: Review of Systems  Neurological: Positive for light-headedness and headaches.  All other systems reviewed and are negative.   There were no vitals taken for this visit.There is no height or weight on file to calculate BMI.  General Appearance: NA  Eye Contact:  NA  Speech:  Clear and Coherent  Volume:  Normal  Mood:  Euthymic  Affect:  NA  Thought Process:  Goal Directed  Orientation:  Full (Time, Place, and Person)  Thought Content: Rumination   Suicidal Thoughts:  No  Homicidal Thoughts:  No  Memory:  Immediate;   Good Recent;   Good Remote;   Good  Judgement:  Good  Insight:  Fair  Psychomotor Activity:  Normal  Concentration:  Concentration: Good and Attention Span: Good  Recall:  Good  Fund of Knowledge: Good  Language: Good  Akathisia:  No  Handed:  Right  AIMS (if  indicated): not done  Assets:  Communication Skills Desire for Improvement Resilience Social Support Talents/Skills  ADL's:  Intact  Cognition: WNL  Sleep:  Good   Screenings: PHQ2-9     Office Visit from 02/22/2020 in [place] Office Visit from 04/30/2019 in [place] Office Visit from 12/05/2018 in [place] Office Visit from 11/28/2018 in [name] Visit from 11/15/2018 in [place]  PHQ-2 Total Score 0 0 0 0 0  PHQ-9 Total Score 0 -- -- -- --       Assessment and Plan: This patient is an 86 year old male with a history of depression and anxiety.  He has tried various benzodiazepines and this 1 seems to be working the best but he still complains of lightheadedness.  It is unclear whether this is from the medication or due to sinus issues.  Nevertheless we will cut back the dosage to 0.25 mg twice daily.  He will continue Prozac 20 mg daily for depression.  He will return to see me in 4 months.   [name], MD 04/28/2020, 11:28 AM

## 2020-04-28 NOTE — Telephone Encounter (Signed)
Received call from patient.   Requested prescription for Sildenafil 100mg . Reports that he requires stronger dose than 20mg  (3) tabs PRN.  MD please advise.

## 2020-04-28 NOTE — Telephone Encounter (Signed)
He can try the 100mg  tablet He needs watch his blood pressure, if he gets HA, dizziness/ chest pain, discontinue use

## 2020-04-29 DIAGNOSIS — I493 Ventricular premature depolarization: Secondary | ICD-10-CM | POA: Diagnosis not present

## 2020-05-02 ENCOUNTER — Telehealth: Payer: Self-pay | Admitting: Family Medicine

## 2020-05-02 NOTE — Progress Notes (Signed)
  Chronic Care Management   Outreach Note  05/02/2020 Name: Barry [name] MRN: [id number] DOB: Mar 06, 1933  Referred by: Alycia Rossetti, MD Reason for referral : No chief complaint on file.   An unsuccessful telephone outreach was attempted today. The patient was referred to the pharmacist for assistance with care management and care coordination.   Follow Up Plan:   [place]

## 2020-05-06 ENCOUNTER — Telehealth: Payer: Self-pay | Admitting: Family Medicine

## 2020-05-06 NOTE — Chronic Care Management (AMB) (Signed)
°  Chronic Care Management   Note  05/06/2020 Name: ALLISTER LESSLEY MRN: [id number] DOB: [birth year]  ADONI GREENOUGH is a 86 y.o. year old male who is a primary care patient of [place], [place] Nunnery, MD. I reached out to [name] by phone today in response to a referral sent by Mr. Prince Couey Lesage's PCP, Alycia Rossetti, MD.   Mr. Bishop was given information about Chronic Care Management services today including:  1. CCM service includes personalized support from designated clinical staff supervised by his physician, including individualized plan of care and coordination with other care providers 2. 24/7 contact phone numbers for assistance for urgent and routine care needs. 3. Service will only be billed when office clinical staff spend 20 minutes or more in a month to coordinate care. 4. Only one practitioner may furnish and bill the service in a calendar month. 5. The patient may stop CCM services at any time (effective at the end of the month) by phone call to the office staff.   Patient agreed to services and verbal consent obtained.   Follow up plan   Carley Perdue  [employer] Scheduler

## 2020-05-26 DIAGNOSIS — C44219 Basal cell carcinoma of skin of left ear and external auricular canal: Secondary | ICD-10-CM | POA: Diagnosis not present

## 2020-05-26 DIAGNOSIS — Z85828 Personal history of other malignant neoplasm of skin: Secondary | ICD-10-CM | POA: Diagnosis not present

## 2020-05-26 DIAGNOSIS — L570 Actinic keratosis: Secondary | ICD-10-CM | POA: Diagnosis not present

## 2020-05-26 DIAGNOSIS — X32XXXD Exposure to sunlight, subsequent encounter: Secondary | ICD-10-CM | POA: Diagnosis not present

## 2020-05-26 DIAGNOSIS — Z08 Encounter for follow-up examination after completed treatment for malignant neoplasm: Secondary | ICD-10-CM | POA: Diagnosis not present

## 2020-05-26 DIAGNOSIS — C44622 Squamous cell carcinoma of skin of right upper limb, including shoulder: Secondary | ICD-10-CM | POA: Diagnosis not present

## 2020-05-30 DIAGNOSIS — Z961 Presence of intraocular lens: Secondary | ICD-10-CM | POA: Diagnosis not present

## 2020-05-30 DIAGNOSIS — H25011 Cortical age-related cataract, right eye: Secondary | ICD-10-CM | POA: Diagnosis not present

## 2020-05-30 DIAGNOSIS — H2511 Age-related nuclear cataract, right eye: Secondary | ICD-10-CM | POA: Diagnosis not present

## 2020-05-30 DIAGNOSIS — H18413 Arcus senilis, bilateral: Secondary | ICD-10-CM | POA: Diagnosis not present

## 2020-05-30 DIAGNOSIS — H25041 Posterior subcapsular polar age-related cataract, right eye: Secondary | ICD-10-CM | POA: Diagnosis not present

## 2020-06-06 ENCOUNTER — Other Ambulatory Visit: Payer: Self-pay | Admitting: Family Medicine

## 2020-06-13 ENCOUNTER — Other Ambulatory Visit: Payer: Self-pay | Admitting: Family Medicine

## 2020-06-26 ENCOUNTER — Other Ambulatory Visit: Payer: Self-pay | Admitting: *Deleted

## 2020-06-26 DIAGNOSIS — G4452 New daily persistent headache (NDPH): Secondary | ICD-10-CM

## 2020-06-26 DIAGNOSIS — R519 Headache, unspecified: Secondary | ICD-10-CM

## 2020-06-26 DIAGNOSIS — E876 Hypokalemia: Secondary | ICD-10-CM

## 2020-06-26 DIAGNOSIS — I10 Essential (primary) hypertension: Secondary | ICD-10-CM

## 2020-06-26 DIAGNOSIS — J0100 Acute maxillary sinusitis, unspecified: Secondary | ICD-10-CM

## 2020-06-26 DIAGNOSIS — I499 Cardiac arrhythmia, unspecified: Secondary | ICD-10-CM

## 2020-06-26 DIAGNOSIS — F341 Dysthymic disorder: Secondary | ICD-10-CM

## 2020-06-26 DIAGNOSIS — I714 Abdominal aortic aneurysm, without rupture, unspecified: Secondary | ICD-10-CM

## 2020-06-26 DIAGNOSIS — E7800 Pure hypercholesterolemia, unspecified: Secondary | ICD-10-CM

## 2020-06-26 DIAGNOSIS — E119 Type 2 diabetes mellitus without complications: Secondary | ICD-10-CM

## 2020-06-26 DIAGNOSIS — E441 Mild protein-calorie malnutrition: Secondary | ICD-10-CM

## 2020-06-26 DIAGNOSIS — F418 Other specified anxiety disorders: Secondary | ICD-10-CM

## 2020-06-26 DIAGNOSIS — I6521 Occlusion and stenosis of right carotid artery: Secondary | ICD-10-CM

## 2020-06-26 DIAGNOSIS — N528 Other male erectile dysfunction: Secondary | ICD-10-CM

## 2020-06-26 DIAGNOSIS — I2583 Coronary atherosclerosis due to lipid rich plaque: Secondary | ICD-10-CM

## 2020-07-01 ENCOUNTER — Telehealth: Payer: Self-pay | Admitting: Pharmacist

## 2020-07-01 NOTE — Progress Notes (Signed)
Chronic Care Management Pharmacy Assistant   Name: Barry [name]  MRN: [id number] DOB: September 12, 1933  Reason for Encounter: Medication Review  Patient Questions:  1.  Have you seen any other providers since your last visit? No  2.  Any changes in your medicines or health? No   Barry [name],  86 y.o. , male presents for their Initial CCM visit with the clinical pharmacist via telephone.  PCP : Barry Rossetti, MD  Allergies:   Allergies  Allergen Reactions  . Lisinopril-Hydrochlorothiazide Swelling    ZESTRIL  . Doxycycline Rash    Medications: Outpatient Encounter Medications as of 07/01/2020  Medication Sig  . aspirin EC 81 MG tablet Take 81 mg by mouth every morning.  Marland Kitchen azithromycin (ZITHROMAX) 250 MG tablet Take 2 tablets x 1 day then 1 tablet daily x 4 days  . cetirizine (ZYRTEC) 10 MG tablet Take 1 tablet (10 mg total) by mouth daily.  . clobetasol (TEMOVATE) 0.05 % external solution Apply 1 application topically daily as needed (dry scalp).  . clonazePAM (KLONOPIN) 0.5 MG tablet Take 0.5 tablets (0.25 mg total) by mouth 2 (two) times daily.  Marland Kitchen ezetimibe (ZETIA) 10 MG tablet Take 1 tablet (10 mg total) by mouth daily. NEED APPOINTMENT FOR FUTURE REFILL.  . famotidine (PEPCID) 10 MG tablet Take 10 mg by mouth as needed for heartburn or indigestion.  Marland Kitchen FLUoxetine (PROZAC) 20 MG capsule TAKE (1) CAPSULE BY MOUTH EACH MORNING.  Marland Kitchen glimepiride (AMARYL) 1 MG tablet TAKE 1 TABLET BY MOUTH TWICE DAILY  . hydrochlorothiazide (HYDRODIURIL) 25 MG tablet TAKE 1 TABLET BY MOUTH DAILY  . ibuprofen (ADVIL,MOTRIN) 200 MG tablet Take 400 mg by mouth every 4 (four) hours as needed for mild pain.  Marland Kitchen ipratropium (ATROVENT) 0.03 % nasal spray Place 2 sprays into both nostrils every 12 (twelve) hours.  . metoprolol succinate (TOPROL-XL) 25 MG 24 hr tablet TAKE ONE-HALF TABLET BY MOUTH DAILY. GENERIC EQUIVALENT FOR TOPROL XL.  Marland Kitchen NIFEdipine (PROCARDIA-XL/NIFEDICAL-XL) 30 MG 24 hr  tablet TAKE 1 TABLET BY MOUTH EVERY DAY  . polyethylene glycol (MIRALAX / GLYCOLAX) packet Take 17 g by mouth daily as needed for mild constipation.  . ramipril (ALTACE) 10 MG capsule Take 1 capsule (10 mg total) by mouth daily.  . sildenafil (REVATIO) 20 MG tablet Take 3 tablets 1 hour before intercourse  . sildenafil (VIAGRA) 100 MG tablet TAKE 1/2 TO 1 TABLET BY MOUTH DAILY AS NEEDED FOR ERECTILE DYSFUNCTION  . simvastatin (ZOCOR) 40 MG tablet TAKE (1) TABLET BY MOUTH AT BEDTIME.  . vitamin B-12 (CYANOCOBALAMIN) 1000 MCG tablet Take 1,000 mcg by mouth daily.   No facility-administered encounter medications on file as of 07/01/2020.    Current Diagnosis: Patient Active Problem List   Diagnosis Date Noted  . Frequent PVCs 03/20/2020  . Aortic stenosis 03/20/2020  . COPD (chronic obstructive pulmonary disease) ([place]) 03/20/2020  . Constipation 04/30/2019  . Protein-calorie malnutrition ([place]) 04/30/2019  . Anxiety with depression 12/05/2018  . Seasonal and perennial allergic rhinitis 08/09/2018  . Carotid artery disease ([place]) 11/18/2017  . Hx of colonic polyps 08/26/2015  . Erectile dysfunction 02/17/2015  . AAA (abdominal aortic aneurysm) without rupture ([place]) 05/21/2014  . Chest pain 01/31/2014  . CAD (coronary artery disease) 12/24/2013  . HEARING LOSS, CONDUCTIVE, LEFT 02/23/2010  . DIZZINESS, CHRONIC 05/28/2008  . PULMONARY NODULE 09/07/2007  . Diabetes mellitus type II, controlled ([place]) 09/06/2007  . Hyperlipidemia 09/06/2007  . TOBACCO ABUSE 09/06/2007  .  Essential hypertension 09/06/2007  . ALLERGIC RHINITIS 09/06/2007  . GERD 09/06/2007  . BASAL CELL CARCINOMA, NOSE 09/06/2007    Goals Addressed   None     Follow-Up:  [facility]   Have you seen any other providers since your last visit? no Any changes in your medications or health? no Any side effects from any medications? Yes, previously that patient would like to discuss at  appt. Do you have an symptoms or problems not managed by your medications? Not currently. Previously. Any concerns about your health right now? No current concerns. Has your provider asked that you check blood pressure, blood sugar, or follow special diet at home? Yes, patient checks his blood sugars twice daily. Do you get any type of exercise on a regular basis? Yes, patient walks around his yard and mows the lawn often. Patient states he has a lot of acres of land so it takes him a while to mow his lawn. Can you think of a goal you would like to reach for your health? No current goal. Do you have any problems getting your medications? no Is there anything that you would like to discuss during the appointment? Yes, patient stated he would like to discuss changing his "nerve medication." He has had side effects from it in the past when he was on a higher dose.  During the call, patient had questions about when he could get a flu shot from his PCP office. Called PCP office. Patient can schedule an appt for flu vaccine starting 07/09/20 on Wednesdays or Thursdays from 8AM-11AM. Called pt back and informed pt of PCP flu vaccination schedule. Pt had good understanding and no further questions.  Please bring medications and supplements to appointment

## 2020-07-01 NOTE — Chronic Care Management (AMB) (Signed)
Chronic Care Management Pharmacy  Name: Barry [name]  MRN: [id number] DOB: [birth year]  Chief Complaint/ HPI  Barry [name],  86 y.o. , male presents for their Initial CCM visit with the clinical pharmacist via telephone.  PCP : Barry Rossetti, MD  Their chronic conditions include: HTN, CAD, GERD, Type II DM, HLD,  Depression/Anxiety.  Office Visits: 02/22/2020 [facility]) -   Was trying to reduce benzo to see if this improved balance issues during the day  No changes to BP medications at this time, already on multiple meds  03/18/2020 [facility]) - sinus pressure  Bradycardia, however this is somewhat normal for patient, referred to cardiology and held on decreasing meds at this point  Given zyrtec, ABX, and atrovent spray for sinus pressure    Medications: Outpatient Encounter Medications as of 07/03/2020  Medication Sig  . aspirin EC 81 MG tablet Take 81 mg by mouth every morning.  . clobetasol (TEMOVATE) 0.05 % external solution Apply 1 application topically daily as needed (dry scalp).  . clonazePAM (KLONOPIN) 0.5 MG tablet Take 0.5 tablets (0.25 mg total) by mouth 2 (two) times daily.  Marland Kitchen ezetimibe (ZETIA) 10 MG tablet Take 1 tablet (10 mg total) by mouth daily. NEED APPOINTMENT FOR FUTURE REFILL.  Marland Kitchen FLUoxetine (PROZAC) 20 MG capsule TAKE (1) CAPSULE BY MOUTH EACH MORNING.  Marland Kitchen glimepiride (AMARYL) 1 MG tablet TAKE 1 TABLET BY MOUTH TWICE DAILY  . hydrochlorothiazide (HYDRODIURIL) 25 MG tablet TAKE 1 TABLET BY MOUTH DAILY  . ibuprofen (ADVIL,MOTRIN) 200 MG tablet Take 400 mg by mouth every 4 (four) hours as needed for mild pain.  . metoprolol succinate (TOPROL-XL) 25 MG 24 hr tablet TAKE ONE-HALF TABLET BY MOUTH DAILY. GENERIC EQUIVALENT FOR TOPROL XL.  . montelukast (SINGULAIR) 10 MG tablet Take 10 mg by mouth at bedtime.  Marland Kitchen NIFEdipine (PROCARDIA-XL/NIFEDICAL-XL) 30 MG 24 hr tablet TAKE 1 TABLET BY MOUTH EVERY DAY  . ramipril (ALTACE) 10 MG capsule Take 1 capsule  (10 mg total) by mouth daily.  . sildenafil (REVATIO) 20 MG tablet Take 3 tablets 1 hour before intercourse  . sildenafil (VIAGRA) 100 MG tablet TAKE 1/2 TO 1 TABLET BY MOUTH DAILY AS NEEDED FOR ERECTILE DYSFUNCTION  . simvastatin (ZOCOR) 40 MG tablet TAKE (1) TABLET BY MOUTH AT BEDTIME.  . vitamin B-12 (CYANOCOBALAMIN) 1000 MCG tablet Take 1,000 mcg by mouth daily.  Marland Kitchen azithromycin (ZITHROMAX) 250 MG tablet Take 2 tablets x 1 day then 1 tablet daily x 4 days (Patient not taking: Reported on 07/03/2020)  . cetirizine (ZYRTEC) 10 MG tablet Take 1 tablet (10 mg total) by mouth daily. (Patient not taking: Reported on 07/03/2020)  . famotidine (PEPCID) 10 MG tablet Take 10 mg by mouth as needed for heartburn or indigestion. (Patient not taking: Reported on 07/03/2020)  . ipratropium (ATROVENT) 0.03 % nasal spray Place 2 sprays into both nostrils every 12 (twelve) hours. (Patient not taking: Reported on 07/03/2020)  . polyethylene glycol (MIRALAX / GLYCOLAX) packet Take 17 g by mouth daily as needed for mild constipation. (Patient not taking: Reported on 07/03/2020)   No facility-administered encounter medications on file as of 07/03/2020.     Current Diagnosis/Assessment:  Goals Addressed   None     Hypertension   BP goal is:  <130/80  Office blood pressures are  BP Readings from Last 3 Encounters:  03/20/20 (!) 162/88  03/18/20 (!) 158/88  02/22/20 (!) 148/78   Patient checks BP at home infrequently Patient home BP  readings are ranging: no logs available  Patient has failed these meds in the past: Lisinopril-HCTZ (swelling) Patient is currently uncontrolled on the following medications:  . HCTZ 70m . Metoprolol XL 219m. Nifedipine XL 3068m Ramipril 72m76matient's BP elevated.  Reports he has white coat syndrome which explains his in office numbers.  No longer checks at home because he would get himself worked up and increase his BP.  Denies swelling, reports some dizziness but he  attributes this do ongoing battle with vertigo.  No changes to BP meds at this time due to age and he is already on multiple meds.  Plan  Continue current medications    Diabetes   A1c goal <7%  Recent Relevant Labs: Lab Results  Component Value Date/Time   HGBA1C 6.4 (H) 02/21/2020 08:13 AM   HGBA1C 6.7 (H) 04/27/2019 08:24 AM   GFR 76.82 07/10/2013 12:13 PM   GFR 69.02 06/06/2012 10:45 AM   MICROALBUR 25.6 11/17/2017 08:38 AM   MICROALBUR 23.6 05/26/2017 08:01 AM    Last diabetic Eye exam:  Lab Results  Component Value Date/Time   HMDIABEYEEXA No Retinopathy 04/14/2017 12:00 AM    Last diabetic Foot exam:  Lab Results  Component Value Date/Time   HMDIABFOOTEX yes 04/29/2009 12:00 AM     Checking BG: 2x per Day  Recent FBG Readings: 100-120s   Patient has failed these meds in past: none noted Patient is currently controlled on the following medications: . Glimepiride 1mg 20m is checking glucose at least once sometimes twice daily.  Fasting sugars have been well controlled based on his report.  This is followed up by good A1c in May.  He reports a good diet and continues to work on keeping sugar at goal.  Discussed current goals for FBG.  Denies any episodes of hypoglycemia, counseled on importance of regular meals especially taking glimepiride to avoid and hypoglycemia.  Very active doing yard work for himself and multiple neighbors.  Plan  Continue current medications and control with diet and exercise Hyperlipidemia   LDL goal < 70  Lipid Panel     Component Value Date/Time   CHOL 122 02/21/2020 0813   TRIG 66 02/21/2020 0813   HDL 47 02/21/2020 0813   LDLCALC 60 02/21/2020 0813   LDLDIRECT 152.6 05/28/2008 1333    Hepatic Function Latest Ref Rng & Units 02/21/2020 04/27/2019 12/21/2018  Total Protein 6.1 - 8.1 g/dL 6.1 5.7(L) 6.3(L)  Albumin 3.5 - 5.0 g/dL - - 3.6  AST 10 - 35 U/L '18 13 19  ' ALT 9 - 46 U/L '14 9 14  ' Alk Phosphatase 38 - 126 U/L - - 49    Total Bilirubin 0.2 - 1.2 mg/dL 0.4 0.4 0.6  Bilirubin, Direct <=0.2 mg/dL - - -     The ASCVD Risk score (Goff Rut[place] al., 2013) failed to calculate for the following reasons:   The 2013 ASCVD risk score is only valid for ages 40 to649   75tient has failed these meds in past: none noted Patient is currently controlled on the following medications:  . Simvastatin 40mg 25metimibe 72mg  83mis WNL at last check.  Takes both medications appropriately and denies myalgias.  Plan  Continue current medications CAD   Patient has failed these meds in past: none noted Patient is currently controlled on the following medications:  . ASA 81mg  N88mnormal bruising or bleeding.  Controlling risk factors.  He denies chest  pain.  Plan  Continue current medications GERD   Patient has failed these meds in past: none noted Patient is currently controlled on the following medications:  none  He is no longer taking famotidine.  Denies symptoms of acid reflux.  Plan  Continue current medications Depression/Anxety   Patient has failed these meds in past: none noted Patient is currently controlled on the following medications:  . Fluoxetine 87m . Clonazepam 0.533mone-half tablet bid prn  States he is still experiencing dizziness with decreased dose of clonazepam.  But also stated he feels that way if he goes a few hours without taking it?. Marland KitchenUnsure if his dizziness is related to medication.  Encouraged him to continue decreased dose.  Reports nerves and depression are controlled with no symptoms. Plan  Continue current medications Vaccines   Reviewed and discussed patient's vaccination history.    Immunization History  Administered Date(s) Administered  . Fluad Quad(high Dose 65+) 07/12/2019  . H1N1 10/15/2008  . Influenza Split 07/12/2011, 08/18/2012  . Influenza Whole 09/25/2007, 08/23/2008, 08/25/2010  . Influenza, High Dose Seasonal PF 08/01/2013, 08/16/2014, 09/05/2018   . Influenza-Unspecified 08/05/2015, 08/28/2016, 09/02/2017  . Moderna SARS-COVID-2 Vaccination 11/19/2019, 12/17/2019  . Pneumococcal Conjugate-13 02/17/2015  . Td 06/06/2012    Plan  Recommended patient receive Shingrix vaccine in  office.  Medication Management   . Miscellaneous medications: o Sildenafil  . OTC's:  o Cetirizine 1080m IBU o Miralax . Patient currently uses AllHaematologistPhone #  [phone] reports using pill box method to organize medications and promote adherence. . Patient denies missed doses of medication.   ChrBeverly MilchharmD Clinical Pharmacist [phone]

## 2020-07-03 ENCOUNTER — Ambulatory Visit: Payer: Medicare Other | Admitting: Pharmacist

## 2020-07-03 DIAGNOSIS — E119 Type 2 diabetes mellitus without complications: Secondary | ICD-10-CM

## 2020-07-03 DIAGNOSIS — F418 Other specified anxiety disorders: Secondary | ICD-10-CM

## 2020-07-03 DIAGNOSIS — I10 Essential (primary) hypertension: Secondary | ICD-10-CM

## 2020-07-03 DIAGNOSIS — E7800 Pure hypercholesterolemia, unspecified: Secondary | ICD-10-CM

## 2020-07-03 NOTE — Patient Instructions (Addendum)
Visit Information Thank you for meeting with me today!  I look forward to working with you to help you meet all of your healthcare goals and answer any questions you may have.  Feel free to contact me anytime!  Goals Addressed            This Visit's Progress   . Pharmacy Care Plan:       CARE PLAN ENTRY (see longitudinal plan of care for additional care plan information)  Current Barriers:  . Chronic Disease Management support, education, and care coordination needs related to Hypertension, Hyperlipidemia, Diabetes, and Depression   Hypertension BP Readings from Last 3 Encounters:  03/20/20 (!) 162/88  03/18/20 (!) 158/88  02/22/20 (!) 148/78   . Pharmacist Clinical Goal(s): o Over the next 180 days, patient will work with PharmD and providers to achieve BP goal <130/80 . Current regimen:  . HCTZ 25mg  . Metoprolol XL 25mg  . Nifedipine XL 30mg  . Ramipril 10mg  . Interventions: o Reviewed home monitoring procedures o Discussed medication adherence . Patient self care activities - Over the next 180 days, patient will: o Check BP periodically, document, and provide at future appointments o Ensure daily salt intake < 2300 mg/day  Hyperlipidemia Lab Results  Component Value Date/Time   LDLCALC 60 02/21/2020 08:13 AM   LDLDIRECT 152.6 05/28/2008 01:33 PM   . Pharmacist Clinical Goal(s): o Over the next 180 days, patient will work with PharmD and providers to maintain LDL goal < 70 . Current regimen:  o Ezetimibe 10mg  o Simvastatin 40mg  . Interventions: o Reviewed most recent lipid panel o Discussed importance of statin medications . Patient self care activities - Over the next 180 days, patient will: o Continue to focus on medication adherence by pill box  Diabetes Lab Results  Component Value Date/Time   HGBA1C 6.4 (H) 02/21/2020 08:13 AM   HGBA1C 6.7 (H) 04/27/2019 08:24 AM   . Pharmacist Clinical Goal(s): o Over the next 180 days, patient will work with PharmD  and providers to maintain A1c goal <7% . Current regimen:  o Glimepiride 1mg  . Interventions: o Reviewed home blood sugar readings o Reviewed most recent A1c o Discussed importance of eating regular meals to avoid hypoglycemia . Patient self care activities - Over the next 180 days, patient will: o Check blood sugar once or twice daily, document, and provide at future appointments o Contact provider with any episodes of hypoglycemia o Continue to eat diet low in carbohydrates and sugars  Depression . Pharmacist Clinical Goal(s) o Over the next 180 days, patient will work with PharmD and providers to optimize medication and minimize symptoms of depression. . Current regimen:  o Fluoxetine 20mg  o Clonazepam 0.5mg  one-half tablet twice daily as needed . Interventions: o Discussed frequency of dizziness o Reviewed current depression symptoms. . Patient self care activities - Over the next 180 days, patient will: o Continue decreased dose of clonazepam o Contact providers with any sudden change in symptoms   Initial goal documentation        Barry [name] was given information about Chronic Care Management services today including:  1. CCM service includes personalized support from designated clinical staff supervised by his physician, including individualized plan of care and coordination with other care providers 2. 24/7 contact phone numbers for assistance for urgent and routine care needs. 3. Standard insurance, coinsurance, copays and deductibles apply for chronic care management only during months in which we provide at least 20 minutes of these services. Most insurances  cover these services at 100%, however patients may be responsible for any copay, coinsurance and/or deductible if applicable. This service may help you avoid the need for more expensive face-to-face services. 4. Only one practitioner may furnish and bill the service in a calendar month. 5. The patient may stop CCM  services at any time (effective at the end of the month) by phone call to the office staff.  Patient agreed to services and verbal consent obtained.   The patient verbalized understanding of instructions provided today and agreed to receive a mailed copy of patient instruction and/or educational materials. Telephone follow up appointment with pharmacy team member scheduled for:  Beverly Milch, PharmD Clinical Pharmacist [facility] [phone]   Hypertension, Adult Hypertension is another name for high blood pressure. High blood pressure forces your heart to work harder to pump blood. This can cause problems over time. There are two numbers in a blood pressure reading. There is a top number (systolic) over a bottom number (diastolic). It is best to have a blood pressure that is below 120/80. Healthy choices can help lower your blood pressure, or you may need medicine to help lower it. What are the causes? The cause of this condition is not known. Some conditions may be related to high blood pressure. What increases the risk?  Smoking.  Having type 2 diabetes mellitus, high cholesterol, or both.  Not getting enough exercise or physical activity.  Being overweight.  Having too much fat, sugar, calories, or salt (sodium) in your diet.  Drinking too much alcohol.  Having long-term (chronic) kidney disease.  Having a family history of high blood pressure.  Age. Risk increases with age.  Race. You may be at higher risk if you are African American.  Gender. Men are at higher risk than women before age 62. After age 36, women are at higher risk than men.  Having obstructive sleep apnea.  Stress. What are the signs or symptoms?  High blood pressure may not cause symptoms. Very high blood pressure (hypertensive crisis) may cause: ? Headache. ? Feelings of worry or nervousness (anxiety). ? Shortness of breath. ? Nosebleed. ? A feeling of being sick to  your stomach (nausea). ? Throwing up (vomiting). ? Changes in how you see. ? Very bad chest pain. ? Seizures. How is this treated?  This condition is treated by making healthy lifestyle changes, such as: ? Eating healthy foods. ? Exercising more. ? Drinking less alcohol.  Your health care provider may prescribe medicine if lifestyle changes are not enough to get your blood pressure under control, and if: ? Your top number is above 130. ? Your bottom number is above 80.  Your personal target blood pressure may vary. Follow these instructions at home: Eating and drinking   If told, follow the DASH eating plan. To follow this plan: ? Fill one half of your plate at each meal with fruits and vegetables. ? Fill one fourth of your plate at each meal with whole grains. Whole grains include whole-wheat pasta, brown rice, and whole-grain bread. ? Eat or drink low-fat dairy products, such as skim milk or low-fat yogurt. ? Fill one fourth of your plate at each meal with low-fat (lean) proteins. Low-fat proteins include fish, chicken without skin, eggs, beans, and tofu. ? Avoid fatty meat, cured and processed meat, or chicken with skin. ? Avoid pre-made or processed food.  Eat less than 1,500 mg of salt each day.  Do not drink alcohol if: ?  Your doctor tells you not to drink. ? You are pregnant, may be pregnant, or are planning to become pregnant.  If you drink alcohol: ? Limit how much you use to:  0-1 drink a day for women.  0-2 drinks a day for men. ? Be aware of how much alcohol is in your drink. In the [place] one drink equals one 12 oz bottle of beer (355 mL), one 5 oz glass of wine (148 mL), or one 1 oz glass of hard liquor (44 mL). Lifestyle   Work with your doctor to stay at a healthy weight or to lose weight. Ask your doctor what the best weight is for you.  Get at least 30 minutes of exercise most days of the week. This may include walking, swimming, or biking.  Get at  least 30 minutes of exercise that strengthens your muscles (resistance exercise) at least 3 days a week. This may include lifting weights or doing Pilates.  Do not use any products that contain nicotine or tobacco, such as cigarettes, e-cigarettes, and chewing tobacco. If you need help quitting, ask your doctor.  Check your blood pressure at home as told by your doctor.  Keep all follow-up visits as told by your doctor. This is important. Medicines  Take over-the-counter and prescription medicines only as told by your doctor. Follow directions carefully.  Do not skip doses of blood pressure medicine. The medicine does not work as well if you skip doses. Skipping doses also puts you at risk for problems.  Ask your doctor about side effects or reactions to medicines that you should watch for. Contact a doctor if you:  Think you are having a reaction to the medicine you are taking.  Have headaches that keep coming back (recurring).  Feel dizzy.  Have swelling in your ankles.  Have trouble with your vision. Get help right away if you:  Get a very bad headache.  Start to feel mixed up (confused).  Feel weak or numb.  Feel faint.  Have very bad pain in your: ? Chest. ? Belly (abdomen).  Throw up more than once.  Have trouble breathing. Summary  Hypertension is another name for high blood pressure.  High blood pressure forces your heart to work harder to pump blood.  For most people, a normal blood pressure is less than 120/80.  Making healthy choices can help lower blood pressure. If your blood pressure does not get lower with healthy choices, you may need to take medicine. This information is not intended to replace advice given to you by your health care provider. Make sure you discuss any questions you have with your health care provider. Document Revised: 06/14/2018 Document Reviewed: 06/14/2018 Elsevier Patient Education  2020 [employer].

## 2020-07-14 DIAGNOSIS — H2511 Age-related nuclear cataract, right eye: Secondary | ICD-10-CM | POA: Diagnosis not present

## 2020-07-15 ENCOUNTER — Other Ambulatory Visit: Payer: Self-pay | Admitting: Family Medicine

## 2020-07-17 ENCOUNTER — Ambulatory Visit: Payer: Self-pay | Admitting: Pharmacist

## 2020-07-17 NOTE — Chronic Care Management (AMB) (Signed)
Chronic Care Management   Follow Up Note   07/17/2020 Name: Barry [name] MRN: [id number] DOB: 13-Jul-1933  Referred by: Alycia Rossetti, MD Reason for referral : No chief complaint on file.   Barry [name] is a 86 y.o. year old male who is a primary care patient of [place], Modena Nunnery, MD. The CCM team was consulted for assistance with chronic disease management and care coordination needs.    Review of patient status, including review of consultants reports, relevant laboratory and other test results, and collaboration with appropriate care team members and the patient's provider was performed as part of comprehensive patient evaluation and provision of chronic care management services.    SDOH (Social Determinants of Health) assessments performed: No See Care Plan activities for detailed interventions related to [facility])     Outpatient Encounter Medications as of 07/17/2020  Medication Sig  . aspirin EC 81 MG tablet Take 81 mg by mouth every morning.  Marland Kitchen azithromycin (ZITHROMAX) 250 MG tablet Take 2 tablets x 1 day then 1 tablet daily x 4 days (Patient not taking: Reported on 07/03/2020)  . cetirizine (ZYRTEC) 10 MG tablet Take 1 tablet (10 mg total) by mouth daily. (Patient not taking: Reported on 07/03/2020)  . clobetasol (TEMOVATE) 0.05 % external solution Apply 1 application topically daily as needed (dry scalp).  . clonazePAM (KLONOPIN) 0.5 MG tablet Take 0.5 tablets (0.25 mg total) by mouth 2 (two) times daily.  Marland Kitchen ezetimibe (ZETIA) 10 MG tablet Take 1 tablet (10 mg total) by mouth daily. NEED APPOINTMENT FOR FUTURE REFILL.  . famotidine (PEPCID) 10 MG tablet Take 10 mg by mouth as needed for heartburn or indigestion. (Patient not taking: Reported on 07/03/2020)  . FLUoxetine (PROZAC) 20 MG capsule TAKE (1) CAPSULE BY MOUTH EACH MORNING.  Marland Kitchen glimepiride (AMARYL) 1 MG tablet TAKE 1 TABLET BY MOUTH TWICE DAILY  . hydrochlorothiazide (HYDRODIURIL) 25 MG tablet TAKE 1 TABLET BY MOUTH  DAILY  . ibuprofen (ADVIL,MOTRIN) 200 MG tablet Take 400 mg by mouth every 4 (four) hours as needed for mild pain.  Marland Kitchen ipratropium (ATROVENT) 0.03 % nasal spray Place 2 sprays into both nostrils every 12 (twelve) hours. (Patient not taking: Reported on 07/03/2020)  . metoprolol succinate (TOPROL-XL) 25 MG 24 hr tablet TAKE ONE-HALF TABLET BY MOUTH DAILY. GENERIC EQUIVALENT FOR TOPROL XL.  . montelukast (SINGULAIR) 10 MG tablet Take 10 mg by mouth at bedtime.  Marland Kitchen NIFEdipine (PROCARDIA-XL/NIFEDICAL-XL) 30 MG 24 hr tablet TAKE 1 TABLET BY MOUTH EVERY DAY  . polyethylene glycol (MIRALAX / GLYCOLAX) packet Take 17 g by mouth daily as needed for mild constipation. (Patient not taking: Reported on 07/03/2020)  . ramipril (ALTACE) 10 MG capsule TAKE ONE CAPSULE BY MOUTH EVERY DAY. GENERIC EQUIVALENT FOR ALTACE  . sildenafil (REVATIO) 20 MG tablet Take 3 tablets 1 hour before intercourse  . sildenafil (VIAGRA) 100 MG tablet TAKE 1/2 TO 1 TABLET BY MOUTH DAILY AS NEEDED FOR ERECTILE DYSFUNCTION  . simvastatin (ZOCOR) 40 MG tablet TAKE (1) TABLET BY MOUTH AT BEDTIME.  . vitamin B-12 (CYANOCOBALAMIN) 1000 MCG tablet Take 1,000 mcg by mouth daily.   No facility-administered encounter medications on file as of 07/17/2020.     Objective:  Analyzed care gaps and adherence date for STAR measures. Goals Addressed   None    Care Gaps  No AWV for 2021    Plan:   The patient has been provided with contact information for the care management team and has been  advised to call with any health related questions or concerns.    Beverly Milch, PharmD Clinical Pharmacist [facility] [phone]

## 2020-07-22 ENCOUNTER — Other Ambulatory Visit: Payer: Self-pay | Admitting: Family Medicine

## 2020-08-04 ENCOUNTER — Other Ambulatory Visit: Payer: Medicare Other

## 2020-08-04 ENCOUNTER — Other Ambulatory Visit: Payer: Self-pay

## 2020-08-04 DIAGNOSIS — Z20822 Contact with and (suspected) exposure to covid-19: Secondary | ICD-10-CM

## 2020-08-05 ENCOUNTER — Telehealth: Payer: Self-pay | Admitting: *Deleted

## 2020-08-05 LAB — NOVEL CORONAVIRUS, NAA: SARS-CoV-2, NAA: DETECTED — AB

## 2020-08-05 LAB — SPECIMEN STATUS REPORT

## 2020-08-05 LAB — SARS-COV-2, NAA 2 DAY TAT

## 2020-08-05 NOTE — Telephone Encounter (Signed)
Patient is COVID +.  Orders sent to [place].

## 2020-08-05 NOTE — Telephone Encounter (Signed)
Received call from patient. Reports that he has had COVID test on 08/04/2020.  Reports that Sx began 08/01/2020: fever (T-max 100.0), nonproductive cough, and slight SOB on exertion  Patient denied chest pain, loss of taste or smell, or body aches.   Patient reports that he is currently taking Robitussin and APAP. Advised to use nasal saline for nasal congestion. Advised that if SOB or chest pain noted, or fever that will not come down with APAP/IBU, go to ER.   Patient states that if test is positive, he would like [facility] In[facility].   MD to be made aware.

## 2020-08-05 NOTE — Telephone Encounter (Signed)
Noted  

## 2020-08-05 NOTE — Telephone Encounter (Signed)
Agree with infusion

## 2020-08-06 ENCOUNTER — Other Ambulatory Visit: Payer: Self-pay | Admitting: Adult Health

## 2020-08-06 ENCOUNTER — Ambulatory Visit (HOSPITAL_COMMUNITY)
Admission: RE | Admit: 2020-08-06 | Discharge: 2020-08-06 | Disposition: A | Discharge status: Home / Self Care | Payer: Medicare Other | Source: Ambulatory Visit | Attending: Pulmonary Disease | Admitting: Pulmonary Disease

## 2020-08-06 DIAGNOSIS — Z23 Encounter for immunization: Secondary | ICD-10-CM | POA: Insufficient documentation

## 2020-08-06 DIAGNOSIS — U071 COVID-19: Secondary | ICD-10-CM

## 2020-08-06 MED ORDER — METHYLPREDNISOLONE SODIUM SUCC 125 MG IJ SOLR: 125.0000 mg | Freq: Once | INTRAMUSCULAR | Status: DC | PRN

## 2020-08-06 MED ORDER — ALBUTEROL SULFATE HFA 108 (90 BASE) MCG/ACT IN AERS: 2.0000 | INHALATION_SPRAY | Freq: Once | RESPIRATORY_TRACT | Status: DC | PRN

## 2020-08-06 MED ORDER — EPINEPHRINE 0.3 MG/0.3ML IJ SOAJ: 0.3000 mg | Freq: Once | INTRAMUSCULAR | Status: DC | PRN

## 2020-08-06 MED ORDER — SODIUM CHLORIDE 0.9 % IV SOLN: Freq: Once | INTRAVENOUS | Status: AC

## 2020-08-06 MED ORDER — FAMOTIDINE IN NACL 20-0.9 MG/50ML-% IV SOLN: 20.0000 mg | Freq: Once | INTRAVENOUS | Status: DC | PRN

## 2020-08-06 MED ORDER — DIPHENHYDRAMINE HCL 50 MG/ML IJ SOLN: 50.0000 mg | Freq: Once | INTRAMUSCULAR | Status: DC | PRN

## 2020-08-06 MED ORDER — SODIUM CHLORIDE 0.9 % IV SOLN: INTRAVENOUS | Status: DC | PRN

## 2020-08-06 NOTE — Progress Notes (Signed)
I connected by phone with Barry [name] on 08/06/2020 at 9:39 AM to discuss the potential use of a new treatment for mild to moderate COVID-19 viral infection in non-hospitalized patients.  This patient is a 86 y.o. male that meets the FDA criteria for Emergency Use Authorization of COVID monoclonal antibody casirivimab/imdevimab or bamlanivimab/eteseviamb.  Has a (+) direct SARS-CoV-2 viral test result  Has mild or moderate COVID-19   Is NOT hospitalized due to COVID-19  Is within 10 days of symptom onset  Has at least one of the high risk factor(s) for progression to severe COVID-19 and/or hospitalization as defined in EUA.  Specific high risk criteria : Older age (>/= 84 yo)   I have spoken and communicated the following to the patient or parent/caregiver regarding COVID monoclonal antibody treatment:  1. FDA has authorized the emergency use for the treatment of mild to moderate COVID-19 in adults and pediatric patients with positive results of direct SARS-CoV-2 viral testing who are 71 years of age and older weighing at least 40 kg, and who are at high risk for progressing to severe COVID-19 and/or hospitalization.  2. The significant known and potential risks and benefits of COVID monoclonal antibody, and the extent to which such potential risks and benefits are unknown.  3. Information on available alternative treatments and the risks and benefits of those alternatives, including clinical trials.  4. Patients treated with COVID monoclonal antibody should continue to self-isolate and use infection control measures (e.g., wear mask, isolate, social distance, avoid sharing personal items, clean and disinfect "high touch" surfaces, and frequent handwashing) according to CDC guidelines.   5. The patient or parent/caregiver has the option to accept or refuse COVID monoclonal antibody treatment.  After reviewing this information with the patient, the patient has agreed to receive one  of the available covid 19 monoclonal antibodies and will be provided an appropriate fact sheet prior to infusion. Scot Dock, NP 08/06/2020 9:39 AM

## 2020-08-06 NOTE — Progress Notes (Signed)
  Diagnosis: COVID-19  Physician: Asencion Noble, MD  Procedure: [facility] Infusion [facility] Med: bamlanivimab\etesevimab infusion - Provided patient with bamlanimivab\etesevimab fact sheet for patients, parents and caregivers prior to infusion.  Complications: No immediate complications noted.  Discharge: Discharged home   Barry [name] 08/06/2020

## 2020-08-06 NOTE — Discharge Instructions (Signed)

## 2020-08-12 ENCOUNTER — Telehealth: Payer: Self-pay

## 2020-08-12 NOTE — Telephone Encounter (Signed)
PT LVM requesting a Z pack for sinus/cold symptoms that he is having.  Please Advise

## 2020-08-12 NOTE — Telephone Encounter (Signed)
Pt recently had Covid on 10/19, if he has difficulty breathing he needs to go to ER to be evaluated  Sinus symptoms are related to COVID, recommend nasal saline, flonase, claritin

## 2020-08-15 NOTE — Telephone Encounter (Signed)
Spoke with pt regarding cold symptoms.

## 2020-08-18 ENCOUNTER — Telehealth: Payer: Self-pay | Admitting: *Deleted

## 2020-08-18 NOTE — Telephone Encounter (Signed)
Received call from patient.   Inquired as to when he should get the Flu shot as he recently had COVID and [facility] Infusion.   MD please advise.

## 2020-08-19 ENCOUNTER — Other Ambulatory Visit: Payer: Medicare Other

## 2020-08-19 ENCOUNTER — Other Ambulatory Visit: Payer: Self-pay

## 2020-08-19 DIAGNOSIS — Z20822 Contact with and (suspected) exposure to covid-19: Secondary | ICD-10-CM

## 2020-08-19 NOTE — Telephone Encounter (Signed)
If it has been > 2 weeks since infusion he can get the flu shot

## 2020-08-19 NOTE — Telephone Encounter (Signed)
Left message for pt to call office.   York Cerise, CMA

## 2020-08-19 NOTE — Telephone Encounter (Signed)
Spoke with pt regarding get flu vac and he stated that he is going to have another test done this afternoon and he will cb to schedule appt

## 2020-08-20 LAB — SPECIMEN STATUS REPORT

## 2020-08-20 LAB — NOVEL CORONAVIRUS, NAA: SARS-CoV-2, NAA: NOT DETECTED

## 2020-08-20 LAB — SARS-COV-2, NAA 2 DAY TAT

## 2020-08-29 ENCOUNTER — Telehealth (INDEPENDENT_AMBULATORY_CARE_PROVIDER_SITE_OTHER): Payer: Medicare Other | Admitting: Psychiatry

## 2020-08-29 ENCOUNTER — Other Ambulatory Visit: Payer: Self-pay

## 2020-08-29 ENCOUNTER — Encounter (HOSPITAL_COMMUNITY): Payer: Self-pay | Admitting: Psychiatry

## 2020-08-29 DIAGNOSIS — F331 Major depressive disorder, recurrent, moderate: Secondary | ICD-10-CM | POA: Diagnosis not present

## 2020-08-29 DIAGNOSIS — I6523 Occlusion and stenosis of bilateral carotid arteries: Secondary | ICD-10-CM

## 2020-08-29 MED ORDER — CLONAZEPAM 0.5 MG PO TABS
0.2500 mg | ORAL_TABLET | Freq: Three times a day (TID) | ORAL | 3 refills | Status: DC
Start: 2020-08-29 — End: 2020-12-02

## 2020-08-29 MED ORDER — FLUOXETINE HCL 20 MG PO CAPS
ORAL_CAPSULE | ORAL | 2 refills | Status: DC
Start: 2020-08-29 — End: 2020-12-02

## 2020-08-29 NOTE — Progress Notes (Signed)
Virtual Visit via Telephone Note  I connected with Barry [name] on 08/29/20 at 11:00 AM EST by telephone and verified that I am speaking with the correct person using two identifiers.  Location: Patient: home Provider:home   I discussed the limitations, risks, security and privacy concerns of performing an evaluation and management service by telephone and the availability of in person appointments. I also discussed with the patient that there may be a patient responsible charge related to this service. The patient expressed understanding and agreed to proceed.    I discussed the assessment and treatment plan with the patient. The patient was provided an opportunity to ask questions and all were answered. The patient agreed with the plan and demonstrated an understanding of the instructions.   The patient was advised to call back or seek an in-person evaluation if the symptoms worsen or if the condition fails to improve as anticipated.  I provided 15 minutes of non-face-to-face time during this encounter.   [name], MD  [facility] MD/PA/NP OP Progress Note  08/29/2020 11:24 AM Barry [name]  MRN:  [id number]  Chief Complaint:  Chief Complaint    Anxiety; Follow-up     HPI: This patient is an 86 year old widowed white male lives alone in [place].  He has 2 children and 3 grandchildren.  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  The patient returns for follow-up after 4 months. He states that he started taking the 0.25 mg of clonazepam 3 times a recently. And he took it only twice a day he was having a hard time sleeping in the evening and also developed a headache. He states that his mood has been good and he denies any significant depression or anxiety symptoms. He does not need a higher quantity of the clonazepam sent in. Of note he developed coronavirus last month but was not too terribly sick. He had had the vaccinations back in the winter. He  did get the antibiotic infusion and it seemed to help. He is feeling well now. He denies suicidal ideation and sleeping and eating well. He continues on Prozac which is helped his mood Visit Diagnosis:    ICD-10-CM   1. Major depressive disorder, recurrent episode, moderate (HCC)  F33.1     Past Psychiatric History: Long-term outpatient treatment for depression and anxiety  Past Medical History:  Past Medical History:  Diagnosis Date  . AAA (abdominal aortic aneurysm) (HCC) 3-09   4.7 cm  . Allergy    rhinitis  . Anxiety   . Cancer ([place])    skin  . Cataract   . Depression   . Diabetes mellitus   . Diverticulosis   . GERD (gastroesophageal reflux disease)   . Hepatitis   . Hx of colonic polyps 08/26/2015  . Hyperlipidemia   . Hypertension   . Nephrolithiasis   . Personal history of colonic polyps    adenomas, serrated also  . PVD (peripheral vascular disease) ([place])   . Tobacco abuse     Past Surgical History:  Procedure Laterality Date  . COLONOSCOPY  2006, 2008, 2010, 05/27/2011   numerous adenomas - 13 in 2006, 3, 2008, 2 2012 (up to 1 cm), 4 diminutive adenomas, serrated adenomas 2012. Diverticulosis and hemorrhoids also.  Marland Kitchen EYE SURGERY    . EYE SURGERY Left   . Kidney stone operation    . lower limb amputation, other toe 5th    . percutaneous stent graft repair  of infrarenal AAA  11/10   5.6 cm (T. early)  . surgical excision basal cell carcinoma    . tympanic eardrum repair    . VASECTOMY      Family Psychiatric History: see below  Family History:  Family History  Problem Relation Age of Onset  . Emphysema Father   . Cancer Brother        Lung  . Depression Brother   . Depression Sister   . Lung disease Sister        Fibrosis and died from pneumonia on Apr 06, 2013  . Kidney disease Sister   . Diabetes Sister   . Learning disabilities Sister   . Mental retardation Sister   . Heart disease Mother   . Cancer Brother   . Colon cancer Neg Hx   . Dementia  Neg Hx   . Alcohol abuse Neg Hx   . Drug abuse Neg Hx   . Paranoid behavior Neg Hx   . Schizophrenia Neg Hx   . Anxiety disorder Neg Hx   . Bipolar disorder Neg Hx   . OCD Neg Hx   . Sexual abuse Neg Hx   . Physical abuse Neg Hx   . Seizures Neg Hx   . Esophageal cancer Neg Hx   . Stomach cancer Neg Hx   . Rectal cancer Neg Hx     Social History:  Social History   Socioeconomic History  . Marital status: Widowed    Spouse name: Not on file  . Number of children: 2  . Years of education: Not on file  . Highest education level: Not on file  Occupational History    Employer: RETIRED  Tobacco Use  . Smoking status: Current Every Day Smoker    Packs/day: 1.00    Years: 60.00    Pack years: 60.00    Types: Cigarettes  . Smokeless tobacco: Never Used  . Tobacco comment: smokes about 16-20 cigs a day as of 04/04/2013  Vaping Use  . Vaping Use: Never used  Substance and Sexual Activity  . Alcohol use: No  . Drug use: No  . Sexual activity: Yes    Partners: Female    Birth control/protection: Surgical    Comment: vasectomy  Other Topics Concern  . 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 retired 84 years - keeping busy. Lives alone. 1- step-daughter, 1 son - '68; 3 grandchildren. 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 Difficulty of Paying Living Expenses: Not very hard  Food Insecurity:   . Worried About Charity fundraiser in the Last Year: Not on file  . Ran Out of Food in the Last Year: Not on file  Transportation Needs:   . Lack of Transportation (Medical): Not on file  . Lack of Transportation (Non-Medical): Not on file  Physical Activity:   . Days of Exercise per Week: Not on file  . Minutes of Exercise per Session: Not on file  Stress:   . Feeling of Stress : Not on file  Social Connections:   . Frequency of Communication with Friends and Family: Not on  file  . Frequency of Social Gatherings with Friends and Family: Not on file  . Attends Religious Services: Not on file  . Active Member of Clubs or Organizations: Not on file  . Attends Archivist Meetings: Not on file  .  Marital Status: Not on file    Allergies:  Allergies  Allergen Reactions  . Lisinopril-Hydrochlorothiazide Swelling    ZESTRIL  . Doxycycline Rash    Metabolic Disorder Labs: Lab Results  Component Value Date   HGBA1C 6.4 (H) 02/21/2020   MPG 137 02/21/2020   MPG 146 04/27/2019   No results found for: PROLACTIN Lab Results  Component Value Date   CHOL 122 02/21/2020   TRIG 66 02/21/2020   HDL 47 02/21/2020   CHOLHDL 2.6 02/21/2020   VLDL 17 05/26/2017   LDLCALC 60 02/21/2020   LDLCALC 57 04/27/2019   Lab Results  Component Value Date   TSH 1.266 12/21/2018   TSH 2.071 10/27/2018    Therapeutic Level Labs: No results found for: LITHIUM No results found for: VALPROATE No components found for:  CBMZ  Current Medications: Current Outpatient Medications  Medication Sig Dispense Refill  . aspirin EC 81 MG tablet Take 81 mg by mouth every morning.    Marland Kitchen azithromycin (ZITHROMAX) 250 MG tablet Take 2 tablets x 1 day then 1 tablet daily x 4 days (Patient not taking: Reported on 07/03/2020) 6 tablet 0  . cetirizine (ZYRTEC) 10 MG tablet Take 1 tablet (10 mg total) by mouth daily. (Patient not taking: Reported on 07/03/2020) 30 tablet 2  . clobetasol (TEMOVATE) 0.05 % external solution Apply 1 application topically daily as needed (dry scalp).    . clonazePAM (KLONOPIN) 0.5 MG tablet Take 0.5 tablets (0.25 mg total) by mouth 3 (three) times daily. 45 tablet 3  . ezetimibe (ZETIA) 10 MG tablet Take 1 tablet (10 mg total) by mouth daily. NEED APPOINTMENT FOR FUTURE REFILL. 90 tablet 3  . famotidine (PEPCID) 10 MG tablet Take 10 mg by mouth as needed for heartburn or indigestion. (Patient not taking: Reported on 07/03/2020)    . FLUoxetine (PROZAC) 20  MG capsule TAKE (1) CAPSULE BY MOUTH EACH MORNING. 90 capsule 2  . glimepiride (AMARYL) 1 MG tablet TAKE 1 TABLET BY MOUTH TWICE DAILY 180 tablet 3  . hydrochlorothiazide (HYDRODIURIL) 25 MG tablet TAKE 1 TABLET BY MOUTH DAILY 90 tablet 1  . ibuprofen (ADVIL,MOTRIN) 200 MG tablet Take 400 mg by mouth every 4 (four) hours as needed for mild pain.    Marland Kitchen ipratropium (ATROVENT) 0.03 % nasal spray Place 2 sprays into both nostrils every 12 (twelve) hours. (Patient not taking: Reported on 07/03/2020) 30 mL 12  . metoprolol succinate (TOPROL-XL) 25 MG 24 hr tablet TAKE ONE-HALF TABLET BY MOUTH DAILY. GENERIC EQUIVALENT FOR TOPROL XL. 45 tablet 3  . montelukast (SINGULAIR) 10 MG tablet Take 10 mg by mouth at bedtime.    Marland Kitchen NIFEdipine (PROCARDIA-XL/NIFEDICAL-XL) 30 MG 24 hr tablet TAKE 1 TABLET BY MOUTH EVERY DAY 90 tablet 0  . polyethylene glycol (MIRALAX / GLYCOLAX) packet Take 17 g by mouth daily as needed for mild constipation. (Patient not taking: Reported on 07/03/2020)    . ramipril (ALTACE) 10 MG capsule TAKE ONE CAPSULE BY MOUTH EVERY DAY. GENERIC EQUIVALENT FOR ALTACE 90 capsule 3  . sildenafil (REVATIO) 20 MG tablet Take 3 tablets 1 hour before intercourse 20 tablet 0  . sildenafil (VIAGRA) 100 MG tablet TAKE 1/2 TO 1 TABLET BY MOUTH DAILY AS NEEDED FOR ERECTILE DYSFUNCTION 6 tablet 0  . simvastatin (ZOCOR) 40 MG tablet TAKE (1) TABLET BY MOUTH AT BEDTIME. 90 tablet 0  . vitamin B-12 (CYANOCOBALAMIN) 1000 MCG tablet Take 1,000 mcg by mouth daily.     No current facility-administered  medications for this visit.     Musculoskeletal: Strength & Muscle Tone: within normal limits Gait & Station: normal Patient leans: N/A  Psychiatric Specialty Exam: Review of Systems  All other systems reviewed and are negative.   There were no vitals taken for this visit.There is no height or weight on file to calculate BMI.  General Appearance: NA  Eye Contact:  NA  Speech:  Clear and Coherent  Volume:   Normal  Mood:  Anxious  Affect:  NA  Thought Process:  Goal Directed  Orientation:  Full (Time, Place, and Person)  Thought Content: WDL   Suicidal Thoughts:  No  Homicidal Thoughts:  No  Memory:  Immediate;   Good Recent;   Good Remote;   Good  Judgement:  Good  Insight:  Fair  Psychomotor Activity:  Normal  Concentration:  Concentration: Good and Attention Span: Good  Recall:  Good  Fund of Knowledge: Good  Language: Good  Akathisia:  No  Handed:  Right  AIMS (if indicated): not done  Assets:  Communication Skills Desire for Improvement Physical Health Resilience Social Support Talents/Skills  ADL's:  Intact  Cognition: WNL  Sleep:  Good   Screenings: PHQ2-9     Office Visit from 02/22/2020 in [place] Office Visit from 04/30/2019 in [place] Office Visit from 12/05/2018 in [place] Office Visit from 11/28/2018 in [place] Office Visit from 11/15/2018 in [place]  PHQ-2 Total Score 0 0 0 0 0  PHQ-9 Total Score 0 -- -- -- --       Assessment and Plan: This patient is an 86 year old male with a history of depression and anxiety. He is a little bit too anxious when we cut the clonazepam back to just twice a day so we will go back to 0.25 mg:3 times a day. He will also continue Prozac 20 mg daily for depression. He will return to see me in 4 months   [name], MD 08/29/2020, 11:24 AM

## 2020-09-04 ENCOUNTER — Telehealth: Payer: Self-pay | Admitting: *Deleted

## 2020-09-04 NOTE — Telephone Encounter (Signed)
Received call from patient.   Reports that he is having increased sinus pressure/ pain and HA. Requested ABTx.   Advised to use OTC medications for Sx management. States that he has been treating Sx with OTC meds with no relief.   Appointment scheduled.

## 2020-09-04 NOTE — Telephone Encounter (Signed)
noted 

## 2020-09-05 ENCOUNTER — Ambulatory Visit (INDEPENDENT_AMBULATORY_CARE_PROVIDER_SITE_OTHER): Payer: Medicare Other | Admitting: Family Medicine

## 2020-09-05 ENCOUNTER — Encounter: Payer: Self-pay | Admitting: Family Medicine

## 2020-09-05 ENCOUNTER — Other Ambulatory Visit: Payer: Self-pay

## 2020-09-05 VITALS — BP 156/90 | HR 60 | Temp 99.1°F | Resp 14 | Ht 69.0 in | Wt 170.0 lb

## 2020-09-05 DIAGNOSIS — I10 Essential (primary) hypertension: Secondary | ICD-10-CM | POA: Diagnosis not present

## 2020-09-05 DIAGNOSIS — E7800 Pure hypercholesterolemia, unspecified: Secondary | ICD-10-CM | POA: Diagnosis not present

## 2020-09-05 DIAGNOSIS — E119 Type 2 diabetes mellitus without complications: Secondary | ICD-10-CM

## 2020-09-05 DIAGNOSIS — J0100 Acute maxillary sinusitis, unspecified: Secondary | ICD-10-CM

## 2020-09-05 DIAGNOSIS — I6523 Occlusion and stenosis of bilateral carotid arteries: Secondary | ICD-10-CM

## 2020-09-05 MED ORDER — AZITHROMYCIN 250 MG PO TABS: ORAL_TABLET | ORAL | 0 refills | Status: DC

## 2020-09-05 MED ORDER — IPRATROPIUM BROMIDE 0.03 % NA SOLN
2.0000 | Freq: Two times a day (BID) | NASAL | 12 refills | Status: DC
Start: 2020-09-05 — End: 2021-07-31

## 2020-09-05 MED ORDER — MONTELUKAST SODIUM 10 MG PO TABS
10.0000 mg | ORAL_TABLET | Freq: Every day | ORAL | 1 refills | Status: DC
Start: 2020-09-05 — End: 2020-12-15

## 2020-09-05 MED ORDER — CETIRIZINE HCL 10 MG PO TABS
10.0000 mg | ORAL_TABLET | Freq: Every day | ORAL | 2 refills | Status: DC
Start: 2020-09-05 — End: 2021-08-26

## 2020-09-05 NOTE — Patient Instructions (Addendum)
F/U 4 months Monitor blood pressure at home  Goal  < 140/90 Take antibiotics meds refilled

## 2020-09-05 NOTE — Assessment & Plan Note (Signed)
He is on sulfonylurea.  We will recheck A1c and adjust as needed.  He is getting some lows but not consistent but I will write him on a little bit higher A1c up to 8% and have hypoglycemia symptoms.

## 2020-09-05 NOTE — Progress Notes (Signed)
Subjective:    Patient ID: Barry [name], male    DOB: Dec 26, 1932, 86 y.o.   MRN: [id number]  Patient presents for Illness (x1 week- nasal congestion, sinus pressure, vertigo, HA- was outside cooking stew in colder weather )   Pt here with sinus pressure , drainae, HA, feels like his typical sinus infections. For the past week  He has had some mild dizziness He had COVID-19 in Oct, he did have MAB infusion done  He has been taking his zyrtec , atrovent nasal spray  No fever  No cough   He had appt with his psychiatrist, he is now taking klonopin 0.25mg  three times a day He is also on prozac 20mg  once a day   HTN- he is taking nifedipine 30mg   and HCTZ   And Metoprolol 25mg  once a day , patient his blood pressure is always up in the office he gets very anxious.  He does not even like to take at home but states he gets so nervous about the number.  He does have appointment with cardiology next month.   DM- taking amaryl 1mg  BID, no hypoglycemia symptoms He check it twice a day , states highest 160, fast somes gets low in 70-80's Last A1C 6.4%  He wants t ohold on vascular follow-up for previous aneurysm repair and repeating CT of lung nodules     Due for flu shot    Review Of Systems:  GEN- denies fatigue, fever, weight loss,weakness, recent illness HEENT- denies eye drainage, change in vision, +nasal discharge, CVS- denies chest pain, palpitations RESP- denies SOB, cough, wheeze ABD- denies N/V, change in stools, abd pain GU- denies dysuria, hematuria, dribbling, incontinence MSK- denies joint pain, muscle aches, injury Neuro- + headache, +dizziness, syncope, seizure activity       Objective:    BP (!) 156/90   Pulse 60   Temp 99.1 F (37.3 C) (Temporal)   Resp 14   Ht 5\' 9"  (1.753 m)   Wt 170 lb (77.1 kg)   SpO2 97%   BMI 25.10 kg/m  GEN- NAD, alert and oriented x3 HEENT- PERRL, EOMI, non injected sclera, pink conjunctiva, MMM, oropharynx clear, + maxillary  sinus tenderness, enlarged turbinates, TM Clear no effyusion Neck- Supple, no LAD  CVS- RRR, no murmur RESP-CTAB ABD-NABS,soft,NT,ND Psych normal affect and mood  EXT- No edema Pulses- Radial 2+        Assessment & Plan:      Problem List Items Addressed This Visit      Unprioritized   Diabetes mellitus type II, controlled ([name])    He is on sulfonylurea.  We will recheck A1c and adjust as needed.  He is getting some lows but not consistent but I will write him on a little bit higher A1c up to 8% and have hypoglycemia symptoms.      Relevant Orders   Hemoglobin A1c   Comprehensive metabolic panel   Essential hypertension    Blood pressure elevated.  He is on multiple medications to help control.  We will have him check his blood pressure at home a couple times a week after he is taking his anxiety medicine.  He has not had any chest pain or shortness of breath.      Relevant Orders   CBC with Differential/Platelet   Hemoglobin A1c   Hyperlipidemia    Other Visit Diagnoses    Acute maxillary sinusitis, recurrence not specified    -  Primary   Sinusitis status  post COVID-19 infection a month ago.  We will treat him with Z-Pak continue Atrovent Zyrtec and Singulair   Relevant Medications   cetirizine (ZYRTEC) 10 MG tablet   ipratropium (ATROVENT) 0.03 % nasal spray   azithromycin (ZITHROMAX) 250 MG tablet      Note: This dictation was prepared with Dragon dictation along with smaller phrase technology. Any transcriptional errors that result from this process are unintentional.

## 2020-09-05 NOTE — Assessment & Plan Note (Signed)
Blood pressure elevated.  He is on multiple medications to help control.  We will have him check his blood pressure at home a couple times a week after he is taking his anxiety medicine.  He has not had any chest pain or shortness of breath.

## 2020-09-06 LAB — COMPREHENSIVE METABOLIC PANEL
AG Ratio: 1.3 (calc) (ref 1.0–2.5)
ALT: 12 U/L (ref 9–46)
AST: 17 U/L (ref 10–35)
Albumin: 3.6 g/dL (ref 3.6–5.1)
Alkaline phosphatase (APISO): 42 U/L (ref 35–144)
BUN/Creatinine Ratio: 21 (calc) (ref 6–22)
BUN: 23 mg/dL (ref 7–25)
CO2: 29 mmol/L (ref 20–32)
Calcium: 8.9 mg/dL (ref 8.6–10.3)
Chloride: 103 mmol/L (ref 98–110)
Creat: 1.12 mg/dL — ABNORMAL HIGH (ref 0.70–1.11)
Globulin: 2.7 g/dL (calc) (ref 1.9–3.7)
Glucose, Bld: 144 mg/dL — ABNORMAL HIGH (ref 65–99)
Potassium: 4.3 mmol/L (ref 3.5–5.3)
Sodium: 139 mmol/L (ref 135–146)
Total Bilirubin: 0.3 mg/dL (ref 0.2–1.2)
Total Protein: 6.3 g/dL (ref 6.1–8.1)

## 2020-09-06 LAB — CBC WITH DIFFERENTIAL/PLATELET
Absolute Monocytes: 878 cells/uL (ref 200–950)
Basophils Absolute: 49 cells/uL (ref 0–200)
Basophils Relative: 0.8 %
Eosinophils Absolute: 104 cells/uL (ref 15–500)
Eosinophils Relative: 1.7 %
HCT: 45.4 % (ref 38.5–50.0)
Hemoglobin: 14.9 g/dL (ref 13.2–17.1)
Lymphs Abs: 1732 cells/uL (ref 850–3900)
MCH: 31.6 pg (ref 27.0–33.0)
MCHC: 32.8 g/dL (ref 32.0–36.0)
MCV: 96.2 fL (ref 80.0–100.0)
MPV: 10.4 fL (ref 7.5–12.5)
Monocytes Relative: 14.4 %
Neutro Abs: 3337 cells/uL (ref 1500–7800)
Neutrophils Relative %: 54.7 %
Platelets: 243 10*3/uL (ref 140–400)
RBC: 4.72 10*6/uL (ref 4.20–5.80)
RDW: 12.5 % (ref 11.0–15.0)
Total Lymphocyte: 28.4 %
WBC: 6.1 10*3/uL (ref 3.8–10.8)

## 2020-09-06 LAB — HEMOGLOBIN A1C
Hgb A1c MFr Bld: 7 % of total Hgb — ABNORMAL HIGH (ref <5.7)
Mean Plasma Glucose: 154 (calc)
eAG (mmol/L): 8.5 (calc)

## 2020-09-09 ENCOUNTER — Other Ambulatory Visit: Payer: Self-pay | Admitting: *Deleted

## 2020-09-09 MED ORDER — EMPAGLIFLOZIN 10 MG PO TABS: 10.0000 mg | ORAL_TABLET | Freq: Every day | ORAL | 3 refills | Status: DC

## 2020-09-09 MED ORDER — GLIMEPIRIDE 1 MG PO TABS
0.5000 mg | ORAL_TABLET | Freq: Two times a day (BID) | ORAL | 3 refills | Status: DC
Start: 2020-09-09 — End: 2020-12-15

## 2020-09-17 NOTE — Progress Notes (Signed)
HPI: FU CAD. Patient underwent cardiac catheterization in 2005; he was found to have nonobstructive disease other than a 90% stenosis in a small branch of the right coronary artery. He was noted to have an 80% iliac stenosis and an abdominal aortic aneurysm. Procedure complicated by peripheral emboli requiring amputation of a digit. Patient has had stent graft of abdominal aortic aneurysm.Carotid Dopplers March 2020 showed moderate bilateral atherosclerotic plaque right greater than left but not resulting in hemodynamically significant stenosis. Echocardiogram June 2021 showed normal LV function, mild LVH, grade 1 diastolic dysfunction, mild aortic stenosis with mean gradient 17 mmHg.  Monitor July 2021 showed sinus rhythm with occasional PAC, PVC, brief PAT, 4 and 5 beat runs of nonsustained ventricular tachycardia and transient brief idioventricular rhythm at 6:33 AM.  Since last seen,he denies increased dyspnea, chest pain, palpitations or syncope.  Current Outpatient Medications  Medication Sig Dispense Refill  . aspirin EC 81 MG tablet Take 81 mg by mouth every morning.    . cetirizine (ZYRTEC) 10 MG tablet Take 1 tablet (10 mg total) by mouth daily. 90 tablet 2  . clobetasol (TEMOVATE) 0.05 % external solution Apply 1 application topically daily as needed (dry scalp).    . clonazePAM (KLONOPIN) 0.5 MG tablet Take 0.5 tablets (0.25 mg total) by mouth 3 (three) times daily. 45 tablet 3  . empagliflozin (JARDIANCE) 10 MG TABS tablet Take 1 tablet (10 mg total) by mouth daily before breakfast. 90 tablet 3  . ezetimibe (ZETIA) 10 MG tablet Take 1 tablet (10 mg total) by mouth daily. NEED APPOINTMENT FOR FUTURE REFILL. 90 tablet 3  . famotidine (PEPCID) 10 MG tablet Take 10 mg by mouth as needed for heartburn or indigestion.     Marland Kitchen FLUoxetine (PROZAC) 20 MG capsule TAKE (1) CAPSULE BY MOUTH EACH MORNING. 90 capsule 2  . glimepiride (AMARYL) 1 MG tablet Take 0.5 tablets (0.5 mg total) by mouth  2 (two) times daily. 90 tablet 3  . hydrochlorothiazide (HYDRODIURIL) 25 MG tablet TAKE 1 TABLET BY MOUTH DAILY 90 tablet 1  . ibuprofen (ADVIL,MOTRIN) 200 MG tablet Take 400 mg by mouth every 4 (four) hours as needed for mild pain.    Marland Kitchen ipratropium (ATROVENT) 0.03 % nasal spray Place 2 sprays into both nostrils every 12 (twelve) hours. 30 mL 12  . metoprolol succinate (TOPROL-XL) 25 MG 24 hr tablet TAKE ONE-HALF TABLET BY MOUTH DAILY. GENERIC EQUIVALENT FOR TOPROL XL. 45 tablet 3  . montelukast (SINGULAIR) 10 MG tablet Take 1 tablet (10 mg total) by mouth at bedtime. 90 tablet 1  . NIFEdipine (PROCARDIA-XL/NIFEDICAL-XL) 30 MG 24 hr tablet TAKE 1 TABLET BY MOUTH EVERY DAY 90 tablet 0  . polyethylene glycol (MIRALAX / GLYCOLAX) packet Take 17 g by mouth daily as needed for mild constipation.     . ramipril (ALTACE) 10 MG capsule TAKE ONE CAPSULE BY MOUTH EVERY DAY. GENERIC EQUIVALENT FOR ALTACE 90 capsule 3  . sildenafil (VIAGRA) 100 MG tablet TAKE 1/2 TO 1 TABLET BY MOUTH DAILY AS NEEDED FOR ERECTILE DYSFUNCTION 6 tablet 0  . simvastatin (ZOCOR) 40 MG tablet TAKE (1) TABLET BY MOUTH AT BEDTIME. 90 tablet 0  . vitamin B-12 (CYANOCOBALAMIN) 1000 MCG tablet Take 1,000 mcg by mouth daily.     No current facility-administered medications for this visit.     Past Medical History:  Diagnosis Date  . AAA (abdominal aortic aneurysm) (HCC) 3-09   4.7 cm  . Allergy  rhinitis  . Anxiety   . Cancer ([place])    skin  . Cataract   . Depression   . Diabetes mellitus   . Diverticulosis   . GERD (gastroesophageal reflux disease)   . Hepatitis   . Hx of colonic polyps 08/26/2015  . Hyperlipidemia   . Hypertension   . Nephrolithiasis   . Personal history of colonic polyps    adenomas, serrated also  . PVD (peripheral vascular disease) ([place])   . Tobacco abuse     Past Surgical History:  Procedure Laterality Date  . COLONOSCOPY  2006, 2008, 2010, 05/27/2011   numerous adenomas - 13 in 2006, 3,  2008, 2 2012 (up to 1 cm), 4 diminutive adenomas, serrated adenomas 2012. Diverticulosis and hemorrhoids also.  Marland Kitchen EYE SURGERY    . EYE SURGERY Left   . Kidney stone operation    . lower limb amputation, other toe 5th    . percutaneous stent graft repair of infrarenal AAA  11/10   5.6 cm (T. early)  . surgical excision basal cell carcinoma    . tympanic eardrum repair    . VASECTOMY      Social History   Socioeconomic History  . Marital status: Widowed    Spouse name: Not on file  . Number of children: 2  . Years of education: Not on file  . Highest education level: Not on file  Occupational History    Employer: RETIRED  Tobacco Use  . Smoking status: Current Every Day Smoker    Packs/day: 1.00    Years: 60.00    Pack years: 60.00    Types: Cigarettes  . Smokeless tobacco: Never Used  . Tobacco comment: smokes about 16-20 cigs a day as of 04/04/2013  Vaping Use  . Vaping Use: Never used  Substance and Sexual Activity  . Alcohol use: No  . Drug use: No  . Sexual activity: Yes    Partners: Female    Birth control/protection: Surgical    Comment: vasectomy  Other Topics Concern  . 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 retired 66 years - keeping busy. Lives alone. 1- step-daughter, 1 son - '68; 3 grandchildren. 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 Difficulty of Paying Living Expenses: Not very hard  Food Insecurity: Not on file  Transportation Needs: Not on file  Physical Activity: Not on file  Stress: Not on file  Social Connections: Not on file  Intimate Partner Violence: Not on file    Family History  Problem Relation Age of Onset  . Emphysema Father   . Cancer Brother        Lung  . Depression Brother   . Depression Sister   . Lung disease Sister        Fibrosis and died from pneumonia on 18-Apr-2013  . Kidney disease Sister   . Diabetes Sister   .  Learning disabilities Sister   . Mental retardation Sister   . Heart disease Mother   . Cancer Brother   . Colon cancer Neg Hx   . Dementia Neg Hx   . Alcohol abuse Neg Hx   . Drug abuse Neg Hx   . Paranoid behavior Neg Hx   . Schizophrenia Neg Hx   . Anxiety disorder Neg Hx   . Bipolar disorder Neg Hx   . OCD Neg Hx   .  Sexual abuse Neg Hx   . Physical abuse Neg Hx   . Seizures Neg Hx   . Esophageal cancer Neg Hx   . Stomach cancer Neg Hx   . Rectal cancer Neg Hx     ROS: Recent problems with vertigo and congestion but no fevers or chills, productive cough, hemoptysis, dysphasia, odynophagia, melena, hematochezia, dysuria, hematuria, rash, seizure activity, orthopnea, PND, pedal edema, claudication. Remaining systems are negative.  Physical Exam: Well-developed well-nourished in no acute distress.  Skin is warm and dry.  HEENT is normal.  Neck is supple.  Chest is clear to auscultation with normal expansion.  Cardiovascular exam is regular rate and rhythm.  2/6 systolic murmur left sternal border. Abdominal exam nontender or distended. No masses palpated. Extremities show no edema. neuro grossly intact  A/P  1 coronary artery disease-patient denies chest pain.  Continue aspirin and statin.  2 hypertension-blood pressure elevated; increase Procardia to 60 mg daily and follow.  3 hyperlipidemia-continue Zocor and Zetia.  He did not tolerate Crestor or Lipitor previously.  4 history of abdominal aortic aneurysm repair-managed by vascular surgery.  5 tobacco abuse-patient counseled on discontinuing.  6 aortic stenosis-plan follow-up echocardiogram June 2022.  Kirk Ruths, MD

## 2020-09-29 ENCOUNTER — Other Ambulatory Visit: Payer: Self-pay

## 2020-09-29 ENCOUNTER — Ambulatory Visit (INDEPENDENT_AMBULATORY_CARE_PROVIDER_SITE_OTHER): Payer: Medicare Other | Admitting: Cardiology

## 2020-09-29 ENCOUNTER — Encounter: Payer: Self-pay | Admitting: Cardiology

## 2020-09-29 VITALS — BP 152/78 | HR 72 | Ht 70.0 in | Wt 172.0 lb

## 2020-09-29 DIAGNOSIS — I714 Abdominal aortic aneurysm, without rupture, unspecified: Secondary | ICD-10-CM

## 2020-09-29 DIAGNOSIS — I6523 Occlusion and stenosis of bilateral carotid arteries: Secondary | ICD-10-CM | POA: Diagnosis not present

## 2020-09-29 DIAGNOSIS — I350 Nonrheumatic aortic (valve) stenosis: Secondary | ICD-10-CM | POA: Diagnosis not present

## 2020-09-29 DIAGNOSIS — I2510 Atherosclerotic heart disease of native coronary artery without angina pectoris: Secondary | ICD-10-CM

## 2020-09-29 DIAGNOSIS — F17200 Nicotine dependence, unspecified, uncomplicated: Secondary | ICD-10-CM | POA: Diagnosis not present

## 2020-09-29 DIAGNOSIS — I10 Essential (primary) hypertension: Secondary | ICD-10-CM

## 2020-09-29 MED ORDER — NIFEDIPINE ER 60 MG PO TB24: 60.0000 mg | ORAL_TABLET | Freq: Every day | ORAL | 3 refills | Status: DC

## 2020-09-29 NOTE — Patient Instructions (Signed)
Medication Instructions:   INCREASE NIFEDIPINE TO 60 MG ONCE DAILY= 2 OF THE 30 MG TABLETS ONCE DAILY   *If you need a refill on your cardiac medications before your next appointment, please call your pharmacy*  Follow-Up: At [facility], you and your health needs are our priority.  As part of our continuing mission to provide you with exceptional heart care, we have created designated Provider Care Teams.  These Care Teams include your primary Cardiologist (physician) and Advanced Practice Providers (APPs -  Physician Assistants and Nurse Practitioners) who all work together to provide you with the care you need, when you need it.  We recommend signing up for the patient portal called "MyChart".  Sign up information is provided on this After Visit Summary.  MyChart is used to connect with patients for Virtual Visits (Telemedicine).  Patients are able to view lab/test results, encounter notes, upcoming appointments, etc.  Non-urgent messages can be sent to your provider as well.   To learn more about what you can do with MyChart, go to [email or web address].    Your next appointment:   6 month(s)  The format for your next appointment:   In Person  Provider:   Kirk Ruths, MD

## 2020-10-07 ENCOUNTER — Other Ambulatory Visit: Payer: Self-pay | Admitting: Family Medicine

## 2020-10-21 ENCOUNTER — Other Ambulatory Visit: Payer: Self-pay

## 2020-10-21 ENCOUNTER — Other Ambulatory Visit: Payer: Medicare Other

## 2020-10-21 ENCOUNTER — Ambulatory Visit: Payer: Medicare Other

## 2020-10-22 ENCOUNTER — Telehealth (INDEPENDENT_AMBULATORY_CARE_PROVIDER_SITE_OTHER): Payer: Medicare Other | Admitting: Family Medicine

## 2020-10-22 ENCOUNTER — Encounter: Payer: Self-pay | Admitting: Family Medicine

## 2020-10-22 DIAGNOSIS — I10 Essential (primary) hypertension: Secondary | ICD-10-CM | POA: Diagnosis not present

## 2020-10-22 MED ORDER — NIFEDIPINE ER 60 MG PO TB24: ORAL_TABLET | ORAL | 3 refills | Status: DC

## 2020-10-22 NOTE — Progress Notes (Signed)
Virtual Visit via Telephone Note  I connected with Barry [name] on 10/22/20 at 2:06pm by telephone and verified that I am speaking with the correct person using two identifiers.  Location: Patient: at home Provider: in office   I discussed the limitations, risks, security and privacy concerns of performing an evaluation and management service by telephone and the availability of in person appointments. I also discussed with the patient that there may be a patient responsible charge related to this service. The patient expressed understanding and agreed to proceed.   History of Present Illness:  Pt preferred virtual visit in setting of COVID-19, he has also felt a little under the weather He thinks it is his blood pressure occasion.  He feels like he is taking too many at one time. He saw his cardiologist and 152/78 on 12/17 his Cardizem was increased to 60 mg at that visit.  He states since then he has had dizzy spells late in the evening has felt more fatigued but he has not had any chest pain shortness of breath no syncopal episodes or headaches.  He typically takes the Cardizem and HCTZ at lunch and then around 4 PM he takes the ramipril in the evening is when he has all the symptoms.  He does state however that because he had COVID-19 before he fell similar he scheduled himself with Covid test for tomorrow to be on the safe side. He denies any fever cough congestion    DM- he is on amaryl 0.5mg  BID  /jardiance , last A1C in Nov 7%  CBG less than 120     Observations/Objective: Unable to observe patient over the phone but normal speech and work of breathing during conversation  Assessment and Plan: I am concerned that his blood pressure medications are all set again on him at the same time because he is taking them close together.  We will change his regimen.  He will take hydrochlorothiazide and ramipril in the morning with breakfast.  He will then take the Cardizem around  dinnertime but will decrease his back to 30 mg as he does not want to stay on the higher dose he is concerned about the side effects.  He can monitor his blood pressure at home but we will get him in the office in 2 weeks to recheck this in person.  He will proceed with getting his Covid test tomorrow as well.  He has not had any hypoglycemia episodes with regards to his diabetes.  If he is not noticing any improvement in his symptoms he will alert me.  Follow Up Instructions:    I discussed the assessment and treatment plan with the patient. The patient was provided an opportunity to ask questions and all were answered. The patient agreed with the plan and demonstrated an understanding of the instructions.   The patient was advised to call back or seek an in-person evaluation if the symptoms worsen or if the condition fails to improve as anticipated.  I provided 13 minutes of non-face-to-face time during this encounter. End Time 2:19pm  Milinda Antis, MD

## 2020-10-23 ENCOUNTER — Other Ambulatory Visit: Payer: Medicare Other

## 2020-10-23 DIAGNOSIS — Z20822 Contact with and (suspected) exposure to covid-19: Secondary | ICD-10-CM

## 2020-10-27 ENCOUNTER — Other Ambulatory Visit: Payer: Self-pay | Admitting: Family Medicine

## 2020-10-27 LAB — NOVEL CORONAVIRUS, NAA: SARS-CoV-2, NAA: NOT DETECTED

## 2020-10-30 ENCOUNTER — Telehealth: Payer: Self-pay | Admitting: Pharmacist

## 2020-10-30 NOTE — Progress Notes (Addendum)
Chronic Care Management Pharmacy Assistant   Name: Barry [name]  MRN: [id number] DOB: Sep 25, 1933  Reason for Encounter: Disease State for HTN.  Patient Questions:  1.  Have you seen any other providers since your last visit? Yes.   2.  Any changes in your medicines or health? Yes.    PCP : Alycia Rossetti, MD  Their chronic conditions include: HTN, CAD, GERD, Type II DM, HLD,  Depression/Anxiety.  Office Visits: 10/22/20 Dr. Buelah Manis (Telemedicine) DECREASED Nifedipine to 30 daily.  09/05/20 Dr. Buelah Manis for Sinusitis . STARTED Azithromycin for 4 days. CHANGED dose of sildenafil Citrate to 100 mg 1/2 tablet daily as needed.  Consults: 09/29/20 Cardio Dr. Stanford Breed INCREASED dose for Nifedipine to 60 mg daily.  Allergies:   Allergies  Allergen Reactions   Lisinopril-Hydrochlorothiazide Swelling    ZESTRIL   Doxycycline Rash    Medications: Outpatient Encounter Medications as of 10/30/2020  Medication Sig   aspirin EC 81 MG tablet Take 81 mg by mouth every morning.   cetirizine (ZYRTEC) 10 MG tablet Take 1 tablet (10 mg total) by mouth daily.   clobetasol (TEMOVATE) 0.05 % external solution Apply 1 application topically daily as needed (dry scalp).   clonazePAM (KLONOPIN) 0.5 MG tablet Take 0.5 tablets (0.25 mg total) by mouth 3 (three) times daily.   empagliflozin (JARDIANCE) 10 MG TABS tablet Take 1 tablet (10 mg total) by mouth daily before breakfast.   ezetimibe (ZETIA) 10 MG tablet Take 1 tablet (10 mg total) by mouth daily. NEED APPOINTMENT FOR FUTURE REFILL.   famotidine (PEPCID) 10 MG tablet Take 10 mg by mouth as needed for heartburn or indigestion.    FLUoxetine (PROZAC) 20 MG capsule TAKE (1) CAPSULE BY MOUTH EACH MORNING.   glimepiride (AMARYL) 1 MG tablet Take 0.5 tablets (0.5 mg total) by mouth 2 (two) times daily.   hydrochlorothiazide (HYDRODIURIL) 25 MG tablet TAKE 1 TABLET BY MOUTH DAILY   ibuprofen (ADVIL,MOTRIN) 200 MG tablet Take 400 mg by mouth  every 4 (four) hours as needed for mild pain.   ipratropium (ATROVENT) 0.03 % nasal spray Place 2 sprays into both nostrils every 12 (twelve) hours.   metoprolol succinate (TOPROL-XL) 25 MG 24 hr tablet TAKE ONE-HALF TABLET BY MOUTH DAILY. GENERIC EQUIVALENT FOR TOPROL XL.   montelukast (SINGULAIR) 10 MG tablet Take 1 tablet (10 mg total) by mouth at bedtime.   NIFEdipine (ADALAT CC) 60 MG 24 hr tablet Take 30mg  once a day   polyethylene glycol (MIRALAX / GLYCOLAX) packet Take 17 g by mouth daily as needed for mild constipation.    ramipril (ALTACE) 10 MG capsule TAKE ONE CAPSULE BY MOUTH EVERY DAY. GENERIC EQUIVALENT FOR ALTACE   sildenafil (VIAGRA) 100 MG tablet TAKE 1/2 TO 1 TABLET BY MOUTH DAILY AS NEEDED FOR ERECTILE DYSFUNCTION   simvastatin (ZOCOR) 40 MG tablet TAKE (1) TABLET BY MOUTH AT BEDTIME.   vitamin B-12 (CYANOCOBALAMIN) 1000 MCG tablet Take 1,000 mcg by mouth daily.   No facility-administered encounter medications on file as of 10/30/2020.    Current Diagnosis: Patient Active Problem List   Diagnosis Date Noted   Frequent PVCs 03/20/2020   Aortic stenosis 03/20/2020   COPD (chronic obstructive pulmonary disease) ([place]) 03/20/2020   Constipation 04/30/2019   Protein-calorie malnutrition ([place]) 04/30/2019   Anxiety with depression 12/05/2018   Seasonal and perennial allergic rhinitis 08/09/2018   Carotid artery disease ([place]) 11/18/2017   Hx of colonic polyps 08/26/2015   Erectile dysfunction 02/17/2015  AAA (abdominal aortic aneurysm) without rupture ([place]) 05/21/2014   Chest pain 01/31/2014   CAD (coronary artery disease) 12/24/2013   HEARING LOSS, CONDUCTIVE, LEFT 02/23/2010   DIZZINESS, CHRONIC 05/28/2008   PULMONARY NODULE 09/07/2007   Diabetes mellitus type II, controlled ([place]) 09/06/2007   Hyperlipidemia 09/06/2007   TOBACCO ABUSE 09/06/2007   Essential hypertension 09/06/2007   ALLERGIC RHINITIS 09/06/2007   GERD 09/06/2007   BASAL CELL CARCINOMA, NOSE  09/06/2007    Goals Addressed   None    Reviewed chart prior to disease state call. Spoke with patient regarding BP  Recent Office Vitals: BP Readings from Last 3 Encounters:  09/29/20 (!) 152/78  09/05/20 (!) 156/90  08/06/20 (!) 201/71   Pulse Readings from Last 3 Encounters:  09/29/20 72  09/05/20 60  08/06/20 (!) 56    Wt Readings from Last 3 Encounters:  09/29/20 172 lb (78 kg)  09/05/20 170 lb (77.1 kg)  03/20/20 172 lb (78 kg)     Kidney Function Lab Results  Component Value Date/Time   CREATININE 1.12 (H) 09/05/2020 09:27 AM   CREATININE 1.03 02/21/2020 08:13 AM   GFR 76.82 07/10/2013 12:13 PM   GFRNONAA >60 12/21/2018 07:04 AM   GFRNONAA 67 05/23/2018 08:11 AM   GFRAA >60 12/21/2018 07:04 AM   GFRAA 77 05/23/2018 08:11 AM    BMP Latest Ref Rng & Units 09/05/2020 02/21/2020 04/27/2019  Glucose 65 - 99 mg/dL 144(H) 139(H) 131(H)  BUN 7 - 25 mg/dL 23 18 18   Creatinine 0.70 - 1.11 mg/dL 1.12(H) 1.03 1.01  BUN/Creat Ratio 6 - 22 (calc) 21 NOT APPLICABLE NOT APPLICABLE  Sodium 378 - 146 mmol/L 139 141 140  Potassium 3.5 - 5.3 mmol/L 4.3 4.2 4.0  Chloride 98 - 110 mmol/L 103 102 103  CO2 20 - 32 mmol/L 29 31 30   Calcium 8.6 - 10.3 mg/dL 8.9 9.0 8.9    Current antihypertensive regimen:  HCTZ 25 mg Metoprolol XL 25 mg Nifedipine XL 30 mg Ramipril 10 mg  How often are you checking your Blood Pressure? Patient stated he does not check his blood pressure because it causes him to worry and then his blood pressure rises.     Current home BP readings: N/A  What recent interventions/DTPs have been made by any provider to improve Blood Pressure control since last CPP Visit: No.  Any recent hospitalizations or ED visits since last visit with CPP? Patient stated he has not been to the hospital or the ED.  What diet changes have been made to improve Blood Pressure Control?  Patient stated he is trying to watch what he eats.Patient stated he eats vegetables and  fruits. He stated he doesn't use much salt.  What exercise is being done to improve your Blood Pressure Control?  Patient stated he's not doing much outside work since the cold weather has stated . Patent stated he cooks, cleans around the house hisself.   Adherence Review: Is the patient currently on ACE/ARB medication? Yes, Ramipril 10 mg   Does the patient have >5 day gap between last estimated fill dates? No, CPP Please Check.  Patient stated he is not having any problems or concerns about his medication at this time.   Follow-Up:  Pharmacist Review   Charlann Lange, RMA Clinical Pharmacist Assistant [phone]  6 minutes spent in review, coordination, and documentation.  Reviewed by: Beverly Milch, PharmD Clinical Pharmacist [facility] [phone]

## 2020-11-04 ENCOUNTER — Other Ambulatory Visit: Payer: Self-pay | Admitting: Family Medicine

## 2020-11-10 ENCOUNTER — Other Ambulatory Visit: Payer: Self-pay

## 2020-11-10 ENCOUNTER — Other Ambulatory Visit: Payer: Self-pay | Admitting: Family Medicine

## 2020-11-10 ENCOUNTER — Ambulatory Visit (INDEPENDENT_AMBULATORY_CARE_PROVIDER_SITE_OTHER): Payer: Medicare Other | Admitting: Family Medicine

## 2020-11-10 ENCOUNTER — Encounter: Payer: Self-pay | Admitting: Family Medicine

## 2020-11-10 VITALS — BP 148/76 | HR 94 | Temp 98.2°F | Resp 14 | Ht 70.0 in | Wt 169.0 lb

## 2020-11-10 DIAGNOSIS — E441 Mild protein-calorie malnutrition: Secondary | ICD-10-CM | POA: Diagnosis not present

## 2020-11-10 DIAGNOSIS — I2510 Atherosclerotic heart disease of native coronary artery without angina pectoris: Secondary | ICD-10-CM

## 2020-11-10 DIAGNOSIS — J309 Allergic rhinitis, unspecified: Secondary | ICD-10-CM | POA: Diagnosis not present

## 2020-11-10 DIAGNOSIS — E119 Type 2 diabetes mellitus without complications: Secondary | ICD-10-CM

## 2020-11-10 DIAGNOSIS — J439 Emphysema, unspecified: Secondary | ICD-10-CM

## 2020-11-10 DIAGNOSIS — I10 Essential (primary) hypertension: Secondary | ICD-10-CM

## 2020-11-10 DIAGNOSIS — K219 Gastro-esophageal reflux disease without esophagitis: Secondary | ICD-10-CM

## 2020-11-10 MED ORDER — FAMOTIDINE 20 MG PO TABS: 20.0000 mg | ORAL_TABLET | Freq: Two times a day (BID) | ORAL | 2 refills | Status: DC

## 2020-11-10 NOTE — Progress Notes (Signed)
Subjective:    Patient ID: Barry [name], male    DOB: 1932-12-28, 87 y.o.   MRN: [id number]  Patient presents for Follow-up (BP)   Pt here for intermin follow up on recent illness and blood pressure He has had a episodes of indigestion for the past 3 days , he has belching, pressure, always on left side which is not new. He took Administrator and didn't help very much  He has not used any of his GERD medications Weight down 3lbs not eating as much   HTN- see note from 2 weeks, we changed his medication schedule, his cardizem was decreased back to 30mg  once a day, since the 60mg  made him dizzy He gets nervouse coming to the doctor but BP was much improved    DM last A1C 7% in November taking jardiance   He had recent sinus infection- had negative  COVID testing  , he still has some sinus congestion but he has not been taking his Singulair he was concerned about possible side effects.  Due for flu shot    Review Of Systems:  GEN- denies fatigue, fever, weight loss,weakness, recent illness HEENT- denies eye drainage, change in vision, +nasal discharge, CVS- denies chest pain, palpitations RESP- denies SOB, cough, wheeze ABD- denies N/V, change in stools, abd pain GU- denies dysuria, hematuria, dribbling, incontinence MSK- denies joint pain, muscle aches, injury Neuro- denies headache, dizziness, syncope, seizure activity       Objective:    BP (!) 148/76   Pulse 94   Temp 98.2 F (36.8 C) (Temporal)   Resp 14   Ht 5\' 10"  (1.778 m)   Wt 169 lb (76.7 kg)   SpO2 94%   BMI 24.25 kg/m  GEN- NAD, alert and oriented x3 HEENT- PERRL, EOMI, non injected sclera, pink conjunctiva, MMM, oropharynx clear, nares nasal congestion no maxillary sinus tenderness TMs clear bilaterally no effusion Neck- Supple, no lymphadenopathy CVS- RRR, 3/6 systolic  murmur RESP-CTAB ABD-NABS,soft,NT,ND EXT- No edema Pulses- Radial, DP- 2+        Assessment & Plan:      Problem List  Items Addressed This Visit      Unprioritized   Allergic rhinitis    .  Multiple medications that he does not work or he did not tolerate.  Advised him to try the Singulair at bedtime if he noticed any new changes we can discontinue it.      CAD (coronary artery disease)    Blood pressure is around his baseline now.  He does elevate more when he is in the office.  He does have the extra Cardizem on hand but feels better with the lower dose without any dizzy symptoms.      COPD (chronic obstructive pulmonary disease) ([place]) - Primary    Recent sinusitis that has improved.  COPD at baseline has not had any exacerbations.      Diabetes mellitus type II, controlled ([place])    Diabetes has been fairly well controlled.  Patient will return for fasting labs in about 4 weeks      Essential hypertension   GERD    Restart Pepcid 20 mg twice a day as needed for indigestion symptoms.  Does have control symptoms in the past.      Relevant Medications   famotidine (PEPCID) 20 MG tablet   Protein-calorie malnutrition ([place])    Worsening setting of recent illness as well as indigestion.  Treatment per above.  Note: This dictation was prepared with Dragon dictation along with smaller phrase technology. Any transcriptional errors that result from this process are unintentional.

## 2020-11-10 NOTE — Assessment & Plan Note (Signed)
.    Multiple medications that he does not work or he did not tolerate.  Advised him to try the Singulair at bedtime if he noticed any new changes we can discontinue it.

## 2020-11-10 NOTE — Assessment & Plan Note (Signed)
Diabetes has been fairly well controlled.  Patient will return for fasting labs in about 4 weeks

## 2020-11-10 NOTE — Patient Instructions (Addendum)
Try the singulair Use pepcid for the indigestion F/U 3-4 weeks for fasting labs  F/U 4 months Dr. Dennard Schaumann

## 2020-11-10 NOTE — Assessment & Plan Note (Signed)
Restart Pepcid 20 mg twice a day as needed for indigestion symptoms.  Does have control symptoms in the past.

## 2020-11-10 NOTE — Assessment & Plan Note (Signed)
Recent sinusitis that has improved.  COPD at baseline has not had any exacerbations.

## 2020-11-10 NOTE — Assessment & Plan Note (Signed)
Worsening setting of recent illness as well as indigestion.  Treatment per above.

## 2020-11-10 NOTE — Assessment & Plan Note (Signed)
Blood pressure is around his baseline now.  He does elevate more when he is in the office.  He does have the extra Cardizem on hand but feels better with the lower dose without any dizzy symptoms.

## 2020-11-14 ENCOUNTER — Telehealth: Payer: Self-pay | Admitting: Pharmacist

## 2020-11-14 NOTE — Progress Notes (Addendum)
Verified Adherence Gap Information. Per insurance data, the patient is not at goal with controlling BP with a goal of >140. Last reading was 162/88. The last AWV was 11/28/2018 . Their most recent A1c 6.4% recorded on 02-21-2020.  Fanny Skates, [place] Pharmacist Assistant [phone]  2 minutes spent in review, coordination, and documentation.  Reviewed by: Beverly Milch, PharmD Clinical Pharmacist [facility] [phone]

## 2020-11-20 ENCOUNTER — Other Ambulatory Visit: Payer: Self-pay | Admitting: Cardiology

## 2020-11-20 DIAGNOSIS — I2510 Atherosclerotic heart disease of native coronary artery without angina pectoris: Secondary | ICD-10-CM

## 2020-11-20 DIAGNOSIS — Z23 Encounter for immunization: Secondary | ICD-10-CM | POA: Diagnosis not present

## 2020-11-27 DIAGNOSIS — M25562 Pain in left knee: Secondary | ICD-10-CM | POA: Diagnosis not present

## 2020-11-27 DIAGNOSIS — M1712 Unilateral primary osteoarthritis, left knee: Secondary | ICD-10-CM | POA: Diagnosis not present

## 2020-12-02 ENCOUNTER — Encounter (HOSPITAL_COMMUNITY): Payer: Self-pay | Admitting: Psychiatry

## 2020-12-02 ENCOUNTER — Other Ambulatory Visit: Payer: Self-pay

## 2020-12-02 ENCOUNTER — Telehealth (INDEPENDENT_AMBULATORY_CARE_PROVIDER_SITE_OTHER): Payer: Medicare Other | Admitting: Psychiatry

## 2020-12-02 DIAGNOSIS — I2510 Atherosclerotic heart disease of native coronary artery without angina pectoris: Secondary | ICD-10-CM

## 2020-12-02 DIAGNOSIS — F331 Major depressive disorder, recurrent, moderate: Secondary | ICD-10-CM | POA: Diagnosis not present

## 2020-12-02 DIAGNOSIS — F411 Generalized anxiety disorder: Secondary | ICD-10-CM

## 2020-12-02 MED ORDER — FLUOXETINE HCL 20 MG PO CAPS
ORAL_CAPSULE | ORAL | 2 refills | Status: DC
Start: 2020-12-02 — End: 2021-04-02

## 2020-12-02 MED ORDER — CLONAZEPAM 0.5 MG PO TABS
0.2500 mg | ORAL_TABLET | Freq: Four times a day (QID) | ORAL | 3 refills | Status: DC
Start: 2020-12-02 — End: 2021-03-17

## 2020-12-02 NOTE — Progress Notes (Signed)
Virtual Visit via Telephone Note  I connected with [name] on 12/02/20 at 11:40 AM EST by telephone and verified that I am speaking with the correct person using two identifiers.  Location: Patient: office Provider: note   I discussed the limitations, risks, security and privacy concerns of performing an evaluation and management service by telephone and the availability of in person appointments. I also discussed with the patient that there may be a patient responsible charge related to this service. The patient expressed understanding and agreed to proceed.    I discussed the assessment and treatment plan with the patient. The patient was provided an opportunity to ask questions and all were answered. The patient agreed with the plan and demonstrated an understanding of the instructions.   The patient was advised to call back or seek an in-person evaluation if the symptoms worsen or if the condition fails to improve as anticipated.  I provided 15 minutes of non-face-to-face time during this encounter.   [name], MD  [facility] MD/PA/NP OP Progress Note  12/02/2020 11:45 AM [name]  MRN:  [id number]  Chief Complaint:  Chief Complaint    Anxiety; Follow-up     HPI: This patient is an 87 year old widowed white male lives alone in [name]. He has 2 children and 3 grandchildren. 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  The patient returns for follow-up after 3 months.  He tells me again that the clonazepam is not lasting long enough.  He takes 0.25 mg 3 times a day.  However towards the end of the day he is getting a headache and feeling very anxious.  I explained to him that given his age 69 times a day would be the maximum and he agrees to try this.  He denies being depressed or suicidal.  His mood has been stable.  He is sleeping fairly well and still remaining very active. Visit Diagnosis:    ICD-10-CM   1. Major depressive  disorder, recurrent episode, moderate (HCC)  F33.1   2. Generalized anxiety disorder  F41.1     Past Psychiatric History: Long-term outpatient treatment for depression and anxiety  Past Medical History:  Past Medical History:  Diagnosis Date  . AAA (abdominal aortic aneurysm) (HCC) 3-09   4.7 cm  . Allergy    rhinitis  . Anxiety   . Cancer ([place])    skin  . Cataract   . Depression   . Diabetes mellitus   . Diverticulosis   . GERD (gastroesophageal reflux disease)   . Hepatitis   . Hx of colonic polyps 08/26/2015  . Hyperlipidemia   . Hypertension   . Nephrolithiasis   . Personal history of colonic polyps    adenomas, serrated also  . PVD (peripheral vascular disease) ([place])   . Tobacco abuse     Past Surgical History:  Procedure Laterality Date  . COLONOSCOPY  2006, 2008, 2010, 05/27/2011   numerous adenomas - 13 in 2006, 3, 2008, 2 2012 (up to 1 cm), 4 diminutive adenomas, serrated adenomas 2012. Diverticulosis and hemorrhoids also.  Marland Kitchen EYE SURGERY    . EYE SURGERY Left   . Kidney stone operation    . lower limb amputation, other toe 5th    . percutaneous stent graft repair of infrarenal AAA  11/10   5.6 cm (T. early)  . surgical excision basal cell carcinoma    . tympanic eardrum repair    . VASECTOMY  Family Psychiatric History: see below  Family History:  Family History  Problem Relation Age of Onset  . Emphysema Father   . Cancer Brother        Lung  . Depression Brother   . Depression Sister   . Lung disease Sister        Fibrosis and died from pneumonia on 2013-04-09  . Kidney disease Sister   . Diabetes Sister   . Learning disabilities Sister   . Mental retardation Sister   . Heart disease Mother   . Cancer Brother   . Colon cancer Neg Hx   . Dementia Neg Hx   . Alcohol abuse Neg Hx   . Drug abuse Neg Hx   . Paranoid behavior Neg Hx   . Schizophrenia Neg Hx   . Anxiety disorder Neg Hx   . Bipolar disorder Neg Hx   . OCD Neg Hx   . Sexual  abuse Neg Hx   . Physical abuse Neg Hx   . Seizures Neg Hx   . Esophageal cancer Neg Hx   . Stomach cancer Neg Hx   . Rectal cancer Neg Hx     Social History:  Social History   Socioeconomic History  . Marital status: Widowed    Spouse name: Not on file  . Number of children: 2  . Years of education: Not on file  . Highest education level: Not on file  Occupational History    Employer: RETIRED  Tobacco Use  . Smoking status: Current Every Day Smoker    Packs/day: 1.00    Years: 60.00    Pack years: 60.00    Types: Cigarettes  . Smokeless tobacco: Never Used  . Tobacco comment: smokes about 16-20 cigs a day as of 04/04/2013  Vaping Use  . Vaping Use: Never used  Substance and Sexual Activity  . Alcohol use: No  . Drug use: No  . Sexual activity: Yes    Partners: Female    Birth control/protection: Surgical    Comment: vasectomy  Other Topics Concern  . 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 retired 50 years - keeping busy. Lives alone. 1- step-daughter, 1 son - '68; 3 grandchildren. 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 Difficulty of Paying Living Expenses: Not very hard  Food Insecurity: Not on file  Transportation Needs: Not on file  Physical Activity: Not on file  Stress: Not on file  Social Connections: Not on file    Allergies:  Allergies  Allergen Reactions  . Lisinopril-Hydrochlorothiazide Swelling    ZESTRIL  . Doxycycline Rash    Metabolic Disorder Labs: Lab Results  Component Value Date   HGBA1C 7.0 (H) 09/05/2020   MPG 154 09/05/2020   MPG 137 02/21/2020   No results found for: PROLACTIN Lab Results  Component Value Date   CHOL 122 02/21/2020   TRIG 66 02/21/2020   HDL 47 02/21/2020   CHOLHDL 2.6 02/21/2020   VLDL 17 05/26/2017   LDLCALC 60 02/21/2020   LDLCALC 57 04/27/2019   Lab Results  Component Value Date   TSH 1.266  12/21/2018   TSH 2.071 10/27/2018    Therapeutic Level Labs: No results found for: LITHIUM No results found for: VALPROATE No components found for:  CBMZ  Current Medications: Current Outpatient Medications  Medication Sig Dispense Refill  . aspirin EC 81 MG tablet  Take 81 mg by mouth every morning.    . cetirizine (ZYRTEC) 10 MG tablet Take 1 tablet (10 mg total) by mouth daily. 90 tablet 2  . clobetasol (TEMOVATE) 0.05 % external solution Apply 1 application topically daily as needed (dry scalp).    . clonazePAM (KLONOPIN) 0.5 MG tablet Take 0.5 tablets (0.25 mg total) by mouth in the morning, at noon, in the evening, and at bedtime. 60 tablet 3  . empagliflozin (JARDIANCE) 10 MG TABS tablet Take 1 tablet (10 mg total) by mouth daily before breakfast. 90 tablet 3  . ezetimibe (ZETIA) 10 MG tablet TAKE 1 TABLET BY MOUTH DAILY, NEED APPOINTMENT FOR FUTURE REFILL GENERIC EQUIVALENT FOR ZETIA 90 tablet 1  . famotidine (PEPCID) 20 MG tablet Take 1 tablet (20 mg total) by mouth 2 (two) times daily. As needed 60 tablet 2  . FLUoxetine (PROZAC) 20 MG capsule TAKE (1) CAPSULE BY MOUTH EACH MORNING. 90 capsule 2  . glimepiride (AMARYL) 1 MG tablet Take 0.5 tablets (0.5 mg total) by mouth 2 (two) times daily. 90 tablet 3  . hydrochlorothiazide (HYDRODIURIL) 25 MG tablet TAKE 1 TABLET BY MOUTH DAILY 90 tablet 1  . ibuprofen (ADVIL,MOTRIN) 200 MG tablet Take 400 mg by mouth every 4 (four) hours as needed for mild pain.    Marland Kitchen ipratropium (ATROVENT) 0.03 % nasal spray Place 2 sprays into both nostrils every 12 (twelve) hours. 30 mL 12  . metoprolol succinate (TOPROL-XL) 25 MG 24 hr tablet TAKE ONE-HALF TABLET BY MOUTH DAILY. GENERIC EQUIVALENT FOR TOPROL XL. 45 tablet 3  . montelukast (SINGULAIR) 10 MG tablet Take 1 tablet (10 mg total) by mouth at bedtime. 90 tablet 1  . NIFEdipine (ADALAT CC) 60 MG 24 hr tablet Take 30mg  once a day 90 tablet 3  . polyethylene glycol (MIRALAX / GLYCOLAX) packet Take 17  g by mouth daily as needed for mild constipation.     . ramipril (ALTACE) 10 MG capsule TAKE ONE CAPSULE BY MOUTH EVERY DAY. GENERIC EQUIVALENT FOR ALTACE 90 capsule 3  . sildenafil (VIAGRA) 100 MG tablet TAKE 1/2 TO 1 TABLET BY MOUTH DAILY AS NEEDED FOR ERECTILE DYSFUNCTION 6 tablet 0  . simvastatin (ZOCOR) 40 MG tablet TAKE (1) TABLET BY MOUTH AT BEDTIME. 90 tablet 0  . vitamin B-12 (CYANOCOBALAMIN) 1000 MCG tablet Take 1,000 mcg by mouth daily.     No current facility-administered medications for this visit.     Musculoskeletal: Strength & Muscle Tone: within normal limits Gait & Station: normal Patient leans: N/A  Psychiatric Specialty Exam: Review of Systems  Psychiatric/Behavioral: The patient is nervous/anxious.   All other systems reviewed and are negative.   There were no vitals taken for this visit.There is no height or weight on file to calculate BMI.  General Appearance: NA  Eye Contact:  NA  Speech:  Clear and Coherent  Volume:  Normal  Mood:  Anxious  Affect:  NA  Thought Process:  Goal Directed  Orientation:  Full (Time, Place, and Person)  Thought Content: WDL   Suicidal Thoughts:  No  Homicidal Thoughts:  No  Memory:  Immediate;   Good Recent;   Good Remote;   Good  Judgement:  Good  Insight:  Fair  Psychomotor Activity:  Normal  Concentration:  Concentration: Good and Attention Span: Good  Recall:  Good  Fund of Knowledge: Good  Language: Good  Akathisia:  No  Handed:  Right  AIMS (if indicated): not done  Assets:  Communication Skills Desire for Improvement Resilience Social Support Talents/Skills  ADL's:  Intact  Cognition: WNL  Sleep:  Good   Screenings: PHQ2-9   [place] Office Visit from 11/10/2020 in [place] Office Visit from 02/22/2020 in [place] Office Visit from 04/30/2019 in [place] Office Visit from 12/05/2018 in [place] Office Visit from 11/28/2018  in [place]  PHQ-2 Total Score 0 0 0 0 0  PHQ-9 Total Score 3 0 -- -- --       Assessment and Plan: This patient is 87 year old male with a history of depression and anxiety.  He is anxious towards the end of the day so we will increase the clonazepam to 0.25 mg 4 times daily.  I explained this is high as we can go.  He will continue Prozac 20 mg daily for depression.  He will return to see me in 4 months   [name], MD 12/02/2020, 11:45 AM

## 2020-12-08 ENCOUNTER — Other Ambulatory Visit: Payer: Medicare Other

## 2020-12-12 ENCOUNTER — Other Ambulatory Visit: Payer: Medicare Other

## 2020-12-12 ENCOUNTER — Other Ambulatory Visit: Payer: Self-pay

## 2020-12-12 DIAGNOSIS — I2510 Atherosclerotic heart disease of native coronary artery without angina pectoris: Secondary | ICD-10-CM | POA: Diagnosis not present

## 2020-12-12 DIAGNOSIS — E119 Type 2 diabetes mellitus without complications: Secondary | ICD-10-CM

## 2020-12-13 LAB — COMPREHENSIVE METABOLIC PANEL
AG Ratio: 1.4 (calc) (ref 1.0–2.5)
ALT: 15 U/L (ref 9–46)
AST: 19 U/L (ref 10–35)
Albumin: 3.6 g/dL (ref 3.6–5.1)
Alkaline phosphatase (APISO): 39 U/L (ref 35–144)
BUN: 22 mg/dL (ref 7–25)
CO2: 31 mmol/L (ref 20–32)
Calcium: 9.3 mg/dL (ref 8.6–10.3)
Chloride: 102 mmol/L (ref 98–110)
Creat: 1.06 mg/dL (ref 0.70–1.11)
Globulin: 2.6 g/dL (calc) (ref 1.9–3.7)
Glucose, Bld: 128 mg/dL — ABNORMAL HIGH (ref 65–99)
Potassium: 4.2 mmol/L (ref 3.5–5.3)
Sodium: 141 mmol/L (ref 135–146)
Total Bilirubin: 0.4 mg/dL (ref 0.2–1.2)
Total Protein: 6.2 g/dL (ref 6.1–8.1)

## 2020-12-13 LAB — CBC WITH DIFFERENTIAL/PLATELET
Absolute Monocytes: 873 cells/uL (ref 200–950)
Basophils Absolute: 57 cells/uL (ref 0–200)
Basophils Relative: 0.8 %
Eosinophils Absolute: 192 cells/uL (ref 15–500)
Eosinophils Relative: 2.7 %
HCT: 47.9 % (ref 38.5–50.0)
Hemoglobin: 16.1 g/dL (ref 13.2–17.1)
Lymphs Abs: 2031 cells/uL (ref 850–3900)
MCH: 32.3 pg (ref 27.0–33.0)
MCHC: 33.6 g/dL (ref 32.0–36.0)
MCV: 96 fL (ref 80.0–100.0)
MPV: 10.1 fL (ref 7.5–12.5)
Monocytes Relative: 12.3 %
Neutro Abs: 3948 cells/uL (ref 1500–7800)
Neutrophils Relative %: 55.6 %
Platelets: 264 10*3/uL (ref 140–400)
RBC: 4.99 10*6/uL (ref 4.20–5.80)
RDW: 13.1 % (ref 11.0–15.0)
Total Lymphocyte: 28.6 %
WBC: 7.1 10*3/uL (ref 3.8–10.8)

## 2020-12-13 LAB — LIPID PANEL
Cholesterol: 132 mg/dL (ref <200)
HDL: 54 mg/dL (ref >=40)
LDL Cholesterol (Calc): 61 mg/dL (calc)
Non-HDL Cholesterol (Calc): 78 mg/dL (calc) (ref <130)
Total CHOL/HDL Ratio: 2.4 (calc) (ref <5.0)
Triglycerides: 89 mg/dL (ref <150)

## 2020-12-13 LAB — HEMOGLOBIN A1C
Hgb A1c MFr Bld: 6.7 % of total Hgb — ABNORMAL HIGH (ref <5.7)
Mean Plasma Glucose: 146 mg/dL
eAG (mmol/L): 8.1 mmol/L

## 2020-12-15 ENCOUNTER — Ambulatory Visit (INDEPENDENT_AMBULATORY_CARE_PROVIDER_SITE_OTHER): Payer: Medicare Other | Admitting: Family Medicine

## 2020-12-15 ENCOUNTER — Other Ambulatory Visit: Payer: Self-pay

## 2020-12-15 ENCOUNTER — Encounter: Payer: Self-pay | Admitting: Family Medicine

## 2020-12-15 VITALS — BP 134/70 | HR 64 | Temp 98.3°F | Resp 14 | Ht 70.0 in | Wt 169.0 lb

## 2020-12-15 DIAGNOSIS — I493 Ventricular premature depolarization: Secondary | ICD-10-CM | POA: Diagnosis not present

## 2020-12-15 DIAGNOSIS — R42 Dizziness and giddiness: Secondary | ICD-10-CM | POA: Diagnosis not present

## 2020-12-15 DIAGNOSIS — I10 Essential (primary) hypertension: Secondary | ICD-10-CM

## 2020-12-15 DIAGNOSIS — E119 Type 2 diabetes mellitus without complications: Secondary | ICD-10-CM

## 2020-12-15 DIAGNOSIS — Z23 Encounter for immunization: Secondary | ICD-10-CM

## 2020-12-15 DIAGNOSIS — I2510 Atherosclerotic heart disease of native coronary artery without angina pectoris: Secondary | ICD-10-CM

## 2020-12-15 MED ORDER — GLIMEPIRIDE 1 MG PO TABS
ORAL_TABLET | ORAL | 1 refills | Status: DC
Start: 2020-12-15 — End: 2021-03-11

## 2020-12-15 MED ORDER — NIFEDIPINE ER 60 MG PO TB24: ORAL_TABLET | ORAL | 3 refills | Status: DC

## 2020-12-15 MED ORDER — MONTELUKAST SODIUM 10 MG PO TABS
10.0000 mg | ORAL_TABLET | Freq: Every day | ORAL | 1 refills | Status: DC
Start: 2020-12-15 — End: 2021-12-23

## 2020-12-15 NOTE — Assessment & Plan Note (Signed)
History of coronary artery disease as well frequent PVCs.  We discussed coming off of the Cardizem altogether but he prefers to stay on a low dose of until he sees his cardiologist.  He is taking approximately 15 mg once a day.  He is also on low-dose metoprolol.  His blood pressure looks good here in the office.

## 2020-12-15 NOTE — Progress Notes (Signed)
Subjective:    Patient ID: [name], male    DOB: Aug 07, 1933, 87 y.o.   MRN: [id number]  Patient presents for Medication Management (Nifedipine- thinks it is causing his vertigo)   Pt here to f/u chronic medical problems    He would like to discuss cardeizem which he takes for blood pressure.  He was up to 60mg  and this made him dizzy, so I decreased to  30mg  but still had episode, she he went down to 1/4 of pill ( 15mg ) and did better on this for the past week.  He still taking his other medications as prescribed.   DM- reviewed recent fasting labs  He is taking jardiance glimieride.  He has had a couple episodes where his blood sugar goes into the 70s and he does feel little shaky after eating it improves.  He typically eats about lunch and dinner.    - he has seen eye doctor- My eye doctor   Acid reflux- has not needed pepcid past few weeks, bowels are good    Review Of Systems:  GEN- denies fatigue, fever, weight loss,weakness, recent illness HEENT- denies eye drainage, change in vision, nasal discharge, CVS- denies chest pain, palpitations RESP- denies SOB, cough, wheeze ABD- denies N/V, change in stools, abd pain GU- denies dysuria, hematuria, dribbling, incontinence MSK- denies joint pain, muscle aches, injury Neuro- denies headache, +dizziness, syncope, seizure activity       Objective:    BP 134/70   Pulse 64   Temp 98.3 F (36.8 C) (Temporal)   Resp 14   Ht 5\' 10"  (1.778 m)   Wt 169 lb (76.7 kg)   SpO2 98%   BMI 24.25 kg/m  GEN- NAD, alert and oriented x3,well appearing  HEENT- PERRL, EOMI, non injected sclera, pink conjunctiva, MMM, oropharynx clear Neck- Supple, no thyromegaly CVS- RRR, 3/6 systolic murmur RESP-CTAB ABD-NABS,soft,NT,ND PSYCH- normal affect and mood NEURO-CNII- XII in tact no focal deficits  EXT- No edema Pulses- Radial, DP- 2+        Assessment & Plan:      Problem List Items Addressed This Visit      Unprioritized    CAD (coronary artery disease)    History of coronary artery disease as well frequent PVCs.  We discussed coming off of the Cardizem altogether but he prefers to stay on a low dose of until he sees his cardiologist.  He is taking approximately 15 mg once a day.  He is also on low-dose metoprolol.  His blood pressure looks good here in the office.        Relevant Medications   NIFEdipine (ADALAT CC) 60 MG 24 hr tablet   Diabetes mellitus type II, controlled ([place])    Diabetes mellitus.  He has had a few hypoglycemic episodes will decrease his glimepiride to 0.5 mg once a day.  He was taking twice a day.  Continue Jardiance in the setting of his coronary artery disease. Obtain eye exam.      Relevant Medications   glimepiride (AMARYL) 1 MG tablet   Other Relevant Orders   HM DIABETES FOOT EXAM (Completed)   DIZZINESS, CHRONIC    Fasting labs reviewed at the bedside.  He has chronic kidney stones which are multifactorial but could be related to his blood pressure with a blood sugar.  Adjustments made per above       Essential hypertension - Primary   Relevant Medications   NIFEdipine (ADALAT CC) 60 MG 24 hr  tablet   Frequent PVCs   Relevant Medications   NIFEdipine (ADALAT CC) 60 MG 24 hr tablet    Other Visit Diagnoses    Need for immunization against influenza       Relevant Orders   Flu Vaccine QUAD High Dose(Fluad) (Completed)      Note: This dictation was prepared with Dragon dictation along with smaller phrase technology. Any transcriptional errors that result from this process are unintentional.

## 2020-12-15 NOTE — Patient Instructions (Signed)
Decrease amaryl to 1/2 tablet at lunch only  Continue the cardizem 15mg  once a day  No other changes Flu shot given F/U Dr Dennard Schaumann as scheduled

## 2020-12-15 NOTE — Assessment & Plan Note (Addendum)
Fasting labs reviewed at the bedside.  He has chronic kidney stones which are multifactorial but could be related to his blood pressure with a blood sugar.  Adjustments made per above

## 2020-12-15 NOTE — Assessment & Plan Note (Addendum)
Diabetes mellitus.  He has had a few hypoglycemic episodes will decrease his glimepiride to 0.5 mg once a day.  He was taking twice a day.  Continue Jardiance in the setting of his coronary artery disease. Obtain eye exam.

## 2020-12-26 NOTE — Progress Notes (Signed)
Chronic Care Management Pharmacy Note  01/01/2021 Name:  Barry [name] MRN:  [id number] DOB:  [birth year]  Subjective: Barry [name] is an 87 y.o. year old male who is a primary patient of [place], Modena Nunnery, MD.  The CCM team was consulted for assistance with disease management and care coordination needs.    Engaged with patient by telephone for follow up visit in response to provider referral for pharmacy case management and/or care coordination services.   Consent to Services:  The patient was given the following information about Chronic Care Management services today, agreed to services, and gave verbal consent: 1. CCM service includes personalized support from designated clinical staff supervised by the primary care provider, including individualized plan of care and coordination with other care providers 2. 24/7 contact phone numbers for assistance for urgent and routine care needs. 3. Service will only be billed when office clinical staff spend 20 minutes or more in a month to coordinate care. 4. Only one practitioner may furnish and bill the service in a calendar month. 5.The patient may stop CCM services at any time (effective at the end of the month) by phone call to the office staff. 6. The patient will be responsible for cost sharing (co-pay) of up to 20% of the service fee (after annual deductible is met). Patient agreed to services and consent obtained.  Patient Care Team: [facility], Modena Nunnery, MD as PCP - General (Family Medicine) Early, Arvilla Meres, MD (Vascular Surgery) Sherlynn Stalls, MD (Ophthalmology) Allyn Kenner, MD (Dermatology) Josue Hector, MD (Cardiology) Edythe Clarity, [facility] as Pharmacist (Pharmacist) Cloria Spring, MD as Consulting Physician [facility])  Recent office visits: 12/15/20 [facility]) - glimepiride was decreased to 0.64m once daily considering hypoglycemic episodes.  No other med changes.  All labs acceptable.  11/10/20 ([place]) - Pepcid  280mBID restarted for indigestion.  Try Singulair hs for sinusitis. Recent consult visits: 09/29/20 ([place], Cardio) - Procardia increased to 6017maily (TWO 80m35mblets)  Hospital visits: None in previous 6 months  Objective:  Lab Results  Component Value Date   CREATININE 1.06 12/12/2020   BUN 22 12/12/2020   GFR 76.82 07/10/2013   GFRNONAA >60 12/21/2018   GFRAA >60 12/21/2018   NA 141 12/12/2020   K 4.2 12/12/2020   CALCIUM 9.3 12/12/2020   CO2 31 12/12/2020    Lab Results  Component Value Date/Time   HGBA1C 6.7 (H) 12/12/2020 08:12 AM   HGBA1C 7.0 (H) 09/05/2020 09:27 AM   GFR 76.82 07/10/2013 12:13 PM   GFR 69.02 06/06/2012 10:45 AM   MICROALBUR 25.6 11/17/2017 08:38 AM   MICROALBUR 23.6 05/26/2017 08:01 AM    Last diabetic Eye exam:  Lab Results  Component Value Date/Time   HMDIABEYEEXA No Retinopathy 04/14/2017 12:00 AM    Last diabetic Foot exam:  Lab Results  Component Value Date/Time   HMDIABFOOTEX yes 04/29/2009 12:00 AM     Lab Results  Component Value Date   CHOL 132 12/12/2020   HDL 54 12/12/2020   LDLCALC 61 12/12/2020   LDLDIRECT 152.6 05/28/2008   TRIG 89 12/12/2020   CHOLHDL 2.4 12/12/2020    Hepatic Function Latest Ref Rng & Units 12/12/2020 09/05/2020 02/21/2020  Total Protein 6.1 - 8.1 g/dL 6.2 6.3 6.1  Albumin 3.5 - 5.0 g/dL - - -  AST 10 - 35 U/L _0 ALT 9 - 46 U/L _1 Alk Phosphatase 38 - 126 U/L - - -  Total Bilirubin 0.2 - 1.2 mg/dL 0.4 0.3 0.4  Bilirubin, Direct <=0.2 mg/dL - - -    Lab Results  Component Value Date/Time   TSH 1.266 12/21/2018 07:21 AM   TSH 2.071 10/27/2018 06:58 AM   TSH 2.080 02/05/2015 08:17 AM   TSH 1.59 06/06/2012 10:45 AM    CBC Latest Ref Rng & Units 12/12/2020 09/05/2020 02/21/2020  WBC 3.8 - 10.8 Thousand/uL 7.1 6.1 6.7  Hemoglobin 13.2 - 17.1 g/dL 16.1 14.9 15.5  Hematocrit 38.5 - 50.0 % 47.9 45.4 45.9  Platelets 140 - 400 Thousand/uL 264 243 231    No results found for:  VD25OH  Clinical ASCVD: Yes  The ASCVD Risk score Mikey Bussing DC Jr., et al., 2013) failed to calculate for the following reasons:   The 2013 ASCVD risk score is only valid for ages 52 to 54    Depression screen PHQ 2/9 11/10/2020 02/22/2020 02/22/2020  Decreased Interest 0 0 0  Down, Depressed, Hopeless 0 0 0  PHQ - 2 Score 0 0 0  Altered sleeping 2 0 -  Tired, decreased energy 1 0 -  Change in appetite 0 0 -  Feeling bad or failure about yourself  0 0 -  Trouble concentrating 0 0 -  Moving slowly or fidgety/restless 0 0 -  Suicidal thoughts 0 0 -  PHQ-9 Score 3 0 -  Difficult doing work/chores Not difficult at all Not difficult at all -  Some recent data might be hidden      Social History   Tobacco Use  Smoking Status Current Every Day Smoker  . Packs/day: 1.00  . Years: 60.00  . Pack years: 60.00  . Types: Cigarettes  Smokeless Tobacco Never Used  Tobacco Comment   smokes about 16-20 cigs a day as of 04/04/2013   BP Readings from Last 3 Encounters:  12/15/20 134/70  11/10/20 (!) 148/76  09/29/20 (!) 152/78   Pulse Readings from Last 3 Encounters:  12/15/20 64  11/10/20 94  09/29/20 72   Wt Readings from Last 3 Encounters:  12/15/20 169 lb (76.7 kg)  11/10/20 169 lb (76.7 kg)  09/29/20 172 lb (78 kg)    Assessment/Interventions: Review of patient past medical history, allergies, medications, health status, including review of consultants reports, laboratory and other test data, was performed as part of comprehensive evaluation and provision of chronic care management services.   SDOH:  (Social Determinants of Health) assessments and interventions performed: No   CCM Care Plan  Allergies  Allergen Reactions  . Lisinopril-Hydrochlorothiazide Swelling    ZESTRIL  . Doxycycline Rash    Medications Reviewed Today    Reviewed by Edythe Clarity, [facility] (Pharmacist) on 01/01/21 at 94  Med List Status: <None>  Medication Order Taking? Sig Documenting Provider Last  Dose Status Informant  aspirin EC 81 MG tablet [id number] Yes Take 81 mg by mouth every morning. [provider] Taking Active Self  cetirizine (ZYRTEC) 10 MG tablet [id number] Yes Take 1 tablet (10 mg total) by mouth daily. [place], Modena Nunnery, MD Taking Active   clobetasol (TEMOVATE) 0.05 % external solution [id number] Yes Apply 1 application topically daily as needed (dry scalp). [provider] Taking Active Self  clonazePAM (KLONOPIN) 0.5 MG tablet [id number] Yes Take 0.5 tablets (0.25 mg total) by mouth in the morning, at noon, in the evening, and at bedtime. Cloria Spring, MD Taking Active   empagliflozin (JARDIANCE) 10 MG TABS tablet [id number] Yes Take 1 tablet (10 mg total)  by mouth daily before breakfast. Alycia Rossetti, MD Taking Active   ezetimibe (ZETIA) 10 MG tablet [id number] Yes TAKE 1 TABLET BY MOUTH DAILY, NEED APPOINTMENT FOR FUTURE REFILL GENERIC EQUIVALENT FOR ZETIA Lelon Perla, MD Taking Active   famotidine (PEPCID) 20 MG tablet [id number] Yes Take 1 tablet (20 mg total) by mouth 2 (two) times daily. As needed [place], Modena Nunnery, MD Taking Active   FLUoxetine (PROZAC) 20 MG capsule [id number] Yes TAKE (1) CAPSULE BY MOUTH EACH MORNING. Cloria Spring, MD Taking Active   glimepiride [facility]) 1 MG tablet [id number] Yes Take 1/2 tablet once a day Alycia Rossetti, MD Taking Active   hydrochlorothiazide (HYDRODIURIL) 25 MG tablet [id number] Yes TAKE 1 TABLET BY MOUTH DAILY [place], Modena Nunnery, MD Taking Active   ibuprofen (ADVIL,MOTRIN) 200 MG tablet [id number] Yes Take 400 mg by mouth every 4 (four) hours as needed for mild pain. [provider] Taking Active Self  ipratropium (ATROVENT) 0.03 % nasal spray [id number] Yes Place 2 sprays into both nostrils every 12 (twelve) hours. [place], Modena Nunnery, MD Taking Active   metoprolol succinate (TOPROL-XL) 25 MG 24 hr tablet [id number] Yes TAKE ONE-HALF TABLET BY MOUTH DAILY. GENERIC EQUIVALENT FOR TOPROL XL. [place],  Modena Nunnery, MD Taking Active   montelukast (SINGULAIR) 10 MG tablet [id number] Yes Take 1 tablet (10 mg total) by mouth at bedtime. [place], Modena Nunnery, MD Taking Active   NIFEdipine (ADALAT CC) 60 MG 24 hr tablet [id number] No Take 71m once a day  Patient not taking: Reported on 01/01/2021   DAlycia Rossetti MD Not Taking Active   polyethylene glycol ([facility]/ GLYCOLAX) packet [id number] Take 17 g by mouth daily as needed for mild constipation.  [provider] Taking Active Self  ramipril (ALTACE) 10 MG capsule [id number] TAKE ONE CAPSULE BY MOUTH EVERY DAY. [place] KModena Nunnery MD Taking Active   sildenafil (VIAGRA) 100 MG tablet [id number] TAKE 1/2 TO 1 TABLET BY MOUTH DAILY AS NEEDED FOR ERECTILE DYSFUNCTION [place], KModena Nunnery MD Taking Active   simvastatin (ZOCOR) 40 MG tablet [id number] TAKE (1) TABLET BY MOUTH AT BEDTIME. DAlycia Rossetti MD Taking Active   vitamin B-12 (CYANOCOBALAMIN) 1000 MCG tablet [id number] Take 1,000 mcg by mouth daily. [provider] Taking Active Self          Patient Active Problem List   Diagnosis Date Noted  . Frequent PVCs 03/20/2020  . Aortic stenosis 03/20/2020  . COPD (chronic obstructive pulmonary disease) (HU[place] 03/20/2020  . Constipation 04/30/2019  . Protein-calorie malnutrition (HChrist[place] 04/30/2019  . Anxiety with depression 12/05/2018  . Seasonal and perennial allergic rhinitis 08/09/2018  . Carotid artery disease ([place] 11/18/2017  . Hx of colonic polyps 08/26/2015  . Erectile dysfunction 02/17/2015  . AAA (abdominal aortic aneurysm) without rupture ([place] 05/21/2014  . Chest pain 01/31/2014  . CAD (coronary artery disease) 12/24/2013  . HEARING LOSS, CONDUCTIVE, LEFT 02/23/2010  . DIZZINESS, CHRONIC 05/28/2008  . PULMONARY NODULE 09/07/2007  . Diabetes mellitus type II, controlled ([place] 09/06/2007  . Hyperlipidemia 09/06/2007  . TOBACCO ABUSE 09/06/2007  . Essential hypertension  09/06/2007  . Allergic rhinitis 09/06/2007  . GERD 09/06/2007  . BASAL CELL CARCINOMA, NOSE 09/06/2007    Immunization History  Administered Date(s) Administered  . Fluad Quad(high Dose 65+) 07/12/2019, 12/15/2020  . H1N1 10/15/2008  . Influenza Split 07/12/2011, 08/18/2012  . Influenza Whole 09/25/2007, 08/23/2008, 08/25/2010  . Influenza, High Dose Seasonal  PF 08/01/2013, 08/16/2014, 09/05/2018  . Influenza-Unspecified 08/05/2015, 08/28/2016, 09/02/2017  . Moderna Sars-Covid-2 Vaccination 11/19/2019, 12/17/2019, 11/18/2020  . Pneumococcal Conjugate-13 02/17/2015  . Td 06/06/2012    Conditions to be addressed/monitored:  HTN, CAD, GERD, Type II DM, HLD,  Depression/Anxiety.   Care Plan : General Pharmacy (Adult)  Updates made by Edythe Clarity, RPH since 01/01/2021 12:00 AM    Problem: HTN, HLD, Depression/Anxiety, GERD, Type II DM   Priority: High  Onset Date: 01/01/2021    Long-Range Goal: Patient-Specific Goal   Start Date: 01/01/2021  Expected End Date: 07/04/2021  This Visit's Progress: On track  Priority: High  Note:   Current Barriers:  . Unable to independently monitor therapeutic efficacy . Unable to maintain control of blood pressure  Pharmacist Clinical Goal(s):  Marland Kitchen Over the next 180 days, patient will achieve adherence to monitoring guidelines and medication adherence to achieve therapeutic efficacy . achieve control of blood pressure as evidenced by home monitoring . maintain control of blood sugar as evidenced by A1c/glucose   . adhere to prescribed medication regimen as evidenced by fill dates through collaboration with PharmD and provider.   Interventions: . 1:1 collaboration with [place]uel[place] Manis, Modena Nunnery, MD regarding development and update of comprehensive plan of care as evidenced by provider attestation and co-signature . Inter-disciplinary care team collaboration (see longitudinal plan of care) . Comprehensive medication review performed; medication  list updated in electronic medical record  Hypertension (BP goal <130/80) -Not ideally controlled -Current treatment:  HCTZ 71m  Metoprolol XL 260m Ramipril 1042mMedications previously tried: lisinopril/HCTZ (swelling), Nifedipine (pt d/c on his own)  -Current home readings: unavailable, pt not really checking -Denies hypotensive/hypertensive symptoms -Educated on BP goals and benefits of medications for prevention of heart attack, stroke and kidney damage; Importance of home blood pressure monitoring; Symptoms of hypotension and importance of maintaining adequate hydration; -Counseled to monitor BP at home at least a few times per week, document, and provide log at future appointments -He does not monitor often due to getting worked up over it -He stopped nifedipine on his own and his last office blood pressure was WNL.  He reports he was not taking it at this time. -Counseled him on need for home monitoring to adjust medications or monitor for increased/decreased BP/ -Recommended to continue current medication Recommended follow up with cardiology on nifedipine.  Hyperlipidemia/CAD: (LDL goal < 70) -Controlled -Current treatment:  Simvastatin 106m28mzetimibe 10mg76mdications previously tried: none noted  -Educated on Cholesterol goals;  Benefits of statin for ASCVD risk reduction;  -Continues to be adherent' -Most recent lipid panel excellent, congratulations on this! -Recommended to continue current medication  Diabetes (A1c goal <7%) -Controlled -Current medications:  Glimepiride 1mg -51mlf tablet daily  Jardiance 10mg d75m -Medications previously tried: metformin (headaches)  -Current home glucose readings . fasting glucose: 100-115 most mornings . post prandial glucose: none available -Denies hypoglycemic/hyperglycemic symptoms -Current meal patterns: reports a good balanced diet -Exercise - active with yard work for himself and neighbors -Educated on  A1c and blood sugar goals; Benefits of routine self-monitoring of blood sugar; -Counseled to check feet daily and get yearly eye exams -Denies hypoglycemia since decreased dose of glimepiride, continue to monitor -Recommended to continue current medication  Depression/Anxiety (Goal: Control symptoms) -Controlled -Current treatment: . Fluoxetine 20mg . 39mazepam 0.5mg prn 38mdications previously tried/failed: none noted -Educated on Benefits of medication for symptom control  -Pt reports his mood has been great lately, no issues with sleep -Recommended  to continue current medication  GERD (Goal: Minimize symptoms) -Controlled -Current treatment  . Famotidine 69m prn -Medications previously tried: none noted -No longer taking famotidine scheduled, just takes prn -Reports his symptoms are very rare -Recommended to continue current medication   Patient Goals/Self-Care Activities . Over the next 180 days, patient will:  - take medications as prescribed check glucose daily, document, and provide at future appointments check blood pressure at least a few times per week, document, and provide at future appointments  Follow Up Plan: The care management team will reach out to the patient again over the next 180 days.         Medication Assistance: None required.  Patient affirms current coverage meets needs.  Patient's [place] pharmacy [place]:  [id number]: [phone]: [phone] [facility] [place] [address]Phone: [phone]: [phone] Uses pill box? Yes Pt endorses 100% compliance  We discussed: Benefits of medication synchronization, packaging and delivery as well as enhanced pharmacist oversight with [employer]. Patient decided to: Continue current medication  management strategy  Care Plan and Follow Up Patient Decision:  Patient agrees to Care Plan and Follow-up.  Plan: The care management team will reach out to the patient again over the next 180 days.  CBeverly Milch PharmD Clinical Pharmacist [place]([phone]

## 2020-12-29 ENCOUNTER — Telehealth (HOSPITAL_COMMUNITY): Payer: Medicare Other | Admitting: Psychiatry

## 2021-01-01 ENCOUNTER — Ambulatory Visit (INDEPENDENT_AMBULATORY_CARE_PROVIDER_SITE_OTHER): Payer: Medicare Other | Admitting: Pharmacist

## 2021-01-01 DIAGNOSIS — E7800 Pure hypercholesterolemia, unspecified: Secondary | ICD-10-CM

## 2021-01-01 DIAGNOSIS — I2510 Atherosclerotic heart disease of native coronary artery without angina pectoris: Secondary | ICD-10-CM | POA: Diagnosis not present

## 2021-01-01 DIAGNOSIS — I10 Essential (primary) hypertension: Secondary | ICD-10-CM | POA: Diagnosis not present

## 2021-01-01 DIAGNOSIS — E119 Type 2 diabetes mellitus without complications: Secondary | ICD-10-CM

## 2021-01-01 NOTE — Patient Instructions (Addendum)
Visit Information  Goals Addressed            This Visit's Progress   . Track and Manage My Blood Pressure-Hypertension       Timeframe:  Long-Range Goal Priority:  High Start Date:   01/01/21                          Expected End Date:    07/04/21                   Follow Up Date 04/16/21   - check blood pressure 3 times per week - write blood pressure results in a log or diary    Why is this important?    You won't feel high blood pressure, but it can still hurt your blood vessels.   High blood pressure can cause heart or kidney problems. It can also cause a stroke.   Making lifestyle changes like losing a little weight or eating less salt will help.   Checking your blood pressure at home and at different times of the day can help to control blood pressure.   If the doctor prescribes medicine remember to take it the way the doctor ordered.   Call the office if you cannot afford the medicine or if there are questions about it.     Notes:       Patient Care Plan: General Pharmacy (Adult)    Problem Identified: HTN, HLD, Depression/Anxiety, GERD, Type II DM   Priority: High  Onset Date: 01/01/2021    Long-Range Goal: Patient-Specific Goal   Start Date: 01/01/2021  Expected End Date: 07/04/2021  This Visit's Progress: On track  Priority: High  Note:   Current Barriers:  . Unable to independently monitor therapeutic efficacy . Unable to maintain control of blood pressure  Pharmacist Clinical Goal(s):  Marland Kitchen Over the next 180 days, patient will achieve adherence to monitoring guidelines and medication adherence to achieve therapeutic efficacy . achieve control of blood pressure as evidenced by home monitoring . maintain control of blood sugar as evidenced by A1c/glucose   . adhere to prescribed medication regimen as evidenced by fill dates through collaboration with PharmD and provider.   Interventions: . 1:1 collaboration with Buelah Manis, Modena Nunnery, MD regarding  development and update of comprehensive plan of care as evidenced by provider attestation and co-signature . Inter-disciplinary care team collaboration (see longitudinal plan of care) . Comprehensive medication review performed; medication list updated in electronic medical record  Hypertension (BP goal <130/80) -Not ideally controlled -Current treatment:  HCTZ 25mg   Metoprolol XL 25mg   Ramipril 10mg  -Medications previously tried: lisinopril/HCTZ (swelling), Nifedipine (pt d/c on his own)  -Current home readings: unavailable, pt not really checking -Denies hypotensive/hypertensive symptoms -Educated on BP goals and benefits of medications for prevention of heart attack, stroke and kidney damage; Importance of home blood pressure monitoring; Symptoms of hypotension and importance of maintaining adequate hydration; -Counseled to monitor BP at home at least a few times per week, document, and provide log at future appointments -He does not monitor often due to getting worked up over it -He stopped nifedipine on his own and his last office blood pressure was WNL.  He reports he was not taking it at this time. -Counseled him on need for home monitoring to adjust medications or monitor for increased/decreased BP/ -Recommended to continue current medication Recommended follow up with cardiology on nifedipine.  Hyperlipidemia/CAD: (LDL goal < 70) -Controlled -Current treatment:  Simvastatin 40mg   Ezetimibe 10mg  -Medications previously tried: none noted  -Educated on Cholesterol goals;  Benefits of statin for ASCVD risk reduction;  -Continues to be adherent' -Most recent lipid panel excellent, congratulations on this! -Recommended to continue current medication  Diabetes (A1c goal <7%) -Controlled -Current medications:  Glimepiride 1mg  - half tablet daily  Jardiance 10mg  daily -Medications previously tried: metformin (headaches)  -Current home glucose readings . fasting  glucose: 100-115 most mornings . post prandial glucose: none available -Denies hypoglycemic/hyperglycemic symptoms -Current meal patterns: reports a good balanced diet -Exercise - active with yard work for himself and neighbors -Educated on A1c and blood sugar goals; Benefits of routine self-monitoring of blood sugar; -Counseled to check feet daily and get yearly eye exams -Denies hypoglycemia since decreased dose of glimepiride, continue to monitor -Recommended to continue current medication  Depression/Anxiety (Goal: Control symptoms) -Controlled -Current treatment: . Fluoxetine 20mg  . Clonazepam 0.5mg  prn -Medications previously tried/failed: none noted -Educated on Benefits of medication for symptom control  -Pt reports his mood has been great lately, no issues with sleep -Recommended to continue current medication  GERD (Goal: Minimize symptoms) -Controlled -Current treatment  . Famotidine 20mg  prn -Medications previously tried: none noted -No longer taking famotidine scheduled, just takes prn -Reports his symptoms are very rare -Recommended to continue current medication   Patient Goals/Self-Care Activities . Over the next 180 days, patient will:  - take medications as prescribed check glucose daily, document, and provide at future appointments check blood pressure at least a few times per week, document, and provide at future appointments  Follow Up Plan: The care management team will reach out to the patient again over the next 180 days.         The patient verbalized understanding of instructions, educational materials, and care plan provided today and agreed to receive a mailed copy of patient instructions, educational materials, and care plan.  Telephone follow up appointment with pharmacy team member scheduled for: 6 months  Edythe Clarity, [facility]  Diabetes Mellitus and [place] care is an important part of your health, especially when you have  diabetes. Diabetes may cause you to have problems because of poor blood flow (circulation) to your feet and legs, which can cause your skin to:  Become thinner and drier.  Break more easily.  Heal more slowly.  Peel and crack. You may also have nerve damage (neuropathy) in your legs and feet, causing decreased feeling in them. This means that you may not notice minor injuries to your feet that could lead to more serious problems. Noticing and addressing any potential problems early is the best way to prevent future foot problems. How to care for your feet Foot hygiene  Wash your feet daily with warm water and mild soap. Do not use hot water. Then, pat your feet and the areas between your toes until they are completely dry. Do not soak your feet as this can dry your skin.  Trim your toenails straight across. Do not dig under them or around the cuticle. File the edges of your nails with an emery board or nail file.  Apply a moisturizing lotion or petroleum jelly to the skin on your feet and to dry, brittle toenails. Use lotion that does not contain alcohol and is unscented. Do not apply lotion between your toes.   Shoes and socks  Wear clean socks or stockings every day. Make sure they are not too tight. Do not wear knee-high stockings since they may  decrease blood flow to your legs.  Wear shoes that fit properly and have enough cushioning. Always look in your shoes before you put them on to be sure there are no objects inside.  To break in new shoes, wear them for just a few hours a day. This prevents injuries on your feet. Wounds, scrapes, corns, and calluses  Check your feet daily for blisters, cuts, bruises, sores, and redness. If you cannot see the bottom of your feet, use a mirror or ask someone for help.  Do not cut corns or calluses or try to remove them with medicine.  If you find a minor scrape, cut, or break in the skin on your feet, keep it and the skin around it clean and  dry. You may clean these areas with mild soap and water. Do not clean the area with peroxide, alcohol, or iodine.  If you have a wound, scrape, corn, or callus on your foot, look at it several times a day to make sure it is healing and not infected. Check for: ? Redness, swelling, or pain. ? Fluid or blood. ? Warmth. ? Pus or a bad smell.   General tips  Do not cross your legs. This may decrease blood flow to your feet.  Do not use heating pads or hot water bottles on your feet. They may burn your skin. If you have lost feeling in your feet or legs, you may not know this is happening until it is too late.  Protect your feet from hot and cold by wearing shoes, such as at the beach or on hot pavement.  Schedule a complete foot exam at least once a year (annually) or more often if you have foot problems. Report any cuts, sores, or bruises to your health care provider immediately. Where to find more information  American Diabetes Association: www.diabetes.org  Association of Diabetes Care & Education Specialists: www.diabeteseducator.org Contact a health care provider if:  You have a medical condition that increases your risk of infection and you have any cuts, sores, or bruises on your feet.  You have an injury that is not healing.  You have redness on your legs or feet.  You feel burning or tingling in your legs or feet.  You have pain or cramps in your legs and feet.  Your legs or feet are numb.  Your feet always feel cold.  You have pain around any toenails. Get help right away if:  You have a wound, scrape, corn, or callus on your foot and: ? You have pain, swelling, or redness that gets worse. ? You have fluid or blood coming from the wound, scrape, corn, or callus. ? Your wound, scrape, corn, or callus feels warm to the touch. ? You have pus or a bad smell coming from the wound, scrape, corn, or callus. ? You have a fever. ? You have a red line going up your  leg. Summary  Check your feet every day for blisters, cuts, bruises, sores, and redness.  Apply a moisturizing lotion or petroleum jelly to the skin on your feet and to dry, brittle toenails.  Wear shoes that fit properly and have enough cushioning.  If you have foot problems, report any cuts, sores, or bruises to your health care provider immediately.  Schedule a complete foot exam at least once a year (annually) or more often if you have foot problems. This information is not intended to replace advice given to you by your health care provider. Make  sure you discuss any questions you have with your health care provider. Document Revised: 04/24/2020 Document Reviewed: 04/24/2020 Elsevier Patient Education  [place].

## 2021-01-22 DIAGNOSIS — C44319 Basal cell carcinoma of skin of other parts of face: Secondary | ICD-10-CM | POA: Diagnosis not present

## 2021-01-23 DIAGNOSIS — E860 Dehydration: Secondary | ICD-10-CM | POA: Diagnosis not present

## 2021-01-23 DIAGNOSIS — J019 Acute sinusitis, unspecified: Secondary | ICD-10-CM | POA: Diagnosis not present

## 2021-01-23 DIAGNOSIS — J069 Acute upper respiratory infection, unspecified: Secondary | ICD-10-CM | POA: Diagnosis not present

## 2021-02-04 ENCOUNTER — Other Ambulatory Visit: Payer: Self-pay | Admitting: Family Medicine

## 2021-02-06 ENCOUNTER — Other Ambulatory Visit: Payer: Self-pay | Admitting: Family Medicine

## 2021-02-09 ENCOUNTER — Telehealth: Payer: Self-pay | Admitting: Family Medicine

## 2021-02-13 ENCOUNTER — Ambulatory Visit (INDEPENDENT_AMBULATORY_CARE_PROVIDER_SITE_OTHER): Payer: Medicare Other | Admitting: Family Medicine

## 2021-02-13 ENCOUNTER — Encounter: Payer: Self-pay | Admitting: Family Medicine

## 2021-02-13 ENCOUNTER — Other Ambulatory Visit: Payer: Self-pay

## 2021-02-13 VITALS — BP 180/96 | HR 88 | Temp 98.6°F | Resp 16 | Ht 70.0 in | Wt 170.0 lb

## 2021-02-13 DIAGNOSIS — I350 Nonrheumatic aortic (valve) stenosis: Secondary | ICD-10-CM

## 2021-02-13 DIAGNOSIS — J321 Chronic frontal sinusitis: Secondary | ICD-10-CM

## 2021-02-13 DIAGNOSIS — R42 Dizziness and giddiness: Secondary | ICD-10-CM

## 2021-02-13 DIAGNOSIS — I6523 Occlusion and stenosis of bilateral carotid arteries: Secondary | ICD-10-CM

## 2021-02-13 MED ORDER — AZITHROMYCIN 250 MG PO TABS: ORAL_TABLET | ORAL | 0 refills | Status: DC

## 2021-02-13 NOTE — Progress Notes (Addendum)
Subjective:    Patient ID: Barry [name], male    DOB: Nov 28, 1932, 87 y.o.   MRN: [id number]  HPI  Patient has previously been seen my partner.  He presents today with a 23-monthhistory of sinus headaches.  He states that he gets sinuses every year.  He reports pain and pressure in both frontal sinuses bilaterally.  He reports congestion and postnasal drip.  He states that he usually takes a Z-Pak.  He states that amoxicillin and cephalosporins did not help him.  He has been using Flonase and Zyrtec without benefit.  He also complains of disequilibrium.  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  He states that throughout his entire life, if he walks into a dark room or close to his eyes removing his visual input, he would fall and experience vertigo.  This to me sounds like a inner ear process.  He had an MRI of the brain in 2020 that revealed no stroke or central abnormality.  He had carotid Dopplers that showed carotid artery stenosis that was not at a critical level.  Blood flow in the vertebral arteries with antegrade.  He is not had an MRA of the head or neck to evaluate for intracranial blood flow.  Certainly compromise posterior circulation potentially could cause disequilibrium and balance issues.  Of note he is also on Klonopin chronically for anxiety. Past Medical History:  Diagnosis Date  . AAA (abdominal aortic aneurysm) (HCC) 3-09   4.7 cm  . Allergy    rhinitis  . Anxiety   . Cancer ([place]    skin  . Cataract   . Depression   . Diabetes mellitus   . Diverticulosis   . GERD (gastroesophageal reflux disease)   . Hepatitis   . Hx of colonic polyps 08/26/2015  . Hyperlipidemia   . Hypertension    . Nephrolithiasis   . Personal history of colonic polyps    adenomas, serrated also  . PVD (peripheral vascular disease) ([facility]   . Tobacco abuse    Past Surgical History:  Procedure Laterality Date  . COLONOSCOPY  2006, 2008, 2010, 05/27/2011   numerous adenomas - 13 in 2006, 3, 2008, 2 2012 (up to 1 cm), 4 diminutive adenomas, serrated adenomas 2012. Diverticulosis and hemorrhoids also.  .Marland KitchenEYE SURGERY    . EYE SURGERY Left   . Kidney stone operation    . lower limb amputation, other toe 5th    . percutaneous stent graft repair of infrarenal AAA  11/10   5.6 cm (T. early)  . surgical excision basal cell carcinoma    . tympanic eardrum repair    . VASECTOMY     Current Outpatient Medications on File Prior to Visit  Medication Sig Dispense Refill  . aspirin EC 81 MG tablet Take 81 mg by mouth every morning.    . cetirizine (ZYRTEC) 10 MG tablet Take 1 tablet (10 mg total) by mouth daily. 90 tablet 2  . clobetasol (TEMOVATE) 0.05 % external solution Apply 1 application topically daily as needed (dry scalp).    . clonazePAM (KLONOPIN) 0.5 MG tablet Take 0.5 tablets (0.25 mg  total) by mouth in the morning, at noon, in the evening, and at bedtime. 60 tablet 3  . empagliflozin (JARDIANCE) 10 MG TABS tablet Take 1 tablet (10 mg total) by mouth daily before breakfast. 90 tablet 3  . ezetimibe (ZETIA) 10 MG tablet TAKE 1 TABLET BY MOUTH DAILY, NEED APPOINTMENT FOR FUTURE REFILL GENERIC EQUIVALENT FOR ZETIA 90 tablet 1  . FLUoxetine (PROZAC) 20 MG capsule TAKE (1) CAPSULE BY MOUTH EACH MORNING. 90 capsule 2  . glimepiride (AMARYL) 1 MG tablet Take 1/2 tablet once a day 45 tablet 1  . hydrochlorothiazide (HYDRODIURIL) 25 MG tablet TAKE 1 TABLET BY MOUTH DAILY 90 tablet 1  . ibuprofen (ADVIL,MOTRIN) 200 MG tablet Take 400 mg by mouth every 4 (four) hours as needed for mild pain.    Marland Kitchen ipratropium (ATROVENT) 0.03 % nasal spray Place 2 sprays into both nostrils every 12 (twelve) hours. 30 mL 12  .  metoprolol succinate (TOPROL-XL) 25 MG 24 hr tablet TAKE ONE-HALF TABLET BY MOUTH DAILY. GENERIC EQUIVALENT FOR TOPROL XL. 45 tablet 0  . montelukast (SINGULAIR) 10 MG tablet Take 1 tablet (10 mg total) by mouth at bedtime. 90 tablet 1  . NIFEdipine (ADALAT CC) 60 MG 24 hr tablet Take 48m once a day 90 tablet 3  . polyethylene glycol (MIRALAX / GLYCOLAX) packet Take 17 g by mouth daily as needed for mild constipation.     . ramipril (ALTACE) 10 MG capsule TAKE ONE CAPSULE BY MOUTH EVERY DAY. GENERIC EQUIVALENT FOR ALTACE 90 capsule 3  . sildenafil (VIAGRA) 100 MG tablet TAKE 1/2 TO 1 TABLET BY MOUTH DAILY AS NEEDED FOR ERECTILE DYSFUNCTION 6 tablet 0  . simvastatin (ZOCOR) 40 MG tablet TAKE (1) TABLET BY MOUTH AT BEDTIME. 90 tablet 0  . vitamin B-12 (CYANOCOBALAMIN) 1000 MCG tablet Take 1,000 mcg by mouth daily.     No current facility-administered medications on file prior to visit.   Allergies  Allergen Reactions  . Lisinopril-Hydrochlorothiazide Swelling    ZESTRIL  . Doxycycline Rash   Social History   Socioeconomic History  . Marital status: Widowed    Spouse name: Not on file  . Number of children: 2  . Years of education: Not on file  . Highest education level: Not on file  Occupational History    Employer: RETIRED  Tobacco Use  . Smoking status: Current Every Day Smoker    Packs/day: 1.00    Years: 60.00    Pack years: 60.00    Types: Cigarettes  . Smokeless tobacco: Never Used  . Tobacco comment: smokes about 16-20 cigs a day as of 04/04/2013  Vaping Use  . Vaping Use: Never used  Substance and Sexual Activity  . Alcohol use: No  . Drug use: No  . Sexual activity: Yes    Partners: Female    Birth control/protection: Surgical    Comment: vasectomy  Other Topics Concern  . 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 retired 145years - keeping busy. Lives alone. 1- step-daughter, 1 son - '68; 3 grandchildren. does odd-jobs,  yard work   SScientist, physiologicalStrain: Low Risk   . Difficulty of Paying Living Expenses: Not very hard  Food Insecurity: Not on file  Transportation Needs: Not on file  Physical Activity: Not on file  Stress: Not on file  Social Connections: Not on file  Intimate Partner Violence: Not on file  Review of Systems  All other systems reviewed and are negative.      Objective:   Physical Exam Vitals reviewed.  Constitutional:      General: He is not in acute distress.    Appearance: He is not toxic-appearing.  HENT:     Right Ear: Tympanic membrane and ear canal normal.     Left Ear: Tympanic membrane is injected and scarred.     Nose: Congestion and rhinorrhea present.  Cardiovascular:     Rate and Rhythm: Normal rate.     Heart sounds: Murmur heard.    Pulmonary:     Effort: Pulmonary effort is normal.     Breath sounds: Normal breath sounds. No wheezing.  Musculoskeletal:     Right lower leg: No edema.     Left lower leg: No edema.  Neurological:     General: No focal deficit present.     Mental Status: He is alert and oriented to person, place, and time.     Cranial Nerves: No cranial nerve deficit.     Sensory: No sensory deficit.     Coordination: Coordination normal.     Gait: Gait abnormal.     Deep Tendon Reflexes: Reflexes normal.           Assessment & Plan:  Chronic frontal sinusitis  DIZZINESS, CHRONIC  Aortic valve stenosis, etiology of cardiac valve disease unspecified  Bilateral carotid artery stenosis  I will treat his chronic sinusitis with Z-Pak.  Recommend he continue Flonase and Zyrtec as well.  I believe his chronic dizziness is multifactorial.  I believe some of it could be due to medication and advanced age so I recommended that he reduce his dose of Klonopin to see if the dizziness improves.  I am also concerned about possible decreased cerebral perfusion contributing to daily dizziness.  If the  dizziness does not improve on less Klonopin, I would recommend an MRA of the head to evaluate for compromise posterior circulation.  Carotid Dopplers are monitored by his cardiologist due to his history of aortic valve stenosis and bilateral carotid artery stenosis.  I also believe some of his dizziness could be due to damage to the middle ear of his left ear which perhaps explains the dependence on vision to help prevent vertigo as he explained in the history of present illness.  I reviewed the MRI of his brain from 2 years ago.  However I do believe the patient would benefit from an MRA of the head.  He defers that now and would like to try to reduce the dose of Klonopin first.  Blood pressure is extremely high today however the patient states that he has whitecoat syndrome and he is extremely nervous to be here having never met be before.  He states that his blood pressure typically is running around 381 in the systolic range over 70 diastolic.  He is convinced that he has whitecoat syndrome.  He states that he will monitor his blood pressure daily at home and notify me if the blood pressure is higher than 140/90

## 2021-02-23 ENCOUNTER — Telehealth: Payer: Self-pay | Admitting: Family Medicine

## 2021-02-23 DIAGNOSIS — I6523 Occlusion and stenosis of bilateral carotid arteries: Secondary | ICD-10-CM

## 2021-02-23 DIAGNOSIS — I6521 Occlusion and stenosis of right carotid artery: Secondary | ICD-10-CM

## 2021-02-23 DIAGNOSIS — G4452 New daily persistent headache (NDPH): Secondary | ICD-10-CM

## 2021-02-23 DIAGNOSIS — R42 Dizziness and giddiness: Secondary | ICD-10-CM

## 2021-02-23 DIAGNOSIS — I2510 Atherosclerotic heart disease of native coronary artery without angina pectoris: Secondary | ICD-10-CM

## 2021-02-23 NOTE — Telephone Encounter (Signed)
Patient called to follow up on last visit and to request referral to ENT specialist; still experiencing issues with being dizzy and off-balance. Please advise asap at [phone]. Patient requested a message on his vmail if he's not available when you call. Thank you!

## 2021-02-25 NOTE — Telephone Encounter (Signed)
Please advise 

## 2021-02-26 NOTE — Telephone Encounter (Signed)
Call placed to patient and patient made aware.   Agreeable to plan for MRA.   Orders placed.

## 2021-02-26 NOTE — Telephone Encounter (Signed)
Recommend MRA of head and neck to evaluate for deficits in cerebral perfusion.

## 2021-03-02 ENCOUNTER — Telehealth: Payer: Self-pay | Admitting: Pharmacist

## 2021-03-02 NOTE — Progress Notes (Addendum)
Chronic Care Management Pharmacy Assistant   Name: Barry [name]  MRN: [id number] DOB: Aug 13, 1933  Reason for Encounter: Disease State For HTN.   Conditions to be addressed/monitored: HTN, CAD, GERD, Type II DM, HLD,  Depression/Anxiety.  Recent office visits:  02/13/21 Dr. Dennard Schaumann For follow-Up. STARTED Azithromycin 250 mg. STOPPED Famotidine. Per note:The doctor recommended educe his dose of Klonopin to see if the dizziness improves. Referral placed for MRA.  Recent consult visits:  None since 01/01/21  Hospital visits:  None in previous 6 months  Medications: Outpatient Encounter Medications as of 03/02/2021  Medication Sig   aspirin EC 81 MG tablet Take 81 mg by mouth every morning.   azithromycin (ZITHROMAX) 250 MG tablet 2 tabs poqday1, 1 tab poqday 2-5   cetirizine (ZYRTEC) 10 MG tablet Take 1 tablet (10 mg total) by mouth daily.   clobetasol (TEMOVATE) 0.05 % external solution Apply 1 application topically daily as needed (dry scalp).   clonazePAM (KLONOPIN) 0.5 MG tablet Take 0.5 tablets (0.25 mg total) by mouth in the morning, at noon, in the evening, and at bedtime.   empagliflozin (JARDIANCE) 10 MG TABS tablet Take 1 tablet (10 mg total) by mouth daily before breakfast.   ezetimibe (ZETIA) 10 MG tablet TAKE 1 TABLET BY MOUTH DAILY, NEED APPOINTMENT FOR FUTURE REFILL GENERIC EQUIVALENT FOR ZETIA   FLUoxetine (PROZAC) 20 MG capsule TAKE (1) CAPSULE BY MOUTH EACH MORNING.   glimepiride (AMARYL) 1 MG tablet Take 1/2 tablet once a day   hydrochlorothiazide (HYDRODIURIL) 25 MG tablet TAKE 1 TABLET BY MOUTH DAILY   ibuprofen (ADVIL,MOTRIN) 200 MG tablet Take 400 mg by mouth every 4 (four) hours as needed for mild pain.   ipratropium (ATROVENT) 0.03 % nasal spray Place 2 sprays into both nostrils every 12 (twelve) hours.   metoprolol succinate (TOPROL-XL) 25 MG 24 hr tablet TAKE ONE-HALF TABLET BY MOUTH DAILY. GENERIC EQUIVALENT FOR TOPROL XL.   montelukast (SINGULAIR) 10  MG tablet Take 1 tablet (10 mg total) by mouth at bedtime.   NIFEdipine (ADALAT CC) 60 MG 24 hr tablet Take 15mg  once a day   polyethylene glycol (MIRALAX / GLYCOLAX) packet Take 17 g by mouth daily as needed for mild constipation.    ramipril (ALTACE) 10 MG capsule TAKE ONE CAPSULE BY MOUTH EVERY DAY. GENERIC EQUIVALENT FOR ALTACE   sildenafil (VIAGRA) 100 MG tablet TAKE 1/2 TO 1 TABLET BY MOUTH DAILY AS NEEDED FOR ERECTILE DYSFUNCTION   simvastatin (ZOCOR) 40 MG tablet TAKE (1) TABLET BY MOUTH AT BEDTIME.   vitamin B-12 (CYANOCOBALAMIN) 1000 MCG tablet Take 1,000 mcg by mouth daily.   No facility-administered encounter medications on file as of 03/02/2021.    Reviewed chart prior to disease state call. Spoke with patient regarding BP  Recent Office Vitals: BP Readings from Last 3 Encounters:  02/13/21 (!) 180/96  12/15/20 134/70  11/10/20 (!) 148/76   Pulse Readings from Last 3 Encounters:  02/13/21 88  12/15/20 64  11/10/20 94    Wt Readings from Last 3 Encounters:  02/13/21 170 lb (77.1 kg)  12/15/20 169 lb (76.7 kg)  11/10/20 169 lb (76.7 kg)     Kidney Function Lab Results  Component Value Date/Time   CREATININE 1.06 12/12/2020 08:12 AM   CREATININE 1.12 (H) 09/05/2020 09:27 AM   GFR 76.82 07/10/2013 12:13 PM   GFRNONAA >60 12/21/2018 07:04 AM   GFRNONAA 67 05/23/2018 08:11 AM   GFRAA >60 12/21/2018 07:04 AM   GFRAA  77 05/23/2018 08:11 AM    BMP Latest Ref Rng & Units 12/12/2020 09/05/2020 02/21/2020  Glucose 65 - 99 mg/dL 128(H) 144(H) 139(H)  BUN 7 - 25 mg/dL 22 23 18   Creatinine 0.70 - 1.11 mg/dL 1.06 1.12(H) 1.03  BUN/Creat Ratio 6 - 22 (calc) NOT APPLICABLE 21 NOT APPLICABLE  Sodium 671 - 146 mmol/L 141 139 141  Potassium 3.5 - 5.3 mmol/L 4.2 4.3 4.2  Chloride 98 - 110 mmol/L 102 103 102  CO2 20 - 32 mmol/L 31 29 31   Calcium 8.6 - 10.3 mg/dL 9.3 8.9 9.0    Current antihypertensive regimen:  Metoprolol 25 mg 1/2 tablet daily  Hydrochlorothiazide 25 mg 1  tablet daily  Ramipril 10 mg 1 capsule daily   How often are you checking your Blood Pressure? Patient stated he does not check his blood pressure.  Current home BP readings: Patient stated he does not check his blood pressure.  What recent interventions/DTPs have been made by any provider to improve Blood Pressure control since last CPP Visit: None.  Any recent hospitalizations or ED visits since last visit with CPP? Patient stated no.  What diet changes have been made to improve Blood Pressure Control?  Patient stated he eats a large amount vegetables. He makes most of his meals at home. He stated he does eat out sometimes.   What exercise is being done to improve your Blood Pressure Control?  Patient stated he hasn't been doing much since he hurt his right big toe.  Adherence Review: Is the patient currently on ACE/ARB medication? Ramipril 10 mg  Does the patient have >5 day gap between last estimated fill dates? Per misc rpts, no.  Star Rating Drugs: Empagliflozin 10 mg 30 DS 12/23/20, Glimepiride 1 mg 90 DS 02/02/21, Ramipril 10 mg 90 DS 02/04/21 Simvastatin 40 mg 90 DS 4/22/2  Follow-Up:Pharmacist Review   Charlann Lange, RMA Clinical Pharmacist Assistant [phone]  10 minutes spent in review, coordination, and documentation.  Reviewed by: Beverly Milch, PharmD Clinical Pharmacist [facility] [phone]

## 2021-03-03 DIAGNOSIS — E113293 Type 2 diabetes mellitus with mild nonproliferative diabetic retinopathy without macular edema, bilateral: Secondary | ICD-10-CM | POA: Diagnosis not present

## 2021-03-03 DIAGNOSIS — H35033 Hypertensive retinopathy, bilateral: Secondary | ICD-10-CM | POA: Diagnosis not present

## 2021-03-03 LAB — HM DIABETES EYE EXAM

## 2021-03-09 ENCOUNTER — Encounter: Payer: Self-pay | Admitting: Family Medicine

## 2021-03-09 ENCOUNTER — Ambulatory Visit (INDEPENDENT_AMBULATORY_CARE_PROVIDER_SITE_OTHER): Payer: Medicare Other | Admitting: Family Medicine

## 2021-03-09 ENCOUNTER — Other Ambulatory Visit: Payer: Self-pay

## 2021-03-09 VITALS — BP 144/70 | HR 58 | Temp 98.7°F | Resp 14 | Ht 70.0 in | Wt 167.0 lb

## 2021-03-09 DIAGNOSIS — I6523 Occlusion and stenosis of bilateral carotid arteries: Secondary | ICD-10-CM | POA: Diagnosis not present

## 2021-03-09 DIAGNOSIS — R42 Dizziness and giddiness: Secondary | ICD-10-CM

## 2021-03-09 NOTE — Progress Notes (Signed)
Subjective:    Patient ID: Barry [name], male    DOB: 06-Dec-1932, 87 y.o.   MRN: [id number]  HPI 02/13/21 Patient has previously been seen my partner.  He presents today with a 33-monthhistory of sinus headaches.  He states that he gets sinuses every year.  He reports pain and pressure in both frontal sinuses bilaterally.  He reports congestion and postnasal drip.  He states that he usually takes a Z-Pak.  He states that amoxicillin and cephalosporins did not help him.  He has been using Flonase and Zyrtec without benefit.  He also complains of disequilibrium.  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  He states that he is unable to hear in the left ear due to surgery and an injury that he suffered in his 334s  I feel that this could be affecting his balance.  He states that throughout his entire life, if he walks into a dark room or close to his eyes removing his visual input, he would fall and experience vertigo.  This to me sounds like a inner ear process.  He had an MRI of the brain in 2020 that revealed no stroke or central abnormality.  He had carotid Dopplers that showed carotid artery stenosis that was not at a critical level.  Blood flow in the vertebral arteries with antegrade.  He is not had an MRA of the head or neck to evaluate for intracranial blood flow.  Certainly compromise posterior circulation potentially could cause disequilibrium and balance issues.  Of note he is also on Klonopin chronically for anxiety.  At that time, my plan was: I will treat his chronic sinusitis with Z-Pak.  Recommend he continue Flonase and Zyrtec as well.  I believe his chronic dizziness is multifactorial.  I believe some of it could be due to medication and advanced age so I recommended that he reduce his dose of Klonopin to see if the dizziness improves.  I am also concerned about  possible decreased cerebral perfusion contributing to daily dizziness.  If the dizziness does not improve on less Klonopin, I would recommend an MRA of the head to evaluate for compromise posterior circulation.  Carotid Dopplers are monitored by his cardiologist due to his history of aortic valve stenosis and bilateral carotid artery stenosis.  I also believe some of his dizziness could be due to damage to the middle ear of his left ear which perhaps explains the dependence on vision to help prevent vertigo as he explained in the history of present illness.  I reviewed the MRI of his brain from 2 years ago.  However I do believe the patient would benefit from an MRA of the head.  He defers that now and would like to try to reduce the dose of Klonopin first.  Blood pressure is extremely high today however the patient states that he has whitecoat syndrome and he is extremely nervous to be here having never met be before.  He states that his blood pressure typically is running around 1594in the systolic range over 70 diastolic.  He is convinced that he has whitecoat syndrome.  He states that he will monitor his blood pressure daily at home and notify me if the blood pressure is higher than 140/90  03/09/21 Patient continues to report dizziness.  He states that if he closes  his eyes while he is standing, he will fall down and loses balance.  He states that if the lights go out or he is in a dark room, he is unable to maintain his balance and he will fall.  He has chronic vertigo.  He states that he had vertigo ever since he was in his 59s.  Treating his chronic sinusitis did not seem to help his dizziness.  However he attributes his "dizziness" to the changes in the seasons.  He states it is always worse this time a year when he has sinus issues.  I have suggested that we get an MRA to evaluate posterior circulation for possible vertebrobasilar insufficiency. Past Medical History:  Diagnosis Date  . AAA (abdominal  aortic aneurysm) (HCC) 3-09   4.7 cm  . Allergy    rhinitis  . Anxiety   . Cancer ([place])    skin  . Cataract   . Depression   . Diabetes mellitus   . Diverticulosis   . GERD (gastroesophageal reflux disease)   . Hepatitis   . Hx of colonic polyps 08/26/2015  . Hyperlipidemia   . Hypertension   . Nephrolithiasis   . Personal history of colonic polyps    adenomas, serrated also  . PVD (peripheral vascular disease) ([place])   . Tobacco abuse    Past Surgical History:  Procedure Laterality Date  . COLONOSCOPY  2006, 2008, 2010, 05/27/2011   numerous adenomas - 13 in 2006, 3, 2008, 2 2012 (up to 1 cm), 4 diminutive adenomas, serrated adenomas 2012. Diverticulosis and hemorrhoids also.  Marland Kitchen EYE SURGERY    . EYE SURGERY Left   . Kidney stone operation    . lower limb amputation, other toe 5th    . percutaneous stent graft repair of infrarenal AAA  11/10   5.6 cm (T. early)  . surgical excision basal cell carcinoma    . tympanic eardrum repair    . VASECTOMY     Current Outpatient Medications on File Prior to Visit  Medication Sig Dispense Refill  . aspirin EC 81 MG tablet Take 81 mg by mouth every morning.    . cetirizine (ZYRTEC) 10 MG tablet Take 1 tablet (10 mg total) by mouth daily. 90 tablet 2  . clonazePAM (KLONOPIN) 0.5 MG tablet Take 0.5 tablets (0.25 mg total) by mouth in the morning, at noon, in the evening, and at bedtime. 60 tablet 3  . ezetimibe (ZETIA) 10 MG tablet TAKE 1 TABLET BY MOUTH DAILY, NEED APPOINTMENT FOR FUTURE REFILL GENERIC EQUIVALENT FOR ZETIA 90 tablet 1  . FLUoxetine (PROZAC) 20 MG capsule TAKE (1) CAPSULE BY MOUTH EACH MORNING. 90 capsule 2  . glimepiride (AMARYL) 1 MG tablet Take 1/2 tablet once a day 45 tablet 1  . hydrochlorothiazide (HYDRODIURIL) 25 MG tablet TAKE 1 TABLET BY MOUTH DAILY 90 tablet 1  . ibuprofen (ADVIL,MOTRIN) 200 MG tablet Take 400 mg by mouth every 4 (four) hours as needed for mild pain.    Marland Kitchen ipratropium (ATROVENT) 0.03 % nasal spray  Place 2 sprays into both nostrils every 12 (twelve) hours. 30 mL 12  . metoprolol succinate (TOPROL-XL) 25 MG 24 hr tablet TAKE ONE-HALF TABLET BY MOUTH DAILY. GENERIC EQUIVALENT FOR TOPROL XL. 45 tablet 0  . montelukast (SINGULAIR) 10 MG tablet Take 1 tablet (10 mg total) by mouth at bedtime. 90 tablet 1  . NIFEdipine (ADALAT CC) 60 MG 24 hr tablet Take 35m once a day 90 tablet 3  . polyethylene  glycol (MIRALAX / GLYCOLAX) packet Take 17 g by mouth daily as needed for mild constipation.     . ramipril (ALTACE) 10 MG capsule TAKE ONE CAPSULE BY MOUTH EVERY DAY. GENERIC EQUIVALENT FOR ALTACE 90 capsule 3  . sildenafil (VIAGRA) 100 MG tablet TAKE 1/2 TO 1 TABLET BY MOUTH DAILY AS NEEDED FOR ERECTILE DYSFUNCTION 6 tablet 0  . simvastatin (ZOCOR) 40 MG tablet TAKE (1) TABLET BY MOUTH AT BEDTIME. 90 tablet 0  . vitamin B-12 (CYANOCOBALAMIN) 1000 MCG tablet Take 1,000 mcg by mouth daily.     No current facility-administered medications on file prior to visit.   Allergies  Allergen Reactions  . Lisinopril-Hydrochlorothiazide Swelling    ZESTRIL  . Doxycycline Rash   Social History   Socioeconomic History  . Marital status: Widowed    Spouse name: Not on file  . Number of children: 2  . Years of education: Not on file  . Highest education level: Not on file  Occupational History    Employer: RETIRED  Tobacco Use  . Smoking status: Current Every Day Smoker    Packs/day: 1.00    Years: 60.00    Pack years: 60.00    Types: Cigarettes  . Smokeless tobacco: Never Used  . Tobacco comment: smokes about 16-20 cigs a day as of 04/04/2013  Vaping Use  . Vaping Use: Never used  Substance and Sexual Activity  . Alcohol use: No  . Drug use: No  . Sexual activity: Yes    Partners: Female    Birth control/protection: Surgical    Comment: vasectomy  Other Topics Concern  . 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 retired 21 years - keeping busy. Lives  alone. 1- step-daughter, 1 son - '68; 3 grandchildren. 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 Difficulty of Paying Living Expenses: Not very hard  Food Insecurity: Not on file  Transportation Needs: Not on file  Physical Activity: Not on file  Stress: Not on file  Social Connections: Not on file  Intimate Partner Violence: Not on file     Review of Systems  All other systems reviewed and are negative.      Objective:   Physical Exam Vitals reviewed.  Constitutional:      General: He is not in acute distress.    Appearance: He is not toxic-appearing.  HENT:     Right Ear: Tympanic membrane and ear canal normal.     Left Ear: Tympanic membrane is injected and scarred.  Cardiovascular:     Rate and Rhythm: Normal rate.     Heart sounds: Murmur heard.    Pulmonary:     Effort: Pulmonary effort is normal.     Breath sounds: Normal breath sounds. No wheezing.  Musculoskeletal:     Right lower leg: No edema.     Left lower leg: No edema.  Neurological:     General: No focal deficit present.     Mental Status: He is alert and oriented to person, place, and time.     Cranial Nerves: No cranial nerve deficit.     Sensory: No sensory deficit.     Coordination: Coordination normal.     Gait: Gait abnormal.     Deep Tendon Reflexes: Reflexes normal.           Assessment & Plan:  Dizziness and giddiness  Bilateral carotid  artery stenosis  Dizzy spells  I have recommended that we get an MRA of the head and neck to evaluate for vertebrobasilar insufficiency as a cause of his chronic dizziness.  Patient has an appointment for this coming up.  I will await the results of the MRA.  I reviewed his lab work from February which was outstanding.  His A1c was acceptable at 6.7 and his LDL cholesterol was well below 100.  Therefore at the present time I will make no changes in his medication.  However my nurse corrected  his medication list as he was not taking Jardiance.  He is only been taking half a tablet of glimepiride twice a day regarding his diabetes

## 2021-03-11 ENCOUNTER — Other Ambulatory Visit: Payer: Self-pay | Admitting: *Deleted

## 2021-03-11 DIAGNOSIS — E119 Type 2 diabetes mellitus without complications: Secondary | ICD-10-CM

## 2021-03-11 DIAGNOSIS — E7800 Pure hypercholesterolemia, unspecified: Secondary | ICD-10-CM

## 2021-03-11 DIAGNOSIS — I10 Essential (primary) hypertension: Secondary | ICD-10-CM

## 2021-03-11 DIAGNOSIS — E441 Mild protein-calorie malnutrition: Secondary | ICD-10-CM

## 2021-03-11 MED ORDER — GLIMEPIRIDE 1 MG PO TABS: 0.5000 mg | ORAL_TABLET | Freq: Two times a day (BID) | ORAL | 1 refills | Status: DC

## 2021-03-13 ENCOUNTER — Ambulatory Visit (HOSPITAL_COMMUNITY)
Admission: RE | Admit: 2021-03-13 | Discharge: 2021-03-13 | Disposition: A | Discharge status: Home / Self Care | Payer: Medicare Other | Source: Ambulatory Visit | Attending: Family Medicine | Admitting: Family Medicine

## 2021-03-13 ENCOUNTER — Other Ambulatory Visit: Payer: Self-pay | Admitting: Family Medicine

## 2021-03-13 ENCOUNTER — Other Ambulatory Visit: Payer: Self-pay

## 2021-03-13 DIAGNOSIS — I2510 Atherosclerotic heart disease of native coronary artery without angina pectoris: Secondary | ICD-10-CM | POA: Insufficient documentation

## 2021-03-13 DIAGNOSIS — I6523 Occlusion and stenosis of bilateral carotid arteries: Secondary | ICD-10-CM | POA: Insufficient documentation

## 2021-03-13 DIAGNOSIS — I708 Atherosclerosis of other arteries: Secondary | ICD-10-CM | POA: Diagnosis not present

## 2021-03-13 DIAGNOSIS — J3489 Other specified disorders of nose and nasal sinuses: Secondary | ICD-10-CM | POA: Diagnosis not present

## 2021-03-13 DIAGNOSIS — I771 Stricture of artery: Secondary | ICD-10-CM | POA: Diagnosis not present

## 2021-03-13 DIAGNOSIS — I6521 Occlusion and stenosis of right carotid artery: Secondary | ICD-10-CM

## 2021-03-13 DIAGNOSIS — R42 Dizziness and giddiness: Secondary | ICD-10-CM | POA: Insufficient documentation

## 2021-03-13 DIAGNOSIS — G4452 New daily persistent headache (NDPH): Secondary | ICD-10-CM | POA: Insufficient documentation

## 2021-03-13 DIAGNOSIS — I739 Peripheral vascular disease, unspecified: Secondary | ICD-10-CM | POA: Diagnosis not present

## 2021-03-13 MED ORDER — GADOBUTROL 1 MMOL/ML IV SOLN
7.0000 mL | Freq: Once | INTRAVENOUS | Status: AC | PRN
  Administered 2021-03-13: 7 mL via INTRAVENOUS

## 2021-03-17 ENCOUNTER — Other Ambulatory Visit (HOSPITAL_COMMUNITY): Payer: Self-pay | Admitting: Psychiatry

## 2021-03-19 ENCOUNTER — Other Ambulatory Visit: Payer: Self-pay

## 2021-03-19 ENCOUNTER — Other Ambulatory Visit: Payer: Medicare Other

## 2021-03-19 DIAGNOSIS — I10 Essential (primary) hypertension: Secondary | ICD-10-CM

## 2021-03-19 DIAGNOSIS — I2510 Atherosclerotic heart disease of native coronary artery without angina pectoris: Secondary | ICD-10-CM | POA: Diagnosis not present

## 2021-03-19 DIAGNOSIS — E7800 Pure hypercholesterolemia, unspecified: Secondary | ICD-10-CM

## 2021-03-19 DIAGNOSIS — E119 Type 2 diabetes mellitus without complications: Secondary | ICD-10-CM | POA: Diagnosis not present

## 2021-03-20 LAB — CBC WITH DIFFERENTIAL/PLATELET
Absolute Monocytes: 816 cells/uL (ref 200–950)
Basophils Absolute: 41 cells/uL (ref 0–200)
Basophils Relative: 0.6 %
Eosinophils Absolute: 150 cells/uL (ref 15–500)
Eosinophils Relative: 2.2 %
HCT: 46.2 % (ref 38.5–50.0)
Hemoglobin: 15.3 g/dL (ref 13.2–17.1)
Lymphs Abs: 2196 cells/uL (ref 850–3900)
MCH: 30.8 pg (ref 27.0–33.0)
MCHC: 33.1 g/dL (ref 32.0–36.0)
MCV: 93.1 fL (ref 80.0–100.0)
MPV: 10.6 fL (ref 7.5–12.5)
Monocytes Relative: 12 %
Neutro Abs: 3597 cells/uL (ref 1500–7800)
Neutrophils Relative %: 52.9 %
Platelets: 206 10*3/uL (ref 140–400)
RBC: 4.96 10*6/uL (ref 4.20–5.80)
RDW: 13.4 % (ref 11.0–15.0)
Total Lymphocyte: 32.3 %
WBC: 6.8 10*3/uL (ref 3.8–10.8)

## 2021-03-20 LAB — MICROALBUMIN / CREATININE URINE RATIO
Creatinine, Urine: 116 mg/dL (ref 20–320)
Microalb Creat Ratio: 2008 mcg/mg creat — ABNORMAL HIGH (ref <30)
Microalb, Ur: 232.9 mg/dL

## 2021-03-20 LAB — COMPLETE METABOLIC PANEL WITH GFR
AG Ratio: 1.5 (calc) (ref 1.0–2.5)
ALT: 12 U/L (ref 9–46)
AST: 18 U/L (ref 10–35)
Albumin: 3.5 g/dL — ABNORMAL LOW (ref 3.6–5.1)
Alkaline phosphatase (APISO): 41 U/L (ref 35–144)
BUN/Creatinine Ratio: 24 (calc) — ABNORMAL HIGH (ref 6–22)
BUN: 28 mg/dL — ABNORMAL HIGH (ref 7–25)
CO2: 30 mmol/L (ref 20–32)
Calcium: 8.8 mg/dL (ref 8.6–10.3)
Chloride: 107 mmol/L (ref 98–110)
Creat: 1.16 mg/dL — ABNORMAL HIGH (ref 0.70–1.11)
GFR, Est African American: 66 mL/min/{1.73_m2} (ref >=60)
GFR, Est Non African American: 57 mL/min/{1.73_m2} — ABNORMAL LOW (ref >=60)
Globulin: 2.3 g/dL (calc) (ref 1.9–3.7)
Glucose, Bld: 125 mg/dL — ABNORMAL HIGH (ref 65–99)
Potassium: 4.6 mmol/L (ref 3.5–5.3)
Sodium: 141 mmol/L (ref 135–146)
Total Bilirubin: 0.3 mg/dL (ref 0.2–1.2)
Total Protein: 5.8 g/dL — ABNORMAL LOW (ref 6.1–8.1)

## 2021-03-20 LAB — LIPID PANEL
Cholesterol: 128 mg/dL (ref <200)
HDL: 52 mg/dL (ref >=40)
LDL Cholesterol (Calc): 58 mg/dL (calc)
Non-HDL Cholesterol (Calc): 76 mg/dL (calc) (ref <130)
Total CHOL/HDL Ratio: 2.5 (calc) (ref <5.0)
Triglycerides: 99 mg/dL (ref <150)

## 2021-03-20 LAB — HEMOGLOBIN A1C
Hgb A1c MFr Bld: 6.9 % of total Hgb — ABNORMAL HIGH (ref <5.7)
Mean Plasma Glucose: 151 mg/dL
eAG (mmol/L): 8.4 mmol/L

## 2021-03-24 ENCOUNTER — Other Ambulatory Visit: Payer: Self-pay | Admitting: *Deleted

## 2021-03-24 MED ORDER — DAPAGLIFLOZIN PROPANEDIOL 5 MG PO TABS: 5.0000 mg | ORAL_TABLET | Freq: Every day | ORAL | 3 refills | Status: DC

## 2021-03-26 ENCOUNTER — Telehealth: Payer: Self-pay | Admitting: *Deleted

## 2021-03-26 NOTE — Telephone Encounter (Signed)
Received request from pharmacy for [place] on Farxiga.   PA submitted.   Dx: E11.65- DM with hyperglycemia, R80.9- proteinuria  Your information has been submitted to [place]. [employer] [place] will review the request and notify you of the determination decision directly, typically within 3 business days of your submission and once all necessary information is received.  You will also receive your request decision electronically. To check for an update later, open the request again from your dashboard.  If [employer] [facility] has not responded within the specified timeframe or if you have any questions about your PA submission, contact [facility] directly at [facility]) [phone] or ([place]) [phone].

## 2021-03-27 NOTE — Telephone Encounter (Signed)
Received call from [facility].   Reports that PA approved for Farxiga 03/26/2021- 03/26/2022.  States that patient has deductible and current co-pay is $245.   Patient is unable to afford this co-pay.   Please advise.

## 2021-03-30 ENCOUNTER — Other Ambulatory Visit: Payer: Self-pay | Admitting: *Deleted

## 2021-03-30 MED ORDER — DAPAGLIFLOZIN PROPANEDIOL 5 MG PO TABS
5.0000 mg | ORAL_TABLET | Freq: Every day | ORAL | 4 refills | Status: DC
Start: 2021-03-30 — End: 2021-07-31

## 2021-03-30 NOTE — Telephone Encounter (Signed)
Call placed to patient and patient made aware.   Prescription assistance forms completed for patient.

## 2021-03-31 ENCOUNTER — Other Ambulatory Visit: Payer: Self-pay

## 2021-03-31 ENCOUNTER — Telehealth (HOSPITAL_COMMUNITY): Payer: Medicare Other | Admitting: Psychiatry

## 2021-04-02 ENCOUNTER — Encounter (HOSPITAL_COMMUNITY): Payer: Self-pay | Admitting: Psychiatry

## 2021-04-02 ENCOUNTER — Telehealth (INDEPENDENT_AMBULATORY_CARE_PROVIDER_SITE_OTHER): Payer: Medicare Other | Admitting: Psychiatry

## 2021-04-02 ENCOUNTER — Other Ambulatory Visit: Payer: Self-pay

## 2021-04-02 DIAGNOSIS — I6523 Occlusion and stenosis of bilateral carotid arteries: Secondary | ICD-10-CM

## 2021-04-02 DIAGNOSIS — F411 Generalized anxiety disorder: Secondary | ICD-10-CM

## 2021-04-02 MED ORDER — CLONAZEPAM 0.5 MG PO TABS: ORAL_TABLET | ORAL | 3 refills | Status: DC

## 2021-04-02 MED ORDER — FLUOXETINE HCL 20 MG PO CAPS: ORAL_CAPSULE | ORAL | 2 refills | Status: DC

## 2021-04-02 NOTE — Progress Notes (Signed)
Virtual Visit via Telephone Note  I connected with Barry [name] on 04/02/21 at  3:00 PM EDT by telephone and verified that I am speaking with the correct person using two identifiers.  Location: Patient: home Provider: home office   I discussed the limitations, risks, security and privacy concerns of performing an evaluation and management service by telephone and the availability of in person appointments. I also discussed with the patient that there may be a patient responsible charge related to this service. The patient expressed understanding and agreed to proceed.     I discussed the assessment and treatment plan with the patient. The patient was provided an opportunity to ask questions and all were answered. The patient agreed with the plan and demonstrated an understanding of the instructions.   The patient was advised to call back or seek an in-person evaluation if the symptoms worsen or if the condition fails to improve as anticipated.  I provided 15 minutes of non-face-to-face time during this encounter.   [name], MD  [facility] MD/PA/NP OP Progress Note  04/02/2021 3:21 PM Barry [name]  MRN:  [id number]  Chief Complaint:  Chief Complaint   Anxiety; Follow-up    HPI: This patient is an 87 year old widowed white male lives alone in [place].  He has 2 children and 3 grandchildren.  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  The patient returns for follow-up after 4 months.  He is still taking clonazepam 0.25 mg 4 times a day.  He states his this is going well for him and he denies significant anxiety.  He is sleeping well.  He denies depression.  He had a recent bout of dizziness but this was related to allergies.  He is doing better now.  He denies any other significant health issues. Visit Diagnosis:    ICD-10-CM   1. Generalized anxiety disorder  F41.1       Past Psychiatric History: Long-term outpatient treatment for  depression and anxiety  Past Medical History:  Past Medical History:  Diagnosis Date   AAA (abdominal aortic aneurysm) ([place]) 3-09   4.7 cm   Allergy    rhinitis   Anxiety    Cancer ([place])    skin   Cataract    Depression    Diabetes mellitus    Diverticulosis    GERD (gastroesophageal reflux disease)    Hepatitis    Hx of colonic polyps 08/26/2015   Hyperlipidemia    Hypertension    Nephrolithiasis    Personal history of colonic polyps    adenomas, serrated also   PVD (peripheral vascular disease) ([place])    Tobacco abuse     Past Surgical History:  Procedure Laterality Date   COLONOSCOPY  2006, 2008, 2010, 05/27/2011   numerous adenomas - 13 in 2006, 3, 2008, 2 2012 (up to 1 cm), 4 diminutive adenomas, serrated adenomas 2012. Diverticulosis and hemorrhoids also.   EYE SURGERY     EYE SURGERY Left    Kidney stone operation     lower limb amputation, other toe 5th     percutaneous stent graft repair of infrarenal AAA  11/10   5.6 cm (T. early)   surgical excision basal cell carcinoma     tympanic eardrum repair     VASECTOMY      Family Psychiatric History: see below  Family History:  Family History  Problem Relation Age of Onset   Emphysema Father  Cancer Brother        Lung   Depression Brother    Depression Sister    Lung disease Sister        Fibrosis and died from pneumonia on 04-12-13   Kidney disease Sister    Diabetes Sister    Learning disabilities Sister    Mental retardation Sister    Heart disease Mother    Cancer Brother    Colon cancer Neg Hx    Dementia Neg Hx    Alcohol abuse Neg Hx    Drug abuse Neg Hx    Paranoid behavior Neg Hx    Schizophrenia Neg Hx    Anxiety disorder Neg Hx    Bipolar disorder Neg Hx    OCD Neg Hx    Sexual abuse Neg Hx    Physical abuse Neg Hx    Seizures Neg Hx    Esophageal cancer Neg Hx    Stomach cancer Neg Hx    Rectal cancer Neg Hx     Social History:  Social History   Socioeconomic History    Marital status: Widowed    Spouse name: Not on file   Number of children: 2   Years of education: Not on file   Highest education level: Not on file  Occupational History    Employer: RETIRED  Tobacco Use   Smoking status: Every Day    Packs/day: 1.00    Years: 60.00    Pack years: 60.00    Types: Cigarettes   Smokeless tobacco: Never   Tobacco comments:    smokes about 16-20 cigs a day as of 04/04/2013  Vaping Use   Vaping Use: Never used  Substance and Sexual Activity   Alcohol use: No   Drug use: No   Sexual activity: Yes    Partners: Female    Birth control/protection: Surgical    Comment: vasectomy  Other Topics Concern   Not on file  Social History Narrative   HSG, 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 retired 76 years - keeping busy. Lives alone. 1- step-daughter, 1 son - '68; 3 grandchildren. 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    Difficulty of Paying Living Expenses: Not very hard  Food Insecurity: Not on file  Transportation Needs: Not on file  Physical Activity: Not on file  Stress: Not on file  Social Connections: Not on file    Allergies:  Allergies  Allergen Reactions   Lisinopril-Hydrochlorothiazide Swelling    ZESTRIL   Doxycycline Rash    Metabolic Disorder Labs: Lab Results  Component Value Date   HGBA1C 6.9 (H) 03/19/2021   MPG 151 03/19/2021   MPG 146 12/12/2020   No results found for: PROLACTIN Lab Results  Component Value Date   CHOL 128 03/19/2021   TRIG 99 03/19/2021   HDL 52 03/19/2021   CHOLHDL 2.5 03/19/2021   VLDL 17 05/26/2017   LDLCALC 58 03/19/2021   LDLCALC 61 12/12/2020   Lab Results  Component Value Date   TSH 1.266 12/21/2018   TSH 2.071 10/27/2018    Therapeutic Level Labs: No results found for: LITHIUM No results found for: VALPROATE No components found for:  CBMZ  Current Medications: Current Outpatient Medications  Medication Sig Dispense  Refill   aspirin EC 81 MG tablet Take 81 mg by mouth every morning.     cetirizine (ZYRTEC) 10 MG tablet Take 1 tablet (10 mg total) by mouth  daily. 90 tablet 2   clonazePAM (KLONOPIN) 0.5 MG tablet TAKE 1/2 TABLET BY MOUTHIN THE MORNING, AT NOON, IN THE EVENING, AND AT BEDTIME 60 tablet 3   dapagliflozin propanediol (FARXIGA) 5 MG TABS tablet Take 1 tablet (5 mg total) by mouth daily before breakfast. 90 tablet 4   ezetimibe (ZETIA) 10 MG tablet TAKE 1 TABLET BY MOUTH DAILY, NEED APPOINTMENT FOR FUTURE REFILL GENERIC EQUIVALENT FOR ZETIA 90 tablet 1   FLUoxetine (PROZAC) 20 MG capsule TAKE (1) CAPSULE BY MOUTH EACH MORNING. 90 capsule 2   glimepiride (AMARYL) 1 MG tablet Take 0.5 tablets (0.5 mg total) by mouth 2 (two) times daily. Take 1/2 tablet once a day 90 tablet 1   hydrochlorothiazide (HYDRODIURIL) 25 MG tablet TAKE 1 TABLET BY MOUTH DAILY 90 tablet 1   ibuprofen (ADVIL,MOTRIN) 200 MG tablet Take 400 mg by mouth every 4 (four) hours as needed for mild pain.     ipratropium (ATROVENT) 0.03 % nasal spray Place 2 sprays into both nostrils every 12 (twelve) hours. 30 mL 12   metoprolol succinate (TOPROL-XL) 25 MG 24 hr tablet TAKE ONE-HALF TABLET BY MOUTH DAILY. GENERIC EQUIVALENT FOR TOPROL XL. 45 tablet 0   montelukast (SINGULAIR) 10 MG tablet Take 1 tablet (10 mg total) by mouth at bedtime. 90 tablet 1   NIFEdipine (ADALAT CC) 60 MG 24 hr tablet Take 15mg  once a day 90 tablet 3   polyethylene glycol (MIRALAX / GLYCOLAX) packet Take 17 g by mouth daily as needed for mild constipation.      ramipril (ALTACE) 10 MG capsule TAKE ONE CAPSULE BY MOUTH EVERY DAY. GENERIC EQUIVALENT FOR ALTACE 90 capsule 3   sildenafil (VIAGRA) 100 MG tablet TAKE 1/2 TO 1 TABLET BY MOUTH DAILY AS NEEDED FOR ERECTILE DYSFUNCTION 6 tablet 0   simvastatin (ZOCOR) 40 MG tablet TAKE (1) TABLET BY MOUTH AT BEDTIME. 90 tablet 0   vitamin B-12 (CYANOCOBALAMIN) 1000 MCG tablet Take 1,000 mcg by mouth daily.     No current  facility-administered medications for this visit.     Musculoskeletal: Strength & Muscle Tone: within normal limits Gait & Station: normal Patient leans: N/A  Psychiatric Specialty Exam: Review of Systems  All other systems reviewed and are negative.  There were no vitals taken for this visit.There is no height or weight on file to calculate BMI.  General Appearance: NA  Eye Contact:  NA  Speech:  Clear and Coherent  Volume:  Normal  Mood:  Euthymic  Affect:  NA  Thought Process:  Goal Directed  Orientation:  Full (Time, Place, and Person)  Thought Content: WDL   Suicidal Thoughts:  No  Homicidal Thoughts:  No  Memory:  Immediate;   Good Recent;   Good Remote;   Good  Judgement:  Good  Insight:  Fair  Psychomotor Activity:  Normal  Concentration:  Concentration: Good and Attention Span: Good  Recall:  Good  Fund of Knowledge: Good  Language: Good  Akathisia:  No  Handed:  Right  AIMS (if indicated): not done  Assets:  Communication Skills Desire for Improvement Physical Health Resilience Social Support  ADL's:  Intact  Cognition: WNL  Sleep:  Good   Screenings: PHQ2-9    [place] Office Visit from 02/13/2021 in [place] Office Visit from 11/10/2020 in [place] Office Visit from 02/22/2020 in [place] Office Visit from 04/30/2019 in [place] Office Visit from 12/05/2018 in [place]  [facility]  PHQ-2 Total Score 0 0 0 0 0  PHQ-9 Total Score 2 3 0 -- --        Assessment and Plan: This patient is an 87 year old male with a history of depression and anxiety.  He is doing well on his current regimen.  He will continue clonazepam 0.25 mg 4 times daily for anxiety.  He will continue Prozac 20 mg daily for depression.  He will return to see me in 4 months   [name], MD 04/02/2021, 3:21 PM

## 2021-04-08 ENCOUNTER — Ambulatory Visit: Payer: Medicare Other | Admitting: Cardiology

## 2021-04-09 ENCOUNTER — Ambulatory Visit: Payer: Medicare Other | Admitting: General Practice

## 2021-04-14 NOTE — Telephone Encounter (Signed)
Prescription assistance application has been filed.

## 2021-04-24 ENCOUNTER — Telehealth: Payer: Self-pay | Admitting: Pharmacist

## 2021-04-24 DIAGNOSIS — M7542 Impingement syndrome of left shoulder: Secondary | ICD-10-CM | POA: Diagnosis not present

## 2021-04-24 NOTE — Progress Notes (Addendum)
Chronic Care Management Pharmacy Assistant   Name: Barry [name]  MRN: [id number] DOB: 16-Apr-1933  Reason for Encounter: Disease State For HTN.    Conditions to be addressed/monitored: HTN, CAD, GERD, Type II DM, HLD,  Depression/Anxiety.  Recent office visits:  03/09/21 Dr. Dennard Schaumann For follow-up. No medication changes.   Recent consult visits:  04/02/21 (Video Visit) [place] Cloria Spring, MD. For generalized anxiety.   Hospital visits:  None since 03/02/21  Medications: Outpatient Encounter Medications as of 04/24/2021  Medication Sig   aspirin EC 81 MG tablet Take 81 mg by mouth every morning.   cetirizine (ZYRTEC) 10 MG tablet Take 1 tablet (10 mg total) by mouth daily.   clonazePAM (KLONOPIN) 0.5 MG tablet TAKE 1/2 TABLET BY MOUTHIN THE MORNING, AT NOON, IN THE EVENING, AND AT BEDTIME   dapagliflozin propanediol (FARXIGA) 5 MG TABS tablet Take 1 tablet (5 mg total) by mouth daily before breakfast.   ezetimibe (ZETIA) 10 MG tablet TAKE 1 TABLET BY MOUTH DAILY, NEED APPOINTMENT FOR FUTURE REFILL GENERIC EQUIVALENT FOR ZETIA   FLUoxetine (PROZAC) 20 MG capsule TAKE (1) CAPSULE BY MOUTH EACH MORNING.   glimepiride (AMARYL) 1 MG tablet Take 0.5 tablets (0.5 mg total) by mouth 2 (two) times daily. Take 1/2 tablet once a day   hydrochlorothiazide (HYDRODIURIL) 25 MG tablet TAKE 1 TABLET BY MOUTH DAILY   ibuprofen (ADVIL,MOTRIN) 200 MG tablet Take 400 mg by mouth every 4 (four) hours as needed for mild pain.   ipratropium (ATROVENT) 0.03 % nasal spray Place 2 sprays into both nostrils every 12 (twelve) hours.   metoprolol succinate (TOPROL-XL) 25 MG 24 hr tablet TAKE ONE-HALF TABLET BY MOUTH DAILY. GENERIC EQUIVALENT FOR TOPROL XL.   montelukast (SINGULAIR) 10 MG tablet Take 1 tablet (10 mg total) by mouth at bedtime.   NIFEdipine (ADALAT CC) 60 MG 24 hr tablet Take 15mg  once a day   polyethylene glycol (MIRALAX / GLYCOLAX) packet Take 17 g by mouth daily as needed for  mild constipation.    ramipril (ALTACE) 10 MG capsule TAKE ONE CAPSULE BY MOUTH EVERY DAY. GENERIC EQUIVALENT FOR ALTACE   sildenafil (VIAGRA) 100 MG tablet TAKE 1/2 TO 1 TABLET BY MOUTH DAILY AS NEEDED FOR ERECTILE DYSFUNCTION   simvastatin (ZOCOR) 40 MG tablet TAKE (1) TABLET BY MOUTH AT BEDTIME.   vitamin B-12 (CYANOCOBALAMIN) 1000 MCG tablet Take 1,000 mcg by mouth daily.   No facility-administered encounter medications on file as of 04/24/2021.   Reviewed chart prior to disease state call. Spoke with patient regarding BP  Recent Office Vitals: BP Readings from Last 3 Encounters:  03/09/21 (!) 144/70  02/13/21 (!) 180/96  12/15/20 134/70   Pulse Readings from Last 3 Encounters:  03/09/21 (!) 58  02/13/21 88  12/15/20 64    Wt Readings from Last 3 Encounters:  03/09/21 167 lb (75.8 kg)  02/13/21 170 lb (77.1 kg)  12/15/20 169 lb (76.7 kg)     Kidney Function Lab Results  Component Value Date/Time   CREATININE 1.16 (H) 03/19/2021 09:15 AM   CREATININE 1.06 12/12/2020 08:12 AM   GFR 76.82 07/10/2013 12:13 PM   GFRNONAA 57 (L) 03/19/2021 09:15 AM   GFRAA 66 03/19/2021 09:15 AM    BMP Latest Ref Rng & Units 03/19/2021 12/12/2020 09/05/2020  Glucose 65 - 99 mg/dL 125(H) 128(H) 144(H)  BUN 7 - 25 mg/dL 28(H) 22 23  Creatinine 0.70 - 1.11 mg/dL 1.16(H) 1.06 1.12(H)  BUN/Creat Ratio 6 -  22 (calc) 84(Z) NOT APPLICABLE 21  Sodium 660 - 146 mmol/L 141 141 139  Potassium 3.5 - 5.3 mmol/L 4.6 4.2 4.3  Chloride 98 - 110 mmol/L 107 102 103  CO2 20 - 32 mmol/L 30 31 29   Calcium 8.6 - 10.3 mg/dL 8.8 9.3 8.9    Current antihypertensive regimen:  Metoprolol 25 mg 1/2 tablet daily Hydrochlorothiazide 25 mg 1 tablet daily Ramipril 10 mg 1 capsule daily   How often are you checking your Blood Pressure? Patient stated he does not check his blood pressure a lot but he does have blood sugar machine.   Current home BP readings: N/A.  What recent interventions/DTPs have been made by any  provider to improve Blood Pressure control since last CPP Visit: None  Any recent hospitalizations or ED visits since last visit with CPP? Patient stated no.   What diet changes have been made to improve Blood Pressure Control?  Patient stated he tries to eat healthy.   What exercise is being done to improve your Blood Pressure Control?  Patient stated he lives alone and does almost everything for himself.   Adherence Review: Is the patient currently on ACE/ARB medication? Ramipril 10 mg  Does the patient have >5 day gap between last estimated fill dates? Per misc rpts, yes.   Star Rating Drugs: Simvastatin 40 mg 90 DS 02/06/21, Ramipril 10 mg 90 DS 02/04/21, Dapagliflozin 5 mg 30 DS 12/23/20.   Patient stated he hurt his shoulder a few weeks ago and went to a walk in clinic today and received a cortisone shot that increased his blood sugar levels. I informed him to drink more water and monitor his blood sugar and make sure the high blood sugar levels do not stay elevated for more then a week.  Follow-Up:Pharmacist Review  Charlann Lange, RMA Clinical Pharmacist Assistant [phone]  10 minutes spent in review, coordination, and documentation.  Reviewed by: Beverly Milch, PharmD Clinical Pharmacist [facility] [phone]

## 2021-05-11 ENCOUNTER — Other Ambulatory Visit: Payer: Self-pay | Admitting: Family Medicine

## 2021-06-02 NOTE — Progress Notes (Deleted)
HPI: FU CAD. Patient underwent cardiac catheterization in 2005; he was found to have nonobstructive disease other than a 90% stenosis in a small branch of the right coronary artery. He was noted to have an 80% iliac stenosis and an abdominal aortic aneurysm. Procedure complicated by peripheral emboli requiring amputation of a digit. Patient has had stent graft of abdominal aortic aneurysm. Echocardiogram June 2021 showed normal LV function, mild LVH, grade 1 diastolic dysfunction, mild aortic stenosis with mean gradient 17 mmHg.  Monitor July 2021 showed sinus rhythm with occasional PAC, PVC, brief PAT, 4 and 5 beat runs of nonsustained ventricular tachycardia and transient brief idioventricular rhythm at 6:33 AM.  MRA May 2022 showed no significant proximal stenosis, aneurysm or branch vessel occlusion.  There was moderate narrowing of the supraclinoid right internal carotid artery.  Since last seen,   Current Outpatient Medications  Medication Sig Dispense Refill   aspirin EC 81 MG tablet Take 81 mg by mouth every morning.     cetirizine (ZYRTEC) 10 MG tablet Take 1 tablet (10 mg total) by mouth daily. 90 tablet 2   clonazePAM (KLONOPIN) 0.5 MG tablet TAKE 1/2 TABLET BY MOUTHIN THE MORNING, AT NOON, IN THE EVENING, AND AT BEDTIME 60 tablet 3   dapagliflozin propanediol (FARXIGA) 5 MG TABS tablet Take 1 tablet (5 mg total) by mouth daily before breakfast. 90 tablet 4   ezetimibe (ZETIA) 10 MG tablet TAKE 1 TABLET BY MOUTH DAILY, NEED APPOINTMENT FOR FUTURE REFILL GENERIC EQUIVALENT FOR ZETIA 90 tablet 1   FLUoxetine (PROZAC) 20 MG capsule TAKE (1) CAPSULE BY MOUTH EACH MORNING. 90 capsule 2   glimepiride (AMARYL) 1 MG tablet Take 0.5 tablets (0.5 mg total) by mouth 2 (two) times daily. Take 1/2 tablet once a day 90 tablet 1   hydrochlorothiazide (HYDRODIURIL) 25 MG tablet TAKE 1 TABLET BY MOUTH DAILY 90 tablet 1   ibuprofen (ADVIL,MOTRIN) 200 MG tablet Take 400 mg by mouth every 4 (four) hours  as needed for mild pain.     ipratropium (ATROVENT) 0.03 % nasal spray Place 2 sprays into both nostrils every 12 (twelve) hours. 30 mL 12   metoprolol succinate (TOPROL-XL) 25 MG 24 hr tablet TAKE ONE-HALF TABLET BY MOUTH DAILY. GENERIC EQUIVALENT FOR TOPROL XL. 45 tablet 0   montelukast (SINGULAIR) 10 MG tablet Take 1 tablet (10 mg total) by mouth at bedtime. 90 tablet 1   NIFEdipine (ADALAT CC) 60 MG 24 hr tablet Take '15mg'$  once a day 90 tablet 3   polyethylene glycol (MIRALAX / GLYCOLAX) packet Take 17 g by mouth daily as needed for mild constipation.      ramipril (ALTACE) 10 MG capsule TAKE ONE CAPSULE BY MOUTH EVERY DAY. GENERIC EQUIVALENT FOR ALTACE 90 capsule 3   sildenafil (VIAGRA) 100 MG tablet TAKE 1/2 TO 1 TABLET BY MOUTH DAILY AS NEEDED FOR ERECTILE DYSFUNCTION 6 tablet 0   simvastatin (ZOCOR) 40 MG tablet TAKE (1) TABLET BY MOUTH AT BEDTIME. 90 tablet 0   vitamin B-12 (CYANOCOBALAMIN) 1000 MCG tablet Take 1,000 mcg by mouth daily.     No current facility-administered medications for this visit.     Past Medical History:  Diagnosis Date   AAA (abdominal aortic aneurysm) ([place]) 3-09   4.7 cm   Allergy    rhinitis   Anxiety    Cancer ([place])    skin   Cataract    Depression    Diabetes mellitus    Diverticulosis  GERD (gastroesophageal reflux disease)    Hepatitis    Hx of colonic polyps 08/26/2015   Hyperlipidemia    Hypertension    Nephrolithiasis    Personal history of colonic polyps    adenomas, serrated also   PVD (peripheral vascular disease) ([place])    Tobacco abuse     Past Surgical History:  Procedure Laterality Date   COLONOSCOPY  2006, 2008, 2010, 05/27/2011   numerous adenomas - 13 in 2006, 3, 2008, 2 2012 (up to 1 cm), 4 diminutive adenomas, serrated adenomas 2012. Diverticulosis and hemorrhoids also.   EYE SURGERY     EYE SURGERY Left    Kidney stone operation     lower limb amputation, other toe 5th     percutaneous stent graft repair of infrarenal  AAA  11/10   5.6 cm (T. early)   surgical excision basal cell carcinoma     tympanic eardrum repair     VASECTOMY      Social History   Socioeconomic History   Marital status: Widowed    Spouse name: Not on file   Number of children: 2   Years of education: Not on file   Highest education level: Not on file  Occupational History    Employer: RETIRED  Tobacco Use   Smoking status: Every Day    Packs/day: 1.00    Years: 60.00    Pack years: 60.00    Types: Cigarettes   Smokeless tobacco: Never   Tobacco comments:    smokes about 16-20 cigs a day as of 04/04/2013  Vaping Use   Vaping Use: Never used  Substance and Sexual Activity   Alcohol use: No   Drug use: No   Sexual activity: Yes    Partners: Female    Birth control/protection: Surgical    Comment: vasectomy  Other Topics Concern   Not on file  Social History Narrative   HSG, 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 retired 22 years - keeping busy. Lives alone. 1- step-daughter, 1 son - '68; 3 grandchildren. does odd-jobs, yard work   Oncologist: Low Risk    Difficulty of Paying Living Expenses: Not very hard  Food Insecurity: Not on file  Transportation Needs: Not on file  Physical Activity: Not on file  Stress: Not on file  Social Connections: Not on file  Intimate Partner Violence: Not on file    Family History  Problem Relation Age of Onset   Emphysema Father    Cancer Brother        Lung   Depression Brother    Depression Sister    Lung disease Sister        Fibrosis and died from pneumonia on March 26, 2013   Kidney disease Sister    Diabetes Sister    Learning disabilities Sister    Mental retardation Sister    Heart disease Mother    Cancer Brother    Colon cancer Neg Hx    Dementia Neg Hx    Alcohol abuse Neg Hx    Drug abuse Neg Hx    Paranoid behavior Neg Hx    Schizophrenia Neg Hx    Anxiety disorder Neg Hx    Bipolar disorder Neg Hx    OCD  Neg Hx    Sexual abuse Neg Hx    Physical abuse Neg Hx    Seizures Neg Hx    Esophageal cancer Neg Hx    Stomach cancer Neg Hx  Rectal cancer Neg Hx     ROS: no fevers or chills, productive cough, hemoptysis, dysphasia, odynophagia, melena, hematochezia, dysuria, hematuria, rash, seizure activity, orthopnea, PND, pedal edema, claudication. Remaining systems are negative.  Physical Exam: Well-developed well-nourished in no acute distress.  Skin is warm and dry.  HEENT is normal.  Neck is supple.  Chest is clear to auscultation with normal expansion.  Cardiovascular exam is regular rate and rhythm.  Abdominal exam nontender or distended. No masses palpated. Extremities show no edema. neuro grossly intact  ECG- personally reviewed  A/P  1 coronary disease-patient doing well with no cardiac symptoms.  Continue medical therapy with aspirin and statin.  2 hypertension-blood pressures controlled.  Continue present medical regimen.  3 history of abdominal aortic aneurysm repair-follow-up vascular surgery.  4 hyperlipidemia-continue Zocor and Zetia.  He did not tolerate Crestor or Lipitor previously.  5 history of aortic stenosis-we will arrange follow-up echocardiogram to reassess.  6 tobacco abuse-patient counseled on discontinuing.  Kirk Ruths, MD

## 2021-06-03 ENCOUNTER — Other Ambulatory Visit: Payer: Self-pay | Admitting: Family Medicine

## 2021-06-04 ENCOUNTER — Other Ambulatory Visit: Payer: Self-pay | Admitting: *Deleted

## 2021-06-04 MED ORDER — HYDROCHLOROTHIAZIDE 25 MG PO TABS: 25.0000 mg | ORAL_TABLET | Freq: Every day | ORAL | 1 refills | Status: DC

## 2021-06-04 MED ORDER — METOPROLOL SUCCINATE ER 25 MG PO TB24: ORAL_TABLET | ORAL | 0 refills | Status: DC

## 2021-06-04 NOTE — Addendum Note (Signed)
Addended by: Sheral Flow on: 06/04/2021 06:16 PM   Modules accepted: Orders

## 2021-06-11 ENCOUNTER — Ambulatory Visit: Payer: Medicare Other | Admitting: Cardiology

## 2021-06-29 ENCOUNTER — Other Ambulatory Visit: Payer: Self-pay

## 2021-06-29 DIAGNOSIS — I2510 Atherosclerotic heart disease of native coronary artery without angina pectoris: Secondary | ICD-10-CM

## 2021-06-29 MED ORDER — EZETIMIBE 10 MG PO TABS: ORAL_TABLET | ORAL | 2 refills | Status: DC

## 2021-07-06 ENCOUNTER — Telehealth: Payer: Self-pay

## 2021-07-27 ENCOUNTER — Emergency Department (HOSPITAL_COMMUNITY): Payer: Medicare Other

## 2021-07-27 ENCOUNTER — Inpatient Hospital Stay (HOSPITAL_COMMUNITY): Payer: Medicare Other

## 2021-07-27 ENCOUNTER — Other Ambulatory Visit: Payer: Self-pay

## 2021-07-27 ENCOUNTER — Inpatient Hospital Stay (HOSPITAL_COMMUNITY)
Admission: EM | Admit: 2021-07-27 | Discharge: 2021-07-31 | DRG: 065 | Disposition: A | Discharge status: Rehabilitation Facility | Payer: Medicare Other | Attending: Family Medicine | Admitting: Family Medicine

## 2021-07-27 DIAGNOSIS — R531 Weakness: Secondary | ICD-10-CM | POA: Diagnosis not present

## 2021-07-27 DIAGNOSIS — J309 Allergic rhinitis, unspecified: Secondary | ICD-10-CM | POA: Diagnosis present

## 2021-07-27 DIAGNOSIS — I69952 Hemiplegia and hemiparesis following unspecified cerebrovascular disease affecting left dominant side: Secondary | ICD-10-CM | POA: Diagnosis not present

## 2021-07-27 DIAGNOSIS — I6340 Cerebral infarction due to embolism of unspecified cerebral artery: Secondary | ICD-10-CM | POA: Diagnosis not present

## 2021-07-27 DIAGNOSIS — E1165 Type 2 diabetes mellitus with hyperglycemia: Secondary | ICD-10-CM | POA: Diagnosis not present

## 2021-07-27 DIAGNOSIS — J302 Other seasonal allergic rhinitis: Secondary | ICD-10-CM | POA: Diagnosis not present

## 2021-07-27 DIAGNOSIS — N1831 Chronic kidney disease, stage 3a: Secondary | ICD-10-CM | POA: Diagnosis present

## 2021-07-27 DIAGNOSIS — E46 Unspecified protein-calorie malnutrition: Secondary | ICD-10-CM | POA: Diagnosis present

## 2021-07-27 DIAGNOSIS — E1151 Type 2 diabetes mellitus with diabetic peripheral angiopathy without gangrene: Secondary | ICD-10-CM | POA: Diagnosis not present

## 2021-07-27 DIAGNOSIS — Z825 Family history of asthma and other chronic lower respiratory diseases: Secondary | ICD-10-CM

## 2021-07-27 DIAGNOSIS — Z9852 Vasectomy status: Secondary | ICD-10-CM

## 2021-07-27 DIAGNOSIS — Z8249 Family history of ischemic heart disease and other diseases of the circulatory system: Secondary | ICD-10-CM

## 2021-07-27 DIAGNOSIS — M21611 Bunion of right foot: Secondary | ICD-10-CM | POA: Diagnosis not present

## 2021-07-27 DIAGNOSIS — Z87442 Personal history of urinary calculi: Secondary | ICD-10-CM

## 2021-07-27 DIAGNOSIS — W19XXXA Unspecified fall, initial encounter: Secondary | ICD-10-CM | POA: Diagnosis not present

## 2021-07-27 DIAGNOSIS — K5900 Constipation, unspecified: Secondary | ICD-10-CM | POA: Diagnosis not present

## 2021-07-27 DIAGNOSIS — K5790 Diverticulosis of intestine, part unspecified, without perforation or abscess without bleeding: Secondary | ICD-10-CM | POA: Diagnosis present

## 2021-07-27 DIAGNOSIS — K219 Gastro-esophageal reflux disease without esophagitis: Secondary | ICD-10-CM | POA: Diagnosis present

## 2021-07-27 DIAGNOSIS — I714 Abdominal aortic aneurysm, without rupture, unspecified: Secondary | ICD-10-CM | POA: Diagnosis present

## 2021-07-27 DIAGNOSIS — R29701 NIHSS score 1: Secondary | ICD-10-CM | POA: Diagnosis present

## 2021-07-27 DIAGNOSIS — Z6823 Body mass index (BMI) 23.0-23.9, adult: Secondary | ICD-10-CM | POA: Diagnosis not present

## 2021-07-27 DIAGNOSIS — F32A Depression, unspecified: Secondary | ICD-10-CM | POA: Diagnosis present

## 2021-07-27 DIAGNOSIS — I361 Nonrheumatic tricuspid (valve) insufficiency: Secondary | ICD-10-CM | POA: Diagnosis not present

## 2021-07-27 DIAGNOSIS — F419 Anxiety disorder, unspecified: Secondary | ICD-10-CM | POA: Diagnosis not present

## 2021-07-27 DIAGNOSIS — E1122 Type 2 diabetes mellitus with diabetic chronic kidney disease: Secondary | ICD-10-CM | POA: Diagnosis not present

## 2021-07-27 DIAGNOSIS — E876 Hypokalemia: Secondary | ICD-10-CM | POA: Diagnosis not present

## 2021-07-27 DIAGNOSIS — F17210 Nicotine dependence, cigarettes, uncomplicated: Secondary | ICD-10-CM | POA: Diagnosis not present

## 2021-07-27 DIAGNOSIS — I69354 Hemiplegia and hemiparesis following cerebral infarction affecting left non-dominant side: Secondary | ICD-10-CM | POA: Diagnosis not present

## 2021-07-27 DIAGNOSIS — I69322 Dysarthria following cerebral infarction: Secondary | ICD-10-CM | POA: Diagnosis not present

## 2021-07-27 DIAGNOSIS — I10 Essential (primary) hypertension: Secondary | ICD-10-CM

## 2021-07-27 DIAGNOSIS — Z85828 Personal history of other malignant neoplasm of skin: Secondary | ICD-10-CM

## 2021-07-27 DIAGNOSIS — I6622 Occlusion and stenosis of left posterior cerebral artery: Secondary | ICD-10-CM | POA: Diagnosis not present

## 2021-07-27 DIAGNOSIS — G8194 Hemiplegia, unspecified affecting left nondominant side: Secondary | ICD-10-CM | POA: Diagnosis present

## 2021-07-27 DIAGNOSIS — I6381 Other cerebral infarction due to occlusion or stenosis of small artery: Secondary | ICD-10-CM | POA: Diagnosis not present

## 2021-07-27 DIAGNOSIS — M19041 Primary osteoarthritis, right hand: Secondary | ICD-10-CM | POA: Diagnosis not present

## 2021-07-27 DIAGNOSIS — H9190 Unspecified hearing loss, unspecified ear: Secondary | ICD-10-CM | POA: Diagnosis present

## 2021-07-27 DIAGNOSIS — Z20822 Contact with and (suspected) exposure to covid-19: Secondary | ICD-10-CM | POA: Diagnosis not present

## 2021-07-27 DIAGNOSIS — N183 Chronic kidney disease, stage 3 unspecified: Secondary | ICD-10-CM | POA: Diagnosis not present

## 2021-07-27 DIAGNOSIS — I639 Cerebral infarction, unspecified: Secondary | ICD-10-CM | POA: Diagnosis not present

## 2021-07-27 DIAGNOSIS — Z23 Encounter for immunization: Secondary | ICD-10-CM | POA: Diagnosis not present

## 2021-07-27 DIAGNOSIS — Z833 Family history of diabetes mellitus: Secondary | ICD-10-CM

## 2021-07-27 DIAGNOSIS — I6389 Other cerebral infarction: Secondary | ICD-10-CM | POA: Diagnosis not present

## 2021-07-27 DIAGNOSIS — R001 Bradycardia, unspecified: Secondary | ICD-10-CM | POA: Diagnosis not present

## 2021-07-27 DIAGNOSIS — Z818 Family history of other mental and behavioral disorders: Secondary | ICD-10-CM

## 2021-07-27 DIAGNOSIS — R270 Ataxia, unspecified: Secondary | ICD-10-CM | POA: Diagnosis present

## 2021-07-27 DIAGNOSIS — Z7982 Long term (current) use of aspirin: Secondary | ICD-10-CM | POA: Diagnosis not present

## 2021-07-27 DIAGNOSIS — Z881 Allergy status to other antibiotic agents status: Secondary | ICD-10-CM

## 2021-07-27 DIAGNOSIS — I6523 Occlusion and stenosis of bilateral carotid arteries: Secondary | ICD-10-CM | POA: Diagnosis not present

## 2021-07-27 DIAGNOSIS — I350 Nonrheumatic aortic (valve) stenosis: Secondary | ICD-10-CM | POA: Diagnosis not present

## 2021-07-27 DIAGNOSIS — J449 Chronic obstructive pulmonary disease, unspecified: Secondary | ICD-10-CM | POA: Diagnosis not present

## 2021-07-27 DIAGNOSIS — E785 Hyperlipidemia, unspecified: Secondary | ICD-10-CM | POA: Diagnosis present

## 2021-07-27 DIAGNOSIS — R296 Repeated falls: Secondary | ICD-10-CM | POA: Diagnosis not present

## 2021-07-27 DIAGNOSIS — Z841 Family history of disorders of kidney and ureter: Secondary | ICD-10-CM

## 2021-07-27 DIAGNOSIS — Z79899 Other long term (current) drug therapy: Secondary | ICD-10-CM

## 2021-07-27 DIAGNOSIS — I2510 Atherosclerotic heart disease of native coronary artery without angina pectoris: Secondary | ICD-10-CM | POA: Diagnosis not present

## 2021-07-27 DIAGNOSIS — I129 Hypertensive chronic kidney disease with stage 1 through stage 4 chronic kidney disease, or unspecified chronic kidney disease: Secondary | ICD-10-CM | POA: Diagnosis not present

## 2021-07-27 DIAGNOSIS — G2581 Restless legs syndrome: Secondary | ICD-10-CM | POA: Diagnosis not present

## 2021-07-27 DIAGNOSIS — Z888 Allergy status to other drugs, medicaments and biological substances status: Secondary | ICD-10-CM

## 2021-07-27 DIAGNOSIS — G479 Sleep disorder, unspecified: Secondary | ICD-10-CM | POA: Diagnosis not present

## 2021-07-27 LAB — I-STAT CHEM 8, ED
BUN: 22 mg/dL (ref 8–23)
Calcium, Ion: 1.18 mmol/L (ref 1.15–1.40)
Chloride: 100 mmol/L (ref 98–111)
Creatinine, Ser: 1.3 mg/dL — ABNORMAL HIGH (ref 0.61–1.24)
Glucose, Bld: 173 mg/dL — ABNORMAL HIGH (ref 70–99)
HCT: 47 % (ref 39.0–52.0)
Hemoglobin: 16 g/dL (ref 13.0–17.0)
Potassium: 3.7 mmol/L (ref 3.5–5.1)
Sodium: 138 mmol/L (ref 135–145)
TCO2: 27 mmol/L (ref 22–32)

## 2021-07-27 LAB — CBC
HCT: 46.4 % (ref 39.0–52.0)
Hemoglobin: 15.8 g/dL (ref 13.0–17.0)
MCH: 32.9 pg (ref 26.0–34.0)
MCHC: 34.1 g/dL (ref 30.0–36.0)
MCV: 96.7 fL (ref 80.0–100.0)
Platelets: 210 10*3/uL (ref 150–400)
RBC: 4.8 MIL/uL (ref 4.22–5.81)
RDW: 13.4 % (ref 11.5–15.5)
WBC: 9.2 10*3/uL (ref 4.0–10.5)
nRBC: 0 % (ref 0.0–0.2)

## 2021-07-27 LAB — COMPREHENSIVE METABOLIC PANEL
ALT: 17 U/L (ref 0–44)
AST: 25 U/L (ref 15–41)
Albumin: 4.4 g/dL (ref 3.5–5.0)
Alkaline Phosphatase: 49 U/L (ref 38–126)
Anion gap: 9 (ref 5–15)
BUN: 24 mg/dL — ABNORMAL HIGH (ref 8–23)
CO2: 27 mmol/L (ref 22–32)
Calcium: 9.4 mg/dL (ref 8.9–10.3)
Chloride: 100 mmol/L (ref 98–111)
Creatinine, Ser: 1.36 mg/dL — ABNORMAL HIGH (ref 0.61–1.24)
GFR, Estimated: 51 mL/min — ABNORMAL LOW (ref >60)
Glucose, Bld: 174 mg/dL — ABNORMAL HIGH (ref 70–99)
Potassium: 3.7 mmol/L (ref 3.5–5.1)
Sodium: 136 mmol/L (ref 135–145)
Total Bilirubin: 0.5 mg/dL (ref 0.3–1.2)
Total Protein: 7.9 g/dL (ref 6.5–8.1)

## 2021-07-27 LAB — GLUCOSE, CAPILLARY
Glucose-Capillary: 142 mg/dL — ABNORMAL HIGH (ref 70–99)
Glucose-Capillary: 150 mg/dL — ABNORMAL HIGH (ref 70–99)
Glucose-Capillary: 194 mg/dL — ABNORMAL HIGH (ref 70–99)

## 2021-07-27 LAB — LIPID PANEL
Cholesterol: 164 mg/dL (ref 0–200)
HDL: 64 mg/dL (ref >40)
LDL Cholesterol: 84 mg/dL (ref 0–99)
Total CHOL/HDL Ratio: 2.6 RATIO
Triglycerides: 81 mg/dL (ref <150)
VLDL: 16 mg/dL (ref 0–40)

## 2021-07-27 LAB — RAPID URINE DRUG SCREEN, HOSP PERFORMED
Amphetamines: NOT DETECTED
Barbiturates: NOT DETECTED
Benzodiazepines: NOT DETECTED
Cocaine: NOT DETECTED
Opiates: NOT DETECTED
Tetrahydrocannabinol: NOT DETECTED

## 2021-07-27 LAB — DIFFERENTIAL
Abs Immature Granulocytes: 0.06 10*3/uL (ref 0.00–0.07)
Basophils Absolute: 0.1 10*3/uL (ref 0.0–0.1)
Basophils Relative: 1 %
Eosinophils Absolute: 0 10*3/uL (ref 0.0–0.5)
Eosinophils Relative: 0 %
Immature Granulocytes: 1 %
Lymphocytes Relative: 14 %
Lymphs Abs: 1.3 10*3/uL (ref 0.7–4.0)
Monocytes Absolute: 0.9 10*3/uL (ref 0.1–1.0)
Monocytes Relative: 10 %
Neutro Abs: 6.9 10*3/uL (ref 1.7–7.7)
Neutrophils Relative %: 74 %

## 2021-07-27 LAB — URINALYSIS, ROUTINE W REFLEX MICROSCOPIC
Bacteria, UA: NONE SEEN
Bilirubin Urine: NEGATIVE
Glucose, UA: NEGATIVE mg/dL
Ketones, ur: NEGATIVE mg/dL
Leukocytes,Ua: NEGATIVE
Nitrite: NEGATIVE
Protein, ur: 100 mg/dL — AB
Specific Gravity, Urine: 1.012 (ref 1.005–1.030)
pH: 7 (ref 5.0–8.0)

## 2021-07-27 LAB — RESP PANEL BY RT-PCR (FLU A&B, COVID) ARPGX2
Influenza A by PCR: NEGATIVE
Influenza B by PCR: NEGATIVE
SARS Coronavirus 2 by RT PCR: NEGATIVE

## 2021-07-27 LAB — PROTIME-INR
INR: 1 (ref 0.8–1.2)
Prothrombin Time: 13 seconds (ref 11.4–15.2)

## 2021-07-27 LAB — APTT: aPTT: 29 seconds (ref 24–36)

## 2021-07-27 LAB — ETHANOL: Alcohol, Ethyl (B): <10 mg/dL (ref <10)

## 2021-07-27 LAB — CBG MONITORING, ED: Glucose-Capillary: 160 mg/dL — ABNORMAL HIGH (ref 70–99)

## 2021-07-27 LAB — HEMOGLOBIN A1C
Hgb A1c MFr Bld: 6.7 % — ABNORMAL HIGH (ref 4.8–5.6)
Mean Plasma Glucose: 145.59 mg/dL

## 2021-07-27 MED ORDER — MONTELUKAST SODIUM 10 MG PO TABS
10.0000 mg | ORAL_TABLET | Freq: Every day | ORAL | Status: DC
  Administered 2021-07-27 – 2021-07-30 (×4): 10 mg via ORAL
  Filled 2021-07-27 (×4): qty 1

## 2021-07-27 MED ORDER — HEPARIN SODIUM (PORCINE) 5000 UNIT/ML IJ SOLN
5000.0000 [IU] | Freq: Three times a day (TID) | INTRAMUSCULAR | Status: DC
  Administered 2021-07-27 – 2021-07-31 (×11): 5000 [IU] via SUBCUTANEOUS
  Filled 2021-07-27 (×11): qty 1

## 2021-07-27 MED ORDER — CLONAZEPAM 0.25 MG PO TBDP: 0.2500 mg | ORAL_TABLET | Freq: Three times a day (TID) | ORAL | Status: DC | PRN

## 2021-07-27 MED ORDER — INSULIN ASPART 100 UNIT/ML IJ SOLN
0.0000 [IU] | Freq: Three times a day (TID) | INTRAMUSCULAR | Status: DC
  Administered 2021-07-27: 1 [IU] via SUBCUTANEOUS
  Administered 2021-07-28: 2 [IU] via SUBCUTANEOUS
  Administered 2021-07-28: 1 [IU] via SUBCUTANEOUS
  Administered 2021-07-29: 2 [IU] via SUBCUTANEOUS
  Administered 2021-07-30: 3 [IU] via SUBCUTANEOUS
  Administered 2021-07-30 – 2021-07-31 (×3): 2 [IU] via SUBCUTANEOUS
  Administered 2021-07-31: 3 [IU] via SUBCUTANEOUS

## 2021-07-27 MED ORDER — ASPIRIN EC 81 MG PO TBEC
81.0000 mg | DELAYED_RELEASE_TABLET | Freq: Every morning | ORAL | Status: DC
  Administered 2021-07-27 – 2021-07-31 (×5): 81 mg via ORAL
  Filled 2021-07-27 (×5): qty 1

## 2021-07-27 MED ORDER — INSULIN ASPART 100 UNIT/ML IJ SOLN: 0.0000 [IU] | Freq: Every day | INTRAMUSCULAR | Status: DC

## 2021-07-27 MED ORDER — PNEUMOCOCCAL VAC POLYVALENT 25 MCG/0.5ML IJ INJ
0.5000 mL | INJECTION | INTRAMUSCULAR | Status: DC
  Filled 2021-07-27 (×2): qty 0.5

## 2021-07-27 MED ORDER — CLOPIDOGREL BISULFATE 75 MG PO TABS
75.0000 mg | ORAL_TABLET | Freq: Every day | ORAL | Status: DC
  Administered 2021-07-28 – 2021-07-31 (×4): 75 mg via ORAL
  Filled 2021-07-27 (×5): qty 1

## 2021-07-27 MED ORDER — VITAMIN B-12 1000 MCG PO TABS
1000.0000 ug | ORAL_TABLET | Freq: Every day | ORAL | Status: DC
  Administered 2021-07-27 – 2021-07-31 (×5): 1000 ug via ORAL
  Filled 2021-07-27 (×5): qty 1

## 2021-07-27 MED ORDER — HYDROCODONE-ACETAMINOPHEN 5-325 MG PO TABS
1.0000 | ORAL_TABLET | Freq: Four times a day (QID) | ORAL | Status: DC | PRN
  Administered 2021-07-30: 1 via ORAL
  Filled 2021-07-27: qty 1

## 2021-07-27 MED ORDER — EZETIMIBE 10 MG PO TABS
10.0000 mg | ORAL_TABLET | Freq: Every day | ORAL | Status: DC
  Administered 2021-07-27 – 2021-07-31 (×5): 10 mg via ORAL
  Filled 2021-07-27 (×5): qty 1

## 2021-07-27 MED ORDER — CLOPIDOGREL BISULFATE 75 MG PO TABS
300.0000 mg | ORAL_TABLET | Freq: Once | ORAL | Status: AC
  Administered 2021-07-27: 300 mg via ORAL
  Filled 2021-07-27: qty 4

## 2021-07-27 MED ORDER — CLONAZEPAM 0.25 MG PO TBDP
0.2500 mg | ORAL_TABLET | Freq: Three times a day (TID) | ORAL | Status: DC
  Administered 2021-07-27 – 2021-07-31 (×15): 0.25 mg via ORAL
  Filled 2021-07-27 (×15): qty 1

## 2021-07-27 MED ORDER — INFLUENZA VAC A&B SA ADJ QUAD 0.5 ML IM PRSY
0.5000 mL | PREFILLED_SYRINGE | INTRAMUSCULAR | Status: DC
  Filled 2021-07-27 (×2): qty 0.5

## 2021-07-27 MED ORDER — ACETAMINOPHEN 325 MG PO TABS
650.0000 mg | ORAL_TABLET | Freq: Four times a day (QID) | ORAL | Status: DC | PRN
  Administered 2021-07-27 – 2021-07-31 (×4): 650 mg via ORAL
  Filled 2021-07-27 (×4): qty 2

## 2021-07-27 MED ORDER — IPRATROPIUM-ALBUTEROL 0.5-2.5 (3) MG/3ML IN SOLN
3.0000 mL | Freq: Four times a day (QID) | RESPIRATORY_TRACT | Status: DC | PRN
Start: 2021-07-27 — End: 2021-07-31

## 2021-07-27 MED ORDER — ACETAMINOPHEN 650 MG RE SUPP: 650.0000 mg | Freq: Four times a day (QID) | RECTAL | Status: DC | PRN

## 2021-07-27 MED ORDER — ONDANSETRON HCL 4 MG PO TABS: 4.0000 mg | ORAL_TABLET | Freq: Four times a day (QID) | ORAL | Status: DC | PRN

## 2021-07-27 MED ORDER — IOHEXOL 350 MG/ML SOLN
75.0000 mL | Freq: Once | INTRAVENOUS | Status: AC | PRN
  Administered 2021-07-27: 75 mL via INTRAVENOUS

## 2021-07-27 MED ORDER — LORATADINE 10 MG PO TABS
10.0000 mg | ORAL_TABLET | Freq: Every day | ORAL | Status: DC
  Administered 2021-07-27 – 2021-07-31 (×5): 10 mg via ORAL
  Filled 2021-07-27 (×5): qty 1

## 2021-07-27 MED ORDER — FLUOXETINE HCL 20 MG PO CAPS
20.0000 mg | ORAL_CAPSULE | Freq: Every day | ORAL | Status: DC
  Administered 2021-07-27 – 2021-07-31 (×5): 20 mg via ORAL
  Filled 2021-07-27 (×5): qty 1

## 2021-07-27 MED ORDER — SIMVASTATIN 20 MG PO TABS
40.0000 mg | ORAL_TABLET | Freq: Every day | ORAL | Status: DC
  Administered 2021-07-27 – 2021-07-30 (×4): 40 mg via ORAL
  Filled 2021-07-27 (×4): qty 2

## 2021-07-27 MED ORDER — SODIUM CHLORIDE 0.9 % IV SOLN: INTRAVENOUS | Status: AC

## 2021-07-27 MED ORDER — PANTOPRAZOLE SODIUM 40 MG PO TBEC
40.0000 mg | DELAYED_RELEASE_TABLET | Freq: Every day | ORAL | Status: DC
  Administered 2021-07-27 – 2021-07-31 (×5): 40 mg via ORAL
  Filled 2021-07-27 (×5): qty 1

## 2021-07-27 MED ORDER — ONDANSETRON HCL 4 MG/2ML IJ SOLN: 4.0000 mg | Freq: Four times a day (QID) | INTRAMUSCULAR | Status: DC | PRN

## 2021-07-27 MED ORDER — LABETALOL HCL 5 MG/ML IV SOLN
10.0000 mg | INTRAVENOUS | Status: DC | PRN
  Administered 2021-07-27 – 2021-07-30 (×8): 10 mg via INTRAVENOUS
  Filled 2021-07-27 (×6): qty 4

## 2021-07-27 NOTE — Consult Note (Signed)
Hazard A. Merlene Laughter, MD     [email or web address]          Barry [name] is an 87 y.o. male.   ASSESSMENT/PLAN:  The patient presents with left-sided hemiparesis. This is likely due to small infarcts seen on imaging and involving the posterior limb of internal capsule on the corresponding right side. However, imaging shows multiple bilateral infarcts suggestive of cardioembolic phenomena. Dual antiplatelet agents are recommended. Follow-up echocardiography. Likely will need 30 day event monitor. Continue with the the use of statin, blood pressure and blood sugar control.    This is a 87 year old right-handed white male who presents with the acute onset of leg weakness. Initially thought that both legs were weak and he fell after drinking his morning coffee. On standing he notice that his left arm was also weak and that the weakness involve more the left leg. The patient denies dysarthria, dysphagia, , dizziness, headaches, syncope, chest pain or palpitation. He is on aspirin and has been compliant with this.  He does report significant hearing impairment.  Reports being otherwise active at home.The review systems otherwise negative.    GENERAL:  This is a pleasant male who is doing well at this time.  HEENT:  Neck is supple no trauma noted.  ABDOMEN: soft  EXTREMITIES: No edema   BACK: Normal  SKIN: Normal by inspection.    MENTAL STATUS: Alert and oriented -  including orientation to the month and in his age. Speech, language and cognition are generally intact. Judgment and insight normal.   CRANIAL NERVES: Pupils are equal, round and reactive to light and accomodation; extra ocular movements are full, there is no significant nystagmus; visual fields are full; upper and lower facial muscles are normal in strength and symmetric, there is no flattening of the nasolabial folds; tongue is midline; uvula is midline; shoulder elevation is normal.  MOTOR:  The left  upper extremity has a significant drift that his to bed. Deltoid and proximal strength is graded as 3/5. Distal strength including hand grip 4/5. Left lower extremity proximal distal strength are graded as 4/5 with mild drift. The right side shows normal tone, bulk and strength.  COORDINATION: Left finger to nose is normal, right finger to nose is normal, No rest tremor; no intention tremor; no postural tremor; no bradykinesia.  REFLEXES: Deep tendon reflexes are symmetrical and normal.   SENSATION: Normal to light touch, temperature, and pain.       Blood pressure (!) 179/80, pulse 60, temperature 98 F (36.7 C), temperature source Oral, resp. rate 18, height 5\' 11"  (1.803 m), weight 75.2 kg, SpO2 100 %.  Past Medical History:  Diagnosis Date   AAA (abdominal aortic aneurysm) ([place]) 3-09   4.7 cm   Allergy    rhinitis   Anxiety    Cancer ([place])    skin   Cataract    Depression    Diabetes mellitus    Diverticulosis    GERD (gastroesophageal reflux disease)    Hepatitis    Hx of colonic polyps 08/26/2015   Hyperlipidemia    Hypertension    Nephrolithiasis    Personal history of colonic polyps    adenomas, serrated also   PVD (peripheral vascular disease) ([place])    Tobacco abuse     Past Surgical History:  Procedure Laterality Date   COLONOSCOPY  2006, 2008, 2010, 05/27/2011   numerous adenomas - 13 in 2006, 3, 2008, 2 2012 (up to 1 cm), 4 diminutive adenomas,  serrated adenomas 2012. Diverticulosis and hemorrhoids also.   EYE SURGERY     EYE SURGERY Left    Kidney stone operation     lower limb amputation, other toe 5th     percutaneous stent graft repair of infrarenal AAA  11/10   5.6 cm (T. early)   surgical excision basal cell carcinoma     tympanic eardrum repair     VASECTOMY      Family History  Problem Relation Age of Onset   Emphysema Father    Cancer Brother        Lung   Depression Brother    Depression Sister    Lung disease Sister        Fibrosis  and died from pneumonia on 03/30/2013   Kidney disease Sister    Diabetes Sister    Learning disabilities Sister    Mental retardation Sister    Heart disease Mother    Cancer Brother    Colon cancer Neg Hx    Dementia Neg Hx    Alcohol abuse Neg Hx    Drug abuse Neg Hx    Paranoid behavior Neg Hx    Schizophrenia Neg Hx    Anxiety disorder Neg Hx    Bipolar disorder Neg Hx    OCD Neg Hx    Sexual abuse Neg Hx    Physical abuse Neg Hx    Seizures Neg Hx    Esophageal cancer Neg Hx    Stomach cancer Neg Hx    Rectal cancer Neg Hx     Social History:  reports that he has been smoking cigarettes. He has a 60.00 pack-year smoking history. He has never used smokeless tobacco. He reports that he does not drink alcohol and does not use drugs.  Allergies:  Allergies  Allergen Reactions   Lisinopril-Hydrochlorothiazide Swelling    ZESTRIL   Doxycycline Rash    Medications: Prior to Admission medications   Medication Sig Start Date End Date Taking? Authorizing Provider  aspirin EC 81 MG tablet Take 81 mg by mouth every morning.   Yes [provider]  cetirizine (ZYRTEC) 10 MG tablet Take 1 tablet (10 mg total) by mouth daily. 09/05/20  Yes [place], Modena Nunnery, MD  clonazePAM (KLONOPIN) 0.5 MG tablet TAKE 1/2 TABLET BY MOUTHIN THE MORNING, AT NOON, IN THE EVENING, AND AT BEDTIME Patient taking differently: Take 0.5 mg by mouth. TAKE 1/2 TABLET BY MOUTHIN THE MORNING, AT NOON, AND AT BEDTIME 04/02/21  Yes Cloria [place], MD  ezetimibe (ZETIA) 10 MG tablet TAKE 1 TABLET BY MOUTH DAILY, KEEP APPOINTMENT FOR FUTURE REFILL GENERIC EQUIVALENT FOR ZETIA 06/29/21  Yes Lelon Perla, MD  FLUoxetine (PROZAC) 20 MG capsule TAKE (1) CAPSULE BY MOUTH EACH MORNING. 04/02/21  Yes Cloria Spring, MD  glimepiride (AMARYL) 1 MG tablet Take 0.5 tablets (0.5 mg total) by mouth 2 (two) times daily. Take 1/2 tablet once a day Patient taking differently: Take 0.5 mg by mouth 2 (two) times daily.  03/11/21  Yes Susy Frizzle, MD  hydrochlorothiazide (HYDRODIURIL) 25 MG tablet Take 1 tablet (25 mg total) by mouth daily. 06/04/21  Yes Susy Frizzle, MD  ibuprofen (ADVIL,MOTRIN) 200 MG tablet Take 400 mg by mouth every 4 (four) hours as needed for mild pain.   Yes [provider]  metoprolol succinate (TOPROL-XL) 25 MG 24 hr tablet TAKE ONE-HALF TABLET (12.5mg )  BY MOUTH DAILY 06/04/21  Yes Susy Frizzle, MD  montelukast (SINGULAIR)  10 MG tablet Take 1 tablet (10 mg total) by mouth at bedtime. 12/15/20  Yes [place], Modena Nunnery, MD  ramipril (ALTACE) 10 MG capsule TAKE ONE CAPSULE BY MOUTH EVERY DAY. GENERIC EQUIVALENT FOR ALTACE Patient taking differently: Take 10 mg by mouth daily. 10/07/20  Yes [place], Modena Nunnery, MD  simvastatin (ZOCOR) 40 MG tablet TAKE (1) TABLET BY MOUTH AT BEDTIME. Patient taking differently: Take 40 mg by mouth at bedtime. 05/12/21  Yes Susy Frizzle, MD  amoxicillin (AMOXIL) 500 MG tablet SMARTSIG:1 Tablet(s) By Mouth Every 12 Hours Patient not taking: No sig reported 05/25/21   [provider]  dapagliflozin propanediol (FARXIGA) 5 MG TABS tablet Take 1 tablet (5 mg total) by mouth daily before breakfast. Patient not taking: No sig reported 03/30/21   Susy Frizzle, MD  HYDROcodone-acetaminophen (NORCO/VICODIN) 5-325 MG tablet Take 1 tablet by mouth every 6 (six) hours as needed. Patient not taking: No sig reported 05/25/21   [provider]  ipratropium (ATROVENT) 0.03 % nasal spray Place 2 sprays into both nostrils every 12 (twelve) hours. Patient not taking: No sig reported 09/05/20   Alycia Rossetti, MD  NIFEdipine (ADALAT CC) 60 MG 24 hr tablet Take 15mg  once a day Patient not taking: No sig reported 12/15/20   Alycia Rossetti, MD  polyethylene glycol [facility] / GLYCOLAX) packet Take 17 g by mouth daily as needed for mild constipation.  Patient not taking: Reported on 07/27/2021    [provider]  sildenafil  (VIAGRA) 100 MG tablet TAKE 1/2 TO 1 TABLET BY MOUTH DAILY AS NEEDED FOR ERECTILE DYSFUNCTION Patient not taking: No sig reported 06/06/20   Alycia Rossetti, MD  vitamin B-12 (CYANOCOBALAMIN) 1000 MCG tablet Take 1,000 mcg by mouth daily. Patient not taking: No sig reported    [provider]    Scheduled Meds:  aspirin EC  81 mg Oral q morning   clonazePAM  0.25 mg Oral TID AC & HS   [START ON 07/28/2021] clopidogrel  75 mg Oral Daily   ezetimibe  10 mg Oral Daily   FLUoxetine  20 mg Oral Daily   heparin  5,000 Units Subcutaneous Q8H   insulin aspart  0-5 Units Subcutaneous QHS   insulin aspart  0-9 Units Subcutaneous TID WC   loratadine  10 mg Oral Daily   montelukast  10 mg Oral QHS   pantoprazole  40 mg Oral Daily   simvastatin  40 mg Oral QHS   vitamin B-12  1,000 mcg Oral Daily   Continuous Infusions:  sodium chloride 75 mL/hr at 07/27/21 1627   PRN Meds:.acetaminophen **OR** acetaminophen, HYDROcodone-acetaminophen, ipratropium-albuterol, labetalol, ondansetron **OR** ondansetron (ZOFRAN) IV     Results for orders placed or performed during the hospital encounter of 07/27/21 (from the past 48 hour(s))  CBG monitoring, ED     Status: Abnormal   Collection Time: 07/27/21 12:28 PM  Result Value Ref Range   Glucose-Capillary 160 (H) 70 - 99 mg/dL    Comment: Glucose reference range applies only to samples taken after fasting for at least 8 hours.  Ethanol     Status: None   Collection Time: 07/27/21 12:31 PM  Result Value Ref Range   Alcohol, Ethyl (B) <10 <10 mg/dL    Comment: (NOTE) Lowest detectable limit for serum alcohol is 10 mg/dL.  For medical purposes only. Performed at [facility], [address] [place], [place] [address]   Protime-INR     Status: None  Collection Time: 07/27/21 12:31 PM  Result Value Ref Range   Prothrombin Time 13.0 11.4 - 15.2 seconds   INR 1.0 0.8 - 1.2    Comment: (NOTE) INR goal varies based on device and disease  states. Performed at [facility], [address], [place], [place] [address]   APTT     Status: None   Collection Time: 07/27/21 12:31 PM  Result Value Ref Range   aPTT 29 24 - 36 seconds    Comment: Performed at [facility], [address], [place], [place] [address]  CBC     Status: None   Collection Time: 07/27/21 12:31 PM  Result Value Ref Range   WBC 9.2 4.0 - 10.5 K/uL   RBC 4.80 4.22 - 5.81 MIL/uL   Hemoglobin 15.8 13.0 - 17.0 g/dL   HCT 46.4 39.0 - 52.0 %   MCV 96.7 80.0 - 100.0 fL   MCH 32.9 26.0 - 34.0 pg   MCHC 34.1 30.0 - 36.0 g/dL   RDW 13.4 11.5 - 15.5 %   Platelets 210 150 - 400 K/uL   nRBC 0.0 0.0 - 0.2 %    Comment: Performed at [facility], [address] [place], [place] [address]  Differential     Status: None   Collection Time: 07/27/21 12:31 PM  Result Value Ref Range   Neutrophils Relative % 74 %   Neutro Abs 6.9 1.7 - 7.7 K/uL   Lymphocytes Relative 14 %   Lymphs Abs 1.3 0.7 - 4.0 K/uL   Monocytes Relative 10 %   Monocytes Absolute 0.9 0.1 - 1.0 K/uL   Eosinophils Relative 0 %   Eosinophils Absolute 0.0 0.0 - 0.5 K/uL   Basophils Relative 1 %   Basophils Absolute 0.1 0.0 - 0.1 K/uL   Immature Granulocytes 1 %   Abs Immature Granulocytes 0.06 0.00 - 0.07 K/uL    Comment: Performed at [facility], [address], [place], [place] [address]  Comprehensive metabolic panel     Status: Abnormal   Collection Time: 07/27/21 12:31 PM  Result Value Ref Range   Sodium 136 135 - 145 mmol/L   Potassium 3.7 3.5 - 5.1 mmol/L   Chloride 100 98 - 111 mmol/L   CO2 27 22 - 32 mmol/L   Glucose, Bld 174 (H) 70 - 99 mg/dL    Comment: Glucose reference range applies only to samples taken after fasting for at least 8 hours.   BUN 24 (H) 8 - 23 mg/dL   Creatinine, Ser 1.36 (H) 0.61 - 1.24 mg/dL   Calcium 9.4 8.9 - 10.3 mg/dL   Total Protein 7.9 6.5 - 8.1 g/dL   Albumin 4.4 3.5 - 5.0 g/dL   AST 25 15 - 41 U/L   ALT 17 0 - 44 U/L   Alkaline Phosphatase 49 38 - 126  U/L   Total Bilirubin 0.5 0.3 - 1.2 mg/dL   GFR, Estimated 51 (L) >60 mL/min    Comment: (NOTE) Calculated using the CKD-EPI Creatinine Equation (2021)    Anion gap 9 5 - 15    Comment: Performed at [facility], [address], [place], [place] [address]  Lipid panel     Status: None   Collection Time: 07/27/21 12:31 PM  Result Value Ref Range   Cholesterol 164 0 - 200 mg/dL   Triglycerides 81 <150 mg/dL   HDL 64 >40 mg/dL   Total CHOL/HDL Ratio 2.6 RATIO   VLDL 16 0 - 40 mg/dL  LDL Cholesterol 84 0 - 99 mg/dL    Comment:        Total Cholesterol/HDL:CHD Risk Coronary Heart Disease Risk Table                     Men   Women  1/2 Average Risk   3.4   3.3  Average Risk       5.0   4.4  2 X Average Risk   9.6   7.1  3 X Average Risk  23.4   11.0        Use the calculated Patient Ratio above and the CHD Risk Table to determine the patient's CHD Risk.        ATP III CLASSIFICATION (LDL):  <100     mg/dL   Optimal  100-129  mg/dL   Near or Above                    Optimal  130-159  mg/dL   Borderline  160-189  mg/dL   High  >190     mg/dL   Very High Performed at [place] [address] [place], [place] [address]   I-stat chem 8, ED     Status: Abnormal   Collection Time: 07/27/21 12:32 PM  Result Value Ref Range   Sodium 138 135 - 145 mmol/L   Potassium 3.7 3.5 - 5.1 mmol/L   Chloride 100 98 - 111 mmol/L   BUN 22 8 - 23 mg/dL   Creatinine, Ser 1.30 (H) 0.61 - 1.24 mg/dL   Glucose, Bld 173 (H) 70 - 99 mg/dL    Comment: Glucose reference range applies only to samples taken after fasting for at least 8 hours.   Calcium, Ion 1.18 1.15 - 1.40 mmol/L   TCO2 27 22 - 32 mmol/L   Hemoglobin 16.0 13.0 - 17.0 g/dL   HCT 47.0 39.0 - 52.0 %  Urine rapid drug screen (hosp performed)     Status: None   Collection Time: 07/27/21  1:00 PM  Result Value Ref Range   Opiates NONE DETECTED NONE DETECTED   Cocaine NONE DETECTED NONE DETECTED   Benzodiazepines NONE DETECTED NONE  DETECTED   Amphetamines NONE DETECTED NONE DETECTED   Tetrahydrocannabinol NONE DETECTED NONE DETECTED   Barbiturates NONE DETECTED NONE DETECTED    Comment: (NOTE) DRUG SCREEN FOR MEDICAL PURPOSES ONLY.  IF CONFIRMATION IS NEEDED FOR ANY PURPOSE, NOTIFY LAB WITHIN 5 DAYS.  LOWEST DETECTABLE LIMITS FOR URINE DRUG SCREEN Drug Class                     Cutoff (ng/mL) Amphetamine and metabolites    1000 Barbiturate and metabolites    200 Benzodiazepine                 662 Tricyclics and metabolites     300 Opiates and metabolites        300 Cocaine and metabolites        300 THC                            50 Performed at [facility], [address] [place], [place] [address]   Urinalysis, Routine w reflex microscopic Urine, Random     Status: Abnormal   Collection Time: 07/27/21  1:00 PM  Result Value Ref Range   Color, Urine STRAW (A) YELLOW   APPearance CLEAR CLEAR  Specific Gravity, Urine 1.012 1.005 - 1.030   pH 7.0 5.0 - 8.0   Glucose, UA NEGATIVE NEGATIVE mg/dL   Hgb urine dipstick SMALL (A) NEGATIVE   Bilirubin Urine NEGATIVE NEGATIVE   Ketones, ur NEGATIVE NEGATIVE mg/dL   Protein, ur 100 (A) NEGATIVE mg/dL   Nitrite NEGATIVE NEGATIVE   Leukocytes,Ua NEGATIVE NEGATIVE   RBC / HPF 0-5 0 - 5 RBC/hpf   WBC, UA 0-5 0 - 5 WBC/hpf   Bacteria, UA NONE SEEN NONE SEEN   Mucus PRESENT     Comment: Performed at [facility], [address] [place], [place] [address]  Resp Panel by RT-PCR (Flu A&B, Covid) Nasopharyngeal Swab     Status: None   Collection Time: 07/27/21  2:04 PM   Specimen: Nasopharyngeal Swab; Nasopharyngeal(NP) swabs in vial transport medium  Result Value Ref Range   SARS Coronavirus 2 by RT PCR NEGATIVE NEGATIVE    Comment: (NOTE) SARS-CoV-2 target nucleic acids are NOT DETECTED.  The SARS-CoV-2 RNA is generally detectable in upper respiratory specimens during the acute phase of infection. The lowest concentration of SARS-CoV-2 viral copies this assay  can detect is 138 copies/mL. A negative result does not preclude SARS-Cov-2 infection and should not be used as the sole basis for treatment or other patient management decisions. A negative result may occur with  improper specimen collection/handling, submission of specimen other than nasopharyngeal swab, presence of viral mutation(s) within the areas targeted by this assay, and inadequate number of viral copies(<138 copies/mL). A negative result must be combined with clinical observations, patient history, and epidemiological information. The expected result is Negative.  Fact Sheet for Patients:  EntrepreneurPulse.com.au  Fact Sheet for Healthcare Providers:  IncredibleEmployment.be  This test is no t yet approved or cleared by the [place] FDA and  has been authorized for detection and/or diagnosis of SARS-CoV-2 by FDA under an Emergency Use Authorization (EUA). This EUA will remain  in effect (meaning this test can be used) for the duration of the COVID-19 declaration under Section 564(b)(1) of the Act, 21 U.S.C.section 360bbb-3(b)(1), unless the authorization is terminated  or revoked sooner.       Influenza A by PCR NEGATIVE NEGATIVE   Influenza B by PCR NEGATIVE NEGATIVE    Comment: (NOTE) The Xpert Xpress SARS-CoV-2/FLU/RSV plus assay is intended as an aid in the diagnosis of influenza from Nasopharyngeal swab specimens and should not be used as a sole basis for treatment. Nasal washings and aspirates are unacceptable for Xpert Xpress SARS-CoV-2/FLU/RSV testing.  Fact Sheet for Patients: EntrepreneurPulse.com.au  Fact Sheet for Healthcare Providers: IncredibleEmployment.be  This test is not yet approved or cleared by the [place] FDA and has been authorized for detection and/or diagnosis of SARS-CoV-2 by FDA under an Emergency Use Authorization (EUA). This EUA will remain in effect  (meaning this test can be used) for the duration of the COVID-19 declaration under Section 564(b)(1) of the Act, 21 U.S.C. section 360bbb-3(b)(1), unless the authorization is terminated or revoked.  Performed at [facility], [address], [place], [place] [address]   Glucose, capillary     Status: Abnormal   Collection Time: 07/27/21  3:29 PM  Result Value Ref Range   Glucose-Capillary 142 (H) 70 - 99 mg/dL    Comment: Glucose reference range applies only to samples taken after fasting for at least 8 hours.    Studies/Results:    BRAIN MRI FINDINGS: Brain: Restricted diffusion in the right posterior lentiform nucleus, right thalamus, and  tail of the caudate (series 5, image 18, with additional punctate infarct lateral left thalamus/medial left lentiform nucleus (series 5, image 18). Possible punctate focus in the left lentiform nucleus (series 5, image 17), with possible ADC correlate. Confluent T2 hyperintense signal in the periventricular white matter and pons, likely the sequela of severe chronic small vessel ischemic disease. Foci of susceptibility in the posterior left frontal lobe (series 9, images 52 and 47), likely sequela of remote hypertensive microhemorrhage.   Vascular: Normal flow voids.   Skull and upper cervical spine: Normal marrow signal.   Sinuses/Orbits: Negative.  Status post bilateral lens replacements.   Other: Fluid throughout the left mastoid air cells.   IMPRESSION: Acute infarcts in the right posterior lentiform nucleus, thalamus, and tail of the caudate, with additional punctate infarct in the lateral left thalamus and possibly the left lentiform nucleus. Given multiple vascular territories, consider an embolic etiology.  HEAD NECK CTA IMPRESSION: 1. Mild atherosclerotic plaque in the bilateral carotid bulbs resulting in up to 30-40% stenosis on the right. Patent vertebral arteries. 2. Calcified atherosclerotic plaque of the bilateral  intracranial ICAs resulting in mild to moderate stenosis on the right and mild stenosis on the left, not significantly changed. 3. Mild focal stenosis of the proximal left P2 segment. Otherwise, patent intracranial vasculature. 4. 1.0 cm enhancing lesion in the right parotid gland is indeterminate with differential including both benign and malignant etiologies. Recommend ENT     HEAD CT IMPRESSION: 1. No acute intracranial pathology. 2. ASPECTS is 10 3. Mild parenchymal volume loss and advanced chronic white matter microangiopathy.      The brain MRI is reviewed in person and shows multiple small infarcts involving the posterior limb of the internal capsule, thalamic mucus and centrum semiovale/ corona radiata out on the right side. There is also a couple seen on the left side involving the basal ganglia. Remote hemorrhages noted involving the left frontal area. There is also remote infarct involving the inferior left cerebellum and basal ganglia. These are small infarcts. There is confluent chronic microvascular changes.     Madisyn Mawhinney A. Merlene Laughter, M.D.  Diplomate, Tax adviser of Psychiatry and Neurology ( Neurology). 07/27/2021, 4:59 PM

## 2021-07-27 NOTE — Consult Note (Signed)
[facility] Telemedicine Consult   Requesting Provider: Carin Hock Consult Participants: Patient, bedside nurse Location of the provider: [facility] Location of the patient: [facility]  This consult was provided via telemedicine with 2-way video and audio communication. The patient/family was informed that care would be provided in this way and agreed to receive care in this manner.    Chief Complaint: Unsteadiness  HPI: 87 year old male with a history of chronic "dizziness" and vertigo who reports significantly worsening symptoms this morning.  He typically reports that his symptoms are only present when standing, and when he got up this morning, he felt himself to be more unsteady than typical.  Over the course of the morning, this is worsened and he has noticed symptoms on his left side that are asymmetric, which is very unusual for him and therefore he presented to the emergency department.  Due to the symptoms, a code stroke was activated and he was taken for an emergent head CT which was negative for any type of acute finding.  Of note, he recently had an MRA head and neck performed by his PCP to evaluate for posterior circulation insufficiency, which was negative.   LKW: 10/09 prior to bed.  tpa given?: No, out of window IR Thrombectomy? No, no LVO signs Time of teleneurologist evaluation: 12:18  Exam: Vitals:   07/27/21 1215  BP: 134/70  Pulse: 68  Resp: 18  Temp: 97.7 F (36.5 C)  SpO2: 98%    General: In bed, NAD Eyes: No clear ataxia on smooth pursuit  1A: Level of Consciousness - 0 1B: Ask Month and Age - 0 1C: 'Blink Eyes' & 'Squeeze Hands' - 0 2: Test Horizontal Extraocular Movements - 0 3: Test Visual Fields - 0 4: Test Facial Palsy - 0 5A: Test Left Arm Motor Drift - 1 5B: Test Right Arm Motor Drift - 0 6A: Test Left Leg Motor Drift - 1 6B: Test Right Leg Motor Drift - 0 7: Test Limb Ataxia - 1 8: Test  Sensation - 0 9: Test Language/Aphasia- 0 10: Test Dysarthria - 0 11: Test Extinction/Inattention - 0 NIHSS score: 3   Imaging Reviewed: CT head-no acute intracranial abnormality  Labs reviewed in epic and pertinent values follow: Creatinine 1.3   Assessment: 87 year old male with new onset focal ataxia +/- mild weakness of the left side most consistent with a small ischemic stroke.  With no findings of nystagmus on extraocular movements, I suspect it is likely pontine or subcortical white matter as opposed to cerebellar, but would not rule this out either.  In any case, he will need to be admitted for physical therapy and secondary risk factor modification.  Recommendations:  1) CTA head neck 2) aspirin 81 mg and Plavix 75 mg daily after 300 mg load 3) echo, A1c, lipids 4) PT, OT, ST 5) routine inpatient neurological consultation   This patient is receiving care for possible acute neurological changes. There was 3 minutes of care by this provider at the time of service, including time for direct evaluation via telemedicine, review of medical records, imaging studies and discussion of findings with providers, the patient and/or family.  Roland Rack, MD [facility] [phone]  If 7pm- 7am, please page neurology on call as listed in [place].

## 2021-07-27 NOTE — Progress Notes (Signed)
   07/27/21 1732  Assess: MEWS Score  BP (!) 203/84  Pulse Rate 63  Resp 20  SpO2 100 %  Assess: MEWS Score  MEWS Temp 0  MEWS Systolic 2  MEWS Pulse 0  MEWS RR 0  MEWS LOC 0  MEWS Score 2  MEWS Score Color Yellow  Assess: if the MEWS score is Yellow or Red  Were vital signs taken at a resting state? No  Focused Assessment No change from prior assessment  Early Detection of Sepsis Score *See Row Information* Low  MEWS guidelines implemented *See Row Information* No, vital signs rechecked

## 2021-07-27 NOTE — H&P (Signed)
History and Physical    Barry [name] GXQ:[id number] DOB: [birth year] DOA: 07/27/2021  PCP: Susy Frizzle, MD   Patient coming from: Home  Chief Complaint: L hemiparesis  HPI: Barry [name] is a 87 y.o. male with medical history significant for CAD, COPD, type 2 diabetes, hypertension, GERD, allergic rhinitis, and protein calorie malnutrition who presented to the ED with complaints of weakness and vertigo for quite some time now.  He cannot state an exact timeframe, but states that both of his legs were weak bilaterally this morning as he was standing on his porch.  This was at approximately 0930.  After he fell he noticed that his left arm was quite weak as well.  He states that he gets headaches intermittently, but denies any speech slurring, or vision changes.  He states that he continues to have some jerking to his legs intermittently as well.  He states that he has been compliant on his home medications otherwise.  He has recently had MRA of the head and neck performed by his PCP to evaluate for posterior circulation insufficiency which was negative.   ED Course: Stable vital signs noted and he is afebrile.  Creatinine is 1.3 and appears to be near his baseline.  CT of the head with no acute findings and CTA with right carotid stenosis of 30-40%, but no LVO.  He does have a right parotid 1 cm mass that we will need ENT follow-up.  He has been started on Plavix and given IV fluid.  Neurology recommends usual stroke work-up with inpatient neurology evaluation.  Review of Systems: Reviewed as noted above, otherwise negative.  Past Medical History:  Diagnosis Date   AAA (abdominal aortic aneurysm) ([place]) 3-09   4.7 cm   Allergy    rhinitis   Anxiety    Cancer ([place])    skin   Cataract    Depression    Diabetes mellitus    Diverticulosis    GERD (gastroesophageal reflux disease)    Hepatitis    Hx of colonic polyps 08/26/2015   Hyperlipidemia    Hypertension     Nephrolithiasis    Personal history of colonic polyps    adenomas, serrated also   PVD (peripheral vascular disease) ([place])    Tobacco abuse     Past Surgical History:  Procedure Laterality Date   COLONOSCOPY  2006, 2008, 2010, 05/27/2011   numerous adenomas - 13 in 2006, 3, 2008, 2 2012 (up to 1 cm), 4 diminutive adenomas, serrated adenomas 2012. Diverticulosis and hemorrhoids also.   EYE SURGERY     EYE SURGERY Left    Kidney stone operation     lower limb amputation, other toe 5th     percutaneous stent graft repair of infrarenal AAA  11/10   5.6 cm (T. early)   surgical excision basal cell carcinoma     tympanic eardrum repair     VASECTOMY       reports that he has been smoking cigarettes. He has a 60.00 pack-year smoking history. He has never used smokeless tobacco. He reports that he does not drink alcohol and does not use drugs.  Allergies  Allergen Reactions   Lisinopril-Hydrochlorothiazide Swelling    ZESTRIL   Doxycycline Rash    Family History  Problem Relation Age of Onset   Emphysema Father    Cancer Brother        Lung   Depression Brother    Depression Sister    Lung disease  Sister        Fibrosis and died from pneumonia on 04-11-13   Kidney disease Sister    Diabetes Sister    Learning disabilities Sister    Mental retardation Sister    Heart disease Mother    Cancer Brother    Colon cancer Neg Hx    Dementia Neg Hx    Alcohol abuse Neg Hx    Drug abuse Neg Hx    Paranoid behavior Neg Hx    Schizophrenia Neg Hx    Anxiety disorder Neg Hx    Bipolar disorder Neg Hx    OCD Neg Hx    Sexual abuse Neg Hx    Physical abuse Neg Hx    Seizures Neg Hx    Esophageal cancer Neg Hx    Stomach cancer Neg Hx    Rectal cancer Neg Hx     Prior to Admission medications   Medication Sig Start Date End Date Taking? Authorizing Provider  aspirin EC 81 MG tablet Take 81 mg by mouth every morning.    [provider]  cetirizine (ZYRTEC) 10 MG tablet  Take 1 tablet (10 mg total) by mouth daily. 09/05/20   [place], Modena Nunnery, MD  clonazePAM (KLONOPIN) 0.5 MG tablet TAKE 1/2 TABLET BY MOUTHIN THE MORNING, AT NOON, IN THE EVENING, AND AT BEDTIME 04/02/21   Cloria Spring, MD  dapagliflozin propanediol (FARXIGA) 5 MG TABS tablet Take 1 tablet (5 mg total) by mouth daily before breakfast. 03/30/21   Susy Frizzle, MD  ezetimibe (ZETIA) 10 MG tablet TAKE 1 TABLET BY MOUTH DAILY, KEEP APPOINTMENT FOR FUTURE REFILL GENERIC EQUIVALENT FOR ZETIA 06/29/21   Lelon Perla, MD  FLUoxetine (PROZAC) 20 MG capsule TAKE (1) CAPSULE BY MOUTH EACH MORNING. 04/02/21   Cloria Spring, MD  glimepiride (AMARYL) 1 MG tablet Take 0.5 tablets (0.5 mg total) by mouth 2 (two) times daily. Take 1/2 tablet once a day 03/11/21   Susy Frizzle, MD  hydrochlorothiazide (HYDRODIURIL) 25 MG tablet Take 1 tablet (25 mg total) by mouth daily. 06/04/21   Susy Frizzle, MD  ibuprofen (ADVIL,MOTRIN) 200 MG tablet Take 400 mg by mouth every 4 (four) hours as needed for mild pain.    [provider]  ipratropium (ATROVENT) 0.03 % nasal spray Place 2 sprays into both nostrils every 12 (twelve) hours. 09/05/20   [place], Modena Nunnery, MD  metoprolol succinate (TOPROL-XL) 25 MG 24 hr tablet TAKE ONE-HALF TABLET (12.5mg )  BY MOUTH DAILY 06/04/21   Susy Frizzle, MD  montelukast (SINGULAIR) 10 MG tablet Take 1 tablet (10 mg total) by mouth at bedtime. 12/15/20   Alycia Rossetti, MD  NIFEdipine (ADALAT CC) 60 MG 24 hr tablet Take 15mg  once a day 12/15/20   Alycia Rossetti, MD  polyethylene glycol [facility] / Floria Raveling) packet Take 17 g by mouth daily as needed for mild constipation.     [provider]  ramipril (ALTACE) 10 MG capsule TAKE ONE CAPSULE BY MOUTH EVERY DAY. GENERIC EQUIVALENT FOR ALTACE 10/07/20   Alycia Rossetti, MD  sildenafil (VIAGRA) 100 MG tablet TAKE 1/2 TO 1 TABLET BY MOUTH DAILY AS NEEDED FOR ERECTILE DYSFUNCTION 06/06/20   Alycia Rossetti, MD   simvastatin (ZOCOR) 40 MG tablet TAKE (1) TABLET BY MOUTH AT BEDTIME. 05/12/21   Susy Frizzle, MD  vitamin B-12 (CYANOCOBALAMIN) 1000 MCG tablet Take 1,000 mcg by mouth daily.    [provider]  Physical Exam: Vitals:   07/27/21 1215 07/27/21 1304 07/27/21 1330  BP: 134/70 (!) 173/67 (!) 150/67  Pulse: 68 62 61  Resp: 18 16 18   Temp: 97.7 F (36.5 C)    SpO2: 98% 100% 99%    Constitutional: NAD, calm, comfortable Vitals:   07/27/21 1215 07/27/21 1304 07/27/21 1330  BP: 134/70 (!) 173/67 (!) 150/67  Pulse: 68 62 61  Resp: 18 16 18   Temp: 97.7 F (36.5 C)    SpO2: 98% 100% 99%   Eyes: lids and conjunctivae normal Neck: normal, supple Respiratory: clear to auscultation bilaterally. Normal respiratory effort. No accessory muscle use.  Cardiovascular: Regular rate and rhythm, no murmurs. Abdomen: no tenderness, no distention. Bowel sounds positive.  Musculoskeletal:  No edema. Skin: no rashes, lesions, ulcers.  Psychiatric: Flat affect  Labs on Admission: I have personally reviewed following labs and imaging studies  CBC: Recent Labs  Lab 07/27/21 1231 07/27/21 1232  WBC 9.2  --   NEUTROABS 6.9  --   HGB 15.8 16.0  HCT 46.4 47.0  MCV 96.7  --   PLT 210  --    Basic Metabolic Panel: Recent Labs  Lab 07/27/21 1231 07/27/21 1232  NA 136 138  K 3.7 3.7  CL 100 100  CO2 27  --   GLUCOSE 174* 173*  BUN 24* 22  CREATININE 1.36* 1.30*  CALCIUM 9.4  --    GFR: CrCl cannot be calculated (Unknown ideal weight.). Liver Function Tests: Recent Labs  Lab 07/27/21 1231  AST 25  ALT 17  ALKPHOS 49  BILITOT 0.5  PROT 7.9  ALBUMIN 4.4   No results for input(s): LIPASE, AMYLASE in the last 168 hours. No results for input(s): AMMONIA in the last 168 hours. Coagulation Profile: Recent Labs  Lab 07/27/21 1231  INR 1.0   Cardiac Enzymes: No results for input(s): CKTOTAL, CKMB, CKMBINDEX, TROPONINI in the last 168 hours. BNP (last 3  results) No results for input(s): PROBNP in the last 8760 hours. HbA1C: No results for input(s): HGBA1C in the last 72 hours. CBG: Recent Labs  Lab 07/27/21 1228  GLUCAP 160*   Lipid Profile: No results for input(s): CHOL, HDL, LDLCALC, TRIG, CHOLHDL, LDLDIRECT in the last 72 hours. Thyroid Function Tests: No results for input(s): TSH, T4TOTAL, FREET4, T3FREE, THYROIDAB in the last 72 hours. Anemia Panel: No results for input(s): VITAMINB12, FOLATE, FERRITIN, TIBC, IRON, RETICCTPCT in the last 72 hours. Urine analysis:    Component Value Date/Time   COLORURINE STRAW (A) 07/27/2021 1300   APPEARANCEUR CLEAR 07/27/2021 1300   LABSPEC 1.012 07/27/2021 1300   PHURINE 7.0 07/27/2021 1300   GLUCOSEU NEGATIVE 07/27/2021 1300   HGBUR SMALL (A) 07/27/2021 1300   BILIRUBINUR NEGATIVE 07/27/2021 1300   KETONESUR NEGATIVE 07/27/2021 1300   PROTEINUR 100 (A) 07/27/2021 1300   UROBILINOGEN 0.2 09/12/2009 1019   NITRITE NEGATIVE 07/27/2021 1300   LEUKOCYTESUR NEGATIVE 07/27/2021 1300    Radiological Exams on Admission: CT HEAD CODE STROKE WO CONTRAST  Result Date: 07/27/2021 CLINICAL DATA:  Code stroke. Left arm weakness starting at 9:30 this morning EXAM: CT HEAD WITHOUT CONTRAST TECHNIQUE: Contiguous axial images were obtained from the base of the skull through the vertex without intravenous contrast. COMPARISON:  CT head 10/27/2018, brain MRI 03/13/2021 FINDINGS: Brain: There is no evidence of acute intracranial hemorrhage, extra-axial fluid collection, or acute infarct. There is mild parenchymal volume loss. Confluent hypodensity throughout the subcortical and periventricular white matter likely reflects sequela of advanced  chronic white matter microangiopathy. The ventricles are stable in size. There is no mass lesion.  There is no midline shift. Vascular: There is calcification of the bilateral cavernous ICAs and vertebral arteries. There is no dense vessel. Skull: Normal. Negative for  fracture or focal lesion. Sinuses/Orbits: The paranasal sinuses are clear. Bilateral lens implants are in place. The globes and orbits are otherwise unremarkable. Other: A left mastoid effusion is again seen. ASPECTS [facility] Stroke Program Early CT Score) - Ganglionic level infarction (caudate, lentiform nuclei, internal capsule, insula, M1-M3 cortex): 7 - Supraganglionic infarction (M4-M6 cortex): 3 Total score (0-10 with 10 being normal): 10 IMPRESSION: 1. No acute intracranial pathology. 2. ASPECTS is 10 3. Mild parenchymal volume loss and advanced chronic white matter microangiopathy. These results were called by telephone at the time of interpretation on 07/27/2021 at 12:27 pm to provider [facility] , who verbally acknowledged these results. Electronically Signed   By: Valetta Mole M.D.   On: 07/27/2021 12:27   CT ANGIO HEAD NECK W WO CM (CODE STROKE)  Result Date: 07/27/2021 CLINICAL DATA:  Left-sided weakness EXAM: CT ANGIOGRAPHY HEAD AND NECK TECHNIQUE: Multidetector CT imaging of the head and neck was performed using the standard protocol during bolus administration of intravenous contrast. Multiplanar CT image reconstructions and MIPs were obtained to evaluate the vascular anatomy. Carotid stenosis measurements (when applicable) are obtained utilizing NASCET criteria, using the distal internal carotid diameter as the denominator. CONTRAST:  53mL OMNIPAQUE IOHEXOL 350 MG/ML SOLN COMPARISON:  Same-day noncontrast CT head, MRA head/neck 03/13/2021 FINDINGS: CTA NECK FINDINGS Aortic arch: There is calcified atherosclerotic plaque of the aortic arch and in the proximal great vessels. There is multifocal irregularity of the right subclavian artery without high-grade stenosis or occlusion. There is no evidence of dissection or aneurysm. Right carotid system: There is scattered calcified atherosclerotic plaque in the right common carotid artery without hemodynamically significant stenosis. There is soft  and calcified atherosclerotic plaque of the proximal right internal carotid artery resulting in up to 30-40% stenosis. There is no evidence of dissection or aneurysm. Left carotid system: There is mild calcified atherosclerotic plaque in the left common carotid artery without hemodynamically significant stenosis. There is mild calcified atherosclerotic plaque in the proximal left internal carotid artery without hemodynamically significant stenosis or occlusion. There is no dissection or aneurysm. Vertebral arteries: There is mild calcified atherosclerotic plaque in the proximal right vertebral artery and at the V2/V3 junction without hemodynamically significant stenosis or occlusion. The remainder of the right vertebral artery is patent. There is no dissection or aneurysm. The left vertebral artery is2 smaller than the right, a normal variant. There is no hemodynamically significant stenosis or occlusion. There is no dissection or aneurysm. Skeleton: There is multilevel degenerative change of the cervical spine, most advanced at C5-C6. Other neck: There is a subcentimeter left thyroid nodule. Small bilateral palatine tonsilliths are noted. There is a 1.0 cm enhancing lesion in the right parotid gland. Upper chest: There is emphysema in the lung apices. There is cavitation in the medial left upper lobe, incompletely imaged but also present on the prior CT chest from 12/21/2018. Review of the MIP images confirms the above findings CTA HEAD FINDINGS Anterior circulation: There is calcified atherosclerotic plaque of the bilateral intracranial carotid arteries resulting in mild to moderate stenosis on the right and mild stenosis on the left, similar to the prior study. There is no evidence of occlusion. The bilateral MCAs are patent. The bilateral ACAs are patent. There is  no aneurysm. Posterior circulation: There is mild calcified atherosclerotic plaque of the right V4 segment without hemodynamically significant  stenosis or occlusion. The left V4 segment is patent. PICA is patent bilaterally. There is mild focal stenosis of the the proximal left P2 segment (8-117), similar to the prior study. Basilar artery is patent. The bilateral PCAs are patent. There is no aneurysm. Venous sinuses: Patent. Anatomic variants: None. Review of the MIP images confirms the above findings IMPRESSION: 1. Mild atherosclerotic plaque in the bilateral carotid bulbs resulting in up to 30-40% stenosis on the right. Patent vertebral arteries. 2. Calcified atherosclerotic plaque of the bilateral intracranial ICAs resulting in mild to moderate stenosis on the right and mild stenosis on the left, not significantly changed. 3. Mild focal stenosis of the proximal left P2 segment. Otherwise, patent intracranial vasculature. 4. 1.0 cm enhancing lesion in the right parotid gland is indeterminate with differential including both benign and malignant etiologies. Recommend ENT referral. Aortic Atherosclerosis (ICD10-I70.0) and Emphysema (ICD10-J43.9). Electronically Signed   By: Valetta Mole M.D.   On: 07/27/2021 13:19    EKG: Independently reviewed. SR 58bpm.  Assessment/Plan Active Problems:   CVA (cerebral vascular accident) ([place])    TIA versus CVA -With noted left-sided hemiparesis -Brain MRI -PT/OT/SLP evaluation -Appreciate neurology evaluation -Continue Plavix and aspirin after 300 mg load -2D echocardiogram -Fasting lipid profile, hemoglobin A1c  Right-sided parotid mass -Incidental finding which will require ENT follow-up in outpatient setting  CKD stage IIIa -Baseline creatinine 1.1-1.3 -Continue to monitor  Hypertension -Hold antihypertensives and allow permissive hypertension  CAD -Continue on Plavix as ordered -Continue statin  COPD with ongoing tobacco abuse -Counseled on cessation -Bronchodilators as needed  Type 2 diabetes -SSI -Check hemoglobin A1c  GERD -PPI  DVT prophylaxis: Heparin Code Status:  Full Family Communication: Son at bedside 10/10 Disposition Plan:Admit for CVA evaluation Consults called:Neurology Admission status: Inpatient, Tele  Densil Ottey D Jamariya Davidoff DO [facility]  If 7PM-7AM, please contact night-coverage [email or web address]  07/27/2021, 2:03 PM

## 2021-07-27 NOTE — Plan of Care (Signed)

## 2021-07-27 NOTE — ED Notes (Signed)
Unable to obtain vitals - patient in CT

## 2021-07-27 NOTE — ED Provider Notes (Signed)
[facility] EMERGENCY DEPARTMENT Provider Note   CSN: [id number] Arrival date & time: 07/27/21  1142  An emergency department physician performed an initial assessment on this suspected stroke patient at 1211.  History Chief Complaint  Patient presents with   Weakness    Barry [name] is a 87 y.o. male.  He has a history of diabetes hypertension AAA skin cancer.  He said he is felt weak for over a month in general.  Also has a history of dizziness.  He was at his baseline this morning and took some coffee out on the porch.  At 9:30 AM he attempted to stand up and his left leg was weak and gave way.  He also noticed some weakness in his left arm.  He also tells me his left arm has been in pain since he fell about a month ago on it so it is hard to figure out if his arm weakness is a new problem or old.  Denies any headache.  No neck or chest pain.  Not on any blood thinners.  The history is provided by the patient.  Cerebrovascular Accident This is a new problem. The current episode started 3 to 5 hours ago. The problem occurs constantly. The problem has not changed since onset.Pertinent negatives include no chest pain, no abdominal pain, no headaches and no shortness of breath. Nothing aggravates the symptoms. Nothing relieves the symptoms. He has tried nothing for the symptoms. The treatment provided no relief.      Past Medical History:  Diagnosis Date   AAA (abdominal aortic aneurysm) ([place]) 3-09   4.7 cm   Allergy    rhinitis   Anxiety    Cancer ([place])    skin   Cataract    Depression    Diabetes mellitus    Diverticulosis    GERD (gastroesophageal reflux disease)    Hepatitis    Hx of colonic polyps 08/26/2015   Hyperlipidemia    Hypertension    Nephrolithiasis    Personal history of colonic polyps    adenomas, serrated also   PVD (peripheral vascular disease) ([place])    Tobacco abuse     Patient Active Problem List   Diagnosis Date Noted   Frequent PVCs  03/20/2020   Aortic stenosis 03/20/2020   COPD (chronic obstructive pulmonary disease) ([place]) 03/20/2020   Constipation 04/30/2019   Protein-calorie malnutrition ([place]) 04/30/2019   Anxiety with depression 12/05/2018   Seasonal and perennial allergic rhinitis 08/09/2018   Carotid artery disease ([place]) 11/18/2017   Hx of colonic polyps 08/26/2015   Erectile dysfunction 02/17/2015   AAA (abdominal aortic aneurysm) without rupture ([place]) 05/21/2014   Chest pain 01/31/2014   CAD (coronary artery disease) 12/24/2013   HEARING LOSS, CONDUCTIVE, LEFT 02/23/2010   DIZZINESS, CHRONIC 05/28/2008   PULMONARY NODULE 09/07/2007   Diabetes mellitus type II, controlled ([place]) 09/06/2007   Hyperlipidemia 09/06/2007   TOBACCO ABUSE 09/06/2007   Essential hypertension 09/06/2007   Allergic rhinitis 09/06/2007   GERD 09/06/2007   BASAL CELL CARCINOMA, NOSE 09/06/2007    Past Surgical History:  Procedure Laterality Date   COLONOSCOPY  2006, 2008, 2010, 05/27/2011   numerous adenomas - 13 in 2006, 3, 2008, 2 2012 (up to 1 cm), 4 diminutive adenomas, serrated adenomas 2012. Diverticulosis and hemorrhoids also.   EYE SURGERY     EYE SURGERY Left    Kidney stone operation     lower limb amputation, other toe 5th     percutaneous stent  graft repair of infrarenal AAA  11/10   5.6 cm (T. early)   surgical excision basal cell carcinoma     tympanic eardrum repair     VASECTOMY         Family History  Problem Relation Age of Onset   Emphysema Father    Cancer Brother        Lung   Depression Brother    Depression Sister    Lung disease Sister        Fibrosis and died from pneumonia on 03-27-2013   Kidney disease Sister    Diabetes Sister    Learning disabilities Sister    Mental retardation Sister    Heart disease Mother    Cancer Brother    Colon cancer Neg Hx    Dementia Neg Hx    Alcohol abuse Neg Hx    Drug abuse Neg Hx    Paranoid behavior Neg Hx    Schizophrenia Neg Hx    Anxiety  disorder Neg Hx    Bipolar disorder Neg Hx    OCD Neg Hx    Sexual abuse Neg Hx    Physical abuse Neg Hx    Seizures Neg Hx    Esophageal cancer Neg Hx    Stomach cancer Neg Hx    Rectal cancer Neg Hx     Social History   Tobacco Use   Smoking status: Every Day    Packs/day: 1.00    Years: 60.00    Pack years: 60.00    Types: Cigarettes   Smokeless tobacco: Never   Tobacco comments:    smokes about 16-20 cigs a day as of 04/04/2013  Vaping Use   Vaping Use: Never used  Substance Use Topics   Alcohol use: No   Drug use: No    Home Medications Prior to Admission medications   Medication Sig Start Date End Date Taking? Authorizing Provider  aspirin EC 81 MG tablet Take 81 mg by mouth every morning.    [provider]  cetirizine (ZYRTEC) 10 MG tablet Take 1 tablet (10 mg total) by mouth daily. 09/05/20   [place], Modena Nunnery, MD  clonazePAM (KLONOPIN) 0.5 MG tablet TAKE 1/2 TABLET BY MOUTHIN THE MORNING, AT NOON, IN THE EVENING, AND AT BEDTIME 04/02/21   Cloria Spring, MD  dapagliflozin propanediol (FARXIGA) 5 MG TABS tablet Take 1 tablet (5 mg total) by mouth daily before breakfast. 03/30/21   Susy Frizzle, MD  ezetimibe (ZETIA) 10 MG tablet TAKE 1 TABLET BY MOUTH DAILY, KEEP APPOINTMENT FOR FUTURE REFILL GENERIC EQUIVALENT FOR ZETIA 06/29/21   Lelon Perla, MD  FLUoxetine (PROZAC) 20 MG capsule TAKE (1) CAPSULE BY MOUTH EACH MORNING. 04/02/21   Cloria Spring, MD  glimepiride (AMARYL) 1 MG tablet Take 0.5 tablets (0.5 mg total) by mouth 2 (two) times daily. Take 1/2 tablet once a day 03/11/21   Susy Frizzle, MD  hydrochlorothiazide (HYDRODIURIL) 25 MG tablet Take 1 tablet (25 mg total) by mouth daily. 06/04/21   Susy Frizzle, MD  ibuprofen (ADVIL,MOTRIN) 200 MG tablet Take 400 mg by mouth every 4 (four) hours as needed for mild pain.    [provider]  ipratropium (ATROVENT) 0.03 % nasal spray Place 2 sprays into both nostrils every 12 (twelve)  hours. 09/05/20   Alycia Rossetti, MD  metoprolol succinate (TOPROL-XL) 25 MG 24 hr tablet TAKE ONE-HALF TABLET (12.5mg )  BY MOUTH DAILY 06/04/21   Susy Frizzle,  MD  montelukast (SINGULAIR) 10 MG tablet Take 1 tablet (10 mg total) by mouth at bedtime. 12/15/20   Alycia Rossetti, MD  NIFEdipine (ADALAT CC) 60 MG 24 hr tablet Take 15mg  once a day 12/15/20   Alycia Rossetti, MD  polyethylene glycol [facility] / Floria Raveling) packet Take 17 g by mouth daily as needed for mild constipation.     [provider]  ramipril (ALTACE) 10 MG capsule TAKE ONE CAPSULE BY MOUTH EVERY DAY. GENERIC EQUIVALENT FOR ALTACE 10/07/20   Alycia Rossetti, MD  sildenafil (VIAGRA) 100 MG tablet TAKE 1/2 TO 1 TABLET BY MOUTH DAILY AS NEEDED FOR ERECTILE DYSFUNCTION 06/06/20   Alycia Rossetti, MD  simvastatin (ZOCOR) 40 MG tablet TAKE (1) TABLET BY MOUTH AT BEDTIME. 05/12/21   Susy Frizzle, MD  vitamin B-12 (CYANOCOBALAMIN) 1000 MCG tablet Take 1,000 mcg by mouth daily.    [provider]    Allergies    Lisinopril-hydrochlorothiazide and Doxycycline  Review of Systems   Review of Systems  Constitutional:  Negative for fever.  HENT:  Negative for sore throat.   Eyes:  Negative for visual disturbance.  Respiratory:  Negative for shortness of breath.   Cardiovascular:  Negative for chest pain.  Gastrointestinal:  Negative for abdominal pain.  Genitourinary:  Negative for dysuria.  Musculoskeletal:  Positive for gait problem. Negative for neck pain.  Skin:  Negative for rash.  Neurological:  Positive for weakness. Negative for headaches.   Physical Exam Updated Vital Signs There were no vitals taken for this visit.  Physical Exam Vitals and nursing note reviewed.  Constitutional:      Appearance: Normal appearance. He is well-developed.  HENT:     Head: Normocephalic and atraumatic.  Eyes:     Conjunctiva/sclera: Conjunctivae normal.  Cardiovascular:     Rate and Rhythm: Normal  rate and regular rhythm.     Heart sounds: Murmur heard.  Pulmonary:     Effort: Pulmonary effort is normal. No respiratory distress.     Breath sounds: Normal breath sounds.  Abdominal:     Palpations: Abdomen is soft.     Tenderness: There is no abdominal tenderness.  Musculoskeletal:     Cervical back: Neck supple.  Skin:    General: Skin is warm and dry.     Capillary Refill: Capillary refill takes less than 2 seconds.  Neurological:     Mental Status: He is alert and oriented to person, place, and time.     Cranial Nerves: No cranial nerve deficit.     Comments: Patient is awake and alert.  No dysarthria.  No obvious facial asymmetry.  Patient has some drift in his left arm and slight weakness in left arm and left leg compared to right.  Normal sensation.    ED Results / Procedures / Treatments   Labs (all labs ordered are listed, but only abnormal results are displayed) Labs Reviewed  COMPREHENSIVE METABOLIC PANEL - Abnormal; Notable for the following components:      Result Value   Glucose, Bld 174 (*)    BUN 24 (*)    Creatinine, Ser 1.36 (*)    GFR, Estimated 51 (*)    All other components within normal limits  URINALYSIS, ROUTINE W REFLEX MICROSCOPIC - Abnormal; Notable for the following components:   Color, Urine STRAW (*)    Hgb urine dipstick SMALL (*)    Protein, ur 100 (*)    All other components within normal limits  GLUCOSE, CAPILLARY - Abnormal; Notable for the following components:   Glucose-Capillary 142 (*)    All other components within normal limits  GLUCOSE, CAPILLARY - Abnormal; Notable for the following components:   Glucose-Capillary 150 (*)    All other components within normal limits  GLUCOSE, CAPILLARY - Abnormal; Notable for the following components:   Glucose-Capillary 194 (*)    All other components within normal limits  I-STAT CHEM 8, ED - Abnormal; Notable for the following components:   Creatinine, Ser 1.30 (*)    Glucose, Bld 173 (*)     All other components within normal limits  CBG MONITORING, ED - Abnormal; Notable for the following components:   Glucose-Capillary 160 (*)    All other components within normal limits  RESP PANEL BY RT-PCR (FLU A&B, COVID) ARPGX2  ETHANOL  PROTIME-INR  APTT  CBC  DIFFERENTIAL  RAPID URINE DRUG SCREEN, HOSP PERFORMED  LIPID PANEL  HEMOGLOBIN A1C  MAGNESIUM  BASIC METABOLIC PANEL  CBC    EKG EKG Interpretation  Date/Time:  Monday July 27 2021 11:57:36 EDT Ventricular Rate:  58 PR Interval:  165 QRS Duration: 100 QT Interval:  427 QTC Calculation: 420 R Axis:   27 Text Interpretation: Sinus rhythm Anterior infarct, old Borderline T abnormalities, inferior leads No significant change since prior 3/20 Confirmed by Aletta Edouard [phone]) on 07/27/2021 12:03:14 PM  Radiology MR BRAIN WO CONTRAST  Result Date: 07/27/2021 CLINICAL DATA:  Neuro deficit, stroke suspected EXAM: MRI HEAD WITHOUT CONTRAST TECHNIQUE: Multiplanar, multiecho pulse sequences of the brain and surrounding structures were obtained without intravenous contrast. COMPARISON:  Same day CT head, MRI 12/17/2018. FINDINGS: Brain: Restricted diffusion in the right posterior lentiform nucleus, right thalamus, and tail of the caudate (series 5, image 18, with additional punctate infarct lateral left thalamus/medial left lentiform nucleus (series 5, image 18). Possible punctate focus in the left lentiform nucleus (series 5, image 17), with possible ADC correlate. Confluent T2 hyperintense signal in the periventricular white matter and pons, likely the sequela of severe chronic small vessel ischemic disease. Foci of susceptibility in the posterior left frontal lobe (series 9, images 52 and 47), likely sequela of remote hypertensive microhemorrhage. Vascular: Normal flow voids. Skull and upper cervical spine: Normal marrow signal. Sinuses/Orbits: Negative.  Status post bilateral lens replacements. Other: Fluid throughout the  left mastoid air cells. IMPRESSION: Acute infarcts in the right posterior lentiform nucleus, thalamus, and tail of the caudate, with additional punctate infarct in the lateral left thalamus and possibly the left lentiform nucleus. Given multiple vascular territories, consider an embolic etiology. These results were called by telephone at the time of interpretation on 07/27/2021 at 6:02 PM to provider [facility] , who verbally acknowledged these results. Electronically Signed   By: Merilyn Baba M.D.   On: 07/27/2021 18:02   CT HEAD CODE STROKE WO CONTRAST  Result Date: 07/27/2021 CLINICAL DATA:  Code stroke. Left arm weakness starting at 9:30 this morning EXAM: CT HEAD WITHOUT CONTRAST TECHNIQUE: Contiguous axial images were obtained from the base of the skull through the vertex without intravenous contrast. COMPARISON:  CT head 10/27/2018, brain MRI 03/13/2021 FINDINGS: Brain: There is no evidence of acute intracranial hemorrhage, extra-axial fluid collection, or acute infarct. There is mild parenchymal volume loss. Confluent hypodensity throughout the subcortical and periventricular white matter likely reflects sequela of advanced chronic white matter microangiopathy. The ventricles are stable in size. There is no mass lesion.  There is no midline shift. Vascular: There is calcification of  the bilateral cavernous ICAs and vertebral arteries. There is no dense vessel. Skull: Normal. Negative for fracture or focal lesion. Sinuses/Orbits: The paranasal sinuses are clear. Bilateral lens implants are in place. The globes and orbits are otherwise unremarkable. Other: A left mastoid effusion is again seen. ASPECTS [facility] Stroke Program Early CT Score) - Ganglionic level infarction (caudate, lentiform nuclei, internal capsule, insula, M1-M3 cortex): 7 - Supraganglionic infarction (M4-M6 cortex): 3 Total score (0-10 with 10 being normal): 10 IMPRESSION: 1. No acute intracranial pathology. 2. ASPECTS is 10 3. Mild  parenchymal volume loss and advanced chronic white matter microangiopathy. These results were called by telephone at the time of interpretation on 07/27/2021 at 12:27 pm to provider [facility] , who verbally acknowledged these results. Electronically Signed   By: Valetta Mole M.D.   On: 07/27/2021 12:27   CT ANGIO HEAD NECK W WO CM (CODE STROKE)  Result Date: 07/27/2021 CLINICAL DATA:  Left-sided weakness EXAM: CT ANGIOGRAPHY HEAD AND NECK TECHNIQUE: Multidetector CT imaging of the head and neck was performed using the standard protocol during bolus administration of intravenous contrast. Multiplanar CT image reconstructions and MIPs were obtained to evaluate the vascular anatomy. Carotid stenosis measurements (when applicable) are obtained utilizing NASCET criteria, using the distal internal carotid diameter as the denominator. CONTRAST:  55mL OMNIPAQUE IOHEXOL 350 MG/ML SOLN COMPARISON:  Same-day noncontrast CT head, MRA head/neck 03/13/2021 FINDINGS: CTA NECK FINDINGS Aortic arch: There is calcified atherosclerotic plaque of the aortic arch and in the proximal great vessels. There is multifocal irregularity of the right subclavian artery without high-grade stenosis or occlusion. There is no evidence of dissection or aneurysm. Right carotid system: There is scattered calcified atherosclerotic plaque in the right common carotid artery without hemodynamically significant stenosis. There is soft and calcified atherosclerotic plaque of the proximal right internal carotid artery resulting in up to 30-40% stenosis. There is no evidence of dissection or aneurysm. Left carotid system: There is mild calcified atherosclerotic plaque in the left common carotid artery without hemodynamically significant stenosis. There is mild calcified atherosclerotic plaque in the proximal left internal carotid artery without hemodynamically significant stenosis or occlusion. There is no dissection or aneurysm. Vertebral arteries:  There is mild calcified atherosclerotic plaque in the proximal right vertebral artery and at the V2/V3 junction without hemodynamically significant stenosis or occlusion. The remainder of the right vertebral artery is patent. There is no dissection or aneurysm. The left vertebral artery is2 smaller than the right, a normal variant. There is no hemodynamically significant stenosis or occlusion. There is no dissection or aneurysm. Skeleton: There is multilevel degenerative change of the cervical spine, most advanced at C5-C6. Other neck: There is a subcentimeter left thyroid nodule. Small bilateral palatine tonsilliths are noted. There is a 1.0 cm enhancing lesion in the right parotid gland. Upper chest: There is emphysema in the lung apices. There is cavitation in the medial left upper lobe, incompletely imaged but also present on the prior CT chest from 12/21/2018. Review of the MIP images confirms the above findings CTA HEAD FINDINGS Anterior circulation: There is calcified atherosclerotic plaque of the bilateral intracranial carotid arteries resulting in mild to moderate stenosis on the right and mild stenosis on the left, similar to the prior study. There is no evidence of occlusion. The bilateral MCAs are patent. The bilateral ACAs are patent. There is no aneurysm. Posterior circulation: There is mild calcified atherosclerotic plaque of the right V4 segment without hemodynamically significant stenosis or occlusion. The left V4 segment is  patent. PICA is patent bilaterally. There is mild focal stenosis of the the proximal left P2 segment (8-117), similar to the prior study. Basilar artery is patent. The bilateral PCAs are patent. There is no aneurysm. Venous sinuses: Patent. Anatomic variants: None. Review of the MIP images confirms the above findings IMPRESSION: 1. Mild atherosclerotic plaque in the bilateral carotid bulbs resulting in up to 30-40% stenosis on the right. Patent vertebral arteries. 2. Calcified  atherosclerotic plaque of the bilateral intracranial ICAs resulting in mild to moderate stenosis on the right and mild stenosis on the left, not significantly changed. 3. Mild focal stenosis of the proximal left P2 segment. Otherwise, patent intracranial vasculature. 4. 1.0 cm enhancing lesion in the right parotid gland is indeterminate with differential including both benign and malignant etiologies. Recommend ENT referral. Aortic Atherosclerosis (ICD10-I70.0) and Emphysema (ICD10-J43.9). Electronically Signed   By: Valetta Mole M.D.   On: 07/27/2021 13:19    Procedures .Critical Care Performed by: Hayden Rasmussen, MD Authorized by: Hayden Rasmussen, MD   Critical care provider statement:    Critical care time (minutes):  45   Critical care time was exclusive of:  Separately billable procedures and treating other patients   Critical care was necessary to treat or prevent imminent or life-threatening deterioration of the following conditions:  CNS failure or compromise   Critical care was time spent personally by me on the following activities:  Development of treatment plan with patient or surrogate, discussions with consultants, evaluation of patient's response to treatment, examination of patient, obtaining history from patient or surrogate, ordering and performing treatments and interventions, ordering and review of laboratory studies, ordering and review of radiographic studies, pulse oximetry, re-evaluation of patient's condition and review of old charts   I assumed direction of critical care for this patient from another provider in my specialty: no     Medications Ordered in ED Medications  clopidogrel (PLAVIX) tablet 300 mg (300 mg Oral Given 07/27/21 1310)    And  clopidogrel (PLAVIX) tablet 75 mg (has no administration in time range)  heparin injection 5,000 Units (5,000 Units Subcutaneous Given 07/27/21 1631)  0.9 %  sodium chloride infusion ( Intravenous New Bag/Given 07/27/21  1627)  acetaminophen (TYLENOL) tablet 650 mg (650 mg Oral Given 07/27/21 1649)    Or  acetaminophen (TYLENOL) suppository 650 mg ( Rectal See Alternative 07/27/21 1649)  ondansetron (ZOFRAN) tablet 4 mg (has no administration in time range)    Or  ondansetron (ZOFRAN) injection 4 mg (has no administration in time range)  insulin aspart (novoLOG) injection 0-9 Units (1 Units Subcutaneous Given 07/27/21 1825)  insulin aspart (novoLOG) injection 0-5 Units (0 Units Subcutaneous Not Given 07/27/21 2116)  aspirin EC tablet 81 mg (81 mg Oral Given 07/27/21 1630)  loratadine (CLARITIN) tablet 10 mg (10 mg Oral Given 07/27/21 1630)  ezetimibe (ZETIA) tablet 10 mg (10 mg Oral Given 07/27/21 1630)  FLUoxetine (PROZAC) capsule 20 mg (20 mg Oral Given 07/27/21 1630)  HYDROcodone-acetaminophen (NORCO/VICODIN) 5-325 MG per tablet 1 tablet (has no administration in time range)  montelukast (SINGULAIR) tablet 10 mg (has no administration in time range)  simvastatin (ZOCOR) tablet 40 mg (has no administration in time range)  vitamin B-12 (CYANOCOBALAMIN) tablet 1,000 mcg (1,000 mcg Oral Given 07/27/21 1630)  labetalol (NORMODYNE) injection 10 mg (10 mg Intravenous Given 07/27/21 2024)  pantoprazole (PROTONIX) EC tablet 40 mg (40 mg Oral Given 07/27/21 1630)  ipratropium-albuterol (DUONEB) 0.5-2.5 (3) MG/3ML nebulizer solution 3 mL (has no  administration in time range)  clonazePAM (KLONOPIN) disintegrating tablet 0.25 mg (0.25 mg Oral Given 07/27/21 1630)  influenza vaccine adjuvanted (FLUAD) injection 0.5 mL (has no administration in time range)  pneumococcal 23 valent vaccine (PNEUMOVAX-23) injection 0.5 mL (has no administration in time range)  iohexol (OMNIPAQUE) 350 MG/ML injection 75 mL (75 mLs Intravenous Contrast Given 07/27/21 1257)    ED Course  I have reviewed the triage vital signs and the nursing notes.  Pertinent labs & imaging results that were available during my care of the patient were  reviewed by me and considered in my medical decision making (see chart for details).  Clinical Course as of 07/27/21 2126  Mon Jul 27, 2021  1240 Discussed with Dr. Leonel Ramsay neuro hospitalist.  He feels the patient probably had a small stroke may be yesterday.  tPA not indicated.  Getting CTA head and neck.  If no LVO can be admitted here for stroke work-up. [MB]  1325 Reviewed work-up with patient along with neurology recommendations.  Family member also present.  Patient is agreeable to admission to the hospital for further work-up.  Hospitalist has been paged. [MB]  1062 Discussed with Dr. Manuella Ghazi [facility] hospitalist who will evaluate the patient for admission. [MB]    Clinical Course User Index [MB] Hayden Rasmussen, MD   MDM Rules/Calculators/A&P                          This patient complains of generalized weakness, weakness of left arm and leg, fall; this involves an extensive number of treatment Options and is a complaint that carries with it a high risk of complications and Morbidity. The differential includes stroke, bleed, metabolic derangement, anemia, arrhythmia  I ordered, reviewed and interpreted labs, which included CBC with normal white count normal hemoglobin, chemistries normal other than elevated glucose BUN and creatinine, alcohol negative, urinalysis without signs of infection, COVID and flu negative, urine tox negative  I ordered imaging studies which included CT head MRI brain, CT angio head and neck and I independently    visualized and interpreted imaging which showed acute stroke Additional history obtained from patient's relative and EMS Previous records obtained and reviewed in epic I consulted Dr. Leonel Ramsay neuro hospitalist and Dr. Manuella Ghazi [facility] hospitalist and discussed lab and imaging findings  Critical Interventions: Evaluation and work-up the patient in the stroke window for possible tPA candidate.  Ultimately determined to be outside the window.  After  the interventions stated above, I reevaluated the patient and found patient still to be symptomatic.  He will need admission to the hospital for further work-up of stroke etiology.   Final Clinical Impression(s) / ED Diagnoses Final diagnoses:  Cerebrovascular accident (CVA), unspecified mechanism [facility])    Rx / DC Orders ED Discharge Orders     None        Hayden Rasmussen, MD 07/27/21 2131

## 2021-07-27 NOTE — ED Triage Notes (Signed)
Pt c/o weakness and vertigo for "awhile now". Pt reports his legs were weak bilaterally this morning and he fell this morning. After he fell he noticed his left arm was very weak. Denies pain. Pt reports the fall was 2-3 hours ago. Grip strength equal, but arm drift on the left side.

## 2021-07-28 ENCOUNTER — Encounter (HOSPITAL_COMMUNITY): Payer: Self-pay | Admitting: Internal Medicine

## 2021-07-28 ENCOUNTER — Inpatient Hospital Stay (HOSPITAL_COMMUNITY): Payer: Medicare Other

## 2021-07-28 DIAGNOSIS — I6389 Other cerebral infarction: Secondary | ICD-10-CM | POA: Diagnosis not present

## 2021-07-28 LAB — ECHOCARDIOGRAM COMPLETE
AR max vel: 1.16 cm2
AV Area VTI: 1.31 cm2
AV Area mean vel: 1.08 cm2
AV Mean grad: 14 mmHg
AV Peak grad: 27.9 mmHg
Ao pk vel: 2.64 m/s
Area-P 1/2: 1.72 cm2
Calc EF: 50.5 %
Height: 71 in
MV VTI: 2.03 cm2
S' Lateral: 2.8 cm
Single Plane A2C EF: 46.4 %
Single Plane A4C EF: 52.7 %
Weight: 2652.57 oz

## 2021-07-28 LAB — BASIC METABOLIC PANEL
Anion gap: 8 (ref 5–15)
BUN: 22 mg/dL (ref 8–23)
CO2: 28 mmol/L (ref 22–32)
Calcium: 9 mg/dL (ref 8.9–10.3)
Chloride: 103 mmol/L (ref 98–111)
Creatinine, Ser: 1.13 mg/dL (ref 0.61–1.24)
GFR, Estimated: >60 mL/min (ref >60)
Glucose, Bld: 148 mg/dL — ABNORMAL HIGH (ref 70–99)
Potassium: 3.2 mmol/L — ABNORMAL LOW (ref 3.5–5.1)
Sodium: 139 mmol/L (ref 135–145)

## 2021-07-28 LAB — GLUCOSE, CAPILLARY
Glucose-Capillary: 156 mg/dL — ABNORMAL HIGH (ref 70–99)
Glucose-Capillary: 199 mg/dL — ABNORMAL HIGH (ref 70–99)
Glucose-Capillary: 140 mg/dL — ABNORMAL HIGH (ref 70–99)
Glucose-Capillary: 164 mg/dL — ABNORMAL HIGH (ref 70–99)

## 2021-07-28 LAB — CBC
HCT: 45 % (ref 39.0–52.0)
Hemoglobin: 14.5 g/dL (ref 13.0–17.0)
MCH: 31.1 pg (ref 26.0–34.0)
MCHC: 32.2 g/dL (ref 30.0–36.0)
MCV: 96.6 fL (ref 80.0–100.0)
Platelets: 193 10*3/uL (ref 150–400)
RBC: 4.66 MIL/uL (ref 4.22–5.81)
RDW: 13.5 % (ref 11.5–15.5)
WBC: 7.1 10*3/uL (ref 4.0–10.5)
nRBC: 0 % (ref 0.0–0.2)

## 2021-07-28 LAB — MAGNESIUM: Magnesium: 1.9 mg/dL (ref 1.7–2.4)

## 2021-07-28 MED ORDER — TRAZODONE HCL 50 MG PO TABS
25.0000 mg | ORAL_TABLET | Freq: Every day | ORAL | Status: AC
  Administered 2021-07-28: 25 mg via ORAL
  Filled 2021-07-28: qty 1

## 2021-07-28 MED ORDER — TRAZODONE HCL 50 MG PO TABS
25.0000 mg | ORAL_TABLET | Freq: Once | ORAL | Status: AC
  Administered 2021-07-28: 25 mg via ORAL
  Filled 2021-07-28: qty 1

## 2021-07-28 MED ORDER — CALCIUM CARBONATE ANTACID 500 MG PO CHEW
1.0000 | CHEWABLE_TABLET | Freq: Once | ORAL | Status: AC
  Administered 2021-07-28: 200 mg via ORAL
  Filled 2021-07-28: qty 1

## 2021-07-28 MED ORDER — POTASSIUM CHLORIDE CRYS ER 20 MEQ PO TBCR
40.0000 meq | EXTENDED_RELEASE_TABLET | Freq: Once | ORAL | Status: AC
  Administered 2021-07-28: 40 meq via ORAL
  Filled 2021-07-28: qty 2

## 2021-07-28 NOTE — Progress Notes (Signed)
PROGRESS NOTE    Barry [name]  EHU:[id number] DOB: 25-Nov-1932 DOA: 07/27/2021 PCP: Susy Frizzle, MD   Brief Narrative:   Barry [name] is a 87 y.o. male with medical history significant for CAD, COPD, type 2 diabetes, hypertension, GERD, allergic rhinitis, and protein calorie malnutrition who presented to the ED with complaints of weakness and vertigo for quite some time now.  He cannot state an exact timeframe, but states that both of his legs were weak bilaterally this morning as he was standing on his porch.  Patient is noted to have bilateral CVA likely cardioembolic in origin.  TEE pending for a.m.  PT/OT evaluation pending with likely need for SNF.  Assessment & Plan:   Active Problems:   CVA (cerebral vascular accident) ([place])   Left-sided hemiparesis due to bilateral CVA -Likely cardioembolic in origin, TTE with no acute findings and LVEF 60-65% -Plan for TEE in a.m. with n.p.o. after midnight -With noted left-sided hemiparesis, ongoing -PT/OT evaluation pending with likely need for SNF due to ongoing weakness -Appreciate neurology evaluation -Continue Plavix and aspirin as ordered -Hemoglobin A1c 6.7% and LDL 84   Right-sided parotid mass -Incidental finding which will require ENT follow-up in outpatient setting   CKD stage IIIa -Baseline creatinine 1.1-1.3 -Continue to monitor   Hypertension -Hold antihypertensives and allow permissive hypertension -IV hydralazine as needed   CAD -Continue on Plavix as ordered -Continue statin   COPD with ongoing tobacco abuse -Counseled on cessation -Bronchodilators as needed   Type 2 diabetes -SSI -A1c 6.7%   GERD -PPI   DVT prophylaxis: Heparin Code Status: Full Family Communication: Son at bedside 10/11 Disposition Plan:  Status is: Inpatient  Remains inpatient appropriate because:Ongoing diagnostic testing needed not appropriate for outpatient work up and Inpatient level of care appropriate due  to severity of illness  Dispo: The patient is from: Home              Anticipated d/c is to: SNF              Patient currently is not medically stable to d/c.   Difficult to place patient No   Consultants:  Neurology  Procedures:  See below  Antimicrobials:  None   Subjective: Patient seen and evaluated today with no new acute complaints or concerns. No acute concerns or events noted overnight.  He continues to have left-sided weakness.  Objective: Vitals:   07/28/21 0800 07/28/21 1000 07/28/21 1042 07/28/21 1443  BP: (!) 208/83 (!) 197/79 (!) 177/85 (!) 172/79  Pulse: 61 (!) 56 60 65  Resp: 17 18  18   Temp: 97.6 F (36.4 C) 98 F (36.7 C)  98.4 F (36.9 C)  TempSrc:    Oral  SpO2: 99% 100%  98%  Weight:      Height:        Intake/Output Summary (Last 24 hours) at 07/28/2021 1546 Last data filed at 07/28/2021 1400 Gross per 24 hour  Intake 1121.97 ml  Output 2400 ml  Net -1278.03 ml   Filed Weights   07/27/21 1531  Weight: 75.2 kg    Examination:  General exam: Appears calm and comfortable  Respiratory system: Clear to auscultation. Respiratory effort normal. Cardiovascular system: S1 & S2 heard, RRR.  Gastrointestinal system: Abdomen is soft Central nervous system: Alert and awake Extremities: No edema Skin: No significant lesions noted Psychiatry: Flat affect.    Data Reviewed: I have personally reviewed following labs and imaging studies  CBC: Recent Labs  Lab 07/27/21 1231 07/27/21 1232 07/28/21 0548  WBC 9.2  --  7.1  NEUTROABS 6.9  --   --   HGB 15.8 16.0 14.5  HCT 46.4 47.0 45.0  MCV 96.7  --  96.6  PLT 210  --  834   Basic Metabolic Panel: Recent Labs  Lab 07/27/21 1231 07/27/21 1232 07/28/21 0548  NA 136 138 139  K 3.7 3.7 3.2*  CL 100 100 103  CO2 27  --  28  GLUCOSE 174* 173* 148*  BUN 24* 22 22  CREATININE 1.36* 1.30* 1.13  CALCIUM 9.4  --  9.0  MG  --   --  1.9   GFR: Estimated Creatinine Clearance: 50.8  mL/min (by C-G formula based on SCr of 1.13 mg/dL). Liver Function Tests: Recent Labs  Lab 07/27/21 1231  AST 25  ALT 17  ALKPHOS 49  BILITOT 0.5  PROT 7.9  ALBUMIN 4.4   No results for input(s): LIPASE, AMYLASE in the last 168 hours. No results for input(s): AMMONIA in the last 168 hours. Coagulation Profile: Recent Labs  Lab 07/27/21 1231  INR 1.0   Cardiac Enzymes: No results for input(s): CKTOTAL, CKMB, CKMBINDEX, TROPONINI in the last 168 hours. BNP (last 3 results) No results for input(s): PROBNP in the last 8760 hours. HbA1C: Recent Labs    07/27/21 1231  HGBA1C 6.7*   CBG: Recent Labs  Lab 07/27/21 1529 07/27/21 1812 07/27/21 2106 07/28/21 0731 07/28/21 1119  GLUCAP 142* 150* 194* 156* 199*   Lipid Profile: Recent Labs    07/27/21 1231  CHOL 164  HDL 64  LDLCALC 84  TRIG 81  CHOLHDL 2.6   Thyroid Function Tests: No results for input(s): TSH, T4TOTAL, FREET4, T3FREE, THYROIDAB in the last 72 hours. Anemia Panel: No results for input(s): VITAMINB12, FOLATE, FERRITIN, TIBC, IRON, RETICCTPCT in the last 72 hours. Sepsis Labs: No results for input(s): PROCALCITON, LATICACIDVEN in the last 168 hours.  Recent Results (from the past 240 hour(s))  Resp Panel by RT-PCR (Flu A&B, Covid) Nasopharyngeal Swab     Status: None   Collection Time: 07/27/21  2:04 PM   Specimen: Nasopharyngeal Swab; Nasopharyngeal(NP) swabs in vial transport medium  Result Value Ref Range Status   SARS Coronavirus 2 by RT PCR NEGATIVE NEGATIVE Final    Comment: (NOTE) SARS-CoV-2 target nucleic acids are NOT DETECTED.  The SARS-CoV-2 RNA is generally detectable in upper respiratory specimens during the acute phase of infection. The lowest concentration of SARS-CoV-2 viral copies this assay can detect is 138 copies/mL. A negative result does not preclude SARS-Cov-2 infection and should not be used as the sole basis for treatment or other patient management decisions. A  negative result may occur with  improper specimen collection/handling, submission of specimen other than nasopharyngeal swab, presence of viral mutation(s) within the areas targeted by this assay, and inadequate number of viral copies(<138 copies/mL). A negative result must be combined with clinical observations, patient history, and epidemiological information. The expected result is Negative.  Fact Sheet for Patients:  EntrepreneurPulse.com.au  Fact Sheet for Healthcare Providers:  IncredibleEmployment.be  This test is no t yet approved or cleared by the [place] FDA and  has been authorized for detection and/or diagnosis of SARS-CoV-2 by FDA under an Emergency Use Authorization (EUA). This EUA will remain  in effect (meaning this test can be used) for the duration of the COVID-19 declaration under Section 564(b)(1) of the Act, 21 U.S.C.section 360bbb-3(b)(1), unless the authorization is terminated  or revoked sooner.       Influenza A by PCR NEGATIVE NEGATIVE Final   Influenza B by PCR NEGATIVE NEGATIVE Final    Comment: (NOTE) The Xpert Xpress SARS-CoV-2/FLU/RSV plus assay is intended as an aid in the diagnosis of influenza from Nasopharyngeal swab specimens and should not be used as a sole basis for treatment. Nasal washings and aspirates are unacceptable for Xpert Xpress SARS-CoV-2/FLU/RSV testing.  Fact Sheet for Patients: EntrepreneurPulse.com.au  Fact Sheet for Healthcare Providers: IncredibleEmployment.be  This test is not yet approved or cleared by the [place] FDA and has been authorized for detection and/or diagnosis of SARS-CoV-2 by FDA under an Emergency Use Authorization (EUA). This EUA will remain in effect (meaning this test can be used) for the duration of the COVID-19 declaration under Section 564(b)(1) of the Act, 21 U.S.C. section 360bbb-3(b)(1), unless the authorization  is terminated or revoked.  Performed at [facility], [address], [place], [place] [address]          Radiology Studies: MR BRAIN WO CONTRAST  Result Date: 07/27/2021 CLINICAL DATA:  Neuro deficit, stroke suspected EXAM: MRI HEAD WITHOUT CONTRAST TECHNIQUE: Multiplanar, multiecho pulse sequences of the brain and surrounding structures were obtained without intravenous contrast. COMPARISON:  Same day CT head, MRI 12/17/2018. FINDINGS: Brain: Restricted diffusion in the right posterior lentiform nucleus, right thalamus, and tail of the caudate (series 5, image 18, with additional punctate infarct lateral left thalamus/medial left lentiform nucleus (series 5, image 18). Possible punctate focus in the left lentiform nucleus (series 5, image 17), with possible ADC correlate. Confluent T2 hyperintense signal in the periventricular white matter and pons, likely the sequela of severe chronic small vessel ischemic disease. Foci of susceptibility in the posterior left frontal lobe (series 9, images 52 and 47), likely sequela of remote hypertensive microhemorrhage. Vascular: Normal flow voids. Skull and upper cervical spine: Normal marrow signal. Sinuses/Orbits: Negative.  Status post bilateral lens replacements. Other: Fluid throughout the left mastoid air cells. IMPRESSION: Acute infarcts in the right posterior lentiform nucleus, thalamus, and tail of the caudate, with additional punctate infarct in the lateral left thalamus and possibly the left lentiform nucleus. Given multiple vascular territories, consider an embolic etiology. These results were called by telephone at the time of interpretation on 07/27/2021 at 6:02 PM to provider [facility] , who verbally acknowledged these results. Electronically Signed   By: Merilyn Baba M.D.   On: 07/27/2021 18:02   ECHOCARDIOGRAM COMPLETE  Result Date: 07/28/2021    ECHOCARDIOGRAM REPORT   Patient Name:   Barry [name] Date of Exam: 07/28/2021 Medical  Rec #:  [id number]         Height:       71.0 in Accession #:    [id number]        Weight:       165.8 lb Date of Birth:  [birth year]        BSA:          1.947 m Patient Age:    67 years          BP:           186/64 mmHg Patient Gender: M                 HR:           58 bpm. Exam Location:  [facility] Procedure: 2D Echo, Cardiac Doppler and Color Doppler Indications:    Stroke  History:  Patient has prior history of Echocardiogram examinations, most                 recent 04/09/2020. CAD, Stroke and COPD, Arrythmias:PVC,                 Signs/Symptoms:Chest Pain; Risk Factors:Hypertension, Diabetes,                 Dyslipidemia and Current Smoker. AAA.  Sonographer:    Wenda Low Referring Phys: [id number] Deloit D Kalihiwai  1. Left ventricular ejection fraction, by estimation, is 60 to 65%. The left ventricle has normal function. The left ventricle has no regional wall motion abnormalities. There is mild left ventricular hypertrophy. Left ventricular diastolic parameters are consistent with Grade I diastolic dysfunction (impaired relaxation).  2. Right ventricular systolic function is normal. The right ventricular size is normal.  3. The mitral valve is normal in structure. No evidence of mitral valve regurgitation. No evidence of mitral stenosis.  4. The aortic valve is normal in structure. There is moderate calcification of the aortic valve. There is moderate thickening of the aortic valve. Aortic valve regurgitation is not visualized. Mild to moderate aortic valve stenosis. Aortic valve area, by VTI measures 1.31 cm. Aortic valve mean gradient measures 14.0 mmHg. Aortic valve Vmax measures 2.64 m/s.  5. The inferior vena cava is normal in size with greater than 50% respiratory variability, suggesting right atrial pressure of 3 mmHg. Conclusion(s)/Recommendation(s): No intracardiac source of embolism detected on this transthoracic study. A transesophageal echocardiogram is recommended to  exclude cardiac source of embolism if clinically indicated. FINDINGS  Left Ventricle: Left ventricular ejection fraction, by estimation, is 60 to 65%. The left ventricle has normal function. The left ventricle has no regional wall motion abnormalities. The left ventricular internal cavity size was normal in size. There is  mild left ventricular hypertrophy. Left ventricular diastolic parameters are consistent with Grade I diastolic dysfunction (impaired relaxation). Right Ventricle: The right ventricular size is normal. No increase in right ventricular wall thickness. Right ventricular systolic function is normal. Left Atrium: Left atrial size was normal in size. Right Atrium: Right atrial size was normal in size. Pericardium: There is no evidence of pericardial effusion. Mitral Valve: The mitral valve is normal in structure. Mild mitral annular calcification. No evidence of mitral valve regurgitation. No evidence of mitral valve stenosis. MV peak gradient, 5.7 mmHg. The mean mitral valve gradient is 2.0 mmHg. Tricuspid Valve: The tricuspid valve is normal in structure. Tricuspid valve regurgitation is not demonstrated. No evidence of tricuspid stenosis. Aortic Valve: The aortic valve is normal in structure. There is moderate calcification of the aortic valve. There is moderate thickening of the aortic valve. Aortic valve regurgitation is not visualized. Mild to moderate aortic stenosis is present. Aortic valve mean gradient measures 14.0 mmHg. Aortic valve peak gradient measures 27.9 mmHg. Aortic valve area, by VTI measures 1.31 cm. Pulmonic Valve: The pulmonic valve was normal in structure. Pulmonic valve regurgitation is not visualized. No evidence of pulmonic stenosis. Aorta: The aortic root is normal in size and structure. Venous: The inferior vena cava is normal in size with greater than 50% respiratory variability, suggesting right atrial pressure of 3 mmHg. IAS/Shunts: No atrial level shunt detected by  color flow Doppler.  LEFT VENTRICLE PLAX 2D LVIDd:         4.00 cm      Diastology LVIDs:         2.80 cm      LV  e' medial:    6.85 cm/s LV PW:         1.40 cm      LV E/e' medial:  9.7 LV IVS:        1.30 cm      LV e' lateral:   7.94 cm/s LVOT diam:     2.00 cm      LV E/e' lateral: 8.4 LV SV:         79 LV SV Index:   41 LVOT Area:     3.14 cm  LV Volumes (MOD) LV vol d, MOD A2C: 99.3 ml LV vol d, MOD A4C: 121.0 ml LV vol s, MOD A2C: 53.2 ml LV vol s, MOD A4C: 57.2 ml LV SV MOD A2C:     46.1 ml LV SV MOD A4C:     121.0 ml LV SV MOD BP:      56.8 ml RIGHT VENTRICLE RV Basal diam:  3.50 cm RV Mid diam:    3.00 cm RV S prime:     14.70 cm/s TAPSE (M-mode): 2.6 cm LEFT ATRIUM             Index        RIGHT ATRIUM           Index LA diam:        3.30 cm 1.69 cm/m   RA Area:     19.50 cm LA Vol (A2C):   70.3 ml 36.11 ml/m  RA Volume:   52.90 ml  27.17 ml/m LA Vol (A4C):   46.2 ml 23.73 ml/m LA Biplane Vol: 58.8 ml 30.20 ml/m  AORTIC VALVE                     PULMONIC VALVE AV Area (Vmax):    1.16 cm      PV Vmax:       1.05 m/s AV Area (Vmean):   1.08 cm      PV Peak grad:  4.4 mmHg AV Area (VTI):     1.31 cm AV Vmax:           264.25 cm/s AV Vmean:          175.250 cm/s AV VTI:            0.608 m AV Peak Grad:      27.9 mmHg AV Mean Grad:      14.0 mmHg LVOT Vmax:         97.90 cm/s LVOT Vmean:        60.100 cm/s LVOT VTI:          0.253 m LVOT/AV VTI ratio: 0.42  AORTA Ao Root diam: 3.00 cm MITRAL VALVE MV Area (PHT): 1.72 cm     SHUNTS MV Area VTI:   2.03 cm     Systemic VTI:  0.25 m MV Peak grad:  5.7 mmHg     Systemic Diam: 2.00 cm MV Mean grad:  2.0 mmHg MV Vmax:       1.19 m/s MV Vmean:      57.0 cm/s MV Decel Time: 441 msec MV E velocity: 66.40 cm/s MV A velocity: 108.00 cm/s MV E/A ratio:  0.61 Candee Furbish MD Electronically signed by Candee Furbish MD Signature Date/Time: 07/28/2021/2:58:35 PM    Final    CT HEAD CODE STROKE WO CONTRAST  Result Date: 07/27/2021 CLINICAL DATA:  Code stroke. Left  arm weakness starting at 9:30 this morning EXAM: CT HEAD WITHOUT CONTRAST TECHNIQUE:  Contiguous axial images were obtained from the base of the skull through the vertex without intravenous contrast. COMPARISON:  CT head 10/27/2018, brain MRI 03/13/2021 FINDINGS: Brain: There is no evidence of acute intracranial hemorrhage, extra-axial fluid collection, or acute infarct. There is mild parenchymal volume loss. Confluent hypodensity throughout the subcortical and periventricular white matter likely reflects sequela of advanced chronic white matter microangiopathy. The ventricles are stable in size. There is no mass lesion.  There is no midline shift. Vascular: There is calcification of the bilateral cavernous ICAs and vertebral arteries. There is no dense vessel. Skull: Normal. Negative for fracture or focal lesion. Sinuses/Orbits: The paranasal sinuses are clear. Bilateral lens implants are in place. The globes and orbits are otherwise unremarkable. Other: A left mastoid effusion is again seen. ASPECTS [facility] Stroke Program Early CT Score) - Ganglionic level infarction (caudate, lentiform nuclei, internal capsule, insula, M1-M3 cortex): 7 - Supraganglionic infarction (M4-M6 cortex): 3 Total score (0-10 with 10 being normal): 10 IMPRESSION: 1. No acute intracranial pathology. 2. ASPECTS is 10 3. Mild parenchymal volume loss and advanced chronic white matter microangiopathy. These results were called by telephone at the time of interpretation on 07/27/2021 at 12:27 pm to provider [facility] , who verbally acknowledged these results. Electronically Signed   By: Valetta Mole M.D.   On: 07/27/2021 12:27   CT ANGIO HEAD NECK W WO CM (CODE STROKE)  Result Date: 07/27/2021 CLINICAL DATA:  Left-sided weakness EXAM: CT ANGIOGRAPHY HEAD AND NECK TECHNIQUE: Multidetector CT imaging of the head and neck was performed using the standard protocol during bolus administration of intravenous contrast. Multiplanar CT image  reconstructions and MIPs were obtained to evaluate the vascular anatomy. Carotid stenosis measurements (when applicable) are obtained utilizing NASCET criteria, using the distal internal carotid diameter as the denominator. CONTRAST:  58mL OMNIPAQUE IOHEXOL 350 MG/ML SOLN COMPARISON:  Same-day noncontrast CT head, MRA head/neck 03/13/2021 FINDINGS: CTA NECK FINDINGS Aortic arch: There is calcified atherosclerotic plaque of the aortic arch and in the proximal great vessels. There is multifocal irregularity of the right subclavian artery without high-grade stenosis or occlusion. There is no evidence of dissection or aneurysm. Right carotid system: There is scattered calcified atherosclerotic plaque in the right common carotid artery without hemodynamically significant stenosis. There is soft and calcified atherosclerotic plaque of the proximal right internal carotid artery resulting in up to 30-40% stenosis. There is no evidence of dissection or aneurysm. Left carotid system: There is mild calcified atherosclerotic plaque in the left common carotid artery without hemodynamically significant stenosis. There is mild calcified atherosclerotic plaque in the proximal left internal carotid artery without hemodynamically significant stenosis or occlusion. There is no dissection or aneurysm. Vertebral arteries: There is mild calcified atherosclerotic plaque in the proximal right vertebral artery and at the V2/V3 junction without hemodynamically significant stenosis or occlusion. The remainder of the right vertebral artery is patent. There is no dissection or aneurysm. The left vertebral artery is2 smaller than the right, a normal variant. There is no hemodynamically significant stenosis or occlusion. There is no dissection or aneurysm. Skeleton: There is multilevel degenerative change of the cervical spine, most advanced at C5-C6. Other neck: There is a subcentimeter left thyroid nodule. Small bilateral palatine tonsilliths  are noted. There is a 1.0 cm enhancing lesion in the right parotid gland. Upper chest: There is emphysema in the lung apices. There is cavitation in the medial left upper lobe, incompletely imaged but also present on the prior CT chest from 12/21/2018. Review of  the MIP images confirms the above findings CTA HEAD FINDINGS Anterior circulation: There is calcified atherosclerotic plaque of the bilateral intracranial carotid arteries resulting in mild to moderate stenosis on the right and mild stenosis on the left, similar to the prior study. There is no evidence of occlusion. The bilateral MCAs are patent. The bilateral ACAs are patent. There is no aneurysm. Posterior circulation: There is mild calcified atherosclerotic plaque of the right V4 segment without hemodynamically significant stenosis or occlusion. The left V4 segment is patent. PICA is patent bilaterally. There is mild focal stenosis of the the proximal left P2 segment (8-117), similar to the prior study. Basilar artery is patent. The bilateral PCAs are patent. There is no aneurysm. Venous sinuses: Patent. Anatomic variants: None. Review of the MIP images confirms the above findings IMPRESSION: 1. Mild atherosclerotic plaque in the bilateral carotid bulbs resulting in up to 30-40% stenosis on the right. Patent vertebral arteries. 2. Calcified atherosclerotic plaque of the bilateral intracranial ICAs resulting in mild to moderate stenosis on the right and mild stenosis on the left, not significantly changed. 3. Mild focal stenosis of the proximal left P2 segment. Otherwise, patent intracranial vasculature. 4. 1.0 cm enhancing lesion in the right parotid gland is indeterminate with differential including both benign and malignant etiologies. Recommend ENT referral. Aortic Atherosclerosis (ICD10-I70.0) and Emphysema (ICD10-J43.9). Electronically Signed   By: Valetta Mole M.D.   On: 07/27/2021 13:19        Scheduled Meds:  aspirin EC  81 mg Oral q  morning   clonazePAM  0.25 mg Oral TID AC & HS   clopidogrel  75 mg Oral Daily   ezetimibe  10 mg Oral Daily   FLUoxetine  20 mg Oral Daily   heparin  5,000 Units Subcutaneous Q8H   influenza vaccine adjuvanted  0.5 mL Intramuscular Tomorrow-1000   insulin aspart  0-5 Units Subcutaneous QHS   insulin aspart  0-9 Units Subcutaneous TID WC   loratadine  10 mg Oral Daily   montelukast  10 mg Oral QHS   pantoprazole  40 mg Oral Daily   pneumococcal 23 valent vaccine  0.5 mL Intramuscular Tomorrow-1000   simvastatin  40 mg Oral QHS   vitamin B-12  1,000 mcg Oral Daily     LOS: 1 day    Time spent: 35 minutes    Tariah Transue Darleen Crocker, DO [facility]  If 7PM-7AM, please contact night-coverage [email or web address] 07/28/2021, 3:46 PM

## 2021-07-28 NOTE — H&P (View-Only) (Signed)
PROGRESS NOTE    Barry [name]  ZOX:[id number] DOB: Dec 13, 1932 DOA: 07/27/2021 PCP: Susy Frizzle, MD   Brief Narrative:   Barry [name] is a 87 y.o. male with medical history significant for CAD, COPD, type 2 diabetes, hypertension, GERD, allergic rhinitis, and protein calorie malnutrition who presented to the ED with complaints of weakness and vertigo for quite some time now.  He cannot state an exact timeframe, but states that both of his legs were weak bilaterally this morning as he was standing on his porch.  Patient is noted to have bilateral CVA likely cardioembolic in origin.  TEE pending for a.m.  PT/OT evaluation pending with likely need for SNF.  Assessment & Plan:   Active Problems:   CVA (cerebral vascular accident) ([place])   Left-sided hemiparesis due to bilateral CVA -Likely cardioembolic in origin, TTE with no acute findings and LVEF 60-65% -Plan for TEE in a.m. with n.p.o. after midnight -With noted left-sided hemiparesis, ongoing -PT/OT evaluation pending with likely need for SNF due to ongoing weakness -Appreciate neurology evaluation -Continue Plavix and aspirin as ordered -Hemoglobin A1c 6.7% and LDL 84   Right-sided parotid mass -Incidental finding which will require ENT follow-up in outpatient setting   CKD stage IIIa -Baseline creatinine 1.1-1.3 -Continue to monitor   Hypertension -Hold antihypertensives and allow permissive hypertension -IV hydralazine as needed   CAD -Continue on Plavix as ordered -Continue statin   COPD with ongoing tobacco abuse -Counseled on cessation -Bronchodilators as needed   Type 2 diabetes -SSI -A1c 6.7%   GERD -PPI   DVT prophylaxis: Heparin Code Status: Full Family Communication: Son at bedside 10/11 Disposition Plan:  Status is: Inpatient  Remains inpatient appropriate because:Ongoing diagnostic testing needed not appropriate for outpatient work up and Inpatient level of care appropriate due  to severity of illness  Dispo: The patient is from: Home              Anticipated d/c is to: SNF              Patient currently is not medically stable to d/c.   Difficult to place patient No   Consultants:  Neurology  Procedures:  See below  Antimicrobials:  None   Subjective: Patient seen and evaluated today with no new acute complaints or concerns. No acute concerns or events noted overnight.  He continues to have left-sided weakness.  Objective: Vitals:   07/28/21 0800 07/28/21 1000 07/28/21 1042 07/28/21 1443  BP: (!) 208/83 (!) 197/79 (!) 177/85 (!) 172/79  Pulse: 61 (!) 56 60 65  Resp: 17 18  18   Temp: 97.6 F (36.4 C) 98 F (36.7 C)  98.4 F (36.9 C)  TempSrc:    Oral  SpO2: 99% 100%  98%  Weight:      Height:        Intake/Output Summary (Last 24 hours) at 07/28/2021 1546 Last data filed at 07/28/2021 1400 Gross per 24 hour  Intake 1121.97 ml  Output 2400 ml  Net -1278.03 ml   Filed Weights   07/27/21 1531  Weight: 75.2 kg    Examination:  General exam: Appears calm and comfortable  Respiratory system: Clear to auscultation. Respiratory effort normal. Cardiovascular system: S1 & S2 heard, RRR.  Gastrointestinal system: Abdomen is soft Central nervous system: Alert and awake Extremities: No edema Skin: No significant lesions noted Psychiatry: Flat affect.    Data Reviewed: I have personally reviewed following labs and imaging studies  CBC: Recent Labs  Lab 07/27/21 1231 07/27/21 1232 07/28/21 0548  WBC 9.2  --  7.1  NEUTROABS 6.9  --   --   HGB 15.8 16.0 14.5  HCT 46.4 47.0 45.0  MCV 96.7  --  96.6  PLT 210  --  650   Basic Metabolic Panel: Recent Labs  Lab 07/27/21 1231 07/27/21 1232 07/28/21 0548  NA 136 138 139  K 3.7 3.7 3.2*  CL 100 100 103  CO2 27  --  28  GLUCOSE 174* 173* 148*  BUN 24* 22 22  CREATININE 1.36* 1.30* 1.13  CALCIUM 9.4  --  9.0  MG  --   --  1.9   GFR: Estimated Creatinine Clearance: 50.8  mL/min (by C-G formula based on SCr of 1.13 mg/dL). Liver Function Tests: Recent Labs  Lab 07/27/21 1231  AST 25  ALT 17  ALKPHOS 49  BILITOT 0.5  PROT 7.9  ALBUMIN 4.4   No results for input(s): LIPASE, AMYLASE in the last 168 hours. No results for input(s): AMMONIA in the last 168 hours. Coagulation Profile: Recent Labs  Lab 07/27/21 1231  INR 1.0   Cardiac Enzymes: No results for input(s): CKTOTAL, CKMB, CKMBINDEX, TROPONINI in the last 168 hours. BNP (last 3 results) No results for input(s): PROBNP in the last 8760 hours. HbA1C: Recent Labs    07/27/21 1231  HGBA1C 6.7*   CBG: Recent Labs  Lab 07/27/21 1529 07/27/21 1812 07/27/21 2106 07/28/21 0731 07/28/21 1119  GLUCAP 142* 150* 194* 156* 199*   Lipid Profile: Recent Labs    07/27/21 1231  CHOL 164  HDL 64  LDLCALC 84  TRIG 81  CHOLHDL 2.6   Thyroid Function Tests: No results for input(s): TSH, T4TOTAL, FREET4, T3FREE, THYROIDAB in the last 72 hours. Anemia Panel: No results for input(s): VITAMINB12, FOLATE, FERRITIN, TIBC, IRON, RETICCTPCT in the last 72 hours. Sepsis Labs: No results for input(s): PROCALCITON, LATICACIDVEN in the last 168 hours.  Recent Results (from the past 240 hour(s))  Resp Panel by RT-PCR (Flu A&B, Covid) Nasopharyngeal Swab     Status: None   Collection Time: 07/27/21  2:04 PM   Specimen: Nasopharyngeal Swab; Nasopharyngeal(NP) swabs in vial transport medium  Result Value Ref Range Status   SARS Coronavirus 2 by RT PCR NEGATIVE NEGATIVE Final    Comment: (NOTE) SARS-CoV-2 target nucleic acids are NOT DETECTED.  The SARS-CoV-2 RNA is generally detectable in upper respiratory specimens during the acute phase of infection. The lowest concentration of SARS-CoV-2 viral copies this assay can detect is 138 copies/mL. A negative result does not preclude SARS-Cov-2 infection and should not be used as the sole basis for treatment or other patient management decisions. A  negative result may occur with  improper specimen collection/handling, submission of specimen other than nasopharyngeal swab, presence of viral mutation(s) within the areas targeted by this assay, and inadequate number of viral copies(<138 copies/mL). A negative result must be combined with clinical observations, patient history, and epidemiological information. The expected result is Negative.  Fact Sheet for Patients:  EntrepreneurPulse.com.au  Fact Sheet for Healthcare Providers:  IncredibleEmployment.be  This test is no t yet approved or cleared by the [place] FDA and  has been authorized for detection and/or diagnosis of SARS-CoV-2 by FDA under an Emergency Use Authorization (EUA). This EUA will remain  in effect (meaning this test can be used) for the duration of the COVID-19 declaration under Section 564(b)(1) of the Act, 21 U.S.C.section 360bbb-3(b)(1), unless the authorization is terminated  or revoked sooner.       Influenza A by PCR NEGATIVE NEGATIVE Final   Influenza B by PCR NEGATIVE NEGATIVE Final    Comment: (NOTE) The Xpert Xpress SARS-CoV-2/FLU/RSV plus assay is intended as an aid in the diagnosis of influenza from Nasopharyngeal swab specimens and should not be used as a sole basis for treatment. Nasal washings and aspirates are unacceptable for Xpert Xpress SARS-CoV-2/FLU/RSV testing.  Fact Sheet for Patients: EntrepreneurPulse.com.au  Fact Sheet for Healthcare Providers: IncredibleEmployment.be  This test is not yet approved or cleared by the [place] FDA and has been authorized for detection and/or diagnosis of SARS-CoV-2 by FDA under an Emergency Use Authorization (EUA). This EUA will remain in effect (meaning this test can be used) for the duration of the COVID-19 declaration under Section 564(b)(1) of the Act, 21 U.S.C. section 360bbb-3(b)(1), unless the authorization  is terminated or revoked.  Performed at [facility], [address], [place], [place] [address]          Radiology Studies: MR BRAIN WO CONTRAST  Result Date: 07/27/2021 CLINICAL DATA:  Neuro deficit, stroke suspected EXAM: MRI HEAD WITHOUT CONTRAST TECHNIQUE: Multiplanar, multiecho pulse sequences of the brain and surrounding structures were obtained without intravenous contrast. COMPARISON:  Same day CT head, MRI 12/17/2018. FINDINGS: Brain: Restricted diffusion in the right posterior lentiform nucleus, right thalamus, and tail of the caudate (series 5, image 18, with additional punctate infarct lateral left thalamus/medial left lentiform nucleus (series 5, image 18). Possible punctate focus in the left lentiform nucleus (series 5, image 17), with possible ADC correlate. Confluent T2 hyperintense signal in the periventricular white matter and pons, likely the sequela of severe chronic small vessel ischemic disease. Foci of susceptibility in the posterior left frontal lobe (series 9, images 52 and 47), likely sequela of remote hypertensive microhemorrhage. Vascular: Normal flow voids. Skull and upper cervical spine: Normal marrow signal. Sinuses/Orbits: Negative.  Status post bilateral lens replacements. Other: Fluid throughout the left mastoid air cells. IMPRESSION: Acute infarcts in the right posterior lentiform nucleus, thalamus, and tail of the caudate, with additional punctate infarct in the lateral left thalamus and possibly the left lentiform nucleus. Given multiple vascular territories, consider an embolic etiology. These results were called by telephone at the time of interpretation on 07/27/2021 at 6:02 PM to provider [facility] , who verbally acknowledged these results. Electronically Signed   By: Merilyn Baba M.D.   On: 07/27/2021 18:02   ECHOCARDIOGRAM COMPLETE  Result Date: 07/28/2021    ECHOCARDIOGRAM REPORT   Patient Name:   Barry [name] Date of Exam: 07/28/2021 Medical  Rec #:  [id number]         Height:       71.0 in Accession #:    [id number]        Weight:       165.8 lb Date of Birth:  Oct 07, 1933        BSA:          1.947 m Patient Age:    78 years          BP:           186/64 mmHg Patient Gender: M                 HR:           58 bpm. Exam Location:  [facility] Procedure: 2D Echo, Cardiac Doppler and Color Doppler Indications:    Stroke  History:  Patient has prior history of Echocardiogram examinations, most                 recent 04/09/2020. CAD, Stroke and COPD, Arrythmias:PVC,                 Signs/Symptoms:Chest Pain; Risk Factors:Hypertension, Diabetes,                 Dyslipidemia and Current Smoker. AAA.  Sonographer:    Wenda Low Referring Phys: [id number] [facility] [place] [facility] [place]  1. Left ventricular ejection fraction, by estimation, is 60 to 65%. The left ventricle has normal function. The left ventricle has no regional wall motion abnormalities. There is mild left ventricular hypertrophy. Left ventricular diastolic parameters are consistent with Grade I diastolic dysfunction (impaired relaxation).  2. Right ventricular systolic function is normal. The right ventricular size is normal.  3. The mitral valve is normal in structure. No evidence of mitral valve regurgitation. No evidence of mitral stenosis.  4. The aortic valve is normal in structure. There is moderate calcification of the aortic valve. There is moderate thickening of the aortic valve. Aortic valve regurgitation is not visualized. Mild to moderate aortic valve stenosis. Aortic valve area, by VTI measures 1.31 cm. Aortic valve mean gradient measures 14.0 mmHg. Aortic valve Vmax measures 2.64 m/s.  5. The inferior vena cava is normal in size with greater than 50% respiratory variability, suggesting right atrial pressure of 3 mmHg. Conclusion(s)/Recommendation(s): No intracardiac source of embolism detected on this transthoracic study. A transesophageal echocardiogram is recommended to  exclude cardiac source of embolism if clinically indicated. FINDINGS  Left Ventricle: Left ventricular ejection fraction, by estimation, is 60 to 65%. The left ventricle has normal function. The left ventricle has no regional wall motion abnormalities. The left ventricular internal cavity size was normal in size. There is  mild left ventricular hypertrophy. Left ventricular diastolic parameters are consistent with Grade I diastolic dysfunction (impaired relaxation). Right Ventricle: The right ventricular size is normal. No increase in right ventricular wall thickness. Right ventricular systolic function is normal. Left Atrium: Left atrial size was normal in size. Right Atrium: Right atrial size was normal in size. Pericardium: There is no evidence of pericardial effusion. Mitral Valve: The mitral valve is normal in structure. Mild mitral annular calcification. No evidence of mitral valve regurgitation. No evidence of mitral valve stenosis. MV peak gradient, 5.7 mmHg. The mean mitral valve gradient is 2.0 mmHg. Tricuspid Valve: The tricuspid valve is normal in structure. Tricuspid valve regurgitation is not demonstrated. No evidence of tricuspid stenosis. Aortic Valve: The aortic valve is normal in structure. There is moderate calcification of the aortic valve. There is moderate thickening of the aortic valve. Aortic valve regurgitation is not visualized. Mild to moderate aortic stenosis is present. Aortic valve mean gradient measures 14.0 mmHg. Aortic valve peak gradient measures 27.9 mmHg. Aortic valve area, by VTI measures 1.31 cm. Pulmonic Valve: The pulmonic valve was normal in structure. Pulmonic valve regurgitation is not visualized. No evidence of pulmonic stenosis. Aorta: The aortic root is normal in size and structure. Venous: The inferior vena cava is normal in size with greater than 50% respiratory variability, suggesting right atrial pressure of 3 mmHg. IAS/Shunts: No atrial level shunt detected by  color flow Doppler.  LEFT VENTRICLE PLAX 2D LVIDd:         4.00 cm      Diastology LVIDs:         2.80 cm      LV  e' medial:    6.85 cm/s LV PW:         1.40 cm      LV E/e' medial:  9.7 LV IVS:        1.30 cm      LV e' lateral:   7.94 cm/s LVOT diam:     2.00 cm      LV E/e' lateral: 8.4 LV SV:         79 LV SV Index:   41 LVOT Area:     3.14 cm  LV Volumes (MOD) LV vol d, MOD A2C: 99.3 ml LV vol d, MOD A4C: 121.0 ml LV vol s, MOD A2C: 53.2 ml LV vol s, MOD A4C: 57.2 ml LV SV MOD A2C:     46.1 ml LV SV MOD A4C:     121.0 ml LV SV MOD BP:      56.8 ml RIGHT VENTRICLE RV Basal diam:  3.50 cm RV Mid diam:    3.00 cm RV S prime:     14.70 cm/s TAPSE (M-mode): 2.6 cm LEFT ATRIUM             Index        RIGHT ATRIUM           Index LA diam:        3.30 cm 1.69 cm/m   RA Area:     19.50 cm LA Vol (A2C):   70.3 ml 36.11 ml/m  RA Volume:   52.90 ml  27.17 ml/m LA Vol (A4C):   46.2 ml 23.73 ml/m LA Biplane Vol: 58.8 ml 30.20 ml/m  AORTIC VALVE                     PULMONIC VALVE AV Area (Vmax):    1.16 cm      PV Vmax:       1.05 m/s AV Area (Vmean):   1.08 cm      PV Peak grad:  4.4 mmHg AV Area (VTI):     1.31 cm AV Vmax:           264.25 cm/s AV Vmean:          175.250 cm/s AV VTI:            0.608 m AV Peak Grad:      27.9 mmHg AV Mean Grad:      14.0 mmHg LVOT Vmax:         97.90 cm/s LVOT Vmean:        60.100 cm/s LVOT VTI:          0.253 m LVOT/AV VTI ratio: 0.42  AORTA Ao Root diam: 3.00 cm MITRAL VALVE MV Area (PHT): 1.72 cm     SHUNTS MV Area VTI:   2.03 cm     Systemic VTI:  0.25 m MV Peak grad:  5.7 mmHg     Systemic Diam: 2.00 cm MV Mean grad:  2.0 mmHg MV Vmax:       1.19 m/s MV Vmean:      57.0 cm/s MV Decel Time: 441 msec MV E velocity: 66.40 cm/s MV A velocity: 108.00 cm/s MV E/A ratio:  0.61 Candee Furbish MD Electronically signed by Candee Furbish MD Signature Date/Time: 07/28/2021/2:58:35 PM    Final    CT HEAD CODE STROKE WO CONTRAST  Result Date: 07/27/2021 CLINICAL DATA:  Code stroke. Left  arm weakness starting at 9:30 this morning EXAM: CT HEAD WITHOUT CONTRAST TECHNIQUE:  Contiguous axial images were obtained from the base of the skull through the vertex without intravenous contrast. COMPARISON:  CT head 10/27/2018, brain MRI 03/13/2021 FINDINGS: Brain: There is no evidence of acute intracranial hemorrhage, extra-axial fluid collection, or acute infarct. There is mild parenchymal volume loss. Confluent hypodensity throughout the subcortical and periventricular white matter likely reflects sequela of advanced chronic white matter microangiopathy. The ventricles are stable in size. There is no mass lesion.  There is no midline shift. Vascular: There is calcification of the bilateral cavernous ICAs and vertebral arteries. There is no dense vessel. Skull: Normal. Negative for fracture or focal lesion. Sinuses/Orbits: The paranasal sinuses are clear. Bilateral lens implants are in place. The globes and orbits are otherwise unremarkable. Other: A left mastoid effusion is again seen. ASPECTS [facility] Stroke Program Early CT Score) - Ganglionic level infarction (caudate, lentiform nuclei, internal capsule, insula, M1-M3 cortex): 7 - Supraganglionic infarction (M4-M6 cortex): 3 Total score (0-10 with 10 being normal): 10 IMPRESSION: 1. No acute intracranial pathology. 2. ASPECTS is 10 3. Mild parenchymal volume loss and advanced chronic white matter microangiopathy. These results were called by telephone at the time of interpretation on 07/27/2021 at 12:27 pm to provider [facility] , who verbally acknowledged these results. Electronically Signed   By: Valetta Mole M.D.   On: 07/27/2021 12:27   CT ANGIO HEAD NECK W WO CM (CODE STROKE)  Result Date: 07/27/2021 CLINICAL DATA:  Left-sided weakness EXAM: CT ANGIOGRAPHY HEAD AND NECK TECHNIQUE: Multidetector CT imaging of the head and neck was performed using the standard protocol during bolus administration of intravenous contrast. Multiplanar CT image  reconstructions and MIPs were obtained to evaluate the vascular anatomy. Carotid stenosis measurements (when applicable) are obtained utilizing NASCET criteria, using the distal internal carotid diameter as the denominator. CONTRAST:  67mL OMNIPAQUE IOHEXOL 350 MG/ML SOLN COMPARISON:  Same-day noncontrast CT head, MRA head/neck 03/13/2021 FINDINGS: CTA NECK FINDINGS Aortic arch: There is calcified atherosclerotic plaque of the aortic arch and in the proximal great vessels. There is multifocal irregularity of the right subclavian artery without high-grade stenosis or occlusion. There is no evidence of dissection or aneurysm. Right carotid system: There is scattered calcified atherosclerotic plaque in the right common carotid artery without hemodynamically significant stenosis. There is soft and calcified atherosclerotic plaque of the proximal right internal carotid artery resulting in up to 30-40% stenosis. There is no evidence of dissection or aneurysm. Left carotid system: There is mild calcified atherosclerotic plaque in the left common carotid artery without hemodynamically significant stenosis. There is mild calcified atherosclerotic plaque in the proximal left internal carotid artery without hemodynamically significant stenosis or occlusion. There is no dissection or aneurysm. Vertebral arteries: There is mild calcified atherosclerotic plaque in the proximal right vertebral artery and at the V2/V3 junction without hemodynamically significant stenosis or occlusion. The remainder of the right vertebral artery is patent. There is no dissection or aneurysm. The left vertebral artery is2 smaller than the right, a normal variant. There is no hemodynamically significant stenosis or occlusion. There is no dissection or aneurysm. Skeleton: There is multilevel degenerative change of the cervical spine, most advanced at C5-C6. Other neck: There is a subcentimeter left thyroid nodule. Small bilateral palatine tonsilliths  are noted. There is a 1.0 cm enhancing lesion in the right parotid gland. Upper chest: There is emphysema in the lung apices. There is cavitation in the medial left upper lobe, incompletely imaged but also present on the prior CT chest from 12/21/2018. Review of  the MIP images confirms the above findings CTA HEAD FINDINGS Anterior circulation: There is calcified atherosclerotic plaque of the bilateral intracranial carotid arteries resulting in mild to moderate stenosis on the right and mild stenosis on the left, similar to the prior study. There is no evidence of occlusion. The bilateral MCAs are patent. The bilateral ACAs are patent. There is no aneurysm. Posterior circulation: There is mild calcified atherosclerotic plaque of the right V4 segment without hemodynamically significant stenosis or occlusion. The left V4 segment is patent. PICA is patent bilaterally. There is mild focal stenosis of the the proximal left P2 segment (8-117), similar to the prior study. Basilar artery is patent. The bilateral PCAs are patent. There is no aneurysm. Venous sinuses: Patent. Anatomic variants: None. Review of the MIP images confirms the above findings IMPRESSION: 1. Mild atherosclerotic plaque in the bilateral carotid bulbs resulting in up to 30-40% stenosis on the right. Patent vertebral arteries. 2. Calcified atherosclerotic plaque of the bilateral intracranial ICAs resulting in mild to moderate stenosis on the right and mild stenosis on the left, not significantly changed. 3. Mild focal stenosis of the proximal left P2 segment. Otherwise, patent intracranial vasculature. 4. 1.0 cm enhancing lesion in the right parotid gland is indeterminate with differential including both benign and malignant etiologies. Recommend ENT referral. Aortic Atherosclerosis (ICD10-I70.0) and Emphysema (ICD10-J43.9). Electronically Signed   By: Valetta Mole M.D.   On: 07/27/2021 13:19        Scheduled Meds:  aspirin EC  81 mg Oral q  morning   clonazePAM  0.25 mg Oral TID AC & HS   clopidogrel  75 mg Oral Daily   ezetimibe  10 mg Oral Daily   FLUoxetine  20 mg Oral Daily   heparin  5,000 Units Subcutaneous Q8H   influenza vaccine adjuvanted  0.5 mL Intramuscular Tomorrow-1000   insulin aspart  0-5 Units Subcutaneous QHS   insulin aspart  0-9 Units Subcutaneous TID WC   loratadine  10 mg Oral Daily   montelukast  10 mg Oral QHS   pantoprazole  40 mg Oral Daily   pneumococcal 23 valent vaccine  0.5 mL Intramuscular Tomorrow-1000   simvastatin  40 mg Oral QHS   vitamin B-12  1,000 mcg Oral Daily     LOS: 1 day    Time spent: 35 minutes    Beonka Amesquita Darleen Crocker, DO [facility]  If 7PM-7AM, please contact night-coverage [email or web address] 07/28/2021, 3:46 PM

## 2021-07-28 NOTE — Progress Notes (Signed)
    [facility] has been requested to perform a transesophageal echocardiogram on this patient for stroke tomorrow.  After careful review of history and examination, the risks and benefits of transesophageal echocardiogram have been explained including risks of esophageal damage, perforation (1:10,000 risk), bleeding, pharyngeal hematoma as well as other potential complications associated with conscious sedation including aspiration, arrhythmia, respiratory failure and death. Alternatives to treatment were discussed, questions were answered. Patient is willing to proceed.   Scheduling is closed so I will send a message to our team tomorrow Murray Hodgkins / Dr. Eleonore Chiquito) to work on formal scheduling. I did leave a message on the procedural scheduling VM. IM has already made patient NPO after midnight except sips with meds. I have written orders under [employer].  Charlie Pitter, PA-C 07/28/2021 4:18 PM

## 2021-07-28 NOTE — Progress Notes (Signed)
*  PRELIMINARY RESULTS* Echocardiogram 2D Echocardiogram has been performed.  Barry [name] 07/28/2021, 12:26 PM

## 2021-07-29 ENCOUNTER — Inpatient Hospital Stay (HOSPITAL_COMMUNITY): Payer: Medicare Other

## 2021-07-29 ENCOUNTER — Inpatient Hospital Stay (HOSPITAL_COMMUNITY): Payer: Medicare Other | Admitting: Certified Registered"

## 2021-07-29 ENCOUNTER — Encounter (HOSPITAL_COMMUNITY)
Admission: EM | Disposition: A | Discharge status: Rehabilitation Facility | Payer: Self-pay | Source: Home / Self Care | Attending: Family Medicine

## 2021-07-29 ENCOUNTER — Encounter (HOSPITAL_COMMUNITY): Payer: Self-pay | Admitting: Internal Medicine

## 2021-07-29 ENCOUNTER — Ambulatory Visit: Admit: 2021-07-29 | Payer: Medicare Other | Admitting: Cardiovascular Disease

## 2021-07-29 DIAGNOSIS — I6389 Other cerebral infarction: Secondary | ICD-10-CM

## 2021-07-29 DIAGNOSIS — I361 Nonrheumatic tricuspid (valve) insufficiency: Secondary | ICD-10-CM

## 2021-07-29 HISTORY — PX: TEE WITHOUT CARDIOVERSION: SHX5443

## 2021-07-29 LAB — GLUCOSE, CAPILLARY
Glucose-Capillary: 151 mg/dL — ABNORMAL HIGH (ref 70–99)
Glucose-Capillary: 91 mg/dL (ref 70–99)
Glucose-Capillary: 101 mg/dL — ABNORMAL HIGH (ref 70–99)
Glucose-Capillary: 118 mg/dL — ABNORMAL HIGH (ref 70–99)
Glucose-Capillary: 146 mg/dL — ABNORMAL HIGH (ref 70–99)

## 2021-07-29 LAB — BASIC METABOLIC PANEL
Anion gap: 6 (ref 5–15)
BUN: 25 mg/dL — ABNORMAL HIGH (ref 8–23)
CO2: 29 mmol/L (ref 22–32)
Calcium: 8.8 mg/dL — ABNORMAL LOW (ref 8.9–10.3)
Chloride: 103 mmol/L (ref 98–111)
Creatinine, Ser: 1.24 mg/dL (ref 0.61–1.24)
GFR, Estimated: 57 mL/min — ABNORMAL LOW (ref >60)
Glucose, Bld: 161 mg/dL — ABNORMAL HIGH (ref 70–99)
Potassium: 3.9 mmol/L (ref 3.5–5.1)
Sodium: 138 mmol/L (ref 135–145)

## 2021-07-29 LAB — CBC
HCT: 42.9 % (ref 39.0–52.0)
Hemoglobin: 14.2 g/dL (ref 13.0–17.0)
MCH: 32.3 pg (ref 26.0–34.0)
MCHC: 33.1 g/dL (ref 30.0–36.0)
MCV: 97.7 fL (ref 80.0–100.0)
Platelets: 198 10*3/uL (ref 150–400)
RBC: 4.39 MIL/uL (ref 4.22–5.81)
RDW: 13.6 % (ref 11.5–15.5)
WBC: 6.6 10*3/uL (ref 4.0–10.5)
nRBC: 0 % (ref 0.0–0.2)

## 2021-07-29 LAB — ECHO TEE
AR max vel: 1.53 cm2
AV Area VTI: 1.31 cm2
AV Area mean vel: 1.52 cm2
AV Mean grad: 23.3 mmHg
AV Peak grad: 38.5 mmHg
Ao pk vel: 3.1 m/s

## 2021-07-29 LAB — MAGNESIUM: Magnesium: 2 mg/dL (ref 1.7–2.4)

## 2021-07-29 SURGERY — ECHOCARDIOGRAM, TRANSESOPHAGEAL
Anesthesia: General

## 2021-07-29 MED ORDER — LIDOCAINE HCL (CARDIAC) PF 100 MG/5ML IV SOSY
PREFILLED_SYRINGE | INTRAVENOUS | Status: DC | PRN
  Administered 2021-07-29: 50 mg via INTRAVENOUS

## 2021-07-29 MED ORDER — SODIUM CHLORIDE BACTERIOSTATIC 0.9 % IJ SOLN
INTRAMUSCULAR | Status: AC
  Filled 2021-07-29: qty 20

## 2021-07-29 MED ORDER — CALCIUM CARBONATE ANTACID 500 MG PO CHEW
1.0000 | CHEWABLE_TABLET | Freq: Once | ORAL | Status: AC
  Administered 2021-07-29: 200 mg via ORAL
  Filled 2021-07-29: qty 1

## 2021-07-29 MED ORDER — CHLORHEXIDINE GLUCONATE 0.12 % MT SOLN: 15.0000 mL | Freq: Once | OROMUCOSAL | Status: AC

## 2021-07-29 MED ORDER — LACTATED RINGERS IV SOLN
INTRAVENOUS | Status: DC
  Administered 2021-07-29: 1000 mL via INTRAVENOUS

## 2021-07-29 MED ORDER — HYDRALAZINE HCL 20 MG/ML IJ SOLN
10.0000 mg | Freq: Four times a day (QID) | INTRAMUSCULAR | Status: DC | PRN
  Administered 2021-07-29 – 2021-07-30 (×3): 10 mg via INTRAVENOUS
  Filled 2021-07-29 (×3): qty 1

## 2021-07-29 MED ORDER — ORAL CARE MOUTH RINSE: 15.0000 mL | Freq: Once | OROMUCOSAL | Status: AC

## 2021-07-29 MED ORDER — SODIUM CHLORIDE 0.9 % IV SOLN
INTRAVENOUS | Status: DC
Start: 2021-07-29 — End: 2021-07-29

## 2021-07-29 MED ORDER — SODIUM CHLORIDE FLUSH 0.9 % IV SOLN
INTRAVENOUS | Status: AC
  Filled 2021-07-29: qty 10

## 2021-07-29 MED ORDER — PROPOFOL 10 MG/ML IV BOLUS
INTRAVENOUS | Status: DC | PRN
Start: 2021-07-29 — End: 2021-07-29
  Administered 2021-07-29 (×2): 30 mg via INTRAVENOUS
  Administered 2021-07-29: 100 mg via INTRAVENOUS
  Administered 2021-07-29: 20 mg via INTRAVENOUS
  Administered 2021-07-29: 40 mg via INTRAVENOUS

## 2021-07-29 MED ORDER — CHLORHEXIDINE GLUCONATE 0.12 % MT SOLN
OROMUCOSAL | Status: AC
  Administered 2021-07-29: 15 mL via OROMUCOSAL
  Filled 2021-07-29: qty 15

## 2021-07-29 NOTE — Progress Notes (Signed)
*  PRELIMINARY RESULTS* Echocardiogram Echocardiogram Transesophageal has been performed with saline bubble study.  Samuel Germany 07/29/2021, 3:39 PM

## 2021-07-29 NOTE — Plan of Care (Signed)
  Problem: Acute Rehab PT Goals(only PT should resolve) Goal: Pt Will Go Supine/Side To Sit Outcome: Progressing Flowsheets (Taken 07/29/2021 1136) Pt will go Supine/Side to Sit:  with minimal assist  with moderate assist Goal: Patient Will Perform Sitting Balance Outcome: Progressing Flowsheets (Taken 07/29/2021 1136) Patient will perform sitting balance:  with min guard assist  with minimal assist Goal: Pt Will Transfer Bed To Chair/Chair To Bed Outcome: Progressing Flowsheets (Taken 07/29/2021 1136) Pt will Transfer Bed to Chair/Chair to Bed: with mod assist Goal: Pt Will Ambulate Outcome: Progressing Flowsheets (Taken 07/29/2021 1136) Pt will Ambulate:  15 feet  with minimal assist  with moderate assist  with rolling walker   Problem: Acute Rehab PT Goals(only PT should resolve) Goal: Patient Will Transfer Sit To/From Stand Outcome: Progressing   11:37 AM, 07/29/21 Lonell Grandchild, MPT Physical Therapist with [facility] [phone] office [phone] mobile phone

## 2021-07-29 NOTE — Anesthesia Procedure Notes (Signed)
Date/Time: 07/29/2021 2:50 PM Performed by: Orlie Dakin, CRNA Pre-anesthesia Checklist: Patient identified, Emergency Drugs available, Suction available and Patient being monitored Patient Re-evaluated:Patient Re-evaluated prior to induction Oxygen Delivery Method: Nasal cannula Induction Type: IV induction Placement Confirmation: positive ETCO2

## 2021-07-29 NOTE — Progress Notes (Signed)
PROGRESS NOTE    Barry [name]  PFX:[id number] DOB: [birth year] DOA: 07/27/2021 PCP: Susy Frizzle, MD  Subjective: The patient was seen and examined this morning, stable no acute distress, blood pressure running high but denies any headaches visual changes, continues to have left-sided weakness patient is reporting his strength is somewhat improved on the left side..   Brief Narrative:   Barry [name] is a 87 y.o. male with medical history significant for CAD, COPD, type 2 diabetes, hypertension, GERD, allergic rhinitis, and protein calorie malnutrition who presented to the ED with complaints of weakness and vertigo for quite some time now.  He cannot state an exact timeframe, but states that both of his legs were weak bilaterally this morning as he was standing on his porch.  Patient is noted to have bilateral CVA likely cardioembolic in origin.  TEE pending for a.m.  PT/OT evaluation pending with likely need for SNF.  Assessment & Plan:   Active Problems:   CVA (cerebral vascular accident) ([place])   Left-sided hemiparesis due to bilateral CVA -Likely cardioembolic in origin, TTE with no acute findings and LVEF 60-65% -TEE 07/29/2021>>  -With noted left-sided hemiparesis, ongoing--mild improvement -PT/OT evaluation pending with likely need for SNF due to ongoing weakness -CIR also consulted -Neurology consulted recommended dual antiplatelet treatment  - 30-day Holter monitor -Continue Plavix and aspirin as ordered -Hemoglobin A1c 6.7% and LDL 84   Right-sided parotid mass -Incidental finding which will require ENT follow-up in outpatient setting   CKD stage IIIa -Baseline creatinine 1.1-1.3 -Continue to monitor   Hypertension -Hold antihypertensives and allow permissive hypertension -IV hydralazine as needed   CAD -Continue on Plavix as ordered -Continue statin   COPD with ongoing tobacco abuse -Counseled on cessation -Bronchodilators as needed   Type 2  diabetes -SSI -A1c 6.7%   GERD -PPI   DVT prophylaxis: Heparin Code Status: Full Family Communication: Son at bedside 10/11 Disposition Plan:  Status is: Inpatient  Remains inpatient appropriate because:Ongoing diagnostic testing needed not appropriate for outpatient work up and Inpatient level of care appropriate due to severity of illness  Dispo: The patient is from: Home              Anticipated d/c is to: SNF              Patient currently is not medically stable to d/c.   Difficult to place patient No   Consultants:  Neurology  Procedures:  See below  Antimicrobials:  None     Objective: Vitals:   07/29/21 0405 07/29/21 0626 07/29/21 1037 07/29/21 1323  BP: (!) 184/75 140/68 (!) 165/80 (!) 187/72  Pulse: 63 (!) 59 65 63  Resp: 19 14 16 20   Temp: (!) 97.5 F (36.4 C) 98.3 F (36.8 C) 97.9 F (36.6 C) 98.3 F (36.8 C)  TempSrc: Oral  Oral Oral  SpO2: 97% 97% 99% 98%  Weight:      Height:        Intake/Output Summary (Last 24 hours) at 07/29/2021 1420 Last data filed at 07/29/2021 0900 Gross per 24 hour  Intake 240 ml  Output 700 ml  Net -460 ml   Filed Weights   07/27/21 1531  Weight: 75.2 kg    Examination:  General exam: Appears calm and comfortable  Respiratory system: Clear to auscultation. Respiratory effort normal. Cardiovascular system: S1 & S2 heard, RRR.  Gastrointestinal system: Abdomen is soft Central nervous system: Alert and awake Extremities: No edema Skin: No significant  lesions noted Psychiatry: Flat affect.    Data Reviewed: I have personally reviewed following labs and imaging studies  CBC: Recent Labs  Lab 07/27/21 1231 07/27/21 1232 07/28/21 0548 07/29/21 0521  WBC 9.2  --  7.1 6.6  NEUTROABS 6.9  --   --   --   HGB 15.8 16.0 14.5 14.2  HCT 46.4 47.0 45.0 42.9  MCV 96.7  --  96.6 97.7  PLT 210  --  193 254   Basic Metabolic Panel: Recent Labs  Lab 07/27/21 1231 07/27/21 1232 07/28/21 0548  07/29/21 0521  NA 136 138 139 138  K 3.7 3.7 3.2* 3.9  CL 100 100 103 103  CO2 27  --  28 29  GLUCOSE 174* 173* 148* 161*  BUN 24* 22 22 25*  CREATININE 1.36* 1.30* 1.13 1.24  CALCIUM 9.4  --  9.0 8.8*  MG  --   --  1.9 2.0   GFR: Estimated Creatinine Clearance: 46.3 mL/min (by C-G formula based on SCr of 1.24 mg/dL). Liver Function Tests: Recent Labs  Lab 07/27/21 1231  AST 25  ALT 17  ALKPHOS 49  BILITOT 0.5  PROT 7.9  ALBUMIN 4.4   No results for input(s): LIPASE, AMYLASE in the last 168 hours. No results for input(s): AMMONIA in the last 168 hours. Coagulation Profile: Recent Labs  Lab 07/27/21 1231  INR 1.0   Cardiac Enzymes: No results for input(s): CKTOTAL, CKMB, CKMBINDEX, TROPONINI in the last 168 hours. BNP (last 3 results) No results for input(s): PROBNP in the last 8760 hours. HbA1C: Recent Labs    07/27/21 1231  HGBA1C 6.7*   CBG: Recent Labs  Lab 07/28/21 1624 07/28/21 2035 07/29/21 0742 07/29/21 1134 07/29/21 1327  GLUCAP 140* 164* 151* 91 101*   Lipid Profile: Recent Labs    07/27/21 1231  CHOL 164  HDL 64  LDLCALC 84  TRIG 81  CHOLHDL 2.6   Thyroid Function Tests: No results for input(s): TSH, T4TOTAL, FREET4, T3FREE, THYROIDAB in the last 72 hours. Anemia Panel: No results for input(s): VITAMINB12, FOLATE, FERRITIN, TIBC, IRON, RETICCTPCT in the last 72 hours. Sepsis Labs: No results for input(s): PROCALCITON, LATICACIDVEN in the last 168 hours.  Recent Results (from the past 240 hour(s))  Resp Panel by RT-PCR (Flu A&B, Covid) Nasopharyngeal Swab     Status: None   Collection Time: 07/27/21  2:04 PM   Specimen: Nasopharyngeal Swab; Nasopharyngeal(NP) swabs in vial transport medium  Result Value Ref Range Status   SARS Coronavirus 2 by RT PCR NEGATIVE NEGATIVE Final    Comment: (NOTE) SARS-CoV-2 target nucleic acids are NOT DETECTED.  The SARS-CoV-2 RNA is generally detectable in upper respiratory specimens during the  acute phase of infection. The lowest concentration of SARS-CoV-2 viral copies this assay can detect is 138 copies/mL. A negative result does not preclude SARS-Cov-2 infection and should not be used as the sole basis for treatment or other patient management decisions. A negative result may occur with  improper specimen collection/handling, submission of specimen other than nasopharyngeal swab, presence of viral mutation(s) within the areas targeted by this assay, and inadequate number of viral copies(<138 copies/mL). A negative result must be combined with clinical observations, patient history, and epidemiological information. The expected result is Negative.  Fact Sheet for Patients:  EntrepreneurPulse.com.au  Fact Sheet for Healthcare Providers:  IncredibleEmployment.be  This test is no t yet approved or cleared by the [place] FDA and  has been authorized for detection and/or  diagnosis of SARS-CoV-2 by FDA under an Emergency Use Authorization (EUA). This EUA will remain  in effect (meaning this test can be used) for the duration of the COVID-19 declaration under Section 564(b)(1) of the Act, 21 U.S.C.section 360bbb-3(b)(1), unless the authorization is terminated  or revoked sooner.       Influenza A by PCR NEGATIVE NEGATIVE Final   Influenza B by PCR NEGATIVE NEGATIVE Final    Comment: (NOTE) The Xpert Xpress SARS-CoV-2/FLU/RSV plus assay is intended as an aid in the diagnosis of influenza from Nasopharyngeal swab specimens and should not be used as a sole basis for treatment. Nasal washings and aspirates are unacceptable for Xpert Xpress SARS-CoV-2/FLU/RSV testing.  Fact Sheet for Patients: EntrepreneurPulse.com.au  Fact Sheet for Healthcare Providers: IncredibleEmployment.be  This test is not yet approved or cleared by the [place] FDA and has been authorized for detection and/or  diagnosis of SARS-CoV-2 by FDA under an Emergency Use Authorization (EUA). This EUA will remain in effect (meaning this test can be used) for the duration of the COVID-19 declaration under Section 564(b)(1) of the Act, 21 U.S.C. section 360bbb-3(b)(1), unless the authorization is terminated or revoked.  Performed at [facility], [address], [place], [place] [address]          Radiology Studies: MR BRAIN WO CONTRAST  Result Date: 07/27/2021 CLINICAL DATA:  Neuro deficit, stroke suspected EXAM: MRI HEAD WITHOUT CONTRAST TECHNIQUE: Multiplanar, multiecho pulse sequences of the brain and surrounding structures were obtained without intravenous contrast. COMPARISON:  Same day CT head, MRI 12/17/2018. FINDINGS: Brain: Restricted diffusion in the right posterior lentiform nucleus, right thalamus, and tail of the caudate (series 5, image 18, with additional punctate infarct lateral left thalamus/medial left lentiform nucleus (series 5, image 18). Possible punctate focus in the left lentiform nucleus (series 5, image 17), with possible ADC correlate. Confluent T2 hyperintense signal in the periventricular white matter and pons, likely the sequela of severe chronic small vessel ischemic disease. Foci of susceptibility in the posterior left frontal lobe (series 9, images 52 and 47), likely sequela of remote hypertensive microhemorrhage. Vascular: Normal flow voids. Skull and upper cervical spine: Normal marrow signal. Sinuses/Orbits: Negative.  Status post bilateral lens replacements. Other: Fluid throughout the left mastoid air cells. IMPRESSION: Acute infarcts in the right posterior lentiform nucleus, thalamus, and tail of the caudate, with additional punctate infarct in the lateral left thalamus and possibly the left lentiform nucleus. Given multiple vascular territories, consider an embolic etiology. These results were called by telephone at the time of interpretation on 07/27/2021 at 6:02 PM to  provider [facility] , who verbally acknowledged these results. Electronically Signed   By: Merilyn Baba M.D.   On: 07/27/2021 18:02   ECHOCARDIOGRAM COMPLETE  Result Date: 07/28/2021    ECHOCARDIOGRAM REPORT   Patient Name:   SHERMAR FRIEDLAND Date of Exam: 07/28/2021 Medical Rec #:  [id number]         Height:       71.0 in Accession #:    [id number]        Weight:       165.8 lb Date of Birth:  1932-11-30        BSA:          1.947 m Patient Age:    7 years          BP:           186/64 mmHg Patient Gender: M  HR:           58 bpm. Exam Location:  [facility] Procedure: 2D Echo, Cardiac Doppler and Color Doppler Indications:    Stroke  History:        Patient has prior history of Echocardiogram examinations, most                 recent 04/09/2020. CAD, Stroke and COPD, Arrythmias:PVC,                 Signs/Symptoms:Chest Pain; Risk Factors:Hypertension, Diabetes,                 Dyslipidemia and Current Smoker. AAA.  Sonographer:    Wenda Low Referring Phys: [id number] [place]ert[place] D [place]  1. Left ventricular ejection fraction, by estimation, is 60 to 65%. The left ventricle has normal function. The left ventricle has no regional wall motion abnormalities. There is mild left ventricular hypertrophy. Left ventricular diastolic parameters are consistent with Grade I diastolic dysfunction (impaired relaxation).  2. Right ventricular systolic function is normal. The right ventricular size is normal.  3. The mitral valve is normal in structure. No evidence of mitral valve regurgitation. No evidence of mitral stenosis.  4. The aortic valve is normal in structure. There is moderate calcification of the aortic valve. There is moderate thickening of the aortic valve. Aortic valve regurgitation is not visualized. Mild to moderate aortic valve stenosis. Aortic valve area, by VTI measures 1.31 cm. Aortic valve mean gradient measures 14.0 mmHg. Aortic valve Vmax measures 2.64 m/s.  5. The  inferior vena cava is normal in size with greater than 50% respiratory variability, suggesting right atrial pressure of 3 mmHg. Conclusion(s)/Recommendation(s): No intracardiac source of embolism detected on this transthoracic study. A transesophageal echocardiogram is recommended to exclude cardiac source of embolism if clinically indicated. FINDINGS  Left Ventricle: Left ventricular ejection fraction, by estimation, is 60 to 65%. The left ventricle has normal function. The left ventricle has no regional wall motion abnormalities. The left ventricular internal cavity size was normal in size. There is  mild left ventricular hypertrophy. Left ventricular diastolic parameters are consistent with Grade I diastolic dysfunction (impaired relaxation). Right Ventricle: The right ventricular size is normal. No increase in right ventricular wall thickness. Right ventricular systolic function is normal. Left Atrium: Left atrial size was normal in size. Right Atrium: Right atrial size was normal in size. Pericardium: There is no evidence of pericardial effusion. Mitral Valve: The mitral valve is normal in structure. Mild mitral annular calcification. No evidence of mitral valve regurgitation. No evidence of mitral valve stenosis. MV peak gradient, 5.7 mmHg. The mean mitral valve gradient is 2.0 mmHg. Tricuspid Valve: The tricuspid valve is normal in structure. Tricuspid valve regurgitation is not demonstrated. No evidence of tricuspid stenosis. Aortic Valve: The aortic valve is normal in structure. There is moderate calcification of the aortic valve. There is moderate thickening of the aortic valve. Aortic valve regurgitation is not visualized. Mild to moderate aortic stenosis is present. Aortic valve mean gradient measures 14.0 mmHg. Aortic valve peak gradient measures 27.9 mmHg. Aortic valve area, by VTI measures 1.31 cm. Pulmonic Valve: The pulmonic valve was normal in structure. Pulmonic valve regurgitation is not  visualized. No evidence of pulmonic stenosis. Aorta: The aortic root is normal in size and structure. Venous: The inferior vena cava is normal in size with greater than 50% respiratory variability, suggesting right atrial pressure of 3 mmHg. IAS/Shunts: No atrial level shunt detected by color flow  Doppler.  LEFT VENTRICLE PLAX 2D LVIDd:         4.00 cm      Diastology LVIDs:         2.80 cm      LV e' medial:    6.85 cm/s LV PW:         1.40 cm      LV E/e' medial:  9.7 LV IVS:        1.30 cm      LV e' lateral:   7.94 cm/s LVOT diam:     2.00 cm      LV E/e' lateral: 8.4 LV SV:         79 LV SV Index:   41 LVOT Area:     3.14 cm  LV Volumes (MOD) LV vol d, MOD A2C: 99.3 ml LV vol d, MOD A4C: 121.0 ml LV vol s, MOD A2C: 53.2 ml LV vol s, MOD A4C: 57.2 ml LV SV MOD A2C:     46.1 ml LV SV MOD A4C:     121.0 ml LV SV MOD BP:      56.8 ml RIGHT VENTRICLE RV Basal diam:  3.50 cm RV Mid diam:    3.00 cm RV S prime:     14.70 cm/s TAPSE (M-mode): 2.6 cm LEFT ATRIUM             Index        RIGHT ATRIUM           Index LA diam:        3.30 cm 1.69 cm/m   RA Area:     19.50 cm LA Vol (A2C):   70.3 ml 36.11 ml/m  RA Volume:   52.90 ml  27.17 ml/m LA Vol (A4C):   46.2 ml 23.73 ml/m LA Biplane Vol: 58.8 ml 30.20 ml/m  AORTIC VALVE                     PULMONIC VALVE AV Area (Vmax):    1.16 cm      PV Vmax:       1.05 m/s AV Area (Vmean):   1.08 cm      PV Peak grad:  4.4 mmHg AV Area (VTI):     1.31 cm AV Vmax:           264.25 cm/s AV Vmean:          175.250 cm/s AV VTI:            0.608 m AV Peak Grad:      27.9 mmHg AV Mean Grad:      14.0 mmHg LVOT Vmax:         97.90 cm/s LVOT Vmean:        60.100 cm/s LVOT VTI:          0.253 m LVOT/AV VTI ratio: 0.42  AORTA Ao Root diam: 3.00 cm MITRAL VALVE MV Area (PHT): 1.72 cm     SHUNTS MV Area VTI:   2.03 cm     Systemic VTI:  0.25 m MV Peak grad:  5.7 mmHg     Systemic Diam: 2.00 cm MV Mean grad:  2.0 mmHg MV Vmax:       1.19 m/s MV Vmean:      57.0 cm/s MV Decel Time:  441 msec MV E velocity: 66.40 cm/s MV A velocity: 108.00 cm/s MV E/A ratio:  0.61 Candee Furbish MD Electronically signed by Candee Furbish MD  Signature Date/Time: 07/28/2021/2:58:35 PM    Final         Scheduled Meds:  [MAR Hold] aspirin EC  81 mg Oral q morning   [MAR Hold] clonazePAM  0.25 mg Oral TID AC & HS   [MAR Hold] clopidogrel  75 mg Oral Daily   [MAR Hold] ezetimibe  10 mg Oral Daily   [MAR Hold] FLUoxetine  20 mg Oral Daily   [MAR Hold] heparin  5,000 Units Subcutaneous Q8H   influenza vaccine adjuvanted  0.5 mL Intramuscular Tomorrow-1000   [MAR Hold] insulin aspart  0-5 Units Subcutaneous QHS   [MAR Hold] insulin aspart  0-9 Units Subcutaneous TID WC   [MAR Hold] loratadine  10 mg Oral Daily   [MAR Hold] montelukast  10 mg Oral QHS   [MAR Hold] pantoprazole  40 mg Oral Daily   pneumococcal 23 valent vaccine  0.5 mL Intramuscular Tomorrow-1000   [MAR Hold] simvastatin  40 mg Oral QHS   [MAR Hold] vitamin B-12  1,000 mcg Oral Daily     LOS: 2 days    Time spent: 35 minutes   SIGNED: Deatra James, MD, FHM. [facility],  Pager (please use [email or web address] to page/text)  Please use Epic Secure Chat for non-urgent communication (7AM-7PM) If 7PM-7AM, please contact night-coverage [email or web address],  07/29/2021, 2:23 PM

## 2021-07-29 NOTE — Transfer of Care (Signed)
Immediate Anesthesia Transfer of Care Note  Patient: Barry [name] [facility]  Procedure(s) Performed: TRANSESOPHAGEAL ECHOCARDIOGRAM (TEE)  Patient Location: PACU  Anesthesia Type:General  Level of Consciousness: drowsy  Airway & Oxygen Therapy: Patient Spontanous Breathing and Patient connected to nasal cannula oxygen  Post-op Assessment: Report given to RN and Post -op Vital signs reviewed and stable  Post vital signs: Reviewed and stable  Last Vitals:  Vitals Value Taken Time  BP    Temp    Pulse 69 07/29/21 1505  Resp 19 07/29/21 1505  SpO2 95 % 07/29/21 1505  Vitals shown include unvalidated device data.  Last Pain:  Vitals:   07/29/21 1323  TempSrc: Oral  PainSc: 0-No pain      Patients Stated Pain Goal: 0 (79/44/46 1901)  Complications: No notable events documented.

## 2021-07-29 NOTE — Evaluation (Signed)
Occupational Therapy Evaluation Patient Details Name: Barry [name] MRN: [id number] DOB: 10-01-1933 Today's Date: 07/29/2021   History of Present Illness Barry [name] is a 87 y.o. male with medical history significant for CAD, COPD, type 2 diabetes, hypertension, GERD, allergic rhinitis, and protein calorie malnutrition who presented to the ED with complaints of weakness and vertigo for quite some time now.  He cannot state an exact timeframe, but states that both of his legs were weak bilaterally this morning as he was standing on his porch.  This was at approximately 0930.  After he fell he noticed that his left arm was quite weak as well.  He states that he gets headaches intermittently, but denies any speech slurring, or vision changes.  He states that he continues to have some jerking to his legs intermittently as well.  He states that he has been compliant on his home medications otherwise.  He has recently had MRA of the head and neck performed by his PCP to evaluate for posterior circulation insufficiency which was negative.   Clinical Impression   Pt agreeable to OT/PT co-evaluation. Pt was independent prior to recent stroke. Pt presents with L side weakness with most pronounced weakness at 3/5 MMT for the L shoulder. Pt required mod to max A for supine to sit bed mobility and max A for sit to stand and stand pivot transfer to chair using RW. Pt demonstrates poor sitting and standing balance with decreased coordination in L UE. Pt will benefit from continued OT in the hospital and recommended venue below to increase strength, balance, and endurance for safe ADL's.        Recommendations for follow up therapy are one component of a multi-disciplinary discharge planning process, led by the attending physician.  Recommendations may be updated based on patient status, additional functional criteria and insurance authorization.   Follow Up Recommendations  CIR    Equipment  Recommendations  None recommended by OT           Precautions / Restrictions Precautions Precautions: Fall Restrictions Weight Bearing Restrictions: No      Mobility Bed Mobility Overal bed mobility: Needs Assistance Bed Mobility: Supine to Sit     Supine to sit: Mod assist;Max assist     General bed mobility comments: slow labored movement; poor sitting balance    Transfers Overall transfer level: Needs assistance Equipment used: Rolling walker (2 wheeled) Transfers: Stand Pivot Transfers;Sit to/from Stand Sit to Stand: Max assist Stand pivot transfers: Max assist       General transfer comment: L LE buckling during trasnfer; pt able to grasp RW with L UE but demonstrates very poor standing balance.    Balance Overall balance assessment: Needs assistance Sitting-balance support: Bilateral upper extremity supported;Feet supported Sitting balance-Leahy Scale: Poor Sitting balance - Comments: sitting at EOB Postural control: Left lateral lean Standing balance support: Bilateral upper extremity supported;During functional activity Standing balance-Leahy Scale: Poor Standing balance comment: using RW                           ADL either performed or assessed with clinical judgement   ADL Overall ADL's : Needs assistance/impaired Eating/Feeding: Sitting;Minimal assistance   Grooming: Sitting;Minimal assistance;Moderate assistance   Upper Body Bathing: Minimal assistance;Sitting;Min guard   Lower Body Bathing: Moderate assistance;Maximal assistance;Sitting/lateral leans   Upper Body Dressing : Min guard;Minimal assistance;Sitting   Lower Body Dressing: Moderate assistance;Maximal assistance;Sitting/lateral leans   Toilet Transfer: Maximal assistance;RW;Stand-pivot  Toilet Transfer Details (indicate cue type and reason): Simulated via EOB to chair transfer with RW Toileting- Clothing Manipulation and Hygiene: Maximal assistance;Sitting/lateral lean    Tub/ Shower Transfer: Maximal assistance;Stand-pivot;Rolling walker;Tub bench   Functional mobility during ADLs: Maximal assistance;Rolling walker General ADL Comments: All levels taken per clinical judgment based on bed mobility, EOB to chair transfer, and UE assessment.     Vision Baseline Vision/History: 1 Wears glasses (reading) Ability to See in Adequate Light: 0 Adequate Patient Visual Report: No change from baseline Vision Assessment?: No apparent visual deficits                Pertinent Vitals/Pain Pain Assessment: No/denies pain     Hand Dominance Right   Extremity/Trunk Assessment Upper Extremity Assessment Upper Extremity Assessment: LUE deficits/detail LUE Deficits / Details: 3/5 shoulder MMT; elbow 4/5 flexion, 4/5 grip; 4-/5 wrist flexion, 4+/ wrist extension. Slow pace for sequential finger touching. Difficulty with proprioceptive task of touching nose with eyes closed. LUE Sensation: decreased proprioception LUE Coordination: decreased fine motor;decreased gross motor   Lower Extremity Assessment Lower Extremity Assessment: Defer to PT evaluation   Cervical / Trunk Assessment Cervical / Trunk Assessment: Normal   Communication Communication Communication: No difficulties   Cognition Arousal/Alertness: Awake/alert Behavior During Therapy: WFL for tasks assessed/performed Overall Cognitive Status: Within Functional Limits for tasks assessed                                                Home Living Family/patient expects to be discharged to:: Private residence Living Arrangements: Alone Available Help at Discharge: Family;Available PRN/intermittently Type of Home: Mobile home Home Access: Stairs to enter Entrance Stairs-Number of Steps: 3 Entrance Stairs-Rails: Right;Left;Can reach both Home Layout: One level     Bathroom Shower/Tub: Teacher, early years/pre: Standard     Home Equipment: Environmental consultant - 2 wheels;Cane -  single point;Wheelchair - manual   Additional Comments: Son works and lives in [place]. Assist would be after work.      Prior Functioning/Environment Level of Independence: Independent        Comments: Pt was indepdnent for ADL's and IADL's prior. Pt reported independent ambulation with rare use of a cane.        OT Problem List: Decreased strength;Decreased activity tolerance;Impaired balance (sitting and/or standing);Decreased coordination;Impaired UE functional use      OT Treatment/Interventions: Self-care/ADL training;Therapeutic exercise;Patient/family education;Balance training;Therapeutic activities;Neuromuscular education;DME and/or AE instruction    OT Goals(Current goals can be found in the care plan section) Acute Rehab OT Goals Patient Stated Goal: return home OT Goal Formulation: With patient Time For Goal Achievement: 08/12/21 Potential to Achieve Goals: Good  OT Frequency: Min 2X/week   Barriers to D/C:            Co-evaluation PT/OT/SLP Co-Evaluation/Treatment: Yes Reason for Co-Treatment: Complexity of the patient's impairments (multi-system involvement)   OT goals addressed during session: ADL's and self-care;Strengthening/ROM                       End of Session Equipment Utilized During Treatment: Rolling walker  Activity Tolerance: Patient tolerated treatment well Patient left: in chair;with call bell/phone within reach;with family/visitor present  OT Visit Diagnosis: Unsteadiness on feet (R26.81);Muscle weakness (generalized) (M62.81);History of falling (Z91.81);Hemiplegia and hemiparesis Hemiplegia - Right/Left: Left Hemiplegia - dominant/non-dominant: Non-Dominant Hemiplegia - caused by:  Cerebral infarction                Time: 0525-9102 OT Time Calculation (min): 24 min Charges:  OT General Charges $OT Visit: 1 Visit OT Evaluation $OT Eval Moderate Complexity: 1 Mod  Trevonne Nyland OT, MOT  Larey Seat 07/29/2021, 9:45 AM

## 2021-07-29 NOTE — Plan of Care (Signed)
  Problem: Acute Rehab OT Goals (only OT should resolve) Goal: Pt. Will Perform Eating Flowsheets (Taken 07/29/2021 0948) Pt Will Perform Eating:  with set-up  with modified independence  sitting Goal: Pt. Will Perform Grooming Flowsheets (Taken 07/29/2021 0948) Pt Will Perform Grooming:  standing  with mod assist  with adaptive equipment Goal: Pt. Will Perform Lower Body Bathing Flowsheets (Taken 07/29/2021 0948) Pt Will Perform Lower Body Bathing:  with min assist  with min guard assist  sitting/lateral leans  with adaptive equipment Goal: Pt. Will Perform Upper Body Dressing Flowsheets (Taken 07/29/2021 0948) Pt Will Perform Upper Body Dressing:  with modified independence  sitting Goal: Pt. Will Perform Lower Body Dressing Flowsheets (Taken 07/29/2021 0948) Pt Will Perform Lower Body Dressing:  with min assist  with min guard assist  sitting/lateral leans  with adaptive equipment Goal: Pt. Will Transfer To Toilet Flowsheets (Taken 07/29/2021 [phone]) Pt Will Transfer to Toilet:  with min assist  stand pivot transfer  bedside commode Goal: Pt. Will Perform Toileting-Clothing Manipulation Flowsheets (Taken 07/29/2021 0948) Pt Will Perform Toileting - Clothing Manipulation and hygiene:  with min assist  with min guard assist  sitting/lateral leans Goal: Pt/Caregiver Will Perform Home Exercise Program Flowsheets (Taken 07/29/2021 [phone]) Pt/caregiver will Perform Home Exercise Program:  Increased strength  Left upper extremity  With Supervision  Barry [name] OT, MOT

## 2021-07-29 NOTE — CV Procedure (Signed)
    TRANSESOPHAGEAL ECHOCARDIOGRAM   NAME:  SHAHIEM BEDWELL    MRN: [id number] DOB:  [birth year]    ADMIT DATE: 07/27/2021  INDICATIONS: Stroke  PROCEDURE:   Informed consent was obtained prior to the procedure. The risks, benefits and alternatives for the procedure were discussed and the patient comprehended these risks.  Risks include, but are not limited to, cough, sore throat, vomiting, nausea, somnolence, esophageal and stomach trauma or perforation, bleeding, low blood pressure, aspiration, pneumonia, infection, trauma to the teeth and death.    Procedural time out performed. The oropharynx was anesthetized with topical 1% benzocaine.    Anesthesia was administered by Dr. Sula Soda.  The patient was administered 250 mg of propofol and 50 mg of lidocaine to achieve and maintain moderate conscious sedation.  The patient's heart rate, blood pressure, and oxygen saturation are monitored continuously during the procedure. The period of conscious sedation is 15 minutes, of which I was present face-to-face 100% of this time.   The transesophageal probe was inserted in the esophagus and stomach without difficulty and multiple views were obtained.   COMPLICATIONS:    There were no immediate complications.  KEY FINDINGS:  No LAA thrombus.  No PFO by bubble.  Moderate AS. Normal LV/RV function.   Full report to follow. Further management per primary team.   [name] T. Audie Box, MD, [place]  [address], [place], [place] [address] [phone]  3:04 PM

## 2021-07-29 NOTE — Anesthesia Preprocedure Evaluation (Addendum)
Anesthesia Evaluation  Patient identified by MRN, date of birth, ID band Patient awake    Reviewed: Allergy & Precautions, NPO status , Patient's Chart, lab work & pertinent test results, reviewed documented beta blocker date and time   History of Anesthesia Complications Negative for: history of anesthetic complications  Airway Mallampati: II  TM Distance: >3 FB Neck ROM: Full    Dental  (+) Dental Advisory Given, Missing   Pulmonary COPD,  COPD inhaler, Current Smoker and Patient abstained from smoking.,    Pulmonary exam normal breath sounds clear to auscultation       Cardiovascular hypertension, Pt. on medications and Pt. on home beta blockers + CAD and + Peripheral Vascular Disease (AAA)  + dysrhythmias (PVCs) + Valvular Problems/Murmurs AS  Rhythm:Irregular Rate:Normal + Systolic murmurs    Neuro/Psych PSYCHIATRIC DISORDERS Anxiety Depression CVA (left sided weakness), Residual Symptoms    GI/Hepatic GERD  Medicated,(+) Hepatitis -  Endo/Other  diabetes  Renal/GU Renal disease  negative genitourinary   Musculoskeletal negative musculoskeletal ROS (+)   Abdominal   Peds negative pediatric ROS (+)  Hematology negative hematology ROS (+)   Anesthesia Other Findings   Reproductive/Obstetrics negative OB ROS                            Anesthesia Physical Anesthesia Plan  ASA: 4  Anesthesia Plan: General   Post-op Pain Management:    Induction: Intravenous  PONV Risk Score and Plan: TIVA  Airway Management Planned: Nasal Cannula and Natural Airway  Additional Equipment:   Intra-op Plan:   Post-operative Plan:   Informed Consent: I have reviewed the patients History and Physical, chart, labs and discussed the procedure including the risks, benefits and alternatives for the proposed anesthesia with the patient or authorized representative who has indicated his/her  understanding and acceptance.     Dental advisory given  Plan Discussed with: CRNA and Surgeon  Anesthesia Plan Comments:         Anesthesia Quick Evaluation

## 2021-07-29 NOTE — Evaluation (Signed)
Physical Therapy Evaluation Patient Details Name: Barry [name] MRN: [id number] DOB: 1933-01-15 Today's Date: 07/29/2021  History of Present Illness  Barry [name] is a 87 y.o. male with medical history significant for CAD, COPD, type 2 diabetes, hypertension, GERD, allergic rhinitis, and protein calorie malnutrition who presented to the ED with complaints of weakness and vertigo for quite some time now.  He cannot state an exact timeframe, but states that both of his legs were weak bilaterally this morning as he was standing on his porch.  This was at approximately 0930.  After he fell he noticed that his left arm was quite weak as well.  He states that he gets headaches intermittently, but denies any speech slurring, or vision changes.  He states that he continues to have some jerking to his legs intermittently as well.  He states that he has been compliant on his home medications otherwise.  He has recently had MRA of the head and neck performed by his PCP to evaluate for posterior circulation insufficiency which was negative.   Clinical Impression  Patient demonstrates slow labored movement for sitting up at bedside with frequent falling over to the left requiring Min/mod assist to keep trunk in midline most of time, can lift LLE against gravity, but 0/5 ankle dorsiflexion, and has difficulty standing due to poor coordination of LLE with frequent buckling of knee.  Patient limited to a few side steps requiring Max assist tactile cueing to slide LLE before having to sit due to poor standing balance and c/o fatigue.  Patient tolerated sitting up in chair after therapy with his son present in room.  Patient will benefit from continued physical therapy in hospital and recommended venue below to increase strength, balance, endurance for safe ADLs and gait.          Recommendations for follow up therapy are one component of a multi-disciplinary discharge planning process, led by the attending  physician.  Recommendations may be updated based on patient status, additional functional criteria and insurance authorization.  Follow Up Recommendations CIR    Equipment Recommendations  None recommended by PT    Recommendations for Other Services       Precautions / Restrictions Precautions Precautions: Fall Restrictions Weight Bearing Restrictions: No      Mobility  Bed Mobility Overal bed mobility: Needs Assistance Bed Mobility: Supine to Sit     Supine to sit: Mod assist;Max assist     General bed mobility comments: as per OT notes    Transfers Overall transfer level: Needs assistance Equipment used: Rolling walker (2 wheeled) Transfers: Stand Pivot Transfers;Sit to/from Stand Sit to Stand: Max assist Stand pivot transfers: Max assist       General transfer comment: as per OT notes  Ambulation/Gait Ambulation/Gait assistance: Max assist Gait Distance (Feet): 4 Feet Assistive device: Rolling walker (2 wheeled) Gait Pattern/deviations: Decreased step length - right;Decreased step length - left;Decreased stance time - left;Decreased stride length;Decreased dorsiflexion - left;Shuffle Gait velocity: slow   General Gait Details: limited to a few slow labored side steps with frequent buckling of left knee, poor coordination, required Max tactile assistance to shuffle LLE when attempting steps  Stairs            Wheelchair Mobility    Modified Rankin (Stroke Patients Only)       Balance Overall balance assessment: Needs assistance Sitting-balance support: Feet supported;No upper extremity supported Sitting balance-Leahy Scale: Poor Sitting balance - Comments: sitting at EOB Postural control: Left lateral  lean Standing balance support: During functional activity;Bilateral upper extremity supported Standing balance-Leahy Scale: Poor Standing balance comment: using RW                             Pertinent Vitals/Pain Pain Assessment:  No/denies pain    Home Living Family/patient expects to be discharged to:: Private residence Living Arrangements: Alone Available Help at Discharge: Family;Available PRN/intermittently Type of Home: Mobile home Home Access: Stairs to enter Entrance Stairs-Rails: Right;Left;Can reach both Entrance Stairs-Number of Steps: 3 Home Layout: One level Home Equipment: Walker - 2 wheels;Cane - single point;Wheelchair - manual Additional Comments: Son works and lives in [place]. Assist would be after work.    Prior Function Level of Independence: Independent         Comments: Hydrographic surveyor, drives     Hand Dominance   Dominant Hand: Right    Extremity/Trunk Assessment   Upper Extremity Assessment Upper Extremity Assessment: Defer to OT evaluation LUE Deficits / Details: 3/5 shoulder MMT; elbow 4/5 flexion, 4/5 grip; 4-/5 wrist flexion, 4+/ wrist extension. Slow pace for sequential finger touching. Difficulty with proprioceptive task of touching nose with eyes closed. LUE Sensation: decreased proprioception LUE Coordination: decreased fine motor;decreased gross motor    Lower Extremity Assessment Lower Extremity Assessment: Generalized weakness;LLE deficits/detail LLE Deficits / Details: grossly 3+/5 except anke dorsiflexion 0/5 LLE Sensation: WNL LLE Coordination: decreased gross motor    Cervical / Trunk Assessment Cervical / Trunk Assessment: Normal  Communication   Communication: No difficulties  Cognition Arousal/Alertness: Awake/alert Behavior During Therapy: WFL for tasks assessed/performed Overall Cognitive Status: Within Functional Limits for tasks assessed                                        General Comments      Exercises     Assessment/Plan    PT Assessment Patient needs continued PT services  PT Problem List Decreased strength;Decreased activity tolerance;Decreased balance;Decreased mobility;Decreased coordination        PT Treatment Interventions DME instruction;Gait training;Functional mobility training;Stair training;Therapeutic activities;Therapeutic exercise;Patient/family education;Balance training;Neuromuscular re-education    PT Goals (Current goals can be found in the Care Plan section)  Acute Rehab PT Goals Patient Stated Goal: return home PT Goal Formulation: With patient/family Time For Goal Achievement: 08/12/21 Potential to Achieve Goals: Fair    Frequency Min 4X/week   Barriers to discharge        Co-evaluation PT/OT/SLP Co-Evaluation/Treatment: Yes Reason for Co-Treatment: Complexity of the patient's impairments (multi-system involvement);To address functional/ADL transfers PT goals addressed during session: Mobility/safety with mobility;Balance;Proper use of DME OT goals addressed during session: ADL's and self-care;Strengthening/ROM       AM-PAC PT "6 Clicks" Mobility  Outcome Measure Help needed turning from your back to your side while in a flat bed without using bedrails?: A Lot Help needed moving from lying on your back to sitting on the side of a flat bed without using bedrails?: A Lot Help needed moving to and from a bed to a chair (including a wheelchair)?: A Lot Help needed standing up from a chair using your arms (e.g., wheelchair or bedside chair)?: A Lot Help needed to walk in hospital room?: Total Help needed climbing 3-5 steps with a railing? : Total 6 Click Score: 10    End of Session Equipment Utilized During Treatment: Gait belt Activity Tolerance: Patient  tolerated treatment well;Patient limited by fatigue Patient left: in chair;with call bell/phone within reach;with family/visitor present Nurse Communication: Mobility status PT Visit Diagnosis: Unsteadiness on feet (R26.81);Other abnormalities of gait and mobility (R26.89);Muscle weakness (generalized) (M62.81)    Time: 6314-9702 PT Time Calculation (min) (ACUTE ONLY): 30 min   Charges:   PT  Evaluation $PT Eval Moderate Complexity: 1 Mod PT Treatments $Therapeutic Activity: 23-37 mins        11:35 AM, 07/29/21 Lonell Grandchild, MPT Physical Therapist with [facility] 336 [phone] office [phone] mobile phone

## 2021-07-29 NOTE — Progress Notes (Signed)
Inpatient Rehabilitation Admissions Coordinator   Inpatient rehab consult received. I spoke with son, Coralyn Pear, at bedside by phone. We discussed goals and expectations of a possible CIR admit. Prior to admit patient lived alone. Son and his wife work and would not be able to provide caregiver support 8 until 5. I discussed the need for caregiver supports when they work, for patient unlikely to reach Mod I level to return home alone after a 2 week CIR stay. Son will discuss with his Dad and his wife and follow up with me tomorrow. He would benefit from a CIR admit if 24/7 caregiver supports could be arranged. Otherwise he will need SNF rehab to provide an extended Rehab recovery. I will alert acute team and TOC and follow up with patient and family tomorrow.   Danne Baxter, RN, MSN Rehab Admissions Coordinator [phone] 07/29/2021 12:08 PM

## 2021-07-29 NOTE — Interval H&P Note (Signed)
History and Physical Interval Note:  07/29/2021 2:43 PM  Barry [name] [facility]  has presented today for surgery, with the diagnosis of stroke.  The various methods of treatment have been discussed with the patient and family. After consideration of risks, benefits and other options for treatment, the patient has consented to  Procedure(s): TRANSESOPHAGEAL ECHOCARDIOGRAM (TEE) (N/A) as a surgical intervention.  The patient's history has been reviewed, patient examined, no change in status, stable for surgery.  I have reviewed the patient's chart and labs.  Questions were answered to the patient's satisfaction.     NPO for stroke TEE.  [name] T. Audie Box, MD, [place]  [address], [place], [place] [address] [phone]  2:44 PM

## 2021-07-29 NOTE — Progress Notes (Addendum)
Present: Reed Breech, RN Lorna Few, RN Alvino Chapel (Echo tech) Vania Rea Dr. Audie Box  Patient moved from pre-op to Endo 3.  CRNA present. Physician at bedside. Time out performed. Bubble study  performed per protocol.   Case completed and moved to PACU for recovery.  Pt's son notified by provider.

## 2021-07-29 NOTE — Anesthesia Postprocedure Evaluation (Signed)
Anesthesia Post Note  Patient: Barry [name] [facility]  Procedure(s) Performed: TRANSESOPHAGEAL ECHOCARDIOGRAM (TEE)  Patient location during evaluation: PACU Anesthesia Type: General Level of consciousness: awake and alert Pain management: pain level controlled Vital Signs Assessment: post-procedure vital signs reviewed and stable Respiratory status: spontaneous breathing, nonlabored ventilation and respiratory function stable Cardiovascular status: blood pressure returned to baseline and stable Postop Assessment: no apparent nausea or vomiting Anesthetic complications: no   No notable events documented.   Last Vitals:  Vitals:   07/29/21 1527 07/29/21 1536  BP: (!) 177/84 (!) 183/81  Pulse: 72   Resp: (!) 9 (!) 24  Temp: 36.5 C   SpO2: 97% 97%    Last Pain:  Vitals:   07/29/21 1527  TempSrc: Oral  PainSc:                  [name]

## 2021-07-29 NOTE — TOC Initial Note (Signed)
Transition of Care [facility]) - Initial/Assessment Note    Patient Details  Name: Barry [name] MRN: [id number] Date of Birth: 04-04-1933  Transition [facility]) CM/SW Contact:    Boneta Lucks, RN Phone Number: 07/29/2021, 3:03 PM  Clinical Narrative:    Patient admitted with CVA. Patient lives home alone. PT is recommending CIR. TOC spoke with Son- Barry [name] and his wife. They want him to get the best rehab possible and are working on a home plan after 2 weeks of CIR. Patient is getting TEE at present time. TOC to follow.            Expected Discharge Plan: IP Rehab Facility Barriers to Discharge: Continued Medical Work up  Patient Goals and CMS Choice Patient states their goals for this hospitalization and ongoing recovery are:: agreeable to CIR CMS Medicare.gov Compare Post Acute Care list provided to:: Patient Represenative (must comment) Choice offered to / list presented to : Adult Children  Expected Discharge Plan and Services Expected Discharge Plan: [place]     Living arrangements for the past 2 months: Single Family Home                   Prior Living Arrangements/Services Living arrangements for the past 2 months: Single Family Home Lives with:: Self Patient language and need for interpreter reviewed:: Yes        Need for Family Participation in Patient Care: Yes (Comment) Care giver support system in place?: Yes (comment)   Criminal Activity/Legal Involvement Pertinent to Current Situation/Hospitalization: No - Comment as needed  Activities of Daily Living Home Assistive Devices/Equipment: None ADL Screening (condition at time of admission) Patient's cognitive ability adequate to safely complete daily activities?: Yes Is the patient deaf or have difficulty hearing?: Yes Does the patient have difficulty seeing, even when wearing glasses/contacts?: No Does the patient have difficulty concentrating, remembering, or making decisions?: No Patient able  to express need for assistance with ADLs?: Yes Does the patient have difficulty dressing or bathing?: Yes Independently performs ADLs?: No Communication: Independent Dressing (OT): Dependent Is this a change from baseline?: Change from baseline, expected to last >3 days Grooming: Dependent Is this a change from baseline?: Change from baseline, expected to last >3 days Feeding: Needs assistance Is this a change from baseline?: Change from baseline, expected to last >3 days Bathing: Dependent Is this a change from baseline?: Change from baseline, expected to last >3 days Toileting: Dependent Is this a change from baseline?: Change from baseline, expected to last >3days In/Out Bed: Dependent Is this a change from baseline?: Change from baseline, expected to last >3 days Walks in Home: Dependent Is this a change from baseline?: Change from baseline, expected to last >3 days Does the patient have difficulty walking or climbing stairs?: Yes Weakness of Legs: Both Weakness of Arms/Hands: Left  Permission Sought/Granted  Emotional Assessment      Orientation: : Oriented to Self, Oriented to Place Alcohol / Substance Use: Not Applicable Psych Involvement: No (comment)  Admission diagnosis:  CVA (cerebral vascular accident) [facility] [place]) [I63.9] Cerebrovascular accident (CVA), unspecified mechanism ([place]) [I63.9] Patient Active Problem List   Diagnosis Date Noted   CVA (cerebral vascular accident) ([place]) 07/27/2021   Frequent PVCs 03/20/2020   Aortic stenosis 03/20/2020   COPD (chronic obstructive pulmonary disease) ([place]) 03/20/2020   Constipation 04/30/2019   Protein-calorie malnutrition ([place]) 04/30/2019   Anxiety with depression 12/05/2018   Seasonal and perennial allergic rhinitis 08/09/2018   Carotid artery disease ([place])  11/18/2017   Hx of colonic polyps 08/26/2015   Erectile dysfunction 02/17/2015   AAA (abdominal aortic aneurysm) without rupture ([place]) 05/21/2014   Chest pain  01/31/2014   CAD (coronary artery disease) 12/24/2013   HEARING LOSS, CONDUCTIVE, LEFT 02/23/2010   DIZZINESS, CHRONIC 05/28/2008   PULMONARY NODULE 09/07/2007   Diabetes mellitus type II, controlled ([place]) 09/06/2007   Hyperlipidemia 09/06/2007   TOBACCO ABUSE 09/06/2007   Essential hypertension 09/06/2007   Allergic rhinitis 09/06/2007   GERD 09/06/2007   BASAL CELL CARCINOMA, NOSE 09/06/2007   PCP:  Susy Frizzle, MD Pharmacy:   [place], [place] [address] [place] [address] Phone: [phone] Fax: [phone]  Readmission Risk Interventions Readmission Risk Prevention Plan 07/29/2021  Medication Screening Complete  Transportation Screening Complete  Some recent data might be hidden

## 2021-07-30 LAB — GLUCOSE, CAPILLARY
Glucose-Capillary: 157 mg/dL — ABNORMAL HIGH (ref 70–99)
Glucose-Capillary: 186 mg/dL — ABNORMAL HIGH (ref 70–99)
Glucose-Capillary: 211 mg/dL — ABNORMAL HIGH (ref 70–99)
Glucose-Capillary: 97 mg/dL (ref 70–99)

## 2021-07-30 MED ORDER — PREGABALIN 50 MG PO CAPS: 50.0000 mg | ORAL_CAPSULE | Freq: Two times a day (BID) | ORAL | 2 refills | Status: DC

## 2021-07-30 MED ORDER — INSULIN ASPART 100 UNIT/ML IJ SOLN: 0.0000 [IU] | Freq: Three times a day (TID) | INTRAMUSCULAR | 11 refills | Status: DC

## 2021-07-30 MED ORDER — PREGABALIN 50 MG PO CAPS
50.0000 mg | ORAL_CAPSULE | Freq: Three times a day (TID) | ORAL | Status: DC
  Administered 2021-07-30 – 2021-07-31 (×4): 50 mg via ORAL
  Filled 2021-07-30 (×4): qty 1

## 2021-07-30 MED ORDER — PANTOPRAZOLE SODIUM 40 MG PO TBEC: 40.0000 mg | DELAYED_RELEASE_TABLET | Freq: Every day | ORAL | 0 refills | Status: DC

## 2021-07-30 MED ORDER — CLONAZEPAM 0.25 MG PO TBDP: 0.2500 mg | ORAL_TABLET | Freq: Three times a day (TID) | ORAL | 0 refills | Status: DC

## 2021-07-30 MED ORDER — CLOPIDOGREL BISULFATE 75 MG PO TABS: 75.0000 mg | ORAL_TABLET | Freq: Every day | ORAL | 0 refills | Status: AC

## 2021-07-30 NOTE — Care Management Important Message (Signed)
Important Message  Patient Details  Name: Barry [name] MRN: [id number] Date of Birth: 1933-06-17   Medicare Important Message Given:  Yes     Tommy Medal 07/30/2021, 11:46 AM

## 2021-07-30 NOTE — Discharge Summary (Addendum)
Physician Discharge Summary [facility]    Patient: Barry [name]                   Admit date: 07/27/2021   DOB: 09-21-1933             Discharge date:07/30/2021/8:18 AM SWH:[id number]                          PCP: Susy Frizzle, MD  Disposition:  SNF  vs CIR   Recommendations for Outpatient Follow-up:   Follow up: Neurologist, continue aspirin Plavix, Neurologist regarding uncontrolled restless leg syndrome on this admission Lyrica was added, continue low-dose Klonopin Follow-up with cardiologist to establish and monitoring your aortic stenosis (TEE determined moderate aortic stenosis) Also need to follow-up with cardiologist regarding 30-day Holter monitor placement Small parotid mass-incidental finding, ENT follow-up in next 2 to 3 weeks  Discharge Condition: Stable   Code Status:   Code Status: Full Code  Diet recommendation: Diabetic diet   Discharge Diagnoses:    Active Problems:   CVA (cerebral vascular accident) [facility]   History of Present Illness/ Hospital Course Barry [name] Summary:   HAMED DEBELLA is a 87 y.o. male with medical history significant for CAD, COPD, type 2 diabetes, hypertension, GERD, allergic rhinitis, and protein calorie malnutrition who presented to the ED with complaints of weakness and vertigo for quite some time now.  He cannot state an exact timeframe, but states that both of his legs were weak bilaterally this morning as he was standing on his porch.  Patient is noted to have bilateral CVA likely cardioembolic in origin.  TEE pending for a.m.  PT/OT evaluation pending with likely need for SNF.     Active Problems:   CVA (cerebral vascular accident) ([place])     Left-sided hemiparesis due to bilateral CVA -Likely cardioembolic in origin, TTE with no acute findings and LVEF 60-65% -TEE 07/29/2021>> essentially within normal-exception of moderate aortic stenosis negative for any clots -With noted left-sided hemiparesis,  ongoing--mild improvement -PT/OT -status post evaluation recommending SNF versus CIR -Neurology consulted recommended dual antiplatelet treatment  - 30-day Holter monitor -Continue Plavix and aspirin as ordered -Hemoglobin A1c 6.7% and LDL 84   Right-sided parotid mass -Incidental finding which will require ENT follow-up in outpatient setting   Aortic stenosis-moderate  -Incidental finding on TEE -Recommended continue close monitoring with cardiologist as an outpatient -Continue currently recommended medication  CKD stage IIIa -Baseline creatinine 1.1-1.3 -Continue to monitor   Hypertension -BP meds were held for permissive hypertension -Resume home medications known HCTZ and  metoprolol    CAD -Continue on Plavix, aspirin and statins as ordered    COPD with ongoing tobacco abuse -Counseled on cessation -Bronchodilators as needed   Type 2 diabetes -SSI -A1c 6.7% -Resume diabetic diet, home medication of Amaryl   GERD -Continue Protonix      Code Status: Full Family Communication: Son at bedside 10/11 Disposition Plan:   Dispo: The patient is from: Home              Anticipated d/c is to: SNF VS CIR     Discharge Instructions:   Discharge Instructions     Activity as tolerated - No restrictions   Complete by: As directed    Call MD for:  difficulty breathing, headache or visual disturbances   Complete by: As directed    Call MD for:  persistant dizziness or light-headedness   Complete by:  As directed    Call MD for:  redness, tenderness, or signs of infection (pain, swelling, redness, odor or green/yellow discharge around incision site)   Complete by: As directed    Call MD for:  temperature >100.4   Complete by: As directed    Diet - low sodium heart healthy   Complete by: As directed    Discharge instructions   Complete by: As directed    Follow-up with neurology within next 2 to 3 days, continue discussion regarding duration of aspirin and Plavix  subsequent recommendation for monotherapy.. Also discussed your restless leg syndrome currently continue low-dose Klonopin and Lyrica. Continue aggressive PT OT, fall precaution   Increase activity slowly   Complete by: As directed         Medication List     STOP taking these medications    amoxicillin 500 MG tablet Commonly known as: AMOXIL   clonazePAM 0.5 MG tablet Commonly known as: KLONOPIN Replaced by: clonazePAM 0.25 MG disintegrating tablet   dapagliflozin propanediol 5 MG Tabs tablet Commonly known as: [place]   HYDROcodone-acetaminophen 5-325 MG tablet Commonly known as: NORCO/VICODIN   ibuprofen 200 MG tablet Commonly known as: ADVIL   ipratropium 0.03 % nasal spray Commonly known as: ATROVENT   NIFEdipine 60 MG 24 hr tablet Commonly known as: ADALAT CC   polyethylene glycol 17 g packet Commonly known as: MIRALAX / GLYCOLAX   ramipril 10 MG capsule Commonly known as: ALTACE   sildenafil 100 MG tablet Commonly known as: VIAGRA       TAKE these medications    aspirin EC 81 MG tablet Take 81 mg by mouth every morning.   cetirizine 10 MG tablet Commonly known as: ZYRTEC Take 1 tablet (10 mg total) by mouth daily.   clonazePAM 0.25 MG disintegrating tablet Commonly known as: KLONOPIN Take 1 tablet (0.25 mg total) by mouth 4 (four) times daily -  before meals and at bedtime. Replaces: clonazePAM 0.5 MG tablet   clopidogrel 75 MG tablet Commonly known as: PLAVIX Take 1 tablet (75 mg total) by mouth daily. Start taking on: July 31, 2021   ezetimibe 10 MG tablet Commonly known as: ZETIA TAKE 1 TABLET BY MOUTH DAILY, KEEP APPOINTMENT FOR FUTURE REFILL GENERIC EQUIVALENT FOR ZETIA   FLUoxetine 20 MG capsule Commonly known as: PROZAC TAKE (1) CAPSULE BY MOUTH EACH MORNING.   glimepiride 1 MG tablet Commonly known as: AMARYL Take 0.5 tablets (0.5 mg total) by mouth 2 (two) times daily. Take 1/2 tablet once a day What changed: additional  instructions   hydrochlorothiazide 25 MG tablet Commonly known as: HYDRODIURIL Take 1 tablet (25 mg total) by mouth daily.   insulin aspart 100 UNIT/ML injection Commonly known as: novoLOG Inject 0-9 Units into the skin 3 (three) times daily with meals.   metoprolol succinate 25 MG 24 hr tablet Commonly known as: TOPROL-XL TAKE ONE-HALF TABLET (12.5mg )  BY MOUTH DAILY   montelukast 10 MG tablet Commonly known as: SINGULAIR Take 1 tablet (10 mg total) by mouth at bedtime.   pantoprazole 40 MG tablet Commonly known as: PROTONIX Take 1 tablet (40 mg total) by mouth daily. Start taking on: July 31, 2021   pregabalin 50 MG capsule Commonly known as: LYRICA Take 1 capsule (50 mg total) by mouth 2 (two) times daily.   simvastatin 40 MG tablet Commonly known as: ZOCOR TAKE (1) TABLET BY MOUTH AT BEDTIME. What changed: See the new instructions.   vitamin B-12 1000 MCG tablet Commonly  known as: CYANOCOBALAMIN Take 1,000 mcg by mouth daily.        Allergies  Allergen Reactions   Lisinopril-Hydrochlorothiazide Swelling    ZESTRIL   Doxycycline Rash     Procedures /Studies:   MR BRAIN WO CONTRAST  Result Date: 07/27/2021 CLINICAL DATA:  Neuro deficit, stroke suspected EXAM: MRI HEAD WITHOUT CONTRAST TECHNIQUE: Multiplanar, multiecho pulse sequences of the brain and surrounding structures were obtained without intravenous contrast. COMPARISON:  Same day CT head, MRI 12/17/2018. FINDINGS: Brain: Restricted diffusion in the right posterior lentiform nucleus, right thalamus, and tail of the caudate (series 5, image 18, with additional punctate infarct lateral left thalamus/medial left lentiform nucleus (series 5, image 18). Possible punctate focus in the left lentiform nucleus (series 5, image 17), with possible ADC correlate. Confluent T2 hyperintense signal in the periventricular white matter and pons, likely the sequela of severe chronic small vessel ischemic disease. Foci  of susceptibility in the posterior left frontal lobe (series 9, images 52 and 47), likely sequela of remote hypertensive microhemorrhage. Vascular: Normal flow voids. Skull and upper cervical spine: Normal marrow signal. Sinuses/Orbits: Negative.  Status post bilateral lens replacements. Other: Fluid throughout the left mastoid air cells. IMPRESSION: Acute infarcts in the right posterior lentiform nucleus, thalamus, and tail of the caudate, with additional punctate infarct in the lateral left thalamus and possibly the left lentiform nucleus. Given multiple vascular territories, consider an embolic etiology. These results were called by telephone at the time of interpretation on 07/27/2021 at 6:02 PM to provider [facility] , who verbally acknowledged these results. Electronically Signed   By: Merilyn Baba M.D.   On: 07/27/2021 18:02   ECHOCARDIOGRAM COMPLETE  Result Date: 07/28/2021    ECHOCARDIOGRAM REPORT   Patient Name:   NIGUEL MOURE Date of Exam: 07/28/2021 Medical Rec #:  [id number]         Height:       71.0 in Accession #:    [id number]        Weight:       165.8 lb Date of Birth:  1932/12/22        BSA:          1.947 m Patient Age:    3 years          BP:           186/64 mmHg Patient Gender: M                 HR:           58 bpm. Exam Location:  [facility] Procedure: 2D Echo, Cardiac Doppler and Color Doppler Indications:    Stroke  History:        Patient has prior history of Echocardiogram examinations, most                 recent 04/09/2020. CAD, Stroke and COPD, Arrythmias:PVC,                 Signs/Symptoms:Chest Pain; Risk Factors:Hypertension, Diabetes,                 Dyslipidemia and Current Smoker. AAA.  Sonographer:    Wenda Low Referring Phys: [id number] [place]  1. Left ventricular ejection fraction, by estimation, is 60 to 65%. The left ventricle has normal function. The left ventricle has no regional wall motion abnormalities. There is mild left ventricular  hypertrophy. Left ventricular diastolic parameters are consistent with Grade I diastolic dysfunction (impaired relaxation).  2. Right ventricular systolic function is normal. The right ventricular size is normal.  3. The mitral valve is normal in structure. No evidence of mitral valve regurgitation. No evidence of mitral stenosis.  4. The aortic valve is normal in structure. There is moderate calcification of the aortic valve. There is moderate thickening of the aortic valve. Aortic valve regurgitation is not visualized. Mild to moderate aortic valve stenosis. Aortic valve area, by VTI measures 1.31 cm. Aortic valve mean gradient measures 14.0 mmHg. Aortic valve Vmax measures 2.64 m/s.  5. The inferior vena cava is normal in size with greater than 50% respiratory variability, suggesting right atrial pressure of 3 mmHg. Conclusion(s)/Recommendation(s): No intracardiac source of embolism detected on this transthoracic study. A transesophageal echocardiogram is recommended to exclude cardiac source of embolism if clinically indicated. FINDINGS  Left Ventricle: Left ventricular ejection fraction, by estimation, is 60 to 65%. The left ventricle has normal function. The left ventricle has no regional wall motion abnormalities. The left ventricular internal cavity size was normal in size. There is  mild left ventricular hypertrophy. Left ventricular diastolic parameters are consistent with Grade I diastolic dysfunction (impaired relaxation). Right Ventricle: The right ventricular size is normal. No increase in right ventricular wall thickness. Right ventricular systolic function is normal. Left Atrium: Left atrial size was normal in size. Right Atrium: Right atrial size was normal in size. Pericardium: There is no evidence of pericardial effusion. Mitral Valve: The mitral valve is normal in structure. Mild mitral annular calcification. No evidence of mitral valve regurgitation. No evidence of mitral valve stenosis. MV  peak gradient, 5.7 mmHg. The mean mitral valve gradient is 2.0 mmHg. Tricuspid Valve: The tricuspid valve is normal in structure. Tricuspid valve regurgitation is not demonstrated. No evidence of tricuspid stenosis. Aortic Valve: The aortic valve is normal in structure. There is moderate calcification of the aortic valve. There is moderate thickening of the aortic valve. Aortic valve regurgitation is not visualized. Mild to moderate aortic stenosis is present. Aortic valve mean gradient measures 14.0 mmHg. Aortic valve peak gradient measures 27.9 mmHg. Aortic valve area, by VTI measures 1.31 cm. Pulmonic Valve: The pulmonic valve was normal in structure. Pulmonic valve regurgitation is not visualized. No evidence of pulmonic stenosis. Aorta: The aortic root is normal in size and structure. Venous: The inferior vena cava is normal in size with greater than 50% respiratory variability, suggesting right atrial pressure of 3 mmHg. IAS/Shunts: No atrial level shunt detected by color flow Doppler.  LEFT VENTRICLE PLAX 2D LVIDd:         4.00 cm      Diastology LVIDs:         2.80 cm      LV e' medial:    6.85 cm/s LV PW:         1.40 cm      LV E/e' medial:  9.7 LV IVS:        1.30 cm      LV e' lateral:   7.94 cm/s LVOT diam:     2.00 cm      LV E/e' lateral: 8.4 LV SV:         79 LV SV Index:   41 LVOT Area:     3.14 cm  LV Volumes (MOD) LV vol d, MOD A2C: 99.3 ml LV vol d, MOD A4C: 121.0 ml LV vol s, MOD A2C: 53.2 ml LV vol s, MOD A4C: 57.2 ml LV SV MOD A2C:     46.1  ml LV SV MOD A4C:     121.0 ml LV SV MOD BP:      56.8 ml RIGHT VENTRICLE RV Basal diam:  3.50 cm RV Mid diam:    3.00 cm RV S prime:     14.70 cm/s TAPSE (M-mode): 2.6 cm LEFT ATRIUM             Index        RIGHT ATRIUM           Index LA diam:        3.30 cm 1.69 cm/m   RA Area:     19.50 cm LA Vol (A2C):   70.3 ml 36.11 ml/m  RA Volume:   52.90 ml  27.17 ml/m LA Vol (A4C):   46.2 ml 23.73 ml/m LA Biplane Vol: 58.8 ml 30.20 ml/m  AORTIC VALVE                      PULMONIC VALVE AV Area (Vmax):    1.16 cm      PV Vmax:       1.05 m/s AV Area (Vmean):   1.08 cm      PV Peak grad:  4.4 mmHg AV Area (VTI):     1.31 cm AV Vmax:           264.25 cm/s AV Vmean:          175.250 cm/s AV VTI:            0.608 m AV Peak Grad:      27.9 mmHg AV Mean Grad:      14.0 mmHg LVOT Vmax:         97.90 cm/s LVOT Vmean:        60.100 cm/s LVOT VTI:          0.253 m LVOT/AV VTI ratio: 0.42  AORTA Ao Root diam: 3.00 cm MITRAL VALVE MV Area (PHT): 1.72 cm     SHUNTS MV Area VTI:   2.03 cm     Systemic VTI:  0.25 m MV Peak grad:  5.7 mmHg     Systemic Diam: 2.00 cm MV Mean grad:  2.0 mmHg MV Vmax:       1.19 m/s MV Vmean:      57.0 cm/s MV Decel Time: 441 msec MV E velocity: 66.40 cm/s MV A velocity: 108.00 cm/s MV E/A ratio:  0.61 Candee Furbish MD Electronically signed by Candee Furbish MD Signature Date/Time: 07/28/2021/2:58:35 PM    Final    ECHO TEE  Result Date: 07/29/2021    TRANSESOPHOGEAL ECHO REPORT   Patient Name:   DRAXTON LUU [facility] Date of Exam: 07/29/2021 Medical Rec #:  [id number]         Height:       71.0 in Accession #:    [id number]        Weight:       165.8 lb Date of Birth:  Jul 12, 1933        BSA:          1.947 m Patient Age:    87 years          BP:           220/63 mmHg Patient Gender: M                 HR:           70 bpm. Exam Location:  [facility] Procedure: Transesophageal Echo, Cardiac Doppler  and Color Doppler Indications:    Stroke  History:        Patient has prior history of Echocardiogram examinations, most                 recent 07/28/2021. CAD, COPD, Arrythmias:PVC; Risk                 Factors:Hypertension, Diabetes, Dyslipidemia and Current Smoker.  Sonographer:    Alvino Chapel RCS Referring Phys: [phone] [place]: After discussion of the risks and benefits of a TEE, an informed consent was obtained from the patient. TEE procedure time was 15 minutes. The transesophogeal probe was passed without difficulty through the esophogus  of the patient. Sedation performed by different physician. The patient was monitored while under deep sedation. Anesthestetic sedation was provided intravenously by Anesthesiology: 250mg  of Propofol, 50mg  of Lidocaine. Image quality was excellent. The patient's vital signs; including heart rate, blood pressure, and oxygen saturation; remained stable throughout the procedure. The patient developed no complications during the procedure. IMPRESSIONS  1. Left ventricular ejection fraction, by estimation, is 60 to 65%. The left ventricle has normal function.  2. Right ventricular systolic function is normal. The right ventricular size is normal.  3. No left atrial/left atrial appendage thrombus was detected. The LAA emptying velocity was 90 cm/s.  4. The mitral valve is grossly normal. Trivial mitral valve regurgitation. No evidence of mitral stenosis.  5. The aortic valve is tricuspid. There is moderate calcification of the aortic valve. There is moderate thickening of the aortic valve. Aortic valve regurgitation is not visualized. Moderate aortic valve stenosis. Aortic valve area, by VTI measures 1.31 cm. Aortic valve mean gradient measures 23.3 mmHg. Aortic valve Vmax measures 3.10 m/s.  6. There is Moderate (Grade III) layered plaque involving the descending aorta and transverse aorta.  7. Agitated saline contrast bubble study was negative, with no evidence of any interatrial shunt. Conclusion(s)/Recommendation(s): No LA/LAA thrombus identified. Negative bubble study for interatrial shunt. No intracardiac source of embolism detected on this on this transesophageal echocardiogram. FINDINGS  Left Ventricle: Left ventricular ejection fraction, by estimation, is 60 to 65%. The left ventricle has normal function. The left ventricular internal cavity size was normal in size. Right Ventricle: The right ventricular size is normal. No increase in right ventricular wall thickness. Right ventricular systolic function is  normal. Left Atrium: Left atrial size was normal in size. No left atrial/left atrial appendage thrombus was detected. The LAA emptying velocity was 90 cm/s. Right Atrium: Right atrial size was normal in size. Prominent Eustachian valve. Pericardium: Trivial pericardial effusion is present. Mitral Valve: The mitral valve is grossly normal. Trivial mitral valve regurgitation. No evidence of mitral valve stenosis. Tricuspid Valve: The tricuspid valve is grossly normal. Tricuspid valve regurgitation is mild . No evidence of tricuspid stenosis. Aortic Valve: The aortic valve is tricuspid. There is moderate calcification of the aortic valve. There is moderate thickening of the aortic valve. Aortic valve regurgitation is not visualized. Moderate aortic stenosis is present. Aortic valve mean gradient measures 23.3 mmHg. Aortic valve peak gradient measures 38.5 mmHg. Aortic valve area, by VTI measures 1.31 cm. Pulmonic Valve: The pulmonic valve was grossly normal. Pulmonic valve regurgitation is not visualized. No evidence of pulmonic stenosis. Aorta: The aortic root and ascending aorta are structurally normal, with no evidence of dilitation. There is moderate (Grade III) layered plaque involving the descending aorta and transverse aorta. Venous: The left upper pulmonary vein, left lower pulmonary vein, right lower  pulmonary vein and right upper pulmonary vein are normal. IAS/Shunts: No atrial level shunt detected by color flow Doppler. Agitated saline contrast was given intravenously to evaluate for intracardiac shunting. Agitated saline contrast bubble study was negative, with no evidence of any interatrial shunt.  LEFT VENTRICLE PLAX 2D LVOT diam:     2.20 cm LV SV:         86 LV SV Index:   44 LVOT Area:     3.80 cm  AORTIC VALVE AV Area (Vmax):    1.53 cm AV Area (Vmean):   1.52 cm AV Area (VTI):     1.31 cm AV Vmax:           310.36 cm/s AV Vmean:          231.530 cm/s AV VTI:            0.660 m AV Peak Grad:       38.5 mmHg AV Mean Grad:      23.3 mmHg LVOT Vmax:         124.89 cm/s LVOT Vmean:        92.445 cm/s LVOT VTI:          0.227 m LVOT/AV VTI ratio: 0.34  AORTA Ao Root diam: 3.48 cm Ao Asc diam:  3.36 cm TRICUSPID VALVE TR Peak grad:   5.8 mmHg TR Vmax:        120.00 cm/s  SHUNTS Systemic VTI:  0.23 m Systemic Diam: 2.20 cm Eleonore Chiquito MD Electronically signed by Eleonore Chiquito MD Signature Date/Time: 07/29/2021/4:27:23 PM    Final    CT HEAD CODE STROKE WO CONTRAST  Result Date: 07/27/2021 CLINICAL DATA:  Code stroke. Left arm weakness starting at 9:30 this morning EXAM: CT HEAD WITHOUT CONTRAST TECHNIQUE: Contiguous axial images were obtained from the base of the skull through the vertex without intravenous contrast. COMPARISON:  CT head 10/27/2018, brain MRI 03/13/2021 FINDINGS: Brain: There is no evidence of acute intracranial hemorrhage, extra-axial fluid collection, or acute infarct. There is mild parenchymal volume loss. Confluent hypodensity throughout the subcortical and periventricular white matter likely reflects sequela of advanced chronic white matter microangiopathy. The ventricles are stable in size. There is no mass lesion.  There is no midline shift. Vascular: There is calcification of the bilateral cavernous ICAs and vertebral arteries. There is no dense vessel. Skull: Normal. Negative for fracture or focal lesion. Sinuses/Orbits: The paranasal sinuses are clear. Bilateral lens implants are in place. The globes and orbits are otherwise unremarkable. Other: A left mastoid effusion is again seen. ASPECTS [facility] Stroke Program Early CT Score) - Ganglionic level infarction (caudate, lentiform nuclei, internal capsule, insula, M1-M3 cortex): 7 - Supraganglionic infarction (M4-M6 cortex): 3 Total score (0-10 with 10 being normal): 10 IMPRESSION: 1. No acute intracranial pathology. 2. ASPECTS is 10 3. Mild parenchymal volume loss and advanced chronic white matter microangiopathy. These results were  called by telephone at the time of interpretation on 07/27/2021 at 12:27 pm to provider [facility] , who verbally acknowledged these results. Electronically Signed   By: Valetta Mole M.D.   On: 07/27/2021 12:27   CT ANGIO HEAD NECK W WO CM (CODE STROKE)  Result Date: 07/27/2021 CLINICAL DATA:  Left-sided weakness EXAM: CT ANGIOGRAPHY HEAD AND NECK TECHNIQUE: Multidetector CT imaging of the head and neck was performed using the standard protocol during bolus administration of intravenous contrast. Multiplanar CT image reconstructions and MIPs were obtained to evaluate the vascular anatomy. Carotid stenosis measurements (when applicable)  are obtained utilizing NASCET criteria, using the distal internal carotid diameter as the denominator. CONTRAST:  48mL OMNIPAQUE IOHEXOL 350 MG/ML SOLN COMPARISON:  Same-day noncontrast CT head, MRA head/neck 03/13/2021 FINDINGS: CTA NECK FINDINGS Aortic arch: There is calcified atherosclerotic plaque of the aortic arch and in the proximal great vessels. There is multifocal irregularity of the right subclavian artery without high-grade stenosis or occlusion. There is no evidence of dissection or aneurysm. Right carotid system: There is scattered calcified atherosclerotic plaque in the right common carotid artery without hemodynamically significant stenosis. There is soft and calcified atherosclerotic plaque of the proximal right internal carotid artery resulting in up to 30-40% stenosis. There is no evidence of dissection or aneurysm. Left carotid system: There is mild calcified atherosclerotic plaque in the left common carotid artery without hemodynamically significant stenosis. There is mild calcified atherosclerotic plaque in the proximal left internal carotid artery without hemodynamically significant stenosis or occlusion. There is no dissection or aneurysm. Vertebral arteries: There is mild calcified atherosclerotic plaque in the proximal right vertebral artery and at  the V2/V3 junction without hemodynamically significant stenosis or occlusion. The remainder of the right vertebral artery is patent. There is no dissection or aneurysm. The left vertebral artery is2 smaller than the right, a normal variant. There is no hemodynamically significant stenosis or occlusion. There is no dissection or aneurysm. Skeleton: There is multilevel degenerative change of the cervical spine, most advanced at C5-C6. Other neck: There is a subcentimeter left thyroid nodule. Small bilateral palatine tonsilliths are noted. There is a 1.0 cm enhancing lesion in the right parotid gland. Upper chest: There is emphysema in the lung apices. There is cavitation in the medial left upper lobe, incompletely imaged but also present on the prior CT chest from 12/21/2018. Review of the MIP images confirms the above findings CTA HEAD FINDINGS Anterior circulation: There is calcified atherosclerotic plaque of the bilateral intracranial carotid arteries resulting in mild to moderate stenosis on the right and mild stenosis on the left, similar to the prior study. There is no evidence of occlusion. The bilateral MCAs are patent. The bilateral ACAs are patent. There is no aneurysm. Posterior circulation: There is mild calcified atherosclerotic plaque of the right V4 segment without hemodynamically significant stenosis or occlusion. The left V4 segment is patent. PICA is patent bilaterally. There is mild focal stenosis of the the proximal left P2 segment (8-117), similar to the prior study. Basilar artery is patent. The bilateral PCAs are patent. There is no aneurysm. Venous sinuses: Patent. Anatomic variants: None. Review of the MIP images confirms the above findings IMPRESSION: 1. Mild atherosclerotic plaque in the bilateral carotid bulbs resulting in up to 30-40% stenosis on the right. Patent vertebral arteries. 2. Calcified atherosclerotic plaque of the bilateral intracranial ICAs resulting in mild to moderate  stenosis on the right and mild stenosis on the left, not significantly changed. 3. Mild focal stenosis of the proximal left P2 segment. Otherwise, patent intracranial vasculature. 4. 1.0 cm enhancing lesion in the right parotid gland is indeterminate with differential including both benign and malignant etiologies. Recommend ENT referral. Aortic Atherosclerosis (ICD10-I70.0) and Emphysema (ICD10-J43.9). Electronically Signed   By: Valetta Mole M.D.   On: 07/27/2021 13:19    Subjective:   Patient was seen and examined 07/30/2021, 8:18 AM Patient stable today. No acute distress.  No issues overnight Stable for discharge.... Continues to have left-sided weakness with no progressive changes  Discharge Exam:    Vitals:   07/30/21 0219 07/30/21 0402 07/30/21  0612 07/30/21 0643  BP: (!) 167/74 (!) 183/77 (!) 194/83 (!) 161/67  Pulse: 72 75 70 66  Resp:  20    Temp:  98 F (36.7 C)    TempSrc:      SpO2:  96%    Weight:      Height:        General: Pt lying comfortably in bed & appears in no obvious distress. Cardiovascular: S1 & S2 heard, RRR, S1/S2 +. No murmurs, rubs, gallops or clicks. No JVD or pedal edema. Respiratory: Clear to auscultation without wheezing, rhonchi or crackles. No increased work of breathing. Abdominal:  Non-distended, non-tender & soft. No organomegaly or masses appreciated. Normal bowel sounds heard. CNS: Alert and oriented. No focal deficits.  Left-sided weakness, speech intact, cognition intact Extremities: no edema, no cyanosis      The results of significant diagnostics from this hospitalization (including imaging, microbiology, ancillary and laboratory) are listed below for reference.      Microbiology:   Recent Results (from the past 240 hour(s))  Resp Panel by RT-PCR (Flu A&B, Covid) Nasopharyngeal Swab     Status: None   Collection Time: 07/27/21  2:04 PM   Specimen: Nasopharyngeal Swab; Nasopharyngeal(NP) swabs in vial transport medium  Result  Value Ref Range Status   SARS Coronavirus 2 by RT PCR NEGATIVE NEGATIVE Final    Comment: (NOTE) SARS-CoV-2 target nucleic acids are NOT DETECTED.  The SARS-CoV-2 RNA is generally detectable in upper respiratory specimens during the acute phase of infection. The lowest concentration of SARS-CoV-2 viral copies this assay can detect is 138 copies/mL. A negative result does not preclude SARS-Cov-2 infection and should not be used as the sole basis for treatment or other patient management decisions. A negative result may occur with  improper specimen collection/handling, submission of specimen other than nasopharyngeal swab, presence of viral mutation(s) within the areas targeted by this assay, and inadequate number of viral copies(<138 copies/mL). A negative result must be combined with clinical observations, patient history, and epidemiological information. The expected result is Negative.  Fact Sheet for Patients:  EntrepreneurPulse.com.au  Fact Sheet for Healthcare Providers:  IncredibleEmployment.be  This test is no t yet approved or cleared by the [place] FDA and  has been authorized for detection and/or diagnosis of SARS-CoV-2 by FDA under an Emergency Use Authorization (EUA). This EUA will remain  in effect (meaning this test can be used) for the duration of the COVID-19 declaration under Section 564(b)(1) of the Act, 21 U.S.C.section 360bbb-3(b)(1), unless the authorization is terminated  or revoked sooner.       Influenza A by PCR NEGATIVE NEGATIVE Final   Influenza B by PCR NEGATIVE NEGATIVE Final    Comment: (NOTE) The Xpert Xpress SARS-CoV-2/FLU/RSV plus assay is intended as an aid in the diagnosis of influenza from Nasopharyngeal swab specimens and should not be used as a sole basis for treatment. Nasal washings and aspirates are unacceptable for Xpert Xpress SARS-CoV-2/FLU/RSV testing.  Fact Sheet for  Patients: EntrepreneurPulse.com.au  Fact Sheet for Healthcare Providers: IncredibleEmployment.be  This test is not yet approved or cleared by the [place] FDA and has been authorized for detection and/or diagnosis of SARS-CoV-2 by FDA under an Emergency Use Authorization (EUA). This EUA will remain in effect (meaning this test can be used) for the duration of the COVID-19 declaration under Section 564(b)(1) of the Act, 21 U.S.C. section 360bbb-3(b)(1), unless the authorization is terminated or revoked.  Performed at [facility], [address],  [place], [place] [address]      Labs:   CBC: Recent Labs  Lab 07/27/21 1231 07/27/21 1232 07/28/21 0548 07/29/21 0521  WBC 9.2  --  7.1 6.6  NEUTROABS 6.9  --   --   --   HGB 15.8 16.0 14.5 14.2  HCT 46.4 47.0 45.0 42.9  MCV 96.7  --  96.6 97.7  PLT 210  --  193 604   Basic Metabolic Panel: Recent Labs  Lab 07/27/21 1231 07/27/21 1232 07/28/21 0548 07/29/21 0521  NA 136 138 139 138  K 3.7 3.7 3.2* 3.9  CL 100 100 103 103  CO2 27  --  28 29  GLUCOSE 174* 173* 148* 161*  BUN 24* 22 22 25*  CREATININE 1.36* 1.30* 1.13 1.24  CALCIUM 9.4  --  9.0 8.8*  MG  --   --  1.9 2.0   Liver Function Tests: Recent Labs  Lab 07/27/21 1231  AST 25  ALT 17  ALKPHOS 49  BILITOT 0.5  PROT 7.9  ALBUMIN 4.4   BNP (last 3 results) No results for input(s): BNP in the last 8760 hours. Cardiac Enzymes: No results for input(s): CKTOTAL, CKMB, CKMBINDEX, TROPONINI in the last 168 hours. CBG: Recent Labs  Lab 07/29/21 1134 07/29/21 1327 07/29/21 1610 07/29/21 2018 07/30/21 0722  GLUCAP 91 101* 118* 146* 157*   Hgb A1c Recent Labs    07/27/21 1231  HGBA1C 6.7*   Lipid Profile Recent Labs    07/27/21 1231  CHOL 164  HDL 64  LDLCALC 84  TRIG 81  CHOLHDL 2.6   Thyroid function studies No results for input(s): TSH, T4TOTAL, T3FREE, THYROIDAB in the last 72 hours.  Invalid  input(s): FREET3 Anemia work up No results for input(s): VITAMINB12, FOLATE, FERRITIN, TIBC, IRON, RETICCTPCT in the last 72 hours. Urinalysis    Component Value Date/Time   COLORURINE STRAW (A) 07/27/2021 1300   APPEARANCEUR CLEAR 07/27/2021 1300   LABSPEC 1.012 07/27/2021 1300   PHURINE 7.0 07/27/2021 1300   GLUCOSEU NEGATIVE 07/27/2021 1300   HGBUR SMALL (A) 07/27/2021 1300   BILIRUBINUR NEGATIVE 07/27/2021 1300   KETONESUR NEGATIVE 07/27/2021 1300   PROTEINUR 100 (A) 07/27/2021 1300   UROBILINOGEN 0.2 09/12/2009 1019   NITRITE NEGATIVE 07/27/2021 1300   LEUKOCYTESUR NEGATIVE 07/27/2021 1300         Time coordinating discharge: Over 45 minutes  SIGNED: Deatra James, MD, FACP, FHM. [facility],  Please use [email or web address] to Page If 7PM-7AM, please contact night-coverage [email or web address] 07/30/2021, 8:18 AM

## 2021-07-30 NOTE — PMR Pre-admission (Signed)
PMR Admission Coordinator Pre-Admission Assessment  Patient: Barry [name] is an 87 y.o., male MRN: [id number] DOB: [birth year] Height: 5\' 11"  (180.3 cm) Weight: 75.2 kg  Insurance Information HMO:     PPO:      PCP:      IPA:      80/20:      OTHER:  PRIMARY: Medicare a and b      Policy#: [id number]      Subscriber: pt Benefits:  Phone passport once source     Name: 10/13 Eff. Date: a and b 07/18/2001     Deduct: $1556      Out of Pocket Max: none      Life Max: none CIR: 100%      SNF: 20 full days Outpatient: 80%     Co-Pay: 20% Home Health: 100%      Co-Pay: none DME: 80%     Co-Pay: 205 Providers: pt choice  SECONDARY: [employer]      Policy#: [id number]  Financial Counselor:       Phone#:   The "Data Collection Information Summary" for patients in Inpatient Rehabilitation Facilities with attached "Privacy Act [place] Records" was provided and verbally reviewed with: Family  Emergency Contact Information Contact Information     Name Relation Home Work Mobile   [place] Son [phone]        Current Medical History  Patient Admitting Diagnosis: CVA  History of Present Illness: 87 year old male with medical history of CAD, COPD, type 2 DM, HTN, GERD, allergic rhinitis and protein calore malnutrition. Presented on 07/27/2021 with complaints of weakness and vertigo. He fell on his porch and noted legs were weak bilaterally. He recently had MRA of the head and neck performed by his PCP to evaluate for posterior circulation insufficiency which was negative.   MRI shows multiple small infarcts involving the posterior limb of th internal capsul, thalamic mucus and centrum semi ovale/corona radiata on the right side. There is also a couple seen on the left side involving the basal ganglia. Remote hemorrhages noted involving the left frontal area. Also remote infarct involving the inferior left cerebellum and basal ganglia. Felt cardioembolic in origin.  TTE with o acute findings and LVEF 60 - 65%. TEE 10/12 essentially within normal with exception of moderate aortic stenosis negative for clots. Neurology recommended dual antiplatelet treatment. 30 day Holter monitor. Hgb A1c 6.7% and LDL 84.   Right sided parotid mass incidental fining with will require ENT follow up as an outpatient. Aortic stenosis moderate finding on TEE. Recommend close monitoring with cardiologist as an outpatient. CKD stage IIIa baseline creatinine 1.1-3. HTN and allowing for permissive HTN. BP remained elevated so home meds of HCTZ and metoprolol changed to Norvasc and Toprol XL. COPD with ongoing tobacco abuse. Counseled on cessation and bronchodilators as needed. Type 2 Diabetes with Hgb A1c 6.7 %, resume diabetic diet, home med of Amaryl.  Complete NIHSS TOTAL: 7  Patient[facility] has been reviewed by the rehabilitation admission coordinator and physician.  Past Medical History  Past Medical History:  Diagnosis Date   AAA (abdominal aortic aneurysm) 3-09   4.7 cm   Allergy    rhinitis   Anxiety    Cancer ([place])    skin   Cataract    Depression    Diabetes mellitus    Diverticulosis    GERD (gastroesophageal reflux disease)    Hepatitis    Hx of colonic  polyps 08/26/2015   Hyperlipidemia    Hypertension    Nephrolithiasis    Personal history of colonic polyps    adenomas, serrated also   PVD (peripheral vascular disease) ([place])    Tobacco abuse    Has the patient had major surgery during 100 days prior to admission? No  Family History   family history includes Cancer in his brother and brother; Depression in his brother and sister; Diabetes in his sister; Emphysema in his father; Heart disease in his mother; Kidney disease in his sister; Learning disabilities in his sister; Lung disease in his sister; Mental retardation in his sister.  Current Medications  Current Facility-Administered Medications:    acetaminophen  (TYLENOL) tablet 650 mg, 650 mg, Oral, Q6H PRN, 650 mg at 07/31/21 0105 **OR** acetaminophen (TYLENOL) suppository 650 mg, 650 mg, Rectal, Q6H PRN, O'Neal, Cassie Freer, MD   amLODipine (NORVASC) tablet 5 mg, 5 mg, Oral, Daily, Shahmehdi, Seyed A, MD, 5 mg at 07/31/21 0827   aspirin EC tablet 81 mg, 81 mg, Oral, q morning, O'Neal, Cassie Freer, MD, 81 mg at 07/31/21 0827   clonazePAM (KLONOPIN) disintegrating tablet 0.25 mg, 0.25 mg, Oral, TID AC & HS, O'Neal, Cassie Freer, MD, 0.25 mg at 07/31/21 6294   [COMPLETED] clopidogrel (PLAVIX) tablet 300 mg, 300 mg, Oral, Once, 300 mg at 07/27/21 1310 **AND** clopidogrel (PLAVIX) tablet 75 mg, 75 mg, Oral, Daily, O'Neal, Cassie Freer, MD, 75 mg at 07/31/21 0827   ezetimibe (ZETIA) tablet 10 mg, 10 mg, Oral, Daily, O'Neal, Cassie Freer, MD, 10 mg at 07/31/21 0827   FLUoxetine (PROZAC) capsule 20 mg, 20 mg, Oral, Daily, O'Neal, Cassie Freer, MD, 20 mg at 07/31/21 0827   heparin injection 5,000 Units, 5,000 Units, Subcutaneous, Q8H, O'Neal, Cassie Freer, MD, 5,000 Units at 07/31/21 0659   hydrALAZINE (APRESOLINE) injection 10 mg, 10 mg, Intravenous, Q6H PRN, Audie Box, Cassie Freer, MD, 10 mg at 07/30/21 7654   HYDROcodone-acetaminophen (NORCO/VICODIN) 5-325 MG per tablet 1 tablet, 1 tablet, Oral, Q6H PRN, O'Neal, Cassie Freer, MD, 1 tablet at 07/30/21 0416   influenza vaccine adjuvanted (FLUAD) injection 0.5 mL, 0.5 mL, Intramuscular, Tomorrow-1000, Shah, Pratik D, DO   insulin aspart (novoLOG) injection 0-5 Units, 0-5 Units, Subcutaneous, QHS, O'Neal, Cassie Freer, MD   insulin aspart (novoLOG) injection 0-9 Units, 0-9 Units, Subcutaneous, TID WC, O'Neal, Cassie Freer, MD, 2 Units at 07/31/21 0828   ipratropium-albuterol (DUONEB) 0.5-2.5 (3) MG/3ML nebulizer solution 3 mL, 3 mL, Nebulization, Q6H PRN, O'Neal, Cassie Freer, MD   loratadine (CLARITIN) tablet 10 mg, 10 mg, Oral, Daily, O'Neal, Cassie Freer, MD, 10 mg at 07/31/21 0827   metoprolol  succinate (TOPROL-XL) 24 hr tablet 25 mg, 25 mg, Oral, Daily, Shahmehdi, Seyed A, MD, 25 mg at 07/31/21 0827   montelukast (SINGULAIR) tablet 10 mg, 10 mg, Oral, QHS, O'Neal, Cassie Freer, MD, 10 mg at 07/30/21 2220   ondansetron (ZOFRAN) tablet 4 mg, 4 mg, Oral, Q6H PRN **OR** ondansetron (ZOFRAN) injection 4 mg, 4 mg, Intravenous, Q6H PRN, O'Neal, Cassie Freer, MD   pantoprazole (PROTONIX) EC tablet 40 mg, 40 mg, Oral, Daily, O'Neal, Cassie Freer, MD, 40 mg at 07/31/21 0827   pneumococcal 23 valent vaccine (PNEUMOVAX-23) injection 0.5 mL, 0.5 mL, Intramuscular, Tomorrow-1000, Shah, Pratik D, DO   pregabalin (LYRICA) capsule 50 mg, 50 mg, Oral, TID, Shahmehdi, Seyed A, MD, 50 mg at 07/31/21 0827   simvastatin (ZOCOR) tablet 40 mg, 40 mg, Oral, QHS, O'Neal, Cassie Freer, MD, 40 mg at 07/30/21 2220  vitamin B-12 (CYANOCOBALAMIN) tablet 1,000 mcg, 1,000 mcg, Oral, Daily, O'Neal, Cassie Freer, MD, 1,000 mcg at 07/31/21 5732  Patients Current Diet:  Diet Order             Diet - low sodium heart healthy           Diet regular Room service appropriate? Yes; Fluid consistency: Thin  Diet effective now                  Precautions / Restrictions Precautions Precautions: Fall Restrictions Weight Bearing Restrictions: No   Has the patient had 2 or more falls or a fall with injury in the past year? Yes  Prior Activity Level Community (5-7x/wk): Independent and driving, living alone  Prior Functional Level Self Care: Did the patient need help bathing, dressing, using the toilet or eating? Independent  Indoor Mobility: Did the patient need assistance with walking from room to room (with or without device)? Independent  Stairs: Did the patient need assistance with internal or external stairs (with or without device)? Independent  Functional Cognition: Did the patient need help planning regular tasks such as shopping or remembering to take medications? Independent  Patient  Information Are you of Hispanic, Latino/a,or Spanish origin?: A. No, not of Hispanic, Latino/a, or Spanish origin What is your race?: A. White Do you need or want an interpreter to communicate with a doctor or health care staff?: 0. No  Patient's Response To:  Health Literacy and Transportation Is the patient able to respond to health literacy and transportation needs?: Yes Health Literacy - How often do you need to have someone help you when you read instructions, pamphlets, or other written material from your doctor or pharmacy?: Never In the past 12 months, has lack of transportation kept you from medical appointments or from getting medications?: No In the past 12 months, has lack of transportation kept you from meetings, work, or from getting things needed for daily living?: No  Development worker, international aid / [place] Devices/Equipment: None Home Equipment: Environmental consultant - 2 wheels, Cecilia - single point, Wheelchair - manual  Prior Device Use: Indicate devices/aids used by the patient prior to current illness, exacerbation or injury? None of the above  Current Functional Level Cognition  Overall Cognitive Status: Within Functional Limits for tasks assessed Orientation Level: Oriented X4    Extremity Assessment (includes Sensation/Coordination)  Upper Extremity Assessment: Defer to OT evaluation LUE Deficits / Details: 3/5 shoulder MMT; elbow 4/5 flexion, 4/5 grip; 4-/5 wrist flexion, 4+/ wrist extension. Slow pace for sequential finger touching. Difficulty with proprioceptive task of touching nose with eyes closed. LUE Sensation: decreased proprioception LUE Coordination: decreased fine motor, decreased gross motor  Lower Extremity Assessment: Generalized weakness, LLE deficits/detail LLE Deficits / Details: grossly 3+/5 except anke dorsiflexion 0/5 LLE Sensation: WNL LLE Coordination: decreased gross motor    ADLs  Overall ADL's : Needs assistance/impaired Eating/Feeding:  Sitting, Minimal assistance Grooming: Sitting, Minimal assistance, Moderate assistance Upper Body Bathing: Minimal assistance, Sitting, Min guard Lower Body Bathing: Moderate assistance, Maximal assistance, Sitting/lateral leans Upper Body Dressing : Min guard, Minimal assistance, Sitting Lower Body Dressing: Moderate assistance, Maximal assistance, Sitting/lateral leans Toilet Transfer: Maximal assistance, RW, Stand-pivot Toilet Transfer Details (indicate cue type and reason): Simulated via EOB to chair transfer with RW Toileting- Clothing Manipulation and Hygiene: Maximal assistance, Sitting/lateral lean Tub/ Shower Transfer: Maximal assistance, Stand-pivot, Rolling walker, Tub bench Functional mobility during ADLs: Maximal assistance, Rolling walker General ADL Comments: All levels taken per clinical  judgment based on bed mobility, EOB to chair transfer, and UE assessment.    Mobility  Overal bed mobility: Needs Assistance Bed Mobility: Supine to Sit Supine to sit: Mod assist, Max assist General bed mobility comments: as per OT notes    Transfers  Overall transfer level: Needs assistance Equipment used: Rolling walker (2 wheeled) Transfers: Stand Pivot Transfers, Sit to/from Stand Sit to Stand: Max assist Stand pivot transfers: Max assist General transfer comment: as per OT notes    Ambulation / Gait / Stairs / Wheelchair Mobility  Ambulation/Gait Ambulation/Gait assistance: Max Web designer (Feet): 4 Feet Assistive device: Rolling walker (2 wheeled) Gait Pattern/deviations: Decreased step length - right, Decreased step length - left, Decreased stance time - left, Decreased stride length, Decreased dorsiflexion - left, Shuffle General Gait Details: limited to a few slow labored side steps with frequent buckling of left knee, poor coordination, required Max tactile assistance to shuffle LLE when attempting steps Gait velocity: slow    Posture / Balance Dynamic Sitting  Balance Sitting balance - Comments: sitting at EOB Balance Overall balance assessment: Needs assistance Sitting-balance support: Feet supported, No upper extremity supported Sitting balance-Leahy Scale: Poor Sitting balance - Comments: sitting at EOB Postural control: Left lateral lean Standing balance support: During functional activity, Bilateral upper extremity supported Standing balance-Leahy Scale: Poor Standing balance comment: using RW    Special needs/care consideration Hgb A1c 6.7 % Will need 30 day Holter monitor Zio14 day placed on 10/14   Previous Home Environment  Living Arrangements: Alone  Lives With: Alone Available Help at Discharge: Family, Available 24 hours/day (son will hire caregivers when he works) Type of Home: Mobile home Home Layout: One level Home Access: Stairs to enter Entrance Stairs-Rails: Right, Left, Can reach both Entrance Stairs-Number of Steps: 3 Bathroom Shower/Tub: Optometrist: Yes Home Care Services: No Additional Comments: son and dtr in law live in [place] and work so they will hire 24/7 assist when they work  Discharge Living Setting Plans for Discharge Living Setting: Lives with (comment) (to stay with son and his wife in [place]) Type of Home at Discharge: House Discharge Home Layout: Two level, 1/2 bath on main level, Other (Comment), Bed/bath upstairs (son to set up room for bedroom downstairs) Alternate Level Stairs-Number of Steps: 15 Discharge Home Access: Stairs to enter Entrance Stairs-Rails: None Entrance Stairs-Number of Steps: 3 to 4 Discharge Bathroom Shower/Tub: Walk-in shower, Tub/shower unit (full baths are upstairs; may get stairlift for upper level access) Discharge Bathroom Toilet: Standard Discharge Bathroom Accessibility: Yes How Accessible: Accessible via walker Does the patient have any problems obtaining your medications?: No  Son's home address [address] [place], [place]  Social/Family/Support Systems Patient Roles: Parent Contact Information: Writer Anticipated Caregiver: son, daughter in Sports coach and hired caregivers Anticipated Caregiver's Contact Information: [phone] Ability/Limitations of Caregiver: son and his wife work 8 am until 5; will hire caregivers Caregiver Availability: 24/7 Discharge Plan Discussed with Primary Caregiver: Yes Is Caregiver In Agreement with Plan?: Yes Does Caregiver/Family have Issues with Lodging/Transportation while Pt is in Rehab?: No  Goals Patient/Family Goal for Rehab: supervision to min asisst with PT, and OT, supervision with SLP Expected length of stay: ELOS 10 to 14 days Pt/Family Agrees to Admission and willing to participate: Yes Program Orientation Provided & Reviewed with Pt/Caregiver Including Roles  & Responsibilities: Yes  Decrease burden of Care through IP rehab admission: n/a  Possible need for SNF placement upon  discharge: not anticipated  Patient Condition: I have reviewed medical records from [facility] , spoken with CM, and patient, son, and family member. I discussed via phone for inpatient rehabilitation assessment.  Patient will benefit from ongoing PT, OT, and SLP, can actively participate in 3 hours of therapy a day 5 days of the week, and can make measurable gains during the admission.  Patient will also benefit from the coordinated team approach during an Inpatient Acute Rehabilitation admission.  The patient will receive intensive therapy as well as Rehabilitation physician, nursing, social worker, and care management interventions.  Due to bladder management, bowel management, safety, skin/wound care, disease management, medication administration, pain management, and patient education the patient requires 24 hour a day rehabilitation nursing.  The patient is currently mod to max assist with mobility and basic ADLs.  Discharge setting and therapy post discharge at  home with home health is anticipated.  Patient has agreed to participate in the Acute Inpatient Rehabilitation Program and will admit today.  Preadmission Screen Completed By:  Cleatrice Burke, 07/31/2021 10:04 AM ______________________________________________________________________   Discussed status with Dr. Posey Pronto on 07/31/2021 at 1007 and received approval for admission today.  Admission Coordinator:  Cleatrice Burke, RN, time  1007 Date  07/31/2021   Assessment/Plan: Diagnosis: CVA Does the need for close, 24 hr/day Medical supervision in concert with the patient's rehab needs make it unreasonable for this patient to be served in a less intensive setting? Yes Co-Morbidities requiring supervision/potential complications: CAD, COPD, type 2 DM, HTN, GERD, allergic rhinitis and protein calore malnutrition Due to safety, disease management, medication administration, and patient education, does the patient require 24 hr/day rehab nursing? Yes Does the patient require coordinated care of a physician, rehab nurse, PT, OT, and SLP to address physical and functional deficits in the context of the above medical diagnosis(es)? Yes Addressing deficits in the following areas: balance, endurance, locomotion, strength, transferring, bathing, dressing, toileting, and psychosocial support Can the patient actively participate in an intensive therapy program of at least 3 hrs of therapy 5 days a week? Yes The potential for patient to make measurable gains while on inpatient rehab is excellent Anticipated functional outcomes upon discharge from inpatient rehab: min assist and mod assist PT, min assist and mod assist OT, supervision SLP Estimated rehab length of stay to reach the above functional goals is: 13-16 days. Anticipated discharge destination: Home 10. Overall Rehab/Functional Prognosis: good   MD Signature: Delice Lesch, MD, ABPMR

## 2021-07-30 NOTE — Progress Notes (Addendum)
Inpatient Rehabilitation Admissions Coordinator   CIR bed is available to admit this patient on Friday. I spoke with son, Coralyn Pear by phone and he accepts the bed. I have alerted acute team and TOC. I will make the arrangements to admit Friday.  Danne Baxter, RN, MSN Rehab Admissions Coordinator [phone] 07/30/2021 10:27 AM

## 2021-07-30 NOTE — Progress Notes (Signed)
Patient c/o restless legs this morning. Started on scheduled Lyrica and given PRN tylenol for back pain . Patient expressed relief and states his legs feel much better. He is in bed with HOB at 71. Able to swallow pills without difficulty . Alert and oriented x 4. Call bell and hydration within reach.

## 2021-07-30 NOTE — Progress Notes (Signed)
Inpatient Rehabilitation Admissions Coordinator   I spoke with patient's son by phone and he prefers Cir rather than SNF and will arrange 24/7 care for his Dad in son;s home. I will let team know if I will have a CIR bed this week  and follow up as soon as clarified.  Danne Baxter, RN, MSN Rehab Admissions Coordinator [phone] 07/30/2021 8:16 AM

## 2021-07-30 NOTE — Plan of Care (Signed)

## 2021-07-31 ENCOUNTER — Encounter (HOSPITAL_COMMUNITY): Payer: Self-pay | Admitting: Physical Medicine and Rehabilitation

## 2021-07-31 ENCOUNTER — Telehealth: Payer: Self-pay

## 2021-07-31 ENCOUNTER — Inpatient Hospital Stay (HOSPITAL_COMMUNITY)
Admission: RE | Admit: 2021-07-31 | Discharge: 2021-08-26 | DRG: 057 | Disposition: A | Discharge status: Skilled Nursing Facility | Payer: Medicare Other | Source: Other Acute Inpatient Hospital | Attending: Physical Medicine and Rehabilitation | Admitting: Physical Medicine and Rehabilitation

## 2021-07-31 ENCOUNTER — Inpatient Hospital Stay (INDEPENDENT_AMBULATORY_CARE_PROVIDER_SITE_OTHER): Payer: Medicare Other

## 2021-07-31 ENCOUNTER — Other Ambulatory Visit: Payer: Self-pay

## 2021-07-31 DIAGNOSIS — Z881 Allergy status to other antibiotic agents status: Secondary | ICD-10-CM

## 2021-07-31 DIAGNOSIS — Z20822 Contact with and (suspected) exposure to covid-19: Secondary | ICD-10-CM | POA: Diagnosis present

## 2021-07-31 DIAGNOSIS — G2581 Restless legs syndrome: Secondary | ICD-10-CM | POA: Diagnosis not present

## 2021-07-31 DIAGNOSIS — J302 Other seasonal allergic rhinitis: Secondary | ICD-10-CM | POA: Diagnosis present

## 2021-07-31 DIAGNOSIS — I2510 Atherosclerotic heart disease of native coronary artery without angina pectoris: Secondary | ICD-10-CM | POA: Diagnosis present

## 2021-07-31 DIAGNOSIS — J449 Chronic obstructive pulmonary disease, unspecified: Secondary | ICD-10-CM | POA: Diagnosis present

## 2021-07-31 DIAGNOSIS — R600 Localized edema: Secondary | ICD-10-CM | POA: Diagnosis not present

## 2021-07-31 DIAGNOSIS — I639 Cerebral infarction, unspecified: Secondary | ICD-10-CM

## 2021-07-31 DIAGNOSIS — J309 Allergic rhinitis, unspecified: Secondary | ICD-10-CM | POA: Diagnosis not present

## 2021-07-31 DIAGNOSIS — Z79899 Other long term (current) drug therapy: Secondary | ICD-10-CM

## 2021-07-31 DIAGNOSIS — I10 Essential (primary) hypertension: Secondary | ICD-10-CM

## 2021-07-31 DIAGNOSIS — N1831 Chronic kidney disease, stage 3a: Secondary | ICD-10-CM

## 2021-07-31 DIAGNOSIS — E1151 Type 2 diabetes mellitus with diabetic peripheral angiopathy without gangrene: Secondary | ICD-10-CM | POA: Diagnosis present

## 2021-07-31 DIAGNOSIS — F32A Depression, unspecified: Secondary | ICD-10-CM | POA: Diagnosis not present

## 2021-07-31 DIAGNOSIS — Z713 Dietary counseling and surveillance: Secondary | ICD-10-CM

## 2021-07-31 DIAGNOSIS — I69354 Hemiplegia and hemiparesis following cerebral infarction affecting left non-dominant side: Principal | ICD-10-CM

## 2021-07-31 DIAGNOSIS — Z23 Encounter for immunization: Secondary | ICD-10-CM | POA: Diagnosis not present

## 2021-07-31 DIAGNOSIS — F419 Anxiety disorder, unspecified: Secondary | ICD-10-CM | POA: Diagnosis present

## 2021-07-31 DIAGNOSIS — G8194 Hemiplegia, unspecified affecting left nondominant side: Secondary | ICD-10-CM

## 2021-07-31 DIAGNOSIS — G479 Sleep disorder, unspecified: Secondary | ICD-10-CM | POA: Diagnosis not present

## 2021-07-31 DIAGNOSIS — I129 Hypertensive chronic kidney disease with stage 1 through stage 4 chronic kidney disease, or unspecified chronic kidney disease: Secondary | ICD-10-CM | POA: Diagnosis present

## 2021-07-31 DIAGNOSIS — R609 Edema, unspecified: Secondary | ICD-10-CM | POA: Diagnosis not present

## 2021-07-31 DIAGNOSIS — Z743 Need for continuous supervision: Secondary | ICD-10-CM | POA: Diagnosis not present

## 2021-07-31 DIAGNOSIS — E1122 Type 2 diabetes mellitus with diabetic chronic kidney disease: Secondary | ICD-10-CM | POA: Diagnosis present

## 2021-07-31 DIAGNOSIS — M19041 Primary osteoarthritis, right hand: Secondary | ICD-10-CM | POA: Diagnosis present

## 2021-07-31 DIAGNOSIS — I6389 Other cerebral infarction: Secondary | ICD-10-CM | POA: Diagnosis not present

## 2021-07-31 DIAGNOSIS — K5900 Constipation, unspecified: Secondary | ICD-10-CM | POA: Diagnosis present

## 2021-07-31 DIAGNOSIS — I69322 Dysarthria following cerebral infarction: Secondary | ICD-10-CM

## 2021-07-31 DIAGNOSIS — Z85828 Personal history of other malignant neoplasm of skin: Secondary | ICD-10-CM | POA: Diagnosis not present

## 2021-07-31 DIAGNOSIS — N183 Chronic kidney disease, stage 3 unspecified: Secondary | ICD-10-CM | POA: Diagnosis present

## 2021-07-31 DIAGNOSIS — E1165 Type 2 diabetes mellitus with hyperglycemia: Secondary | ICD-10-CM | POA: Diagnosis present

## 2021-07-31 DIAGNOSIS — Z818 Family history of other mental and behavioral disorders: Secondary | ICD-10-CM

## 2021-07-31 DIAGNOSIS — I6300 Cerebral infarction due to thrombosis of unspecified precerebral artery: Secondary | ICD-10-CM

## 2021-07-31 DIAGNOSIS — K219 Gastro-esophageal reflux disease without esophagitis: Secondary | ICD-10-CM | POA: Diagnosis present

## 2021-07-31 DIAGNOSIS — M21611 Bunion of right foot: Secondary | ICD-10-CM | POA: Diagnosis present

## 2021-07-31 DIAGNOSIS — R001 Bradycardia, unspecified: Secondary | ICD-10-CM | POA: Diagnosis not present

## 2021-07-31 DIAGNOSIS — R531 Weakness: Secondary | ICD-10-CM | POA: Diagnosis not present

## 2021-07-31 DIAGNOSIS — F17200 Nicotine dependence, unspecified, uncomplicated: Secondary | ICD-10-CM | POA: Diagnosis not present

## 2021-07-31 DIAGNOSIS — I6381 Other cerebral infarction due to occlusion or stenosis of small artery: Secondary | ICD-10-CM | POA: Diagnosis not present

## 2021-07-31 DIAGNOSIS — H04123 Dry eye syndrome of bilateral lacrimal glands: Secondary | ICD-10-CM | POA: Diagnosis not present

## 2021-07-31 DIAGNOSIS — Z888 Allergy status to other drugs, medicaments and biological substances status: Secondary | ICD-10-CM

## 2021-07-31 DIAGNOSIS — F17210 Nicotine dependence, cigarettes, uncomplicated: Secondary | ICD-10-CM | POA: Diagnosis present

## 2021-07-31 DIAGNOSIS — E785 Hyperlipidemia, unspecified: Secondary | ICD-10-CM | POA: Diagnosis present

## 2021-07-31 DIAGNOSIS — E46 Unspecified protein-calorie malnutrition: Secondary | ICD-10-CM | POA: Diagnosis not present

## 2021-07-31 LAB — CBC
HCT: 44.5 % (ref 39.0–52.0)
Hemoglobin: 14.2 g/dL (ref 13.0–17.0)
MCH: 30.8 pg (ref 26.0–34.0)
MCHC: 31.9 g/dL (ref 30.0–36.0)
MCV: 96.5 fL (ref 80.0–100.0)
Platelets: 200 10*3/uL (ref 150–400)
RBC: 4.61 MIL/uL (ref 4.22–5.81)
RDW: 13.7 % (ref 11.5–15.5)
WBC: 8.2 10*3/uL (ref 4.0–10.5)
nRBC: 0 % (ref 0.0–0.2)

## 2021-07-31 LAB — CREATININE, SERUM
Creatinine, Ser: 1.28 mg/dL — ABNORMAL HIGH (ref 0.61–1.24)
GFR, Estimated: 55 mL/min — ABNORMAL LOW (ref >60)

## 2021-07-31 LAB — GLUCOSE, CAPILLARY
Glucose-Capillary: 164 mg/dL — ABNORMAL HIGH (ref 70–99)
Glucose-Capillary: 216 mg/dL — ABNORMAL HIGH (ref 70–99)
Glucose-Capillary: 145 mg/dL — ABNORMAL HIGH (ref 70–99)
Glucose-Capillary: 165 mg/dL — ABNORMAL HIGH (ref 70–99)

## 2021-07-31 MED ORDER — CLONAZEPAM 0.25 MG PO TBDP
0.2500 mg | ORAL_TABLET | Freq: Three times a day (TID) | ORAL | Status: DC
  Administered 2021-07-31 – 2021-08-12 (×47): 0.25 mg via ORAL
  Filled 2021-07-31 (×47): qty 1

## 2021-07-31 MED ORDER — ACETAMINOPHEN 650 MG RE SUPP: 650.0000 mg | Freq: Four times a day (QID) | RECTAL | Status: DC | PRN

## 2021-07-31 MED ORDER — EZETIMIBE 10 MG PO TABS
10.0000 mg | ORAL_TABLET | Freq: Every day | ORAL | Status: DC
  Administered 2021-08-01 – 2021-08-26 (×26): 10 mg via ORAL
  Filled 2021-07-31 (×26): qty 1

## 2021-07-31 MED ORDER — HYDROCODONE-ACETAMINOPHEN 5-325 MG PO TABS
1.0000 | ORAL_TABLET | Freq: Four times a day (QID) | ORAL | Status: DC | PRN
  Administered 2021-08-06: 1 via ORAL
  Filled 2021-07-31: qty 1

## 2021-07-31 MED ORDER — SIMVASTATIN 20 MG PO TABS
40.0000 mg | ORAL_TABLET | Freq: Every day | ORAL | Status: DC
  Administered 2021-07-31 – 2021-08-25 (×26): 40 mg via ORAL
  Filled 2021-07-31 (×26): qty 2

## 2021-07-31 MED ORDER — ACETAMINOPHEN 325 MG PO TABS
650.0000 mg | ORAL_TABLET | Freq: Four times a day (QID) | ORAL | Status: DC | PRN
  Administered 2021-08-01 – 2021-08-26 (×24): 650 mg via ORAL
  Filled 2021-07-31 (×25): qty 2

## 2021-07-31 MED ORDER — GLIMEPIRIDE 1 MG PO TABS
0.5000 mg | ORAL_TABLET | Freq: Two times a day (BID) | ORAL | Status: DC
  Administered 2021-07-31 – 2021-08-26 (×52): 0.5 mg via ORAL
  Filled 2021-07-31 (×53): qty 1

## 2021-07-31 MED ORDER — HEPARIN SODIUM (PORCINE) 5000 UNIT/ML IJ SOLN
5000.0000 [IU] | Freq: Three times a day (TID) | INTRAMUSCULAR | Status: DC
  Administered 2021-07-31 – 2021-08-18 (×54): 5000 [IU] via SUBCUTANEOUS
  Filled 2021-07-31 (×53): qty 1

## 2021-07-31 MED ORDER — ONDANSETRON HCL 4 MG/2ML IJ SOLN: 4.0000 mg | Freq: Four times a day (QID) | INTRAMUSCULAR | Status: DC | PRN

## 2021-07-31 MED ORDER — IPRATROPIUM-ALBUTEROL 0.5-2.5 (3) MG/3ML IN SOLN: 3.0000 mL | Freq: Four times a day (QID) | RESPIRATORY_TRACT | Status: DC | PRN

## 2021-07-31 MED ORDER — METOPROLOL SUCCINATE ER 25 MG PO TB24
25.0000 mg | ORAL_TABLET | Freq: Every day | ORAL | Status: DC
  Administered 2021-08-01 – 2021-08-04 (×4): 25 mg via ORAL
  Filled 2021-07-31 (×4): qty 1

## 2021-07-31 MED ORDER — METOPROLOL SUCCINATE ER 25 MG PO TB24
25.0000 mg | ORAL_TABLET | Freq: Every day | ORAL | Status: DC
  Administered 2021-07-31: 25 mg via ORAL
  Filled 2021-07-31: qty 1

## 2021-07-31 MED ORDER — LORATADINE 10 MG PO TABS
10.0000 mg | ORAL_TABLET | Freq: Every day | ORAL | Status: DC
  Administered 2021-08-01 – 2021-08-26 (×26): 10 mg via ORAL
  Filled 2021-07-31 (×26): qty 1

## 2021-07-31 MED ORDER — AMLODIPINE BESYLATE 5 MG PO TABS
5.0000 mg | ORAL_TABLET | Freq: Every day | ORAL | Status: DC
  Administered 2021-07-31: 5 mg via ORAL
  Filled 2021-07-31: qty 1

## 2021-07-31 MED ORDER — METOPROLOL SUCCINATE ER 25 MG PO TB24: 25.0000 mg | ORAL_TABLET | Freq: Every day | ORAL | 1 refills | Status: DC

## 2021-07-31 MED ORDER — INSULIN ASPART 100 UNIT/ML IJ SOLN
0.0000 [IU] | Freq: Three times a day (TID) | INTRAMUSCULAR | Status: DC
  Administered 2021-07-31: 1 [IU] via SUBCUTANEOUS
  Administered 2021-08-01: 2 [IU] via SUBCUTANEOUS
  Administered 2021-08-01: 1 [IU] via SUBCUTANEOUS
  Administered 2021-08-02: 2 [IU] via SUBCUTANEOUS
  Administered 2021-08-02: 1 [IU] via SUBCUTANEOUS
  Administered 2021-08-02 – 2021-08-03 (×3): 2 [IU] via SUBCUTANEOUS
  Administered 2021-08-04 – 2021-08-05 (×4): 1 [IU] via SUBCUTANEOUS
  Administered 2021-08-05: 2 [IU] via SUBCUTANEOUS
  Administered 2021-08-05: 1 [IU] via SUBCUTANEOUS
  Administered 2021-08-06: 2 [IU] via SUBCUTANEOUS
  Administered 2021-08-06: 1 [IU] via SUBCUTANEOUS
  Administered 2021-08-06: 2 [IU] via SUBCUTANEOUS
  Administered 2021-08-07 – 2021-08-09 (×6): 1 [IU] via SUBCUTANEOUS
  Administered 2021-08-09: 3 [IU] via SUBCUTANEOUS
  Administered 2021-08-09: 2 [IU] via SUBCUTANEOUS
  Administered 2021-08-10: 1 [IU] via SUBCUTANEOUS
  Administered 2021-08-10: 2 [IU] via SUBCUTANEOUS
  Administered 2021-08-10: 1 [IU] via SUBCUTANEOUS
  Administered 2021-08-11: 2 [IU] via SUBCUTANEOUS
  Administered 2021-08-12: 1 [IU] via SUBCUTANEOUS
  Administered 2021-08-12 – 2021-08-13 (×2): 2 [IU] via SUBCUTANEOUS
  Administered 2021-08-13 – 2021-08-14 (×3): 1 [IU] via SUBCUTANEOUS
  Administered 2021-08-14 – 2021-08-15 (×2): 2 [IU] via SUBCUTANEOUS
  Administered 2021-08-15 – 2021-08-16 (×4): 1 [IU] via SUBCUTANEOUS
  Administered 2021-08-16: 2 [IU] via SUBCUTANEOUS
  Administered 2021-08-17: 1 [IU] via SUBCUTANEOUS
  Administered 2021-08-17: 3 [IU] via SUBCUTANEOUS
  Administered 2021-08-18: 2 [IU] via SUBCUTANEOUS
  Administered 2021-08-18: 1 [IU] via SUBCUTANEOUS
  Administered 2021-08-18: 2 [IU] via SUBCUTANEOUS
  Administered 2021-08-19: 1 [IU] via SUBCUTANEOUS
  Administered 2021-08-19: 2 [IU] via SUBCUTANEOUS
  Administered 2021-08-19 – 2021-08-20 (×2): 1 [IU] via SUBCUTANEOUS
  Administered 2021-08-21: 3 [IU] via SUBCUTANEOUS
  Administered 2021-08-22 (×2): 2 [IU] via SUBCUTANEOUS
  Administered 2021-08-22 – 2021-08-25 (×7): 1 [IU] via SUBCUTANEOUS
  Administered 2021-08-25: 2 [IU] via SUBCUTANEOUS
  Administered 2021-08-26: 1 [IU] via SUBCUTANEOUS

## 2021-07-31 MED ORDER — MONTELUKAST SODIUM 10 MG PO TABS
10.0000 mg | ORAL_TABLET | Freq: Every day | ORAL | Status: DC
  Administered 2021-07-31 – 2021-08-25 (×26): 10 mg via ORAL
  Filled 2021-07-31 (×26): qty 1

## 2021-07-31 MED ORDER — AMLODIPINE BESYLATE 5 MG PO TABS
5.0000 mg | ORAL_TABLET | Freq: Every day | ORAL | Status: DC
  Administered 2021-08-01 – 2021-08-03 (×3): 5 mg via ORAL
  Filled 2021-07-31 (×3): qty 1

## 2021-07-31 MED ORDER — VITAMIN B-12 1000 MCG PO TABS
1000.0000 ug | ORAL_TABLET | Freq: Every day | ORAL | Status: DC
  Administered 2021-08-01 – 2021-08-26 (×26): 1000 ug via ORAL
  Filled 2021-07-31 (×26): qty 1

## 2021-07-31 MED ORDER — AMLODIPINE BESYLATE 5 MG PO TABS
5.0000 mg | ORAL_TABLET | Freq: Every day | ORAL | 3 refills | Status: DC
Start: 2021-07-31 — End: 2021-08-26

## 2021-07-31 MED ORDER — PANTOPRAZOLE SODIUM 40 MG PO TBEC
40.0000 mg | DELAYED_RELEASE_TABLET | Freq: Every day | ORAL | Status: DC
  Administered 2021-08-01 – 2021-08-26 (×26): 40 mg via ORAL
  Filled 2021-07-31 (×26): qty 1

## 2021-07-31 MED ORDER — ONDANSETRON HCL 4 MG PO TABS
4.0000 mg | ORAL_TABLET | Freq: Four times a day (QID) | ORAL | Status: DC | PRN
  Filled 2021-07-31: qty 1

## 2021-07-31 MED ORDER — FLUOXETINE HCL 20 MG PO CAPS
20.0000 mg | ORAL_CAPSULE | Freq: Every day | ORAL | Status: DC
  Administered 2021-08-01 – 2021-08-26 (×25): 20 mg via ORAL
  Filled 2021-07-31 (×26): qty 1

## 2021-07-31 MED ORDER — CLOPIDOGREL BISULFATE 75 MG PO TABS
75.0000 mg | ORAL_TABLET | Freq: Every day | ORAL | Status: DC
  Administered 2021-08-01 – 2021-08-26 (×26): 75 mg via ORAL
  Filled 2021-07-31 (×26): qty 1

## 2021-07-31 MED ORDER — HEPARIN SODIUM (PORCINE) 5000 UNIT/ML IJ SOLN: 5000.0000 [IU] | Freq: Three times a day (TID) | INTRAMUSCULAR | Status: DC

## 2021-07-31 MED ORDER — ASPIRIN EC 81 MG PO TBEC
81.0000 mg | DELAYED_RELEASE_TABLET | Freq: Every morning | ORAL | Status: AC
  Administered 2021-08-01 – 2021-08-16 (×16): 81 mg via ORAL
  Filled 2021-07-31 (×12): qty 1

## 2021-07-31 MED ORDER — PREGABALIN 25 MG PO CAPS
50.0000 mg | ORAL_CAPSULE | Freq: Three times a day (TID) | ORAL | Status: DC
  Administered 2021-07-31 – 2021-08-13 (×38): 50 mg via ORAL
  Filled 2021-07-31 (×38): qty 2

## 2021-07-31 NOTE — Progress Notes (Addendum)
Confirmation from neurology services plan aspirin and Plavix until 08/16/2021 then Plavix alone.

## 2021-07-31 NOTE — Discharge Instructions (Addendum)
Inpatient Rehab Discharge Instructions  [place] Discharge date and time: No discharge date for patient encounter.   Activities/Precautions/ Functional Status: Activity: activity as tolerated Diet: diabetic diet Wound Care: Routine skin checks Functional status:  ___ No restrictions     ___ Walk up steps independently ___ 24/7 supervision/assistance   ___ Walk up steps with assistance ___ Intermittent supervision/assistance  ___ Bathe/dress independently ___ Walk with walker     __x_ Bathe/dress with assistance ___ Walk Independently    ___ Shower independently ___ Walk with assistance    ___ Shower with assistance ___ No alcohol     ___ Return to work/school ________  Special Instructions: No driving smoking or alcohol  Plan aspirin and Plavix x3 weeks then plavix alone   STROKE/TIA DISCHARGE INSTRUCTIONS SMOKING Cigarette smoking nearly doubles your risk of having a stroke & is the single most alterable risk factor  If you smoke or have smoked in the last 12 months, you are advised to quit smoking for your health. Most of the excess cardiovascular risk related to smoking disappears within a year of stopping. Ask you doctor about anti-smoking medications  Quit Line: 1-800-QUIT NOW Free Smoking Cessation Classes [phone]  CHOLESTEROL Know your levels; limit fat & cholesterol in your diet  Lipid Panel     Component Value Date/Time   CHOL 164 07/27/2021 1231   TRIG 81 07/27/2021 1231   HDL 64 07/27/2021 1231   CHOLHDL 2.6 07/27/2021 1231   VLDL 16 07/27/2021 1231   LDLCALC 84 07/27/2021 1231   LDLCALC 58 03/19/2021 0915     Many patients benefit from treatment even if their cholesterol is at goal. Goal: Total Cholesterol (CHOL) less than 160 Goal:  Triglycerides (TRIG) less than 150 Goal:  HDL greater than 40 Goal:  LDL (LDLCALC) less than 100   BLOOD PRESSURE American Stroke Association blood pressure target is less that 120/80 mm/Hg  Your discharge  blood pressure is:    Monitor your blood pressure Limit your salt and alcohol intake Many individuals will require more than one medication for high blood pressure  DIABETES (A1c is a blood sugar average for last 3 months) Goal HGBA1c is under 7% (HBGA1c is blood sugar average for last 3 months)  Diabetes:    Lab Results  Component Value Date   HGBA1C 6.7 (H) 07/27/2021    Your HGBA1c can be lowered with medications, healthy diet, and exercise. Check your blood sugar as directed by your physician Call your physician if you experience unexplained or low blood sugars.  PHYSICAL ACTIVITY/REHABILITATION Goal is 30 minutes at least 4 days per week  Activity: Increase activity slowly, Therapies: Physical Therapy: Home Health Return to work:  Activity decreases your risk of heart attack and stroke and makes your heart stronger.  It helps control your weight and blood pressure; helps you relax and can improve your mood. Participate in a regular exercise program. Talk with your doctor about the best form of exercise for you (dancing, walking, swimming, cycling).  DIET/WEIGHT Goal is to maintain a healthy weight  Your discharge diet is:  Diet Order     None       liquids Your height is:    Your current weight is:   Your Body Mass Index (BMI) is:    Following the type of diet specifically designed for you will help prevent another stroke. Your goal weight range is:   Your goal Body Mass Index (BMI) is 19-24. Healthy food habits  can help reduce 3 risk factors for stroke:  High cholesterol, hypertension, and excess weight.  RESOURCES Stroke/Support Group:  Call [phone]   STROKE EDUCATION PROVIDED/REVIEWED AND GIVEN TO PATIENT Stroke warning signs and symptoms How to activate emergency medical system (call 911). Medications prescribed at discharge. Need for follow-up after discharge. Personal risk factors for stroke. Pneumonia vaccine given: No Flu vaccine given: No My questions  have been answered, the writing is legible, and I understand these instructions.  I will adhere to these goals & educational materials that have been provided to me after my discharge from the hospital.       My questions have been answered and I understand these instructions. I will adhere to these goals and the provided educational materials after my discharge from the hospital.  Patient/Caregiver Signature _______________________________ Date __________  Clinician Signature _______________________________________ Date __________  Please bring this form and your medication list with you to all your follow-up doctor's appointments.

## 2021-07-31 NOTE — Progress Notes (Addendum)
Inpatient Rehabilitation Admission Medication Review by a Pharmacist  A complete drug regimen review was completed for this patient to identify any potential clinically significant medication issues.  High Risk Drug Classes Is patient taking? Indication by Medication  Antipsychotic No   Anticoagulant Yes Heparin for DVT prophylaxis  Antibiotic No   Opioid Yes Norco PRN for acute pain   Antiplatelet Yes Aspirin and clopidogrel for stroke prophylaxis  Hypoglycemics/insulin Yes Glimepiride and insulin sliding scale for diabetes   Vasoactive Medication Yes Metoprolol and amlodipine for HTN   Chemotherapy No   Other Yes  Clonazepam for anxiety      Type of Medication Issue Identified Description of Issue Recommendation(s)  Drug Interaction(s) (clinically significant)     Duplicate Therapy     Allergy     No Medication Administration End Date  DAPT with no end date specified before admission. Clarified with Neurology, can stop aspirin in 3 weeks (10/30)  Incorrect Dose     Additional Drug Therapy Needed  BP has been 160-180s with goal of normalization; amlodipine just added 10/14  Amlodipine will require a few days to start working. Consider increasing amlodipine to 10mg . If still not at goal, can add home ramipril   Significant med changes from prior encounter (inform family/care partners about these prior to discharge). Stopped home HCTZ, ibuprofen, ramipril; Increased metoprolol dose; Started amlodipine,  clopidogrel, and pregabalin   Other       Clinically significant medication issues were identified that warrant physician communication and completion of prescribed/recommended actions by midnight of the next day:  No  Name of provider notified for non-urgent issues identified: Mar[place]e [place]  Provider Method of Notification: Secure chat    Time spent performing this drug regimen review (minutes):  [place], PharmD, [address], [facility] Clinical Pharmacist  Please check AMION  for all [place] phone numbers After 10:00 PM, call [place]

## 2021-07-31 NOTE — Progress Notes (Signed)
Zio monitor is place at discharge.

## 2021-07-31 NOTE — Progress Notes (Signed)
   Made aware by the Hospitalist team this patient needs a monitor for recent CVA. He is planning to be discharged to CIR today and only Zio monitors are available for placement prior to discharge. Will therefore place a 14-day Zio. Pending Neurology follow-up, can consider a 30-day monitor if indicated in the future.  Signed, Erma Heritage, PA-C 07/31/2021, 9:08 AM

## 2021-07-31 NOTE — Progress Notes (Signed)
INPATIENT REHABILITATION ADMISSION NOTE   Arrival Method:     Mental Orientation: A&Ox4   Assessment: see flowsheets   Skin: skin intact. Ecchymosis on LUE and back.    IV'S:   Pain: no pain   Tubes and Drains: none   Safety Measures: fall risk. Bed alarm on.    Vital Signs: see flowsheets   Height and Weight: see flowsheets    Rehab Orientation: done by RN.    Family: son and granddaughters at bedside.     Notes:

## 2021-07-31 NOTE — H&P (Signed)
Physical Medicine and Rehabilitation Admission H&P    Chief Complaint  Patient presents with   Weakness  : HPI: Barry [name] is a 87 year old right-handed male with history of CAD maintained on low-dose aspirin, CKD stage III, COPD/tobacco use, type 2 diabetes mellitus, hypertension, GERD.  History taken from patient and chart review.  Patient lives alone.  Mobile home with 3 steps to entry.  Reportedly independent prior to admission using an occasional cane.  He does have a son in the [place] area who works and can provide assistance in the evening.  He presented on 07/27/2021 to [facility] with acute onset of left-sided hemiparesis.  CT/MRI showed acute right posterior lentiform nucleus, thalamus, anterior caudate infarct with additional punctate infarct in the lateral left thalamus and possibly the left lentiform nucleus.  CT angiogram head and neck with mild atherosclerotic plaque of the bilateral carotid bulbs resulting in 30 to 40% stenosis on the right.  1 cm enhancing lesion in the right parotid gland indeterminate recommend ENT follow-up as outpatient.  MRA head showed no significant proximal stenosis aneurysm or branch vessel occlusion.  Echocardiogram with ejection fraction of 60 to 51%, grade 1 diastolic dysfunction.  TEE completed showing no left atrial/left atrial appendage or thrombus detected.  No PFO by bubble.  He currently mains on aspirin and Plavix for CVA prophylaxis.  Subcutaneous heparin for DVT prophylaxis.  Tolerating regular consistency diet.  Therapy evaluations completed due to patient's left hemiparesis was admitted for a comprehensive rehab program.  Please see preadmission assessment from earlier today as well.  Review of Systems  Constitutional:  Negative for chills and fever.  HENT:  Negative for hearing loss.   Eyes:  Negative for blurred vision and double vision.  Respiratory:  Negative for cough.        Shortness of breath with heavy exertion.   Cardiovascular:  Negative for chest pain, palpitations and leg swelling.  Gastrointestinal:  Positive for constipation. Negative for heartburn, nausea and vomiting.       GERD  Genitourinary:  Positive for urgency. Negative for dysuria, flank pain and hematuria.  Musculoskeletal:  Positive for joint pain. Negative for myalgias.  Skin:  Negative for rash.  Neurological:  Positive for focal weakness and weakness.  Psychiatric/Behavioral:  Positive for depression.        Anxiety  All other systems reviewed and are negative. Past Medical History:  Diagnosis Date   AAA (abdominal aortic aneurysm) 3-09   4.7 cm   Allergy    rhinitis   Anxiety    Cancer ([place])    skin   Cataract    Depression    Diabetes mellitus    Diverticulosis    GERD (gastroesophageal reflux disease)    Hepatitis    Hx of colonic polyps 08/26/2015   Hyperlipidemia    Hypertension    Nephrolithiasis    Personal history of colonic polyps    adenomas, serrated also   PVD (peripheral vascular disease) ([place])    Tobacco abuse    Past Surgical History:  Procedure Laterality Date   COLONOSCOPY  2006, 2008, 2010, 05/27/2011   numerous adenomas - 13 in 2006, 3, 2008, 2 2012 (up to 1 cm), 4 diminutive adenomas, serrated adenomas 2012. Diverticulosis and hemorrhoids also.   EYE SURGERY     EYE SURGERY Left    Kidney stone operation     lower limb amputation, other toe 5th     percutaneous stent graft  repair of infrarenal AAA  11/10   5.6 cm (T. early)   surgical excision basal cell carcinoma     tympanic eardrum repair     VASECTOMY     Family History  Problem Relation Age of Onset   Emphysema Father    Cancer Brother        Lung   Depression Brother    Depression Sister    Lung disease Sister        Fibrosis and died from pneumonia on 04/12/2013   Kidney disease Sister    Diabetes Sister    Learning disabilities Sister    Mental retardation Sister    Heart disease Mother    Cancer Brother    Colon cancer  Neg Hx    Dementia Neg Hx    Alcohol abuse Neg Hx    Drug abuse Neg Hx    Paranoid behavior Neg Hx    Schizophrenia Neg Hx    Anxiety disorder Neg Hx    Bipolar disorder Neg Hx    OCD Neg Hx    Sexual abuse Neg Hx    Physical abuse Neg Hx    Seizures Neg Hx    Esophageal cancer Neg Hx    Stomach cancer Neg Hx    Rectal cancer Neg Hx    Social History:  reports that he has been smoking cigarettes. He has a 60.00 pack-year smoking history. He has never used smokeless tobacco. He reports that he does not drink alcohol and does not use drugs. Allergies:  Allergies  Allergen Reactions   Lisinopril-Hydrochlorothiazide Swelling    ZESTRIL   Doxycycline Rash   No medications prior to admission.    Drug Regimen Review Drug regimen was reviewed and remains appropriate with no significant issues identified  Home: Home Living Family/patient expects to be discharged to:: Private residence Living Arrangements: Alone Available Help at Discharge: Family, Available 24 hours/day (son will hire caregivers when he works) Type of Home: Mobile home Home Access: Stairs to enter Technical brewer of Steps: 3 Entrance Stairs-Rails: Right, Left, Can reach both [place]: One level Bathroom Shower/Tub: Chiropodist: Standard Bathroom Accessibility: Yes Home Equipment: Environmental consultant - 2 wheels, Cane - single point, Wheelchair - manual Additional Comments: son and dtr in law live in [place] and work so they will hire 24/7 assist when they work  Lives With: Alone   Functional History: Prior Function Level of Independence: Independent Comments: Hydrographic surveyor, drives  Functional Status:  Mobility: Bed Mobility Overal bed mobility: Needs Assistance Bed Mobility: Supine to Sit Supine to sit: Mod assist, Max assist General bed mobility comments: as per OT notes Transfers Overall transfer level: Needs assistance Equipment used: Rolling walker (2 wheeled) Transfers:  Stand Pivot Transfers, Sit to/from Stand Sit to Stand: Max assist Stand pivot transfers: Max assist General transfer comment: as per OT notes Ambulation/Gait Ambulation/Gait assistance: Max Web designer (Feet): 4 Feet Assistive device: Rolling walker (2 wheeled) Gait Pattern/deviations: Decreased step length - right, Decreased step length - left, Decreased stance time - left, Decreased stride length, Decreased dorsiflexion - left, Shuffle General Gait Details: limited to a few slow labored side steps with frequent buckling of left knee, poor coordination, required Max tactile assistance to shuffle LLE when attempting steps Gait velocity: slow    ADL: ADL Overall ADL's : Needs assistance/impaired Eating/Feeding: Sitting, Minimal assistance Grooming: Sitting, Minimal assistance, Moderate assistance Upper Body Bathing: Minimal assistance, Sitting, Min guard Lower Body Bathing: Moderate assistance, Maximal  assistance, Sitting/lateral leans Upper Body Dressing : Min guard, Minimal assistance, Sitting Lower Body Dressing: Moderate assistance, Maximal assistance, Sitting/lateral leans Toilet Transfer: Maximal assistance, RW, Stand-pivot Toilet Transfer Details (indicate cue type and reason): Simulated via EOB to chair transfer with RW Toileting- Clothing Manipulation and Hygiene: Maximal assistance, Sitting/lateral lean Tub/ Shower Transfer: Maximal assistance, Stand-pivot, Rolling walker, Tub bench Functional mobility during ADLs: Maximal assistance, Rolling walker General ADL Comments: All levels taken per clinical judgment based on bed mobility, EOB to chair transfer, and UE assessment.  Cognition: Cognition Overall Cognitive Status: Within Functional Limits for tasks assessed Orientation Level: Oriented X4 Cognition Arousal/Alertness: Awake/alert Behavior During Therapy: WFL for tasks assessed/performed Overall Cognitive Status: Within Functional Limits for tasks  assessed  Physical Exam: Blood pressure (!) 179/84, pulse 68, temperature 98 F (36.7 C), temperature source Oral, resp. rate 20, height 5\' 11"  (1.803 m), weight 75.2 kg, SpO2 98 %. Physical Exam Vitals reviewed.  Constitutional:      General: He is not in acute distress.    Appearance: He is normal weight. He is not ill-appearing.  HENT:     Head: Normocephalic and atraumatic.     Right Ear: External ear normal.     Left Ear: External ear normal.     Nose: Nose normal.  Eyes:     General:        Right eye: No discharge.        Left eye: No discharge.     Extraocular Movements: Extraocular movements intact.  Cardiovascular:     Rate and Rhythm: Normal rate and regular rhythm.  Pulmonary:     Effort: Pulmonary effort is normal. No respiratory distress.     Breath sounds: No stridor.  Abdominal:     General: Abdomen is flat. Bowel sounds are normal. There is no distension.  Musculoskeletal:     Cervical back: Normal range of motion and neck supple.     Comments: No edema or tenderness in extremities  Skin:    General: Skin is warm and dry.  Neurological:     Mental Status: He is alert.     Comments: Alert No acute distress.   Makes eye contact with examiner.   Follows commands.   Name and age.   He does have some mild delay in processing. Dysarthria Motor: RUE/RLE: 4/5 proximal distal LUE: 4-/5 proximal distal LLE: 1+/5 proximal distal  Psychiatric:        Mood and Affect: Mood normal.     Comments: Somewhat delayed.    Results for orders placed or performed during the hospital encounter of 07/27/21 (from the past 48 hour(s))  Glucose, capillary     Status: Abnormal   Collection Time: 07/29/21  1:27 PM  Result Value Ref Range   Glucose-Capillary 101 (H) 70 - 99 mg/dL    Comment: Glucose reference range applies only to samples taken after fasting for at least 8 hours.  Glucose, capillary     Status: Abnormal   Collection Time: 07/29/21  4:10 PM  Result Value Ref  Range   Glucose-Capillary 118 (H) 70 - 99 mg/dL    Comment: Glucose reference range applies only to samples taken after fasting for at least 8 hours.  Glucose, capillary     Status: Abnormal   Collection Time: 07/29/21  8:18 PM  Result Value Ref Range   Glucose-Capillary 146 (H) 70 - 99 mg/dL    Comment: Glucose reference range applies only to samples taken after fasting for at  least 8 hours.  Glucose, capillary     Status: Abnormal   Collection Time: 07/30/21  7:22 AM  Result Value Ref Range   Glucose-Capillary 157 (H) 70 - 99 mg/dL    Comment: Glucose reference range applies only to samples taken after fasting for at least 8 hours.  Glucose, capillary     Status: Abnormal   Collection Time: 07/30/21 11:06 AM  Result Value Ref Range   Glucose-Capillary 186 (H) 70 - 99 mg/dL    Comment: Glucose reference range applies only to samples taken after fasting for at least 8 hours.  Glucose, capillary     Status: Abnormal   Collection Time: 07/30/21  5:05 PM  Result Value Ref Range   Glucose-Capillary 211 (H) 70 - 99 mg/dL    Comment: Glucose reference range applies only to samples taken after fasting for at least 8 hours.  Glucose, capillary     Status: None   Collection Time: 07/30/21  9:44 PM  Result Value Ref Range   Glucose-Capillary 97 70 - 99 mg/dL    Comment: Glucose reference range applies only to samples taken after fasting for at least 8 hours.  Glucose, capillary     Status: Abnormal   Collection Time: 07/31/21  7:22 AM  Result Value Ref Range   Glucose-Capillary 164 (H) 70 - 99 mg/dL    Comment: Glucose reference range applies only to samples taken after fasting for at least 8 hours.  Glucose, capillary     Status: Abnormal   Collection Time: 07/31/21 11:09 AM  Result Value Ref Range   Glucose-Capillary 216 (H) 70 - 99 mg/dL    Comment: Glucose reference range applies only to samples taken after fasting for at least 8 hours.   ECHO TEE  Result Date: 07/29/2021     TRANSESOPHOGEAL ECHO REPORT   Patient Name:   AYDN FERRARA Date of Exam: 07/29/2021 Medical Rec #:  [id number]         Height:       71.0 in Accession #:    [id number]        Weight:       165.8 lb Date of Birth:  [birth year]        BSA:          1.947 m Patient Age:    25 years          BP:           220/63 mmHg Patient Gender: M                 HR:           70 bpm. Exam Location:  [facility] Procedure: Transesophageal Echo, Cardiac Doppler and Color Doppler Indications:    Stroke  History:        Patient has prior history of Echocardiogram examinations, most                 recent 07/28/2021. CAD, COPD, Arrythmias:PVC; Risk                 Factors:Hypertension, Diabetes, Dyslipidemia and Current Smoker.  Sonographer:    Alvino Chapel RCS Referring Phys: [phone] [place]: After discussion of the risks and benefits of a TEE, an informed consent was obtained from the patient. TEE procedure time was 15 minutes. The transesophogeal probe was passed without difficulty through the esophogus of the patient. Sedation performed by different physician. The patient was monitored while under deep  sedation. Anesthestetic sedation was provided intravenously by Anesthesiology: 250mg  of Propofol, 50mg  of Lidocaine. Image quality was excellent. The patient's vital signs; including heart rate, blood pressure, and oxygen saturation; remained stable throughout the procedure. The patient developed no complications during the procedure. IMPRESSIONS  1. Left ventricular ejection fraction, by estimation, is 60 to 65%. The left ventricle has normal function.  2. Right ventricular systolic function is normal. The right ventricular size is normal.  3. No left atrial/left atrial appendage thrombus was detected. The LAA emptying velocity was 90 cm/s.  4. The mitral valve is grossly normal. Trivial mitral valve regurgitation. No evidence of mitral stenosis.  5. The aortic valve is tricuspid. There is moderate calcification of  the aortic valve. There is moderate thickening of the aortic valve. Aortic valve regurgitation is not visualized. Moderate aortic valve stenosis. Aortic valve area, by VTI measures 1.31 cm. Aortic valve mean gradient measures 23.3 mmHg. Aortic valve Vmax measures 3.10 m/s.  6. There is Moderate (Grade III) layered plaque involving the descending aorta and transverse aorta.  7. Agitated saline contrast bubble study was negative, with no evidence of any interatrial shunt. Conclusion(s)/Recommendation(s): No LA/LAA thrombus identified. Negative bubble study for interatrial shunt. No intracardiac source of embolism detected on this on this transesophageal echocardiogram. FINDINGS  Left Ventricle: Left ventricular ejection fraction, by estimation, is 60 to 65%. The left ventricle has normal function. The left ventricular internal cavity size was normal in size. Right Ventricle: The right ventricular size is normal. No increase in right ventricular wall thickness. Right ventricular systolic function is normal. Left Atrium: Left atrial size was normal in size. No left atrial/left atrial appendage thrombus was detected. The LAA emptying velocity was 90 cm/s. Right Atrium: Right atrial size was normal in size. Prominent Eustachian valve. Pericardium: Trivial pericardial effusion is present. Mitral Valve: The mitral valve is grossly normal. Trivial mitral valve regurgitation. No evidence of mitral valve stenosis. Tricuspid Valve: The tricuspid valve is grossly normal. Tricuspid valve regurgitation is mild . No evidence of tricuspid stenosis. Aortic Valve: The aortic valve is tricuspid. There is moderate calcification of the aortic valve. There is moderate thickening of the aortic valve. Aortic valve regurgitation is not visualized. Moderate aortic stenosis is present. Aortic valve mean gradient measures 23.3 mmHg. Aortic valve peak gradient measures 38.5 mmHg. Aortic valve area, by VTI measures 1.31 cm. Pulmonic Valve: The  pulmonic valve was grossly normal. Pulmonic valve regurgitation is not visualized. No evidence of pulmonic stenosis. Aorta: The aortic root and ascending aorta are structurally normal, with no evidence of dilitation. There is moderate (Grade III) layered plaque involving the descending aorta and transverse aorta. Venous: The left upper pulmonary vein, left lower pulmonary vein, right lower pulmonary vein and right upper pulmonary vein are normal. IAS/Shunts: No atrial level shunt detected by color flow Doppler. Agitated saline contrast was given intravenously to evaluate for intracardiac shunting. Agitated saline contrast bubble study was negative, with no evidence of any interatrial shunt.  LEFT VENTRICLE PLAX 2D LVOT diam:     2.20 cm LV SV:         86 LV SV Index:   44 LVOT Area:     3.80 cm  AORTIC VALVE AV Area (Vmax):    1.53 cm AV Area (Vmean):   1.52 cm AV Area (VTI):     1.31 cm AV Vmax:           310.36 cm/s AV Vmean:  231.530 cm/s AV VTI:            0.660 m AV Peak Grad:      38.5 mmHg AV Mean Grad:      23.3 mmHg LVOT Vmax:         124.89 cm/s LVOT Vmean:        92.445 cm/s LVOT VTI:          0.227 m LVOT/AV VTI ratio: 0.34  AORTA Ao Root diam: 3.48 cm Ao Asc diam:  3.36 cm TRICUSPID VALVE TR Peak grad:   5.8 mmHg TR Vmax:        120.00 cm/s  SHUNTS Systemic VTI:  0.23 m Systemic Diam: 2.20 cm Eleonore Chiquito MD Electronically signed by Eleonore Chiquito MD Signature Date/Time: 07/29/2021/4:27:23 PM    Final        Medical Problem List and Plan: 1.  Left-sided hemiparesis secondary to infarct right posterior lentiform nucleus, thalamus, and tail of the caudate with additional punctate infarct in the lateral left thalamus.    -patient may shower  -ELOS/Goals: 14-17 days/supervision/min a  Admit to CIR 2.  Antithrombotics: -DVT/anticoagulation:  Pharmaceutical: Heparin  -antiplatelet therapy: Aspirin 81 mg daily and Plavix 75 mg daily until 10/30, then Plavix alone. 3. Pain Management:  Lyrica 50 mg 3 times daily, hydrocodone as needed  Monitor with increased exertion 4. Mood: Klonopin 0.25 mg 3 times daily, Prozac 20 mg daily  -antipsychotic agents: N/A 5. Neuropsych: This patient is capable of making decisions on his own behalf. 6. Skin/Wound Care: Routine skin checks 7. Fluids/Electrolytes/Nutrition: Routine in and outs  CMP ordered for tomorrow 8.  Hypertension.  Norvasc 5 mg daily, Toprol-XL 25 mg daily.    Monitor with increased mobility 9.  Hyperlipidemia.  Zocor/Zetia 10.  COPD with history of tobacco use.  Continue nebulizers as directed as well as Singulair.  Check oxygen saturations every shift 11.  Diabetes mellitus with hyperglycemia.  Hemoglobin A1c 6.7.  Amaryl 0.5 mg twice daily  Monitor with increased activity 12.  Right side parotid mass.  Follow-up outpatient ENT 13.  CKD stage III.  Baseline creatinine 1.1-1.3.    CMP ordered 14.  GERD.  Protonix   Lavon Paganini Angiulli, PA-C 07/31/2021  I have personally performed a face to face diagnostic evaluation, including, but not limited to relevant history and physical exam findings, of this patient and developed relevant assessment and plan.  Additionally, I have reviewed and concur with the physician assistant's documentation above.  Delice Lesch, MD, ABPMR  The patient's status has not changed. Any changes from the pre-admission screening or documentation from the acute chart are noted above.   Delice Lesch, MD, ABPMR

## 2021-07-31 NOTE — Discharge Summary (Signed)
Physician Discharge Summary [facility]    Patient: Barry [name]                   Admit date: 07/27/2021   DOB: [birth year]             Discharge date:07/31/2021/7:49 AM QZR:[id number]                          PCP: Barry Frizzle, MD  Disposition:  CIR   Recommendations for Outpatient Follow-up:   Follow up: Neurologist, continue aspirin Plavix, Neurologist regarding uncontrolled restless leg syndrome on this admission Lyrica was added, continue low-dose Klonopin Follow-up with cardiologist to establish and monitoring your aortic stenosis (TEE determined moderate aortic stenosis) Also need to follow-up with cardiologist regarding 30-day Holter monitor placement Small parotid mass-incidental finding, ENT follow-up in next 2 to 3 weeks   Discharge Condition: Stable   Code Status:   Code Status: Full Code  Diet recommendation: Diabetic diet  The patient was seen and examined this morning, remained stable, blood pressure elevated, home medication of HCTZ and metoprolol was changed to Norvasc and Toprol-XL.  Patient stable to be discharged to CIR.   Discharge Diagnoses:    Active Problems:   CVA (cerebral vascular accident) [facility])   History of Present Illness/ Hospital Course Barry [name] Summary:   Barry [name] is a 87 y.o. male with medical history significant for CAD, COPD, type 2 diabetes, hypertension, GERD, allergic rhinitis, and protein calorie malnutrition who presented to the ED with complaints of weakness and vertigo for quite some time now.  He cannot state an exact timeframe, but states that both of his legs were weak bilaterally this morning as he was standing on his porch.  Patient is noted to have bilateral CVA likely cardioembolic in origin.  TEE pending for a.m.  PT/OT evaluation pending with likely need for SNF.     Active Problems:   CVA (cerebral vascular accident) ([place])     Left-sided hemiparesis due to bilateral CVA -Likely  cardioembolic in origin, TTE with no acute findings and LVEF 60-65% -TEE 07/29/2021>> essentially within normal-exception of moderate aortic stenosis negative for any clots -With noted left-sided hemiparesis, ongoing--mild improvement -PT/OT -status post evaluation recommending SNF versus CIR -Neurology consulted recommended dual antiplatelet treatment  - 30-day Holter monitor -Continue Plavix and aspirin as ordered -Hemoglobin A1c 6.7% and LDL 84   Right-sided parotid mass -Incidental finding which will require ENT follow-up in outpatient setting   Aortic stenosis-moderate  -Incidental finding on TEE -Recommended continue close monitoring with cardiologist as an outpatient -Continue currently recommended medication  CKD stage IIIa -Baseline creatinine 1.1-1.3 -Continue to monitor   Hypertension -BP meds were held for permissive hypertension -Home medication HCTZ and  metoprolol--were changed -Blood pressure remained elevated Norvasc 5 mg, metoprolol XL 25 mg daily was added    CAD -Continue on Plavix, aspirin and statins as ordered    COPD with ongoing tobacco abuse -Counseled on cessation -Bronchodilators as needed   Type 2 diabetes -SSI -A1c 6.7% -Resume diabetic diet, home medication of Amaryl   GERD -Continue Protonix      Code Status: Full Family Communication: Son at bedside 10/11 Disposition Plan:   Dispo: The patient is from: Home              Anticipated d/c is to: SNF VS CIR     Discharge Instructions:   Discharge Instructions     Activity  as tolerated - No restrictions   Complete by: As directed    Call MD for:  difficulty breathing, headache or visual disturbances   Complete by: As directed    Call MD for:  persistant dizziness or light-headedness   Complete by: As directed    Call MD for:  redness, tenderness, or signs of infection (pain, swelling, redness, odor or green/yellow discharge around incision site)   Complete by: As directed     Call MD for:  temperature >100.4   Complete by: As directed    Diet - low sodium heart healthy   Complete by: As directed    Discharge instructions   Complete by: As directed    Follow-up with neurology within next 2 to 3 days, continue discussion regarding duration of aspirin and Plavix subsequent recommendation for monotherapy.. Also discussed your restless leg syndrome currently continue low-dose Klonopin and Lyrica. Continue aggressive PT OT, fall precaution   Increase activity slowly   Complete by: As directed         Medication List     STOP taking these medications    amoxicillin 500 MG tablet Commonly known as: AMOXIL   clonazePAM 0.5 MG tablet Commonly known as: KLONOPIN Replaced by: clonazePAM 0.25 MG disintegrating tablet   dapagliflozin propanediol 5 MG Tabs tablet Commonly known as: Farxiga   hydrochlorothiazide 25 MG tablet Commonly known as: HYDRODIURIL   HYDROcodone-acetaminophen 5-325 MG tablet Commonly known as: NORCO/VICODIN   ibuprofen 200 MG tablet Commonly known as: ADVIL   ipratropium 0.03 % nasal spray Commonly known as: ATROVENT   NIFEdipine 60 MG 24 hr tablet Commonly known as: ADALAT CC   polyethylene glycol 17 g packet Commonly known as: MIRALAX / GLYCOLAX   ramipril 10 MG capsule Commonly known as: ALTACE   sildenafil 100 MG tablet Commonly known as: VIAGRA       TAKE these medications    amLODipine 5 MG tablet Commonly known as: NORVASC Take 1 tablet (5 mg total) by mouth daily for 5 days.   aspirin EC 81 MG tablet Take 81 mg by mouth every morning.   cetirizine 10 MG tablet Commonly known as: ZYRTEC Take 1 tablet (10 mg total) by mouth daily.   clonazePAM 0.25 MG disintegrating tablet Commonly known as: KLONOPIN Take 1 tablet (0.25 mg total) by mouth 4 (four) times daily -  before meals and at bedtime. Replaces: clonazePAM 0.5 MG tablet   clopidogrel 75 MG tablet Commonly known as: PLAVIX Take 1 tablet (75  mg total) by mouth daily.   ezetimibe 10 MG tablet Commonly known as: ZETIA TAKE 1 TABLET BY MOUTH DAILY, KEEP APPOINTMENT FOR FUTURE REFILL GENERIC EQUIVALENT FOR ZETIA   FLUoxetine 20 MG capsule Commonly known as: PROZAC TAKE (1) CAPSULE BY MOUTH EACH MORNING.   glimepiride 1 MG tablet Commonly known as: AMARYL Take 0.5 tablets (0.5 mg total) by mouth 2 (two) times daily. Take 1/2 tablet once a day What changed: additional instructions   insulin aspart 100 UNIT/ML injection Commonly known as: novoLOG Inject 0-9 Units into the skin 3 (three) times daily with meals.   metoprolol succinate 25 MG 24 hr tablet Commonly known as: TOPROL-XL Take 1 tablet (25 mg total) by mouth daily. What changed:  how much to take how to take this when to take this additional instructions   montelukast 10 MG tablet Commonly known as: SINGULAIR Take 1 tablet (10 mg total) by mouth at bedtime.   pantoprazole 40 MG tablet Commonly known  as: PROTONIX Take 1 tablet (40 mg total) by mouth daily.   pregabalin 50 MG capsule Commonly known as: LYRICA Take 1 capsule (50 mg total) by mouth 2 (two) times daily.   simvastatin 40 MG tablet Commonly known as: ZOCOR TAKE (1) TABLET BY MOUTH AT BEDTIME. What changed: See the new instructions.   vitamin B-12 1000 MCG tablet Commonly known as: CYANOCOBALAMIN Take 1,000 mcg by mouth daily.        Allergies  Allergen Reactions   Lisinopril-Hydrochlorothiazide Swelling    ZESTRIL   Doxycycline Rash     Procedures /Studies:   MR BRAIN WO CONTRAST  Result Date: 07/27/2021 CLINICAL DATA:  Neuro deficit, stroke suspected EXAM: MRI HEAD WITHOUT CONTRAST TECHNIQUE: Multiplanar, multiecho pulse sequences of the brain and surrounding structures were obtained without intravenous contrast. COMPARISON:  Same day CT head, MRI 12/17/2018. FINDINGS: Brain: Restricted diffusion in the right posterior lentiform nucleus, right thalamus, and tail of the caudate  (series 5, image 18, with additional punctate infarct lateral left thalamus/medial left lentiform nucleus (series 5, image 18). Possible punctate focus in the left lentiform nucleus (series 5, image 17), with possible ADC correlate. Confluent T2 hyperintense signal in the periventricular white matter and pons, likely the sequela of severe chronic small vessel ischemic disease. Foci of susceptibility in the posterior left frontal lobe (series 9, images 52 and 47), likely sequela of remote hypertensive microhemorrhage. Vascular: Normal flow voids. Skull and upper cervical spine: Normal marrow signal. Sinuses/Orbits: Negative.  Status post bilateral lens replacements. Other: Fluid throughout the left mastoid air cells. IMPRESSION: Acute infarcts in the right posterior lentiform nucleus, thalamus, and tail of the caudate, with additional punctate infarct in the lateral left thalamus and possibly the left lentiform nucleus. Given multiple vascular territories, consider an embolic etiology. These results were called by telephone at the time of interpretation on 07/27/2021 at 6:02 PM to provider [facility] , who verbally acknowledged these results. Electronically Signed   By: Merilyn Baba M.D.   On: 07/27/2021 18:02   ECHOCARDIOGRAM COMPLETE  Result Date: 07/28/2021    ECHOCARDIOGRAM REPORT   Patient Name:   JUDSON TSAN Date of Exam: 07/28/2021 Medical Rec #:  [id number]         Height:       71.0 in Accession #:    [id number]        Weight:       165.8 lb Date of Birth:  25-Mar-1933        BSA:          1.947 m Patient Age:    34 years          BP:           186/64 mmHg Patient Gender: M                 HR:           58 bpm. Exam Location:  [facility] Procedure: 2D Echo, Cardiac Doppler and Color Doppler Indications:    Stroke  History:        Patient has prior history of Echocardiogram examinations, most                 recent 04/09/2020. CAD, Stroke and COPD, Arrythmias:PVC,                  Signs/Symptoms:Chest Pain; Risk Factors:Hypertension, Diabetes,                 Dyslipidemia  and Current Smoker. AAA.  Sonographer:    Wenda Low Referring Phys: [id number] [place] D H[place]  1. Left ventricular ejection fraction, by estimation, is 60 to 65%. The left ventricle has normal function. The left ventricle has no regional wall motion abnormalities. There is mild left ventricular hypertrophy. Left ventricular diastolic parameters are consistent with Grade I diastolic dysfunction (impaired relaxation).  2. Right ventricular systolic function is normal. The right ventricular size is normal.  3. The mitral valve is normal in structure. No evidence of mitral valve regurgitation. No evidence of mitral stenosis.  4. The aortic valve is normal in structure. There is moderate calcification of the aortic valve. There is moderate thickening of the aortic valve. Aortic valve regurgitation is not visualized. Mild to moderate aortic valve stenosis. Aortic valve area, by VTI measures 1.31 cm. Aortic valve mean gradient measures 14.0 mmHg. Aortic valve Vmax measures 2.64 m/s.  5. The inferior vena cava is normal in size with greater than 50% respiratory variability, suggesting right atrial pressure of 3 mmHg. Conclusion(s)/Recommendation(s): No intracardiac source of embolism detected on this transthoracic study. A transesophageal echocardiogram is recommended to exclude cardiac source of embolism if clinically indicated. FINDINGS  Left Ventricle: Left ventricular ejection fraction, by estimation, is 60 to 65%. The left ventricle has normal function. The left ventricle has no regional wall motion abnormalities. The left ventricular internal cavity size was normal in size. There is  mild left ventricular hypertrophy. Left ventricular diastolic parameters are consistent with Grade I diastolic dysfunction (impaired relaxation). Right Ventricle: The right ventricular size is normal. No increase in right  ventricular wall thickness. Right ventricular systolic function is normal. Left Atrium: Left atrial size was normal in size. Right Atrium: Right atrial size was normal in size. Pericardium: There is no evidence of pericardial effusion. Mitral Valve: The mitral valve is normal in structure. Mild mitral annular calcification. No evidence of mitral valve regurgitation. No evidence of mitral valve stenosis. MV peak gradient, 5.7 mmHg. The mean mitral valve gradient is 2.0 mmHg. Tricuspid Valve: The tricuspid valve is normal in structure. Tricuspid valve regurgitation is not demonstrated. No evidence of tricuspid stenosis. Aortic Valve: The aortic valve is normal in structure. There is moderate calcification of the aortic valve. There is moderate thickening of the aortic valve. Aortic valve regurgitation is not visualized. Mild to moderate aortic stenosis is present. Aortic valve mean gradient measures 14.0 mmHg. Aortic valve peak gradient measures 27.9 mmHg. Aortic valve area, by VTI measures 1.31 cm. Pulmonic Valve: The pulmonic valve was normal in structure. Pulmonic valve regurgitation is not visualized. No evidence of pulmonic stenosis. Aorta: The aortic root is normal in size and structure. Venous: The inferior vena cava is normal in size with greater than 50% respiratory variability, suggesting right atrial pressure of 3 mmHg. IAS/Shunts: No atrial level shunt detected by color flow Doppler.  LEFT VENTRICLE PLAX 2D LVIDd:         4.00 cm      Diastology LVIDs:         2.80 cm      LV e' medial:    6.85 cm/s LV PW:         1.40 cm      LV E/e' medial:  9.7 LV IVS:        1.30 cm      LV e' lateral:   7.94 cm/s LVOT diam:     2.00 cm      LV E/e' lateral: 8.4 LV  SV:         79 LV SV Index:   41 LVOT Area:     3.14 cm  LV Volumes (MOD) LV vol d, MOD A2C: 99.3 ml LV vol d, MOD A4C: 121.0 ml LV vol s, MOD A2C: 53.2 ml LV vol s, MOD A4C: 57.2 ml LV SV MOD A2C:     46.1 ml LV SV MOD A4C:     121.0 ml LV SV MOD BP:       56.8 ml RIGHT VENTRICLE RV Basal diam:  3.50 cm RV Mid diam:    3.00 cm RV S prime:     14.70 cm/s TAPSE (M-mode): 2.6 cm LEFT ATRIUM             Index        RIGHT ATRIUM           Index LA diam:        3.30 cm 1.69 cm/m   RA Area:     19.50 cm LA Vol (A2C):   70.3 ml 36.11 ml/m  RA Volume:   52.90 ml  27.17 ml/m LA Vol (A4C):   46.2 ml 23.73 ml/m LA Biplane Vol: 58.8 ml 30.20 ml/m  AORTIC VALVE                     PULMONIC VALVE AV Area (Vmax):    1.16 cm      PV Vmax:       1.05 m/s AV Area (Vmean):   1.08 cm      PV Peak grad:  4.4 mmHg AV Area (VTI):     1.31 cm AV Vmax:           264.25 cm/s AV Vmean:          175.250 cm/s AV VTI:            0.608 m AV Peak Grad:      27.9 mmHg AV Mean Grad:      14.0 mmHg LVOT Vmax:         97.90 cm/s LVOT Vmean:        60.100 cm/s LVOT VTI:          0.253 m LVOT/AV VTI ratio: 0.42  AORTA Ao Root diam: 3.00 cm MITRAL VALVE MV Area (PHT): 1.72 cm     SHUNTS MV Area VTI:   2.03 cm     Systemic VTI:  0.25 m MV Peak grad:  5.7 mmHg     Systemic Diam: 2.00 cm MV Mean grad:  2.0 mmHg MV Vmax:       1.19 m/s MV Vmean:      57.0 cm/s MV Decel Time: 441 msec MV E velocity: 66.40 cm/s MV A velocity: 108.00 cm/s MV E/A ratio:  0.61 Candee Furbish MD Electronically signed by Candee Furbish MD Signature Date/Time: 07/28/2021/2:58:35 PM    Final    ECHO TEE  Result Date: 07/29/2021    TRANSESOPHOGEAL ECHO REPORT   Patient Name:   SEICHI KAUFHOLD Date of Exam: 07/29/2021 Medical Rec #:  [id number]         Height:       71.0 in Accession #:    [id number]        Weight:       165.8 lb Date of Birth:  March 28, 1933        BSA:          1.947 m Patient Age:    4  years          BP:           220/63 mmHg Patient Gender: M                 HR:           70 bpm. Exam Location:  [facility] Procedure: Transesophageal Echo, Cardiac Doppler and Color Doppler Indications:    Stroke  History:        Patient has prior history of Echocardiogram examinations, most                 recent 07/28/2021.  CAD, COPD, Arrythmias:PVC; Risk                 Factors:Hypertension, Diabetes, Dyslipidemia and Current Smoker.  Sonographer:    Alvino Chapel RCS Referring Phys: [phone] [place] [facility]: After discussion of the risks and benefits of a TEE, an informed consent was obtained from the patient. TEE procedure time was 15 minutes. The transesophogeal probe was passed without difficulty through the esophogus of the patient. Sedation performed by different physician. The patient was monitored while under deep sedation. Anesthestetic sedation was provided intravenously by Anesthesiology: 250mg  of Propofol, 50mg  of Lidocaine. Image quality was excellent. The patient's vital signs; including heart rate, blood pressure, and oxygen saturation; remained stable throughout the procedure. The patient developed no complications during the procedure. IMPRESSIONS  1. Left ventricular ejection fraction, by estimation, is 60 to 65%. The left ventricle has normal function.  2. Right ventricular systolic function is normal. The right ventricular size is normal.  3. No left atrial/left atrial appendage thrombus was detected. The LAA emptying velocity was 90 cm/s.  4. The mitral valve is grossly normal. Trivial mitral valve regurgitation. No evidence of mitral stenosis.  5. The aortic valve is tricuspid. There is moderate calcification of the aortic valve. There is moderate thickening of the aortic valve. Aortic valve regurgitation is not visualized. Moderate aortic valve stenosis. Aortic valve area, by VTI measures 1.31 cm. Aortic valve mean gradient measures 23.3 mmHg. Aortic valve Vmax measures 3.10 m/s.  6. There is Moderate (Grade III) layered plaque involving the descending aorta and transverse aorta.  7. Agitated saline contrast bubble study was negative, with no evidence of any interatrial shunt. Conclusion(s)/Recommendation(s): No LA/LAA thrombus identified. Negative bubble study for interatrial shunt. No intracardiac source  of embolism detected on this on this transesophageal echocardiogram. FINDINGS  Left Ventricle: Left ventricular ejection fraction, by estimation, is 60 to 65%. The left ventricle has normal function. The left ventricular internal cavity size was normal in size. Right Ventricle: The right ventricular size is normal. No increase in right ventricular wall thickness. Right ventricular systolic function is normal. Left Atrium: Left atrial size was normal in size. No left atrial/left atrial appendage thrombus was detected. The LAA emptying velocity was 90 cm/s. Right Atrium: Right atrial size was normal in size. Prominent Eustachian valve. Pericardium: Trivial pericardial effusion is present. Mitral Valve: The mitral valve is grossly normal. Trivial mitral valve regurgitation. No evidence of mitral valve stenosis. Tricuspid Valve: The tricuspid valve is grossly normal. Tricuspid valve regurgitation is mild . No evidence of tricuspid stenosis. Aortic Valve: The aortic valve is tricuspid. There is moderate calcification of the aortic valve. There is moderate thickening of the aortic valve. Aortic valve regurgitation is not visualized. Moderate aortic stenosis is present. Aortic valve mean gradient measures 23.3 mmHg. Aortic valve peak gradient measures 38.5 mmHg. Aortic valve area,  by VTI measures 1.31 cm. Pulmonic Valve: The pulmonic valve was grossly normal. Pulmonic valve regurgitation is not visualized. No evidence of pulmonic stenosis. Aorta: The aortic root and ascending aorta are structurally normal, with no evidence of dilitation. There is moderate (Grade III) layered plaque involving the descending aorta and transverse aorta. Venous: The left upper pulmonary vein, left lower pulmonary vein, right lower pulmonary vein and right upper pulmonary vein are normal. IAS/Shunts: No atrial level shunt detected by color flow Doppler. Agitated saline contrast was given intravenously to evaluate for intracardiac shunting.  Agitated saline contrast bubble study was negative, with no evidence of any interatrial shunt.  LEFT VENTRICLE PLAX 2D LVOT diam:     2.20 cm LV SV:         86 LV SV Index:   44 LVOT Area:     3.80 cm  AORTIC VALVE AV Area (Vmax):    1.53 cm AV Area (Vmean):   1.52 cm AV Area (VTI):     1.31 cm AV Vmax:           310.36 cm/s AV Vmean:          231.530 cm/s AV VTI:            0.660 m AV Peak Grad:      38.5 mmHg AV Mean Grad:      23.3 mmHg LVOT Vmax:         124.89 cm/s LVOT Vmean:        92.445 cm/s LVOT VTI:          0.227 m LVOT/AV VTI ratio: 0.34  AORTA Ao Root diam: 3.48 cm Ao Asc diam:  3.36 cm TRICUSPID VALVE TR Peak grad:   5.8 mmHg TR Vmax:        120.00 cm/s  SHUNTS Systemic VTI:  0.23 m Systemic Diam: 2.20 cm Eleonore Chiquito MD Electronically signed by Eleonore Chiquito MD Signature Date/Time: 07/29/2021/4:27:23 PM    Final    CT HEAD CODE STROKE WO CONTRAST  Result Date: 07/27/2021 CLINICAL DATA:  Code stroke. Left arm weakness starting at 9:30 this morning EXAM: CT HEAD WITHOUT CONTRAST TECHNIQUE: Contiguous axial images were obtained from the base of the skull through the vertex without intravenous contrast. COMPARISON:  CT head 10/27/2018, brain MRI 03/13/2021 FINDINGS: Brain: There is no evidence of acute intracranial hemorrhage, extra-axial fluid collection, or acute infarct. There is mild parenchymal volume loss. Confluent hypodensity throughout the subcortical and periventricular white matter likely reflects sequela of advanced chronic white matter microangiopathy. The ventricles are stable in size. There is no mass lesion.  There is no midline shift. Vascular: There is calcification of the bilateral cavernous ICAs and vertebral arteries. There is no dense vessel. Skull: Normal. Negative for fracture or focal lesion. Sinuses/Orbits: The paranasal sinuses are clear. Bilateral lens implants are in place. The globes and orbits are otherwise unremarkable. Other: A left mastoid effusion is again  seen. ASPECTS [facility] Stroke Program Early CT Score) - Ganglionic level infarction (caudate, lentiform nuclei, internal capsule, insula, M1-M3 cortex): 7 - Supraganglionic infarction (M4-M6 cortex): 3 Total score (0-10 with 10 being normal): 10 IMPRESSION: 1. No acute intracranial pathology. 2. ASPECTS is 10 3. Mild parenchymal volume loss and advanced chronic white matter microangiopathy. These results were called by telephone at the time of interpretation on 07/27/2021 at 12:27 pm to provider [facility] , who verbally acknowledged these results. Electronically Signed   By: Valetta Mole M.D.   On: 07/27/2021 12:27  CT ANGIO HEAD NECK W WO CM (CODE STROKE)  Result Date: 07/27/2021 CLINICAL DATA:  Left-sided weakness EXAM: CT ANGIOGRAPHY HEAD AND NECK TECHNIQUE: Multidetector CT imaging of the head and neck was performed using the standard protocol during bolus administration of intravenous contrast. Multiplanar CT image reconstructions and MIPs were obtained to evaluate the vascular anatomy. Carotid stenosis measurements (when applicable) are obtained utilizing NASCET criteria, using the distal internal carotid diameter as the denominator. CONTRAST:  19mL OMNIPAQUE IOHEXOL 350 MG/ML SOLN COMPARISON:  Same-day noncontrast CT head, MRA head/neck 03/13/2021 FINDINGS: CTA NECK FINDINGS Aortic arch: There is calcified atherosclerotic plaque of the aortic arch and in the proximal great vessels. There is multifocal irregularity of the right subclavian artery without high-grade stenosis or occlusion. There is no evidence of dissection or aneurysm. Right carotid system: There is scattered calcified atherosclerotic plaque in the right common carotid artery without hemodynamically significant stenosis. There is soft and calcified atherosclerotic plaque of the proximal right internal carotid artery resulting in up to 30-40% stenosis. There is no evidence of dissection or aneurysm. Left carotid system: There is mild  calcified atherosclerotic plaque in the left common carotid artery without hemodynamically significant stenosis. There is mild calcified atherosclerotic plaque in the proximal left internal carotid artery without hemodynamically significant stenosis or occlusion. There is no dissection or aneurysm. Vertebral arteries: There is mild calcified atherosclerotic plaque in the proximal right vertebral artery and at the V2/V3 junction without hemodynamically significant stenosis or occlusion. The remainder of the right vertebral artery is patent. There is no dissection or aneurysm. The left vertebral artery is2 smaller than the right, a normal variant. There is no hemodynamically significant stenosis or occlusion. There is no dissection or aneurysm. Skeleton: There is multilevel degenerative change of the cervical spine, most advanced at C5-C6. Other neck: There is a subcentimeter left thyroid nodule. Small bilateral palatine tonsilliths are noted. There is a 1.0 cm enhancing lesion in the right parotid gland. Upper chest: There is emphysema in the lung apices. There is cavitation in the medial left upper lobe, incompletely imaged but also present on the prior CT chest from 12/21/2018. Review of the MIP images confirms the above findings CTA HEAD FINDINGS Anterior circulation: There is calcified atherosclerotic plaque of the bilateral intracranial carotid arteries resulting in mild to moderate stenosis on the right and mild stenosis on the left, similar to the prior study. There is no evidence of occlusion. The bilateral MCAs are patent. The bilateral ACAs are patent. There is no aneurysm. Posterior circulation: There is mild calcified atherosclerotic plaque of the right V4 segment without hemodynamically significant stenosis or occlusion. The left V4 segment is patent. PICA is patent bilaterally. There is mild focal stenosis of the the proximal left P2 segment (8-117), similar to the prior study. Basilar artery is patent.  The bilateral PCAs are patent. There is no aneurysm. Venous sinuses: Patent. Anatomic variants: None. Review of the MIP images confirms the above findings IMPRESSION: 1. Mild atherosclerotic plaque in the bilateral carotid bulbs resulting in up to 30-40% stenosis on the right. Patent vertebral arteries. 2. Calcified atherosclerotic plaque of the bilateral intracranial ICAs resulting in mild to moderate stenosis on the right and mild stenosis on the left, not significantly changed. 3. Mild focal stenosis of the proximal left P2 segment. Otherwise, patent intracranial vasculature. 4. 1.0 cm enhancing lesion in the right parotid gland is indeterminate with differential including both benign and malignant etiologies. Recommend ENT referral. Aortic Atherosclerosis (ICD10-I70.0) and Emphysema (ICD10-J43.9). Electronically  Signed   By: Valetta Mole M.D.   On: 07/27/2021 13:19    Subjective:   Patient was seen and examined 07/31/2021, 7:49 AM Patient stable today. No acute distress.  No issues overnight Stable for discharge.... Continues to have left-sided weakness with no progressive changes  Discharge Exam:    Vitals:   07/30/21 1256 07/30/21 2021 07/30/21 2140 07/31/21 0541  BP: (!) 165/68 (!) (P) 170/76 (!) 160/67 (!) 184/86  Pulse: 67 (P) 65 64 77  Resp: 16 (P) 16 20 20   Temp: 98.6 F (37 C) (P) 98.5 F (36.9 C) 98.4 F (36.9 C) 98 F (36.7 C)  TempSrc:   Oral Oral  SpO2: 100% (P) 99% 96% 98%  Weight:      Height:        General: Pt lying comfortably in bed & appears in no obvious distress. Cardiovascular: S1 & S2 heard, RRR, S1/S2 +. No murmurs, rubs, gallops or clicks. No JVD or pedal edema. Respiratory: Clear to auscultation without wheezing, rhonchi or crackles. No increased work of breathing. Abdominal:  Non-distended, non-tender & soft. No organomegaly or masses appreciated. Normal bowel sounds heard. CNS: Alert and oriented. No focal deficits.  Left-sided weakness, speech intact,  cognition intact Extremities: no edema, no cyanosis      The results of significant diagnostics from this hospitalization (including imaging, microbiology, ancillary and laboratory) are listed below for reference.      Microbiology:   Recent Results (from the past 240 hour(s))  Resp Panel by RT-PCR (Flu A&B, Covid) Nasopharyngeal Swab     Status: None   Collection Time: 07/27/21  2:04 PM   Specimen: Nasopharyngeal Swab; Nasopharyngeal(NP) swabs in vial transport medium  Result Value Ref Range Status   SARS Coronavirus 2 by RT PCR NEGATIVE NEGATIVE Final    Comment: (NOTE) SARS-CoV-2 target nucleic acids are NOT DETECTED.  The SARS-CoV-2 RNA is generally detectable in upper respiratory specimens during the acute phase of infection. The lowest concentration of SARS-CoV-2 viral copies this assay can detect is 138 copies/mL. A negative result does not preclude SARS-Cov-2 infection and should not be used as the sole basis for treatment or other patient management decisions. A negative result may occur with  improper specimen collection/handling, submission of specimen other than nasopharyngeal swab, presence of viral mutation(s) within the areas targeted by this assay, and inadequate number of viral copies(<138 copies/mL). A negative result must be combined with clinical observations, patient history, and epidemiological information. The expected result is Negative.  Fact Sheet for Patients:  EntrepreneurPulse.com.au  Fact Sheet for Healthcare Providers:  IncredibleEmployment.be  This test is no t yet approved or cleared by the [place] FDA and  has been authorized for detection and/or diagnosis of SARS-CoV-2 by FDA under an Emergency Use Authorization (EUA). This EUA will remain  in effect (meaning this test can be used) for the duration of the COVID-19 declaration under Section 564(b)(1) of the Act, 21 U.S.C.section 360bbb-3(b)(1),  unless the authorization is terminated  or revoked sooner.       Influenza A by PCR NEGATIVE NEGATIVE Final   Influenza B by PCR NEGATIVE NEGATIVE Final    Comment: (NOTE) The Xpert Xpress SARS-CoV-2/FLU/RSV plus assay is intended as an aid in the diagnosis of influenza from Nasopharyngeal swab specimens and should not be used as a sole basis for treatment. Nasal washings and aspirates are unacceptable for Xpert Xpress SARS-CoV-2/FLU/RSV testing.  Fact Sheet for Patients: EntrepreneurPulse.com.au  Fact Sheet for Healthcare Providers: IncredibleEmployment.be  This test is not yet approved or cleared by the [place] and has been authorized for detection and/or diagnosis of SARS-CoV-2 by FDA under an Emergency Use Authorization (EUA). This EUA will remain in effect (meaning this test can be used) for the duration of the COVID-19 declaration under Section 564(b)(1) of the Act, 21 U.S.C. section 360bbb-3(b)(1), unless the authorization is terminated or revoked.  Performed at [facility], [address] [place], [place] [address]      Labs:   CBC: Recent Labs  Lab 07/27/21 1231 07/27/21 1232 07/28/21 0548 07/29/21 0521  WBC 9.2  --  7.1 6.6  NEUTROABS 6.9  --   --   --   HGB 15.8 16.0 14.5 14.2  HCT 46.4 47.0 45.0 42.9  MCV 96.7  --  96.6 97.7  PLT 210  --  193 976   Basic Metabolic Panel: Recent Labs  Lab 07/27/21 1231 07/27/21 1232 07/28/21 0548 07/29/21 0521  NA 136 138 139 138  K 3.7 3.7 3.2* 3.9  CL 100 100 103 103  CO2 27  --  28 29  GLUCOSE 174* 173* 148* 161*  BUN 24* 22 22 25*  CREATININE 1.36* 1.30* 1.13 1.24  CALCIUM 9.4  --  9.0 8.8*  MG  --   --  1.9 2.0   Liver Function Tests: Recent Labs  Lab 07/27/21 1231  AST 25  ALT 17  ALKPHOS 49  BILITOT 0.5  PROT 7.9  ALBUMIN 4.4   BNP (last 3 results) No results for input(s): BNP in the last 8760 hours. Cardiac Enzymes: No results for input(s):  CKTOTAL, CKMB, CKMBINDEX, TROPONINI in the last 168 hours. CBG: Recent Labs  Lab 07/30/21 0722 07/30/21 1106 07/30/21 1705 07/30/21 2144 07/31/21 0722  GLUCAP 157* 186* 211* 97 164*   Hgb A1c No results for input(s): HGBA1C in the last 72 hours.  Lipid Profile No results for input(s): CHOL, HDL, LDLCALC, TRIG, CHOLHDL, LDLDIRECT in the last 72 hours.  Thyroid function studies No results for input(s): TSH, T4TOTAL, T3FREE, THYROIDAB in the last 72 hours.  Invalid input(s): FREET3 Anemia work up No results for input(s): VITAMINB12, FOLATE, FERRITIN, TIBC, IRON, RETICCTPCT in the last 72 hours. Urinalysis    Component Value Date/Time   COLORURINE STRAW (A) 07/27/2021 1300   APPEARANCEUR CLEAR 07/27/2021 1300   LABSPEC 1.012 07/27/2021 1300   PHURINE 7.0 07/27/2021 1300   GLUCOSEU NEGATIVE 07/27/2021 1300   HGBUR SMALL (A) 07/27/2021 1300   BILIRUBINUR NEGATIVE 07/27/2021 1300   KETONESUR NEGATIVE 07/27/2021 1300   PROTEINUR 100 (A) 07/27/2021 1300   UROBILINOGEN 0.2 09/12/2009 1019   NITRITE NEGATIVE 07/27/2021 1300   LEUKOCYTESUR NEGATIVE 07/27/2021 1300         Time coordinating discharge: Over 45 minutes  SIGNED: Deatra James, MD, FACP, FHM. [facility],  Please use [email or web address] to Page If 7PM-7AM, please contact night-coverage [email or web address] [facility] 07/31/2021, 7:49 AM

## 2021-07-31 NOTE — Telephone Encounter (Signed)
14 day Zio placed, instructions given to patient and wife.

## 2021-07-31 NOTE — Progress Notes (Signed)
Jamse Arn, MD   Physician  Physical Medicine and Rehabilitation  PMR Pre-admission      Signed  Date of Service:  07/30/2021  8:49 AM       Related encounter: ED to Hosp-Admission (Discharged) from 07/27/2021 in [place]      Show:Clear all [x] Written[x] Templated[] Copied  Added by: [x] Cristina Gong, RN[x] Jamse Arn, MD  [] Hover for details                                                                                                                                                                                                                                                                                                                                                                                                                                              PMR Admission Coordinator Pre-Admission Assessment   Patient: Barry [name] is an 87 y.o., male MRN: [id number] DOB: 01-19-1933 Height: 5\' 11"  (180.3 cm) Weight: 75.2 kg   Insurance Information HMO:     PPO:      PCP:      IPA:      80/20:      OTHER:  PRIMARY: Medicare a and b      Policy#: [id number]      Subscriber: pt Benefits:  Phone passport once source  Name: 10/13 Eff. Date: a and b 07/18/2001     Deduct: $1556      Out of Pocket Max: none      Life Max: none CIR: 100%      SNF: 20 full days Outpatient: 80%     Co-Pay: 20% Home Health: 100%      Co-Pay: none DME: 80%     Co-Pay: 205 Providers: pt choice  SECONDARY: [employer]      Policy#: [id number]   Financial Counselor:       Phone#:    The "Data Collection Information Summary" for patients in Inpatient Rehabilitation Facilities with attached "Privacy Act [place] Records" was provided and verbally reviewed with: Family   Emergency  Contact Information Contact Information       Name Relation Home Work Mobile    [place] Son [phone]             Current Medical History  Patient Admitting Diagnosis: CVA   History of Present Illness: 87 year old male with medical history of CAD, COPD, type 2 DM, HTN, GERD, allergic rhinitis and protein calore malnutrition. Presented on 07/27/2021 with complaints of weakness and vertigo. He fell on his porch and noted legs were weak bilaterally. He recently had MRA of the head and neck performed by his PCP to evaluate for posterior circulation insufficiency which was negative.    MRI shows multiple small infarcts involving the posterior limb of th internal capsul, thalamic mucus and centrum semi ovale/corona radiata on the right side. There is also a couple seen on the left side involving the basal ganglia. Remote hemorrhages noted involving the left frontal area. Also remote infarct involving the inferior left cerebellum and basal ganglia. Felt cardioembolic in origin. TTE with o acute findings and LVEF 60 - 65%. TEE 10/12 essentially within normal with exception of moderate aortic stenosis negative for clots. Neurology recommended dual antiplatelet treatment. 30 day Holter monitor. Hgb A1c 6.7% and LDL 84.    Right sided parotid mass incidental fining with will require ENT follow up as an outpatient. Aortic stenosis moderate finding on TEE. Recommend close monitoring with cardiologist as an outpatient. CKD stage IIIa baseline creatinine 1.1-3. HTN and allowing for permissive HTN. BP remained elevated so home meds of HCTZ and metoprolol changed to Norvasc and Toprol XL. COPD with ongoing tobacco abuse. Counseled on cessation and bronchodilators as needed. Type 2 Diabetes with Hgb A1c 6.7 %, resume diabetic diet, home med of Amaryl.   Complete NIHSS TOTAL: 7   Patient's medical record from [facility] has been reviewed by the rehabilitation admission coordinator and physician.    Past Medical History      Past Medical History:  Diagnosis Date   AAA (abdominal aortic aneurysm) 3-09    4.7 cm   Allergy      rhinitis   Anxiety     Cancer ([place])      skin   Cataract     Depression     Diabetes mellitus     Diverticulosis     GERD (gastroesophageal reflux disease)     Hepatitis     Hx of colonic polyps 08/26/2015   Hyperlipidemia     Hypertension     Nephrolithiasis     Personal history of colonic polyps      adenomas, serrated also   PVD (peripheral vascular disease) ([place])     Tobacco abuse      Has the patient had major surgery  during 100 days prior to admission? No   Family History   family history includes Cancer in his brother and brother; Depression in his brother and sister; Diabetes in his sister; Emphysema in his father; Heart disease in his mother; Kidney disease in his sister; Learning disabilities in his sister; Lung disease in his sister; Mental retardation in his sister.   Current Medications   Current Facility-Administered Medications:    acetaminophen (TYLENOL) tablet 650 mg, 650 mg, Oral, Q6H PRN, 650 mg at 07/31/21 0105 **OR** acetaminophen (TYLENOL) suppository 650 mg, 650 mg, Rectal, Q6H PRN, O'Neal, Cassie Freer, MD   amLODipine (NORVASC) tablet 5 mg, 5 mg, Oral, Daily, Shahmehdi, Seyed A, MD, 5 mg at 07/31/21 0827   aspirin EC tablet 81 mg, 81 mg, Oral, q morning, O'Neal, Cassie Freer, MD, 81 mg at 07/31/21 0827   clonazePAM (KLONOPIN) disintegrating tablet 0.25 mg, 0.25 mg, Oral, TID AC & HS, O'Neal, Cassie Freer, MD, 0.25 mg at 07/31/21 9030   [COMPLETED] clopidogrel (PLAVIX) tablet 300 mg, 300 mg, Oral, Once, 300 mg at 07/27/21 1310 **AND** clopidogrel (PLAVIX) tablet 75 mg, 75 mg, Oral, Daily, O'Neal, Cassie Freer, MD, 75 mg at 07/31/21 0827   ezetimibe (ZETIA) tablet 10 mg, 10 mg, Oral, Daily, O'Neal, Cassie Freer, MD, 10 mg at 07/31/21 0827   FLUoxetine (PROZAC) capsule 20 mg, 20 mg, Oral, Daily, O'Neal, Cassie Freer, MD,  20 mg at 07/31/21 0827   heparin injection 5,000 Units, 5,000 Units, Subcutaneous, Q8H, O'Neal, Cassie Freer, MD, 5,000 Units at 07/31/21 0659   hydrALAZINE (APRESOLINE) injection 10 mg, 10 mg, Intravenous, Q6H PRN, Audie Box, Cassie Freer, MD, 10 mg at 07/30/21 0923   HYDROcodone-acetaminophen (NORCO/VICODIN) 5-325 MG per tablet 1 tablet, 1 tablet, Oral, Q6H PRN, O'Neal, Cassie Freer, MD, 1 tablet at 07/30/21 0416   influenza vaccine adjuvanted (FLUAD) injection 0.5 mL, 0.5 mL, Intramuscular, Tomorrow-1000, Shah, Pratik D, DO   insulin aspart (novoLOG) injection 0-5 Units, 0-5 Units, Subcutaneous, QHS, O'Neal, Cassie Freer, MD   insulin aspart (novoLOG) injection 0-9 Units, 0-9 Units, Subcutaneous, TID WC, O'Neal, Cassie Freer, MD, 2 Units at 07/31/21 0828   ipratropium-albuterol (DUONEB) 0.5-2.5 (3) MG/3ML nebulizer solution 3 mL, 3 mL, Nebulization, Q6H PRN, O'Neal, Cassie Freer, MD   loratadine (CLARITIN) tablet 10 mg, 10 mg, Oral, Daily, O'Neal, Cassie Freer, MD, 10 mg at 07/31/21 0827   metoprolol succinate (TOPROL-XL) 24 hr tablet 25 mg, 25 mg, Oral, Daily, Shahmehdi, Seyed A, MD, 25 mg at 07/31/21 0827   montelukast (SINGULAIR) tablet 10 mg, 10 mg, Oral, QHS, O'Neal, Cassie Freer, MD, 10 mg at 07/30/21 2220   ondansetron (ZOFRAN) tablet 4 mg, 4 mg, Oral, Q6H PRN **OR** ondansetron (ZOFRAN) injection 4 mg, 4 mg, Intravenous, Q6H PRN, O'Neal, Cassie Freer, MD   pantoprazole (PROTONIX) EC tablet 40 mg, 40 mg, Oral, Daily, O'Neal, Cassie Freer, MD, 40 mg at 07/31/21 0827   pneumococcal 23 valent vaccine (PNEUMOVAX-23) injection 0.5 mL, 0.5 mL, Intramuscular, Tomorrow-1000, Shah, Pratik D, DO   pregabalin (LYRICA) capsule 50 mg, 50 mg, Oral, TID, Shahmehdi, Seyed A, MD, 50 mg at 07/31/21 0827   simvastatin (ZOCOR) tablet 40 mg, 40 mg, Oral, QHS, O'Neal, Cassie Freer, MD, 40 mg at 07/30/21 2220   vitamin B-12 (CYANOCOBALAMIN) tablet 1,000 mcg, 1,000 mcg, Oral, Daily, O'Neal, Cassie Freer,  MD, 1,000 mcg at 07/31/21 3007   Patients Current Diet:  Diet Order  Diet - low sodium heart healthy             Diet regular Room service appropriate? Yes; Fluid consistency: Thin  Diet effective now                       Precautions / Restrictions Precautions Precautions: Fall Restrictions Weight Bearing Restrictions: No    Has the patient had 2 or more falls or a fall with injury in the past year? Yes   Prior Activity Level Community (5-7x/wk): Independent and driving, living alone   Prior Functional Level Self Care: Did the patient need help bathing, dressing, using the toilet or eating? Independent   Indoor Mobility: Did the patient need assistance with walking from room to room (with or without device)? Independent   Stairs: Did the patient need assistance with internal or external stairs (with or without device)? Independent   Functional Cognition: Did the patient need help planning regular tasks such as shopping or remembering to take medications? Independent   Patient Information Are you of Hispanic, Latino/a,or Spanish origin?: A. No, not of Hispanic, Latino/a, or Spanish origin What is your race?: A. White Do you need or want an interpreter to communicate with a doctor or health care staff?: 0. No   Patient's Response To:  Health Literacy and Transportation Is the patient able to respond to health literacy and transportation needs?: Yes Health Literacy - How often do you need to have someone help you when you read instructions, pamphlets, or other written material from your doctor or pharmacy?: Never In the past 12 months, has lack of transportation kept you from medical appointments or from getting medications?: No In the past 12 months, has lack of transportation kept you from meetings, work, or from getting things needed for daily living?: No   Development worker, international aid / [place] Devices/Equipment: None Home Equipment:  Environmental consultant - 2 wheels, [place] - single point, Wheelchair - manual   Prior Device Use: Indicate devices/aids used by the patient prior to current illness, exacerbation or injury? None of the above   Current Functional Level Cognition   Overall Cognitive Status: Within Functional Limits for tasks assessed Orientation Level: Oriented X4    Extremity Assessment (includes Sensation/Coordination)   Upper Extremity Assessment: Defer to OT evaluation LUE Deficits / Details: 3/5 shoulder MMT; elbow 4/5 flexion, 4/5 grip; 4-/5 wrist flexion, 4+/ wrist extension. Slow pace for sequential finger touching. Difficulty with proprioceptive task of touching nose with eyes closed. LUE Sensation: decreased proprioception LUE Coordination: decreased fine motor, decreased gross motor  Lower Extremity Assessment: Generalized weakness, LLE deficits/detail LLE Deficits / Details: grossly 3+/5 except anke dorsiflexion 0/5 LLE Sensation: WNL LLE Coordination: decreased gross motor     ADLs   Overall ADL's : Needs assistance/impaired Eating/Feeding: Sitting, Minimal assistance Grooming: Sitting, Minimal assistance, Moderate assistance Upper Body Bathing: Minimal assistance, Sitting, Min guard Lower Body Bathing: Moderate assistance, Maximal assistance, Sitting/lateral leans Upper Body Dressing : Min guard, Minimal assistance, Sitting Lower Body Dressing: Moderate assistance, Maximal assistance, Sitting/lateral leans Toilet Transfer: Maximal assistance, RW, Stand-pivot Toilet Transfer Details (indicate cue type and reason): Simulated via EOB to chair transfer with RW Toileting- Clothing Manipulation and Hygiene: Maximal assistance, Sitting/lateral lean Tub/ Shower Transfer: Maximal assistance, Stand-pivot, Rolling walker, Tub bench Functional mobility during ADLs: Maximal assistance, Rolling walker General ADL Comments: All levels taken per clinical judgment based on bed mobility, EOB to chair transfer, and UE  assessment.  Mobility   Overal bed mobility: Needs Assistance Bed Mobility: Supine to Sit Supine to sit: Mod assist, Max assist General bed mobility comments: as per OT notes     Transfers   Overall transfer level: Needs assistance Equipment used: Rolling walker (2 wheeled) Transfers: Stand Pivot Transfers, Sit to/from Stand Sit to Stand: Max assist Stand pivot transfers: Max assist General transfer comment: as per OT notes     Ambulation / Gait / Stairs / Wheelchair Mobility   Ambulation/Gait Ambulation/Gait assistance: Max Web designer (Feet): 4 Feet Assistive device: Rolling walker (2 wheeled) Gait Pattern/deviations: Decreased step length - right, Decreased step length - left, Decreased stance time - left, Decreased stride length, Decreased dorsiflexion - left, Shuffle General Gait Details: limited to a few slow labored side steps with frequent buckling of left knee, poor coordination, required Max tactile assistance to shuffle LLE when attempting steps Gait velocity: slow     Posture / Balance Dynamic Sitting Balance Sitting balance - Comments: sitting at EOB Balance Overall balance assessment: Needs assistance Sitting-balance support: Feet supported, No upper extremity supported Sitting balance-Leahy Scale: Poor Sitting balance - Comments: sitting at EOB Postural control: Left lateral lean Standing balance support: During functional activity, Bilateral upper extremity supported Standing balance-Leahy Scale: Poor Standing balance comment: using RW     Special needs/care consideration Hgb A1c 6.7 % Will need 30 day Holter monitor [id number] day placed on 10/14    Previous Home Environment  Living Arrangements: Alone  Lives With: Alone Available Help at Discharge: Family, Available 24 hours/day (son will hire caregivers when he works) Type of Home: Mobile home Home Layout: One level Home Access: Stairs to enter Entrance Stairs-Rails: Right, Left, Can reach  both Entrance Stairs-Number of Steps: 3 Bathroom Shower/Tub: Optometrist: Yes Home Care Services: No Additional Comments: son and dtr in law live in [place] and work so they will hire 24/7 assist when they work   Discharge Living Setting Plans for Discharge Living Setting: Lives with (comment) (to stay with son and his wife in [place]) Type of Home at Discharge: House Discharge Home Layout: Two level, 1/2 bath on main level, Other (Comment), Bed/bath upstairs (son to set up room for bedroom downstairs) Alternate Level Stairs-Number of Steps: 15 Discharge Home Access: Stairs to enter Entrance Stairs-Rails: None Entrance Stairs-Number of Steps: 3 to 4 Discharge Bathroom Shower/Tub: Walk-in shower, Tub/shower unit (full baths are upstairs; may get stairlift for upper level access) Discharge Bathroom Toilet: Standard Discharge Bathroom Accessibility: Yes How Accessible: Accessible via walker Does the patient have any problems obtaining your medications?: No   Son's home address [address] [place], [place]   Social/Family/Support Systems Patient Roles: Parent Contact Information: Writer Anticipated Caregiver: son, daughter in Sports coach and hired caregivers Anticipated Caregiver's Contact Information: [phone] Ability/Limitations of Caregiver: son and his wife work 8 am until 5; will hire caregivers Caregiver Availability: 24/7 Discharge Plan Discussed with Primary Caregiver: Yes Is Caregiver In Agreement with Plan?: Yes Does Caregiver/Family have Issues with Lodging/Transportation while Pt is in Rehab?: No   Goals Patient/Family Goal for Rehab: supervision to min asisst with PT, and OT, supervision with SLP Expected length of stay: ELOS 10 to 14 days Pt/Family Agrees to Admission and willing to participate: Yes Program Orientation Provided & Reviewed with Pt/Caregiver Including Roles  & Responsibilities: Yes   Decrease  burden of Care through IP rehab admission: n/a   Possible need for SNF placement upon discharge:  not anticipated   Patient Condition: I have reviewed medical records from [facility] , spoken with CM, and patient, son, and family member. I discussed via phone for inpatient rehabilitation assessment.  Patient will benefit from ongoing PT, OT, and SLP, can actively participate in 3 hours of therapy a day 5 days of the week, and can make measurable gains during the admission.  Patient will also benefit from the coordinated team approach during an Inpatient Acute Rehabilitation admission.  The patient will receive intensive therapy as well as Rehabilitation physician, nursing, social worker, and care management interventions.  Due to bladder management, bowel management, safety, skin/wound care, disease management, medication administration, pain management, and patient education the patient requires 24 hour a day rehabilitation nursing.  The patient is currently mod to max assist with mobility and basic ADLs.  Discharge setting and therapy post discharge at home with home health is anticipated.  Patient has agreed to participate in the Acute Inpatient Rehabilitation Program and will admit today.   Preadmission Screen Completed By:  Cleatrice Burke, 07/31/2021 10:04 AM ______________________________________________________________________   Discussed status with Dr. Posey Pronto on 07/31/2021 at 1007 and received approval for admission today.   Admission Coordinator:  Cleatrice Burke, RN, time  1007 Date  07/31/2021    Assessment/Plan: Diagnosis: CVA Does the need for close, 24 hr/day Medical supervision in concert with the patient's rehab needs make it unreasonable for this patient to be served in a less intensive setting? Yes Co-Morbidities requiring supervision/potential complications: CAD, COPD, type 2 DM, HTN, GERD, allergic rhinitis and protein calore malnutrition Due to safety,  disease management, medication administration, and patient education, does the patient require 24 hr/day rehab nursing? Yes Does the patient require coordinated care of a physician, rehab nurse, PT, OT, and SLP to address physical and functional deficits in the context of the above medical diagnosis(es)? Yes Addressing deficits in the following areas: balance, endurance, locomotion, strength, transferring, bathing, dressing, toileting, and psychosocial support Can the patient actively participate in an intensive therapy program of at least 3 hrs of therapy 5 days a week? Yes The potential for patient to make measurable gains while on inpatient rehab is excellent Anticipated functional outcomes upon discharge from inpatient rehab: min assist and mod assist PT, min assist and mod assist OT, supervision SLP Estimated rehab length of stay to reach the above functional goals is: 13-16 days. Anticipated discharge destination: Home 10. Overall Rehab/Functional Prognosis: good     MD Signature: Delice Lesch, MD, ABPMR         Revision History                                         Note Details  Author Jamse Arn, MD File Time 07/31/2021 10:47 AM  Author Type Physician Status Signed  Last Editor Jamse Arn, MD Service Physical Medicine and [place] # [id number] Admit Date 07/31/2021

## 2021-07-31 NOTE — Progress Notes (Signed)
Patient discharged to [place], transported by [facility]. Paperwork given to [employer] . Belongings sent with patient. Patient stable upon discharge.

## 2021-07-31 NOTE — H&P (Signed)
Physical Medicine and Rehabilitation Admission H&P    Chief Complaint  Patient presents with   Weakness  : HPI: Barry [name] is a 87 year old right-handed male with history of CAD maintained on low-dose aspirin, CKD stage III, COPD/tobacco use, type 2 diabetes mellitus, hypertension, GERD.  History taken from patient and chart review.  Patient lives alone.  Mobile home with 3 steps to entry.  Reportedly independent prior to admission using an occasional cane.  He does have a son in the [place] area who works and can provide assistance in the evening.  He presented on 07/27/2021 to [facility] with acute onset of left-sided hemiparesis.  CT/MRI showed acute right posterior lentiform nucleus, thalamus, anterior caudate infarct with additional punctate infarct in the lateral left thalamus and possibly the left lentiform nucleus.  CT angiogram head and neck with mild atherosclerotic plaque of the bilateral carotid bulbs resulting in 30 to 40% stenosis on the right.  1 cm enhancing lesion in the right parotid gland indeterminate recommend ENT follow-up as outpatient.  MRA head showed no significant proximal stenosis aneurysm or branch vessel occlusion.  Echocardiogram with ejection fraction of 60 to 00%, grade 1 diastolic dysfunction.  TEE completed showing no left atrial/left atrial appendage or thrombus detected.  No PFO by bubble.  He currently mains on aspirin and Plavix for CVA prophylaxis.  Subcutaneous heparin for DVT prophylaxis.  Tolerating regular consistency diet.  Therapy evaluations completed due to patient's left hemiparesis was admitted for a comprehensive rehab program.  Please see preadmission assessment from earlier today as well.  Review of Systems  Constitutional:  Negative for chills and fever.  HENT:  Negative for hearing loss.   Eyes:  Negative for blurred vision and double vision.  Respiratory:  Negative for cough.        Shortness of breath with heavy exertion.   Cardiovascular:  Negative for chest pain, palpitations and leg swelling.  Gastrointestinal:  Positive for constipation. Negative for heartburn, nausea and vomiting.       GERD  Genitourinary:  Positive for urgency. Negative for dysuria, flank pain and hematuria.  Musculoskeletal:  Positive for joint pain. Negative for myalgias.  Skin:  Negative for rash.  Neurological:  Positive for focal weakness and weakness.  Psychiatric/Behavioral:  Positive for depression.        Anxiety  All other systems reviewed and are negative. Past Medical History:  Diagnosis Date   AAA (abdominal aortic aneurysm) 3-09   4.7 cm   Allergy    rhinitis   Anxiety    Cancer ([place])    skin   Cataract    Depression    Diabetes mellitus    Diverticulosis    GERD (gastroesophageal reflux disease)    Hepatitis    Hx of colonic polyps 08/26/2015   Hyperlipidemia    Hypertension    Nephrolithiasis    Personal history of colonic polyps    adenomas, serrated also   PVD (peripheral vascular disease) ([place])    Tobacco abuse    Past Surgical History:  Procedure Laterality Date   COLONOSCOPY  2006, 2008, 2010, 05/27/2011   numerous adenomas - 13 in 2006, 3, 2008, 2 2012 (up to 1 cm), 4 diminutive adenomas, serrated adenomas 2012. Diverticulosis and hemorrhoids also.   EYE SURGERY     EYE SURGERY Left    Kidney stone operation     lower limb amputation, other toe 5th     percutaneous stent graft  repair of infrarenal AAA  11/10   5.6 cm (T. early)   surgical excision basal cell carcinoma     tympanic eardrum repair     VASECTOMY     Family History  Problem Relation Age of Onset   Emphysema Father    Cancer Brother        Lung   Depression Brother    Depression Sister    Lung disease Sister        Fibrosis and died from pneumonia on Apr 16, 2013   Kidney disease Sister    Diabetes Sister    Learning disabilities Sister    Mental retardation Sister    Heart disease Mother    Cancer Brother    Colon cancer  Neg Hx    Dementia Neg Hx    Alcohol abuse Neg Hx    Drug abuse Neg Hx    Paranoid behavior Neg Hx    Schizophrenia Neg Hx    Anxiety disorder Neg Hx    Bipolar disorder Neg Hx    OCD Neg Hx    Sexual abuse Neg Hx    Physical abuse Neg Hx    Seizures Neg Hx    Esophageal cancer Neg Hx    Stomach cancer Neg Hx    Rectal cancer Neg Hx    Social History:  reports that he has been smoking cigarettes. He has a 60.00 pack-year smoking history. He has never used smokeless tobacco. He reports that he does not drink alcohol and does not use drugs. Allergies:  Allergies  Allergen Reactions   Lisinopril-Hydrochlorothiazide Swelling    ZESTRIL   Doxycycline Rash   No medications prior to admission.    Drug Regimen Review Drug regimen was reviewed and remains appropriate with no significant issues identified  Home: Home Living Family/patient expects to be discharged to:: Private residence Living Arrangements: Alone Available Help at Discharge: Family, Available 24 hours/day (son will hire caregivers when he works) Type of Home: Mobile home Home Access: Stairs to enter Technical brewer of Steps: 3 Entrance Stairs-Rails: Right, Left, Can reach both [place]: One level Bathroom Shower/Tub: Chiropodist: Standard Bathroom Accessibility: Yes Home Equipment: Environmental consultant - 2 wheels, Cane - single point, Wheelchair - manual Additional Comments: son and dtr in law live in [place] and work so they will hire 24/7 assist when they work  Lives With: Alone   Functional History: Prior Function Level of Independence: Independent Comments: Hydrographic surveyor, drives  Functional Status:  Mobility: Bed Mobility Overal bed mobility: Needs Assistance Bed Mobility: Supine to Sit Supine to sit: Mod assist, Max assist General bed mobility comments: as per OT notes Transfers Overall transfer level: Needs assistance Equipment used: Rolling walker (2 wheeled) Transfers:  Stand Pivot Transfers, Sit to/from Stand Sit to Stand: Max assist Stand pivot transfers: Max assist General transfer comment: as per OT notes Ambulation/Gait Ambulation/Gait assistance: Max Web designer (Feet): 4 Feet Assistive device: Rolling walker (2 wheeled) Gait Pattern/deviations: Decreased step length - right, Decreased step length - left, Decreased stance time - left, Decreased stride length, Decreased dorsiflexion - left, Shuffle General Gait Details: limited to a few slow labored side steps with frequent buckling of left knee, poor coordination, required Max tactile assistance to shuffle LLE when attempting steps Gait velocity: slow    ADL: ADL Overall ADL's : Needs assistance/impaired Eating/Feeding: Sitting, Minimal assistance Grooming: Sitting, Minimal assistance, Moderate assistance Upper Body Bathing: Minimal assistance, Sitting, Min guard Lower Body Bathing: Moderate assistance, Maximal  assistance, Sitting/lateral leans Upper Body Dressing : Min guard, Minimal assistance, Sitting Lower Body Dressing: Moderate assistance, Maximal assistance, Sitting/lateral leans Toilet Transfer: Maximal assistance, RW, Stand-pivot Toilet Transfer Details (indicate cue type and reason): Simulated via EOB to chair transfer with RW Toileting- Clothing Manipulation and Hygiene: Maximal assistance, Sitting/lateral lean Tub/ Shower Transfer: Maximal assistance, Stand-pivot, Rolling walker, Tub bench Functional mobility during ADLs: Maximal assistance, Rolling walker General ADL Comments: All levels taken per clinical judgment based on bed mobility, EOB to chair transfer, and UE assessment.  Cognition: Cognition Overall Cognitive Status: Within Functional Limits for tasks assessed Orientation Level: Oriented X4 Cognition Arousal/Alertness: Awake/alert Behavior During Therapy: WFL for tasks assessed/performed Overall Cognitive Status: Within Functional Limits for tasks  assessed  Physical Exam: Blood pressure (!) 179/84, pulse 68, temperature 98 F (36.7 C), temperature source Oral, resp. rate 20, height 5\' 11"  (1.803 m), weight 75.2 kg, SpO2 98 %. Physical Exam Vitals reviewed.  Constitutional:      General: He is not in acute distress.    Appearance: He is normal weight. He is not ill-appearing.  HENT:     Head: Normocephalic and atraumatic.     Right Ear: External ear normal.     Left Ear: External ear normal.     Nose: Nose normal.  Eyes:     General:        Right eye: No discharge.        Left eye: No discharge.     Extraocular Movements: Extraocular movements intact.  Cardiovascular:     Rate and Rhythm: Normal rate and regular rhythm.  Pulmonary:     Effort: Pulmonary effort is normal. No respiratory distress.     Breath sounds: No stridor.  Abdominal:     General: Abdomen is flat. Bowel sounds are normal. There is no distension.  Musculoskeletal:     Cervical back: Normal range of motion and neck supple.     Comments: No edema or tenderness in extremities  Skin:    General: Skin is warm and dry.  Neurological:     Mental Status: He is alert.     Comments: Alert No acute distress.   Makes eye contact with examiner.   Follows commands.   Name and age.   He does have some mild delay in processing. Dysarthria Motor: RUE/RLE: 4/5 proximal distal LUE: 4-/5 proximal distal LLE: 1+/5 proximal distal  Psychiatric:        Mood and Affect: Mood normal.     Comments: Somewhat delayed.    Results for orders placed or performed during the hospital encounter of 07/27/21 (from the past 48 hour(s))  Glucose, capillary     Status: Abnormal   Collection Time: 07/29/21  1:27 PM  Result Value Ref Range   Glucose-Capillary 101 (H) 70 - 99 mg/dL    Comment: Glucose reference range applies only to samples taken after fasting for at least 8 hours.  Glucose, capillary     Status: Abnormal   Collection Time: 07/29/21  4:10 PM  Result Value Ref  Range   Glucose-Capillary 118 (H) 70 - 99 mg/dL    Comment: Glucose reference range applies only to samples taken after fasting for at least 8 hours.  Glucose, capillary     Status: Abnormal   Collection Time: 07/29/21  8:18 PM  Result Value Ref Range   Glucose-Capillary 146 (H) 70 - 99 mg/dL    Comment: Glucose reference range applies only to samples taken after fasting for at  least 8 hours.  Glucose, capillary     Status: Abnormal   Collection Time: 07/30/21  7:22 AM  Result Value Ref Range   Glucose-Capillary 157 (H) 70 - 99 mg/dL    Comment: Glucose reference range applies only to samples taken after fasting for at least 8 hours.  Glucose, capillary     Status: Abnormal   Collection Time: 07/30/21 11:06 AM  Result Value Ref Range   Glucose-Capillary 186 (H) 70 - 99 mg/dL    Comment: Glucose reference range applies only to samples taken after fasting for at least 8 hours.  Glucose, capillary     Status: Abnormal   Collection Time: 07/30/21  5:05 PM  Result Value Ref Range   Glucose-Capillary 211 (H) 70 - 99 mg/dL    Comment: Glucose reference range applies only to samples taken after fasting for at least 8 hours.  Glucose, capillary     Status: None   Collection Time: 07/30/21  9:44 PM  Result Value Ref Range   Glucose-Capillary 97 70 - 99 mg/dL    Comment: Glucose reference range applies only to samples taken after fasting for at least 8 hours.  Glucose, capillary     Status: Abnormal   Collection Time: 07/31/21  7:22 AM  Result Value Ref Range   Glucose-Capillary 164 (H) 70 - 99 mg/dL    Comment: Glucose reference range applies only to samples taken after fasting for at least 8 hours.  Glucose, capillary     Status: Abnormal   Collection Time: 07/31/21 11:09 AM  Result Value Ref Range   Glucose-Capillary 216 (H) 70 - 99 mg/dL    Comment: Glucose reference range applies only to samples taken after fasting for at least 8 hours.   ECHO TEE  Result Date: 07/29/2021     TRANSESOPHOGEAL ECHO REPORT   Patient Name:   SENG LARCH Date of Exam: 07/29/2021 Medical Rec #:  [id number]         Height:       71.0 in Accession #:    [id number]        Weight:       165.8 lb Date of Birth:  December 26, 1932        BSA:          1.947 m Patient Age:    52 years          BP:           220/63 mmHg Patient Gender: M                 HR:           70 bpm. Exam Location:  [facility] Procedure: Transesophageal Echo, Cardiac Doppler and Color Doppler Indications:    Stroke  History:        Patient has prior history of Echocardiogram examinations, most                 recent 07/28/2021. CAD, COPD, Arrythmias:PVC; Risk                 Factors:Hypertension, Diabetes, Dyslipidemia and Current Smoker.  Sonographer:    Alvino Chapel RCS Referring Phys: [id number] [place]: After discussion of the risks and benefits of a TEE, an informed consent was obtained from the patient. TEE procedure time was 15 minutes. The transesophogeal probe was passed without difficulty through the esophogus of the patient. Sedation performed by different physician. The patient was monitored while under deep  sedation. Anesthestetic sedation was provided intravenously by Anesthesiology: 250mg  of Propofol, 50mg  of Lidocaine. Image quality was excellent. The patient's vital signs; including heart rate, blood pressure, and oxygen saturation; remained stable throughout the procedure. The patient developed no complications during the procedure. IMPRESSIONS  1. Left ventricular ejection fraction, by estimation, is 60 to 65%. The left ventricle has normal function.  2. Right ventricular systolic function is normal. The right ventricular size is normal.  3. No left atrial/left atrial appendage thrombus was detected. The LAA emptying velocity was 90 cm/s.  4. The mitral valve is grossly normal. Trivial mitral valve regurgitation. No evidence of mitral stenosis.  5. The aortic valve is tricuspid. There is moderate calcification of  the aortic valve. There is moderate thickening of the aortic valve. Aortic valve regurgitation is not visualized. Moderate aortic valve stenosis. Aortic valve area, by VTI measures 1.31 cm. Aortic valve mean gradient measures 23.3 mmHg. Aortic valve Vmax measures 3.10 m/s.  6. There is Moderate (Grade III) layered plaque involving the descending aorta and transverse aorta.  7. Agitated saline contrast bubble study was negative, with no evidence of any interatrial shunt. Conclusion(s)/Recommendation(s): No LA/LAA thrombus identified. Negative bubble study for interatrial shunt. No intracardiac source of embolism detected on this on this transesophageal echocardiogram. FINDINGS  Left Ventricle: Left ventricular ejection fraction, by estimation, is 60 to 65%. The left ventricle has normal function. The left ventricular internal cavity size was normal in size. Right Ventricle: The right ventricular size is normal. No increase in right ventricular wall thickness. Right ventricular systolic function is normal. Left Atrium: Left atrial size was normal in size. No left atrial/left atrial appendage thrombus was detected. The LAA emptying velocity was 90 cm/s. Right Atrium: Right atrial size was normal in size. Prominent Eustachian valve. Pericardium: Trivial pericardial effusion is present. Mitral Valve: The mitral valve is grossly normal. Trivial mitral valve regurgitation. No evidence of mitral valve stenosis. Tricuspid Valve: The tricuspid valve is grossly normal. Tricuspid valve regurgitation is mild . No evidence of tricuspid stenosis. Aortic Valve: The aortic valve is tricuspid. There is moderate calcification of the aortic valve. There is moderate thickening of the aortic valve. Aortic valve regurgitation is not visualized. Moderate aortic stenosis is present. Aortic valve mean gradient measures 23.3 mmHg. Aortic valve peak gradient measures 38.5 mmHg. Aortic valve area, by VTI measures 1.31 cm. Pulmonic Valve: The  pulmonic valve was grossly normal. Pulmonic valve regurgitation is not visualized. No evidence of pulmonic stenosis. Aorta: The aortic root and ascending aorta are structurally normal, with no evidence of dilitation. There is moderate (Grade III) layered plaque involving the descending aorta and transverse aorta. Venous: The left upper pulmonary vein, left lower pulmonary vein, right lower pulmonary vein and right upper pulmonary vein are normal. IAS/Shunts: No atrial level shunt detected by color flow Doppler. Agitated saline contrast was given intravenously to evaluate for intracardiac shunting. Agitated saline contrast bubble study was negative, with no evidence of any interatrial shunt.  LEFT VENTRICLE PLAX 2D LVOT diam:     2.20 cm LV SV:         86 LV SV Index:   44 LVOT Area:     3.80 cm  AORTIC VALVE AV Area (Vmax):    1.53 cm AV Area (Vmean):   1.52 cm AV Area (VTI):     1.31 cm AV Vmax:           310.36 cm/s AV Vmean:  231.530 cm/s AV VTI:            0.660 m AV Peak Grad:      38.5 mmHg AV Mean Grad:      23.3 mmHg LVOT Vmax:         124.89 cm/s LVOT Vmean:        92.445 cm/s LVOT VTI:          0.227 m LVOT/AV VTI ratio: 0.34  AORTA Ao Root diam: 3.48 cm Ao Asc diam:  3.36 cm TRICUSPID VALVE TR Peak grad:   5.8 mmHg TR Vmax:        120.00 cm/s  SHUNTS Systemic VTI:  0.23 m Systemic Diam: 2.20 cm Eleonore Chiquito MD Electronically signed by Eleonore Chiquito MD Signature Date/Time: 07/29/2021/4:27:23 PM    Final        Medical Problem List and Plan: 1.  Left-sided hemiparesis secondary to infarct right posterior lentiform nucleus, thalamus, and tail of the caudate with additional punctate infarct in the lateral left thalamus.    -patient may shower  -ELOS/Goals: 14-17 days/supervision/min a  Admit to CIR 2.  Antithrombotics: -DVT/anticoagulation:  Pharmaceutical: Heparin  -antiplatelet therapy: Aspirin 81 mg daily and Plavix 75 mg daily 3. Pain Management: Lyrica 50 mg 3 times daily,  hydrocodone as needed  Monitor with increased exertion 4. Mood: Klonopin 0.25 mg 3 times daily, Prozac 20 mg daily  -antipsychotic agents: N/A 5. Neuropsych: This patient is capable of making decisions on his own behalf. 6. Skin/Wound Care: Routine skin checks 7. Fluids/Electrolytes/Nutrition: Routine in and outs  CMP ordered for tomorrow 8.  Hypertension.  Norvasc 5 mg daily, Toprol-XL 25 mg daily.    Monitor with increased mobility 9.  Hyperlipidemia.  Zocor/Zetia 10.  COPD with history of tobacco use.  Continue nebulizers as directed as well as Singulair.  Check oxygen saturations every shift 11.  Diabetes mellitus with hyperglycemia.  Hemoglobin A1c 6.7.  Amaryl 0.5 mg twice daily  Monitor with increased activity 12.  Right side parotid mass.  Follow-up outpatient ENT 13.  CKD stage III.  Baseline creatinine 1.1-1.3.    CMP ordered 14.  GERD.  Protonix   Lavon Paganini Angiulli, PA-C 07/31/2021  I have personally performed a face to face diagnostic evaluation, including, but not limited to relevant history and physical exam findings, of this patient and developed relevant assessment and plan.  Additionally, I have reviewed and concur with the physician assistant's documentation above.  Delice Lesch, MD, ABPMR

## 2021-08-01 DIAGNOSIS — I6381 Other cerebral infarction due to occlusion or stenosis of small artery: Secondary | ICD-10-CM

## 2021-08-01 DIAGNOSIS — I10 Essential (primary) hypertension: Secondary | ICD-10-CM | POA: Diagnosis not present

## 2021-08-01 DIAGNOSIS — N1831 Chronic kidney disease, stage 3a: Secondary | ICD-10-CM

## 2021-08-01 DIAGNOSIS — E1165 Type 2 diabetes mellitus with hyperglycemia: Secondary | ICD-10-CM | POA: Diagnosis not present

## 2021-08-01 LAB — GLUCOSE, CAPILLARY
Glucose-Capillary: 166 mg/dL — ABNORMAL HIGH (ref 70–99)
Glucose-Capillary: 117 mg/dL — ABNORMAL HIGH (ref 70–99)
Glucose-Capillary: 146 mg/dL — ABNORMAL HIGH (ref 70–99)
Glucose-Capillary: 184 mg/dL — ABNORMAL HIGH (ref 70–99)

## 2021-08-01 NOTE — Plan of Care (Signed)
Problem: RH Balance Goal: LTG: Patient will maintain dynamic sitting balance (OT) Description: LTG:  Patient will maintain dynamic sitting balance with assistance during activities of daily living (OT) Flowsheets (Taken 08/01/2021 1257) LTG: Pt will maintain dynamic sitting balance during ADLs with: Supervision/Verbal cueing Goal: LTG Patient will maintain dynamic standing with ADLs (OT) Description: LTG:  Patient will maintain dynamic standing balance with assist during activities of daily living (OT)  Flowsheets (Taken 08/01/2021 1257) LTG: Pt will maintain dynamic standing balance during ADLs with: Minimal Assistance - Patient > 75%   Problem: Sit to Stand Goal: LTG:  Patient will perform sit to stand in prep for activites of daily living with assistance level (OT) Description: LTG:  Patient will perform sit to stand in prep for activites of daily living with assistance level (OT) Flowsheets (Taken 08/01/2021 1257) LTG: PT will perform sit to stand in prep for activites of daily living with assistance level: Contact Guard/Touching assist   Problem: RH Eating Goal: LTG Patient will perform eating w/assist, cues/equip (OT) Description: LTG: Patient will perform eating with assist, with/without cues using equipment (OT) Flowsheets (Taken 08/01/2021 1257) LTG: Pt will perform eating with assistance level of: Independent   Problem: RH Grooming Goal: LTG Patient will perform grooming w/assist,cues/equip (OT) Description: LTG: Patient will perform grooming with assist, with/without cues using equipment (OT) Flowsheets (Taken 08/01/2021 1257) LTG: Pt will perform grooming with assistance level of: Supervision/Verbal cueing   Problem: RH Bathing Goal: LTG Patient will bathe all body parts with assist levels (OT) Description: LTG: Patient will bathe all body parts with assist levels (OT) Flowsheets (Taken 08/01/2021 1257) LTG: Pt will perform bathing with assistance level/cueing: Minimal  Assistance - Patient > 75%   Problem: RH Dressing Goal: LTG Patient will perform upper body dressing (OT) Description: LTG Patient will perform upper body dressing with assist, with/without cues (OT). Flowsheets (Taken 08/01/2021 1257) LTG: Pt will perform upper body dressing with assistance level of: Supervision/Verbal cueing Goal: LTG Patient will perform lower body dressing w/assist (OT) Description: LTG: Patient will perform lower body dressing with assist, with/without cues in positioning using equipment (OT) Flowsheets (Taken 08/01/2021 1257) LTG: Pt will perform lower body dressing with assistance level of: Minimal Assistance - Patient > 75%   Problem: RH Toileting Goal: LTG Patient will perform toileting task (3/3 steps) with assistance level (OT) Description: LTG: Patient will perform toileting task (3/3 steps) with assistance level (OT)  Flowsheets (Taken 08/01/2021 1257) LTG: Pt will perform toileting task (3/3 steps) with assistance level: Minimal Assistance - Patient > 75%   Problem: RH Functional Use of Upper Extremity Goal: LTG Patient will use RT/LT upper extremity as a (OT) Description: LTG: Patient will use right/left upper extremity as a stabilizer/gross assist/diminished/nondominant/dominant level with assist, with/without cues during functional activity (OT) Flowsheets (Taken 08/01/2021 1257) LTG: Use of upper extremity in functional activities: LUE as nondominant level LTG: Pt will use upper extremity in functional activity with assistance level of: Supervision/Verbal cueing   Problem: RH Toilet Transfers Goal: LTG Patient will perform toilet transfers w/assist (OT) Description: LTG: Patient will perform toilet transfers with assist, with/without cues using equipment (OT) Flowsheets (Taken 08/01/2021 1257) LTG: Pt will perform toilet transfers with assistance level of: Minimal Assistance - Patient > 75%   Problem: RH Tub/Shower Transfers Goal: LTG Patient will  perform tub/shower transfers w/assist (OT) Description: LTG: Patient will perform tub/shower transfers with assist, with/without cues using equipment (OT) Flowsheets (Taken 08/01/2021 1257) LTG: Pt will perform tub/shower stall transfers  with assistance level of: Minimal Assistance - Patient > 75%   Problem: RH Memory Goal: LTG Patient will demonstrate ability for day to day recall/carry over during activities of daily living with assistance level (OT) Description: LTG:  Patient will demonstrate ability for day to day recall/carry over during activities of daily living with assistance level (OT). Flowsheets (Taken 08/01/2021 1257) LTG:  Patient will demonstrate ability for day to day recall/carry over during activities of daily living with assistance level (OT): Modified Independent

## 2021-08-01 NOTE — Progress Notes (Signed)
[place] PHYSICAL MEDICINE & REHABILITATION PROGRESS NOTE  Subjective/Complaints: Patient seen sitting up in bed this morning.  He states he slept well overnight.  He states he is ready begin therapies.  ROS: Denies CP, SOB, N/V/D  Objective: Vital Signs: Blood pressure (!) 185/80, pulse 64, temperature 98.4 F (36.9 C), temperature source Oral, resp. rate 14, height 5\' 11"  (1.803 m), weight 74.7 kg, SpO2 98 %. No results found. Recent Labs    07/31/21 1342  WBC 8.2  HGB 14.2  HCT 44.5  PLT 200   Recent Labs    07/31/21 1342  CREATININE 1.28*    Intake/Output Summary (Last 24 hours) at 08/01/2021 0815 Last data filed at 08/01/2021 0740 Gross per 24 hour  Intake 240 ml  Output 700 ml  Net -460 ml        Physical Exam: BP (!) 185/80   Pulse 64   Temp 98.4 F (36.9 C) (Oral)   Resp 14   Ht 5\' 11"  (1.803 m)   Wt 74.7 kg   SpO2 98%   BMI 22.97 kg/m  Constitutional: No distress . Vital signs reviewed. HENT: Normocephalic.  Atraumatic. Eyes: EOMI. No discharge. Cardiovascular: No JVD.  RRR. Respiratory: Normal effort.  No stridor.  Bilateral clear to auscultation. GI: Non-distended.  BS +. Skin: Warm and dry.  Intact. Psych: Normal mood.  Somewhat delayed. Musc: No edema in extremities.  No tenderness in extremities. Neuro: Alert Makes eye contact with examiner.   Follows commands.   Name and age.   He does have some mild delay in processing. Dysarthria, stable Motor: RUE/RLE: 4/5 proximal distal LUE: 4-/5 proximal distal LLE: 1+/5 proximal distal, unchanged  Assessment/Plan: 1. Functional deficits which require 3+ hours per day of interdisciplinary therapy in a comprehensive inpatient rehab setting. Physiatrist is providing close team supervision and 24 hour management of active medical problems listed below. Physiatrist and rehab team continue to assess barriers to discharge/monitor patient progress toward functional and medical goals   Care  Tool:  Bathing              Bathing assist       Upper Body Dressing/Undressing Upper body dressing        Upper body assist      Lower Body Dressing/Undressing Lower body dressing            Lower body assist       Toileting Toileting    Toileting assist       Transfers Chair/bed transfer  Transfers assist           Locomotion Ambulation   Ambulation assist              Walk 10 feet activity   Assist           Walk 50 feet activity   Assist           Walk 150 feet activity   Assist           Walk 10 feet on uneven surface  activity   Assist           Wheelchair     Assist               Wheelchair 50 feet with 2 turns activity    Assist            Wheelchair 150 feet activity     Assist           Medical Problem List  and Plan: 1.  Left-sided hemiparesis secondary to infarct right posterior lentiform nucleus, thalamus, and tail of the caudate with additional punctate infarct in the lateral left thalamus.   Begin CIR evaluations  2.  Antithrombotics: -DVT/anticoagulation:  Pharmaceutical: Heparin             -antiplatelet therapy: Aspirin 81 mg daily and Plavix 75 mg daily until 10/30, then Plavix alone. 3. Pain Management: Lyrica 50 mg 3 times daily, hydrocodone as needed             Monitor with increased exertion 4. Mood: Klonopin 0.25 mg 3 times daily, Prozac 20 mg daily             -antipsychotic agents: N/A 5. Neuropsych: This patient is capable of making decisions on his own behalf. 6. Skin/Wound Care: Routine skin checks 7. Fluids/Electrolytes/Nutrition: Routine in and outs             CMP ordered for tomorrow 8.  Hypertension.  Norvasc 5 mg daily, Toprol-XL 25 mg daily.              Elevated, monitor with increased mobility 9.  Hyperlipidemia.  Zocor/Zetia 10.  COPD with history of tobacco use.  Continue nebulizers as directed as well as Singulair.  Check oxygen  saturations every shift 11.  Diabetes mellitus with hyperglycemia.  Hemoglobin A1c 6.7.  Amaryl 0.5 mg twice daily             Monitor with increased mobility 12.  Right side parotid mass.  Follow-up outpatient ENT 13.  CKD stage III.  Baseline creatinine 1.1-1.3.               Creatinine 1.28 on 10/14, labs ordered for Monday 14.  GERD.  Protonix   LOS: 1 days A FACE TO FACE EVALUATION WAS PERFORMED  Makani Seckman Lorie Phenix 08/01/2021, 8:15 AM

## 2021-08-01 NOTE — Evaluation (Signed)
Speech Language Pathology Assessment and Plan  Patient Details  Name: Barry [name] MRN: [id number] Date of Birth: 1932-11-15  SLP Diagnosis: Cognitive Impairments  Rehab Potential: Excellent ELOS: 3-4 weeks    Today's Date: 08/01/2021 SLP Individual Time: 2202-5427 SLP Individual Time Calculation (min): 87 min   Hospital Problem: Principal Problem:   Right thalamic infarction [facility])  Past Medical History:  Past Medical History:  Diagnosis Date   AAA (abdominal aortic aneurysm) 3-09   4.7 cm   Allergy    rhinitis   Anxiety    Cancer ([place])    skin   Cataract    Depression    Diabetes mellitus    Diverticulosis    GERD (gastroesophageal reflux disease)    Hepatitis    Hx of colonic polyps 08/26/2015   Hyperlipidemia    Hypertension    Nephrolithiasis    Personal history of colonic polyps    adenomas, serrated also   PVD (peripheral vascular disease) ([place])    Tobacco abuse    Past Surgical History:  Past Surgical History:  Procedure Laterality Date   COLONOSCOPY  2006, 2008, 2010, 05/27/2011   numerous adenomas - 13 in 2006, 3, 2008, 2 2012 (up to 1 cm), 4 diminutive adenomas, serrated adenomas 2012. Diverticulosis and hemorrhoids also.   EYE SURGERY     EYE SURGERY Left    Kidney stone operation     lower limb amputation, other toe 5th     percutaneous stent graft repair of infrarenal AAA  11/10   5.6 cm (T. early)   surgical excision basal cell carcinoma     TEE WITHOUT CARDIOVERSION N/A 07/29/2021   Procedure: TRANSESOPHAGEAL ECHOCARDIOGRAM (TEE);  Surgeon: Geralynn Rile, MD;  Location: [facility];  Service: Cardiovascular;  Laterality: N/A;   tympanic eardrum repair     VASECTOMY      Assessment / Plan / Recommendation Clinical Impression Patient is an 87 year old right-handed male with history of CAD maintained on low-dose aspirin, CKD stage III, COPD/tobacco use, type 2 diabetes mellitus, hypertension, and GERD.   He presented on 07/27/2021 to  [facility] with acute onset of left-sided hemiparesis.  CT/MRI showed acute right posterior lentiform nucleus, thalamus, anterior caudate infarct with additional punctate infarct in the lateral left thalamus and possibly the left lentiform nucleus.  CT angiogram head and neck with mild atherosclerotic plaque of the bilateral carotid bulbs resulting in 30 to 40% stenosis on the right.  1 cm enhancing lesion in the right parotid gland indeterminate recommend ENT follow-up as outpatient.  MRA head showed no significant proximal stenosis aneurysm or branch vessel occlusion.  Echocardiogram with ejection fraction of 60 to 06%, grade 1 diastolic dysfunction. Tolerating regular consistency diet.  Therapy evaluations completed with recommendations for   a comprehensive rehab program.  Patient admitted 07/31/21.  Patient demonstrates moderate cognitive impairments characterized by decreased orientation to time, decreased emergent awareness, decreased problem solving and decreased recall of functional information. A mild left inattention was also noted. Throughout evaluation, patient's attention and overall pragmatics appeared [facility] for tasks assessed, however, patient would often repeat information regarding previous level of function with minimal awareness. Patient's son present and often reported intermittent confusion throughout the day. Patient's auditory comprehension and verbal expression appeared [facility] for tasks assessed, however, patient is mildly hard of hearing at baseline which can impact his function at times. Patient would benefit from skilled SLP intervention to maximize his cognitive functioning and overall functional independence prior to  discharge.     Skilled Therapeutic Interventions          Administered a cognitive-linguistic assessment, please see above for details.   SLP Assessment  Patient will need skilled [facility] during CIR admission    Recommendations   Oral Care Recommendations: Oral care BID Recommendations for Other Services: Neuropsych consult Patient destination: Home Follow up Recommendations: 24 hour supervision/assistance;Outpatient SLP;Home Health SLP Equipment Recommended: None recommended by SLP    SLP Frequency 3 to 5 out of 7 days   SLP Duration  SLP Intensity  SLP Treatment/Interventions 3-4 weeks  Minumum of 1-2 x/day, 30 to 90 minutes  Cognitive remediation/compensation;Internal/external aids;Therapeutic Activities;Cueing hierarchy;Environmental controls;Functional tasks;Patient/family education    Pain Pain Assessment Pain Scale: 0-10 Pain Score: 0-No pain  Prior Functioning Type of Home: Mobile home  Lives With: Alone Available Help at Discharge: Family;Available 24 hours/day (Son will hire caregires if needed. Works 7-5) Vocation: Retired  Programmer, systems Overall Cognitive Status: Impaired/Different from baseline Arousal/Alertness: Awake/alert Orientation Level: Oriented to person;Oriented to place;Oriented to situation;Disoriented to time Year: 2020 Month: October Day of Week: Correct (Correct day of week but incorrect date) Attention: Focused;Sustained Focused Attention: Appears intact Sustained Attention: Appears intact Memory: Impaired Memory Impairment: Decreased recall of new information;Decreased short term memory Decreased Short Term Memory: Verbal basic;Functional basic Awareness: Impaired Awareness Impairment: Emergent impairment Problem Solving: Impaired Problem Solving Impairment: Verbal basic;Functional basic Safety/Judgment: Impaired  Comprehension Auditory Comprehension Overall Auditory Comprehension: Appears within functional limits for tasks assessed Expression Expression Primary Mode of Expression: Verbal Verbal Expression Overall Verbal Expression: Appears within functional limits for tasks assessed Oral Motor Oral Motor/Sensory Function Overall Oral  Motor/Sensory Function: Within functional limits Motor Speech Overall Motor Speech: Appears within functional limits for tasks assessed  Care Tool Care Tool Cognition Ability to hear (with hearing aid or hearing appliances if normally used Ability to hear (with hearing aid or hearing appliances if normally used): 1. Minimal difficulty - difficulty in some environments (e.g. when person speaks softly or setting is noisy)   Expression of Ideas and Wants Expression of Ideas and Wants: 3. Some difficulty - exhibits some difficulty with expressing needs and ideas (e.g, some words or finishing thoughts) or speech is not clear   Understanding Verbal and Non-Verbal Content Understanding Verbal and Non-Verbal Content: 3. Usually understands - understands most conversations, but misses some part/intent of message. Requires cues at times to understand  Memory/Recall Ability Memory/Recall Ability : Current season;That he or she is in a hospital/hospital unit    Short Term Goals: Week 1: SLP Short Term Goal 1 (Week 1): Patient will demonstrate functional problem solving for basic and familiar tasks with Min A verbal cues. SLP Short Term Goal 2 (Week 1): Patient will utilize external memory aids to recall functional information with Mod A multimodal cues. SLP Short Term Goal 3 (Week 1): Patient will self-monitor and correct errors during functional tasks with Mod A verbal cues. SLP Short Term Goal 4 (Week 1): Patient will attend to left visual field/body during functional tasks with Min verbal cues.  Refer to Care Plan for Long Term Goals  Recommendations for other services: Neuropsych  Discharge Criteria: Patient will be discharged from SLP if patient refuses treatment 3 consecutive times without medical reason, if treatment goals not met, if there is a change in medical status, if patient makes no progress towards goals or if patient is discharged from hospital.  The above assessment, treatment plan,  treatment alternatives and goals  were discussed and mutually agreed upon: by patient and family  Dominiq Fontaine 08/01/2021, 2:32 PM

## 2021-08-01 NOTE — Evaluation (Signed)
Physical Therapy Assessment and Plan  Patient Details  Name: Barry [name] MRN: [id number] Date of Birth: 05/12/1933  PT Diagnosis: Abnormal posture, Abnormality of gait, Cognitive deficits, Difficulty walking, Hemiparesis non-dominant, Impaired cognition, Impaired sensation, Muscle weakness, and Pain in L big toe Rehab Potential: Fair ELOS: 3-4 weeks   Today's Date: 08/01/2021 PT Individual Time: 1100-1200 PT Individual Time Calculation (min): 60 min    Hospital Problem: Principal Problem:   Right thalamic infarction [facility])   Past Medical History:  Past Medical History:  Diagnosis Date   AAA (abdominal aortic aneurysm) 3-09   4.7 cm   Allergy    rhinitis   Anxiety    Cancer ([place])    skin   Cataract    Depression    Diabetes mellitus    Diverticulosis    GERD (gastroesophageal reflux disease)    Hepatitis    Hx of colonic polyps 08/26/2015   Hyperlipidemia    Hypertension    Nephrolithiasis    Personal history of colonic polyps    adenomas, serrated also   PVD (peripheral vascular disease) ([place])    Tobacco abuse    Past Surgical History:  Past Surgical History:  Procedure Laterality Date   COLONOSCOPY  2006, 2008, 2010, 05/27/2011   numerous adenomas - 13 in 2006, 3, 2008, 2 2012 (up to 1 cm), 4 diminutive adenomas, serrated adenomas 2012. Diverticulosis and hemorrhoids also.   EYE SURGERY     EYE SURGERY Left    Kidney stone operation     lower limb amputation, other toe 5th     percutaneous stent graft repair of infrarenal AAA  11/10   5.6 cm (T. early)   surgical excision basal cell carcinoma     TEE WITHOUT CARDIOVERSION N/A 07/29/2021   Procedure: TRANSESOPHAGEAL ECHOCARDIOGRAM (TEE);  Surgeon: Geralynn Rile, MD;  Location: [facility];  Service: Cardiovascular;  Laterality: N/A;   tympanic eardrum repair     VASECTOMY      Assessment & Plan Clinical Impression: Patient is  a 87 year old right-handed male with history of CAD maintained on low-dose  aspirin, CKD stage III, COPD/tobacco use, type 2 diabetes mellitus, hypertension, GERD.  History taken from patient and chart review.  Patient lives alone.  Mobile home with 3 steps to entry.  Reportedly independent prior to admission using an occasional cane.  He does have a son in the [place] area who works and can provide assistance in the evening.  He presented on 07/27/20[facility] to [facility] with acute onset of left-sided hemiparesis.  CT/MRI showed acute right posterior lentiform nucleus, thalamus, anterior caudate infarct with additional punctate infarct in the lateral left thalamus and possibly the left lentiform nucleus.  CT angiogram head and neck with mild atherosclerotic plaque of the bilateral carotid bulbs resulting in 30 to 40% stenosis on the right.  1 cm enhancing lesion in the right parotid gland indeterminate recommend ENT follow-up as outpatient.  MRA head showed no significant proximal stenosis aneurysm or branch vessel occlusion.  Echocardiogram with ejection fraction of 60 to 77%, grade 1 diastolic dysfunction.  TEE completed showing no left atrial/left atrial appendage or thrombus detected.  No PFO by bubble.  He currently mains on aspirin and Plavix for CVA prophylaxis.  Subcutaneous heparin for DVT prophylaxis.  Tolerating regular consistency diet.  Therapy evaluations completed due to patient's left hemiparesis was admitted for a comprehensive rehab program. Patient transferred to [facility] on 07/31/2021 .   Patient currently requires max  to [place] with mobility secondary to muscle weakness, decreased cardiorespiratoy endurance, impaired timing and sequencing, unbalanced muscle activation, motor apraxia, and decreased motor planning, decreased midline orientation, decreased attention to left, and decreased motor planning, decreased attention, decreased awareness, decreased problem solving, decreased safety awareness, decreased memory, and delayed processing, and decreased sitting  balance, decreased standing balance, decreased postural control, hemiplegia, and decreased balance strategies.  Prior to hospitalization, patient was independent  with mobility and lived with Alone in a Mobile home home.  Home access is 3Stairs to enter.  Patient will benefit from skilled PT intervention to maximize safe functional mobility, minimize fall risk, and decrease caregiver burden for planned discharge home with 24 hour assist.  Anticipate patient will benefit from follow up [facility] at discharge.  PT - End of Session Activity Tolerance: Tolerates < 10 min activity, no significant change in vital signs Endurance Deficit: Yes Endurance Deficit Description: Global deconditioning and weakness requiring rest breaks b/w functional mobility tasks. PT Assessment Rehab Potential (ACUTE/IP ONLY): Fair PT Barriers to Discharge: Decreased caregiver support;Home environment access/layout;Insurance for SNF coverage PT Patient demonstrates impairments in the following area(s): Balance;Motor;Endurance;Safety;Pain;Skin Integrity;Sensory PT Transfers Functional Problem(s): Bed Mobility;Bed to Chair;Car PT Locomotion Functional Problem(s): Ambulation;Stairs PT Plan PT Intensity: Minimum of 1-2 x/day ,45 to 90 minutes PT Frequency: 5 out of 7 days PT Duration Estimated Length of Stay: 3-4 weeks PT Treatment/Interventions: Ambulation/gait training;Cognitive remediation/compensation;Discharge planning;DME/adaptive equipment instruction;Functional mobility training;Pain management;Psychosocial support;Splinting/orthotics;Therapeutic Activities;UE/LE Strength taining/ROM;Visual/perceptual remediation/compensation;Wheelchair propulsion/positioning;UE/LE Coordination activities;Therapeutic Exercise;Stair training;Skin care/wound management;Patient/family education;Neuromuscular re-education;Functional electrical stimulation;Disease management/prevention;Community reintegration;Balance/vestibular training PT Transfers  Anticipated Outcome(s): minA with LRAD PT Locomotion Anticipated Outcome(s): minA with LRAD PT Recommendation Follow Up Recommendations: Home health PT;24 hour supervision/assistance Patient destination: Home Equipment Recommended: To be determined Equipment Details: Pt owns a SPC, no other DME   PT Evaluation Precautions/Restrictions Precautions Precautions: Fall Precaution Comments: L hemi, L inattention Restrictions Weight Bearing Restrictions: No General Chart Reviewed: Yes Family/Caregiver Present: Yes (Son)  Pain Pain Assessment Pain Scale: 0-10 Pain Score: 0-No pain Pain Interference Pain Interference Pain Effect on Sleep: 2. Occasionally (Restless legs) Pain Interference with Therapy Activities: 2. Occasionally (L toe) Pain Interference with Day-to-Day Activities: 1. Rarely or not at all Home Living/Prior [place] Available Help at Discharge: Family;Available 24 hours/day (Son will hire caregires if needed. Works [employer]) Type of Home: Mobile home Home Access: Stairs to enter Technical brewer of Steps: 3 Entrance Stairs-Rails: Right;Left;Can reach both Home Layout: One level Bathroom Shower/Tub: Tub/shower unit Additional Comments: Son lives in 2 story home with all bedrooms and full bathrooms upstairs. Has 2 rooms downstairs that can be converted to bedrooms. 1/2 bath downstairs. 3 STE with no rails.  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 Enjoys doing yardwork, mowing. Vision/Perception  Vision - History Ability to See in Adequate Light: 0 Adequate Perception Perception: Impaired Inattention/Neglect: Does not attend to left side of body Praxis Praxis:  Impaired Praxis Impairment Details: Motor planning  Cognition Overall Cognitive Status: Impaired/Different from baseline Arousal/Alertness: Awake/alert Orientation Level: Oriented to person;Oriented to place Year: 2020 Month: October Day of Week: Correct (Correct day of week but incorrect date) Attention: Focused;Sustained Focused Attention: Appears intact Sustained Attention: Appears intact Memory: Impaired Memory Impairment: Decreased recall of new information;Decreased short term memory Decreased Short Term Memory:  Verbal basic;Functional basic Awareness: Impaired Awareness Impairment: Anticipatory impairment Problem Solving: Impaired Problem Solving Impairment: Verbal basic;Functional basic Safety/Judgment: Impaired Sensation Sensation Light Touch: Appears Intact Hot/Cold: Appears Intact Proprioception: Impaired by gross assessment Stereognosis: Appears Intact Additional Comments: Able to discern light touch but subjectively reports assymetries b/w L and R side in both arms and leg Coordination Gross Motor Movements are Fluid and Coordinated: No Fine Motor Movements are Fluid and Coordinated: No Coordination and Movement Description: L inattention and L hemi impacting movement patterns, Heel Shin Test: UTA due to LLE weakness Motor  Motor Motor: Motor apraxia;Hemiplegia;Abnormal postural alignment and control Motor - Skilled Clinical Observations: L hemi, L lean in sitting, motor planning deficits during simple functional mobility tasks   Trunk/Postural Assessment  Cervical Assessment Cervical Assessment: Exceptions to [facility] (forward head) Thoracic Assessment Thoracic Assessment: Exceptions to [facility] (rounded shoulders and kyphosis) Lumbar Assessment Lumbar Assessment: Exceptions to [facility] (nonflexibile posterior pelvic tilt in sitting) Postural Control Postural Control: Deficits on evaluation Trunk Control: L lean in sitting - able to correct until outside BOS - difficulty  maintaining despite visual and verbal cues Righting Reactions: delayed and inadequate Protective Responses: delayed and inadequate  Balance Balance Balance Assessed: Yes Static Sitting Balance Static Sitting - Balance Support: Feet supported;No upper extremity supported Static Sitting - Level of Assistance: 4: Min assist Dynamic Sitting Balance Dynamic Sitting - Balance Support: Feet supported;During functional activity Dynamic Sitting - Level of Assistance: 2: Max assist Static Standing Balance Static Standing - Balance Support: Right upper extremity supported Static Standing - Level of Assistance: 1: +2 Total assist Extremity Assessment      RLE Assessment RLE Assessment: Exceptions to [facility] RLE Strength RLE Overall Strength: Deficits Right Hip Flexion: 3+/5 Right Knee Flexion: 4-/5 Right Knee Extension: 4-/5 Right Ankle Dorsiflexion: 4-/5 Right Ankle Plantar Flexion: 4-/5 LLE Assessment LLE Assessment: Exceptions to WFL LLE Strength LLE Overall Strength: Deficits Left Hip Flexion: 2/5 Left Knee Flexion: 2-/5 Left Knee Extension: 2-/5 Left Ankle Dorsiflexion: 2-/5 Left Ankle Plantar Flexion: 2-/5  Care Tool Care Tool Bed Mobility Roll left and right activity   Roll left and right assist level: Maximal Assistance - Patient 25 - 49%    Sit to lying activity   Sit to lying assist level: Total Assistance - Patient < 25%    Lying to sitting on side of bed activity   Lying to sitting on side of bed assist level: the ability to move from lying on the back to sitting on the side of the bed with no back support.: Maximal Assistance - Patient 25 - 49%     Care Tool Transfers Sit to stand transfer   Sit to stand assist level: 2 Helpers    Chair/bed transfer   Chair/bed transfer assist level: 2 Public relations account executive transfer activity did not occur: Safety/medical concerns        Care Tool Locomotion Ambulation Ambulation activity did  not occur: Safety/medical concerns        Walk 10 feet activity Walk 10 feet activity did not occur: Safety/medical concerns       Walk 50 feet with 2 turns activity Walk 50 feet with 2 turns activity did not occur: Safety/medical concerns      Walk 150 feet activity Walk 150 feet activity did not occur: Safety/medical concerns      Walk 10 feet on uneven surfaces activity Walk 10 feet on uneven surfaces  activity did not occur: Safety/medical concerns      Stairs Stair activity did not occur: Safety/medical concerns        Walk up/down 1 step activity Walk up/down 1 step or curb (drop down) activity did not occur: Safety/medical concerns     Walk up/down 4 steps activity did not occuR: Safety/medical concerns  Walk up/down 4 steps activity      Walk up/down 12 steps activity Walk up/down 12 steps activity did not occur: Safety/medical concerns      Pick up small objects from floor Pick up small object from the floor (from standing position) activity did not occur: Safety/medical concerns      Wheelchair Is the patient using a wheelchair?: Yes Type of Wheelchair: Manual   Wheelchair assist level: Dependent - Patient 0% Max wheelchair distance: 150'  Wheel 50 feet with 2 turns activity   Assist Level: Dependent - Patient 0%  Wheel 150 feet activity   Assist Level: Dependent - Patient 0%    Refer to Care Plan for Long Term Goals  SHORT TERM GOAL WEEK 1 PT Short Term Goal 1 (Week 1): Pt will complete bed mobility with maxA PT Short Term Goal 2 (Week 1): Pt will complete bed<>chair transfers with maxA and LRAD PT Short Term Goal 3 (Week 1): Pt will complete sit<>stand wtih maxA and LRAD PT Short Term Goal 4 (Week 1): Pt will demonstrate improved awareness of L side during functional mobility tasks  Recommendations for other services: None   Skilled Therapeutic Intervention Mobility Bed Mobility Bed Mobility: Sit to Supine;Supine to Sit Supine to Sit: Maximal  Assistance - Patient - Patient 25-49% Sit to Supine: Total Assistance - Patient < 25% Transfers Transfers: Sit to Stand;Stand to Sit;Squat Pivot Transfers Sit to Stand: 2 Helpers (totalA +2) Stand to Sit: 2 Helpers (totalA +2) Squat Pivot Transfers: 2 Helpers (totalA +2) Transfer (Assistive device): None Locomotion  Gait Ambulation: No Gait Gait: No Stairs / Additional Locomotion Stairs: No Wheelchair Mobility Wheelchair Mobility: No  Skilled Intervention: Pt greeted seated in w/c at start of session - awake and agreeable to PT evaluation. Son at the bedside to assist with PLOF and social factors as needed. Pt denies pain at rest but then later reports L big toe pain during palpation/ROM. Noted swelling and some redness around MTP joint - RN notified after session as pt reports this may have occurred after his fall from his CVA. Pt was pleasant and cooperative during session. Son reports the plan is for him to DC to the sons house with plans to hire caregiver assist - home with 2 lvl with 3 STE and 1/2 bath on main floor. Noted pt to have some L inattention during history taking as L arm was hanging off of w/c arm rests without pt realizing this and needed cues for correction. Transported to main rehab hallway for time - completed sit<>stand using R hand rail and 3-muskateers technique while blocking his paretic L knee - unable to achieve full upright despite +2 assist and would rather stand in a crouched position, lacking full hip/trunk extension. He also c/o L big toe pain while standing. Wheeled to ortho rehab gym for time - assisted to mat table via squat<>pivot transfer, requiring +2 totalA (pt 25%). At edge of mat in unsupported sitting, he requires minA for static sitting due to L lean - delayed righting reactions noted. Provided mirrror for visual feedback and provided verbal cueing for correction but continued to struggle with this.  Worked on sitting balance by completing perturbations in  multiple planes with difficulty most on the L and forward. Pt assisted back to his w/c via squat<>pivot transfer via [place] over-the-back technique to promote forward weight shift and this went well but still required totalA (pt 25%). Returned to room and pt requesting to return to bed - assisted to bed in similar manner and required totalA for sit>supine for BLE and trunk management. Semi-reclined in bed, bed lowered, bed alarm on, all needs in reach.   Instructed pt in results of PT evaluation as detailed above, PT POC, rehab potential, rehab goals, and discharge recommendations. Additionally discussed [facility]'s policies regarding fall safety and use of chair alarm and/or quick release belt. Pt verbalized understanding and in agreement. Will update pt's family members as they become available.   Discharge Criteria: Patient will be discharged from PT if patient refuses treatment 3 consecutive times without medical reason, if treatment goals not met, if there is a change in medical status, if patient makes no progress towards goals or if patient is discharged from hospital.  The above assessment, treatment plan, treatment alternatives and goals were discussed and mutually agreed upon: by patient and by son.  Kenslie Abbruzzese P Susana Duell PT, DPT 08/01/2021, 12:28 PM

## 2021-08-01 NOTE — Evaluation (Signed)
Occupational Therapy Assessment and Plan  Patient Details  Name: CYRIS MAALOUF MRN: [id number] Date of Birth: 20-Feb-1933  OT Diagnosis: abnormal posture, acute pain, cognitive deficits, hemiplegia affecting non-dominant side, muscle weakness (generalized), and decreased postural control in sitting/standing, decreased activity tolerance Rehab Potential: Rehab Potential (ACUTE ONLY): Fair ELOS: 3.5 to 4 weeks   Today's Date: 08/01/2021 OT Individual Time: 9741-6384 OT Individual Time Calculation (min): 58 min     Hospital Problem: Principal Problem:   Right thalamic infarction [facility])   Past Medical History:  Past Medical History:  Diagnosis Date   AAA (abdominal aortic aneurysm) 3-09   4.7 cm   Allergy    rhinitis   Anxiety    Cancer ([place])    skin   Cataract    Depression    Diabetes mellitus    Diverticulosis    GERD (gastroesophageal reflux disease)    Hepatitis    Hx of colonic polyps 08/26/2015   Hyperlipidemia    Hypertension    Nephrolithiasis    Personal history of colonic polyps    adenomas, serrated also   PVD (peripheral vascular disease) ([place])    Tobacco abuse    Past Surgical History:  Past Surgical History:  Procedure Laterality Date   COLONOSCOPY  2006, 2008, 2010, 05/27/2011   numerous adenomas - 13 in 2006, 3, 2008, 2 2012 (up to 1 cm), 4 diminutive adenomas, serrated adenomas 2012. Diverticulosis and hemorrhoids also.   EYE SURGERY     EYE SURGERY Left    Kidney stone operation     lower limb amputation, other toe 5th     percutaneous stent graft repair of infrarenal AAA  11/10   5.6 cm (T. early)   surgical excision basal cell carcinoma     TEE WITHOUT CARDIOVERSION N/A 07/29/2021   Procedure: TRANSESOPHAGEAL ECHOCARDIOGRAM (TEE);  Surgeon: Geralynn Rile, MD;  Location: [facility];  Service: Cardiovascular;  Laterality: N/A;   tympanic eardrum repair     VASECTOMY      Assessment & Plan Clinical Impression: Patient is a 87 y.o. year  old male with with history of CAD maintained on low-dose aspirin, CKD stage III, COPD/tobacco use, type 2 diabetes mellitus, hypertension, GERD.  History taken from patient and chart review.  Patient lives alone.  Mobile home with 3 steps to entry.  Reportedly independent prior to admission using an occasional cane.  He does have a son in the [place] area who works and can provide assistance in the evening.  He presented on 07/27/2021 to [facility] with acute onset of left-sided hemiparesis.  CT/MRI showed acute right posterior lentiform nucleus, thalamus, anterior caudate infarct with additional punctate infarct in the lateral left thalamus and possibly the left lentiform nucleus.  CT angiogram head and neck with mild atherosclerotic plaque of the bilateral carotid bulbs resulting in 30 to 40% stenosis on the right.  1 cm enhancing lesion in the right parotid gland indeterminate recommend ENT follow-up as outpatient.  MRA head showed no significant proximal stenosis aneurysm or branch vessel occlusion.  Echocardiogram with ejection fraction of 60 to 53%, grade 1 diastolic dysfunction.  TEE completed showing no left atrial/left atrial appendage or thrombus detected.  No PFO by bubble.  He currently mains on aspirin and Plavix for CVA prophylaxis.  Subcutaneous heparin for DVT prophylaxis.  Tolerating regular consistency diet.  Therapy evaluations completed due to patient's left hemiparesis was admitted for a comprehensive rehab program.  Please see preadmission assessment from earlier  today as well.  Patient transferred to CIR on 07/31/2021 .    Patient currently requires max with basic self-care skills secondary to muscle weakness, decreased cardiorespiratoy endurance, impaired timing and sequencing, abnormal tone, unbalanced muscle activation, motor apraxia, decreased coordination, and decreased motor planning, decreased midline orientation, decreased attention to left, and decreased motor planning,  decreased attention, decreased awareness, decreased problem solving, decreased safety awareness, and decreased memory, and decreased sitting balance, decreased standing balance, decreased postural control, hemiplegia, and decreased balance strategies.  Prior to hospitalization, patient could complete BADL/IADL/func mobility with modified independent .  Patient will benefit from skilled intervention to decrease level of assist with basic self-care skills and increase independence with basic self-care skills prior to discharge home with care partner.  Anticipate patient will require 24 hour supervision and follow up home health.  OT - End of Session Activity Tolerance: Tolerates 10 - 20 min activity with multiple rests Endurance Deficit: Yes Endurance Deficit Description: Global deconditioning and weakness requiring rest breaks b/w functional mobility tasks. OT Assessment Rehab Potential (ACUTE ONLY): Fair OT Barriers to Discharge: Inaccessible home environment OT Patient demonstrates impairments in the following area(s): Balance;Motor;Cognition;Endurance;Safety;Perception;Skin Integrity OT Basic ADL's Functional Problem(s): Eating;Grooming;Bathing;Dressing;Toileting OT Transfers Functional Problem(s): Toilet;Tub/Shower OT Additional Impairment(s): Fuctional Use of Upper Extremity OT Plan OT Intensity: Minimum of 1-2 x/day, 45 to 90 minutes OT Frequency: 5 out of 7 days OT Duration/Estimated Length of Stay: 3.5 to 4 weeks OT Treatment/Interventions: Balance/vestibular training;Community reintegration;Disease mangement/prevention;Functional electrical stimulation;Neuromuscular re-education;Patient/family education;Self Care/advanced ADL retraining;Splinting/orthotics;Therapeutic Exercise;UE/LE Coordination activities;Wheelchair propulsion/positioning;UE/LE Strength taining/ROM;Therapeutic Activities;Psychosocial support;Pain management;Functional mobility training;DME/adaptive equipment  instruction;Discharge planning;Cognitive remediation/compensation OT Self Feeding Anticipated Outcome(s): mod I OT Basic Self-Care Anticipated Outcome(s): min A OT Toileting Anticipated Outcome(s): min A OT Bathroom Transfers Anticipated Outcome(s): min A OT Recommendation Recommendations for Other Services: Therapeutic Recreation consult Therapeutic Recreation Interventions: Pet therapy;Kitchen group;Stress management;Outing/community reintergration Patient destination: Home Follow Up Recommendations: Home health OT Equipment Recommended: To be determined   OT Evaluation Precautions/Restrictions  Precautions Precautions: Fall Precaution Comments: L hemi, L inattention, HOH Restrictions Weight Bearing Restrictions: No General Chart Reviewed: Yes Family/Caregiver Present: No  Pain Pain Assessment Pain Scale: 0-10 Pain Score: 0-No pain Home Living/Prior Functioning Home Living Family/patient expects to be discharged to:: Private residence Available Help at Discharge: Family, Available 24 hours/day (Son will hire caregires if needed. Works 7-5) Type of Home: Mobile home Home Access: Stairs to enter Technical brewer of Steps: 3 Entrance Stairs-Rails: Right, Left, Can reach both Home Layout: One level Bathroom Shower/Tub: Chiropodist: Standard Bathroom Accessibility: Yes Additional Comments: Son lives in 2 story home with all bedrooms and full bathrooms upstairs. Has 2 rooms downstairs that can be converted to bedrooms. 1/2 bath downstairs. 3 STE with no rails.  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 Enjoys doing yardwork, mowing. Vision Baseline Vision/History: 1 Wears glasses (readers) Ability to See in Adequate Light: 0 Adequate Patient Visual Report: No change from baseline Vision Assessment?: Yes Eye Alignment: Within Functional Limits Ocular Range of Motion: Restricted on the right;Restricted looking up Alignment/Gaze Preference: Within  Defined Limits Tracking/Visual Pursuits: Decreased smoothness of eye movement to RIGHT superior field;Decreased smoothness of eye movement to RIGHT inferior field Saccades: Decreased speed of saccadic movement Convergence: Impaired - to be further tested in functional context Visual Fields: No apparent deficits Perception  Perception: Impaired Inattention/Neglect: Does not attend to left side of body Praxis Praxis: Impaired Praxis Impairment Details: Motor planning Cognition Overall Cognitive Status: Impaired/Different from baseline Arousal/Alertness: Awake/alert Orientation Level: Place;Situation;Person Year: 2022 Month: October Day of Week: Incorrect (friday, correct date) Memory: Impaired Memory Impairment: Decreased recall of new information;Decreased short term memory Decreased Short Term Memory: Verbal basic;Functional basic Immediate Memory Recall: Sock;Blue;Bed Memory Recall Sock: Not able to recall Memory Recall Blue: With Cue Memory Recall Bed: Not able to recall Attention: Focused;Sustained Focused Attention: Appears intact Sustained Attention: Appears intact Awareness: Impaired Awareness Impairment: Emergent impairment Problem Solving: Impaired Problem Solving Impairment: Verbal basic;Functional basic Safety/Judgment: Impaired Sensation Sensation Light Touch: Appears  Intact Hot/Cold: Appears Intact Proprioception: Impaired by gross assessment Stereognosis: Appears Intact Additional Comments: able to identify LT to L digits, L toes Coordination Gross Motor Movements are Fluid and Coordinated: No Fine Motor Movements are Fluid and Coordinated: No Coordination and Movement Description: L inattention and L hemi impacting movement patterns, Finger Nose Finger Test: unable to complete on L Heel Shin Test: UTA due to LLE weakness Motor  Motor Motor: Motor apraxia;Hemiplegia;Abnormal postural alignment and control Motor - Skilled Clinical Observations: L hemi, L lean in sitting, motor planning deficits during simple functional mobility tasks  Trunk/Postural Assessment  Cervical Assessment Cervical Assessment: Exceptions to [facility] (forward head) Thoracic Assessment Thoracic Assessment: Exceptions to [facility] (rounded shoulders and kyphosis) Lumbar Assessment Lumbar Assessment: Exceptions to [facility] (nonflexibile posterior pelvic tilt in sitting) Postural Control Postural Control: Deficits on evaluation Trunk Control: L lean in sitting - able to correct until outside BOS - difficulty maintaining despite visual and verbal cues Righting Reactions: delayed and inadequate Protective Responses: delayed and inadequate  Balance Balance Balance Assessed: Yes Static Sitting Balance Static Sitting - Balance Support: Feet supported;No upper extremity supported Static Sitting - Level of Assistance: 2: Max assist Static Sitting - Comment/# of Minutes: frequent LOB to L sitting EOB Dynamic Sitting Balance Dynamic Sitting - Balance Support: Feet supported;During functional activity Dynamic Sitting - Level of Assistance: 2: Max assist Static Standing Balance Static Standing - Balance Support: Right upper extremity supported;Bilateral upper extremity supported Static Standing - Level of Assistance: 1: +2 Total assist Extremity/Trunk Assessment RUE Assessment RUE Assessment:  Within Functional Limits LUE Assessment LUE Assessment: Exceptions to [facility] LUE Body System: Neuro Brunstrum levels for arm and hand: Arm;Hand Brunstrum level for arm: Stage III Synergy is performed voluntarily Brunstrum level for hand: Stage IV Movements deviating from synergies;Stage V Independence from basic synergies  Care Tool Care Tool Self Care Eating   Eating Assist Level: Set up assist    Oral Care    Oral Care Assist Level: Supervision/Verbal cueing    Bathing   Body parts bathed by patient: Right arm;Left arm;Chest;Abdomen;Front perineal area;Right upper leg;Left upper leg;Face Body parts bathed by helper: Buttocks;Right lower leg;Left lower leg   Assist Level: Moderate Assistance - Patient 50 - 74%    Upper Body Dressing(including orthotics)   What is the patient wearing?: Pull over shirt   Assist Level: Moderate Assistance - Patient 50 - 74%    Lower Body Dressing (excluding footwear)   What is the patient wearing?: Pants Assist for lower body dressing: Total Assistance - Patient < 25%  Putting on/Taking off footwear   What is the patient wearing?: Non-skid slipper socks Assist for footwear: Total Assistance - Patient < 25%       Care Tool Toileting Toileting activity   Assist for toileting: Dependent - Patient 0% (stedy)     Care Tool Bed Mobility Roll left and right activity   Roll left and right assist level: Maximal Assistance - Patient 25 - 49%    Sit to lying activity   Sit to lying assist level: Total Assistance - Patient < 25%    Lying to sitting on side of bed activity   Lying to sitting on side of bed assist level: the ability to move from lying on the back to sitting on the side of the bed with no back support.: Maximal Assistance - Patient 25 - 49%     Care Tool Transfers Sit to stand transfer   Sit to stand assist level: Total Assistance - Patient < 25%    Chair/bed transfer   Chair/bed transfer assist level: Total Assistance -  Patient < 25%     Toilet transfer   Assist Level: Dependent - Patient 0% (stedy)     Care Tool Cognition  Expression of Ideas and Wants Expression of Ideas and Wants: 3. Some difficulty - exhibits some difficulty with expressing needs and ideas (e.g, some words or finishing thoughts) or speech is not clear  Understanding Verbal and Non-Verbal Content Understanding Verbal and Non-Verbal Content: 3. Usually understands - understands most conversations, but misses some part/intent of message. Requires cues at times to understand   Memory/Recall Ability Memory/Recall Ability : Current season;That he or she is in a hospital/hospital unit   Refer to Care Plan for [place] 1 OT Short Term Goal 1 (Week 1): Pt will maintain static sitting balance EOB with no more than intermittent min A in prep for seated ADL. OT Short Term Goal 2 (Week 1): Pt will completed sit to stand with max of 1 in prep for standing ADL. OT Short Term Goal 3 (Week 1): Pt will don shirt with min A. OT Short Term Goal 4 (Week 1): Pt will don pants with max A. OT Short Term Goal 5 (Week 1): Pt will complete toilet transfer with max and LRAD.  Recommendations for other services: Therapeutic Recreation  Pet therapy, Kitchen group, Stress management, and Outing/community reintegration   Skilled Therapeutic Intervention ADL ADL Eating: Set up Where Assessed-Eating: Wheelchair Grooming: Supervision/safety Where Assessed-Grooming: Sitting at sink Upper Body Bathing: Supervision/safety Where Assessed-Upper Body Bathing: Sitting at sink Lower Body Bathing: Moderate assistance Where Assessed-Lower Body Bathing: Standing at sink Upper Body Dressing: Moderate assistance Where Assessed-Upper Body Dressing: Sitting at sink Lower Body Dressing: Maximal assistance;Dependent Where Assessed-Lower Body Dressing: Standing at sink Toileting: Dependent Where Assessed-Toileting: Glass blower/designer:  Dependent Armed forces technical officer Method: Other (comment) (stedy) Science writer: Raised toilet seat;Grab bars Tub/Shower Transfer: Not assessed Social research officer, government: Not assessed Mobility  Bed Mobility Bed Mobility: Supine to Sit Supine to Sit: Maximal Assistance - Patient - Patient 25-49% Sit to Supine: Total Assistance - Patient < 25% Transfers Sit to Stand: Total Assistance - Patient < 25% Stand to Sit: Total Assistance - Patient < 25%  Session Note: Pt received semi-reclined in bed, no c/o pain, agreeable to OT eval.  Reviewed role of CIR OT, evaluation process, ADL/func mobility retraining, goals for therapy, and safety plan. Evaluation completed as documented above. Came to sitting EOB with max  A to lift trunk, various LOB to his L required intermittent max A to maintain midline sitting EOB with no UE support. Squat-pivot to his R > w/c with total A and RUE support on arm rest. Bathed UB and complete seated grooming with S seated at sink with increased time, able to shave momentary with electric razor using LUE. Donned shirt with mod A to thread RUE and pull head through, total A to don pants. Max for sit to stand at sink with split hand technique, unable to fully come into upright posture. Noted moderate to severe kyphosis. Pt reports hx of "vertigo," but denies sx after sitting up in chair. Agreeable to sit in chair until next therapy. Pt left seated in w/c with safety belt alarm engaged, call bell in reach, and all immediate needs met.   Discharge Criteria: Patient will be discharged from OT if patient refuses treatment 3 consecutive times without medical reason, if treatment goals not met, if there is a change in medical status, if patient makes no progress towards goals or if patient is discharged from hospital.  The above assessment, treatment plan, treatment alternatives and goals were discussed and mutually agreed upon: by patient  Volanda Napoleon MS,  OTR/L  08/01/2021, 12:53 PM

## 2021-08-02 DIAGNOSIS — E1165 Type 2 diabetes mellitus with hyperglycemia: Secondary | ICD-10-CM | POA: Diagnosis not present

## 2021-08-02 DIAGNOSIS — I6381 Other cerebral infarction due to occlusion or stenosis of small artery: Secondary | ICD-10-CM | POA: Diagnosis not present

## 2021-08-02 DIAGNOSIS — I10 Essential (primary) hypertension: Secondary | ICD-10-CM | POA: Diagnosis not present

## 2021-08-02 DIAGNOSIS — N1831 Chronic kidney disease, stage 3a: Secondary | ICD-10-CM | POA: Diagnosis not present

## 2021-08-02 LAB — GLUCOSE, CAPILLARY
Glucose-Capillary: 174 mg/dL — ABNORMAL HIGH (ref 70–99)
Glucose-Capillary: 133 mg/dL — ABNORMAL HIGH (ref 70–99)
Glucose-Capillary: 158 mg/dL — ABNORMAL HIGH (ref 70–99)
Glucose-Capillary: 111 mg/dL — ABNORMAL HIGH (ref 70–99)

## 2021-08-02 MED ORDER — HYDRALAZINE HCL 10 MG PO TABS
10.0000 mg | ORAL_TABLET | Freq: Four times a day (QID) | ORAL | Status: DC | PRN
  Administered 2021-08-03: 10 mg via ORAL
  Filled 2021-08-02: qty 1

## 2021-08-02 NOTE — Progress Notes (Signed)
[place] PHYSICAL MEDICINE & REHABILITATION PROGRESS NOTE  Subjective/Complaints: Patient seen laying in bed this morning.  He is resting comfortably.  Upon waking patient up, he states he did not sleep well overnight, no particular reason.  He states he hopes that he has a day off from therapies today.  ROS: Denies CP, SOB, N/V/D  Objective: Vital Signs: Blood pressure (!) 193/85, pulse 77, temperature 98.2 F (36.8 C), temperature source Oral, resp. rate 16, height 5\' 11"  (1.803 m), weight 74.7 kg, SpO2 100 %. No results found. Recent Labs    07/31/21 1342  WBC 8.2  HGB 14.2  HCT 44.5  PLT 200    Recent Labs    07/31/21 1342  CREATININE 1.28*     Intake/Output Summary (Last 24 hours) at 08/02/2021 0705 Last data filed at 08/02/2021 0551 Gross per 24 hour  Intake 720 ml  Output 700 ml  Net 20 ml         Physical Exam: BP (!) 193/85   Pulse 77   Temp 98.2 F (36.8 C) (Oral)   Resp 16   Ht 5\' 11"  (1.803 m)   Wt 74.7 kg   SpO2 100%   BMI 22.97 kg/m  Constitutional: No distress . Vital signs reviewed. HENT: Normocephalic.  Atraumatic. Eyes: EOMI. No discharge. Cardiovascular: No JVD.  RRR. Respiratory: Normal effort.  No stridor.  Bilateral clear to auscultation. GI: Non-distended.  BS +. Skin: Warm and dry.  Intact. Psych: Normal mood.  Normal behavior. Musc: No edema in extremities.  No tenderness in extremities. Neuro: Alert Makes eye contact with examiner.   Follows commands.   Name and age.   He does have some mild delay in processing. Dysarthria, unchanged Motor: RUE/RLE: 4/5 proximal distal LUE: 4-/5 proximal distal LLE: 1+/5 proximal distal, stable  Assessment/Plan: 1. Functional deficits which require 3+ hours per day of interdisciplinary therapy in a comprehensive inpatient rehab setting. Physiatrist is providing close team supervision and 24 hour management of active medical problems listed below. Physiatrist and rehab team continue  to assess barriers to discharge/monitor patient progress toward functional and medical goals   Care Tool:  Bathing    Body parts bathed by patient: Right arm, Left arm, Chest, Abdomen, Front perineal area, Right upper leg, Left upper leg, Face   Body parts bathed by helper: Buttocks, Right lower leg, Left lower leg     Bathing assist Assist Level: Moderate Assistance - Patient 50 - 74%     Upper Body Dressing/Undressing Upper body dressing   What is the patient wearing?: Pull over shirt    Upper body assist Assist Level: Moderate Assistance - Patient 50 - 74%    Lower Body Dressing/Undressing Lower body dressing      What is the patient wearing?: Pants, Incontinence brief     Lower body assist Assist for lower body dressing: Maximal Assistance - Patient 25 - 49%     Toileting Toileting    Toileting assist Assist for toileting: Moderate Assistance - Patient 50 - 74% (can use urinal but spills it if he doesnt call staff)     Transfers Chair/bed transfer  Transfers assist     Chair/bed transfer assist level: Total Assistance - Patient < 25%     Locomotion Ambulation   Ambulation assist   Ambulation activity did not occur: Safety/medical concerns          Walk 10 feet activity   Assist  Walk 10 feet activity did not occur: Safety/medical concerns  Walk 50 feet activity   Assist Walk 50 feet with 2 turns activity did not occur: Safety/medical concerns         Walk 150 feet activity   Assist Walk 150 feet activity did not occur: Safety/medical concerns         Walk 10 feet on uneven surface  activity   Assist Walk 10 feet on uneven surfaces activity did not occur: Safety/medical concerns         Wheelchair     Assist Is the patient using a wheelchair?: Yes Type of Wheelchair: Manual    Wheelchair assist level: Dependent - Patient 0% Max wheelchair distance: 150'    Wheelchair 50 feet with 2 turns  activity    Assist        Assist Level: Dependent - Patient 0%   Wheelchair 150 feet activity     Assist      Assist Level: Dependent - Patient 0%    Medical Problem List and Plan: 1.  Left-sided hemiparesis secondary to infarct right posterior lentiform nucleus, thalamus, and tail of the caudate with additional punctate infarct in the lateral left thalamus.   Continue CIR 2.  Antithrombotics: -DVT/anticoagulation:  Pharmaceutical: Heparin             -antiplatelet therapy: Aspirin 81 mg daily and Plavix 75 mg daily until 10/30, then Plavix alone. 3. Pain Management: Lyrica 50 mg 3 times daily, hydrocodone as needed             Monitor with increased exertion  Appears controlled on 10/16 4. Mood: Klonopin 0.25 mg 3 times daily, Prozac 20 mg daily             -antipsychotic agents: N/A 5. Neuropsych: This patient is capable of making decisions on his own behalf. 6. Skin/Wound Care: Routine skin checks 7. Fluids/Electrolytes/Nutrition: Routine in and outs             CMP ordered for tomorrow 8.  Hypertension.  Norvasc 5 mg daily, Toprol-XL 25 mg daily.    Blood pressure is labile, but elevated, as needed hydralazine added on 10/16            Monitor with increased mobility 9.  Hyperlipidemia.  Zocor/Zetia 10.  COPD with history of tobacco use.  Continue nebulizers as directed as well as Singulair.  Check oxygen saturations every shift 11.  Diabetes mellitus with hyperglycemia.  Hemoglobin A1c 6.7.  Amaryl 0.5 mg twice daily  Elevated on 10/16, will consider medication adjustments if persistent             Monitor with increased mobility 12.  Right side parotid mass.  Follow-up outpatient ENT 13.  CKD stage III.  Baseline creatinine 1.1-1.3.               Creatinine 1.28 on 10/14, labs ordered for tomorrow 14.  GERD.  Protonix   LOS: 2 days A FACE TO FACE EVALUATION WAS PERFORMED  Jaramie Bastos Lorie Phenix 08/02/2021, 7:05 AM

## 2021-08-03 DIAGNOSIS — I6381 Other cerebral infarction due to occlusion or stenosis of small artery: Secondary | ICD-10-CM | POA: Diagnosis not present

## 2021-08-03 LAB — COMPREHENSIVE METABOLIC PANEL
ALT: 13 U/L (ref 0–44)
AST: 15 U/L (ref 15–41)
Albumin: 2.4 g/dL — ABNORMAL LOW (ref 3.5–5.0)
Alkaline Phosphatase: 36 U/L — ABNORMAL LOW (ref 38–126)
Anion gap: 6 (ref 5–15)
BUN: 30 mg/dL — ABNORMAL HIGH (ref 8–23)
CO2: 25 mmol/L (ref 22–32)
Calcium: 8.5 mg/dL — ABNORMAL LOW (ref 8.9–10.3)
Chloride: 104 mmol/L (ref 98–111)
Creatinine, Ser: 1.42 mg/dL — ABNORMAL HIGH (ref 0.61–1.24)
GFR, Estimated: 48 mL/min — ABNORMAL LOW (ref >60)
Glucose, Bld: 195 mg/dL — ABNORMAL HIGH (ref 70–99)
Potassium: 4.3 mmol/L (ref 3.5–5.1)
Sodium: 135 mmol/L (ref 135–145)
Total Bilirubin: 0.6 mg/dL (ref 0.3–1.2)
Total Protein: 5.4 g/dL — ABNORMAL LOW (ref 6.5–8.1)

## 2021-08-03 LAB — CBC WITH DIFFERENTIAL/PLATELET
Abs Immature Granulocytes: 0.05 10*3/uL (ref 0.00–0.07)
Basophils Absolute: 0.1 10*3/uL (ref 0.0–0.1)
Basophils Relative: 1 %
Eosinophils Absolute: 0.2 10*3/uL (ref 0.0–0.5)
Eosinophils Relative: 2 %
HCT: 40.1 % (ref 39.0–52.0)
Hemoglobin: 13.5 g/dL (ref 13.0–17.0)
Immature Granulocytes: 1 %
Lymphocytes Relative: 16 %
Lymphs Abs: 1.5 10*3/uL (ref 0.7–4.0)
MCH: 31.8 pg (ref 26.0–34.0)
MCHC: 33.7 g/dL (ref 30.0–36.0)
MCV: 94.6 fL (ref 80.0–100.0)
Monocytes Absolute: 1.6 10*3/uL — ABNORMAL HIGH (ref 0.1–1.0)
Monocytes Relative: 16 %
Neutro Abs: 6.1 10*3/uL (ref 1.7–7.7)
Neutrophils Relative %: 64 %
Platelets: 231 10*3/uL (ref 150–400)
RBC: 4.24 MIL/uL (ref 4.22–5.81)
RDW: 13.5 % (ref 11.5–15.5)
WBC: 9.5 10*3/uL (ref 4.0–10.5)
nRBC: 0 % (ref 0.0–0.2)

## 2021-08-03 LAB — GLUCOSE, CAPILLARY
Glucose-Capillary: 180 mg/dL — ABNORMAL HIGH (ref 70–99)
Glucose-Capillary: 118 mg/dL — ABNORMAL HIGH (ref 70–99)
Glucose-Capillary: 175 mg/dL — ABNORMAL HIGH (ref 70–99)
Glucose-Capillary: 133 mg/dL — ABNORMAL HIGH (ref 70–99)

## 2021-08-03 MED ORDER — AMLODIPINE BESYLATE 10 MG PO TABS
10.0000 mg | ORAL_TABLET | Freq: Every day | ORAL | Status: DC
  Administered 2021-08-04 – 2021-08-26 (×23): 10 mg via ORAL
  Filled 2021-08-03 (×23): qty 1

## 2021-08-03 MED ORDER — MAGNESIUM GLUCONATE 500 MG PO TABS
250.0000 mg | ORAL_TABLET | Freq: Every day | ORAL | Status: DC
  Administered 2021-08-03 – 2021-08-25 (×23): 250 mg via ORAL
  Filled 2021-08-03 (×23): qty 1

## 2021-08-03 MED ORDER — B COMPLEX-C PO TABS
1.0000 | ORAL_TABLET | Freq: Every day | ORAL | Status: DC
  Administered 2021-08-03 – 2021-08-26 (×24): 1 via ORAL
  Filled 2021-08-03 (×24): qty 1

## 2021-08-03 NOTE — Progress Notes (Signed)
[place] PHYSICAL MEDICINE & REHABILITATION PROGRESS NOTE  Subjective/Complaints: BP up to 193/85 this weekend- says this is unusual for him. Amlodipine increased to 10mg .  Says he had a good weekend + pain right great toe- has bunion  ROS: Denies CP, SOB, N/V/D, +right great toe pain  Objective: Vital Signs: Blood pressure (!) 179/76, pulse 73, temperature 98 F (36.7 C), resp. rate 18, height 5\' 11"  (1.803 m), weight 74.7 kg, SpO2 100 %. No results found. Recent Labs    07/31/21 1342 08/03/21 0543  WBC 8.2 9.5  HGB 14.2 13.5  HCT 44.5 40.1  PLT 200 231   Recent Labs    07/31/21 1342 08/03/21 0543  NA  --  135  K  --  4.3  CL  --  104  CO2  --  25  GLUCOSE  --  195*  BUN  --  30*  CREATININE 1.28* 1.42*  CALCIUM  --  8.5*    Intake/Output Summary (Last 24 hours) at 08/03/2021 0840 Last data filed at 08/03/2021 0700 Gross per 24 hour  Intake 720 ml  Output 1025 ml  Net -305 ml        Physical Exam: BP (!) 179/76 (BP Location: Left Arm)   Pulse 73   Temp 98 F (36.7 C)   Resp 18   Ht 5\' 11"  (1.803 m)   Wt 74.7 kg   SpO2 100%   BMI 22.97 kg/m  Gen: no distress, normal appearing HEENT: oral mucosa pink and moist, NCAT Cardio: Reg rate Chest: normal effort, normal rate of breathing Abd: soft, non-distended Ext: no edema Psych: pleasant, normal affect Skin: intact Neuro: Makes eye contact with examiner.   Follows commands.   Name and age.   He does have some mild delay in processing. Dysarthria, unchanged Motor: RUE/RLE: 4/5 proximal distal LUE: 4-/5 proximal distal LLE: 1+/5 proximal distal, stable  Assessment/Plan: 1. Functional deficits which require 3+ hours per day of interdisciplinary therapy in a comprehensive inpatient rehab setting. Physiatrist is providing close team supervision and 24 hour management of active medical problems listed below. Physiatrist and rehab team continue to assess barriers to discharge/monitor patient  progress toward functional and medical goals   Care Tool:  Bathing    Body parts bathed by patient: Right arm, Left arm, Chest, Abdomen, Front perineal area, Right upper leg, Left upper leg, Face   Body parts bathed by helper: Buttocks, Right lower leg, Left lower leg     Bathing assist Assist Level: Moderate Assistance - Patient 50 - 74%     Upper Body Dressing/Undressing Upper body dressing   What is the patient wearing?: Pull over shirt    Upper body assist Assist Level: Moderate Assistance - Patient 50 - 74%    Lower Body Dressing/Undressing Lower body dressing      What is the patient wearing?: Pants, Incontinence brief     Lower body assist Assist for lower body dressing: Maximal Assistance - Patient 25 - 49%     Toileting Toileting    Toileting assist Assist for toileting: Moderate Assistance - Patient 50 - 74%     Transfers Chair/bed transfer  Transfers assist     Chair/bed transfer assist level: Total Assistance - Patient < 25%     Locomotion Ambulation   Ambulation assist   Ambulation activity did not occur: Safety/medical concerns          Walk 10 feet activity   Assist  Walk 10 feet activity did  not occur: Safety/medical concerns        Walk 50 feet activity   Assist Walk 50 feet with 2 turns activity did not occur: Safety/medical concerns         Walk 150 feet activity   Assist Walk 150 feet activity did not occur: Safety/medical concerns         Walk 10 feet on uneven surface  activity   Assist Walk 10 feet on uneven surfaces activity did not occur: Safety/medical concerns         Wheelchair     Assist Is the patient using a wheelchair?: Yes Type of Wheelchair: Manual    Wheelchair assist level: Dependent - Patient 0% Max wheelchair distance: 150'    Wheelchair 50 feet with 2 turns activity    Assist        Assist Level: Dependent - Patient 0%   Wheelchair 150 feet activity      Assist      Assist Level: Dependent - Patient 0%    Medical Problem List and Plan: 1.  Left-sided hemiparesis secondary to infarct right posterior lentiform nucleus, thalamus, and tail of the caudate with additional punctate infarct in the lateral left thalamus.   Continue CIR 2.  Antithrombotics: -DVT/anticoagulation:  Pharmaceutical: Heparin             -antiplatelet therapy: Aspirin 81 mg daily and Plavix 75 mg daily until 10/30, then Plavix alone. 3. Pain Management: Lyrica 50 mg 3 times daily, hydrocodone as needed             Monitor with increased exertion  Appears controlled on 10/17 4. Mood: Klonopin 0.25 mg 3 times daily, Prozac 20 mg daily             -antipsychotic agents: N/A 5. Neuropsych: This patient is capable of making decisions on his own behalf. 6. Skin/Wound Care: Routine skin checks 7. Fluids/Electrolytes/Nutrition: Routine in and outs             CMP ordered for tomorrow 8.  Hypertension.  Norvasc 5 mg daily, Toprol-XL 25 mg daily.    Blood pressure is labile, but elevated, as needed hydralazine added on 10/16  Increase Norvasc to 10mg .             Monitor with increased mobility 9.  Hyperlipidemia.  Zocor/Zetia 10.  COPD with history of tobacco use.  Continue nebulizers as directed as well as Singulair.  Check oxygen saturations every shift 11.  Diabetes mellitus with hyperglycemia.  Hemoglobin A1c 6.7.  Amaryl 0.5 mg twice daily  Elevated on 10/16, will consider medication adjustments if persistent             Monitor with increased mobility 12.  Right side parotid mass.  Follow-up outpatient ENT 13.  CKD stage III.  Baseline creatinine 1.1-1.3.               Cr up to 1.41- place nursing order to encourage 6-8 glasses of water per day and repeat creatinine tomorrow.  14.  GERD.  Protonix 15. Right breat toe bunion: requested ortho tech eval for orthotic. Discussed benefits of icing.    LOS: 3 days A FACE TO FACE EVALUATION WAS  PERFORMED  Aarron Wierzbicki P Daxon Kyne 08/03/2021, 8:40 AM

## 2021-08-03 NOTE — Progress Notes (Signed)
Inpatient Rehabilitation  Patient information reviewed and entered into eRehab system by Felesia Stahlecker Andriy Sherk, OTR/L.   Information including medical coding, functional ability and quality indicators will be reviewed and updated through discharge.    

## 2021-08-03 NOTE — Progress Notes (Signed)
Occupational Therapy Session Note  Patient Details  Name: Barry [name] MRN: [id number] Date of Birth: Mar 09, 1933  Today's Date: 08/03/2021 OT Individual Time: 0905-1005 OT Individual Time Calculation (min): 60 min    Short Term Goals: Week 1:  OT Short Term Goal 1 (Week 1): Pt will maintain static sitting balance EOB with no more than intermittent min A in prep for seated ADL. OT Short Term Goal 2 (Week 1): Pt will completed sit to stand with max of 1 in prep for standing ADL. OT Short Term Goal 3 (Week 1): Pt will don shirt with min A. OT Short Term Goal 4 (Week 1): Pt will don pants with max A. OT Short Term Goal 5 (Week 1): Pt will complete toilet transfer with max and LRAD.  Skilled Therapeutic Interventions/Progress Updates:    Pt semi reclined in bed, no c/o pain, reports good awareness of deficits.  Pt agreeable to washing up and changing clothes during OT session.  Mod assist supine to sidelying and sidelying to sit using bed rails and step by step Vcs for body mechanics.  Pt donned socks sitting EOB needing min assist to maintain dynamic balance due to left and forward lean at times.  Pt also needed mod assist to maintain LLE into figure 4 while donning sock for safety.  Squat pivot EOB to w/c with mod assist.  Transported pt to sink and pt needing min multimodal cues for organizing, problem solving, and initiation.  Oral hygiene completed with supervision.  Doffed shirt overhead with supervision as well. Pt bathed UB with min assist for back.  LB completed not including buttocks in seated with min assist for LLE.  Donned pants with max assist at sit<>stand level needing left knee blocked for support.  Tactile cues provided to facilitate improved upright posture when standing due to forward lean and downward head gaze preference likely secondary to fear of falling and weakness.  Pt reported feeling "swimmy headed upon standing".  Slightly improved when returned to sitting, however  also complaining of heartburn symptoms.  Notified nurse and vitals assessed.  BP 187/94 (121) pulse 69.  Nurse made aware of vital signs.  Pt requesting to sit up in w/c at end of session.  Call bell in reach, seat alarm on.    Therapy Documentation Precautions:  Precautions Precautions: Fall Precaution Comments: L hemi, L inattention, HOH Restrictions Weight Bearing Restrictions: No    Therapy/Group: Individual Therapy  [name] 08/03/2021, 10:50 AM

## 2021-08-03 NOTE — Progress Notes (Signed)
Orthopedic Tech Progress Note Patient Details:  Barry [name] [facility] 12/03/1932 [id number]  Called in order to [employer] for a GREAT TOE BUNION   Patient ID: Barry [name], male   DOB: 07/24/1933, 87 y.o.   MRN: [id number]  [name] 08/03/2021, 3:37 PM

## 2021-08-03 NOTE — Progress Notes (Signed)
Patient ID: Barry [name], male   DOB: 09-01-1933, 87 y.o.   MRN: [id number] Met with the patient and friend to introduce self and nurse care manager role. Reviewed rehab process and plan of care. Discussed secondary risks including DM (A1C 6.7), HTN, HLD (LDL 84) and smoking. Reviewed DAPT and smoking cessation along with dietary modification recommendations. Continue to follow along to discharge to address educational needs/questions and facilitate preparation for discharge. Margarito Liner

## 2021-08-03 NOTE — Progress Notes (Signed)
[place] Individual Statement of Services  Patient Name:  Barry [name]  Date:  08/03/2021  Welcome to the [facility].  Our goal is to provide you with an individualized program based on your diagnosis and situation, designed to meet your specific needs.  With this comprehensive rehabilitation program, you will be expected to participate in at least 3 hours of rehabilitation therapies Monday-Friday, with modified therapy programming on the weekends.  Your rehabilitation program will include the following services:  Physical Therapy (PT), Occupational Therapy (OT), Speech Therapy (ST), 24 hour per day rehabilitation nursing, Neuropsychology, Care Coordinator, Rehabilitation Medicine, Nutrition Services, and Pharmacy Services  Weekly team conferences will be held on Tuesday to discuss your progress.  Your Inpatient Rehabilitation Care Coordinator will talk with you frequently to get your input and to update you on team discussions.  Team conferences with you and your family in attendance may also be held.  Expected length of stay: 3-4 weeks  Overall anticipated outcome: overall min assist level  Depending on your progress and recovery, your program may change. Your Inpatient Rehabilitation Care Coordinator will coordinate services and will keep you informed of any changes. Your Inpatient Rehabilitation Care Coordinator's name and contact numbers are listed  below.  The following services may also be recommended but are not provided by the [facility] will be made to provide these services after discharge if needed.  Arrangements include referral to agencies that provide these services.  Your insurance has been verified to be:  [employer] Your primary doctor is:  Jenna Luo  Pertinent information  will be shared with your doctor and your insurance company.  Inpatient Rehabilitation Care Coordinator:  Ovidio Kin, [place] or Emilia Beck  Information discussed with and copy given to patient by: Elease Hashimoto, 08/03/2021, 1:08 PM

## 2021-08-03 NOTE — Progress Notes (Signed)
Speech Language Pathology Daily Session Note  Patient Details  Name: Barry [name] MRN: [id number] Date of Birth: 1933/04/19  Today's Date: 08/03/2021 SLP Individual Time: 1400-1500 SLP Individual Time Calculation (min): 60 min  Short Term Goals: Week 1: SLP Short Term Goal 1 (Week 1): Patient will demonstrate functional problem solving for basic and familiar tasks with Min A verbal cues. SLP Short Term Goal 2 (Week 1): Patient will utilize external memory aids to recall functional information with Mod A multimodal cues. SLP Short Term Goal 3 (Week 1): Patient will self-monitor and correct errors during functional tasks with Mod A verbal cues. SLP Short Term Goal 4 (Week 1): Patient will attend to left visual field/body during functional tasks with Min verbal cues.  Skilled Therapeutic Interventions: Pt seen for skilled ST with focus on cognitive goals, pt seated in wheelchair, very pleasant and agreeable to therapeutic tasks. SLP facilitating further assessment of cognitive linguistic function with portions of ALFA; "Counting Money" - 30% "Solving Daily Math Problems" - 40%. Pt needed extra time and clarification of directions during testing, frequently stating he was "feeling nervous" with decreased awareness into error responses. Pt repeating "I can manage my money no problem", SLP providing education on how CVA appears to have impacted cognition and resulting mathematic deficits. Pt does have very supportive family and he states his son can assist with higher level cognitive tasks as needed. Pt will benefit from ongoing education re: deficits s/p CVA, compensatory strategies and error awareness. Pt left in wheelchair with alarm belt present and all needs within reach. Cont ST POC.   Pain Pain Assessment Pain Scale: 0-10 Pain Score: 0-No pain  Therapy/Group: Individual Therapy  [name] 08/03/2021, 3:03 PM

## 2021-08-03 NOTE — IPOC Note (Signed)
Overall Plan of Care [facility]) Patient Details Name: Barry [name] MRN: [id number] DOB: 1933/04/01  Admitting Diagnosis: Right thalamic infarction [facility])  Hospital Problems: Principal Problem:   Right thalamic infarction [facility])     Functional Problem List: Nursing Medication Management, Safety, Bowel, Endurance, Pain  PT Balance, Motor, Endurance, Safety, Pain, Skin Integrity, Sensory  OT Balance, Motor, Cognition, Endurance, Safety, Perception, Skin Integrity  SLP    TR         Basic ADL's: OT Eating, Grooming, Bathing, Dressing, Toileting     Advanced  ADL's: OT       Transfers: PT Bed Mobility, Bed to Chair, Car  OT Toilet, Tub/Shower     Locomotion: PT Ambulation, Stairs     Additional Impairments: OT Fuctional Use of Upper Extremity  SLP Social Cognition   Problem Solving, Memory, Awareness  TR      Anticipated Outcomes Item Anticipated Outcome  Self Feeding mod I  Swallowing      Basic self-care  min A  Toileting  min A   Bathroom Transfers min A  Bowel/Bladder  Manage bowel w mod I assist  Transfers  minA with LRAD  Locomotion  minA with LRAD  Communication     Cognition  Supervision  Pain  at or below level 4  Safety/Judgment  maintain w cues   Therapy Plan: PT Intensity: Minimum of 1-2 x/day ,45 to 90 minutes PT Frequency: 5 out of 7 days PT Duration Estimated Length of Stay: 3-4 weeks OT Intensity: Minimum of 1-2 x/day, 45 to 90 minutes OT Frequency: 5 out of 7 days OT Duration/Estimated Length of Stay: 3.5 to 4 weeks SLP Intensity: Minumum of 1-2 x/day, 30 to 90 minutes SLP Frequency: 3 to 5 out of 7 days SLP Duration/Estimated Length of Stay: 3-4 weeks   Due to the current state of emergency, patients may not be receiving their 3-hours of Medicare-mandated therapy.   Team Interventions: Nursing Interventions Disease Management/Prevention, Medication Management, Discharge Planning, Pain Management, Bowel Management,  Patient/Family Education  PT interventions Ambulation/gait training, Cognitive remediation/compensation, Discharge planning, DME/adaptive equipment instruction, Functional mobility training, Pain management, Psychosocial support, Splinting/orthotics, Therapeutic Activities, UE/LE Strength taining/ROM, Visual/perceptual remediation/compensation, Wheelchair propulsion/positioning, UE/LE Coordination activities, Therapeutic Exercise, Stair training, Skin care/wound management, Patient/family education, Neuromuscular re-education, Functional electrical stimulation, Disease management/prevention, Academic librarian, Training and development officer  OT Interventions Training and development officer, Community reintegration, Disease mangement/prevention, Technical sales engineer stimulation, Neuromuscular re-education, Barrister's clerk education, Self Care/advanced ADL retraining, Splinting/orthotics, Therapeutic Exercise, UE/LE Coordination activities, Wheelchair propulsion/positioning, UE/LE Strength taining/ROM, Therapeutic Activities, Psychosocial support, Pain management, Functional mobility training, DME/adaptive equipment instruction, Discharge planning, Cognitive remediation/compensation  SLP Interventions Cognitive remediation/compensation, Internal/external aids, Therapeutic Activities, Cueing hierarchy, Environmental controls, Functional tasks, Patient/family education  TR Interventions    SW/CM Interventions Discharge Planning, Psychosocial Support, Patient/Family Education   Barriers to Discharge MD  Medical stability  Nursing Home environment access/layout, Lack of/limited family support, Medication compliance 3 ste to mobile home bil rails solo; son works, plans to hire help to assist when at work  PT Decreased caregiver support, Tree surgeon, Insurance underwriter for SNF coverage    OT Inaccessible home environment    SLP      SW Lack of/limited family support Both son and daughter in-law  work   International aid/development worker: Destination: PT-Home ,OT- Home , SLP-Home Projected Follow-up: PT-Home health PT, 24 hour supervision/assistance, OT-  Home health OT, SLP-24 hour supervision/assistance, Outpatient SLP, [place] Needs: PT-To be determined, OT- To be determined, SLP-None  recommended by SLP Equipment Details: PT-Pt owns a SPC, no other DME, OT-  Patient/family involved in discharge planning: PT- Patient, Family member/caregiver,  OT-Patient, SLP-Patient, Family member/caregiver  MD ELOS: 13-16 days Medical Rehab Prognosis:  Excellent Assessment: [name] is an 87 year old man who is admitted to CIR with left sided hemiparesis secondary to right posterior lentiform nucleus, thalamus, and tail or caudate infarcts. Medications are being managed, and labs and vitals are being monitored regularly.      See Team Conference Notes for weekly updates to the plan of care

## 2021-08-03 NOTE — Progress Notes (Signed)
Inpatient Rehabilitation Care Coordinator Assessment and Plan Patient Details  Name: Barry [name] MRN: [id number] Date of Birth: [birth year]  Today's Date: 08/03/2021  Hospital Problems: Principal Problem:   Right thalamic infarction [facility])  Past Medical History:  Past Medical History:  Diagnosis Date   AAA (abdominal aortic aneurysm) 3-09   4.7 cm   Allergy    rhinitis   Anxiety    Cancer ([place])    skin   Cataract    Depression    Diabetes mellitus    Diverticulosis    GERD (gastroesophageal reflux disease)    Hepatitis    Hx of colonic polyps 08/26/2015   Hyperlipidemia    Hypertension    Nephrolithiasis    Personal history of colonic polyps    adenomas, serrated also   PVD (peripheral vascular disease) ([place])    Tobacco abuse    Past Surgical History:  Past Surgical History:  Procedure Laterality Date   COLONOSCOPY  2006, 2008, 2010, 05/27/2011   numerous adenomas - 13 in 2006, 3, 2008, 2 2012 (up to 1 cm), 4 diminutive adenomas, serrated adenomas 2012. Diverticulosis and hemorrhoids also.   EYE SURGERY     EYE SURGERY Left    Kidney stone operation     lower limb amputation, other toe 5th     percutaneous stent graft repair of infrarenal AAA  11/10   5.6 cm (T. early)   surgical excision basal cell carcinoma     TEE WITHOUT CARDIOVERSION N/A 07/29/2021   Procedure: TRANSESOPHAGEAL ECHOCARDIOGRAM (TEE);  Surgeon: Geralynn Rile, MD;  Location: [facility];  Service: Cardiovascular;  Laterality: N/A;   tympanic eardrum repair     VASECTOMY     Social History:  reports that he has been smoking cigarettes. He has a 60.00 pack-year smoking history. He has never used smokeless tobacco. He reports that he does not drink alcohol and does not use drugs.  Family / Support Systems Marital Status: Widow/Widower How Long?: 1989 Patient Roles: Parent Children: Darryl-son [phone]  Daughter in [place] has health issues Other Supports: Daughter in-law Anticipated  Caregiver: Son and daughter in-law plan to hire caregivers while they work Ability/Limitations of Caregiver: Both work from [employer] Caregiver Availability: Other (Comment) (Coming up with a 24/7 plan) Family Dynamics: Close knit family his daughter who lives in [place] has health issues and can not assist. His son and daughter in-law are willing to assist him at discharge and take him to their home. Although he would rather go to his home.  Social History Preferred language: English Religion: Baptist Cultural Background: No issues Education: HS Health Literacy - How often do you need to have someone help you when you read instructions, pamphlets, or other written material from your doctor or pharmacy?: Never Writes: Yes Employment Status: Retired Public relations account executive Issues: No issues Guardian/Conservator: None-according to MD pt is capable of making his own decisions while here. Will include son due to will be assisting at discharge.   Abuse/Neglect Abuse/Neglect Assessment Can Be Completed: Yes Physical Abuse: Denies Verbal Abuse: Denies Sexual Abuse: Denies Exploitation of patient/patient's resources: Denies Self-Neglect: Denies  Patient response to: Social Isolation - How often do you feel lonely or isolated from those around you?: Never  Emotional Status Pt's affect, behavior and adjustment status: Pt is motivated to improve and get back to his home eventually. He likes doing for himself and is set in his ways according to him. He likes being alone and independent. Will see how well  he does here and go from there. He is getting movement back on his left side. Recent Psychosocial Issues: other health issues Psychiatric History: History of depression he reports doesn't take medications for this, but feels he is doing ok. May benefit from seeing neuro-psych while here. Substance Abuse History: Tobacco aware not good and the health issues. Plus he has COPD  Patient / Family  Perceptions, Expectations & Goals Pt/Family understanding of illness & functional limitations: Pt and son can explain his stroke and deficits from this, he is getting movement back on his left side which is encouraging to him. He talks with the MD as does son, so feel have a good understanding of his treatment plan going forward. 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, grandfather, home owner, church member, neighbor, Social research officer, government Anticipated changes in roles/activities/participation: resume Pt/family expectations/goals: Pt states: " I want to be able to go to my home and take care of myself, at first I'm going to my son's."  Son states: " We will make arrangements for him but know he would rather be home."  [employer]: None Premorbid Home Care/DME Agencies: Other (Comment) (has rw, wc and cane) Transportation available at discharge: Pt drove, but will need to rely upon son now Is the patient able to respond to transportation needs?: Yes In the past 12 months, has lack of transportation kept you from medical appointments or from getting medications?: No In the past 12 months, has lack of transportation kept you from meetings, work, or from getting things needed for daily living?: No Resource referrals recommended: Neuropsychology  Discharge Planning Living Arrangements: Alone Support Systems: Children, Other relatives, Friends/neighbors, Social worker community Type of Residence: Private residence Insurance Resources: [employer], Multimedia programmer (specify) ([place]) Museum/gallery curator Resources: Radio broadcast assistant Screen Referred: No Living Expenses: Own Money Management: Patient Does the patient have any problems obtaining your medications?: No Home Management: Patient Patient/Family Preliminary Plans: Plan now to go to son's home and see how he does, they plan to hire assist while they work. Pt eventually hopes to return to his home, but is in agreement with  this plan for discharge. Care Coordinator Barriers to Discharge: Lack of/limited family support Care Coordinator Barriers to Discharge Comments: Both son and daughter in-law work Glass blower/designer Anticipated Follow Up Needs: [place] gentleman who is motivated to regain his independence and eventually return to his home if possible. He is on board to go to son's at discharge and see how he does. Will update him tomorrow after team conference.  Elease Hashimoto 08/03/2021, 1:04 PM

## 2021-08-03 NOTE — Progress Notes (Signed)
Physical Therapy Session Note  Patient Details  Name: Barry [name] MRN: [id number] Date of Birth: [birth year]  Today's Date: 08/03/2021 PT Individual Time: 3818-4037 PT Individual Time Calculation (min): 54 min   Short Term Goals: Week 1:  PT Short Term Goal 1 (Week 1): Pt will complete bed mobility with maxA PT Short Term Goal 2 (Week 1): Pt will complete bed<>chair transfers with maxA and LRAD PT Short Term Goal 3 (Week 1): Pt will complete sit<>stand wtih maxA and LRAD PT Short Term Goal 4 (Week 1): Pt will demonstrate improved awareness of L side during functional mobility tasks  Skilled Therapeutic Interventions/Progress Updates:   Received pt sitting in WC, pt agreeable to PT treatment, and denied any pain during session. Session with emphasis on functional mobility/transfers, generalized strengthening, dynamic standing balance/coordination, NMR, and improved activity tolerance. No +2 available during session. Donned shoes with max A for time management purposes and pt transported to [facility]o gym in [facility] total A for time management purposes. Pt performed squat<>pivot transfer to R with mod A and cues for technique and able to maintain static sitting balance on mat with heavy BUE support and close supervision during session. Worked on standing tolerance using mirror for visual feedback. Sit<>stand with LUE supported around therapist and heavy max A - mod cues and manual facilitation for cervical/thoracic/hip extension, anterior weight shifting, and weight shifting to R to correct slight pushing, all with therapist blocking L knee. Took seated rest break on mat, then performed sit<>stands x 5 additional trials with mod A of 1 with LUE around therapist and blocking/guarding L knee. Pt demonstrated excellent carry over with cues for technique when standing. Attempted to step RLE forward but unable to weight shift enough onto LLE to clear RLE - pt also fearful of "falling on therapist" demonstrating  awareness of L lateral lean. Worked on mini-squats x 5 reps with LUE supported around therapist and mod/max A with emphasis on eccentric control and quad strength; pt with strong L lateral lean with squats and demonstrating posterior bias. Squat<>pivot mat<>WC to R with heavy min A and cues for technique and performed WC mobility 71ft using BUE and supervision with emphasis on LLE DF and hamstring strengthening - cues for L attention as pt running into wall on L side. Concluded session with pt sitting in WC, needs within reach, and seatbelt alarm on.  Therapy Documentation Precautions:  Precautions Precautions: Fall Precaution Comments: L hemi, L inattention, HOH Restrictions Weight Bearing Restrictions: No  Therapy/Group: Individual Therapy Alfonse Alpers PT, DPT   08/03/2021, 7:39 AM

## 2021-08-04 LAB — GLUCOSE, CAPILLARY
Glucose-Capillary: 130 mg/dL — ABNORMAL HIGH (ref 70–99)
Glucose-Capillary: 121 mg/dL — ABNORMAL HIGH (ref 70–99)
Glucose-Capillary: 148 mg/dL — ABNORMAL HIGH (ref 70–99)
Glucose-Capillary: 156 mg/dL — ABNORMAL HIGH (ref 70–99)

## 2021-08-04 LAB — BASIC METABOLIC PANEL
Anion gap: 6 (ref 5–15)
BUN: 24 mg/dL — ABNORMAL HIGH (ref 8–23)
CO2: 26 mmol/L (ref 22–32)
Calcium: 8.5 mg/dL — ABNORMAL LOW (ref 8.9–10.3)
Chloride: 103 mmol/L (ref 98–111)
Creatinine, Ser: 1.1 mg/dL (ref 0.61–1.24)
GFR, Estimated: >60 mL/min (ref >60)
Glucose, Bld: 157 mg/dL — ABNORMAL HIGH (ref 70–99)
Potassium: 4.1 mmol/L (ref 3.5–5.1)
Sodium: 135 mmol/L (ref 135–145)

## 2021-08-04 LAB — MAGNESIUM: Magnesium: 2 mg/dL (ref 1.7–2.4)

## 2021-08-04 LAB — VITAMIN D 25 HYDROXY (VIT D DEFICIENCY, FRACTURES): Vit D, 25-Hydroxy: 23.1 ng/mL — ABNORMAL LOW (ref 30–100)

## 2021-08-04 MED ORDER — METOPROLOL SUCCINATE ER 50 MG PO TB24
50.0000 mg | ORAL_TABLET | Freq: Every day | ORAL | Status: DC
  Administered 2021-08-05 – 2021-08-07 (×3): 50 mg via ORAL
  Filled 2021-08-04 (×3): qty 1

## 2021-08-04 NOTE — Progress Notes (Signed)
Speech Language Pathology Daily Session Note  Patient Details  Name: Barry [name] MRN: [id number] Date of Birth: 06-06-1933  Today's Date: 08/04/2021 SLP Individual Time: 1401-1500 SLP Individual Time Calculation (min): 59 min  Short Term Goals: Week 1: SLP Short Term Goal 1 (Week 1): Patient will demonstrate functional problem solving for basic and familiar tasks with Min A verbal cues. SLP Short Term Goal 2 (Week 1): Patient will utilize external memory aids to recall functional information with Mod A multimodal cues. SLP Short Term Goal 3 (Week 1): Patient will self-monitor and correct errors during functional tasks with Mod A verbal cues. SLP Short Term Goal 4 (Week 1): Patient will attend to left visual field/body during functional tasks with Min verbal cues.  Skilled Therapeutic Interventions: Pt seen for skilled ST with focus on cognitive goals, pt upright in wheelchair and agreeable to therapy. SLP providing patient with current medication list to facilitate medication management tasks. Pt able to confirm ~25% of medications on list, stating "I don't pay attention to their names when I take them". Pt states his method of Iding medications is not via name but instead matching them to pictures of the pill and name he keeps in a picture frame. SLP providing education on importance of understanding medication name, dosage, reason for taking and frequency to insure compliance. SLP facilitating sorting of TID pillbox by providing mod A cues for error awareness. Pt will benefit from ongoing practice and training. Pt left in chair with alarm belt present and all needs within reach. Cont ST POC.   Pain Pain Assessment Pain Scale: 0-10 Pain Score: 0-No pain  Therapy/Group: Individual Therapy  [name] 08/04/2021, 2:58 PM

## 2021-08-04 NOTE — Progress Notes (Signed)
Physical Therapy Session Note  Patient Details  Name: Barry [name] MRN: [id number] Date of Birth: 06-Apr-1933  Today's Date: 08/04/2021 PT Individual Time: 1100-1157 PT Individual Time Calculation (min): 57 min   Short Term Goals: Week 1:  PT Short Term Goal 1 (Week 1): Pt will complete bed mobility with maxA PT Short Term Goal 2 (Week 1): Pt will complete bed<>chair transfers with maxA and LRAD PT Short Term Goal 3 (Week 1): Pt will complete sit<>stand wtih maxA and LRAD PT Short Term Goal 4 (Week 1): Pt will demonstrate improved awareness of L side during functional mobility tasks  Skilled Therapeutic Interventions/Progress Updates:     Pt presents seated in w/c at start of session - somewhat hard of hearing but he's agreeable to PT treatment. Denies pain but does endorse fatigue from earlier OT session. Transported to main rehab gym for time in his w/c and setup in // bars. Completed 1x5 sit<>stands in // bars with modA, progressing to minA, with L knee block and pt pulling himself up to stand from bars. For each stand, worked on standing tolerance/balance and postural awareness. He stands with hips rotated to his L and required facilitation for correction - able to stand for 2-3 minutes per stand. Next, worked on functional gait training in // bars with modA overall with +2 assist for w/c follow for safety. He ambulated length of bars x3 times with seated rest break b/w bouts. He required cues for upright posture and lateral weight shift to offload LLE during swing. He required facilitation for LLE swing and LLE placement due to difficulty with foot placement - required assist for blocking L knee during stance although he was able to initiation quad activation to achieve adequate knee extension. Next, wheeled to ortho rehab gym and practiced car transfers. He required mod to maxA for completing squat<>pivot into passenger side of care with L knee block, towards paretic L side. He required  verbal cues for repositioning in the car. He also needed assist for LLE management into and out of the car. Next, worked on seated UE strengthening to work more on L attention and LUE strengthening. Completed 1x15 front arm and chest press with 2# dowel rod - instructed to use dowel rod and visual cue for keeping bar level and avoid L dropping. Pt returned to his room at completion of session and remained seated in w/c for lunch. Safety belt alarm on, all needs within reach.  Therapy Documentation Precautions:  Precautions Precautions: Fall Precaution Comments: L hemi, L inattention, HOH Restrictions Weight Bearing Restrictions: No General:    Therapy/Group: Individual Therapy  [name] 08/04/2021, 7:33 AM

## 2021-08-04 NOTE — Progress Notes (Signed)
Occupational Therapy Session Note  Patient Details  Name: Barry [name] MRN: [id number] Date of Birth: 22-Sep-1933  Today's Date: 08/04/2021 OT Individual Time: 0907-1005 OT Individual Time Calculation (min): 58 min    Short Term Goals: Week 1:  OT Short Term Goal 1 (Week 1): Pt will maintain static sitting balance EOB with no more than intermittent min A in prep for seated ADL. OT Short Term Goal 2 (Week 1): Pt will completed sit to stand with max of 1 in prep for standing ADL. OT Short Term Goal 3 (Week 1): Pt will don shirt with min A. OT Short Term Goal 4 (Week 1): Pt will don pants with max A. OT Short Term Goal 5 (Week 1): Pt will complete toilet transfer with max and LRAD.  Skilled Therapeutic Interventions/Progress Updates:    Pt semi reclined in bed, no c/o pain, stating repetitively throughout session he feels "weak this morning" because he laid in bed all night.  Encouraged pt and provided therapeutic use of self throughout as needed.  Pt completed supine to sit with step by step vcs, use of bed rail and min assist.  Squat pivot EOB to w/c with mod assist.  Transported to bathroom, squat pivot w/c to shower bench with max assist.  Doffed shirt with supervision.  Doffed pants and brief at sit<>level using grab bars with total assist.  Pt bathed UB with min assist and LB with max assist using lateral leans after instruction on technique provided.  Pt intermittently leaning to left posterior needing multimodal cues and mod assist to maintain balance.  Severe posterior pelvic tilt also with max cueing to correct and only briefly able to correct it.  Pt donned shirt with min assist to pull down in back.  Donned brief and pants with total assist in standing.  Requesting to use toilet.  Squat pivot w/c<>commode with max assist.  Unable to have successful bowel movement.  Pt sitting up in w/c, call bell in reach, seat alarm on at end of session.  Therapy Documentation Precautions:   Precautions Precautions: Fall Precaution Comments: L hemi, L inattention, HOH Restrictions Weight Bearing Restrictions: No   Therapy/Group: Individual Therapy  [name] 08/04/2021, 12:16 PM

## 2021-08-04 NOTE — Progress Notes (Signed)
[place] PHYSICAL MEDICINE & REHABILITATION PROGRESS NOTE  Subjective/Complaints: Refused offloading orthotic from [place].  BP is still elevated but improved Cr normalized [place] this morning.   ROS: Denies CP, SOB, N/V/D, +right great toe pain  Objective: Vital Signs: Blood pressure (!) 172/76, pulse 64, temperature 98.7 F (37.1 C), temperature source Oral, resp. rate 16, height 5\' 11"  (1.803 m), weight 74.7 kg, SpO2 97 %. No results found. Recent Labs    08/03/21 0543  WBC 9.5  HGB 13.5  HCT 40.1  PLT 231   Recent Labs    08/03/21 0543 08/04/21 0520  NA 135 135  K 4.3 4.1  CL 104 103  CO2 25 26  GLUCOSE 195* 157*  BUN 30* 24*  CREATININE 1.42* 1.10  CALCIUM 8.5* 8.5*    Intake/Output Summary (Last 24 hours) at 08/04/2021 1106 Last data filed at 08/04/2021 0500 Gross per 24 hour  Intake 477 ml  Output 1400 ml  Net -923 ml        Physical Exam: BP (!) 172/76 (BP Location: Right Arm)   Pulse 64   Temp 98.7 F (37.1 C) (Oral)   Resp 16   Ht 5\' 11"  (1.803 m)   Wt 74.7 kg   SpO2 97%   BMI 22.97 kg/m  Gen: no distress, normal appearing HEENT: oral mucosa pink and moist, NCAT Cardio: Reg rate Chest: normal effort, normal rate of breathing Abd: soft, non-distended Ext: no edema Psych: pleasant, normal affect Neuro: Makes eye contact with examiner.   Follows commands.   Name and age.   He does have some mild delay in processing. Dysarthria, unchanged Motor: RUE/RLE: 4/5 proximal distal LUE: 4-/5 proximal distal LLE: 1+/5 proximal distal, stable  Assessment/Plan: 1. Functional deficits which require 3+ hours per day of interdisciplinary therapy in a comprehensive inpatient rehab setting. Physiatrist is providing close team supervision and 24 hour management of active medical problems listed below. Physiatrist and rehab team continue to assess barriers to discharge/monitor patient progress toward functional and medical goals   Care  Tool:  Bathing    Body parts bathed by patient: Right arm, Left arm, Chest, Abdomen, Front perineal area, Right upper leg, Left upper leg, Face   Body parts bathed by helper: Buttocks, Right lower leg, Left lower leg     Bathing assist Assist Level: Moderate Assistance - Patient 50 - 74%     Upper Body Dressing/Undressing Upper body dressing   What is the patient wearing?: Pull over shirt    Upper body assist Assist Level: Moderate Assistance - Patient 50 - 74%    Lower Body Dressing/Undressing Lower body dressing      What is the patient wearing?: Incontinence brief     Lower body assist Assist for lower body dressing: 2 Helpers     Toileting Toileting    Toileting assist Assist for toileting: 2 Helpers     Transfers Chair/bed transfer  Transfers assist     Chair/bed transfer assist level: 2 Helpers     Locomotion Ambulation   Ambulation assist   Ambulation activity did not occur: Safety/medical concerns          Walk 10 feet activity   Assist  Walk 10 feet activity did not occur: Safety/medical concerns        Walk 50 feet activity   Assist Walk 50 feet with 2 turns activity did not occur: Safety/medical concerns         Walk 150 feet activity   Assist  Walk 150 feet activity did not occur: Safety/medical concerns         Walk 10 feet on uneven surface  activity   Assist Walk 10 feet on uneven surfaces activity did not occur: Safety/medical concerns         Wheelchair     Assist Is the patient using a wheelchair?: Yes Type of Wheelchair: Manual    Wheelchair assist level: Supervision/Verbal cueing Max wheelchair distance: 28ft    Wheelchair 50 feet with 2 turns activity    Assist        Assist Level: Dependent - Patient 0%   Wheelchair 150 feet activity     Assist      Assist Level: Dependent - Patient 0%    Medical Problem List and Plan: 1.  Left-sided hemiparesis secondary to infarct  right posterior lentiform nucleus, thalamus, and tail of the caudate with additional punctate infarct in the lateral left thalamus.   Continue CIR -Interdisciplinary Team Conference today   2.  Antithrombotics: -DVT/anticoagulation:  Pharmaceutical: Heparin             -antiplatelet therapy: Aspirin 81 mg daily and Plavix 75 mg daily until 10/30, then Plavix alone. 3. Pain Management: Continue Lyrica 50 mg 3 times daily, hydrocodone as needed             Monitor with increased exertion  Appears controlled on 10/18 4. Mood: Klonopin 0.25 mg 3 times daily, Prozac 20 mg daily             -antipsychotic agents: N/A 5. Neuropsych: This patient is capable of making decisions on his own behalf. 6. Skin/Wound Care: Routine skin checks 7. Fluids/Electrolytes/Nutrition: Routine in and outs             CMP ordered for tomorrow 8.  Hypertension.  Norvasc 5 mg daily, Toprol-XL 25 mg daily.    Blood pressure is labile, but elevated, as needed hydralazine added on 10/16  Increase Norvasc to 10mg .   Increase Toprol to 50mg             Monitor with increased mobility 9.  Hyperlipidemia.  Zocor/Zetia 10.  COPD with history of tobacco use.  Continue nebulizers as directed as well as Singulair.  Check oxygen saturations every shift 11.  Diabetes mellitus with hyperglycemia.  Hemoglobin A1c 6.7.  Amaryl 0.5 mg twice daily  Elevated on 10/16, will consider medication adjustments if persistent  Provide dietary education             Monitor with increased mobility 12.  Right side parotid mass.  Follow-up outpatient ENT 13.  CKD stage III.  Baseline creatinine 1.1-1.3.               Cr up to 1.41- place nursing order to encourage 6-8 glasses of water per day and creatinine has normalized to 1.10, monitor weekly.  14.  GERD.  Protonix 15. Right breat toe bunion: requested ortho tech eval for orthotic. Discussed benefits of icing.    LOS: 4 days A FACE TO FACE EVALUATION WAS PERFORMED  Barry [name] P  Barry [name] 08/04/2021, 11:06 AM

## 2021-08-05 LAB — GLUCOSE, CAPILLARY
Glucose-Capillary: 142 mg/dL — ABNORMAL HIGH (ref 70–99)
Glucose-Capillary: 154 mg/dL — ABNORMAL HIGH (ref 70–99)
Glucose-Capillary: 137 mg/dL — ABNORMAL HIGH (ref 70–99)
Glucose-Capillary: 144 mg/dL — ABNORMAL HIGH (ref 70–99)

## 2021-08-05 MED ORDER — HYDRALAZINE HCL 10 MG PO TABS
10.0000 mg | ORAL_TABLET | Freq: Three times a day (TID) | ORAL | Status: DC
  Administered 2021-08-05 – 2021-08-06 (×3): 10 mg via ORAL
  Filled 2021-08-05 (×2): qty 1

## 2021-08-05 MED ORDER — HYDRALAZINE HCL 10 MG PO TABS
10.0000 mg | ORAL_TABLET | Freq: Three times a day (TID) | ORAL | Status: DC
  Filled 2021-08-05 (×2): qty 1

## 2021-08-05 NOTE — Progress Notes (Signed)
[place] PHYSICAL MEDICINE & REHABILITATION PROGRESS NOTE  Subjective/Complaints: On commode Left sided inattention Bunion not bothering him No other complaints  ROS: Denies CP, SOB, N/V/D, +right great toe pain- resolved on its own  Objective: Vital Signs: Blood pressure (!) 149/79, pulse 63, temperature 98.6 F (37 C), temperature source Oral, resp. rate 14, height 5\' 11"  (1.803 m), weight 74.2 kg, SpO2 95 %. No results found. Recent Labs    08/03/21 0543  WBC 9.5  HGB 13.5  HCT 40.1  PLT 231   Recent Labs    08/03/21 0543 08/04/21 0520  NA 135 135  K 4.3 4.1  CL 104 103  CO2 25 26  GLUCOSE 195* 157*  BUN 30* 24*  CREATININE 1.42* 1.10  CALCIUM 8.5* 8.5*    Intake/Output Summary (Last 24 hours) at 08/05/2021 1044 Last data filed at 08/05/2021 0830 Gross per 24 hour  Intake 840 ml  Output 1750 ml  Net -910 ml        Physical Exam: BP (!) 149/79 (BP Location: Right Arm)   Pulse 63   Temp 98.6 F (37 C) (Oral)   Resp 14   Ht 5\' 11"  (1.803 m)   Wt 74.2 kg   SpO2 95%   BMI 22.81 kg/m  Gen: no distress, normal appearing HEENT: oral mucosa pink and moist, NCAT Cardio: Reg rate Chest: normal effort, normal rate of breathing Abd: soft, non-distended Ext: no edema Psych: pleasant, normal affect Skin: intact Neuro: Makes eye contact with examiner.   Follows commands.   Name and age.   He does have some mild delay in processing. Difficulty with medication management.  Dysarthria, unchanged Motor: RUE/RLE: 4/5 proximal distal LUE: 4-/5 proximal distal LLE: 1+/5 proximal distal, stable  Assessment/Plan: 1. Functional deficits which require 3+ hours per day of interdisciplinary therapy in a comprehensive inpatient rehab setting. Physiatrist is providing close team supervision and 24 hour management of active medical problems listed below. Physiatrist and rehab team continue to assess barriers to discharge/monitor patient progress toward functional  and medical goals   Care Tool:  Bathing    Body parts bathed by patient: Right arm, Left arm, Chest, Abdomen, Front perineal area, Right upper leg, Left upper leg, Face   Body parts bathed by helper: Buttocks, Right lower leg, Left lower leg     Bathing assist Assist Level: Moderate Assistance - Patient 50 - 74%     Upper Body Dressing/Undressing Upper body dressing   What is the patient wearing?: Pull over shirt    Upper body assist Assist Level: Moderate Assistance - Patient 50 - 74%    Lower Body Dressing/Undressing Lower body dressing      What is the patient wearing?: Incontinence brief     Lower body assist Assist for lower body dressing: 2 Helpers     Toileting Toileting    Toileting assist Assist for toileting: 2 Helpers     Transfers Chair/bed transfer  Transfers assist     Chair/bed transfer assist level: 2 Helpers     Locomotion Ambulation   Ambulation assist   Ambulation activity did not occur: Safety/medical concerns  Assist level: 2 helpers Assistive device: Parallel bars Max distance: 70ft   Walk 10 feet activity   Assist  Walk 10 feet activity did not occur: Safety/medical concerns        Walk 50 feet activity   Assist Walk 50 feet with 2 turns activity did not occur: Safety/medical concerns  Walk 150 feet activity   Assist Walk 150 feet activity did not occur: Safety/medical concerns         Walk 10 feet on uneven surface  activity   Assist Walk 10 feet on uneven surfaces activity did not occur: Safety/medical concerns         Wheelchair     Assist Is the patient using a wheelchair?: Yes Type of Wheelchair: Manual    Wheelchair assist level: Supervision/Verbal cueing Max wheelchair distance: 74ft    Wheelchair 50 feet with 2 turns activity    Assist        Assist Level: Dependent - Patient 0%   Wheelchair 150 feet activity     Assist      Assist Level: Dependent -  Patient 0%    Medical Problem List and Plan: 1.  Left-sided hemiparesis secondary to infarct right posterior lentiform nucleus, thalamus, and tail of the caudate with additional punctate infarct in the lateral left thalamus.   Continue CIR -Interdisciplinary Team Conference today   ELOS 25 days; will discuss goals and progress with patient 2.  Antithrombotics: -DVT/anticoagulation:  Pharmaceutical: Heparin             -antiplatelet therapy: Aspirin 81 mg daily and Plavix 75 mg daily until 10/30, then Plavix alone. 3. Pain Management: Continue Lyrica 50 mg 3 times daily, hydrocodone as needed             Monitor with increased exertion  Appears controlled on 10/19 4. Mood: Klonopin 0.25 mg 3 times daily, Prozac 20 mg daily             -antipsychotic agents: N/A 5. Neuropsych: This patient is capable of making decisions on his own behalf. 6. Skin/Wound Care: Routine skin checks 7. Fluids/Electrolytes/Nutrition: Routine in and outs             CMP ordered for tomorrow 8.  Hypertension.  Norvasc 5 mg daily, Toprol-XL 25 mg daily.    Blood pressure is labile, but elevated, as needed hydralazine added on 10/16  Increase Norvasc to 10mg .   Increase Toprol to 50mg             Monitor with increased mobility 9.  Hyperlipidemia.  Zocor/Zetia 10.  COPD with history of tobacco use.  Continue nebulizers as directed as well as Singulair.  Check oxygen saturations every shift 11.  Diabetes mellitus with hyperglycemia.  Hemoglobin A1c 6.7.  Continue Amaryl 0.5 mg twice daily  Elevated on 10/16, will consider medication adjustments if persistent  Provide dietary education             Monitor with increased mobility 12.  Right side parotid mass.  Follow-up outpatient ENT 13.  CKD stage III.  Baseline creatinine 1.1-1.3.               Cr up to 1.41- place nursing order to encourage 6-8 glasses of water per day and creatinine has normalized to 1.10, monitor weekly.  14.  GERD.  Continue Protonix 15.  Right breat toe bunion: requested ortho tech eval for orthotic. Discussed benefits of icing.    LOS: 5 days A FACE TO FACE EVALUATION WAS PERFORMED  Barry [name] 08/05/2021, 10:44 AM

## 2021-08-05 NOTE — Progress Notes (Signed)
Patient ID: Barry [name], male   DOB: 05/31/1933, 87 y.o.   MRN: [id number] Met with pt and spoke with son via telephone to discuss team conference goals of min assist level and target discharge date of 11/8. Pt was hopeful he could get to mod/I and go home alone but understands the therapy recommendations. Discussed with son hiring caregivers while he and his wife work. Have left private duty list in pt's room for son to begin looking into this since it takes time. Aware closer to discharge will ask him to come in and go through therapies with Dad, he is agreeable to this. Will continue to work on discharge needs. Pt hopes he does better than the team thinks he will.

## 2021-08-05 NOTE — Progress Notes (Signed)
Occupational Therapy Session Note  Patient Details  Name: Barry [name] MRN: [id number] Date of Birth: December 30, 1932  Today's Date: 08/05/2021 OT Individual Time: 8144-8185 OT Individual Time Calculation (min): 55 min    Short Term Goals: Week 1:  OT Short Term Goal 1 (Week 1): Pt will maintain static sitting balance EOB with no more than intermittent min A in prep for seated ADL. OT Short Term Goal 2 (Week 1): Pt will completed sit to stand with max of 1 in prep for standing ADL. OT Short Term Goal 3 (Week 1): Pt will don shirt with min A. OT Short Term Goal 4 (Week 1): Pt will don pants with max A. OT Short Term Goal 5 (Week 1): Pt will complete toilet transfer with max and LRAD.  Skilled Therapeutic Interventions/Progress Updates:    Pt semi reclined in bed, no c/o pain, he was reporting however that yesterday he sat up almost all day and his bottom felt sore near the end.  Requesting to use toilet.  Left roll and sidelying to sit with min assist and step by step cues reviewed for body mechanics.  Pt required min assist intermittently to correct left/posterior lean secondary to inattention.  Squat pivot with max assist to w/c, transported to bathroom and squat pivot to drop arm bedside commode over toilet with max assist as well. Max assist sit<>stand and Probation officer. Pt had continent episode of bowel(charted in flowsheet).  Pericare completed in standing using stedy due to no +2 assist available.  Pt required mod assist to maintain upright standing posture at stedy and he was able to perform pericare using RUE.  Significant left lean in standing with and posterior bias and more so when attention divided.  Donned pants with max assist also sit<>stand at stedy with similar weight shifting /leaning noted.  Once in sitting pt noted with significant posterior pelvic tilt.  Provided visual demonstration and tactile cues to improve pelvic positioning and upright posture.  Squat pivot to w/c  with mod assist. Total assist to donn TED hose due to increased swelling of left foot noted.  Pt donned right sock and shoe however he attempted but unable to tie shoe due to incoordination of left hand.  Total assist to tie both shoes.  Direct hand off to SPT.  Therapy Documentation Precautions:  Precautions Precautions: Fall Precaution Comments: L hemi, L inattention, HOH Restrictions Weight Bearing Restrictions: No   Therapy/Group: Individual Therapy  [name] 08/05/2021, 10:12 AM

## 2021-08-05 NOTE — Patient Care Conference (Signed)
Inpatient RehabilitationTeam Conference and Plan of Care Update Date: 08/05/2021   Time: 11:08 AM    Patient Name: Barry [name] [facility]      Medical Record Number: [id number]  Date of Birth: 01-29-1933 Sex: Male         Room/Bed: 4M01C/4M01C-01 Payor Info: Payor: MEDICARE / Plan: MEDICARE PART A AND B / Product Type: *No Product type* /    Admit Date/Time:  07/31/2021 12:34 PM  Primary Diagnosis:  Right thalamic infarction [facility])  Hospital Problems: Principal Problem:   Right thalamic infarction [facility])    Expected Discharge Date: Expected Discharge Date: 08/25/21  Team Members Present: Physician leading conference: Dr. Leeroy Cha Social Worker Present: Ovidio Kin, LCSW Nurse Present: Other (comment) Glenis Smoker RN) PT Present: Ginnie Smart, PT OT Present: Leretha Pol, OT SLP Present: Lillie Columbia, SLP PPS Coordinator present : Gunnar Fusi, SLP     Current Status/Progress Goal Weekly Team Focus  Bowel/Bladder   pt continent to bowel and bladder  pt to remain continent to bowel and bladder      Swallow/Nutrition/ Hydration             ADL's   mod-max assist functional transfers, total assist LB self care, sup-min assist UB self care; left inattention; ataxia LUE; left/posterior lean and significant posterior pelvic tilt  minA  functional transfers, self care training, postural control, neuro re-ed   Mobility   max to totalA bed mobility, totalA for squat<>pivot transfers (pt 25%), totalA for sit<>stand transfers. L inattention, L lean in sitting, LLE grossly 3-/5. L toe pain!  minA overall  LLE NMR, functional transfers, sitting balance, standing and pre-gait training as able.   Communication             Safety/Cognition/ Behavioral Observations  mod impairment  Supervision  med management, money management, error awareness, functional recall of novel info   Pain   denies pain  denies pain      Skin   skin intact  skin intact  skin to remain intact      Discharge Planning:  Son plans for pt to come to his home and hire assist while he and his wfie work. Pt would like to go home eventually   Team Discussion: Right Thalamic Infarct ; CKD improved with increase fluids; persistent weakness Progress impaired by left lean, poor posture control and left inattention. Patient on target to meet rehab goals: Yes, currently able to ambulate parallel bars with mod assist; goals for discharge set at min assist;   *See Care Plan and progress notes for long and short-term goals.   Revisions to Treatment Plan:  Gait training; working on high level cognitive task  Teaching Needs: Safety; medications, secondary risk management, transfers, toileting, etc.  Current Barriers to Discharge: Home enviroment access/layout and Lack of/limited family support  Possible Resolutions to Barriers: Family education with son     Medical Summary Current Status: bunion, hypertension, denies pain, AKI on CKD, left posterior lean and pelvic tilt, left thalamic infarction  Barriers to Discharge: Medical stability  Barriers to Discharge Comments: bunion, hypertension, AKI on CKD, left posterior lean and pelvic tilt, left thalamic infarction Possible Resolutions to Barriers/Weekly Focus: discussed orthotic of bunion is painful, encouraged fluids and monitor creatinine, postural education, B complex and C started for stroke recovery   Continued Need for Acute Rehabilitation Level of Care: The patient requires daily medical management by a physician with specialized training in physical medicine and rehabilitation for the following reasons: Direction  of a multidisciplinary physical rehabilitation program to maximize functional independence : Yes Medical management of patient stability for increased activity during participation in an intensive rehabilitation regime.: Yes Analysis of laboratory values and/or radiology reports with any subsequent need for medication  adjustment and/or medical intervention. : Yes   I attest that I was present, lead the team conference, and concur with the assessment and plan of the team.   Dorien Chihuahua B 08/05/2021, 1:47 PM

## 2021-08-05 NOTE — Progress Notes (Signed)
Physical Therapy Session Note  Patient Details  Name: Barry [name] MRN: [id number] Date of Birth: 07/29/1933  Today's Date: 08/05/2021 PT Individual Time: 1696-7893 PT Individual Time Calculation (min): 58 min   Short Term Goals: Week 1:  PT Short Term Goal 1 (Week 1): Pt will complete bed mobility with maxA PT Short Term Goal 2 (Week 1): Pt will complete bed<>chair transfers with maxA and LRAD PT Short Term Goal 3 (Week 1): Pt will complete sit<>stand wtih maxA and LRAD PT Short Term Goal 4 (Week 1): Pt will demonstrate improved awareness of L side during functional mobility tasks  Skilled Therapeutic Interventions/Progress Updates:    Pt received sitting in w/c and agreeable to therapy session despite reporting some fatigue and some low back discomfort. Transported to/from gym in w/c for time management and energy conservation. Sit>stand w/c>B UE support on litegait with light mod assist of 1 for balance and guarding/blocking L knee - no L lean noticed at this time and no true knee buckling. Donned litegait harness with +2 assist.  Gait training ~119ft overground with litegait partial BWS and using B UE support - initial heavy mod assist of 1 for L LE management and facilitating R/L weight shift with +2 for litegait management; however, improved throughout - pt able to advance L LE during swing with initial intermittent min assist due to excessive adduction but with visual target and verbal cuing pt able to progress to self-correction without manual facilitation - requires heavy mod assist initially to manually facilitate L hip/knee extension during stance as pt hanging down in harness, noticed he was doing this bilaterally so once cuing for improvement on both sides overall maintained upright better, but continues to require min A to facilitate increased L knee extension - throughout gait training pt responds very well to verbal, tactile, and visual cuing to improve gait mechanics.   Assessed vitals: BP 199/87 (MAP 116), HR 57bpm  Reassessed after 5 minutes during seated rest break: BP 185/76 (MAP 108), HR 53bpm  Reassessed again after another 5 minutes (unfastened harness to avoid that contributing to elevated BP): BP 185/75 (MAP 106), HR 54bpm, SpO2 100% Pt reports "If I think about it I can make my BP go up."   Gait training ~65ft using litegait as described before and providing slightly less partial BWS and pt demonstrating great carryover of gait training with improved advancement and positioning of L LE during swing, improved R/L weight shifting onto stance limb, and improved L hip/knee extension during stance phase although all still demonstrating overall significant impairment. Reassessed vitals: BP 196/78 (MAP 110), HR 54bpm   Doffed harness and transported back to room. Reassessed vitals: BP 185/74 (MAP 107), HR 57bpm Pt left seated in w/c with needs in reach and seat belt alarm on.   RN and MD made aware of pt's elevated BP during gait training.   Therapy Documentation Precautions:  Precautions Precautions: Fall Precaution Comments: L hemi, L inattention, HOH Restrictions Weight Bearing Restrictions: No   Pain: At beginning of session, reported some low back discomfort, unrated, but otherwise no reports of pain during session and no intervention needed.   Therapy/Group: Individual Therapy  Tawana Scale , PT, DPT, NCS, CSRS 08/05/2021, 12:28 PM

## 2021-08-05 NOTE — Progress Notes (Signed)
Speech Language Pathology Daily Session Note  Patient Details  Name: COLAN LAYMON MRN: [id number] Date of Birth: [birth year]  Today's Date: 08/05/2021 SLP Individual Time: 1009-1102 SLP Individual Time Calculation (min): 53 min  Short Term Goals: Week 1: SLP Short Term Goal 1 (Week 1): Patient will demonstrate functional problem solving for basic and familiar tasks with Min A verbal cues. SLP Short Term Goal 2 (Week 1): Patient will utilize external memory aids to recall functional information with Mod A multimodal cues. SLP Short Term Goal 3 (Week 1): Patient will self-monitor and correct errors during functional tasks with Mod A verbal cues. SLP Short Term Goal 4 (Week 1): Patient will attend to left visual field/body during functional tasks with Min verbal cues.  Skilled Therapeutic Interventions: Pt seen for skilled ST with focus on cognitive goals, pt received upright in wheelchair following OT session. Pt brought down to Speech room for further practice and education on medication management with TID pillbox. SLP facilitating task by providing min-mod A verbal cues for accuracy. Pt independent double checking for errors after initial organization, however unable to ID errors 50% of time. Pt frequently dropping medication on floor during task d/t to L handed weakness and R thumb pain, unable to reach independently. Recommend supervision/follow-up from son with medication management/pillbox organization at discharge. Pt brought back to room, alarm belt activated and all needs placed within reach. Cont ST POC.   Pain Pain Assessment Pain Scale: 0-10 Pain Score: 0-No pain  Therapy/Group: Individual Therapy  [name] 08/05/2021, 10:53 AM

## 2021-08-05 NOTE — Progress Notes (Signed)
Physical Therapy Session Note  Patient Details  Name: Barry [name] MRN: [id number] Date of Birth: 1933-07-07  Today's Date: 08/05/2021 PT Individual Time: 1300-1330 + 1440-1525 PT Individual Time Calculation (min): 30 min + 45 min  Short Term Goals: Week 1:  PT Short Term Goal 1 (Week 1): Pt will complete bed mobility with maxA PT Short Term Goal 2 (Week 1): Pt will complete bed<>chair transfers with maxA and LRAD PT Short Term Goal 3 (Week 1): Pt will complete sit<>stand wtih maxA and LRAD PT Short Term Goal 4 (Week 1): Pt will demonstrate improved awareness of L side during functional mobility tasks  Skilled Therapeutic Interventions/Progress Updates:     1st session: Pt seated in w/c to start session - agreeable to therapy. He does not indicate pain and is motivated to participate. Transported in w/c to main rehab gym. Retrieved a RW orthotic for his LUE to improve grip and to work on standing, pre-gait, and gait with the RW. He requires maxA for sit<>stand from w/c to RW with L knee block - requires a heavy modA in standing for postural deficits of posterior and L lateral lean - unable to correct with verbal and visual cueing. Wheeled to // bars to allow improved facilitation from therapist. Sit<>stand in // bars with minA with L knee block. Focused on static standing for LLE weight bearing and for postural awareness. Encouraged awareness of self, limiting external feedback for corrections except when safety risk presented. Able to initiate gait in // bars, requiring maxA for trunk and LLE management. Pt returned to room at completion of session, remained seated in w/c, safety belt alarm on, all needs within reach. Pt voices disappointment in performance in therapy - provided encouragement and emotional support.   2nd session: Pt presents seated in w/c, agreeable to therapy session. Reports 8/10 back pain but denies request for pain medications. Provided moist heat pack, rest breaks,  mobility, and exercises for pain management. Transported to main rehab gym for time and energy conservation. Assisted to mat table via squat<>pivot transfer, completed at [facility] level. While sitting unsupported at edge of mat, he requires minA for static sitting balance due to L lean. Provided large wedge and pillows and assist pt in supine position, requiring maxA for trunk and BLE management. Heat pack applied in hooklying position. Completed supine heel slides (AAROM) on LLE, bridges, and sidelying knee extension/flexion with use of powder board. 3x15 reps each. Assisted back to EOM with totalA (pt 25%) and skin checked on back after application of heat pack - some mild redness. Pt reports improvement in back pain, unrated. He questions his prognosis with stroke recovery, as well as asks when he can return to driving - educated him on PT goals, PT POC, NMR for stroke recovery, and recommended him speak to his MD regarding driving due to several reasons such as weakness and L inattention. Pt assisted back to his w/c via squat<>pivot transfer at [facility] level. Returned to his room and remained seated in w/c with safety belt alarm on and all needs in reach. Informed of upcoming therapy schedule.     Therapy Documentation Precautions:  Precautions Precautions: Fall Precaution Comments: L hemi, L inattention, HOH Restrictions Weight Bearing Restrictions: No General:    Therapy/Group: Individual Therapy  Suzanne Garbers P Omarri Eich 08/05/2021, 7:27 AM

## 2021-08-06 LAB — GLUCOSE, CAPILLARY
Glucose-Capillary: 153 mg/dL — ABNORMAL HIGH (ref 70–99)
Glucose-Capillary: 126 mg/dL — ABNORMAL HIGH (ref 70–99)
Glucose-Capillary: 154 mg/dL — ABNORMAL HIGH (ref 70–99)
Glucose-Capillary: 90 mg/dL (ref 70–99)

## 2021-08-06 MED ORDER — HYDRALAZINE HCL 25 MG PO TABS
25.0000 mg | ORAL_TABLET | Freq: Three times a day (TID) | ORAL | Status: DC
  Administered 2021-08-06 – 2021-08-07 (×2): 25 mg via ORAL
  Filled 2021-08-06 (×2): qty 1

## 2021-08-06 NOTE — Progress Notes (Signed)
Barry [name] PHYSICAL MEDICINE & REHABILITATION PROGRESS NOTE  Subjective/Complaints: Discussed increasing his blood pressure medicine, foods that can help his BP He also asks about foods to eat for his diabetes. Given that HR is soft and BP high- will increase hydralazine  ROS: Denies CP, SOB, N/V/D, +right great toe pain- resolved on its own  Objective: Vital Signs: Blood pressure (!) 160/70, pulse (!) 57, temperature 97.9 F (36.6 C), temperature source Oral, resp. rate 16, height 5\' 11"  (1.803 m), weight 74.2 kg, SpO2 97 %. No results found. No results for input(s): WBC, HGB, HCT, PLT in the last 72 hours.  Recent Labs    08/04/21 0520  NA 135  K 4.1  CL 103  CO2 26  GLUCOSE 157*  BUN 24*  CREATININE 1.10  CALCIUM 8.5*    Intake/Output Summary (Last 24 hours) at 08/06/2021 1608 Last data filed at 08/06/2021 1606 Gross per 24 hour  Intake 740 ml  Output 2150 ml  Net -1410 ml        Physical Exam: BP (!) 160/70   Pulse (!) 57   Temp 97.9 F (36.6 C) (Oral)   Resp 16   Ht 5\' 11"  (1.803 m)   Wt 74.2 kg   SpO2 97%   BMI 22.81 kg/m  Gen: no distress, normal appearing HEENT: oral mucosa pink and moist, NCAT Cardio: Reg rate Chest: normal effort, normal rate of breathing Abd: soft, non-distended Ext: no edema Psych: pleasant, normal affect Skin: intact Neuro: Makes eye contact with examiner.   Follows commands.   Name and age.   He does have some mild delay in processing. Difficulty with medication management.  Dysarthria, unchanged Motor: RUE/RLE: 4/5 proximal distal LUE: 4-/5 proximal distal LLE: 1+/5 proximal distal, stable  Assessment/Plan: 1. Functional deficits which require 3+ hours per day of interdisciplinary therapy in a comprehensive inpatient rehab setting. Physiatrist is providing close team supervision and 24 hour management of active medical problems listed below. Physiatrist and rehab team continue to assess barriers to  discharge/monitor patient progress toward functional and medical goals   Care Tool:  Bathing    Body parts bathed by patient: Right arm, Left arm, Chest, Abdomen, Front perineal area, Right upper leg, Left upper leg, Face   Body parts bathed by helper: Buttocks, Right lower leg, Left lower leg     Bathing assist Assist Level: Moderate Assistance - Patient 50 - 74%     Upper Body Dressing/Undressing Upper body dressing   What is the patient wearing?: Pull over shirt    Upper body assist Assist Level: Moderate Assistance - Patient 50 - 74%    Lower Body Dressing/Undressing Lower body dressing      What is the patient wearing?: Incontinence brief     Lower body assist Assist for lower body dressing: 2 Helpers     Toileting Toileting    Toileting assist Assist for toileting: 2 Helpers     Transfers Chair/bed transfer  Transfers assist     Chair/bed transfer assist level: 2 Helpers     Locomotion Ambulation   Ambulation assist   Ambulation activity did not occur: Safety/medical concerns  Assist level: 2 helpers (mod A of 1, partial BWS & +2 for litegait management) Assistive device: Lite Gait Max distance: 183ft   Walk 10 feet activity   Assist  Walk 10 feet activity did not occur: Safety/medical concerns        Walk 50 feet activity   Assist Walk 50 feet with  2 turns activity did not occur: Safety/medical concerns         Walk 150 feet activity   Assist Walk 150 feet activity did not occur: Safety/medical concerns         Walk 10 feet on uneven surface  activity   Assist Walk 10 feet on uneven surfaces activity did not occur: Safety/medical concerns         Wheelchair     Assist Is the patient using a wheelchair?: Yes Type of Wheelchair: Manual    Wheelchair assist level: Supervision/Verbal cueing Max wheelchair distance: 46ft    Wheelchair 50 feet with 2 turns activity    Assist        Assist Level:  Dependent - Patient 0%   Wheelchair 150 feet activity     Assist      Assist Level: Dependent - Patient 0%    Medical Problem List and Plan: 1.  Left-sided hemiparesis secondary to infarct right posterior lentiform nucleus, thalamus, and tail of the caudate with additional punctate infarct in the lateral left thalamus.   Continue CIR ELOS 25 days; will discuss goals and progress with patient 2.  Antithrombotics: -DVT/anticoagulation:  Pharmaceutical: Continue Heparin             -antiplatelet therapy: Aspirin 81 mg daily and Plavix 75 mg daily until 10/30, then Plavix alone. 3. Pain Management: Continue Lyrica 50 mg 3 times daily, hydrocodone as needed             Monitor with increased exertion  Appears controlled on 10/19 4. Mood: Klonopin 0.25 mg 3 times daily, Prozac 20 mg daily             -antipsychotic agents: N/A 5. Neuropsych: This patient is capable of making decisions on his own behalf. 6. Skin/Wound Care: Routine skin checks 7. Fluids/Electrolytes/Nutrition: Routine in and outs             CMP ordered for tomorrow 8.  Hypertension.  Norvasc 5 mg daily, Toprol-XL 25 mg daily.    Blood pressure is labile, but elevated, as needed hydralazine added on 10/16  Increase Norvasc to 10mg .   Increase Toprol to 50mg   Increase hydralazine to 25mg  TID            Monitor with increased mobility 9.  Hyperlipidemia.  Zocor/Zetia 10.  COPD with history of tobacco use.  Continue nebulizers as directed as well as Singulair.  Check oxygen saturations every shift 11.  Diabetes mellitus with hyperglycemia.  Hemoglobin A1c 6.7.  Continue Amaryl 0.5 mg twice daily  Elevated on 10/16, will consider medication adjustments if persistent  Provide dietary education             Monitor with increased mobility 12.  Right side parotid mass.  Follow-up outpatient ENT 13.  CKD stage III.  Baseline creatinine 1.1-1.3.               Cr up to 1.41- place nursing order to encourage 6-8 glasses of  water per day and creatinine has normalized to 1.10, monitor weekly.  14.  GERD.  Continue Protonix 15. Right breat toe bunion: requested ortho tech eval for orthotic. Discussed benefits of icing.    LOS: 6 days A FACE TO FACE EVALUATION WAS PERFORMED  Barry [name] 08/06/2021, 4:08 PM

## 2021-08-06 NOTE — Progress Notes (Signed)
Occupational Therapy Session Note  Patient Details  Name: Barry [name] MRN: [id number] Date of Birth: 06-Sep-1933  Today's Date: 08/06/2021 OT Individual Time: 0900-1000 OT Individual Time Calculation (min): 60 min    Short Term Goals: Week 1:  OT Short Term Goal 1 (Week 1): Pt will maintain static sitting balance EOB with no more than intermittent min A in prep for seated ADL. OT Short Term Goal 2 (Week 1): Pt will completed sit to stand with max of 1 in prep for standing ADL. OT Short Term Goal 3 (Week 1): Pt will don shirt with min A. OT Short Term Goal 4 (Week 1): Pt will don pants with max A. OT Short Term Goal 5 (Week 1): Pt will complete toilet transfer with max and LRAD.  Skilled Therapeutic Interventions/Progress Updates:    Pt semi reclined in bed, no c/o pain.  Pt perseverating on how weak he is and wishing he could do things the way he used to throughout session, needing frequent redirecting and encouragement to stay on task and improve frustration tolerance.  Pt completed left roll and sidelying to sit with CGA, increased time, and mod step by step cues.Sit to stand at [facility] with mod assist.  Pt took 4 steps in place with mod to max assist to maintain balance and achieve midline due to significant lateral trunk flexion to left with hips pushing to right. Provided visual demonstration of stand pivot sequencing and RW mgt prior to attempt. Stand pivot with max assist, max multimodal cues and second person guarding for safety.  Pt having difficulty attending to left side of body and motor planning sequence of steps for stand pivot.    Pt transported to bathroom and completed squat pivot w/c to shower bench with mod assist.  Doffed shirt with supervision. Doffed pants, underwear, and socks with mod assist, needing max cues for safety awareness to avoid bumping head on grab bars and to avoid excessive forward lean, instead implement figure 4 technique.  Pt Bathed UB and LB with CGA  including washing buttocks using lateral leans.  Pt donned shirt with supervision.  Squat pivot to w/c with mod assist.    Transported to sink where pt donned underwear and pants with mod assist at sit to stand level however needing max cues to correct standing posture and utilize mirror for visual feedback assist.  Pt attempted preparing for oral care, OT noted pt trying to put barrier cream on toothbrush and needing Vcs for safety awareness.  After safe setup provided, pt able to brush teeth seated at sink with distant supervision.  Call bell in reach, seat alarm on at end of session.  Pt exhibited good progress today, able to upgrade functional transfer from squat pivot to stand pivot using RW. Need further training in motor planning and left attention skills.    Therapy Documentation Precautions:  Precautions Precautions: Fall Precaution Comments: L hemi, L inattention, HOH Restrictions Weight Bearing Restrictions: No    Therapy/Group: Individual Therapy  [name] 08/06/2021, 11:57 AM

## 2021-08-06 NOTE — Progress Notes (Signed)
Occupational Therapy Session Note  Patient Details  Name: GREGORI ABRIL MRN: [id number] Date of Birth: [birth year]  Today's Date: 08/06/2021 OT Individual Time: 1132-1202 OT Individual Time Calculation (min): 30 min    Short Term Goals: Week 1:  OT Short Term Goal 1 (Week 1): Pt will maintain static sitting balance EOB with no more than intermittent min A in prep for seated ADL. OT Short Term Goal 2 (Week 1): Pt will completed sit to stand with max of 1 in prep for standing ADL. OT Short Term Goal 3 (Week 1): Pt will don shirt with min A. OT Short Term Goal 4 (Week 1): Pt will don pants with max A. OT Short Term Goal 5 (Week 1): Pt will complete toilet transfer with max and LRAD.  Skilled Therapeutic Interventions/Progress Updates:    Pt in wheelchair to start with report of slight back pain.  Agreeable to work on standing to help with back relief as well as to improve standing balance.  Mod assist for sit to stand with increased trunk flexion and bilateral knee flexion.  Slight ataxia noted as well.  Max facilitation in the trunk, left knee, and pelvis to maintain standing.  Increased posterior bias with occasional LOB noted.  Worked on pre-gait to step forward with the RUE and back.  He was able to complete with max assist but increased flexion throughout was noted.  Provided RW for support with standing on the third attempt but still exhibited increased flexion and slight ataxia with advancement of the RLE.  Finished session with pt in the wheelchair and with the call button and phone in reach with safety belt in place.  Discussed getting back to bed with assist once he finished eating to help with back pain relief.  Will also discuss wheelchair or cushion change to help with sitting toleration.    Therapy Documentation Precautions:  Precautions Precautions: Fall Precaution Comments: L hemi, L inattention, HOH Restrictions Weight Bearing Restrictions: No  Pain: Pain  Assessment Pain Scale: Faces Faces Pain Scale: Hurts a little bit Pain Type: Acute pain Pain Location: Back Pain Orientation: Lower Pain Descriptors / Indicators: Discomfort Pain Onset: With Activity Pain Intervention(s): Repositioned ADL: See Care Tool Section for some details of mobility and selfcare   Therapy/Group: Individual Therapy  Aneisa Karren OTR/L 08/06/2021, 12:47 PM

## 2021-08-06 NOTE — Progress Notes (Signed)
Physical Therapy Session Note  Patient Details  Name: Barry [name] MRN: [id number] Date of Birth: [birth year]  Today's Date: 08/06/2021 PT Individual Time: 1415-1525 PT Individual Time Calculation (min): 70 min   Short Term Goals: Week 1:  PT Short Term Goal 1 (Week 1): Pt will complete bed mobility with maxA PT Short Term Goal 2 (Week 1): Pt will complete bed<>chair transfers with maxA and LRAD PT Short Term Goal 3 (Week 1): Pt will complete sit<>stand wtih maxA and LRAD PT Short Term Goal 4 (Week 1): Pt will demonstrate improved awareness of L side during functional mobility tasks  Skilled Therapeutic Interventions/Progress Updates:     Pt supine in bed on arrival - agreeable to PT tx - reports 5/10 back pain - RN was made aware. Completed supine<>sit with modA with use of bed features, educated on log rolling technique to reduce back pain exacerbation. Donned tennis shoes with totalA and then completed squat<>pivot transfer with mod/maxA from EOB to w/c with cues needed for forward weight shift, sequencing, and hand placement.   Retrieved ROHO cushion for w/c to substitute for standard foam cushion as pt has been complaining of buttock discomfort while sitting. Pt also incontinent at times so incontinent cover provided.   Transported in w/c to day room rehab gym for time. Assisted to mat table in similar manner. Focused on sit<>stand transfers, standing balance, and RLE NMR in standing. Used mirror for visual aid. Completed sit<>stand with modA while using back of arm chair to support his stronger R side while using his L hand on therapist shoulder to support L side. Required mod assist for standing balance due to L lateral lean and L knee instability. Able to correct L knee buckling but significant difficulty in postural awareness and L lean. Next, worked on [employer] gait with use of LiteGait overground. Applied harness in sitting for safety with +2 assist  Gait training ~18f overground  with LiteGait with partial BWS and using BUE support - requires modA for facilitating LLE management, L/R weight shift, and trunk/hip extension. He's able to advance LLE during swing with minA with noted adduction resulting in poor foot placement. He has a significant crouched gait with a "cowboy" stance and has difficulty correcting to improve extension in knee's and hip. He also tends to sit in the harness and it was difficult to get out of this position.   Assisted onto the Nustep via squat<>pivot transfer with modA. Required assist for foot placement and used strap to secure LE's into Nustep paddles. Completed 8 minutes with workload set to 3, progressing to 4 at minute 3. Cues for keeping L knee in neutral alignment which he was able to maintain. Required occasional cueing for monitoring L grip to Nustep due to inattention.   Pt assisted back to his w/c via squat<>pivot transfer with difficulty clearing hips, likley due to fatigue from session. Noted R skin tear on elbow which RN was notified. Transported back to his room and then assisted back to bed via squat<>pivot transfer with modA +2. Sit>supine requiring maxA +2 and then raised in bed with totalA +2. Bed alarm on, all needs met.   Therapy Documentation Precautions:  Precautions Precautions: Fall Precaution Comments: L hemi, L inattention, HOH Restrictions Weight Bearing Restrictions: No General:    Therapy/Group: Individual Therapy  Ameilia Rattan P Marqueze Ramcharan 08/06/2021, 7:28 AM

## 2021-08-06 NOTE — Progress Notes (Signed)
Speech Language Pathology Daily Session Note  Patient Details  Name: Barry [name] MRN: [id number] Date of Birth: 1932-11-11  Today's Date: 08/06/2021 SLP Individual Time: 1030-1130 SLP Individual Time Calculation (min): 60 min  Short Term Goals: Week 1: SLP Short Term Goal 1 (Week 1): Patient will demonstrate functional problem solving for basic and familiar tasks with Min A verbal cues. SLP Short Term Goal 2 (Week 1): Patient will utilize external memory aids to recall functional information with Mod A multimodal cues. SLP Short Term Goal 3 (Week 1): Patient will self-monitor and correct errors during functional tasks with Mod A verbal cues. SLP Short Term Goal 4 (Week 1): Patient will attend to left visual field/body during functional tasks with Min verbal cues.  Skilled Therapeutic Interventions:   Patient seen for skilled ST session focusing on cognitive function goals. Patient in [facility] when SLP arrived in room and was receptive towards going to speech therapy room for session. Patient required consistent cues for attention and awareness to errors as he would frequently report "I don't have trouble with......." despite making errors during simulated tasks (medication management, reading instructions, completing basic level calendar writing task. He required modA verbal and visual cues to initiate and perform task as instructed. He would become tangential and require verbal cues to redirect to tasks/conversation. Patient did demonstrate awareness to left side deficits with his arm but required minA cues for left visual attention during functional reading task. Patient left in room with WC with safety alarm belt activated. He continues to benefit from skilled SLP intervention to maximize cognitive function prior to discharge.  Pain Pain Assessment Pain Scale: 0-10 Pain Score: 0-No pain  Therapy/Group: Individual Therapy  Sonia Baller, MA, CCC-SLP Speech Therapy

## 2021-08-07 LAB — GLUCOSE, CAPILLARY
Glucose-Capillary: 126 mg/dL — ABNORMAL HIGH (ref 70–99)
Glucose-Capillary: 122 mg/dL — ABNORMAL HIGH (ref 70–99)
Glucose-Capillary: 129 mg/dL — ABNORMAL HIGH (ref 70–99)
Glucose-Capillary: 170 mg/dL — ABNORMAL HIGH (ref 70–99)

## 2021-08-07 MED ORDER — HYDRALAZINE HCL 50 MG PO TABS
50.0000 mg | ORAL_TABLET | Freq: Three times a day (TID) | ORAL | Status: DC
  Administered 2021-08-07 – 2021-08-08 (×4): 50 mg via ORAL
  Filled 2021-08-07 (×4): qty 1

## 2021-08-07 MED ORDER — METOPROLOL SUCCINATE ER 25 MG PO TB24
25.0000 mg | ORAL_TABLET | Freq: Every day | ORAL | Status: DC
  Administered 2021-08-08 – 2021-08-09 (×2): 25 mg via ORAL
  Filled 2021-08-07 (×2): qty 1

## 2021-08-07 NOTE — Progress Notes (Signed)
Physical Therapy Session Note  Patient Details  Name: Barry [name] MRN: [id number] Date of Birth: September 17, 1933  Today's Date: 08/07/2021 PT Individual Time: 1030-1100 PT Individual Time Calculation (min): 30 min   Short Term Goals: Week 1:  PT Short Term Goal 1 (Week 1): Pt will complete bed mobility with maxA PT Short Term Goal 2 (Week 1): Pt will complete bed<>chair transfers with maxA and LRAD PT Short Term Goal 3 (Week 1): Pt will complete sit<>stand wtih maxA and LRAD PT Short Term Goal 4 (Week 1): Pt will demonstrate improved awareness of L side during functional mobility tasks   Skilled Therapeutic Interventions/Progress Updates:  Patient seated upright in w/c on entrance to room. Patient alert and agreeable to PT session.   Patient with no pain complaint throughout session.  Therapeutic Activity: Transfers: Patient performed sit<>stand to RW with ModA for forward lean and complete rise to stand. Stand pivot initiated w/c to mat table using RW with Mod/ MaxA and max vc/ tc for technique, effort, sequence of stepping. Requires heavy assist for maintaining upright stance, advancing LLE, maintaining RW placement. Lateral scoot transfer performed mat table --> w/c with MinA.  Neuromuscular Re-ed: NMR facilitated during session with focus on LLE muscle activation, standing balance, proprioception, midline orientation. Pt guided in blocked practice of sit<>stand with split UE positioning and focus on push from seat with RUE and LUE on RW as well as provided with NDT facilitation for upright posture and forward lean for appropriate lift from seat. Pt improves from full ModA to [place] for power up and MinA to complete with consistent multimodal cues to perform smoothly with complete push into upright posture. Pt also progressed to even LE positioning in order to increase LLE involvement in rise to stand. Then pt requires ModA to complete. Mirror use for visual proprioceptive feedback in  order for pt to self correct L lean. However, pt continues to require heaving tc/ vc to correct heavy lean to L side. NMR performed for improvements in motor control and coordination, balance, sequencing, judgement, and self confidence/ efficacy in performing all aspects of mobility at highest level of independence.   Patient seated upright in w/c at end of session with brakes locked, belt alarm set, and all needs within reach. Oriented to time and time of next therapy session.      Therapy Documentation Precautions:  Precautions Precautions: Fall Precaution Comments: L hemi, L inattention, HOH Restrictions Weight Bearing Restrictions: No  Pain: Pain Assessment Pain Scale: 0-10 Pain Score: 0-No pain  Therapy/Group: Individual Therapy  [name] PT, DPT 08/07/2021, 6:55 PM

## 2021-08-07 NOTE — Progress Notes (Signed)
Physical Therapy Session Note  Patient Details  Name: Barry [name] MRN: [id number] Date of Birth: 02-06-1933  Today's Date: 08/07/2021 PT Individual Time: 0923-3007 PT Individual Time Calculation (min): 55 min   Short Term Goals: Week 1:  PT Short Term Goal 1 (Week 1): Pt will complete bed mobility with maxA PT Short Term Goal 2 (Week 1): Pt will complete bed<>chair transfers with maxA and LRAD PT Short Term Goal 3 (Week 1): Pt will complete sit<>stand wtih maxA and LRAD PT Short Term Goal 4 (Week 1): Pt will demonstrate improved awareness of L side during functional mobility tasks  Skilled Therapeutic Interventions/Progress Updates:     Pt received sitting in w/c, agreeable to PT tx. Denies any pain and treatment completed to tolerance. He reported he had a headache - BP assessed reading 145/66. Transported patient in w/c to main rehab gym for time. Assisted to mat table via squat<>pivot transfer and modA. Initially he required minA for sitting balance but this progressing to SBA with cues and time to adjust to unsupported sitting. Worked on repeated sit<>stands from mat table with emphasis on LLE weightbearing and powering through LLE to assist in rising. He required modA at first with just using bilateral HHA however when introduced back of arm chair with BUE's, he was able to complete sit<>stand transfer with very light minA (!!). He completed several repetitions of these with verbal cues for controlled lowering to work on strengthening and verbal cues for hip/trunk extension to promote full upright. Pt was then instructed to complete sit<>stands with task overlay for removing horseshoes from elevated basketball rim, in efforts to promote trunk elongation - requiring minA for balance while keeping RUE on back of arm chair. We were able to progress to pre-gait training with static standing marching while keeping UE 's on back of arm chair - requiring min/modA for balance due to adduction of  LLE and difficulty keeping hip and trunk extended, beginning to "collapse" posturally. Due to BLE fatigue at this point of the session, introduced dynamic sitting task with seated ball toss to the basketball - working on LUE coordination, core strengthening.. pt enjoyed this activity. Pt assisted to w/c via modA squat<>pivot transfer and then returned to his room where he remained seated with safety belt alarm on, all immediate needs within reach.   Therapy Documentation Precautions:  Precautions Precautions: Fall Precaution Comments: L hemi, L inattention, HOH Restrictions Weight Bearing Restrictions: No General:      Therapy/Group: Individual Therapy  [name] 08/07/2021, 7:32 AM

## 2021-08-07 NOTE — Progress Notes (Signed)
Occupational Therapy Session Note  Patient Details  Name: Barry [name] MRN: [id number] Date of Birth: Jun 03, 1933  Today's Date: 08/07/2021 OT Individual Time: 0900-1000 OT Individual Time Calculation (min): 60 min    Short Term Goals: Week 1:  OT Short Term Goal 1 (Week 1): Pt will maintain static sitting balance EOB with no more than intermittent min A in prep for seated ADL. OT Short Term Goal 2 (Week 1): Pt will completed sit to stand with max of 1 in prep for standing ADL. OT Short Term Goal 3 (Week 1): Pt will don shirt with min A. OT Short Term Goal 4 (Week 1): Pt will don pants with max A. OT Short Term Goal 5 (Week 1): Pt will complete toilet transfer with max and LRAD.   Skilled Therapeutic Interventions/Progress Updates:    Pt semi reclined in bed, stating "Id like to have a bowel movement".  Pt reporting that he hasnt had a bowel movement since he has been here.  Gently reminded pt that he had a bowel movement during OT session a couple of days ago.  Supine to sit with CGA with min Vcs for hand placement.  Squat pivot to w/c with min assist.  Transported to bathroom and instructed in squat pivot, however pt initiated sit to stand.  Pt appearing steady and only needing min assist to maintain balance therefore continued with stand pivot to 3 in 1 commode.  Max assist for clothing management.  Pt had continent episode of stool (documented in flowsheet).  Pt required min-mod assist to maintain standing balance using RW and grab bar intermittently and also perform pericare.  Pt doffed shirt overhead and donned button down with min assist for buttoning shirt due to impaired fine motor control.  Pt doffed pants and underwear with mod assist secondary to incontinence of urine noted.  Donned clean brief and pants with mod assist at sit<>stand level.  Required increased time and min assist to donn belt through loops due to LUE inattention and impaired proprioception.  Pt able to button and  zip pants with increased time.  Pt still needing frequent cues and intermittent min assist to correct posture due to significant posterior pelvic tilt and posterior/leftward lean in sitting and standing.  Pt completed stand pivot back to w/c using RW with mod assist and max cues for sequencing of steps and RW management.  Call bell in reach, seat alarm on at end of session.    Therapy Documentation Precautions:  Precautions Precautions: Fall Precaution Comments: L hemi, L inattention, HOH Restrictions Weight Bearing Restrictions: No    Therapy/Group: Individual Therapy  [name] 08/07/2021, 3:11 PM

## 2021-08-07 NOTE — Progress Notes (Signed)
[facility] PHYSICAL MEDICINE & REHABILITATION PROGRESS NOTE  Subjective/Complaints: C/o low back pain- discussed adding kpad today and applying for no more than 15 minutes at a time Bradycardiac but hypertensive- discussed increasing hydralazine and decreasing lopressor.   ROS: Denies CP, SOB, N/V/D, +right great toe pain- resolved on its own, + low back pain  Objective: Vital Signs: Blood pressure (!) 178/78, pulse (!) 56, temperature 98.7 F (37.1 C), temperature source Oral, resp. rate 18, height 5\' 11"  (1.803 m), weight 74.2 kg, SpO2 97 %. No results found. No results for input(s): WBC, HGB, HCT, PLT in the last 72 hours.  No results for input(s): NA, K, CL, CO2, GLUCOSE, BUN, CREATININE, CALCIUM in the last 72 hours.   Intake/Output Summary (Last 24 hours) at 08/07/2021 1051 Last data filed at 08/07/2021 1005 Gross per 24 hour  Intake 716 ml  Output 1895 ml  Net -1179 ml        Physical Exam: BP (!) 178/78   Pulse (!) 56   Temp 98.7 F (37.1 C) (Oral)   Resp 18   Ht 5\' 11"  (1.803 m)   Wt 74.2 kg   SpO2 97%   BMI 22.81 kg/m  Gen: no distress, normal appearing HEENT: oral mucosa pink and moist, NCAT Cardio: Reg rate Chest: normal effort, normal rate of breathing Abd: soft, non-distended Ext: no edema Psych: pleasant, normal affect Skin: intact Neuro: Makes eye contact with examiner.   Follows commands.   Name and age.   He does have some mild delay in processing. Difficulty with medication management.  Dysarthria, unchanged Motor: RUE/RLE: 4/5 proximal distal LUE: 4-/5 proximal distal LLE: 1+/5 proximal distal, stable  Assessment/Plan: 1. Functional deficits which require 3+ hours per day of interdisciplinary therapy in a comprehensive inpatient rehab setting. Physiatrist is providing close team supervision and 24 hour management of active medical problems listed below. Physiatrist and rehab team continue to assess barriers to discharge/monitor patient  progress toward functional and medical goals   Care Tool:  Bathing    Body parts bathed by patient: Right arm, Left arm, Chest, Abdomen, Front perineal area, Right upper leg, Left upper leg, Face   Body parts bathed by helper: Buttocks, Right lower leg, Left lower leg     Bathing assist Assist Level: Moderate Assistance - Patient 50 - 74%     Upper Body Dressing/Undressing Upper body dressing   What is the patient wearing?: Pull over shirt    Upper body assist Assist Level: Moderate Assistance - Patient 50 - 74%    Lower Body Dressing/Undressing Lower body dressing      What is the patient wearing?: Incontinence brief     Lower body assist Assist for lower body dressing: 2 Helpers     Toileting Toileting    Toileting assist Assist for toileting: 2 Helpers     Transfers Chair/bed transfer  Transfers assist     Chair/bed transfer assist level: 2 Helpers     Locomotion Ambulation   Ambulation assist   Ambulation activity did not occur: Safety/medical concerns  Assist level: 2 helpers (mod A of 1, partial BWS & +2 for litegait management) Assistive device: Lite Gait Max distance: 142ft   Walk 10 feet activity   Assist  Walk 10 feet activity did not occur: Safety/medical concerns        Walk 50 feet activity   Assist Walk 50 feet with 2 turns activity did not occur: Safety/medical concerns  Walk 150 feet activity   Assist Walk 150 feet activity did not occur: Safety/medical concerns         Walk 10 feet on uneven surface  activity   Assist Walk 10 feet on uneven surfaces activity did not occur: Safety/medical concerns         Wheelchair     Assist Is the patient using a wheelchair?: Yes Type of Wheelchair: Manual    Wheelchair assist level: Supervision/Verbal cueing Max wheelchair distance: 57ft    Wheelchair 50 feet with 2 turns activity    Assist        Assist Level: Dependent - Patient 0%    Wheelchair 150 feet activity     Assist      Assist Level: Dependent - Patient 0%    Medical Problem List and Plan: 1.  Left-sided hemiparesis secondary to infarct right posterior lentiform nucleus, thalamus, and tail of the caudate with additional punctate infarct in the lateral left thalamus.   Continue CIR ELOS 25 days; will discuss goals and progress with patient 2.  Antithrombotics: -DVT/anticoagulation:  Pharmaceutical: Continue Heparin             -antiplatelet therapy: Aspirin 81 mg daily and Plavix 75 mg daily until 10/30, then Plavix alone. 3. Low back pain: Add kpad. Continue Lyrica 50 mg 3 times daily, hydrocodone as needed             Monitor with increased exertion 4. Mood: Klonopin 0.25 mg 3 times daily, Prozac 20 mg daily             -antipsychotic agents: N/A 5. Neuropsych: This patient is capable of making decisions on his own behalf. 6. Skin/Wound Care: Routine skin checks 7. Fluids/Electrolytes/Nutrition: Routine in and outs             CMP ordered for tomorrow 8.  Hypertension.  Norvasc 5 mg daily, Toprol-XL 25 mg daily.    Blood pressure is labile, but elevated, as needed hydralazine added on 10/16  Increase Norvasc to 10mg .   Increase hydralazine to 50mg  TID            Monitor with increased mobility 9.  Hyperlipidemia.  Zocor/Zetia 10.  COPD with history of tobacco use.  Continue nebulizers as directed as well as Singulair.  Check oxygen saturations every shift 11.  Diabetes mellitus with hyperglycemia.  Hemoglobin A1c 6.7.  Continue Amaryl 0.5 mg twice daily  Elevated on 10/16, will consider medication adjustments if persistent  Provide dietary education             Monitor with increased mobility 12.  Right side parotid mass.  Follow-up outpatient ENT 13.  CKD stage III.  Baseline creatinine 1.1-1.3.               Cr up to 1.41- place nursing order to encourage 6-8 glasses of water per day and creatinine has normalized to 1.10, monitor weekly.   14.  GERD.  Continue Protonix 15. Right breat toe bunion: requested ortho tech eval for orthotic. Discussed benefits of icing.  15. Bradycardia: decreased lopressor to 25mg    LOS: 7 days A FACE TO FACE EVALUATION WAS PERFORMED  Martha Clan P Matilde Pottenger 08/07/2021, 10:51 AM

## 2021-08-07 NOTE — Progress Notes (Signed)
Speech Language Pathology Weekly Progress and Session Note  Patient Details  Name: Barry [name] MRN: [id number] Date of Birth: [birth year]  Beginning of progress report period:  08/01/2021 End of progress report period:  08/07/2021  Today's Date: 08/07/2021 SLP Individual Time: 1130-1200 SLP Individual Time Calculation (min): 30 min  Short Term Goals: Week 1: SLP Short Term Goal 1 (Week 1): Patient will demonstrate functional problem solving for basic and familiar tasks with Min A verbal cues. SLP Short Term Goal 1 - Progress (Week 1): Met SLP Short Term Goal 2 (Week 1): Patient will utilize external memory aids to recall functional information with Mod A multimodal cues. SLP Short Term Goal 2 - Progress (Week 1): Met SLP Short Term Goal 3 (Week 1): Patient will self-monitor and correct errors during functional tasks with Mod A verbal cues. SLP Short Term Goal 3 - Progress (Week 1): Met SLP Short Term Goal 4 (Week 1): Patient will attend to left visual field/body during functional tasks with Min verbal cues. SLP Short Term Goal 4 - Progress (Week 1): Progressing toward goal    New Short Term Goals: Week 2: SLP Short Term Goal 1 (Week 2): Patient will attend to left visual field/body during functional tasks with supervision level A. SLP Short Term Goal 2 (Week 2): Patient will perform familiar basic and mildly complex problem solving tasks with minA verbal cues. SLP Short Term Goal 3 (Week 2): Patient will self-monitor and correct errors in functional problem solving tasks with minA verbal, visual cues. SLP Short Term Goal 4 (Week 2): Patient will use external memory aides to recall recent events, orient to time with minA verbal cues to utilize.  Weekly Progress Updates:   Patient made good progress and met 3/4 STG's and making progress towards goal he did not meet (left visual field attention). He does appear to be somewhat rigid in the ways he does things (lives by self) and  this leads to some resistance when tasks are presented (medication management, etc) that are different from what he is used to. He is exhibiting improved awareness, attention during problem solving tasks and improved recall of recent events. He continues to benefit from skilled SLP intervention to maximize his cognitive function prior to discharge.   Intensity: Minumum of 1-2 x/day, 30 to 90 minutes Frequency: 3 to 5 out of 7 days Duration/Length of Stay: 11/8 Treatment/Interventions: Cognitive remediation/compensation;Internal/external aids;Therapeutic Activities;Cueing hierarchy;Environmental controls;Functional tasks;Patient/family education   Daily Session  Skilled Therapeutic Interventions: Patient seen in room for skilled ST session focusing on cognitive function goals. Patient did recall working/talking about medication management with SLP previous date when SLP presented similar task.  He required min-modA verbal cues to demonstrate knowledge of types of medications/changes since hospitalization and he would report, "I hope my insulin won't change", etc as he felt that his system of organization of medication and monitoring of blood sugars was successful at home. He was able to describe several tasks completed with therapy earlier today with SLP presenting therapy schedule and providing context, question cues. Patient left in [facility] with alarm belt in place. He continues to benefit from skilled SLP intervention to maximize cognitive function goals prior to discharge.    General    Pain Pain Assessment Pain Scale: 0-10 Pain Score: 0-No pain  Therapy/Group: Individual Therapy   Sonia Baller, MA, CCC-SLP Speech Therapy

## 2021-08-08 LAB — GLUCOSE, CAPILLARY
Glucose-Capillary: 142 mg/dL — ABNORMAL HIGH (ref 70–99)
Glucose-Capillary: 143 mg/dL — ABNORMAL HIGH (ref 70–99)
Glucose-Capillary: 115 mg/dL — ABNORMAL HIGH (ref 70–99)
Glucose-Capillary: 175 mg/dL — ABNORMAL HIGH (ref 70–99)

## 2021-08-08 MED ORDER — HYDRALAZINE HCL 50 MG PO TABS
75.0000 mg | ORAL_TABLET | Freq: Three times a day (TID) | ORAL | Status: DC
  Administered 2021-08-08 – 2021-08-09 (×2): 75 mg via ORAL
  Filled 2021-08-08 (×2): qty 1

## 2021-08-08 MED ORDER — HYDROCODONE-ACETAMINOPHEN 5-325 MG PO TABS: 1.0000 | ORAL_TABLET | Freq: Three times a day (TID) | ORAL | Status: DC | PRN

## 2021-08-08 NOTE — Progress Notes (Signed)
Physical Therapy Session Note  Patient Details  Name: JAELEN SOTH MRN: [id number] Date of Birth: [birth year]  Today's Date: 08/08/2021 PT Individual Time: 1405-1500 PT Individual Time Calculation (min): 55 min   Short Term Goals: Week 1:  PT Short Term Goal 1 (Week 1): Pt will complete bed mobility with maxA PT Short Term Goal 2 (Week 1): Pt will complete bed<>chair transfers with maxA and LRAD PT Short Term Goal 3 (Week 1): Pt will complete sit<>stand wtih maxA and LRAD PT Short Term Goal 4 (Week 1): Pt will demonstrate improved awareness of L side during functional mobility tasks  Skilled Therapeutic Interventions/Progress Updates:    Pt received sitting in w/c and agreeable to therapy session.  Transported to/from gym in w/c for time management and energy conservation.   Assessed vitals: BP 159/67 (MAP 94), HR 56bpm   Sit>stand w/c>B UE support on litegait with CGA and guarding/blocking L knee and with +2 assist donned litegait harness.   Gait training overground while in litegait harness with B UE support as follows:  - 128ft with partial BWS and therapist providing mod assist for L LE management - continues to sit down into harness causing crouched gait posture with excessive B knee flexion - with cuing to extend R LE pt often able to carry this over into affected L LE - requires manual facilitation ~75% of the time for L LE repositioning during swing due to excessive L hip external rotation + hip adduction placing L ankle at risk for rolling - requires heavy manual facilitation ~75-85% of the time for increased L hip/knee extension during stance Vitals immediately after: BP 184/75 (MAP 106), HR 57bpm (no symptoms) After 3 minute seated rest break: BP 155/74 (MAP 97), HR 57bpm   - 162ft without any BWS - initially has worsened crouched gait for ~13ft but with cuing as stated above able to improve, in addition to donning bungee cord to L hip to promote increased forward  advancement of hip and facilitating increased hip extension during stance - continues to require mod assist in addition to step-by-step verbal cuing for improved gait mechanics Vitals immediately after: BP 176/80 (MAP 110), HR 60bpm  After 3 minute seated rest break: BP 164/76 (MAP 103), HR 57bpm   - 42ft as just described without BWS  Standing with CGA doffed litegait harness. Transported back to room and pt requesting to remain in w/c - left with needs in reach and seat belt alarm on.  Therapy Documentation Precautions:  Precautions Precautions: Fall Precaution Comments: L hemi, L inattention, HOH Restrictions Weight Bearing Restrictions: No   Pain:  No reports of pain throughout session.    Therapy/Group: Individual Therapy  Tawana Scale , PT, DPT, NCS, CSRS  08/08/2021, 12:21 PM

## 2021-08-08 NOTE — Progress Notes (Signed)
   08/08/21 1343  What Happened  Was fall witnessed? No  Was patient injured? No  Patient found on floor;other (Comment) (in room right in front of his wheelchair)  Found by Staff-comment  Stated prior activity bathroom-unassisted  Follow Up  MD notified Dr. Adam Phenix  Time MD notified 1350  Family notified Yes - comment  Time family notified 1403  Progress note created (see row info) Yes  Adult Fall Risk Assessment  Risk Factor Category (scoring not indicated) Fall has occurred during this admission (document High fall risk)  Age 87  Fall History: Fall within 6 months prior to admission 5  Elimination; Bowel and/or Urine Incontinence 0  Elimination; Bowel and/or Urine Urgency/Frequency 0  Medications: includes PCA/Opiates, Anti-convulsants, Anti-hypertensives, Diuretics, Hypnotics, Laxatives, Sedatives, and Psychotropics 3  Patient Care Equipment 2  Mobility-Assistance 2  Mobility-Gait 2  Mobility-Sensory Deficit 2  Altered awareness of immediate physical environment 0  Impulsiveness 0  Lack of understanding of one's physical/cognitive limitations 0  Total Score 19  Patient Fall Risk Level High fall risk  Adult Fall Risk Interventions  Required Bundle Interventions *See Row Information* High fall risk - low, moderate, and high requirements implemented  Additional Interventions Use of appropriate toileting equipment (bedpan, BSC, etc.)  Screening for Fall Injury Risk (To be completed on HIGH fall risk patients) - Assessing Need for Floor Mats  Risk For Fall Injury- Criteria for Floor Mats 85 years or older;Bleeding risk-anticoagulation (not prophylaxis)  Will Implement Floor Mats Yes  Vitals  Temp 97.6 F (36.4 C)  Temp Source Oral  BP (!) 151/64  MAP (mmHg) 88  BP Location Left Arm  BP Method Automatic  Patient Position (if appropriate) Sitting  Pulse Rate (!) 58  Pulse Rate Source Monitor  Resp 18  Oxygen Therapy  SpO2 98 %  O2 Device Room Air  Pain Assessment  Pain  Scale 0-10  Pain Score 0  PCA/Epidural/Spinal Assessment  Respiratory Pattern Regular;Unlabored

## 2021-08-08 NOTE — Progress Notes (Signed)
[facility] PHYSICAL MEDICINE & REHABILITATION PROGRESS NOTE  Subjective/Complaints: Had a fall while reaching for his urinal today- sustained no injuries He is concerned about his lower extremity strength but has seen improvements in his left lower extremity strength  ROS: Denies CP, SOB, N/V/D, +right great toe pain- resolved on its own, + low back pain, +lower extremity weakness  Objective: Vital Signs: Blood pressure (!) 156/68, pulse (!) 53, temperature 97.7 F (36.5 C), temperature source Oral, resp. rate 17, height 5\' 11"  (1.803 m), weight 74.2 kg, SpO2 99 %. No results found. No results for input(s): WBC, HGB, HCT, PLT in the last 72 hours.  No results for input(s): NA, K, CL, CO2, GLUCOSE, BUN, CREATININE, CALCIUM in the last 72 hours.   Intake/Output Summary (Last 24 hours) at 08/08/2021 1800 Last data filed at 08/08/2021 1350 Gross per 24 hour  Intake 1060 ml  Output 1950 ml  Net -890 ml        Physical Exam: BP (!) 156/68 (BP Location: Left Arm)   Pulse (!) 53   Temp 97.7 F (36.5 C) (Oral)   Resp 17   Ht 5\' 11"  (1.803 m)   Wt 74.2 kg   SpO2 99%   BMI 22.81 kg/m  Gen: no distress, normal appearing HEENT: oral mucosa pink and moist, NCAT Cardio: Bradycardia Chest: normal effort, normal rate of breathing Abd: soft, non-distended Ext: no edema Psych: pleasant, normal affect Skin: intact Neuro: Makes eye contact with examiner.   Follows commands.   Name and age.   He does have some mild delay in processing. Difficulty with medication management.  Dysarthria, unchanged Motor: RUE/RLE: 4/5 proximal distal LUE: 4-/5 proximal distal LLE: 1+/5 proximal distal, stable  Assessment/Plan: 1. Functional deficits which require 3+ hours per day of interdisciplinary therapy in a comprehensive inpatient rehab setting. Physiatrist is providing close team supervision and 24 hour management of active medical problems listed below. Physiatrist and rehab team continue  to assess barriers to discharge/monitor patient progress toward functional and medical goals   Care Tool:  Bathing    Body parts bathed by patient: Right arm, Left arm, Chest, Abdomen, Front perineal area, Right upper leg, Left upper leg, Face   Body parts bathed by helper: Buttocks, Right lower leg, Left lower leg     Bathing assist Assist Level: Moderate Assistance - Patient 50 - 74%     Upper Body Dressing/Undressing Upper body dressing   What is the patient wearing?: Pull over shirt    Upper body assist Assist Level: Moderate Assistance - Patient 50 - 74%    Lower Body Dressing/Undressing Lower body dressing      What is the patient wearing?: Incontinence brief     Lower body assist Assist for lower body dressing: 2 Helpers     Toileting Toileting    Toileting assist Assist for toileting: 2 Helpers     Transfers Chair/bed transfer  Transfers assist     Chair/bed transfer assist level: 2 Helpers     Locomotion Ambulation   Ambulation assist   Ambulation activity did not occur: Safety/medical concerns  Assist level: 2 helpers (mod A of 1, partial BWS & +2 for litegait management) Assistive device: Lite Gait Max distance: 181ft   Walk 10 feet activity   Assist  Walk 10 feet activity did not occur: Safety/medical concerns        Walk 50 feet activity   Assist Walk 50 feet with 2 turns activity did not occur: Safety/medical concerns  Walk 150 feet activity   Assist Walk 150 feet activity did not occur: Safety/medical concerns         Walk 10 feet on uneven surface  activity   Assist Walk 10 feet on uneven surfaces activity did not occur: Safety/medical concerns         Wheelchair     Assist Is the patient using a wheelchair?: Yes Type of Wheelchair: Manual    Wheelchair assist level: Supervision/Verbal cueing Max wheelchair distance: 38ft    Wheelchair 50 feet with 2 turns activity    Assist         Assist Level: Dependent - Patient 0%   Wheelchair 150 feet activity     Assist      Assist Level: Dependent - Patient 0%    Medical Problem List and Plan: 1.  Left-sided hemiparesis secondary to infarct right posterior lentiform nucleus, thalamus, and tail of the caudate with additional punctate infarct in the lateral left thalamus.   Continue CIR ELOS 25 days; will discuss goals and progress with patient 2.  Antithrombotics: -DVT/anticoagulation:  Pharmaceutical: Continue Heparin             -antiplatelet therapy: Aspirin 81 mg daily and Plavix 75 mg daily until 10/30, then Plavix alone. 3. Low back pain: Add kpad. Continue Lyrica 50 mg 3 times daily, Decrease hydrocodone to 1 tablet q8H prn.              Monitor with increased exertion 4. Anxiety: Continue Klonopin 0.25 mg 3 times daily, Prozac 20 mg daily             -antipsychotic agents: N/A 5. Neuropsych: This patient is capable of making decisions on his own behalf. 6. Skin/Wound Care: Routine skin checks 7. Fluids/Electrolytes/Nutrition: Routine in and outs             CMP ordered for tomorrow 8.  Hypertension.  Norvasc 5 mg daily, Toprol-XL 25 mg daily.    Blood pressure is labile, but elevated, as needed hydralazine added on 10/16  Increase Norvasc to 10mg .   Increase hydralazine to 75mg  TID            Monitor with increased mobility 9.  Hyperlipidemia.  Zocor/Zetia 10.  COPD with history of tobacco use.  Continue nebulizers as directed as well as Singulair.  Check oxygen saturations every shift 11.  Diabetes mellitus with hyperglycemia.  Hemoglobin A1c 6.7.  Continue Amaryl 0.5 mg twice daily  Elevated on 10/16, will consider medication adjustments if persistent  Provide dietary education             Monitor with increased mobility 12.  Right side parotid mass.  Follow-up outpatient ENT 13.  CKD stage III.  Baseline creatinine 1.1-1.3.               Cr up to 1.41- place nursing order to encourage 6-8 glasses  of water per day and creatinine has normalized to 1.10, monitor weekly.  14.  GERD.  Continue Protonix 15. Right breat toe bunion: requested ortho tech eval for orthotic. Discussed benefits of icing.  15. Bradycardia: decreased lopressor to 25mg    LOS: 8 days A FACE TO FACE EVALUATION WAS PERFORMED  Adina Puzzo P Jodye Scali 08/08/2021, 6:00 PM

## 2021-08-08 NOTE — Progress Notes (Signed)
Patient continues to deny any pain or discomfort from previous fall.

## 2021-08-09 LAB — GLUCOSE, CAPILLARY
Glucose-Capillary: 154 mg/dL — ABNORMAL HIGH (ref 70–99)
Glucose-Capillary: 243 mg/dL — ABNORMAL HIGH (ref 70–99)
Glucose-Capillary: 129 mg/dL — ABNORMAL HIGH (ref 70–99)
Glucose-Capillary: 161 mg/dL — ABNORMAL HIGH (ref 70–99)

## 2021-08-09 MED ORDER — HYDRALAZINE HCL 50 MG PO TABS
100.0000 mg | ORAL_TABLET | Freq: Three times a day (TID) | ORAL | Status: DC
  Administered 2021-08-09 – 2021-08-26 (×51): 100 mg via ORAL
  Filled 2021-08-09 (×51): qty 2

## 2021-08-09 MED ORDER — HYDROCODONE-ACETAMINOPHEN 5-325 MG PO TABS: 1.0000 | ORAL_TABLET | Freq: Two times a day (BID) | ORAL | Status: DC | PRN

## 2021-08-09 MED ORDER — METOPROLOL SUCCINATE ER 25 MG PO TB24
12.5000 mg | ORAL_TABLET | Freq: Every day | ORAL | Status: DC
  Administered 2021-08-10 – 2021-08-26 (×17): 12.5 mg via ORAL
  Filled 2021-08-09 (×17): qty 1

## 2021-08-09 NOTE — Progress Notes (Signed)
[place] PHYSICAL MEDICINE & REHABILITATION PROGRESS NOTE  Subjective/Complaints: No pain or discomfort from his fall yesterday He has no new complaints He asks if I think his lower extremity strength will improve Discussed continuing to wean down on his pain medications  ROS: Denies CP, SOB, N/V/D, +right great toe pain- resolved on its own, + low back pain, +lower extremity weakness- improving  Objective: Vital Signs: Blood pressure (!) 158/62, pulse 62, temperature 98.7 F (37.1 C), temperature source Oral, resp. rate 18, height 5\' 11"  (1.803 m), weight 74.2 kg, SpO2 96 %. No results found. No results for input(s): WBC, HGB, HCT, PLT in the last 72 hours.  No results for input(s): NA, K, CL, CO2, GLUCOSE, BUN, CREATININE, CALCIUM in the last 72 hours.   Intake/Output Summary (Last 24 hours) at 08/09/2021 1237 Last data filed at 08/09/2021 0804 Gross per 24 hour  Intake 1180 ml  Output 1950 ml  Net -770 ml        Physical Exam: BP (!) 158/62 (BP Location: Left Arm)   Pulse 62   Temp 98.7 F (37.1 C) (Oral)   Resp 18   Ht 5\' 11"  (1.803 m)   Wt 74.2 kg   SpO2 96%   BMI 22.81 kg/m  Gen: no distress, normal appearing HEENT: oral mucosa pink and moist, NCAT Cardio: Bradycardia Chest: normal effort, normal rate of breathing Abd: soft, non-distended Ext: no edema Psych: pleasant, normal affect Skin: intact Neuro: Makes eye contact with examiner.   Follows commands.   Name and age.   He does have some mild delay in processing. Difficulty with medication management.  Dysarthria, unchanged Motor: RUE/RLE: 4/5 proximal distal LUE: 4-/5 proximal distal LLE: 3/5 proximal 1/5 distal, improving  Assessment/Plan: 1. Functional deficits which require 3+ hours per day of interdisciplinary therapy in a comprehensive inpatient rehab setting. Physiatrist is providing close team supervision and 24 hour management of active medical problems listed below. Physiatrist and  rehab team continue to assess barriers to discharge/monitor patient progress toward functional and medical goals   Care Tool:  Bathing    Body parts bathed by patient: Right arm, Left arm, Chest, Abdomen, Front perineal area, Right upper leg, Left upper leg, Face   Body parts bathed by helper: Buttocks, Right lower leg, Left lower leg     Bathing assist Assist Level: Moderate Assistance - Patient 50 - 74%     Upper Body Dressing/Undressing Upper body dressing   What is the patient wearing?: Pull over shirt    Upper body assist Assist Level: Moderate Assistance - Patient 50 - 74%    Lower Body Dressing/Undressing Lower body dressing      What is the patient wearing?: Incontinence brief     Lower body assist Assist for lower body dressing: 2 Helpers     Toileting Toileting    Toileting assist Assist for toileting: 2 Helpers     Transfers Chair/bed transfer  Transfers assist     Chair/bed transfer assist level: 2 Helpers     Locomotion Ambulation   Ambulation assist   Ambulation activity did not occur: Safety/medical concerns  Assist level: 2 helpers Assistive device: Lite Gait Max distance: 144ft   Walk 10 feet activity   Assist  Walk 10 feet activity did not occur: Safety/medical concerns        Walk 50 feet activity   Assist Walk 50 feet with 2 turns activity did not occur: Safety/medical concerns  Walk 150 feet activity   Assist Walk 150 feet activity did not occur: Safety/medical concerns         Walk 10 feet on uneven surface  activity   Assist Walk 10 feet on uneven surfaces activity did not occur: Safety/medical concerns         Wheelchair     Assist Is the patient using a wheelchair?: Yes Type of Wheelchair: Manual    Wheelchair assist level: Supervision/Verbal cueing Max wheelchair distance: 9ft    Wheelchair 50 feet with 2 turns activity    Assist        Assist Level: Dependent -  Patient 0%   Wheelchair 150 feet activity     Assist      Assist Level: Dependent - Patient 0%    Medical Problem List and Plan: 1.  Left-sided hemiparesis secondary to infarct right posterior lentiform nucleus, thalamus, and tail of the caudate with additional punctate infarct in the lateral left thalamus.   Continue CIR ELOS 25 days; encouraged regarding his excellent progress and improvements in his strength! 2.  Antithrombotics: -DVT/anticoagulation:  Pharmaceutical: Continue Heparin             -antiplatelet therapy: Aspirin 81 mg daily and Plavix 75 mg daily until 10/30, then Plavix alone. 3. Low back pain: Add kpad. Continue Lyrica 50 mg 3 times daily, Decrease hydrocodone to 1 tablet q12H prn.              Monitor with increased exertion 4. Anxiety: Continue Klonopin 0.25 mg 3 times daily, Prozac 20 mg daily             -antipsychotic agents: N/A 5. Neuropsych: This patient is capable of making decisions on his own behalf. 6. Skin/Wound Care: Routine skin checks 7. Fluids/Electrolytes/Nutrition: Routine in and outs             CMP ordered for tomorrow 8.  Hypertension.  Norvasc 5 mg daily, Toprol-XL 25 mg daily.    Blood pressure is labile, but elevated, as needed hydralazine added on 10/16  Increase Norvasc to 10mg .   Increase hydralazine to 100 mg TID            Monitor with increased mobility 9.  Hyperlipidemia.  Zocor/Zetia 10.  COPD with history of tobacco use.  Continue nebulizers as directed as well as Singulair.  Check oxygen saturations every shift 11.  Diabetes mellitus with hyperglycemia.  Hemoglobin A1c 6.7.  Continue Amaryl 0.5 mg twice daily  Elevated on 10/16, will consider medication adjustments if persistent  Provide dietary education             Monitor with increased mobility 12.  Right side parotid mass.  Follow-up outpatient ENT 13.  CKD stage III.  Baseline creatinine 1.1-1.3.               Cr up to 1.41- place nursing order to encourage 6-8  glasses of water per day and creatinine has normalized to 1.10, monitor weekly.  14.  GERD.  Continue Protonix 15. Right breat toe bunion: requested ortho tech eval for orthotic. Discussed benefits of icing.  15. Bradycardia: decrease lopressor to 12.5mg    LOS: 9 days A FACE TO FACE EVALUATION WAS PERFORMED  Clide Deutscher Fortune Brannigan 08/09/2021, 12:37 PM

## 2021-08-10 ENCOUNTER — Other Ambulatory Visit: Payer: Self-pay

## 2021-08-10 LAB — GLUCOSE, CAPILLARY
Glucose-Capillary: 149 mg/dL — ABNORMAL HIGH (ref 70–99)
Glucose-Capillary: 134 mg/dL — ABNORMAL HIGH (ref 70–99)
Glucose-Capillary: 173 mg/dL — ABNORMAL HIGH (ref 70–99)
Glucose-Capillary: 106 mg/dL — ABNORMAL HIGH (ref 70–99)

## 2021-08-10 NOTE — Progress Notes (Signed)
**  Late Entry**    08/08/21 1343  What Happened  Was fall witnessed? No  Was patient injured? No  Patient found on floor;other (Comment) (in room right in front of his wheelchair)  Found by Staff-comment  Stated prior activity bathroom-unassisted  Follow Up  MD notified Dr. Adam Phenix  Time MD notified 1350  Family notified Yes - comment  Time family notified 1403  Progress note created (see row info) Yes  Adult Fall Risk Assessment  Risk Factor Category (scoring not indicated) Fall has occurred during this admission (document High fall risk)  Age 87  Fall History: Fall within 6 months prior to admission 5  Elimination; Bowel and/or Urine Incontinence 0  Elimination; Bowel and/or Urine Urgency/Frequency 0  Medications: includes PCA/Opiates, Anti-convulsants, Anti-hypertensives, Diuretics, Hypnotics, Laxatives, Sedatives, and Psychotropics 3  Patient Care Equipment 2  Mobility-Assistance 2  Mobility-Gait 2  Mobility-Sensory Deficit 2  Altered awareness of immediate physical environment 0  Impulsiveness 0  Lack of understanding of one's physical/cognitive limitations 0  Total Score 19  Patient Fall Risk Level High fall risk  Adult Fall Risk Interventions  Required Bundle Interventions *See Row Information* High fall risk - low, moderate, and high requirements implemented  Additional Interventions Use of appropriate toileting equipment (bedpan, BSC, etc.)  Screening for Fall Injury Risk (To be completed on HIGH fall risk patients) - Assessing Need for Floor Mats  Risk For Fall Injury- Criteria for Floor Mats 85 years or older;Bleeding risk-anticoagulation (not prophylaxis)  Will Implement Floor Mats Yes  Vitals  Temp 97.6 F (36.4 C)  Temp Source Oral  BP (!) 151/64  MAP (mmHg) 88  BP Location Left Arm  BP Method Automatic  Patient Position (if appropriate) Sitting  Pulse Rate (!) 58  Pulse Rate Source Monitor  Resp 18  Oxygen Therapy  SpO2 98 %  O2 Device Room Air  Pain  Assessment  Pain Scale 0-10  Pain Score 0  PCA/Epidural/Spinal Assessment  Respiratory Pattern Regular;Unlabored

## 2021-08-10 NOTE — Progress Notes (Signed)
Speech Language Pathology Daily Session Note  Patient Details  Name: Barry [name] MRN: [id number] Date of Birth: Jan 16, 1933  Today's Date: 08/10/2021 SLP Individual Time: 5465-0354 SLP Individual Time Calculation (min): 25 min  Short Term Goals: Week 2: SLP Short Term Goal 1 (Week 2): Patient will attend to left visual field/body during functional tasks with supervision level A. SLP Short Term Goal 2 (Week 2): Patient will perform familiar basic and mildly complex problem solving tasks with minA verbal cues. SLP Short Term Goal 3 (Week 2): Patient will self-monitor and correct errors in functional problem solving tasks with minA verbal, visual cues. SLP Short Term Goal 4 (Week 2): Patient will use external memory aides to recall recent events, orient to time with minA verbal cues to utilize.  Skilled Therapeutic Interventions: Pt seen for skilled ST with focus on cognitive goals, pt sitting upright in wheelchair and requiring encouragement to participate in therapy session. Pt agitated with SLP stating he "wasn't going to change" and he's going to do things at home the way he always has. Education on plans for therapy today (left scanning/left attention) initially resulted in increased participation. SLP facilitating simple map activity by providing visual cue (bright color) to increase complete scan to left field on paper. Pt demonstrates no difficulty attending to L side during task, however did have difficulty understanding cardinal directions and problem solving task requiring mod A verbal cues. Pt able to read paragraph level material related to CVA with no apparent difficulty attending to left side of page. Will continue to educate and assess impact on functional tasks. At this point, pt becoming irritated SLP was "interrupting time with his company", therefore session ended. Pt left in wheelchair with alarm set and friend present. Cont ST POC.   Pain Pain Assessment Pain Scale:  0-10 Pain Score: 0-No pain  Therapy/Group: Individual Therapy  Barry [name] 08/10/2021, 4:21 PM

## 2021-08-10 NOTE — Progress Notes (Signed)
Occupational Therapy Weekly Progress Note  Patient Details  Name: Barry [name] MRN: [id number] Date of Birth: May 12, 1933  Beginning of progress report period: August 01, 2021 End of progress report period: August 10, 2021  Today's Date: 08/10/2021 OT Individual Time: 0804-0900 OT Individual Time Calculation (min): 56 min    Patient has met 5 of 5 short term goals.  Patient demonstrates good progress toward goals completing UB ADLs with Min A overall and LB ADLs with Max A,  L knee block and use of RW. Patient able to maintain static sitting balance with supervision A and cues to correct L lateral bias (visual feedback from mirror helpful). Patient also demonstrating stand-pivot transfers with Max A, multimodal cues for foot placement, walker management and upright posture with L knee block.   Patient continues to demonstrate the following deficits: muscle weakness and muscle paralysis and impaired timing and sequencing, abnormal tone, unbalanced muscle activation, ataxia, decreased coordination, and decreased motor planning and therefore will continue to benefit from skilled OT intervention to enhance overall performance with BADL and Reduce care partner burden.  Patient progressing toward long term goals..  Continue plan of care.  OT Short Term Goals Week 1:  OT Short Term Goal 1 (Week 1): Pt will maintain static sitting balance EOB with no more than intermittent min A in prep for seated ADL. OT Short Term Goal 1 - Progress (Week 1): Met OT Short Term Goal 2 (Week 1): Pt will completed sit to stand with max of 1 in prep for standing ADL. OT Short Term Goal 2 - Progress (Week 1): Met OT Short Term Goal 3 (Week 1): Pt will don shirt with min A. OT Short Term Goal 3 - Progress (Week 1): Met OT Short Term Goal 4 (Week 1): Pt will don pants with max A. OT Short Term Goal 4 - Progress (Week 1): Met OT Short Term Goal 5 (Week 1): Pt will complete toilet transfer with max and LRAD. OT  Short Term Goal 5 - Progress (Week 1): Met Week 2:  OT Short Term Goal 1 (Week 2): Patient will complete sit to stand transfers with Mod A and LRAD. OT Short Term Goal 2 (Week 2): Patient with complete toilet transfers with Mod A and LRAD. OT Short Term Goal 3 (Week 2): Patient will complete LB dressing with Mod A in sitting/standing with hemi technique and LRAD. OT Short Term Goal 4 (Week 2): Patient will complete 2/3 parts of toileting task with Mod A in sitting/standing with good safety awareness and LRAD.  Patient met lying supine in bed in agreement with OT treatment session. Mild chronic pain in back reported at rest and with activity. Supine to EOB with cues for technique to advance LLE and cues to elevate trunk. Able to scoot to EOB with external assist to maintain sitting balance. UB dressing with Min A and LB dressing with Max A, cues for hemi technique and L knee block. Stand-pivot to wc on R with Max A and Max multimodal cues for foot placement, upright posture and L knee block. Seated at sink level, patient completed 3/3 grooming tasks with supervision A. Patient reporting need for BM. Max A for stand-pivot to [facility] over standard height toilet and Max A for hygiene/clothing management in standing with L knee block and cues for upright posture. Hand hygiene seated at sink level with Min A. MNR using blue foam squares and styrofoam cup with focus on [facility]. Patient then able to prepare coffee by  opening sugar packet and creamer with mild difficulty. Session concluded with patient seated in wc with call bell within reach, belt alarm activated and all needs met.   Therapy Documentation Precautions:  Precautions Precautions: Fall Precaution Comments: L hemi, L inattention, HOH Restrictions Weight Bearing Restrictions: No General:    Therapy/Group: Individual Therapy  Lundynn Cohoon R Howerton-Davis 08/10/2021, 8:43 AM

## 2021-08-10 NOTE — Progress Notes (Signed)
Physical Therapy Weekly Progress Note  Patient Details  Name: Barry [name] MRN: [id number] Date of Birth: [birth year]  Beginning of progress report period: August 01, 2021 End of progress report period: August 10, 2021  Today's Date: 08/10/2021 PT Individual Time: 1000-1055 + 1445-1525 PT Individual Time Calculation (min): 55 min + 40 min  Patient has met 4 of 4 short term goals. Pt is making appropriate progress towards his LTG. He requires modA for bed mobility, modA for squat<>pivot transfers, min to modA for sit<>stand transfers with BUE support, and is ambulating ~17f with use of LiteGait overground with partial BWS and modA. He continues to be primarily limited by L inattention, L sided weakness, postural control deficits, decreased safety awareness, and unbalanced muscle activation. Continue to treat per POC.   Patient continues to demonstrate the following deficits muscle weakness, decreased cardiorespiratoy endurance, impaired timing and sequencing, unbalanced muscle activation, motor apraxia, decreased coordination, and decreased motor planning, decreased initiation, decreased attention, decreased awareness, decreased problem solving, decreased safety awareness, decreased memory, and delayed processing, and decreased sitting balance, decreased standing balance, decreased postural control, hemiplegia, and decreased balance strategies and therefore will continue to benefit from skilled PT intervention to increase functional independence with mobility.  Patient progressing toward long term goals..  Continue plan of care.  PT Short Term Goals Week 1:  PT Short Term Goal 1 (Week 1): Pt will complete bed mobility with maxA PT Short Term Goal 1 - Progress (Week 1): Met PT Short Term Goal 2 (Week 1): Pt will complete bed<>chair transfers with maxA and LRAD PT Short Term Goal 2 - Progress (Week 1): Met PT Short Term Goal 3 (Week 1): Pt will complete sit<>stand wtih maxA and LRAD PT  Short Term Goal 3 - Progress (Week 1): Met PT Short Term Goal 4 (Week 1): Pt will demonstrate improved awareness of L side during functional mobility tasks PT Short Term Goal 4 - Progress (Week 1): Met Week 2:  PT Short Term Goal 1 (Week 2): Pt will complete bed mobility with minA PT Short Term Goal 2 (Week 2): Pt will complete bed<>chair transfers with minA and LRAD PT Short Term Goal 3 (Week 2): Pt will ambulate 739fwith minA and LRAD  Skilled Therapeutic Interventions/Progress Updates:     1st session: Pt seen sitting in w/c to start session - agreeable to PT tx. No reports of pain during treatment session. Transported in w/c to main rehab gym for energy conservation and time. Completed squat<>pivot transfer with modA towards stronger L side from w/c to mat table - cues needed for safety, sequencing, and setup. Able to sit unsupported at edge of mat table with SBA. With mirror for visual aid and back of arm chair for UE support, worked on repeated sit<>stands, 2x10, for active warm up with verbal and tactile cues needed to reduce L lean and engage quads/hips to get knee extension. Next, worked on unilateral marching in similar setup, working on stance phase for the paretic L leg and postural control in dynamic movement patterns. Required minA to start, fading to modA due to weakness and fatigue. Next, worked on functional gait training for remainder of session. Via face-to-face technique with +2 assist for w/c follow - he ambulated ~1531fith mod/maxA with therapist assisting facilitating lateral weight shift and hip extension - demonstrates narrow BOS, narrow stepping on L, soft knee on L, and inconsistent step lengths on L. Retrieved upright EVA walker to work on advancing gait. He ambulated [id number]  29f + 1026fwith modA and +2 assist for managing walker. Much improved upright postural control and ability to laterally weight shift. He needed minA for LLE but modA for postural control. Continues to  demonstrate "soft" knees in L>R with no true buckling. Pelvic weakness noted for hip abductors. Pt returned to his room at completion of session, remained seated in w/c with safety belt alarm on, all needs in reach, pt pleased with performance.   2nd session: Pt received sitting in w/c, agreeable to PT treatment. Does not report pain during session. Transported in w/c to main rehab gym for time. Completed squat<>pivot transfer towards R side with modA from w/c to mat table. Assisted to supine position with modA for trunk and BLE management. In supine position, worked on L hip strengthening with bridges, isometric holds in bridging position, and straight leg hip abduction - 2x10 sets each with 5 second holds for bridging. Completed supine>R sidelying with supervision assist and then continued L hip strengthening by completing clamshells, 3x10 reps with tactile cues for feedback for activate glut musculature. Next, pt was instructed and assisted into tall kneeling with UE support on table-top. Completed R sidelying to prone with minA and then prone to quadruped with minA. Required modA for quadruped to tall kneeling and noted some motor apraxia during these transitions. In tall kneeling, worked on glut activation with tall kneeling glut sets and short kneeling to tall kneeling transitions. Again, some motor planning deficits impacting sequencing for these tasks. Pt assisted in reverse order from tall kneeling -> quadruped -> prone -> supine -> sitting edge of mat table, all requiring modA. Completed squat<>pivot transfer with modA from mat table to w/c, towards weaker L side - difficulty clearing hips and required modA for repositioning in chair. Pt returned to room and requested to remain seated in w/c as he was expecting company. Safety belt alarm on, all needs within reach at conclusion of session.   Therapy Documentation Precautions:  Precautions Precautions: Fall Precaution Comments: L hemi, L  inattention, HOH Restrictions Weight Bearing Restrictions: No General:    Therapy/Group: Individual Therapy  Nyasia Baxley P Laker Thompson PT 08/10/2021, 7:35 AM

## 2021-08-10 NOTE — Progress Notes (Signed)
[place] PHYSICAL MEDICINE & REHABILITATION PROGRESS NOTE  Subjective/Complaints:  No issues overnite , Feels like LLE strength improving    ROS: Denies CP, SOB, N/V/D, +right great toe pain- resolved on its own, + low back pain, +lower extremity weakness- improving  Objective: Vital Signs: Blood pressure (!) 160/79, pulse (!) 57, temperature 98.2 F (36.8 C), resp. rate 18, height 5\' 11"  (1.803 m), weight 74.2 kg, SpO2 97 %. No results found. No results for input(s): WBC, HGB, HCT, PLT in the last 72 hours.  No results for input(s): NA, K, CL, CO2, GLUCOSE, BUN, CREATININE, CALCIUM in the last 72 hours.   Intake/Output Summary (Last 24 hours) at 08/10/2021 0733 Last data filed at 08/10/2021 0612 Gross per 24 hour  Intake 720 ml  Output 1400 ml  Net -680 ml         Physical Exam: BP (!) 160/79   Pulse (!) 57   Temp 98.2 F (36.8 C)   Resp 18   Ht 5\' 11"  (1.803 m)   Wt 74.2 kg   SpO2 97%   BMI 22.81 kg/m   General: No acute distress Mood and affect are appropriate Heart: Regular rate and rhythm no rubs murmurs or extra sounds Lungs: Clear to auscultation, breathing unlabored, no rales or wheezes Abdomen: Positive bowel sounds, soft nontender to palpation, nondistended Extremities: No clubbing, cyanosis, or edema Skin: No evidence of breakdown, no evidence of rash   Neuro: Makes eye contact with examiner.   Follows commands.   Name and age.   He does have some mild delay in processing. Difficulty with medication management.  Dysarthria, unchanged Motor: RUE/RLE: 4/5 proximal distal LUE: 4-/5 proximal distal LLE: 3/5 proximal 2/5 distal,  Dysmetria LUE and LLE  Assessment/Plan: 1. Functional deficits which require 3+ hours per day of interdisciplinary therapy in a comprehensive inpatient rehab setting. Physiatrist is providing close team supervision and 24 hour management of active medical problems listed below. Physiatrist and rehab team continue to  assess barriers to discharge/monitor patient progress toward functional and medical goals   Care Tool:  Bathing    Body parts bathed by patient: Right arm, Left arm, Chest, Abdomen, Front perineal area, Right upper leg, Left upper leg, Face   Body parts bathed by helper: Buttocks, Right lower leg, Left lower leg     Bathing assist Assist Level: Moderate Assistance - Patient 50 - 74%     Upper Body Dressing/Undressing Upper body dressing   What is the patient wearing?: Pull over shirt    Upper body assist Assist Level: Moderate Assistance - Patient 50 - 74%    Lower Body Dressing/Undressing Lower body dressing      What is the patient wearing?: Incontinence brief     Lower body assist Assist for lower body dressing: 2 Helpers     Toileting Toileting    Toileting assist Assist for toileting: 2 Helpers     Transfers Chair/bed transfer  Transfers assist     Chair/bed transfer assist level: 2 Helpers     Locomotion Ambulation   Ambulation assist   Ambulation activity did not occur: Safety/medical concerns  Assist level: 2 helpers Assistive device: Lite Gait Max distance: 170ft   Walk 10 feet activity   Assist  Walk 10 feet activity did not occur: Safety/medical concerns        Walk 50 feet activity   Assist Walk 50 feet with 2 turns activity did not occur: Safety/medical concerns  Walk 150 feet activity   Assist Walk 150 feet activity did not occur: Safety/medical concerns         Walk 10 feet on uneven surface  activity   Assist Walk 10 feet on uneven surfaces activity did not occur: Safety/medical concerns         Wheelchair     Assist Is the patient using a wheelchair?: Yes Type of Wheelchair: Manual    Wheelchair assist level: Supervision/Verbal cueing Max wheelchair distance: 84ft    Wheelchair 50 feet with 2 turns activity    Assist        Assist Level: Dependent - Patient 0%   Wheelchair  150 feet activity     Assist      Assist Level: Dependent - Patient 0%    Medical Problem List and Plan: 1.  Left-sided hemiparesis secondary to infarct right posterior lentiform nucleus, thalamus, and tail of the caudate with additional punctate infarct in the lateral left thalamus.   Continue CIR ELOS 25 days; encouraged regarding his excellent progress and improvements in his strength! 2.  Antithrombotics: -DVT/anticoagulation:  Pharmaceutical: Continue Heparin             -antiplatelet therapy: Aspirin 81 mg daily and Plavix 75 mg daily until 10/30, then Plavix alone. 3. Low back pain: Add kpad. Continue Lyrica 50 mg 3 times daily, Decrease hydrocodone to 1 tablet q12H prn.              Monitor with increased exertion 4. Anxiety: Continue Klonopin 0.25 mg 3 times daily, Prozac 20 mg daily             -antipsychotic agents: N/A 5. Neuropsych: This patient is capable of making decisions on his own behalf. 6. Skin/Wound Care: Routine skin checks 7. Fluids/Electrolytes/Nutrition: Routine in and outs             CMP ordered for tomorrow 8.  Hypertension.  Norvasc 5 mg daily, Toprol-XL 25 mg daily.    Blood pressure is labile, but elevated, as needed hydralazine added on 10/16  Increase Norvasc to 10mg .   Increase hydralazine to 100 mg TID- monitor effect            Monitor with increased mobility 9.  Hyperlipidemia.  Zocor/Zetia 10.  COPD with history of tobacco use.  Continue nebulizers as directed as well as Singulair.  Check oxygen saturations every shift 11.  Diabetes mellitus with hyperglycemia.  Hemoglobin A1c 6.7.  Continue Amaryl 0.5 mg twice daily  Elevated on 10/16, will consider medication adjustments if persistent  Provide dietary education             Monitor with increased mobility 12.  Right side parotid mass.  Follow-up outpatient ENT 13.  CKD stage III.  Baseline creatinine 1.1-1.3.               Cr up to 1.41- place nursing order to encourage 6-8 glasses of  water per day and creatinine has normalized to 1.10, monitor weekly.  Repeat in am  14.  GERD.  Continue Protonix 15. Right breat toe bunion: requested ortho tech eval for orthotic. Discussed benefits of icing.  15. Bradycardia: decrease lopressor to 12.5mg  Vitals:   08/10/21 0513 08/10/21 0515  BP: (!) 160/79 (!) 160/79  Pulse:  (!) 57  Resp:    Temp:  98.2 F (36.8 C)  SpO2:  97%      LOS: 10 days A FACE TO FACE EVALUATION WAS PERFORMED  Luanna Salk  Gordon Vandunk 08/10/2021, 7:33 AM

## 2021-08-11 LAB — BASIC METABOLIC PANEL
Anion gap: 6 (ref 5–15)
BUN: 25 mg/dL — ABNORMAL HIGH (ref 8–23)
CO2: 25 mmol/L (ref 22–32)
Calcium: 8.7 mg/dL — ABNORMAL LOW (ref 8.9–10.3)
Chloride: 103 mmol/L (ref 98–111)
Creatinine, Ser: 1.13 mg/dL (ref 0.61–1.24)
GFR, Estimated: >60 mL/min (ref >60)
Glucose, Bld: 134 mg/dL — ABNORMAL HIGH (ref 70–99)
Potassium: 4 mmol/L (ref 3.5–5.1)
Sodium: 134 mmol/L — ABNORMAL LOW (ref 135–145)

## 2021-08-11 LAB — GLUCOSE, CAPILLARY
Glucose-Capillary: 120 mg/dL — ABNORMAL HIGH (ref 70–99)
Glucose-Capillary: 114 mg/dL — ABNORMAL HIGH (ref 70–99)
Glucose-Capillary: 183 mg/dL — ABNORMAL HIGH (ref 70–99)
Glucose-Capillary: 114 mg/dL — ABNORMAL HIGH (ref 70–99)

## 2021-08-11 MED ORDER — HYDROCODONE-ACETAMINOPHEN 5-325 MG PO TABS: 1.0000 | ORAL_TABLET | Freq: Every day | ORAL | Status: DC | PRN

## 2021-08-11 NOTE — Progress Notes (Signed)
Patient ID: Barry [name], male   DOB: 06-Jul-1933, 87 y.o.   MRN: [id number] Spoke with son via telephone to discuss Dad's care and advanced directives. Have left the packet in pt's room for son when he visits tonight. Made son aware pt will require 24/7 physical care at discharge. Encouraged him to come in and see Dad in therapies. Will call tomorrow after team conference.

## 2021-08-11 NOTE — Progress Notes (Signed)
Physical Therapy Session Note  Patient Details  Name: Barry [name] MRN: [id number] Date of Birth: [birth year]  Today's Date: 08/11/2021 PT Individual Time: 1400-1500 PT Individual Time Calculation (min): 60 min   Short Term Goals: Week 2:  PT Short Term Goal 1 (Week 2): Pt will complete bed mobility with minA PT Short Term Goal 2 (Week 2): Pt will complete bed<>chair transfers with minA and LRAD PT Short Term Goal 3 (Week 2): Pt will ambulate 93ft with minA and LRAD  Skilled Therapeutic Interventions/Progress Updates:     Pt greeted seated in w/c and agreeable to PT treatment. No reports of pain. Transported to day room rehab gym for time. Completed squat<>pivot transfer towards R side with modA from w/c to mat table. Able to sit unsupported at edge of mat with SBA.   Donned LiteGait harness in sitting and with LiteGait overground and BUE support with minimal BWS, he ambulated ~141ft + ~140ft with +2 assist for driving/steering LiteGait. 1st gait trial, he required min/modA for facilitating gait mechanics, primarily for postural control and occasional knee blocking during stance phase. Continues to demonstrate varus of the knees and lacks full weight shift on his R side during swing on L, resulting in L lateral lean in gait. Provided multi-modal cues including visual, verbal, and tactile throughout gait cycle to normalize gait pattern.   Next, continued gait training with Ethelene Hal. He ambulated ~132ft with Ethelene Hal and +2 assist for managing the walker with minA needed for similar deficits as described above. His gait begins to deteriorate after ~56ft and requires more facilitation for upright and L hip stability.   Introduced IT trainer to work on Lexicographer, sequencing, LLE strengthening, and functional mobility as pt has 3 STE home. He completed x4 6inch steps a total of 3 sets with seated rest breaks b/w each effort. He required +2 min to modA for navigating steps via  reciprocal stepping during ascent and step-to stepping during descent. He initially required mod to max cues for sequencing but during 3rd set, this faded to min cues.   Pt transported back to his room in w/c at completion of session, remained seated in w/c per his request. Safety belt alarm on and all his needs within reach. Pt pleased with his continued progress in rehab.   Therapy Documentation Precautions:  Precautions Precautions: Fall Precaution Comments: L hemi, L inattention, HOH Restrictions Weight Bearing Restrictions: No General:    Therapy/Group: Individual Therapy  [name] 08/11/2021, 7:29 AM

## 2021-08-11 NOTE — Progress Notes (Signed)
Occupational Therapy Session Note  Patient Details  Name: Barry [name] MRN: [id number] Date of Birth: 30-Apr-1933  Today's Date: 08/11/2021 OT Individual Time: 4034-7425 OT Individual Time Calculation (min): 45 min    Short Term Goals: Week 2:  OT Short Term Goal 1 (Week 2): Patient will complete sit to stand transfers with Mod A and LRAD. OT Short Term Goal 2 (Week 2): Patient with complete toilet transfers with Mod A and LRAD. OT Short Term Goal 3 (Week 2): Patient will complete LB dressing with Mod A in sitting/standing with hemi technique and LRAD. OT Short Term Goal 4 (Week 2): Patient will complete 2/3 parts of toileting task with Mod A in sitting/standing with good safety awareness and LRAD.  Skilled Therapeutic Interventions/Progress Updates:    Patient in bed, alert and ready for therapy session.  He denies pain and states that he would like to wash up first.  Able to complete hygiene with set up, max A to remove brief and donn clean brief at bed level.  Rolling right Min A, rolling left CS. Side lying to sitting edge of bed with mod A.  Sit pivot to w/c mod A.  Complete upper body bathing and dressing seated at sink with set up with exception of buttons dependent.  Oral care/grooming tasks with CS, min cues to locate needed items on left side.  Lower body bathing with min a, donns pants w/c level with mod A.  Sit to stand at sink with min A, able to pull pants over hips with min A.  Cues for balance and position in space.  Able to fasten button, zipper and belt in seated position.  Donns socks mod A, sneakers max A, dependent to tie.   Utilized BITS system for coordination and visual motor challenge in seated position - dots with right hand 1.48 sec, with left hand 3.51 sec He remained seated in w/c at close of session , seat belt alarm set and call bell in hand.    Therapy Documentation Precautions:  Precautions Precautions: Fall Precaution Comments: L hemi, L inattention,  HOH Restrictions Weight Bearing Restrictions: No   Therapy/Group: Individual Therapy  Carlos Levering 08/11/2021, 7:25 AM

## 2021-08-11 NOTE — Progress Notes (Signed)
Occupational Therapy Session Note  Patient Details  Name: Barry [name] MRN: [id number] Date of Birth: [birth year]  Today's Date: 08/11/2021 OT Individual Time: 1110-1205 OT Individual Time Calculation (min): 55 min    Short Term Goals: Week 2:  OT Short Term Goal 1 (Week 2): Patient will complete sit to stand transfers with Mod A and LRAD. OT Short Term Goal 2 (Week 2): Patient with complete toilet transfers with Mod A and LRAD. OT Short Term Goal 3 (Week 2): Patient will complete LB dressing with Mod A in sitting/standing with hemi technique and LRAD. OT Short Term Goal 4 (Week 2): Patient will complete 2/3 parts of toileting task with Mod A in sitting/standing with good safety awareness and LRAD.  Skilled Therapeutic Interventions/Progress Updates:    Pt sitting up in w/c reports he already washed up today, no c/o pain.  Transported pt to gym for time management.  Stand pivot using RW w/c to EOM needing max multimodal cues for sequencing of steps and RW mgt with fair carryover and also needing mod assist to prevent left leaning and maintain upright balance.  Neuro re-ed with use of mirror, tactile cues, and external targets in standing and sitting to improve midline postural awareness and control.  Pt participated in seated dynamic forward lean task using weighted small medicine ball to tap target and return to upright.  Provided verbal cues including push out your chest and shoulders back to decreased kyphotic posture and reduce posterior pelvic tilt.  Pt completed stand pivot towards non affected side using RW with visual demonstration provided for sequencing prior to completing. Pt needed min assist this time with improved sequencing exhibited EOM to w/c.  Returned to room, call bell in reach, seat alarm on.  Therapy Documentation Precautions:  Precautions Precautions: Fall Precaution Comments: L hemi, L inattention, HOH Restrictions Weight Bearing Restrictions:  No   Therapy/Group: Individual Therapy  [name] 08/11/2021, 1:43 PM

## 2021-08-11 NOTE — Progress Notes (Signed)
Speech Language Pathology Daily Session Note  Patient Details  Name: Barry [name] MRN: [id number] Date of Birth: [birth year]  Today's Date: 08/11/2021 SLP Individual Time: 1515-1600 SLP Individual Time Calculation (min): 45 min  Short Term Goals: Week 2: SLP Short Term Goal 1 (Week 2): Patient will attend to left visual field/body during functional tasks with supervision level A. SLP Short Term Goal 2 (Week 2): Patient will perform familiar basic and mildly complex problem solving tasks with minA verbal cues. SLP Short Term Goal 3 (Week 2): Patient will self-monitor and correct errors in functional problem solving tasks with minA verbal, visual cues. SLP Short Term Goal 4 (Week 2): Patient will use external memory aides to recall recent events, orient to time with minA verbal cues to utilize.  Skilled Therapeutic Interventions: Pt seen for skilled ST with focus on cognitive goals. Pt apologetic for previous tx session, stating "I thought you were testing my brain to see if I was crazy and I didn't want my lady friend to see how I was doing". Re-educated on role of SLP, goals of tx, cognitive strengths and remaining deficits. SLP facilitating simple problem ID and solution generating task by providing min A verbal cues to increase details in responses and increase awareness of physical deficits at this time. Pt often stating "well I can handle that" in regards to physically demanding home management situations (changing light bulbs, putting out fires, etc), resistant to education on utilizing son or hired help for these situations at discharge. Pt left in wheelchair with alarm belt present and all needs within reach. Cont ST POC.     Pain Pain Assessment Pain Scale: 0-10 Pain Score: 0-No pain  Therapy/Group: Individual Therapy  [name] 08/11/2021, 3:46 PM

## 2021-08-11 NOTE — Progress Notes (Signed)
[facility] PHYSICAL MEDICINE & REHABILITATION PROGRESS NOTE  Subjective/Complaints: No new complaints Asks about his diet and CBGs. Says his current diet is not as healthy as how he eats at home where he tries to avoid sugar   ROS: Denies CP, SOB, N/V/D, +right great toe pain- resolved on its own, + low back pain, +lower extremity weakness- improving  Objective: Vital Signs: Blood pressure (!) 160/71, pulse 66, temperature 98.2 F (36.8 C), temperature source Oral, resp. rate 18, height 5\' 11"  (1.803 m), weight 74.2 kg, SpO2 99 %. No results found. No results for input(s): WBC, HGB, HCT, PLT in the last 72 hours.  Recent Labs    08/11/21 0511  NA 134*  K 4.0  CL 103  CO2 25  GLUCOSE 134*  BUN 25*  CREATININE 1.13  CALCIUM 8.7*     Intake/Output Summary (Last 24 hours) at 08/11/2021 1042 Last data filed at 08/11/2021 7026 Gross per 24 hour  Intake 660 ml  Output 1525 ml  Net -865 ml        Physical Exam: BP (!) 160/71 (BP Location: Left Arm)   Pulse 66   Temp 98.2 F (36.8 C) (Oral)   Resp 18   Ht 5\' 11"  (1.803 m)   Wt 74.2 kg   SpO2 99%   BMI 22.81 kg/m  Gen: no distress, normal appearing HEENT: oral mucosa pink and moist, NCAT Cardio: Reg rate Chest: normal effort, normal rate of breathing Abd: soft, non-distended Ext: no edema Psych: pleasant, normal affect Skin: intact Neuro: Makes eye contact with examiner.   Follows commands.   Name and age.   He does have some mild delay in processing. Difficulty with medication management.  Dysarthria, unchanged Motor: RUE/RLE: 4/5 proximal distal LUE: 4-/5 proximal distal LLE: 3/5 proximal 2/5 distal,  Dysmetria LUE and LLE  Assessment/Plan: 1. Functional deficits which require 3+ hours per day of interdisciplinary therapy in a comprehensive inpatient rehab setting. Physiatrist is providing close team supervision and 24 hour management of active medical problems listed below. Physiatrist and rehab team  continue to assess barriers to discharge/monitor patient progress toward functional and medical goals   Care Tool:  Bathing    Body parts bathed by patient: Right arm, Left arm, Chest, Abdomen, Front perineal area, Right upper leg, Left upper leg, Face   Body parts bathed by helper: Buttocks, Right lower leg, Left lower leg     Bathing assist Assist Level: Moderate Assistance - Patient 50 - 74%     Upper Body Dressing/Undressing Upper body dressing   What is the patient wearing?: Pull over shirt    Upper body assist Assist Level: Minimal Assistance - Patient > 75%    Lower Body Dressing/Undressing Lower body dressing      What is the patient wearing?: Pants     Lower body assist Assist for lower body dressing: Maximal Assistance - Patient 25 - 49%     Toileting Toileting    Toileting assist Assist for toileting: Maximal Assistance - Patient 25 - 49%     Transfers Chair/bed transfer  Transfers assist     Chair/bed transfer assist level: 2 Helpers     Locomotion Ambulation   Ambulation assist   Ambulation activity did not occur: Safety/medical concerns  Assist level: 2 helpers Assistive device: Lite Gait Max distance: 129ft   Walk 10 feet activity   Assist  Walk 10 feet activity did not occur: Safety/medical concerns        Walk  50 feet activity   Assist Walk 50 feet with 2 turns activity did not occur: Safety/medical concerns         Walk 150 feet activity   Assist Walk 150 feet activity did not occur: Safety/medical concerns         Walk 10 feet on uneven surface  activity   Assist Walk 10 feet on uneven surfaces activity did not occur: Safety/medical concerns         Wheelchair     Assist Is the patient using a wheelchair?: Yes Type of Wheelchair: Manual    Wheelchair assist level: Supervision/Verbal cueing Max wheelchair distance: 17ft    Wheelchair 50 feet with 2 turns activity    Assist         Assist Level: Dependent - Patient 0%   Wheelchair 150 feet activity     Assist      Assist Level: Dependent - Patient 0%    Medical Problem List and Plan: 1.  Left-sided hemiparesis secondary to infarct right posterior lentiform nucleus, thalamus, and tail of the caudate with additional punctate infarct in the lateral left thalamus.   Continue CIR ELOS 25 days; encouraged regarding his excellent progress and improvements in his strength! 2.  Antithrombotics: -DVT/anticoagulation:  Pharmaceutical: Continue Heparin             -antiplatelet therapy: Aspirin 81 mg daily and Plavix 75 mg daily until 10/30, then Plavix alone. 3. Low back pain: Add kpad. Continue Lyrica 50 mg 3 times daily, Decrease hydrocodone to 1 tablet daily PRN.               Monitor with increased exertion 4. Anxiety: Continue Klonopin 0.25 mg 3 times daily, Prozac 20 mg daily             -antipsychotic agents: N/A 5. Neuropsych: This patient is capable of making decisions on his own behalf. 6. Skin/Wound Care: Routine skin checks 7. Fluids/Electrolytes/Nutrition: Routine in and outs             CMP ordered for tomorrow 8.  Hypertension.  Norvasc 5 mg daily, Toprol-XL 25 mg daily.    Blood pressure is labile, but elevated, as needed hydralazine added on 10/16  Increase Norvasc to 10mg .   Increase hydralazine to 100 mg TID- monitor effect            Monitor with increased mobility 9.  Hyperlipidemia.  Zocor/Zetia 10.  COPD with history of tobacco use.  Continue nebulizers as directed as well as Singulair.  Check oxygen saturations every shift 11.  Diabetes mellitus with hyperglycemia.  Hemoglobin A1c 6.7.  Continue Amaryl 0.5 mg twice daily  Elevated on 10/16, will consider medication adjustments if persistent  Provide dietary education- recommended avoiding added sugar in drinks and meals             Monitor with increased mobility 12.  Right side parotid mass.  Follow-up outpatient ENT 13.  CKD stage  III.  Baseline creatinine 1.1-1.3.               Cr up to 1.41- place nursing order to encourage 6-8 glasses of water per day and creatinine has normalized to 1.10, monitor weekly.  Repeat in am  14.  GERD.  Continue Protonix 15. Right breat toe bunion: requested ortho tech eval for orthotic. Discussed benefits of icing.  15. Bradycardia: decrease lopressor to 12.5mg  Vitals:   08/10/21 1922 08/11/21 0606  BP: (!) 156/65 (!) 160/71  Pulse: Marland Kitchen)  54 66  Resp: 18 18  Temp: 97.9 F (36.6 C) 98.2 F (36.8 C)  SpO2: 100% 99%      LOS: 11 days A FACE TO FACE EVALUATION WAS PERFORMED  Barry [name] 08/11/2021, 10:42 AM

## 2021-08-12 LAB — GLUCOSE, CAPILLARY
Glucose-Capillary: 119 mg/dL — ABNORMAL HIGH (ref 70–99)
Glucose-Capillary: 141 mg/dL — ABNORMAL HIGH (ref 70–99)
Glucose-Capillary: 152 mg/dL — ABNORMAL HIGH (ref 70–99)
Glucose-Capillary: 116 mg/dL — ABNORMAL HIGH (ref 70–99)

## 2021-08-12 MED ORDER — INFLUENZA VAC A&B SA ADJ QUAD 0.5 ML IM PRSY
0.5000 mL | PREFILLED_SYRINGE | Freq: Once | INTRAMUSCULAR | Status: AC
  Administered 2021-08-12: 0.5 mL via INTRAMUSCULAR
  Filled 2021-08-12: qty 0.5

## 2021-08-12 MED ORDER — CLONAZEPAM 0.25 MG PO TBDP
0.2500 mg | ORAL_TABLET | Freq: Three times a day (TID) | ORAL | Status: DC
  Administered 2021-08-12 – 2021-08-14 (×6): 0.25 mg via ORAL
  Filled 2021-08-12 (×6): qty 1

## 2021-08-12 NOTE — Progress Notes (Signed)
Patient ID: Barry [name], male   DOB: February 06, 1933, 87 y.o.   MRN: [id number]  Met with pt and spoke with son via telephone to discuss team conference the progress pt has made this week and the plan still for discharge on 11/8. Discussed having son come in and observe dad in therapy sessions to see his progress and what care he will require at discharge. Son is in agreement with this and have scheduled him for next Tuesday 11/1 from 1:00-3:00 pm. Will make therapy team aware and will work on discharge needs. Son has a private duty list to begin pursuing hired assist while he and his wife work during the day. Son received the Advanced Directive packet left in room for him.

## 2021-08-12 NOTE — Patient Care Conference (Signed)
Inpatient RehabilitationTeam Conference and Plan of Care Update Date: 08/12/2021   Time: 11:08 AM    Patient Name: Barry [name] [facility]      Medical Record Number: [id number]  Date of Birth: 05/25/1933 Sex: Male         Room/Bed: 4M01C/4M01C-01 Payor Info: Payor: MEDICARE / Plan: MEDICARE PART A AND B / Product Type: *No Product type* /    Admit Date/Time:  07/31/2021 12:34 PM  Primary Diagnosis:  Right thalamic infarction [facility])  Hospital Problems: Principal Problem:   Right thalamic infarction [facility])    Expected Discharge Date: Expected Discharge Date: 08/25/21  Team Members Present: Physician leading conference: Dr. Leeroy Cha Social Worker Present: Ovidio Kin, LCSW Nurse Present: Dorien Chihuahua, RN PT Present: Ginnie Smart, PT OT Present: Leretha Pol, OT SLP Present: Nadara Mode, SLP PPS Coordinator present : Ileana Ladd, PT     Current Status/Progress Goal Weekly Team Focus  Bowel/Bladder   pt continent to bowel and bladder LBM 10/25    Continent   toileting  Swallow/Nutrition/ Hydration             ADL's   UB self care min-setup; LB self care min-mod assist; functional transfers min-mod A  minA; may upgrade goals if progress consistent  functional transfers, postural control, neuro re-ed, self care retraining   Mobility   modA bed mobility, modA squat<>pivot transfers, min to modA sit<>stand transfers, Gait ~132ft with Ethelene Hal with +2 assist. 119ft gait with LiteGait overground and partial BWS. L inattention  minA overall  LLE NMR, functional transfers, bed mobility, gait training with LRAD, safety awareness   Communication             Safety/Cognition/ Behavioral Observations            Pain   denies pain         Skin   skin intact external heart monitor to left chest  skin to remain intact        Discharge Planning:  Also interested in Advanced Directives left in pt's room for son   Team Discussion: Patient worried about weakness and  recovery. BP controlled, HR better. Zio patch monitor through end of the month. MD weaning off of medications for anxiety.  Progress limited by left inattention, balance and hip abductor tone with right thumb pain.  Patient on target to meet rehab goals: Yes, currently min - mod assist for lower body care. Has trouble with balance and leans left. Working on engaging left lower extremity to stand. Making gains but will need 24/7 supervision at discharge and questionable functional ambulator at discharge.   *See Care Plan and progress notes for long and short-term goals.   Revisions to Treatment Plan:  Neuromuscular re-education [place] walker practice Medication management education; patient set in ways but will need assistance to manage medications Spica brace/custom brace for right thumb Working on dynamic standing balance  Teaching Needs: Safety, medications, secondary risk management, dietary modifications, transfers, toileting, etc.  Current Barriers to Discharge: Home enviroment access/layout and Lack of/limited family support  Possible Resolutions to Barriers: Family education with son and daughter Private duty list for hired help when son not available Recommend ramp for entry     Medical Summary Current Status: blood pressure much better controlled, denies pain or anxiety, concerned by how much medication he takes, Zio patch in place  Barriers to Discharge: Medical stability  Barriers to Discharge Comments: hypertension, type 2 DM, polypharmacy, bradycardia Possible Resolutions to [employer] Focus: have  uptitrated his BP medications and reduced HR medications, provided dietary counseling, weaning his pain and anxiety medications daily   Continued Need for Acute Rehabilitation Level of Care: The patient requires daily medical management by a physician with specialized training in physical medicine and rehabilitation for the following reasons: Direction of a multidisciplinary  physical rehabilitation program to maximize functional independence : Yes Medical management of patient stability for increased activity during participation in an intensive rehabilitation regime.: Yes Analysis of laboratory values and/or radiology reports with any subsequent need for medication adjustment and/or medical intervention. : Yes   I attest that I was present, lead the team conference, and concur with the assessment and plan of the team.   Dorien Chihuahua B 08/12/2021, 4:39 PM

## 2021-08-12 NOTE — Progress Notes (Signed)
Physical Therapy Session Note  Patient Details  Name: Barry [name] MRN: [id number] Date of Birth: Jan 21, 1933  Today's Date: 08/12/2021 PT Individual Time: 1130-1158 PT Individual Time Calculation (min): 28 min   Short Term Goals: Week 2:  PT Short Term Goal 1 (Week 2): Pt will complete bed mobility with minA PT Short Term Goal 2 (Week 2): Pt will complete bed<>chair transfers with minA and LRAD PT Short Term Goal 3 (Week 2): Pt will ambulate 84ft with minA and LRAD  Skilled Therapeutic Interventions/Progress Updates:     Pt seen seated in w/c at start of session and agreeable to PT treatment. No reports of pain during session. Removed hospital socks and donned regular socks and tennis shoes with totalA for time. Transported to [employer] and retrieved EVA walker. Sit<>stand to Ethelene Hal with minA with cues needed for correct hand placement to push from arm chair rather than pulling up to stand. He ambulated 2x142ft with modA and +2 assist for driving/steering walker with modA needed for facilitating upright postural control and gait mechanics - he needed max verbal cues for L foot awareness and keeping L foot abducted due to narrow BOS and occasional scissoring. Also provided visual cues of tile flooring to assist with L foot placement. Pt transported back to his room at end of session and remained seated in w/c with safety belt alarm on and all needs in reach.   Therapy Documentation Precautions:  Precautions Precautions: Fall Precaution Comments: L hemi, L inattention, HOH Restrictions Weight Bearing Restrictions: No General:     Therapy/Group: Individual Therapy  Elius Etheredge P Woodward Klem 08/12/2021, 7:25 AM

## 2021-08-12 NOTE — Progress Notes (Signed)
Occupational Therapy Session Note  Patient Details  Name: Barry [name] MRN: [id number] Date of Birth: [birth year]  Today's Date: 08/12/2021 OT Individual Time: 1005-1100 OT Individual Time Calculation (min): 55 min    Short Term Goals: Week 2:  OT Short Term Goal 1 (Week 2): Patient will complete sit to stand transfers with Mod A and LRAD. OT Short Term Goal 2 (Week 2): Patient with complete toilet transfers with Mod A and LRAD. OT Short Term Goal 3 (Week 2): Patient will complete LB dressing with Mod A in sitting/standing with hemi technique and LRAD. OT Short Term Goal 4 (Week 2): Patient will complete 2/3 parts of toileting task with Mod A in sitting/standing with good safety awareness and LRAD.  Skilled Therapeutic Interventions/Progress Updates:    Pt sitting up in w/c, no c/o pain this session.  Receptive to going to gym to focus on blocked practice transfers, standing balance, postural control, and LUE neuro re-ed.  Pt transported to large gym.  Stand pivot completed to right and left w/c<>EOM x 4 repetitions.  Pt required max step by step cues for hand placement, foot placement, weight shifting, stepping sequence, and RW management.  Pt initially leaning to left and posteriorly however exhibited improved ability to correct posture today with visual cues provided in mirror, and tactile cues to left quad to increase muscle recruitment and attention to LLE.  Pt required mod assist for sit<>stand, mod assist for stand pivot, and mod fading to min assist static standing.  Pt then completed dynamic standing activity with facilitation of right weight shift and lean and incoorporating functional reach first RUE then upgrading to RUE pass to LUE and place on target (using card match board).  When pt dividing attention and reaching with LUE, left lean exaggerates and requires mod assist to maintain balance.  Returned to room, call bell in reach, seat alarm on.  Therapy  Documentation Precautions:  Precautions Precautions: Fall Precaution Comments: L hemi, L inattention, HOH Restrictions Weight Bearing Restrictions: No    Therapy/Group: Individual Therapy  [name] 08/12/2021, 2:23 PM

## 2021-08-12 NOTE — Progress Notes (Signed)
[facility] PHYSICAL MEDICINE & REHABILITATION PROGRESS NOTE  Subjective/Complaints: No new complaints Denies pain and anxiety, discussed we will wean these medications- he is happy as feels he is taking too many medications   ROS: Denies CP, SOB, N/V/D, +right great toe pain- resolved on its own, + low back pain- resolved currently, +lower extremity weakness- improving  Objective: Vital Signs: Blood pressure (!) 148/65, pulse 66, temperature 98 F (36.7 C), temperature source Oral, resp. rate 16, height 5\' 11"  (1.803 m), weight 74.2 kg, SpO2 95 %. No results found. No results for input(s): WBC, HGB, HCT, PLT in the last 72 hours.  Recent Labs    08/11/21 0511  NA 134*  K 4.0  CL 103  CO2 25  GLUCOSE 134*  BUN 25*  CREATININE 1.13  CALCIUM 8.7*     Intake/Output Summary (Last 24 hours) at 08/12/2021 0943 Last data filed at 08/12/2021 0731 Gross per 24 hour  Intake 720 ml  Output 1100 ml  Net -380 ml        Physical Exam: BP (!) 148/65 (BP Location: Left Arm)   Pulse 66   Temp 98 F (36.7 C) (Oral)   Resp 16   Ht 5\' 11"  (1.803 m)   Wt 74.2 kg   SpO2 95%   BMI 22.81 kg/m  Gen: no distress, normal appearing HEENT: oral mucosa pink and moist, NCAT Cardio: Reg rate Chest: normal effort, normal rate of breathing Abd: soft, non-distended Ext: no edema Psych: pleasant, normal affect Skin: intact  Neuro: Makes eye contact with examiner.   Follows commands.   Name and age.   He does have some mild delay in processing. Difficulty with medication management.  Dysarthria, unchanged Motor: RUE/RLE: 4/5 proximal distal LUE: 4-/5 proximal distal LLE: 3/5 proximal 2/5 distal,  Dysmetria LUE and LLE  Assessment/Plan: 1. Functional deficits which require 3+ hours per day of interdisciplinary therapy in a comprehensive inpatient rehab setting. Physiatrist is providing close team supervision and 24 hour management of active medical problems listed  below. Physiatrist and rehab team continue to assess barriers to discharge/monitor patient progress toward functional and medical goals   Care Tool:  Bathing    Body parts bathed by patient: Right arm, Left arm, Chest, Abdomen, Front perineal area, Right upper leg, Left upper leg, Face   Body parts bathed by helper: Buttocks, Right lower leg, Left lower leg     Bathing assist Assist Level: Moderate Assistance - Patient 50 - 74%     Upper Body Dressing/Undressing Upper body dressing   What is the patient wearing?: Pull over shirt    Upper body assist Assist Level: Minimal Assistance - Patient > 75%    Lower Body Dressing/Undressing Lower body dressing      What is the patient wearing?: Pants     Lower body assist Assist for lower body dressing: Maximal Assistance - Patient 25 - 49%     Toileting Toileting    Toileting assist Assist for toileting: Maximal Assistance - Patient 25 - 49%     Transfers Chair/bed transfer  Transfers assist     Chair/bed transfer assist level: 2 Helpers     Locomotion Ambulation   Ambulation assist   Ambulation activity did not occur: Safety/medical concerns  Assist level: 2 helpers Assistive device: Lite Gait Max distance: 131ft   Walk 10 feet activity   Assist  Walk 10 feet activity did not occur: Safety/medical concerns        Walk 50 feet  activity   Assist Walk 50 feet with 2 turns activity did not occur: Safety/medical concerns         Walk 150 feet activity   Assist Walk 150 feet activity did not occur: Safety/medical concerns         Walk 10 feet on uneven surface  activity   Assist Walk 10 feet on uneven surfaces activity did not occur: Safety/medical concerns         Wheelchair     Assist Is the patient using a wheelchair?: Yes Type of Wheelchair: Manual    Wheelchair assist level: Supervision/Verbal cueing Max wheelchair distance: 40ft    Wheelchair 50 feet with 2 turns  activity    Assist        Assist Level: Dependent - Patient 0%   Wheelchair 150 feet activity     Assist      Assist Level: Dependent - Patient 0%    Medical Problem List and Plan: 1.  Left-sided hemiparesis secondary to infarct right posterior lentiform nucleus, thalamus, and tail of the caudate with additional punctate infarct in the lateral left thalamus.   Continue CIR ELOS 25 days; encouraged regarding his excellent progress and improvements in his strength! -Interdisciplinary Team Conference today   2.  Impaired mobility: -DVT/anticoagulation:  Pharmaceutical: Continue Heparin             -antiplatelet therapy: Aspirin 81 mg daily and Plavix 75 mg daily until 10/30, then Plavix alone. 3. Low back pain: Add kpad. Continue Lyrica 50 mg 3 times daily, discontinue hydrocodone             Monitor with increased exertion 4. Anxiety: Decrease Klonopin 0.25 mg to TID times daily, Prozac 20 mg daily             -antipsychotic agents: N/A 5. Neuropsych: This patient is capable of making decisions on his own behalf. 6. Skin/Wound Care: Routine skin checks 7. Fluids/Electrolytes/Nutrition: Routine in and outs 8.  Hypertension.  Norvasc 5 mg daily, Toprol-XL 25 mg daily.    Blood pressure is labile, but elevated, as needed hydralazine added on 10/16  Increase Norvasc to 10mg .   Increase hydralazine to 100 mg TID- monitor effect            Monitor with increased mobility 9.  Hyperlipidemia.  Zocor/Zetia 10.  COPD with history of tobacco use.  Continue nebulizers as directed as well as Singulair.  Check oxygen saturations every shift 11.  Diabetes mellitus with hyperglycemia.  Hemoglobin A1c 6.7.  Continue Amaryl 0.5 mg twice daily  Elevated on 10/16, will consider medication adjustments if persistent  Provide dietary education- recommended avoiding added sugar in drinks and meals             Monitor with increased mobility 12.  Right side parotid mass.  Follow-up outpatient  ENT 13.  CKD stage III.  Baseline creatinine 1.1-1.3.               Cr up to 1.41- place nursing order to encourage 6-8 glasses of water per day and creatinine has normalized to 1.10, monitor weekly.  Repeat in am  14.  GERD.  Continue Protonix 15. Right breat toe bunion: requested ortho tech eval for orthotic. Discussed benefits of icing.  15. Bradycardia: decrease lopressor to 12.5mg  Vitals:   08/11/21 1953 08/12/21 0608  BP: 127/64 (!) 148/65  Pulse: 74 66  Resp: 18 16  Temp: 98.6 F (37 C) 98 F (36.7 C)  SpO2: 99%  95%      LOS: 12 days A FACE TO FACE EVALUATION WAS PERFORMED  Clide Deutscher Johngabriel Verde 08/12/2021, 9:43 AM

## 2021-08-12 NOTE — Progress Notes (Signed)
Physical Therapy Session Note  Patient Details  Name: Barry [name] MRN: [id number] Date of Birth: [birth year]  Today's Date: 08/12/2021 PT Individual Time: 4098-1191 PT Individual Time Calculation (min): 58 min   Short Term Goals: Week 2:  PT Short Term Goal 1 (Week 2): Pt will complete bed mobility with minA PT Short Term Goal 2 (Week 2): Pt will complete bed<>chair transfers with minA and LRAD PT Short Term Goal 3 (Week 2): Pt will ambulate 74ft with minA and LRAD  Skilled Therapeutic Interventions/Progress Updates:     Pt seen sitting in w/c to start session - agreeable to PT treatment. Denies any pain. Transported him outside in w/c to the reflection fountain - discussed biopsychosocial factors related to stroke recovery and hopeful prognosis. Pt was very receptive and appreciative of this. Pt returned upstairs to rehab floor - completed squat<>pivot transfer with modA from w/c to mat table. Worked on standing toe-taps with stronger R foot and RUE support to back of arm chair with minA for steadying - emphasizing stance control for L knee stability. Next, did alternating step ups with L foot in similar setup with minA as well. Provided visual target for L foot due to narrow placement of foot - responded well to visual target. Next, progressed to full step-ups with L foot leading to target concentric/eccentric powering to rise - he required minA for this as well without evidence of true knee buckling but more hip instability. Completed several repetitions of this, 3x20 total. Assisted back to his w/c via stand<>pivot transfer with minA for facilitating lateral weight shift and mod cues for stepping sequencing.    Pt transported back to his room and he requested to remain seated in w/c at end of session. Safety belt alarm on, all needs in reach.  Therapy Documentation Precautions:  Precautions Precautions: Fall Precaution Comments: L hemi, L inattention, HOH Restrictions Weight  Bearing Restrictions: No General:    Therapy/Group: Individual Therapy  [name] 08/12/2021, 2:55 PM

## 2021-08-12 NOTE — Progress Notes (Signed)
Speech Language Pathology Daily Session Note  Patient Details  Name: Barry [name] MRN: [id number] Date of Birth: 17-Jun-1933  Today's Date: 08/12/2021 SLP Individual Time: 0900-1000 SLP Individual Time Calculation (min): 60 min  Short Term Goals: Week 2: SLP Short Term Goal 1 (Week 2): Patient will attend to left visual field/body during functional tasks with supervision level A. SLP Short Term Goal 2 (Week 2): Patient will perform familiar basic and mildly complex problem solving tasks with minA verbal cues. SLP Short Term Goal 3 (Week 2): Patient will self-monitor and correct errors in functional problem solving tasks with minA verbal, visual cues. SLP Short Term Goal 4 (Week 2): Patient will use external memory aides to recall recent events, orient to time with minA verbal cues to utilize.  Skilled Therapeutic Interventions:   Patient seen for skilled ST session focusing on cognitive function goals. He was sitting in recliner when SLP arrived and was agreeable to session. When SLP reviewing stroke education packet with patient, he was receptive to learning and did then report that his right thumb has been stiff and painful past approximately 2 months and his left shoulder was already sore prior to CVA from what he described as an injury from pulling it while reaching to far when fixing his lawn mower about a month prior to CVA. Patient did not demonstrate awareness that his stroke was on right side of his brain. When SLP introduced a cognitive puzzle task, patient initially questioning reasoning for task and having difficulty understanding and following directions, but as task progressed, SLP able to decrease amount of cues from [place] to [place]. He did not demonstrate awareness to the few errors he did make. Patient left in recliner with safety alarm belt in place and on. He continues to benefit from skilled SLP to maximize cognitive function prior to discharge.  Pain Pain Assessment Pain  Scale: 0-10 Pain Score: 0-No pain  Therapy/Group: Individual Therapy  Sonia Baller, MA, CCC-SLP Speech Therapy

## 2021-08-13 LAB — GLUCOSE, CAPILLARY
Glucose-Capillary: 128 mg/dL — ABNORMAL HIGH (ref 70–99)
Glucose-Capillary: 115 mg/dL — ABNORMAL HIGH (ref 70–99)
Glucose-Capillary: 163 mg/dL — ABNORMAL HIGH (ref 70–99)
Glucose-Capillary: 96 mg/dL (ref 70–99)

## 2021-08-13 MED ORDER — PREGABALIN 25 MG PO CAPS
25.0000 mg | ORAL_CAPSULE | Freq: Three times a day (TID) | ORAL | Status: DC
  Administered 2021-08-13 – 2021-08-14 (×3): 25 mg via ORAL
  Filled 2021-08-13 (×3): qty 1

## 2021-08-13 NOTE — Progress Notes (Signed)
Occupational Therapy Note  Patient Details  Name: Barry [name] MRN: [id number] Date of Birth: 06-16-1933   Pt reporting not feeling well this am with headache and generalized fatigue.  He states that he received his flu shot yesterday and wonders if this may be the cause.  Declined OOB activity with nursing made aware.  Pt missed 30 mins of skilled therapyl    Klaira Pesci OTR/L 08/13/2021, 11:43 AM

## 2021-08-13 NOTE — Progress Notes (Signed)
Occupational Therapy Note  Patient Details  Name: Barry [name] MRN: [id number] Date of Birth: 07-Feb-1933  Today's Date: 08/13/2021 OT Missed Time: 63 Minutes Missed Time Reason: Patient ill (comment)  Pt semi reclined in bed, reporting that he doesn't feel so good today and has a 5/10 headache.  Vitals assessed and noted BP 145/79, pulse 68 ; WFL.  Nurse made aware.  Pt politely requesting to defer OT session. Missed 60 minutes of treatment.   Caryl Asp Theresea Trautmann 08/13/2021, 1:15 PM

## 2021-08-13 NOTE — Progress Notes (Signed)
[facility] PHYSICAL MEDICINE & REHABILITATION PROGRESS NOTE  Subjective/Complaints: Received flu shot yesterday without any adverse effects BP much better controlled but SBP a little high HR much better controlled  ROS: Denies CP, SOB, N/V/D, +right great toe pain- resolved on its own, + low back pain- resolved currently, +lower extremity weakness- improving  Objective: Vital Signs: Blood pressure (!) 147/71, pulse 79, temperature 98.6 F (37 C), temperature source Oral, resp. rate 16, height 5\' 11"  (1.803 m), weight 74.2 kg, SpO2 98 %. No results found. No results for input(s): WBC, HGB, HCT, PLT in the last 72 hours.  Recent Labs    08/11/21 0511  NA 134*  K 4.0  CL 103  CO2 25  GLUCOSE 134*  BUN 25*  CREATININE 1.13  CALCIUM 8.7*     Intake/Output Summary (Last 24 hours) at 08/13/2021 1023 Last data filed at 08/13/2021 0825 Gross per 24 hour  Intake 660 ml  Output 1800 ml  Net -1140 ml        Physical Exam: BP (!) 147/71 (BP Location: Left Arm)   Pulse 79   Temp 98.6 F (37 C) (Oral)   Resp 16   Ht 5\' 11"  (1.803 m)   Wt 74.2 kg   SpO2 98%   BMI 22.81 kg/m  Gen: no distress, normal appearing HEENT: oral mucosa pink and moist, NCAT Cardio: Reg rate Chest: normal effort, normal rate of breathing Abd: soft, non-distended Ext: no edema Psych: pleasant, normal affect Skin: intact Neuro: Makes eye contact with examiner.   Follows commands.   Name and age.   He does have some mild delay in processing. Difficulty with medication management.  Dysarthria, unchanged Motor: RUE/RLE: 4/5 proximal distal LUE: 4-/5 proximal distal LLE: 3/5 proximal 2/5 distal,  Dysmetria LUE and LLE  Assessment/Plan: 1. Functional deficits which require 3+ hours per day of interdisciplinary therapy in a comprehensive inpatient rehab setting. Physiatrist is providing close team supervision and 24 hour management of active medical problems listed below. Physiatrist and rehab  team continue to assess barriers to discharge/monitor patient progress toward functional and medical goals   Care Tool:  Bathing    Body parts bathed by patient: Right arm, Left arm, Chest, Abdomen, Front perineal area, Right upper leg, Left upper leg, Face   Body parts bathed by helper: Buttocks, Right lower leg, Left lower leg     Bathing assist Assist Level: Moderate Assistance - Patient 50 - 74%     Upper Body Dressing/Undressing Upper body dressing   What is the patient wearing?: Pull over shirt    Upper body assist Assist Level: Minimal Assistance - Patient > 75%    Lower Body Dressing/Undressing Lower body dressing      What is the patient wearing?: Pants     Lower body assist Assist for lower body dressing: Maximal Assistance - Patient 25 - 49%     Toileting Toileting    Toileting assist Assist for toileting: Maximal Assistance - Patient 25 - 49%     Transfers Chair/bed transfer  Transfers assist     Chair/bed transfer assist level: Moderate Assistance - Patient 50 - 74%     Locomotion Ambulation   Ambulation assist   Ambulation activity did not occur: Safety/medical concerns  Assist level: 2 helpers Assistive device: Ethelene Hal Max distance: 170ft   Walk 10 feet activity   Assist  Walk 10 feet activity did not occur: Safety/medical concerns  Assist level: 2 helpers Assistive device: [employer]  50 feet activity   Assist Walk 50 feet with 2 turns activity did not occur: Safety/medical concerns  Assist level: 2 helpers Assistive device: Walker-Eva    Walk 150 feet activity   Assist Walk 150 feet activity did not occur: Safety/medical concerns  Assist level: 2 helpers Assistive device: Walker-Eva    Walk 10 feet on uneven surface  activity   Assist Walk 10 feet on uneven surfaces activity did not occur: Safety/medical concerns         Wheelchair     Assist Is the patient using a wheelchair?: Yes Type of  Wheelchair: Manual    Wheelchair assist level: Supervision/Verbal cueing Max wheelchair distance: 73ft    Wheelchair 50 feet with 2 turns activity    Assist        Assist Level: Dependent - Patient 0%   Wheelchair 150 feet activity     Assist      Assist Level: Dependent - Patient 0%    Medical Problem List and Plan: 1.  Left-sided hemiparesis secondary to infarct right posterior lentiform nucleus, thalamus, and tail of the caudate with additional punctate infarct in the lateral left thalamus.   Continue CIR ELOS 25 days; encouraged regarding his excellent progress and improvements in his strength! 2.  Impaired mobility: -DVT/anticoagulation:  Pharmaceutical: Continue Heparin             -antiplatelet therapy: Aspirin 81 mg daily and Plavix 75 mg daily until 10/30, then Plavix alone. 3. Low back pain: Add kpad. Decrease Lyrica to 25 mg 3 times daily, discontinue hydrocodone             Monitor with increased exertion 4. Anxiety: Decrease Klonopin 0.25 mg to TID times daily, Prozac 20 mg daily             -antipsychotic agents: N/A 5. Neuropsych: This patient is capable of making decisions on his own behalf. 6. Skin/Wound Care: Routine skin checks 7. Fluids/Electrolytes/Nutrition: Routine in and outs 8.  Hypertension.  Norvasc 5 mg daily, Toprol-XL 25 mg daily.    Blood pressure is labile, but elevated, as needed hydralazine added on 10/16  Increase Norvasc to 10mg .   Increase hydralazine to 100 mg TID- monitor effect            Monitor with increased mobility 9.  Hyperlipidemia.  Zocor/Zetia 10.  COPD with history of tobacco use.  Continue nebulizers as directed as well as Singulair.  Check oxygen saturations every shift 11.  Diabetes mellitus with hyperglycemia.  Hemoglobin A1c 6.7.  Continue Amaryl 0.5 mg twice daily  Elevated on 10/16, will consider medication adjustments if persistent  Provide dietary education- recommended avoiding added sugar in drinks and  meals             Monitor with increased mobility 12.  Right side parotid mass.  Follow-up outpatient ENT 13.  CKD stage III.  Baseline creatinine 1.1-1.3.               Cr up to 1.41- place nursing order to encourage 6-8 glasses of water per day and creatinine has normalized to 1.10, monitor weekly.  Repeat in am  14.  GERD.  Continue Protonix 15. Right breat toe bunion: requested ortho tech eval for orthotic. Discussed benefits of icing.  15. Bradycardia: decrease lopressor to 12.5mg  Vitals:   08/12/21 2010 08/13/21 0457  BP: (!) 161/66 (!) 147/71  Pulse: (!) 57 79  Resp: 16 16  Temp: 97.7 F (36.5 C) 98.6 F (  37 C)  SpO2: 100% 98%   16. Right CMC arthritis: discussed bracing and he defers at this time   LOS: 13 days A FACE TO FACE EVALUATION WAS PERFORMED  Martha Clan P Tannon Peerson 08/13/2021, 10:23 AM

## 2021-08-13 NOTE — Progress Notes (Signed)
Physical Therapy Session Note  Patient Details  Name: Barry [name] MRN: [id number] Date of Birth: 1933/08/23  Today's Date: 08/13/2021 PT Missed Time: 60 Minutes Missed Time Reason: Patient fatigue  Skilled Therapeutic Interventions/Progress Updates:     Pt pleasantly refusing therapy this session. He reports not feeling well with generalized fatigue, weakness, and a mild headache. He questions whether his flu shot yesterday may be contributing to this. Pt missed 60 minutes of skilled therapy.   Therapy Documentation Precautions:  Precautions Precautions: Fall Precaution Comments: L hemi, L inattention, HOH Restrictions Weight Bearing Restrictions: No  Therapy/Group: Individual Therapy  Barry [name] P Barry [name] 08/13/2021, 7:28 AM

## 2021-08-13 NOTE — Progress Notes (Signed)
Speech Language Pathology Daily Session Note  Patient Details  Name: Barry [name] MRN: [id number] Date of Birth: Jun 22, 1933  Today's Date: 08/13/2021 SLP Missed Time: 44 Minutes Missed Time Reason: Patient fatigue  Skilled Therapeutic Interventions: Pt received semi reclined in bed and reported not feeling well today. Pt politely requested to defer ST session due to generalized fatigue. Nsg made aware. Pt missed 45 minutes of skilled treatment.  Emil Weigold T Gregary Blackard 08/13/2021, 2:20 PM

## 2021-08-14 LAB — GLUCOSE, CAPILLARY
Glucose-Capillary: 127 mg/dL — ABNORMAL HIGH (ref 70–99)
Glucose-Capillary: 157 mg/dL — ABNORMAL HIGH (ref 70–99)
Glucose-Capillary: 141 mg/dL — ABNORMAL HIGH (ref 70–99)
Glucose-Capillary: 181 mg/dL — ABNORMAL HIGH (ref 70–99)

## 2021-08-14 MED ORDER — CLONAZEPAM 0.125 MG PO TBDP
0.1250 mg | ORAL_TABLET | Freq: Three times a day (TID) | ORAL | Status: DC
  Administered 2021-08-14 – 2021-08-18 (×12): 0.125 mg via ORAL
  Filled 2021-08-14 (×12): qty 1

## 2021-08-14 MED ORDER — PREGABALIN 25 MG PO CAPS
25.0000 mg | ORAL_CAPSULE | Freq: Two times a day (BID) | ORAL | Status: DC
  Administered 2021-08-14 – 2021-08-19 (×10): 25 mg via ORAL
  Filled 2021-08-14 (×10): qty 1

## 2021-08-14 MED ORDER — DICLOFENAC SODIUM 1 % EX GEL
2.0000 g | Freq: Four times a day (QID) | CUTANEOUS | Status: DC
  Administered 2021-08-14 – 2021-08-26 (×29): 2 g via TOPICAL
  Filled 2021-08-14: qty 100

## 2021-08-14 NOTE — Progress Notes (Signed)
Occupational Therapy Session Note  Patient Details  Name: Barry [name] MRN: [id number] Date of Birth: 10/01/1933  Today's Date: 08/14/2021 OT Individual Time: 1145-1230 OT Individual Time Calculation (min): 45 min    Short Term Goals: Week 2:  OT Short Term Goal 1 (Week 2): Patient will complete sit to stand transfers with Mod A and LRAD. OT Short Term Goal 2 (Week 2): Patient with complete toilet transfers with Mod A and LRAD. OT Short Term Goal 3 (Week 2): Patient will complete LB dressing with Mod A in sitting/standing with hemi technique and LRAD. OT Short Term Goal 4 (Week 2): Patient will complete 2/3 parts of toileting task with Mod A in sitting/standing with good safety awareness and LRAD.   Skilled Therapeutic Interventions/Progress Updates:    Pt sitting up in w/c, reports that he feels frustrated about not being able to go to the sink and wash up without help and also would like to be washed up and dressed before going to physical therapy.  Pt appearing perseverative on this topic throughout session with difficulty redirecting.  Therapist provided therapeutic use of self and encouraged pt to demonstrate self propulsion to sink and completion of grooming and oral hygiene tasks.  Pt able to complete with mod I therefore made nursing aware that pt safe to complete as long as he is seated in w/c.  Also collaborated with therapy schedulers to facilitate pt centered care.  Pt requesting to donn clean shirt.  Pt able to doff overhead shirt with mod I.  Min assist needed to manage buttons on clean shirt due to pt with impaired left hand coordination and arthritic pain limiting pinch in right thumb.  Educated pt verbally on use of button aide (unable to complete due to button aide not available).  Pt's lunch arrived, therefore setup provided, call bell in reach, seat alarm on at end of session.  Therapy Documentation Precautions:  Precautions Precautions: Fall Precaution Comments: L  hemi, L inattention, HOH Restrictions Weight Bearing Restrictions: No   Therapy/Group: Individual Therapy  [name] 08/14/2021, 2:01 PM

## 2021-08-14 NOTE — Progress Notes (Signed)
Speech Language Pathology Weekly Progress and Session Note  Patient Details  Name: Barry [name] MRN: [id number] Date of Birth: 07/23/1933  Beginning of progress report period:  08/07/2021 End of progress report period:  08/14/2021  Today's Date: 08/14/2021 SLP Individual Time: 1000-1100 SLP Individual Time Calculation (min): 60 min  Short Term Goals: Week 2: SLP Short Term Goal 1 (Week 2): Patient will attend to left visual field/body during functional tasks with supervision level A. SLP Short Term Goal 1 - Progress (Week 2): Met SLP Short Term Goal 2 (Week 2): Patient will perform familiar basic and mildly complex problem solving tasks with minA verbal cues. SLP Short Term Goal 2 - Progress (Week 2): Met SLP Short Term Goal 3 (Week 2): Patient will self-monitor and correct errors in functional problem solving tasks with minA verbal, visual cues. SLP Short Term Goal 3 - Progress (Week 2): Progressing toward goal SLP Short Term Goal 4 (Week 2): Patient will use external memory aides to recall recent events, orient to time with minA verbal cues to utilize. SLP Short Term Goal 4 - Progress (Week 2): Met    New Short Term Goals: Week 3: SLP Short Term Goal 1 (Week 3): Patient will demonstrate awareness to errors and difficulties during ADL tasks with minA verbal cues. SLP Short Term Goal 2 (Week 3): Patient will be able to demonstrate recall and understanding after reading and discussion of stroke and other health related topics, with minA verbal cues. SLP Short Term Goal 3 (Week 3): After initial instructions and demonstration, patient will be able to perform novel cognitive tasks/games/exercises with minA verbal and visual cues for accuracy. SLP Short Term Goal 4 (Week 3): Patient will demonstrate adequate anticipatory awareness by describing at least 4 ADL's that he will need assistance with as well as what type of assistance he will require, with modA verbal cues.  Weekly  Progress Updates:  Patient made good progress, meeting 3/4 STGs, but demonstrating progress towards the one he did not meet (self monitoring for errors). Patient continues to have reduced awareness and insight into the impact of his impairments. He does recognize physical impairments but not cognitive impairments. When working on functional tasks with SLP, he will frequently try to defer to after discharge or just report that he can do it without difficulty. He will participate in novel cognitive tasks but does not demonstrate understanding of their purpose. He continues to benefit from skilled SLP intervention to maximize cognitive impairment prior to discharge.   Intensity: Minumum of 1-2 x/day, 30 to 90 minutes Frequency: 3 to 5 out of 7 days Duration/Length of Stay: 11/8 Treatment/Interventions: Cognitive remediation/compensation;Internal/external aids;Therapeutic Activities;Cueing hierarchy;Environmental controls;Functional tasks;Patient/family education   Daily Session  Skilled Therapeutic Interventions: Patient seen for skilled ST session focused on cognitive function goals. Patient reported that he was feeling better today(missed all therapies yesterday due to bad headache and feeling generally unwell). Patient participated fully but he does continue to not demonstrate adequate awareness to impact of his deficits on his current and future level of function. When presented with tasks/activities such as reading Stroke information packet, he will say, "I was going to do that when Im at home." He also reported that he wanted to wait until he got home and talked to his regular doctor to find out the things he could or could not do. He is hopeful that he will be able to do things for himself when he discharges and did not seem to understand that he will  need more therapy after discharge from hospital. He completed logic puzzle with 100% accuracy and SLP providing min intensity and mod frequency of  cues. Patient left in chair with alarm belt on and all needs in reach. He continues to benefit from skilled SLP intervention to maximize cognitive function prior to discharge.     General    Pain Pain Assessment Pain Scale: 0-10 Pain Score: 0-No pain  Therapy/Group: Individual Therapy  Sonia Baller, MA, CCC-SLP Speech Therapy

## 2021-08-14 NOTE — Progress Notes (Signed)
Occupational Therapy Session Note  Patient Details  Name: Barry [name] MRN: [id number] Date of Birth: 22-Apr-1933  Today's Date: 08/14/2021 OT Individual Time: 7253-6644 OT Individual Time Calculation (min): 50 min    Short Term Goals: Week 1:  OT Short Term Goal 1 (Week 1): Pt will maintain static sitting balance EOB with no more than intermittent min A in prep for seated ADL. OT Short Term Goal 1 - Progress (Week 1): Met OT Short Term Goal 2 (Week 1): Pt will completed sit to stand with max of 1 in prep for standing ADL. OT Short Term Goal 2 - Progress (Week 1): Met OT Short Term Goal 3 (Week 1): Pt will don shirt with min A. OT Short Term Goal 3 - Progress (Week 1): Met OT Short Term Goal 4 (Week 1): Pt will don pants with max A. OT Short Term Goal 4 - Progress (Week 1): Met OT Short Term Goal 5 (Week 1): Pt will complete toilet transfer with max and LRAD. OT Short Term Goal 5 - Progress (Week 1): Met Week 2:  OT Short Term Goal 1 (Week 2): Patient will complete sit to stand transfers with Mod A and LRAD. OT Short Term Goal 2 (Week 2): Patient with complete toilet transfers with Mod A and LRAD. OT Short Term Goal 3 (Week 2): Patient will complete LB dressing with Mod A in sitting/standing with hemi technique and LRAD. OT Short Term Goal 4 (Week 2): Patient will complete 2/3 parts of toileting task with Mod A in sitting/standing with good safety awareness and LRAD.  Skilled Therapeutic Interventions/Progress Updates:  Pt greeted seated in w/c  agreeable to OT intervention. Session focus on therapeutic activities focused on LUE coordination, dynamic standing balance, LUE NMR and BADL reeducation. Pt transported to gym from w/c with total A where pt completed dynamic reaching task with RUE and LUE where pt instucted to reach out of BOS to tap stimulus on screen from w/c. Pt scored a 84.85% accuracy score with LUE with 2.13 sec reaction time. Pt completed task with RUE receiving a  52.04% accuracy score with a 1.17 reaction time. Pt reports even though his LUE is his hemiparetic side he has arthritis in R hand and he finds it difficult to isolate digits on R hand in order to tap stimulus with accuracy. Pt completed task for 2nd trial with 3lb weight added to LUE to facilitate NMR to LUE. pt completed task with a 56.41 % accuracy score and a reaction time of 2.61 secs.   Pt additionally completed various standing therapeutic activities to faciliate improved LUE coordination and LLE strength. Pt completed sit<>stand from w/c with MIN A with RW with pt instructed to reach out of BOS with LUE to retrieve horse shoes positioned across midline and toss horseshoes to target. Pt completed task with MOD A as pt needed manual facilitation at L quad to decrease buckling in LLE. Pt completed task x2 sets. Pt additionally completed [facility] task where pt instructed to place small 7 pegs in peg board and then remove pegs with R hand and then L hand. OTA timed pt with pt needing 21 secs to complete task with R hand and 40 secs to complete task with L hand. Pt returned to room with total A where pt report need to void b/b. Pt required MIN A for stand pivot transfer with grab bars to [facility] over toilet, total A for posterior pericare as pt unsafely attempting to bend too far forward  to reach buttock and MOD A for toileting mgmt. pt left seated in w/c with alarm belt activated and all needs within reach.                     Therapy Documentation Precautions:  Precautions Precautions: Fall Precaution Comments: L hemi, L inattention, HOH Restrictions Weight Bearing Restrictions: No  Pain: pt reports no pain during session     Therapy/Group: Individual Therapy  Barry [name] 08/14/2021, 3:32 PM

## 2021-08-14 NOTE — Progress Notes (Signed)
Physical Therapy Session Note  Patient Details  Name: Barry [name] MRN: [id number] Date of Birth: 1933/06/02  Today's Date: 08/14/2021 PT Individual Time: 0800-0855 PT Individual Time Calculation (min): 55 min   Short Term Goals: Week 2:  PT Short Term Goal 1 (Week 2): Pt will complete bed mobility with minA PT Short Term Goal 2 (Week 2): Pt will complete bed<>chair transfers with minA and LRAD PT Short Term Goal 3 (Week 2): Pt will ambulate 27ft with minA and LRAD   Skilled Therapeutic Interventions/Progress Updates:     Pt seen supine in bed to start session - he's awake and agreeable to PT tx. Denies pain. Reports frustration that nursing staff didn't assist in getting him ready (dressed) for PT this AM. Donned jeans at bed level with minA for threading. Supine<>sit with HOB Flat and use of bed rail, requiring minA for trunk management to upright. Donned socks and shoes with totalA for time. He was able to remove dirty t-shirt and don clean t-shirt with setupA while seated EOB. Squat<>pivot transfer requiring modA from EOB to w/c with difficulty clearing hips over w/c cushion and required x3 scoots to fully achieve transfer.   Pt requesting to be brought to sink to complete ADL such as oral care, washing his face, combing hair, applying deodorant to underarms, etc. All completed without assist but encouraged use of LUE into daily tasks. He did c/o mild headache that he contributes to yesterday's flu shot. BP assessed in sitting reading 151/59 (87) with HR 72. RN notified of headache.   Pt transported to main rehab gym for time in w/c. Focused remainder of session on gait training using Ethelene Hal in hallways. He ambulated 247ft + 223ft + 226ft (2-3 minute seated rest b/w bouts) with minA and Ethelene Hal with +2 assist for steering/driving walker. He requires assist mostly for lateral weight shift and steadying as he's able to initiate LLE swing/step/placement without assist. Demonstrated  intermittent L toe drag and occasional "tripping" of L foot over R heel during swing. Applied blue theraband to walker to assist with visual cue for widening his step on his L which had good carryover.   Pt transported back to his room in w/c at completion of session. Safety belt alarm on and all needs in reach.     Therapy Documentation Precautions:  Precautions Precautions: Fall Precaution Comments: L hemi, L inattention, HOH Restrictions Weight Bearing Restrictions: No General:    Therapy/Group: Individual Therapy  Vallie Teters P Shivangi Lutz PT 08/14/2021, 7:27 AM

## 2021-08-14 NOTE — Progress Notes (Signed)
[place] PHYSICAL MEDICINE & REHABILITATION PROGRESS NOTE  Subjective/Complaints: Had some symptoms after flu shot but is feeling much better today. Did not have chills. He asks about right thumb pain- discussed there appears to be some CMC arthritis and trigger finger   ROS: Denies CP, SOB, N/V/D, +right great toe pain- resolved on its own, + low back pain- resolved currently, +lower extremity weakness- improving, +right thumb pain  Objective: Vital Signs: Blood pressure (!) 143/65, pulse 69, temperature 98 F (36.7 C), temperature source Oral, resp. rate 15, height 5\' 11"  (1.803 m), weight 74.2 kg, SpO2 96 %. No results found. No results for input(s): WBC, HGB, HCT, PLT in the last 72 hours.  No results for input(s): NA, K, CL, CO2, GLUCOSE, BUN, CREATININE, CALCIUM in the last 72 hours.    Intake/Output Summary (Last 24 hours) at 08/14/2021 1131 Last data filed at 08/14/2021 0831 Gross per 24 hour  Intake 1080 ml  Output 1925 ml  Net -845 ml        Physical Exam: BP (!) 143/65 (BP Location: Left Arm)   Pulse 69   Temp 98 F (36.7 C) (Oral)   Resp 15   Ht 5\' 11"  (1.803 m)   Wt 74.2 kg   SpO2 96%   BMI 22.81 kg/m  Gen: no distress, normal appearing HEENT: oral mucosa pink and moist, NCAT Cardio: Reg rate Chest: normal effort, normal rate of breathing Abd: soft, non-distended Ext: no edema Psych: pleasant, normal affect Skin: intact Musculoskeletal: TTP over right thumb CMC joint, some triggering present Neuro: Makes eye contact with examiner.   Follows commands.   Name and age.   He does have some mild delay in processing. Difficulty with medication management.  Dysarthria, unchanged Motor: RUE/RLE: 4/5 proximal distal LUE: 4-/5 proximal distal LLE: 3/5 proximal 2/5 distal,  Dysmetria LUE and LLE  Assessment/Plan: 1. Functional deficits which require 3+ hours per day of interdisciplinary therapy in a comprehensive inpatient rehab setting. Physiatrist  is providing close team supervision and 24 hour management of active medical problems listed below. Physiatrist and rehab team continue to assess barriers to discharge/monitor patient progress toward functional and medical goals   Care Tool:  Bathing    Body parts bathed by patient: Right arm, Left arm, Chest, Abdomen, Front perineal area, Right upper leg, Left upper leg, Face   Body parts bathed by helper: Buttocks, Right lower leg, Left lower leg     Bathing assist Assist Level: Moderate Assistance - Patient 50 - 74%     Upper Body Dressing/Undressing Upper body dressing   What is the patient wearing?: Pull over shirt    Upper body assist Assist Level: Minimal Assistance - Patient > 75%    Lower Body Dressing/Undressing Lower body dressing      What is the patient wearing?: Pants     Lower body assist Assist for lower body dressing: Maximal Assistance - Patient 25 - 49%     Toileting Toileting    Toileting assist Assist for toileting: Maximal Assistance - Patient 25 - 49%     Transfers Chair/bed transfer  Transfers assist     Chair/bed transfer assist level: Moderate Assistance - Patient 50 - 74%     Locomotion Ambulation   Ambulation assist   Ambulation activity did not occur: Safety/medical concerns  Assist level: 2 helpers Assistive device: Ethelene Hal Max distance: 237ft   Walk 10 feet activity   Assist  Walk 10 feet activity did not occur: Safety/medical concerns  Assist level: 2 helpers Assistive device: Walker-Eva   Walk 50 feet activity   Assist Walk 50 feet with 2 turns activity did not occur: Safety/medical concerns  Assist level: 2 helpers Assistive device: Walker-Eva    Walk 150 feet activity   Assist Walk 150 feet activity did not occur: Safety/medical concerns  Assist level: 2 helpers Assistive device: Walker-Eva    Walk 10 feet on uneven surface  activity   Assist Walk 10 feet on uneven surfaces activity did not  occur: Safety/medical concerns         Wheelchair     Assist Is the patient using a wheelchair?: Yes Type of Wheelchair: Manual    Wheelchair assist level: Supervision/Verbal cueing Max wheelchair distance: 40ft    Wheelchair 50 feet with 2 turns activity    Assist        Assist Level: Dependent - Patient 0%   Wheelchair 150 feet activity     Assist      Assist Level: Dependent - Patient 0%    Medical Problem List and Plan: 1.  Left-sided hemiparesis secondary to infarct right posterior lentiform nucleus, thalamus, and tail of the caudate with additional punctate infarct in the lateral left thalamus.   Continue CIR ELOS 25 days; encouraged regarding his excellent progress and improvements in his strength! 2.  Impaired mobility: -DVT/anticoagulation:  Pharmaceutical: Continue Heparin             -antiplatelet therapy: Aspirin 81 mg daily and Plavix 75 mg daily until 10/30, then Plavix alone. 3. Low back pain: Improved. Add kpad. Decrease Lyrica to 25 mg 2 times daily, discontinue hydrocodone             Monitor with increased exertion 4. Anxiety: Decrease Klonopin 0.125 mg TID times daily, Prozac 20 mg daily             -antipsychotic agents: N/A 5. Neuropsych: This patient is capable of making decisions on his own behalf. 6. Skin/Wound Care: Routine skin checks 7. Fluids/Electrolytes/Nutrition: Routine in and outs 8.  Hypertension.  Norvasc 5 mg daily, Toprol-XL 25 mg daily.    Blood pressure is labile, but elevated, as needed hydralazine added on 10/16  Increase Norvasc to 10mg .   Increase hydralazine to 100 mg TID- monitor effect            Monitor with increased mobility 9.  Hyperlipidemia.  Zocor/Zetia 10.  COPD with history of tobacco use.  Continue nebulizers as directed as well as Singulair.  Check oxygen saturations every shift 11.  Diabetes mellitus with hyperglycemia.  Hemoglobin A1c 6.7.  Continue Amaryl 0.5 mg twice daily  Elevated on 10/16,  will consider medication adjustments if persistent  Provide dietary education- recommended avoiding added sugar in drinks and meals             Monitor with increased mobility 12.  Right side parotid mass.  Follow-up outpatient ENT 13.  CKD stage III.  Baseline creatinine 1.1-1.3.               Cr up to 1.41- place nursing order to encourage 6-8 glasses of water per day and creatinine has normalized to 1.10, monitor weekly.  Repeat in am  14.  GERD.  Continue Protonix 15. Right breat toe bunion: requested ortho tech eval for orthotic. Discussed benefits of icing.  15. Bradycardia: decrease lopressor to 12.5mg  Vitals:   08/13/21 1954 08/14/21 0623  BP: (!) 154/60 (!) 143/65  Pulse: 60 69  Resp: 17  15  Temp: 97.9 F (36.6 C) 98 F (36.7 C)  SpO2: 96% 96%   16. Right CMC arthritis: discussed bracing and he defers at this time. Added voltaren gel QID   LOS: 14 days A FACE TO FACE EVALUATION WAS PERFORMED  Clide Deutscher Kodie Pick 08/14/2021, 11:31 AM

## 2021-08-15 LAB — GLUCOSE, CAPILLARY
Glucose-Capillary: 142 mg/dL — ABNORMAL HIGH (ref 70–99)
Glucose-Capillary: 125 mg/dL — ABNORMAL HIGH (ref 70–99)
Glucose-Capillary: 198 mg/dL — ABNORMAL HIGH (ref 70–99)
Glucose-Capillary: 124 mg/dL — ABNORMAL HIGH (ref 70–99)

## 2021-08-15 MED ORDER — TRAZODONE HCL 50 MG PO TABS
50.0000 mg | ORAL_TABLET | Freq: Every day | ORAL | Status: DC
  Administered 2021-08-15 – 2021-08-25 (×11): 50 mg via ORAL
  Filled 2021-08-15 (×11): qty 1

## 2021-08-15 NOTE — Progress Notes (Signed)
[place] PHYSICAL MEDICINE & REHABILITATION PROGRESS NOTE  Subjective/Complaints: Notes voltaren gel was very helpful for R thumb pain; On kapd currently for back. Having bad dreams sometimes at night- then wakes up sweating and gets cold- is more chronic- but more frequent here- thinks elevated BP was due to this.     ROS:  Pt denies SOB, abd pain, CP, N/V/C/D, and vision changes   Objective: Vital Signs: Blood pressure (!) 149/62, pulse 68, temperature 97.9 F (36.6 C), temperature source Oral, resp. rate 18, height 5\' 11"  (1.803 m), weight 74.2 kg, SpO2 100 %. No results found. No results for input(s): WBC, HGB, HCT, PLT in the last 72 hours.  No results for input(s): NA, K, CL, CO2, GLUCOSE, BUN, CREATININE, CALCIUM in the last 72 hours.    Intake/Output Summary (Last 24 hours) at 08/15/2021 0938 Last data filed at 08/15/2021 0343 Gross per 24 hour  Intake 720 ml  Output 1900 ml  Net -1180 ml        Physical Exam: BP (!) 149/62 (BP Location: Right Arm)   Pulse 68   Temp 97.9 F (36.6 C) (Oral)   Resp 18   Ht 5\' 11"  (1.803 m)   Wt 74.2 kg   SpO2 100%   BMI 22.81 kg/m     General: awake, alert, appropriate, sitting up in bed; on kpad;  NAD HENT: conjugate gaze; oropharynx moist CV: regular rate; no JVD Pulmonary: CTA B/L; no W/R/R- good air movement GI: soft, NT, ND, (+)BS Psychiatric: appropriate- interactive Neurological: alert  Musculoskeletal: much less TTP over right thumb CMC joint, -no swelling; no erythema Neuro: Makes eye contact with examiner.   Follows commands.   Name and age.   He does have some mild delay in processing. Difficulty with medication management.  Dysarthria, unchanged Motor: RUE/RLE: 4/5 proximal distal LUE: 4-/5 proximal distal LLE: 3/5 proximal 2/5 distal,  Dysmetria LUE and LLE  Assessment/Plan: 1. Functional deficits which require 3+ hours per day of interdisciplinary therapy in a comprehensive inpatient rehab  setting. Physiatrist is providing close team supervision and 24 hour management of active medical problems listed below. Physiatrist and rehab team continue to assess barriers to discharge/monitor patient progress toward functional and medical goals   Care Tool:  Bathing    Body parts bathed by patient: Right arm, Left arm, Chest, Abdomen, Front perineal area, Right upper leg, Left upper leg, Face   Body parts bathed by helper: Buttocks, Right lower leg, Left lower leg     Bathing assist Assist Level: Moderate Assistance - Patient 50 - 74%     Upper Body Dressing/Undressing Upper body dressing   What is the patient wearing?: Pull over shirt    Upper body assist Assist Level: Minimal Assistance - Patient > 75%    Lower Body Dressing/Undressing Lower body dressing      What is the patient wearing?: Pants     Lower body assist Assist for lower body dressing: Maximal Assistance - Patient 25 - 49%     Toileting Toileting    Toileting assist Assist for toileting: Moderate Assistance - Patient 50 - 74%     Transfers Chair/bed transfer  Transfers assist     Chair/bed transfer assist level: Moderate Assistance - Patient 50 - 74%     Locomotion Ambulation   Ambulation assist   Ambulation activity did not occur: Safety/medical concerns  Assist level: 2 helpers Assistive device: Ethelene Hal Max distance: 242ft   Walk 10 feet activity  Assist  Walk 10 feet activity did not occur: Safety/medical concerns  Assist level: 2 helpers Assistive device: Walker-Eva   Walk 50 feet activity   Assist Walk 50 feet with 2 turns activity did not occur: Safety/medical concerns  Assist level: 2 helpers Assistive device: Walker-Eva    Walk 150 feet activity   Assist Walk 150 feet activity did not occur: Safety/medical concerns  Assist level: 2 helpers Assistive device: Walker-Eva    Walk 10 feet on uneven surface  activity   Assist Walk 10 feet on uneven  surfaces activity did not occur: Safety/medical concerns         Wheelchair     Assist Is the patient using a wheelchair?: Yes Type of Wheelchair: Manual    Wheelchair assist level: Supervision/Verbal cueing Max wheelchair distance: 29ft    Wheelchair 50 feet with 2 turns activity    Assist        Assist Level: Dependent - Patient 0%   Wheelchair 150 feet activity     Assist      Assist Level: Dependent - Patient 0%    Medical Problem List and Plan: 1.  Left-sided hemiparesis secondary to infarct right posterior lentiform nucleus, thalamus, and tail of the caudate with additional punctate infarct in the lateral left thalamus.   Continue CIR ELOS 25 days; encouraged regarding his excellent progress and improvements in his strength! Con't PT and OT/CIR 2.  Impaired mobility: -DVT/anticoagulation:  Pharmaceutical: Continue Heparin  10/29- suggest team changes to Lovenox if OK?             -antiplatelet therapy: Aspirin 81 mg daily and Plavix 75 mg daily until 10/30, then Plavix alone. 3. Low back pain: Improved. Add kpad. Decrease Lyrica to 25 mg 2 times daily, discontinue hydrocodone  10/29- pain doing better with voltaren gel- con't regimen             Monitor with increased exertion 4. Anxiety: Decrease Klonopin 0.125 mg TID times daily, Prozac 20 mg daily             -antipsychotic agents: N/A 5. Neuropsych: This patient is capable of making decisions on his own behalf. 6. Skin/Wound Care: Routine skin checks 7. Fluids/Electrolytes/Nutrition: Routine in and outs 8.  Hypertension.  Norvasc 5 mg daily, Toprol-XL 25 mg daily.    Blood pressure is labile, but elevated, as needed hydralazine added on 10/16  Increase Norvasc to 10mg .   Increase hydralazine to 100 mg TID- monitor effect  10/29- just increased Hydralazine- overall better, so will con't regimen            Monitor with increased mobility 9.  Hyperlipidemia.  Zocor/Zetia 10.  COPD with history  of tobacco use.  Continue nebulizers as directed as well as Singulair.  Check oxygen saturations every shift 11.  Diabetes mellitus with hyperglycemia.  Hemoglobin A1c 6.7.  Continue Amaryl 0.5 mg twice daily  Elevated on 10/16, will consider medication adjustments if persistent  Provide dietary education- recommended avoiding added sugar in drinks and meals             Monitor with increased mobility 12.  Right side parotid mass.  Follow-up outpatient ENT 13.  CKD stage III.  Baseline creatinine 1.1-1.3.               Cr up to 1.41- place nursing order to encourage 6-8 glasses of water per day and creatinine has normalized to 1.10, monitor weekly.  Repeat in am  14.  GERD.  Continue Protonix 15. Right breat toe bunion: requested ortho tech eval for orthotic. Discussed benefits of icing.  15. Bradycardia: decrease lopressor to 12.5mg  Vitals:   08/14/21 2046 08/15/21 0531  BP: (!) 172/68 (!) 149/62  Pulse:  68  Resp:  18  Temp:  97.9 F (36.6 C)  SpO2:  100%   16. Right CMC arthritis: discussed bracing and he defers at this time. Added voltaren gel QID  10/29- pt reports pain better- con't regimen 17. Disturbed sleep  10/29- will try Traozdone for sleep- to help him sleep through- 50 mg QHS for now    LOS: 15 days A FACE TO FACE EVALUATION WAS PERFORMED  Veyda Kaufman 08/15/2021, 9:38 AM

## 2021-08-15 NOTE — Progress Notes (Signed)
Speech Language Pathology Daily Session Note  Patient Details  Name: Barry [name] MRN: [id number] Date of Birth: [birth year]  Today's Date: 08/15/2021 SLP Individual Time: 1445-1530 SLP Individual Time Calculation (min): 45 min  Short Term Goals: Week 3: SLP Short Term Goal 1 (Week 3): Patient will demonstrate awareness to errors and difficulties during ADL tasks with minA verbal cues. SLP Short Term Goal 2 (Week 3): Patient will be able to demonstrate recall and understanding after reading and discussion of stroke and other health related topics, with minA verbal cues. SLP Short Term Goal 3 (Week 3): After initial instructions and demonstration, patient will be able to perform novel cognitive tasks/games/exercises with minA verbal and visual cues for accuracy. SLP Short Term Goal 4 (Week 3): Patient will demonstrate adequate anticipatory awareness by describing at least 4 ADL's that he will need assistance with as well as what type of assistance he will require, with modA verbal cues.  Skilled Therapeutic Interventions: Pt received upright in wheelchair and agreeable to skilled ST intervention with focus on cognitive goals. SLP facilitated session by providing sup A verbal cues for working memory and error awareness with medication management task by identifying medication organization errors in daily BID pillbox. SLP educated on organization and memory strategies as it pertained to medication safety. Pt verbalized understanding by summarizing the medication system he uses at home which sounded like a pill organizer system but not necessarily a pillbox. Patient was left in bed with alarm activated and immediate needs within reach at end of session. Continue per current plan of care.      Pain Pain Assessment Pain Scale: 0-10 Pain Score: 5   Therapy/Group: Individual Therapy  Patty Sermons 08/15/2021, 3:06 PM

## 2021-08-16 LAB — GLUCOSE, CAPILLARY
Glucose-Capillary: 141 mg/dL — ABNORMAL HIGH (ref 70–99)
Glucose-Capillary: 199 mg/dL — ABNORMAL HIGH (ref 70–99)
Glucose-Capillary: 135 mg/dL — ABNORMAL HIGH (ref 70–99)
Glucose-Capillary: 191 mg/dL — ABNORMAL HIGH (ref 70–99)

## 2021-08-16 MED ORDER — CALCIUM CARBONATE ANTACID 500 MG PO CHEW
1.0000 | CHEWABLE_TABLET | Freq: Four times a day (QID) | ORAL | Status: DC | PRN
  Administered 2021-08-16: 400 mg via ORAL
  Filled 2021-08-16: qty 2

## 2021-08-16 NOTE — Progress Notes (Signed)
Physical Therapy Session Note  Patient Details  Name: Barry [name] MRN: [id number] Date of Birth: 1933-09-06  Today's Date: 08/16/2021 PT Individual Time: 0915-1010 PT Individual Time Calculation (min): 55 min   Short Term Goals: Week 2:  PT Short Term Goal 1 (Week 2): Pt will complete bed mobility with minA PT Short Term Goal 2 (Week 2): Pt will complete bed<>chair transfers with minA and LRAD PT Short Term Goal 3 (Week 2): Pt will ambulate 41ft with minA and LRAD  Skilled Therapeutic Interventions/Progress Updates:     Patient in bed upon PT arrival. Patient unaware that he had a therapy schedule today, PT printed schedule for patient. Patient reported that he was "not having the best morning. " Reports that he slept fairly well, however, had persistent thoughts and concerns about his loss of independence and the burden of care he may require in the future. PT provided therapeutic listening and educated on stroke recovery, patient's current progress in CIR, and expectations to continue to progress with follow-up therapy and after to regain highest level of independence. Educated on the grieving process with significant loss of function and benefits of thinking about progress week to week, rather than day to day, to improved observation of patient's improvement. Patient appreciative of education/discussion.   Patient agreeable to PT session with request to focus on bathing and dressing. Focused on dynamic sitting and standing balance and functional motor control of L hemi-body during functional ADLs.   Patient sat EOB with min A-CGA in a flat bed without use of bed rails. Provided verbal cues and manual facilitation for rolling through side-lying, brining knees to chest to bring legs off the bed, rolling top shoulder forward to push up to his bottom elbow then hand to come to sitting.   Patient sat EOB >20 min with supervision to perform bathing/dressing as described below. Focused on  trunk control as patient with L LOB x3 and increased L lateral lean with increased challenge of activity, provided min multimodal cues for midline orientation and L trunk activation throughout. Cued patient for equal use of R and L hand for bathing and dressing with increased time on L without assist to complete tasks. Patient performed sit to/from stand x1 without AD with min A, x1 with RW with min A with prolonged stand >1 min, and stand pivot bed>w/c with min-mod A without AD. Provided cues for L forward weight shift and knee/hip extension to come to and remain in standing, performed weight shifting R/L x5 before initiating stepping to pivot to the w/c focused on L limb and trunk elongation in stance, PT blocking L knee for safety without buckling, but increased flexion in stance, patient able to perform stepping with L foot without assist.   Bathing/dressing: doffed/donned shirt over-head with set-up assist, total A for two top buttons, doffed/donned pants with min A to thread lower extremities through and cues for donning hemi-side first, and total A to pull down/up in standing, and supervision for buttoning performing in lying, doffed/donned incontinence brief, TED hose, and shoes with max-total A with focus on mobility and balance in sitting/standing, performed upper and lower body bathing with mod-min A with assist only for his back and bottom. Facilitated L leg crossing, unable to perform figure four sitting for L lower body bathing and dressing.   Patient in w/c in the room at end of session with breaks locked, seat belt alarm set, and all needs within reach. Patient reports sliding forward in the w/c during  previous sitting trials. Educated patient on technique to scoot hips back in the chair, patient able to perform x2. Let out air from Roho cushion due to overinflation to improve sitting balance and tolerance.   Therapy Documentation Precautions:  Precautions Precautions: Fall Precaution  Comments: L hemi, L inattention, HOH Restrictions Weight Bearing Restrictions: No    Therapy/Group: Individual Therapy  Kevyn Boquet L Rossetta Kama PT, DPT  08/16/2021, 10:42 AM

## 2021-08-17 ENCOUNTER — Encounter (HOSPITAL_COMMUNITY): Payer: Medicare Other

## 2021-08-17 LAB — GLUCOSE, CAPILLARY
Glucose-Capillary: 114 mg/dL — ABNORMAL HIGH (ref 70–99)
Glucose-Capillary: 147 mg/dL — ABNORMAL HIGH (ref 70–99)
Glucose-Capillary: 210 mg/dL — ABNORMAL HIGH (ref 70–99)
Glucose-Capillary: 130 mg/dL — ABNORMAL HIGH (ref 70–99)

## 2021-08-17 NOTE — Progress Notes (Signed)
Physical Therapy Weekly Progress Note  Patient Details  Name: Barry [name] MRN: [id number] Date of Birth: 06-19-1933  Beginning of progress report period: August 10, 2021 End of progress report period: August 17, 2021  Today's Date: 08/17/2021 PT Individual Time: 1100-1157 PT Individual Time Calculation (min): 57 min   Patient has met 1 of 3 short term goals.  Pt is continuing to make appropriate progress towards goals. He requires minA for bed mobility, supervision for static sitting balance and minA for dynamic sitting balance, minA for sit<>stand transfers and modA for squat<>pivot transfers. He has shown ability to ambulate ~214f with use of EEthelene Haland +2 assist (min/modA overall). He has also shown ability to navigate up/down x4 steps with +2 modA with use of both hand rails. He continues to be primarily limited by L inattention, L hemibody weakness, poor postural control/awareness, motor apraxia, and global deconditioning.  Patient continues to demonstrate the following deficits muscle weakness, decreased cardiorespiratoy endurance, unbalanced muscle activation, motor apraxia, and decreased motor planning, decreased midline orientation, decreased attention to left, and decreased motor planning, decreased attention, decreased awareness, decreased problem solving, decreased safety awareness, and decreased memory, and decreased sitting balance, decreased standing balance, decreased postural control, hemiplegia, and decreased balance strategies and therefore will continue to benefit from skilled PT intervention to increase functional independence with mobility.  Patient progressing toward long term goals..  Continue plan of care.  PT Short Term Goals Week 2:  PT Short Term Goal 1 (Week 2): Pt will complete bed mobility with minA PT Short Term Goal 1 - Progress (Week 2): Met PT Short Term Goal 2 (Week 2): Pt will complete bed<>chair transfers with minA and LRAD PT Short Term Goal  2 - Progress (Week 2): Partly met PT Short Term Goal 3 (Week 2): Pt will ambulate 735fwith minA and LRAD PT Short Term Goal 3 - Progress (Week 2): Partly met Week 3:  PT Short Term Goal 1 (Week 3): STG = LTG due to ELOS  Skilled Therapeutic Interventions/Progress Updates:     Pt seated in w/c to start session - agreeable to PT tx. No reports of pain but does report L foot swelling - he says his nurse is aware.   Pt transported to day room rehab gym for time. Completed gait training [id number] modA +2 with RW - +2 assist primarily needed for RW management as pt lacks control and pushes too far forwards. He required modA from therapist for reducing L lateral lean and max cues for increasing awareness of LLE during gait, especially with increasing L foot clearance and increasing step height. He required x2 standing rest breaks during each gait trial.   Next, assisted onto Nustep and pt completed a total of 8 minutes at workload 5, (rest break at 5 minute mark) using BLE and BUE with emphasis on L awareness and monitoring L knee alignment (required assist for knee alignment for last 4 minutes due to fatigue. Pt assisted back to his w/c via squat<>pivot transfer with modA. Returned to his room where he remained seated in w/c with all needs in reach and chair alarm on. NT at bedside collecting BG levels.  Therapy Documentation Precautions:  Precautions Precautions: Fall Precaution Comments: L hemi, L inattention, HOH Restrictions Weight Bearing Restrictions: No General:    Therapy/Group: Individual Therapy  Ahmar Pickrell P Mattea Seger 08/17/2021, 7:26 AM

## 2021-08-17 NOTE — Progress Notes (Signed)
Occupational Therapy Session Note  Patient Details  Name: Barry [name] MRN: [id number] Date of Birth: Mar 23, 1933  Today's Date: 08/17/2021 OT Individual Time: 1330-1400 OT Individual Time Calculation (min): 30 min    Short Term Goals: Week 2:  OT Short Term Goal 1 (Week 2): Patient will complete sit to stand transfers with Mod A and LRAD. OT Short Term Goal 2 (Week 2): Patient with complete toilet transfers with Mod A and LRAD. OT Short Term Goal 3 (Week 2): Patient will complete LB dressing with Mod A in sitting/standing with hemi technique and LRAD. OT Short Term Goal 4 (Week 2): Patient will complete 2/3 parts of toileting task with Mod A in sitting/standing with good safety awareness and LRAD.   Skilled Therapeutic Interventions/Progress Updates:    Pt supine in bed, reporting he slept terrible last night due to feeling worried about left foot swelling and also his daughter in poor health and worried about an upcoming doctors appointment.  Assessed left foot noting grade 2 pitting edema.  No swelling in right foot.  No redness, heat, or pain associated with left foot/ankle edema.  Instructed pt on AROM left ankle dorsi/plantar flexion x 30 reps to encourage lymph drainage.  Also applied Ktape to lateral and medial side of ankle/foot to reduce swelling.  Instructed pt through precision grasp/reach/place on table using a grip ball.  Pt exhibiting mild ataxia LUE gross motor movements.  Also instructed pt on [facility] of tying shoe in lap and pt able to complete WNL which is improved.  Elevated LE's above heart, Call bell in reach, bed alarm on.  Therapy Documentation Precautions:  Precautions Precautions: Fall Precaution Comments: L hemi, L inattention, HOH Restrictions Weight Bearing Restrictions: No    Therapy/Group: Individual Therapy  [name] 08/17/2021, 4:14 PM

## 2021-08-17 NOTE — Progress Notes (Signed)
[facility] PHYSICAL MEDICINE & REHABILITATION PROGRESS NOTE  Subjective/Complaints: Has some anxiety regarding returning home, he would like to do so independently eventually His son visited this weekend and had some questions for me  ROS:  Pt denies SOB, abd pain, CP, N/V/C/D, and vision changes, +left foot swelling   Objective: Vital Signs: Blood pressure (!) 147/59, pulse 67, temperature 98.1 F (36.7 C), temperature source Oral, resp. rate 18, height 5\' 11"  (1.803 m), weight 74.2 kg, SpO2 98 %. No results found. No results for input(s): WBC, HGB, HCT, PLT in the last 72 hours.  No results for input(s): NA, K, CL, CO2, GLUCOSE, BUN, CREATININE, CALCIUM in the last 72 hours.    Intake/Output Summary (Last 24 hours) at 08/17/2021 1034 Last data filed at 08/17/2021 0715 Gross per 24 hour  Intake 960 ml  Output 1550 ml  Net -590 ml        Physical Exam: BP (!) 147/59   Pulse 67   Temp 98.1 F (36.7 C) (Oral)   Resp 18   Ht 5\' 11"  (1.803 m)   Wt 74.2 kg   SpO2 98%   BMI 22.81 kg/m  Gen: no distress, normal appearing HEENT: oral mucosa pink and moist, NCAT Cardio: Reg rate Chest: normal effort, normal rate of breathing Abd: soft, non-distended Ext: no edema Psych: pleasant, normal affect Skin: intact  Musculoskeletal: much less TTP over right thumb CMC joint, -no swelling; no erythema Neuro: Makes eye contact with examiner.   Follows commands.   Name and age.   He does have some mild delay in processing. Difficulty with medication management.  Dysarthria, unchanged Motor: RUE/RLE: 4/5 proximal distal LUE: 4-/5 proximal distal LLE: 3/5 proximal 2/5 distal,  Dysmetria LUE and LLE  Assessment/Plan: 1. Functional deficits which require 3+ hours per day of interdisciplinary therapy in a comprehensive inpatient rehab setting. Physiatrist is providing close team supervision and 24 hour management of active medical problems listed below. Physiatrist and rehab  team continue to assess barriers to discharge/monitor patient progress toward functional and medical goals   Care Tool:  Bathing    Body parts bathed by patient: Right arm, Left arm, Chest, Abdomen, Front perineal area, Right upper leg, Left upper leg, Face   Body parts bathed by helper: Buttocks, Right lower leg, Left lower leg     Bathing assist Assist Level: Moderate Assistance - Patient 50 - 74%     Upper Body Dressing/Undressing Upper body dressing   What is the patient wearing?: Pull over shirt    Upper body assist Assist Level: Minimal Assistance - Patient > 75%    Lower Body Dressing/Undressing Lower body dressing      What is the patient wearing?: Pants     Lower body assist Assist for lower body dressing: Maximal Assistance - Patient 25 - 49%     Toileting Toileting    Toileting assist Assist for toileting: Moderate Assistance - Patient 50 - 74%     Transfers Chair/bed transfer  Transfers assist     Chair/bed transfer assist level: Moderate Assistance - Patient 50 - 74%     Locomotion Ambulation   Ambulation assist   Ambulation activity did not occur: Safety/medical concerns  Assist level: 2 helpers Assistive device: Ethelene Hal Max distance: 231ft   Walk 10 feet activity   Assist  Walk 10 feet activity did not occur: Safety/medical concerns  Assist level: 2 helpers Assistive device: Walker-Eva   Walk 50 feet activity   Assist Walk 50  feet with 2 turns activity did not occur: Safety/medical concerns  Assist level: 2 helpers Assistive device: Walker-Eva    Walk 150 feet activity   Assist Walk 150 feet activity did not occur: Safety/medical concerns  Assist level: 2 helpers Assistive device: Walker-Eva    Walk 10 feet on uneven surface  activity   Assist Walk 10 feet on uneven surfaces activity did not occur: Safety/medical concerns         Wheelchair     Assist Is the patient using a wheelchair?: Yes Type of  Wheelchair: Manual    Wheelchair assist level: Supervision/Verbal cueing Max wheelchair distance: 24ft    Wheelchair 50 feet with 2 turns activity    Assist        Assist Level: Dependent - Patient 0%   Wheelchair 150 feet activity     Assist      Assist Level: Dependent - Patient 0%    Medical Problem List and Plan: 1.  Left-sided hemiparesis secondary to infarct right posterior lentiform nucleus, thalamus, and tail of the caudate with additional punctate infarct in the lateral left thalamus.   Continue CIR ELOS 25 days; encouraged regarding his excellent progress and improvements in his strength! Con't PT and OT/CIR 2.  Impaired mobility: -DVT/anticoagulation:  Pharmaceutical: Continue Heparin  10/29- suggest team changes to Lovenox if OK?             -antiplatelet therapy: Aspirin 81 mg daily and Plavix 75 mg daily until 10/30, then Plavix alone. 3. Low back pain: Improved. Add kpad. Decrease Lyrica to 25 mg 2 times daily, discontinue hydrocodone  10/29- pain doing better with voltaren gel- con't regimen             Monitor with increased exertion 4. Anxiety: Decrease Klonopin 0.125 mg TID times daily, Prozac 20 mg daily             -antipsychotic agents: N/A 5. Neuropsych: This patient is capable of making decisions on his own behalf. 6. Skin/Wound Care: Routine skin checks 7. Fluids/Electrolytes/Nutrition: Routine in and outs 8.  Hypertension.  Norvasc 5 mg daily, Toprol-XL 25 mg daily.    Blood pressure is labile, but elevated, as needed hydralazine added on 10/16  Increase Norvasc to 10mg .   Increase hydralazine to 100 mg TID- monitor effect  10/29- just increased Hydralazine- overall better, so will con't regimen            Monitor with increased mobility 9.  Hyperlipidemia.  Zocor/Zetia 10.  COPD with history of tobacco use.  Continue nebulizers as directed as well as Singulair.  Check oxygen saturations every shift 11.  Diabetes mellitus with  hyperglycemia.  Hemoglobin A1c 6.7.  Continue Amaryl 0.5 mg twice daily  Elevated on 10/16, will consider medication adjustments if persistent  Provide dietary education- recommended avoiding added sugar in drinks and meals             Monitor with increased mobility 12.  Right side parotid mass.  Follow-up outpatient ENT 13.  CKD stage III.  Baseline creatinine 1.1-1.3.               Cr up to 1.41- place nursing order to encourage 6-8 glasses of water per day and creatinine has normalized to 1.10, monitor weekly.  Repeat in am  14.  GERD.  Continue Protonix 15. Right breat toe bunion: requested ortho tech eval for orthotic. Discussed benefits of icing.  15. Bradycardia: decrease lopressor to 12.5mg  Vitals:   08/17/21  0746 08/17/21 0749  BP: (!) 147/59 (!) 147/59  Pulse: 67 67  Resp:    Temp:    SpO2:  98%   16. Right CMC arthritis: discussed bracing and he defers at this time. Added voltaren gel QID 17. Disturbed sleep  10/29- will try Traozdone for sleep- to help him sleep through- 50 mg QHS for now 18. Left foot swelling: placed nursing order to request elevated and ice 15 minutes three times per day, avoid pressure garments as he feels this worsened swelling. Vascular Ultrasound ordered.     LOS: 17 days A FACE TO FACE EVALUATION WAS PERFORMED  Barry [name] 08/17/2021, 10:34 AM

## 2021-08-17 NOTE — Progress Notes (Signed)
Physical Therapy Session Note  Patient Details  Name: Barry [name] MRN: [id number] Date of Birth: [birth year]  Today's Date: 08/17/2021 PT Individual Time: 2552-5894 PT Individual Time Calculation (min): 45 min  and Today's Date: 08/17/2021 PT Missed Time: 15 Minutes Missed Time Reason: Patient fatigue  Short Term Goals: Week 2:  PT Short Term Goal 1 (Week 2): Pt will complete bed mobility with minA PT Short Term Goal 1 - Progress (Week 2): Met PT Short Term Goal 2 (Week 2): Pt will complete bed<>chair transfers with minA and LRAD PT Short Term Goal 2 - Progress (Week 2): Partly met PT Short Term Goal 3 (Week 2): Pt will ambulate 50f with minA and LRAD PT Short Term Goal 3 - Progress (Week 2): Partly met Week 3:  PT Short Term Goal 1 (Week 3): STG = LTG due to ELOS  Skilled Therapeutic Interventions/Progress Updates:    Patient received sitting up in bed, agreeable to PT. He denies pain, but reports feeling "just okay" and reports swelling in L ankle. PT observed +2 pitting edema in L ankle and K-tape applied to it to assist with edema management. Patient declining further gait training, but agreeable to seated exercise. He also reports increased anxiety last night resulting in him not sleeping and not feeling well today. Patient coming to sit edge of bed with CGA. Transferring to wc via stand pivot with ModA and increased time for motor planning + explicit verbal cues for weight shifting/stepping. PT transporting patient in wc to therapy gym for time management and energy conservation. Patient completed the following therex with 3# ankle weight: LAQ, HSC, DF/PF. Patient with difficulty motor planning LAQ resulting in a bike-pedaling motion instead of strict knee extend/flex. Patient then completing UE therex with 3# dowel: chest press, shoulder press, truncal rotation. Patient propelling himself in wc with B UE with increased cues to attend to L visual field to avoid obstacles. Patient  returning to bed via stand pivot with ModA and increased time/cuing for motor planning. Remaining up in bed, bed alarm on, call light within reach, ice applied to L ankle.   Therapy Documentation Precautions:  Precautions Precautions: Fall Precaution Comments: L hemi, L inattention, HOH Restrictions Weight Bearing Restrictions: No    Therapy/Group: Individual Therapy  JKaroline Caldwell PT, DPT, CBIS  08/17/2021, 7:47 AM

## 2021-08-17 NOTE — Progress Notes (Signed)
Speech Language Pathology Daily Session Note  Patient Details  Name: Barry [name] MRN: [id number] Date of Birth: 05-16-1933  Today's Date: 08/17/2021 SLP Individual Time: 0800-0900 SLP Individual Time Calculation (min): 60 min  Short Term Goals: Week 3: SLP Short Term Goal 1 (Week 3): Patient will demonstrate awareness to errors and difficulties during ADL tasks with minA verbal cues. SLP Short Term Goal 2 (Week 3): Patient will be able to demonstrate recall and understanding after reading and discussion of stroke and other health related topics, with minA verbal cues. SLP Short Term Goal 3 (Week 3): After initial instructions and demonstration, patient will be able to perform novel cognitive tasks/games/exercises with minA verbal and visual cues for accuracy. SLP Short Term Goal 4 (Week 3): Patient will demonstrate adequate anticipatory awareness by describing at least 4 ADL's that he will need assistance with as well as what type of assistance he will require, with modA verbal cues.  Skilled Therapeutic Interventions:   Pt was seen, sitting up in bed, in room for ST therapy targeting cognition. Upon entering room, pt shared with SLP that he did not sleep well due to worrying about his foot swelling. MD rounded on patient this morning and is addressing. SLP asked pt what he had been working on in speech therapy. Pt said, "Well, to be honest, I don't know what speech therapy does." SLP edu pt on the purpose of speech therapy (related to him). Pt was much more accepting of therapist and reported, "well, I am having trouble with some thinking skills." SLP was able to complete SLUMS this date. Pt scored a 10/30 with areas of impairment in attention, memory (immediate, working, and [employer]), and executive functioning (clock drawing). After assessment, pt did demonstrate awareness of the difficulty he had completing assessment - specifically, recall of information. Pt was left in room, bed alarm in  place, and all immediate needs in reach.   Pain Pain Assessment Pain Scale: Faces Pain Score: 0-No pain Faces Pain Scale: No hurt  Therapy/Group: Individual Therapy  Verdene Lennert MS, CCC-SLP, CBIS  08/17/2021, 9:36 AM

## 2021-08-18 ENCOUNTER — Ambulatory Visit (HOSPITAL_COMMUNITY): Payer: Medicare Other | Attending: Physical Medicine and Rehabilitation

## 2021-08-18 DIAGNOSIS — R600 Localized edema: Secondary | ICD-10-CM | POA: Insufficient documentation

## 2021-08-18 DIAGNOSIS — R609 Edema, unspecified: Secondary | ICD-10-CM

## 2021-08-18 LAB — GLUCOSE, CAPILLARY
Glucose-Capillary: 130 mg/dL — ABNORMAL HIGH (ref 70–99)
Glucose-Capillary: 167 mg/dL — ABNORMAL HIGH (ref 70–99)
Glucose-Capillary: 160 mg/dL — ABNORMAL HIGH (ref 70–99)
Glucose-Capillary: 146 mg/dL — ABNORMAL HIGH (ref 70–99)

## 2021-08-18 MED ORDER — ENOXAPARIN SODIUM 40 MG/0.4ML IJ SOSY
40.0000 mg | PREFILLED_SYRINGE | INTRAMUSCULAR | Status: DC
  Administered 2021-08-19 – 2021-08-26 (×8): 40 mg via SUBCUTANEOUS
  Filled 2021-08-18 (×8): qty 0.4

## 2021-08-18 MED ORDER — CLONAZEPAM 0.125 MG PO TBDP
0.1250 mg | ORAL_TABLET | Freq: Three times a day (TID) | ORAL | Status: DC
  Administered 2021-08-18 – 2021-08-21 (×8): 0.125 mg via ORAL
  Filled 2021-08-18 (×8): qty 1

## 2021-08-18 MED ORDER — CLONAZEPAM 0.25 MG PO TBDP
0.2500 mg | ORAL_TABLET | Freq: Every day | ORAL | Status: DC
  Administered 2021-08-18 – 2021-08-20 (×3): 0.25 mg via ORAL
  Filled 2021-08-18 (×3): qty 1

## 2021-08-18 NOTE — Progress Notes (Signed)
Lower extremity venous LT study completed.   Please see CV Proc for preliminary results.   Whyatt Klinger, RDMS, RVT  

## 2021-08-18 NOTE — Progress Notes (Signed)
Patient ID: Barry [name], male   DOB: [birth year], 87 y.o.   MRN: [id number] Spoke with son who reports his PT was in the am and the afternoon looks like SP is at 2:00. Will need to re-schedule observation he needs to see PT for the physical care part and son reports he is short handed at work so not a good day today or tomorrow. Will call after team tomorrow to re-schedule coming in. Unsure if son will be able to provide the assist pt will need at discharge, has not hired assist yet.

## 2021-08-18 NOTE — Progress Notes (Signed)
Physical Therapy Session Note  Patient Details  Name: Barry [name] MRN: [id number] Date of Birth: 1933-07-13  Today's Date: 08/18/2021 PT Individual Time: 1431-1500 PT Individual Time Calculation (min): 29 min   Short Term Goals: Week 2:  PT Short Term Goal 1 (Week 2): Pt will complete bed mobility with minA PT Short Term Goal 1 - Progress (Week 2): Met PT Short Term Goal 2 (Week 2): Pt will complete bed<>chair transfers with minA and LRAD PT Short Term Goal 2 - Progress (Week 2): Partly met PT Short Term Goal 3 (Week 2): Pt will ambulate 63f with minA and LRAD PT Short Term Goal 3 - Progress (Week 2): Partly met Week 3:  PT Short Term Goal 1 (Week 3): STG = LTG due to ELOS  Skilled Therapeutic Interventions/Progress Updates:  Patient seated in w/c with PT at hi/lo table in Day Room at beginning of co-treat session. Patient alert and agreeable to include PT into session.   Patient with no pain complaint throughout session, but does relate that his L leg feels very weak today.   Therapeutic Activity: Transfers: Patient performed sit<>stand to hi-lo table and is able to use BUE to maintain with no instance of L knee buckle. Pt is able to maintain balance at table with CGA from PT and OT. He is also able to lift RUE to touch various parts of body and maintain balance. Guard provided to L knee but not needed. Provided verbal cues for maintaining BLE knee extension and upright posture.  Neuromuscular Re-ed: NMR facilitated during session with focus on standing balance. Pt guided in standing while participating in ball toss with OT. First standing bout with steady minA provided at Bil hips and pt demonstrating post bias requiring correction. Second bout longer and pt demos improved balance/ performance with ball toss. Noted ankle strategy with intermittent post bias noted with pt's toes pulling upward. Pt able to self correct and maintain throughout with CGA. NMR performed for improvements  in motor control and coordination, balance, sequencing, judgement, and self confidence/ efficacy in performing all aspects of mobility at highest level of independence.   Patient seated upright  in w/c at end of session with brakes locked, belt alarm set, and all needs within reach.     Therapy Documentation Precautions:  Precautions Precautions: Fall Precaution Comments: L hemi, L inattention, HOH Restrictions Weight Bearing Restrictions: No General:   Pain: Pain Assessment Pain Scale: 0-10 Pain Score: 0-No pain  Therapy/Group: Individual Therapy  JAlger SimonsPT, DPT 08/18/2021, 4:58 PM

## 2021-08-18 NOTE — Progress Notes (Signed)
Physical Therapy Session Note  Patient Details  Name: Barry [name] MRN: [id number] Date of Birth: 08-02-1933  Today's Date: 08/18/2021 PT Individual Time: 0800-0858 PT Individual Time Calculation (min): 58 min   Short Term Goals: Week 3:  PT Short Term Goal 1 (Week 3): STG = LTG due to ELOS  Skilled Therapeutic Interventions/Progress Updates:     Pt seated in w/c at start of session - changing out of his dirty t-shirt into a clean one. Completed without assist or cues. Able to doff hospital socks without assist via figure-4 technique. Donned new socks and shoes with totalA for time.   Transported pt to ortho rehab gym in w/c and setup with BITS system to work on [employer] for standing balance and GMC/FMC for paretic LUE. Completed a variety of tasks including user paced target reaching, tracing simple letters/lines, and trail making #1-#25. He requires minA for sit<>Stand to RW and a heavy modA for static standing balance due to strong L lean. He continues to require modA for standing balance while completing these tasks and requires mod/max verbal cues for initiating tall standing due to soft knees. Standing tasks lasted ~3-5 minutes for each. Pt disappointed in his performance for both standing and LUE tasks, requires a lot of encouragement and motivation to continue working hard during his stroke recovery.   Worked on pre-gait and seated there-ex for remainder of session. Completed forward/backward unilateral stepping with RW support and modA for balance, using both visual aid with mirror and visual targets for foot placement. Continues to show strong L lateral lean with inability to maintain but ability to briefly correct. Seated there-ex included LAQ and hip marches with 4# ankle weight - AAROM for hip marches and TC's for motor planning for LAQ.  Pt returned to his room at end of session, remained seated in w/c with chair alarm on, all needs in reach.   Therapy Documentation Precautions:   Precautions Precautions: Fall Precaution Comments: L hemi, L inattention, HOH Restrictions Weight Bearing Restrictions: No General:    Therapy/Group: Individual Therapy  [name] 08/18/2021, 7:34 AM

## 2021-08-18 NOTE — Progress Notes (Signed)
Occupational Therapy Session Note  Patient Details  Name: Barry [name] MRN: [id number] Date of Birth: 1933/05/14  Today's Date: 08/18/2021 OT Individual Time: 1005-1105 OT Individual Time Calculation (min): 60 min    Short Term Goals: Week 2:  OT Short Term Goal 1 (Week 2): Patient will complete sit to stand transfers with Mod A and LRAD. OT Short Term Goal 2 (Week 2): Patient with complete toilet transfers with Mod A and LRAD. OT Short Term Goal 3 (Week 2): Patient will complete LB dressing with Mod A in sitting/standing with hemi technique and LRAD. OT Short Term Goal 4 (Week 2): Patient will complete 2/3 parts of toileting task with Mod A in sitting/standing with good safety awareness and LRAD.    Skilled Therapeutic Interventions/Progress Updates:    Pt sitting up in w/c, reports feeling down emotionally.  Also complains of pain in left shoulder and soreness LLE.  Hesitant to complete any mobility however with therapeutic use of self and encouragement pt willing participate. Donned jacket with setup.  Transported to [place] to improve mood and pt participation.  Pt instructed through upper traps and levator scapulae stretch x 10 each with 10 second holds.  Pt reports decreased pain in left shoulder thereafter.  Pt then participated in sit<>stand blocked practice and static/dynamic standing activity.  Pt required min assist to maintain static balance and to mod assist for dynamic balance at RW.  With question cues provided pt able to correct leftward lean briefly.  Sit<>stands requiring min assist and multimodal cues for safe hand placement and foot placement.  Pt transported back to room and donned/doffed shoes with min assist to support LE's into figure 4, pt able to tie/untie shoes without assist.  Practiced buttoning up shirt using button aide and pt able to complete with max Vcs fading to min Vcs. Call bell in reach, seat alarm on.     Therapy Documentation Precautions:   Precautions Precautions: Fall Precaution Comments: L hemi, L inattention, HOH Restrictions Weight Bearing Restrictions: No   Therapy/Group: Individual Therapy  [name] 08/18/2021, 12:58 PM

## 2021-08-18 NOTE — Progress Notes (Signed)
Occupational Therapy Session Note  Patient Details  Name: Barry [name] MRN: [id number] Date of Birth: 06-19-1933  Today's Date: 08/18/2021 OT Individual Time: 1402-1430 OT Individual Time Calculation (min): 28 min    Short Term Goals: Week 2:  OT Short Term Goal 1 (Week 2): Patient will complete sit to stand transfers with Mod A and LRAD. OT Short Term Goal 2 (Week 2): Patient with complete toilet transfers with Mod A and LRAD. OT Short Term Goal 3 (Week 2): Patient will complete LB dressing with Mod A in sitting/standing with hemi technique and LRAD. OT Short Term Goal 4 (Week 2): Patient will complete 2/3 parts of toileting task with Mod A in sitting/standing with good safety awareness and LRAD.  Skilled Therapeutic Interventions/Progress Updates:    Pt greeted sitting in wc and agreeable to OT treatment session. Pt reported feeling tired today, but willing to work with OT. Pt brought to therapy gym and worked on L UE motor control with tracing activity on BITS. Visual scanning in all 4 quadrants using alternating UE's to tap buttons. Memory activity on Bits with pt needed min cues for recall of up to 5 words in a row. Problem solving and fine motor coordination with shuffle puzzle. Min cues initially, then was able to recall directions and complete 2 more puzzles without cues. PT joined for [place] remainder of session. PT provided mod/max A for dynamic balance while completing UB coordination and hand eye coordination with ball toss. PT facilitated weight shifting and balance. Pt returned to room and left seated in wc with alarm belt on, call bell in reach, and needs met.    Therapy Documentation Precautions:  Precautions Precautions: Fall Precaution Comments: L hemi, L inattention, HOH Restrictions Weight Bearing Restrictions: No Pain:  Denies pain  Therapy/Group: Individual Therapy  [name] 08/18/2021, 3:19 PM

## 2021-08-18 NOTE — Progress Notes (Signed)
[place] PHYSICAL MEDICINE & REHABILITATION PROGRESS NOTE  Subjective/Complaints: Complaints of nightmares and feeling hot and cold at night. Will restart his Klonopin at night, he is agreeable. Discussed getting a fan at home to help with temperature modulaiton  ROS:  Pt denies SOB, abd pain, CP, N/V/C/D, and vision changes, +left foot swelling, +left shoulder pain   Objective: Vital Signs: Blood pressure (!) 163/73, pulse 64, temperature 97.9 F (36.6 C), resp. rate 20, height 5\' 11"  (1.803 m), weight 74.2 kg, SpO2 97 %. No results found. No results for input(s): WBC, HGB, HCT, PLT in the last 72 hours.  No results for input(s): NA, K, CL, CO2, GLUCOSE, BUN, CREATININE, CALCIUM in the last 72 hours.    Intake/Output Summary (Last 24 hours) at 08/18/2021 1323 Last data filed at 08/18/2021 0820 Gross per 24 hour  Intake 716 ml  Output 1250 ml  Net -534 ml        Physical Exam: BP (!) 163/73 (BP Location: Left Arm)   Pulse 64   Temp 97.9 F (36.6 C)   Resp 20   Ht 5\' 11"  (1.803 m)   Wt 74.2 kg   SpO2 97%   BMI 22.81 kg/m  Gen: no distress, normal appearing HEENT: oral mucosa pink and moist, NCAT Cardio: Reg rate Chest: normal effort, normal rate of breathing Abd: soft, non-distended Ext: no edema Psych: pleasant, normal affect  Musculoskeletal: much less TTP over right thumb CMC joint, -no swelling; no erythema Left shoulder subluxation Neuro: Makes eye contact with examiner.   Follows commands.   Name and age.   He does have some mild delay in processing. Difficulty with medication management.  Dysarthria, unchanged Motor: RUE/RLE: 4/5 proximal distal LUE: 4-/5 proximal distal LLE: 3/5 proximal 2/5 distal,  Dysmetria LUE and LLE  Assessment/Plan: 1. Functional deficits which require 3+ hours per day of interdisciplinary therapy in a comprehensive inpatient rehab setting. Physiatrist is providing close team supervision and 24 hour management of active  medical problems listed below. Physiatrist and rehab team continue to assess barriers to discharge/monitor patient progress toward functional and medical goals   Care Tool:  Bathing    Body parts bathed by patient: Right arm, Left arm, Chest, Abdomen, Front perineal area, Right upper leg, Left upper leg, Face   Body parts bathed by helper: Buttocks, Right lower leg, Left lower leg     Bathing assist Assist Level: Moderate Assistance - Patient 50 - 74%     Upper Body Dressing/Undressing Upper body dressing   What is the patient wearing?: Pull over shirt    Upper body assist Assist Level: Minimal Assistance - Patient > 75%    Lower Body Dressing/Undressing Lower body dressing      What is the patient wearing?: Pants     Lower body assist Assist for lower body dressing: Maximal Assistance - Patient 25 - 49%     Toileting Toileting    Toileting assist Assist for toileting: Moderate Assistance - Patient 50 - 74%     Transfers Chair/bed transfer  Transfers assist     Chair/bed transfer assist level: Moderate Assistance - Patient 50 - 74%     Locomotion Ambulation   Ambulation assist   Ambulation activity did not occur: Safety/medical concerns  Assist level: 2 helpers Assistive device: Ethelene Hal Max distance: 227ft   Walk 10 feet activity   Assist  Walk 10 feet activity did not occur: Safety/medical concerns  Assist level: 2 helpers Assistive device: [employer]  Walk 50 feet activity   Assist Walk 50 feet with 2 turns activity did not occur: Safety/medical concerns  Assist level: 2 helpers Assistive device: Walker-Eva    Walk 150 feet activity   Assist Walk 150 feet activity did not occur: Safety/medical concerns  Assist level: 2 helpers Assistive device: Walker-Eva    Walk 10 feet on uneven surface  activity   Assist Walk 10 feet on uneven surfaces activity did not occur: Safety/medical concerns          Wheelchair     Assist Is the patient using a wheelchair?: Yes Type of Wheelchair: Manual    Wheelchair assist level: Supervision/Verbal cueing Max wheelchair distance: 10ft    Wheelchair 50 feet with 2 turns activity    Assist        Assist Level: Dependent - Patient 0%   Wheelchair 150 feet activity     Assist      Assist Level: Dependent - Patient 0%    Medical Problem List and Plan: 1.  Left-sided hemiparesis secondary to infarct right posterior lentiform nucleus, thalamus, and tail of the caudate with additional punctate infarct in the lateral left thalamus.   Continue CIR ELOS 25 days; encouraged regarding his excellent progress and improvements in his strength! 2.  Impaired mobility: -DVT/anticoagulation:  Pharmaceutical: Change heparin to Lovenox             -antiplatelet therapy: Aspirin 81 mg daily and Plavix 75 mg daily until 10/30, then Plavix alone. 3. Low back pain: Improved. Add kpad. Decrease Lyrica to 25 mg 2 times daily, discontinue hydrocodone  10/29- pain doing better with voltaren gel- con't regimen             Monitor with increased exertion 4. Anxiety: Decrease Klonopin 0.125 mg TID times daily, Prozac 20 mg daily, add klonopin 0.25mg  HS.              -antipsychotic agents: N/A 5. Neuropsych: This patient is capable of making decisions on his own behalf. 6. Skin/Wound Care: Routine skin checks 7. Fluids/Electrolytes/Nutrition: Routine in and outs 8.  Hypertension.  Norvasc 5 mg daily, Toprol-XL 25 mg daily.    Blood pressure is labile, but elevated, as needed hydralazine added on 10/16  Increase Norvasc to 10mg .   Increase hydralazine to 100 mg TID- monitor effect  Add back HS klonopin which will help with anxiety and BP            Monitor with increased mobility 9.  Hyperlipidemia.  Zocor/Zetia 10.  COPD with history of tobacco use.  Continue nebulizers as directed as well as Singulair.  Check oxygen saturations every shift 11.   Diabetes mellitus with hyperglycemia.  Hemoglobin A1c 6.7.  Continue Amaryl 0.5 mg twice daily  Elevated on 10/16, will consider medication adjustments if persistent  Provide dietary education- recommended avoiding added sugar in drinks and meals             Monitor with increased mobility 12.  Right side parotid mass.  Follow-up outpatient ENT 13.  CKD stage III.  Baseline creatinine 1.1-1.3.               Cr up to 1.41- place nursing order to encourage 6-8 glasses of water per day and creatinine has normalized to 1.10, monitor weekly.  Repeat in am  14.  GERD.  Continue Protonix 15. Right breat toe bunion: requested ortho tech eval for orthotic. Discussed benefits of icing.  15. Bradycardia: decrease lopressor to 12.5mg   Vitals:   08/18/21 0517 08/18/21 1323  BP: (!) 157/74 (!) 163/73  Pulse: 68 64  Resp: 14 20  Temp: 98.4 F (36.9 C) 97.9 F (36.6 C)  SpO2: 98% 97%   16. Right CMC arthritis: discussed bracing and he defers at this time. Added voltaren gel QID 17. Disturbed sleep  10/29- will try Traozdone for sleep- to help him sleep through- 50 mg QHS for now 18. Left foot swelling: placed nursing order to request elevated and ice 15 minutes three times per day, avoid pressure garments as he feels this worsened swelling. Vascular Ultrasound ordered- not yet performed    LOS: 18 days A FACE TO FACE EVALUATION WAS PERFORMED  Harlan Ervine P Amerika Nourse 08/18/2021, 1:23 PM

## 2021-08-18 NOTE — Progress Notes (Signed)
Speech Language Pathology Daily Session Note  Patient Details  Name: Barry [name] MRN: [id number] Date of Birth: [birth year]  Today's Date: 08/18/2021 SLP Individual Time: 1500-1530 SLP Individual Time Calculation (min): 30 min  Short Term Goals: Week 3: SLP Short Term Goal 1 (Week 3): Patient will demonstrate awareness to errors and difficulties during ADL tasks with minA verbal cues. SLP Short Term Goal 2 (Week 3): Patient will be able to demonstrate recall and understanding after reading and discussion of stroke and other health related topics, with minA verbal cues. SLP Short Term Goal 3 (Week 3): After initial instructions and demonstration, patient will be able to perform novel cognitive tasks/games/exercises with minA verbal and visual cues for accuracy. SLP Short Term Goal 4 (Week 3): Patient will demonstrate adequate anticipatory awareness by describing at least 4 ADL's that he will need assistance with as well as what type of assistance he will require, with modA verbal cues.  Skilled Therapeutic Interventions: Pt received upright in wheelchair and agreeable to skilled ST intervention with focus on cognitive goals. SLP facilitated session by providing min A verbal cues for anticipatory awareness/problem solving for level of assistance anticipated at home, and safety considerations/precautions. Pt displayed intellectual and emergent awareness of physical deficits by describing current physical limitations may impact his safety with bathing, dressing (while standing), getting to/from bathroom, cooking, and yard work. Pt optimistic for further improvement and endorsed he will receive assist from his son at discharge. Pt reported he often worries about his blood pressure and is concerned that worrying about it elevates his blood pressure even more. SLP provided demonstration and education on various deep breathing exercises for stress management and mindfulness. Pt returned demonstration on  2 breathing exercises (inhale for 2 sec, exhale for 3 sec, and stair steps technique) with sup A verbal cues for accuracy. Pt displayed positive reaction toward breathing techniques and plans to implement prior to staff taking his blood pressure. Pt was transferred to bed with min-to-mod A at end of session with alarm activated and immediate needs within reach at end of session. Continue per current plan of care.      Pain Pain Assessment Pain Scale: 0-10 Pain Score: 0-No pain  Therapy/Group: Individual Therapy  Jhana Giarratano T Tersa Fotopoulos 08/18/2021, 4:20 PM

## 2021-08-19 LAB — GLUCOSE, CAPILLARY
Glucose-Capillary: 127 mg/dL — ABNORMAL HIGH (ref 70–99)
Glucose-Capillary: 126 mg/dL — ABNORMAL HIGH (ref 70–99)
Glucose-Capillary: 155 mg/dL — ABNORMAL HIGH (ref 70–99)
Glucose-Capillary: 162 mg/dL — ABNORMAL HIGH (ref 70–99)

## 2021-08-19 MED ORDER — FUROSEMIDE 20 MG PO TABS
10.0000 mg | ORAL_TABLET | Freq: Once | ORAL | Status: AC
  Administered 2021-08-19: 10 mg via ORAL
  Filled 2021-08-19: qty 1

## 2021-08-19 MED ORDER — PREGABALIN 25 MG PO CAPS: 25.0000 mg | ORAL_CAPSULE | Freq: Every day | ORAL | Status: DC

## 2021-08-19 NOTE — Progress Notes (Addendum)
Occupational Therapy Weekly Progress Note  Patient Details  Name: Barry [name] MRN: [id number] Date of Birth: [birth year]  Beginning of progress report period: August 10, 2021 End of progress report period: August 19, 2021  Today's Date: 08/19/2021 OT Individual Time: 1330-1430 OT Individual Time Calculation (min): 60 min    Patient has met 3 of 3 short term goals.  Pt was progressing steadily towards long term goals however progress has slowed down in the past 4 days due to pt appearing internally distracted regarding current functional status with associated low mood. Barriers to progress also include impaired cognition including short term memory, problem solving, and emergent awareness as well as left neglect which hinders carryover of skilled training from one session to the next.  Pt also exhibits mild ataxia in proximal LUE and significant impairments in motor control and proprioception of LLE.  His left/posterior lean while sitting and during static standing has improved a great deal resulting in increased independence during UB self care tasks. However when adding a functional task with increased divided attention demands, pt leans posteriorly and to the left needing mod assist to maintain balance and return to midline.  Pt is now setup-supervision for UB self care at w/c level and mod-max assist for LB self care at sit<>stand level primarily due to impairments in balance and LLE motor control/strength.  Pt will benefit from further skilled OT to maximize independence and safety during ADLs and functional mobility and reduce caregiver burden.    Patient continues to demonstrate the following deficits: muscle weakness, decreased cardiorespiratoy endurance, impaired timing and sequencing, motor apraxia, ataxia, decreased coordination, and decreased motor planning, left side neglect and decreased motor planning, decreased attention, decreased awareness, decreased problem solving, decreased  safety awareness, decreased memory, and delayed processing, and decreased sitting balance, decreased standing balance, decreased postural control, hemiplegia, and decreased balance strategies and therefore will continue to benefit from skilled OT intervention to enhance overall performance with BADL.  Patient progressing toward long term goals..  Continue plan of care.  OT Short Term Goals Week 2:  OT Short Term Goal 1 (Week 2): Patient will complete sit to stand transfers with Mod A and LRAD. OT Short Term Goal 2 (Week 2): Patient with complete toilet transfers with Mod A and LRAD. OT Short Term Goal 2 - Progress (Week 2): Met OT Short Term Goal 3 (Week 2): Patient will complete LB dressing with Mod A in sitting/standing with hemi technique and LRAD. OT Short Term Goal 3 - Progress (Week 2): Met OT Short Term Goal 4 (Week 2): Patient will complete 2/3 parts of toileting task with Mod A in sitting/standing with good safety awareness and LRAD. OT Short Term Goal 4 - Progress (Week 2): Met Week 3:  OT Short Term Goal 1 (Week 3): STGs=LTGs due to ELOS  Skilled Therapeutic Interventions/Progress Updates:    Pt sitting up in w/c, perseverating throughout session on feeling disappointed in himself because he feels his left leg is weaker today than it was when he first got here.  Required frequent cues and therapeutic use of self to redirect pt and facilitate increased participation.  Pt transported to day room and participated in static/dynamic standing neuro re-ed and therapeutic activity bowling which is a hobby pt used to partake in.  Pt required mod assist sit<>stand without AD and mod assist fading to intermittent min assist with visual feedback from mirror, tactile cues, and verbal cues to "put weight into toes and keep toes on ground"  to reduce posterior weight shifting.  After working on static standing and achieving min assist, pt rolled bowling ball towards pins with mod assist to maintain balance  x 5.  Pt completed 8 minutes on level 6 resistance using nustep per his request to increase strength and endurance level.  Stand pivot w/c<>nustep seat with mod assist and step by step multimodal cues for sequencing, RW management, and weight shifting.  Returned to room, call bell in reach, seat alarm on.  Therapy Documentation Precautions:  Precautions Precautions: Fall Precaution Comments: L hemi, L inattention, HOH Restrictions Weight Bearing Restrictions: No   Therapy/Group: Individual Therapy  Barry [name] 08/19/2021, 2:36 PM

## 2021-08-19 NOTE — Patient Care Conference (Addendum)
Inpatient RehabilitationTeam Conference and Plan of Care Update Date: 08/19/2021   Time: 11:16 AM    Patient Name: Barry [name] [facility]      Medical Record Number: [id number]  Date of Birth: 1933/10/17 Sex: Male         Room/Bed: 4M01C/4M01C-01 Payor Info: Payor: MEDICARE / Plan: MEDICARE PART A AND B / Product Type: *No Product type* /    Admit Date/Time:  07/31/2021 12:34 PM  Primary Diagnosis:  Right thalamic infarction [facility])  Hospital Problems: Principal Problem:   Right thalamic infarction [facility])    Expected Discharge Date: Expected Discharge Date: 08/25/21  Team Members Present: Physician leading conference: Dr. Leeroy Cha Social Worker Present: Ovidio Kin, LCSW Nurse Present: Dorien Chihuahua, RN PT Present: Ginnie Smart, PT OT Present: Leretha Pol, OT SLP Present: Sherren Kerns, SLP PPS Coordinator present : Gunnar Fusi, SLP     Current Status/Progress Goal Weekly Team Focus  Bowel/Bladder   continent of b.b; LBM: 11/01  remain continent of b/b  assist with toileting needs prn   Swallow/Nutrition/ Hydration             ADL's   UB self care setup; LB self care min-mod assist; functional transfers min-mod A  minA  functional transfers, postural control, neuro re-ed, self care retraining   Mobility   Still requires modA for bed mobility and squat<>pivot transfers. minA for sit<>stand transfers, gait ~142ft with +2 assist with RW. L inattention  minA overall  LLE NMR, gait training, functional transfers, safety awareness, begin DC planning   Communication             Safety/Cognition/ Behavioral Observations  min A  sup A  emergent awareness, functionl recall of novel info with compensatory memory strategies, med/money management, error awareness   Pain   c/o mild pain; prn tylenol  pain level <4/10  assess pain level QS and prn   Skin   skin intact; external heart monitor to L chest  maintain skin integrity  assess skin QS and prn     Discharge  Planning:  Son was suppose to come in to observe but therapy schedule got switched so did not. Feel best option at this time is shoirt term SNF due to heavy care needs. Talking to both regarding this option   Team Discussion: Left foot pain and edema addressed; MD added lasix and ice. HTN meds adjusted and meds for anxiety at HS added which also helps with sleeping. Patient close to plateau for mobility with CMC arthritis.  Patient on target to meet rehab goals: Currently min - mod assit for stand pivot transfers and able to ambulate up to 140' with +2 Assist. Completes bathing at a wheelchair level with set up but has trouble with left leaning and trouble maintaining balance. Goals for discharge set for min assist overall.  *See Care Plan and progress notes for long and short-term goals.   Revisions to Treatment Plan:  Downgraded cognition goal to min assist  Downgraded goals for PT Button aide for self care assistance Neuro-re-education for left inattention and insight.  Teaching Needs: Safety, medication management, secondary risk management, transfers, toileting, etc.  Current Barriers to Discharge: Home enviroment access/layout and Lack of/limited family support  Possible Resolutions to Barriers: Family education with son Recommend SNF Recommend hired caregivers to maintain 24/7 assistance     Medical Summary Current Status: blood pressure still elevated, +anxiety and insomnia, concerned by how much medication he takes, Zio patch in place, left foot swelling  Barriers to Discharge Comments: blood pressure still elevated, denies pain or anxiety, concerned by how much medication he takes, Zio patch in place, left foot swelling and pain Possible Resolutions to [employer] Focus: add lasix 10mg  today, adjusted klonopin so he gets less during the day and more at night, vascular ultrasound reviewed with patient and is negative for clot   Continued Need for Acute Rehabilitation  Level of Care: The patient requires daily medical management by a physician with specialized training in physical medicine and rehabilitation for the following reasons: Direction of a multidisciplinary physical rehabilitation program to maximize functional independence : Yes Medical management of patient stability for increased activity during participation in an intensive rehabilitation regime.: Yes Analysis of laboratory values and/or radiology reports with any subsequent need for medication adjustment and/or medical intervention. : Yes   I attest that I was present, lead the team conference, and concur with the assessment and plan of the team.   Dorien Chihuahua B 08/19/2021, 3:10 PM

## 2021-08-19 NOTE — Progress Notes (Signed)
Patient ID: Barry [name], male   DOB: [birth year], 87 y.o.   MRN: [id number]  Met with pt and spoke with son via telephone to discuss team conference and the amount of care pt is requiring. Discussed looking at SNF option where he has coverage for more rehab and then from there look at going home. Son has yet to hire anyone. Discussed still looking at discharge for 11/8. Feel it is important for son to see pt in therapies to see the care he requires. Have re-scheduled him to come Friday form 1:00-3:00 to see him in therapies and then discuss best plan for him. Pt does not want to burden son and is aware of the care he requires he will talk with son regarding this when here tonight. Will prep paperwork for SNF and then work on once both have decided option for discharge.

## 2021-08-19 NOTE — Progress Notes (Signed)
[place] PHYSICAL MEDICINE & REHABILITATION PROGRESS NOTE  Subjective/Complaints: Left foot pain and swelling- discussed that there is no clot on ultrasound, keep elevating, lasix 10mg  today, ice 110minutes three times per day  ROS:  Pt denies SOB, abd pain, CP, N/V/C/D, and vision changes, +left foot swelling and pain, +left shoulder pain   Objective: Vital Signs: Blood pressure (!) 122/58, pulse 66, temperature 97.9 F (36.6 C), temperature source Oral, resp. rate 18, height 5\' 11"  (1.803 m), weight 74.2 kg, SpO2 98 %. VAS [place] LOWER EXTREMITY VENOUS (DVT)  Result Date: 08/18/2021  Lower Venous DVT Study Patient Name:  Barry [name] [facility]  Date of Exam:   08/18/2021 Medical Rec #: [id number]          Accession #:    [id number] Date of Birth: 10-20-1932         Patient Gender: M Patient Age:   87 years Exam Location:  [facility] Procedure:      VAS [place] LOWER EXTREMITY VENOUS (DVT) Referring Phys: Leeroy Cha --------------------------------------------------------------------------------  Indications: Left foot edema.  Comparison Study: No prior studies. Performing Technologist: Darlin Coco RDMS, RVT  Examination Guidelines: A complete evaluation includes B-mode imaging, spectral Doppler, color Doppler, and power Doppler as needed of all accessible portions of each vessel. Bilateral testing is considered an integral part of a complete examination. Limited examinations for reoccurring indications may be performed as noted. The reflux portion of the exam is performed with the patient in reverse Trendelenburg.  +-----+---------------+---------+-----------+----------+--------------+ RIGHTCompressibilityPhasicitySpontaneityPropertiesThrombus Aging +-----+---------------+---------+-----------+----------+--------------+ CFV  Full           Yes      Yes                                 +-----+---------------+---------+-----------+----------+--------------+    +---------+---------------+---------+-----------+----------+--------------+ LEFT     CompressibilityPhasicitySpontaneityPropertiesThrombus Aging +---------+---------------+---------+-----------+----------+--------------+ CFV      Full           Yes      Yes                                 +---------+---------------+---------+-----------+----------+--------------+ SFJ      Full                                                        +---------+---------------+---------+-----------+----------+--------------+ FV Prox  Full                                                        +---------+---------------+---------+-----------+----------+--------------+ FV Mid   Full                                                        +---------+---------------+---------+-----------+----------+--------------+ FV DistalFull                                                        +---------+---------------+---------+-----------+----------+--------------+  PFV      Full                                                        +---------+---------------+---------+-----------+----------+--------------+ POP      Full           Yes      Yes                                 +---------+---------------+---------+-----------+----------+--------------+ PTV      Full                                                        +---------+---------------+---------+-----------+----------+--------------+ PERO     Full                                                        +---------+---------------+---------+-----------+----------+--------------+ Gastroc  Full                                                        +---------+---------------+---------+-----------+----------+--------------+    Summary: RIGHT: - No evidence of common femoral vein obstruction.  LEFT: - There is no evidence of deep vein thrombosis in the lower extremity.  - No cystic structure found in the popliteal fossa.   *See table(s) above for measurements and observations. Electronically signed by Deitra Mayo MD on 08/18/2021 at 5:06:24 PM.    Final    No results for input(s): WBC, HGB, HCT, PLT in the last 72 hours.  No results for input(s): NA, K, CL, CO2, GLUCOSE, BUN, CREATININE, CALCIUM in the last 72 hours.    Intake/Output Summary (Last 24 hours) at 08/19/2021 1116 Last data filed at 08/19/2021 0730 Gross per 24 hour  Intake 600 ml  Output 551 ml  Net 49 ml        Physical Exam: BP (!) 122/58 (BP Location: Left Arm)   Pulse 66   Temp 97.9 F (36.6 C) (Oral)   Resp 18   Ht 5\' 11"  (1.803 m)   Wt 74.2 kg   SpO2 98%   BMI 22.81 kg/m  Gen: no distress, normal appearing HEENT: oral mucosa pink and moist, NCAT Cardio: Reg rate Chest: normal effort, normal rate of breathing Abd: soft, non-distended Ext: no edema Psych: pleasant, normal affect Skin: intact Musculoskeletal: much less TTP over right thumb CMC joint, -no swelling; no erythema Left shoulder subluxation Left foot swelling Neuro: Makes eye contact with examiner.   Follows commands.   Name and age.   He does have some mild delay in processing. Difficulty with medication management.  Dysarthria, unchanged Motor: RUE/RLE: 4/5 proximal distal LUE: 4-/5 proximal distal LLE: 3/5 proximal 2/5 distal,  Dysmetria LUE and LLE  Assessment/Plan: 1. Functional deficits which require 3+ hours  per day of interdisciplinary therapy in a comprehensive inpatient rehab setting. Physiatrist is providing close team supervision and 24 hour management of active medical problems listed below. Physiatrist and rehab team continue to assess barriers to discharge/monitor patient progress toward functional and medical goals   Care Tool:  Bathing    Body parts bathed by patient: Right arm, Left arm, Chest, Abdomen, Front perineal area, Right upper leg, Left upper leg, Face   Body parts bathed by helper: Buttocks, Right lower leg, Left  lower leg     Bathing assist Assist Level: Moderate Assistance - Patient 50 - 74%     Upper Body Dressing/Undressing Upper body dressing   What is the patient wearing?: Pull over shirt    Upper body assist Assist Level: Minimal Assistance - Patient > 75%    Lower Body Dressing/Undressing Lower body dressing      What is the patient wearing?: Pants     Lower body assist Assist for lower body dressing: Maximal Assistance - Patient 25 - 49%     Toileting Toileting    Toileting assist Assist for toileting: Moderate Assistance - Patient 50 - 74%     Transfers Chair/bed transfer  Transfers assist     Chair/bed transfer assist level: Moderate Assistance - Patient 50 - 74%     Locomotion Ambulation   Ambulation assist   Ambulation activity did not occur: Safety/medical concerns  Assist level: 2 helpers Assistive device: Ethelene Hal Max distance: 273ft   Walk 10 feet activity   Assist  Walk 10 feet activity did not occur: Safety/medical concerns  Assist level: 2 helpers Assistive device: Walker-Eva   Walk 50 feet activity   Assist Walk 50 feet with 2 turns activity did not occur: Safety/medical concerns  Assist level: 2 helpers Assistive device: Walker-Eva    Walk 150 feet activity   Assist Walk 150 feet activity did not occur: Safety/medical concerns  Assist level: 2 helpers Assistive device: Walker-Eva    Walk 10 feet on uneven surface  activity   Assist Walk 10 feet on uneven surfaces activity did not occur: Safety/medical concerns         Wheelchair     Assist Is the patient using a wheelchair?: Yes Type of Wheelchair: Manual    Wheelchair assist level: Supervision/Verbal cueing Max wheelchair distance: 22ft    Wheelchair 50 feet with 2 turns activity    Assist        Assist Level: Dependent - Patient 0%   Wheelchair 150 feet activity     Assist      Assist Level: Dependent - Patient 0%    Medical  Problem List and Plan: 1.  Left-sided hemiparesis secondary to infarct right posterior lentiform nucleus, thalamus, and tail of the caudate with additional punctate infarct in the lateral left thalamus.   Continue CIR ELOS 25 days; encouraged regarding his excellent progress and improvements in his strength! 2.  Impaired mobility: -DVT/anticoagulation:  Pharmaceutical: Change heparin to Lovenox             -antiplatelet therapy: Aspirin 81 mg daily and Plavix 75 mg daily until 10/30, then Plavix alone. 3. Low back pain: Improved. Add kpad. Decrease Lyrica to 25mg  HS, discontinue hydrocodone             Monitor with increased exertion 4. Anxiety: Decrease Klonopin 0.125 mg TID times daily, Prozac 20 mg daily, add klonopin 0.25mg  HS.              -antipsychotic  agents: N/A 5. Neuropsych: This patient is capable of making decisions on his own behalf. 6. Skin/Wound Care: Routine skin checks 7. Fluids/Electrolytes/Nutrition: Routine in and outs 8.  Hypertension.  Norvasc 5 mg daily, Toprol-XL 25 mg daily.    Blood pressure is labile, but elevated, as needed hydralazine added on 10/16  Increase Norvasc to 10mg .   Increase hydralazine to 100 mg TID- monitor effect  Add back HS klonopin which will help with anxiety and BP  Lasix 10mg  today will also help with BP            Monitor with increased mobility 9.  Hyperlipidemia.  Zocor/Zetia 10.  COPD with history of tobacco use.  Continue nebulizers as directed as well as Singulair.  Check oxygen saturations every shift 11.  Diabetes mellitus with hyperglycemia.  Hemoglobin A1c 6.7.  Continue Amaryl 0.5 mg twice daily  Elevated on 10/16, will consider medication adjustments if persistent  Provide dietary education- recommended avoiding added sugar in drinks and meals             Monitor with increased mobility 12.  Right side parotid mass.  Follow-up outpatient ENT 13.  CKD stage III.  Baseline creatinine 1.1-1.3.               Cr up to 1.41- place  nursing order to encourage 6-8 glasses of water per day and creatinine has normalized to 1.10, monitor weekly.  Repeat in am  14.  GERD.  Continue Protonix 15. Right breat toe bunion: requested ortho tech eval for orthotic. Discussed benefits of icing.  15. Bradycardia: decrease lopressor to 12.5mg  Vitals:   08/18/21 2014 08/19/21 0341  BP: (!) 166/69 (!) 122/58  Pulse: (!) 58 66  Resp: 18 18  Temp: 98.1 F (36.7 C) 97.9 F (36.6 C)  SpO2: 99% 98%   16. Right CMC arthritis: discussed bracing and he defers at this time. Added voltaren gel QID 17. Disturbed sleep  10/29- will try Traozdone for sleep- to help him sleep through- 50 mg QHS for now 18. Left foot swelling: placed nursing order to request elevated and ice 15 minutes three times per day, avoid pressure garments as he feels this worsened swelling. Vascular Ultrasound reviewed with patient and is negative. Lasix 10mg  today    LOS: 19 days A FACE TO FACE EVALUATION WAS PERFORMED  Macaela Presas P Vernadine Coombs 08/19/2021, 11:16 AM

## 2021-08-19 NOTE — Progress Notes (Addendum)
Physical Therapy Session Note  Patient Details  Name: Barry [name] MRN: [id number] Date of Birth: [birth year]  Today's Date: 08/19/2021 PT Individual Time: 1535-1650 PT Individual Time Calculation (min): 75 min   Short Term Goals: Week 3:  PT Short Term Goal 1 (Week 3): STG = LTG due to ELOS  Skilled Therapeutic Interventions/Progress Updates:   Pt received sitting in w/c and agreeable to therapy session. Transported to/from gym in w/c for time management and energy conservation. Pt reports he feels that his LEs are weaker than when he first arrived to CIR with poor awareness/recall of his progress and his initial functional mobility level. Pt requesting to participate in gait training using Ethelene Hal stating he feels stronger when using that compared to RW.  Sit<>stands w/c<>RW or [place] walker with min assist throughout session - cuing for powering up through B LEs as opposed to compensating by pushing up with B UEs.  Gait training 52ft + 140ft using Ethelene Hal including ~4x 90 degree turns with +2 w/c follow for safety - requires consistent mod assist for balance during 1st gait trial due to heavy L lean caused by poor R weight shift and excessive L LE adduction during swing causing scissoring in addition to "soft knees" resulting in crouched gait posturing - cuing for improvement with pt able to recall cues from prior therapy sessions but not able to initiate them without verbal cuing  - requires min progressed to mod assist during 2nd walk as just described.  Stair navigation training 4steps + 8 steps using B HRs with +2 for safety - cuing for reciprocal pattern on ascent for L LE NMR targeting hip/knee extensor activation and then for step-to pattern leading with L LE on descent for safety - requires mod assist for lifting up during ascent then increased mod assist for balance on descent due to posterior lean as well as thearpist blocking L knee on descent due to instability.  Gait  training 140ft + 68ft + 66ft using RW with min progressed to mod assist during each gait trial due to fatigue with +2 providing close w/c follow for safety as pt continues to demo "soft knees" maintained in crouched posture throughout (worsening with fatigue) as well as L lean due to lack of R weight shift onto R stance limb combined with excessive L LE adduction during swing causing scissoring - repeated multimodal cuing throughout for improvement.  Performed L LE NMR via repeated step-ups on/off 1st 6" step using B HRs then tapping R LE onto 2nd step to promote increased L hip hip/knee extensor activation - requires mod assist for safety when lifting up onto step - 3 sets to fatigue averaging ~8reps each. Swapped and performed leading with R LE stepping up then placing L LE onto 2nd step to target L swing phase advancement - pt able to place L foot on/off 2nd step without assist - min assist for balance and lifting/lowering - 1 set x10reps to fatigue.  Addendum: Did trial Swedish knee cage on L LE when stepping up leading with L onto 1st step to act as tactile feedback for pt to push his knee back into in order to increase quad activation (hypothesis was that pt was avoiding terminal knee extension because when his knee "snaps" out of full extension, he perceives that as "buckling" resulting in pt avoiding increased quad activation? therefore used knee cage to prevent knee locking into extension while still allowing increased quad activation - would need to adjust brace and  trial more to determine if this is effective)  Transported back to room. Pt left seated in w/c with needs in reach and seat belt alarm on.   Therapy Documentation Precautions:  Precautions Precautions: Fall Precaution Comments: L hemi, L inattention, HOH Restrictions Weight Bearing Restrictions: No   Pain:  Denies L foot pain during session although does report it has been swollen.   Therapy/Group: Individual  Therapy  Tawana Scale , PT, DPT, NCS, CSRS  08/19/2021, 3:14 PM

## 2021-08-19 NOTE — Progress Notes (Signed)
Physical Therapy Session Note  Patient Details  Name: Barry [name] MRN: [id number] Date of Birth: [birth year]  Today's Date: 08/19/2021 PT Individual Time: 1132-1158 PT Individual Time Calculation (min): 26 min   Short Term Goals: Week 3:  PT Short Term Goal 1 (Week 3): STG = LTG due to ELOS  Skilled Therapeutic Interventions/Progress Updates:     Pt seated in w/c to start session. Agreeable to PT tx. No reports of pain. Reports frustration with inconsistencies with RLE strength - noted difficulty moving his foot last night while he was trying to sleep. Provided education regarding stroke recovery and potential of inconsistencies - spoke about time of day, focused attention impacting this as well. Transported pt to main rehab hallway for time. Focused remainder of session on functional gait training. He ambulated ~17ft + ~36ft + ~77ft with minA (fading to modA after ~23ft) and RW with +2 assist for w/c follow for safety. Gait is primarily impacted by adduction of paretic LLE, inconsistent step lengths with LLE, L lateral trunk lean, and poor postural control/awareness. Max verbal cues throughout for gait deficits. Pt returned to his room at completion of session, remanded seated in w/c with chair alarm on and all needs within reach.   Therapy Documentation Precautions:  Precautions Precautions: Fall Precaution Comments: L hemi, L inattention, HOH Restrictions Weight Bearing Restrictions: No General:    Therapy/Group: Individual Therapy  [name] 08/19/2021, 7:26 AM

## 2021-08-19 NOTE — Progress Notes (Signed)
Patient c/o pain to L foot and not able to move toes on L foot, not patient previous baseline. Continues swollen with +1 pitting edema. L dorsalis pedis pulse present, L foot currently elevated of pillow. PRN tylenol given for pain. Patient resting in bed with call bell within reach. Will continue with plan of care.

## 2021-08-19 NOTE — Progress Notes (Signed)
Speech Language Pathology Daily Session Note  Patient Details  Name: KERN GINGRAS MRN: [id number] Date of Birth: 06/25/1933  Today's Date: 08/19/2021 SLP Individual Time: 0930-1000 SLP Individual Time Calculation (min): 30 min  Short Term Goals: Week 3: SLP Short Term Goal 1 (Week 3): Patient will demonstrate awareness to errors and difficulties during ADL tasks with minA verbal cues. SLP Short Term Goal 2 (Week 3): Patient will be able to demonstrate recall and understanding after reading and discussion of stroke and other health related topics, with minA verbal cues. SLP Short Term Goal 3 (Week 3): After initial instructions and demonstration, patient will be able to perform novel cognitive tasks/games/exercises with minA verbal and visual cues for accuracy. SLP Short Term Goal 4 (Week 3): Patient will demonstrate adequate anticipatory awareness by describing at least 4 ADL's that he will need assistance with as well as what type of assistance he will require, with modA verbal cues.  Skilled Therapeutic Interventions: Pt received upright in wheelchair and agreeable to skilled ST intervention with focus on cognitive goals. SLP facilitated session by providing mod I-to-sup a verbal cues for recall of stress relief/mindfulness and breathing exercises discussed during previous session. SLP reinforced education through verbal/written explanation and demonstration. Pt returned demonstration with mod I for referring to handout which was provided to patient per request. Patient was left in wheelchair with alarm activated and immediate needs within reach at end of session. Continue per current plan of care.      Pain Pain Assessment Pain Scale: 0-10 Pain Score: 0-No pain  Therapy/Group: Individual Therapy  Delorese Sellin T Brinlyn Cena 08/19/2021, 10:05 AM

## 2021-08-19 NOTE — Plan of Care (Signed)
  Problem: RH Problem Solving Goal: LTG Patient will demonstrate problem solving for (SLP) Description: LTG:  Patient will demonstrate problem solving for basic/complex daily situations with cues  (SLP) 08/19/2021 1138 by Lillie Columbia R, CCC-SLP Note: Downgraded due to slow progress 08/19/2021 1137 by [name], CCC-SLP Flowsheets (Taken 08/19/2021 1137) LTG Patient will demonstrate problem solving for: Minimal Assistance - Patient > 75%   Problem: RH Memory Goal: LTG Patient will demonstrate ability for day to day (SLP) Description: LTG:   Patient will demonstrate ability for day to day recall/carryover during cognitive/linguistic activities with assist  (SLP) 08/19/2021 1138 by Lillie Columbia R, CCC-SLP Note: Downgraded due to slow progress 08/19/2021 1137 by [name], CCC-SLP Flowsheets (Taken 08/19/2021 1137) LTG: Patient will demonstrate ability for day to day recall/carryover during cognitive/linguistic activities with assist (SLP): Minimal Assistance - Patient > 75% Goal: LTG Patient will use memory compensatory aids to (SLP) Description: LTG:  Patient will use memory compensatory aids to recall biographical/new, daily complex information with cues (SLP) 08/19/2021 1138 by Lillie Columbia R, CCC-SLP Note: Downgraded due to slow progress 08/19/2021 1137 by [name], CCC-SLP Flowsheets (Taken 08/19/2021 1137) LTG: Patient will use memory compensatory aids to (SLP): Minimal Assistance - Patient > 75%   Problem: RH Awareness Goal: LTG: Patient will demonstrate awareness during functional activites type of (SLP) Description: LTG: Patient will demonstrate awareness during functional activites type of (SLP) 08/19/2021 1138 by Lillie Columbia R, CCC-SLP Note: Downgraded due to slow progress 08/19/2021 1137 by [name], CCC-SLP Flowsheets (Taken 08/19/2021 1137) Patient will demonstrate during cognitive/linguistic activities awareness type of: Emergent LTG: Patient  will demonstrate awareness during cognitive/linguistic activities with assistance of (SLP): Minimal Assistance - Patient > 75%

## 2021-08-20 LAB — GLUCOSE, CAPILLARY
Glucose-Capillary: 120 mg/dL — ABNORMAL HIGH (ref 70–99)
Glucose-Capillary: 126 mg/dL — ABNORMAL HIGH (ref 70–99)
Glucose-Capillary: 118 mg/dL — ABNORMAL HIGH (ref 70–99)
Glucose-Capillary: 181 mg/dL — ABNORMAL HIGH (ref 70–99)

## 2021-08-20 MED ORDER — PSEUDOEPHEDRINE HCL 30 MG PO TABS
30.0000 mg | ORAL_TABLET | ORAL | Status: DC | PRN
  Administered 2021-08-21 – 2021-08-24 (×3): 30 mg via ORAL
  Filled 2021-08-20 (×5): qty 1

## 2021-08-20 NOTE — Progress Notes (Signed)
[place] PHYSICAL MEDICINE & REHABILITATION PROGRESS NOTE  Subjective/Complaints: No new complaints this morning Appreciate Deborah removing and mailing Zio Patch No more nightmares Needs to use restroom  ROS:  Pt denies SOB, abd pain, CP, N/V/C/D, and vision changes, +left foot swelling and pain, +left shoulder pain, nightmares resolved   Objective: Vital Signs: Blood pressure 137/67, pulse 66, temperature 98.6 F (37 C), temperature source Oral, resp. rate 17, height 5\' 11"  (1.803 m), weight 74.2 kg, SpO2 99 %. VAS [place] LOWER EXTREMITY VENOUS (DVT)  Result Date: 08/18/2021  Lower Venous DVT Study Patient Name:  [facility]  Date of Exam:   08/18/2021 Medical Rec #: [id number]          Accession #:    [id number] Date of Birth: 1933-01-23         Patient Gender: M Patient Age:   87 years Exam Location:  [facility] Procedure:      VAS [place] LOWER EXTREMITY VENOUS (DVT) Referring Phys: Leeroy Cha --------------------------------------------------------------------------------  Indications: Left foot edema.  Comparison Study: No prior studies. Performing Technologist: Darlin Coco RDMS, RVT  Examination Guidelines: A complete evaluation includes B-mode imaging, spectral Doppler, color Doppler, and power Doppler as needed of all accessible portions of each vessel. Bilateral testing is considered an integral part of a complete examination. Limited examinations for reoccurring indications may be performed as noted. The reflux portion of the exam is performed with the patient in reverse Trendelenburg.  +-----+---------------+---------+-----------+----------+--------------+ RIGHTCompressibilityPhasicitySpontaneityPropertiesThrombus Aging +-----+---------------+---------+-----------+----------+--------------+ CFV  Full           Yes      Yes                                 +-----+---------------+---------+-----------+----------+--------------+    +---------+---------------+---------+-----------+----------+--------------+ LEFT     CompressibilityPhasicitySpontaneityPropertiesThrombus Aging +---------+---------------+---------+-----------+----------+--------------+ CFV      Full           Yes      Yes                                 +---------+---------------+---------+-----------+----------+--------------+ SFJ      Full                                                        +---------+---------------+---------+-----------+----------+--------------+ FV Prox  Full                                                        +---------+---------------+---------+-----------+----------+--------------+ FV Mid   Full                                                        +---------+---------------+---------+-----------+----------+--------------+ FV DistalFull                                                        +---------+---------------+---------+-----------+----------+--------------+  PFV      Full                                                        +---------+---------------+---------+-----------+----------+--------------+ POP      Full           Yes      Yes                                 +---------+---------------+---------+-----------+----------+--------------+ PTV      Full                                                        +---------+---------------+---------+-----------+----------+--------------+ PERO     Full                                                        +---------+---------------+---------+-----------+----------+--------------+ Gastroc  Full                                                        +---------+---------------+---------+-----------+----------+--------------+    Summary: RIGHT: - No evidence of common femoral vein obstruction.  LEFT: - There is no evidence of deep vein thrombosis in the lower extremity.  - No cystic structure found in the popliteal fossa.   *See table(s) above for measurements and observations. Electronically signed by Deitra Mayo MD on 08/18/2021 at 5:06:24 PM.    Final    No results for input(s): WBC, HGB, HCT, PLT in the last 72 hours.  No results for input(s): NA, K, CL, CO2, GLUCOSE, BUN, CREATININE, CALCIUM in the last 72 hours.    Intake/Output Summary (Last 24 hours) at 08/20/2021 1132 Last data filed at 08/20/2021 0814 Gross per 24 hour  Intake 720 ml  Output 1300 ml  Net -580 ml        Physical Exam: BP 137/67 (BP Location: Left Arm)   Pulse 66   Temp 98.6 F (37 C) (Oral)   Resp 17   Ht 5\' 11"  (1.803 m)   Wt 74.2 kg   SpO2 99%   BMI 22.81 kg/m  Gen: no distress, normal appearing HEENT: oral mucosa pink and moist, NCAT Cardio: Reg rate Chest: normal effort, normal rate of breathing Abd: soft, non-distended Ext: no edema Psych: pleasant, normal affect Skin: intact Musculoskeletal: much less TTP over right thumb CMC joint, -no swelling; no erythema Left shoulder subluxation Left foot swelling Neuro: Makes eye contact with examiner.   Follows commands.   Name and age.   He does have some mild delay in processing. Difficulty with medication management.  Dysarthria, unchanged Motor: RUE/RLE: 4/5 proximal distal LUE: 4-/5 proximal distal LLE: 3/5 proximal 2/5 distal,  Dysmetria LUE and LLE Using Stedy to get to bathroom  Assessment/Plan: 1. Functional  deficits which require 3+ hours per day of interdisciplinary therapy in a comprehensive inpatient rehab setting. Physiatrist is providing close team supervision and 24 hour management of active medical problems listed below. Physiatrist and rehab team continue to assess barriers to discharge/monitor patient progress toward functional and medical goals   Care Tool:  Bathing    Body parts bathed by patient: Right arm, Left arm, Chest, Abdomen, Front perineal area, Right upper leg, Left upper leg, Face   Body parts bathed by helper:  Buttocks, Right lower leg, Left lower leg     Bathing assist Assist Level: Moderate Assistance - Patient 50 - 74%     Upper Body Dressing/Undressing Upper body dressing   What is the patient wearing?: Pull over shirt    Upper body assist Assist Level: Minimal Assistance - Patient > 75%    Lower Body Dressing/Undressing Lower body dressing      What is the patient wearing?: Pants     Lower body assist Assist for lower body dressing: Maximal Assistance - Patient 25 - 49%     Toileting Toileting    Toileting assist Assist for toileting: Moderate Assistance - Patient 50 - 74%     Transfers Chair/bed transfer  Transfers assist     Chair/bed transfer assist level: Moderate Assistance - Patient 50 - 74%     Locomotion Ambulation   Ambulation assist   Ambulation activity did not occur: Safety/medical concerns  Assist level: 2 helpers (mod A & +2 w/c follow) Assistive device: Walker-rolling Max distance: 146ft   Walk 10 feet activity   Assist  Walk 10 feet activity did not occur: Safety/medical concerns  Assist level: 2 helpers Assistive device: Walker-Eva   Walk 50 feet activity   Assist Walk 50 feet with 2 turns activity did not occur: Safety/medical concerns  Assist level: 2 helpers Assistive device: Walker-Eva    Walk 150 feet activity   Assist Walk 150 feet activity did not occur: Safety/medical concerns  Assist level: 2 helpers Assistive device: Walker-Eva    Walk 10 feet on uneven surface  activity   Assist Walk 10 feet on uneven surfaces activity did not occur: Safety/medical concerns         Wheelchair     Assist Is the patient using a wheelchair?: Yes Type of Wheelchair: Manual    Wheelchair assist level: Supervision/Verbal cueing Max wheelchair distance: 38ft    Wheelchair 50 feet with 2 turns activity    Assist        Assist Level: Dependent - Patient 0%   Wheelchair 150 feet activity     Assist       Assist Level: Dependent - Patient 0%    Medical Problem List and Plan: 1.  Left-sided hemiparesis secondary to infarct right posterior lentiform nucleus, thalamus, and tail of the caudate with additional punctate infarct in the lateral left thalamus.   Continue CIR ELOS 25 days; encouraged regarding his excellent progress and improvements in his strength! 2.  Impaired mobility: -DVT/anticoagulation:  Pharmaceutical: Change heparin to Lovenox             -antiplatelet therapy: Aspirin 81 mg daily and Plavix 75 mg daily until 10/30, then Plavix alone. 3. Low back pain: Improved. Add kpad. Discontinue Lyrica, discontinue hydrocodone             Monitor with increased exertion 4. Anxiety: Decrease Klonopin 0.125 mg TID times daily, Prozac 20 mg daily, continue klonopin 0.25mg  HS.              -  antipsychotic agents: N/A 5. Neuropsych: This patient is capable of making decisions on his own behalf. 6. Skin/Wound Care: Routine skin checks 7. Fluids/Electrolytes/Nutrition: Routine in and outs 8.  Hypertension.   Continue Norvasc to 10mg .   Increase hydralazine to 100 mg TID- monitor effect  Add back HS klonopin which will help with anxiety and BP            Monitor with increased mobility 9.  Hyperlipidemia.  Zocor/Zetia 10.  COPD with history of tobacco use.  Continue nebulizers as directed as well as Singulair.  Check oxygen saturations every shift 11.  Diabetes mellitus with hyperglycemia.  Hemoglobin A1c 6.7.  Continue Amaryl 0.5 mg twice daily  Elevated on 10/16, will consider medication adjustments if persistent  Provide dietary education- recommended avoiding added sugar in drinks and meals             Monitor with increased mobility 12.  Right side parotid mass.  Follow-up outpatient ENT 13.  CKD stage III.  Baseline creatinine 1.1-1.3.               Cr up to 1.41- place nursing order to encourage 6-8 glasses of water per day and creatinine has normalized to 1.10, monitor weekly.   Repeat in am  14.  GERD.  Continue Protonix 15. Right breat toe bunion: requested ortho tech eval for orthotic. Discussed benefits of icing.  15. Bradycardia: decrease lopressor to 12.5mg  Vitals:   08/20/21 0345 08/20/21 0513  BP: (!) 164/71 137/67  Pulse: 71 66  Resp: 16 17  Temp: 98.6 F (37 C)   SpO2: 97% 99%   16. Right CMC arthritis: discussed bracing and he defers at this time. Added voltaren gel QID 17. Disturbed sleep  10/29- will try Traozdone for sleep- to help him sleep through- 50 mg QHS for now 18. Left foot swelling: placed nursing order to request elevated and ice 15 minutes three times per day, avoid pressure garments as he feels this worsened swelling. Vascular Ultrasound reviewed with patient and is negative. Lasix 10mg  on 11/2    LOS: 20 days A FACE TO FACE EVALUATION WAS PERFORMED  Clide Deutscher Phi Avans 08/20/2021, 11:32 AM

## 2021-08-20 NOTE — Progress Notes (Signed)
Patient ID: Barry [name], male   DOB: 07-Apr-1933, 87 y.o.   MRN: [id number] Zio patch removed and mailed to company per protocol. Hervey Ard, Dalbert Batman

## 2021-08-20 NOTE — Progress Notes (Signed)
Physical Therapy Session Note  Patient Details  Name: Barry [name] MRN: [id number] Date of Birth: November 11, 1932  Today's Date: 08/20/2021 PT Individual Time: 1000-1055 PT Individual Time Calculation (min): 55 min   Short Term Goals: Week 3:  PT Short Term Goal 1 (Week 3): STG = LTG due to ELOS  Skilled Therapeutic Interventions/Progress Updates:     Pt received in w/c at start of session - agreeable to PT treatment. No reports of pain. Requests assistance in changing from dirty t-shir to a clean button-up t-shirt. He's able to doff shirt without assist and donned button-up with minA and then required assistance for buttoning (no button-aid in room). Pt then transported in w/c to day room rehab gym for time and energy conservation. Completed functional gait training where he ambulated ~142ft + ~2ft with +2 modA and RW - continues to demonstrate adduction of LLE resulting in scissoring pattern, strong L truncal lean, inconsistent step lengths, and difficulty achieve adequate hip/knee extension for full upright as he ambulates with "soft knees" in "cowboy stance." Next, assisted pt onto nustep where he completed 10 minutes at workload of 5 - required frequent cues for monitoring L knee alignment due to fall out and also monitoring R foot from sliding from Nustep paddle. He required modA for squat<>pivot transfers from w/c to nustep in both L and R directions. Pt then instructed on 2x5 sit<>stands while keeping a soft ball b/w legs to encourage knee alignment and facilitation posterior chain - required modA for sit<>stands and continues to struggle with full upright posture. Pt transported to room and remained seated in w/c with safety belt alarm on, all his needs within reach.   Therapy Documentation Precautions:  Precautions Precautions: Fall Precaution Comments: L hemi, L inattention, HOH Restrictions Weight Bearing Restrictions: No General:    Therapy/Group: Individual  Therapy  [name] 08/20/2021, 7:35 AM

## 2021-08-20 NOTE — Progress Notes (Addendum)
Occupational Therapy Session Note  Patient Details  Name: Barry [name] MRN: [id number] Date of Birth: 30-Apr-1933  Today's Date: 08/20/2021 OT Individual Time:1115-1145; 6063-0160 OT Individual Time Calculation (min): 30 min; and 55 min    Short Term Goals: Week 3:  OT Short Term Goal 1 (Week 3): STGs=LTGs due to ELOS    Skilled Therapeutic Interventions/Progress Updates:    First session: Pt sitting in w/c requesting to use toilet.  Pt transported to bathroom via w/c and completed stand pivot using grab bars with min assist.  Pt completed clothing management with min assist to maintain static standing balance at RW and multimodal cues to encourage righting to midline.  Pt had continent episode of bowel (see flowsheet) and completed pericare in standing with min assist fading to mod assist due to fatigue and inattention and resulting in left lean.  Pt completed stand pivot to w/c again with min assist and step by step cueing.  Pt returned to room while self propelling w/c with min assist for small space negotiation.  Call bell in reach, seat alarm on.  Second session: Pt sitting up in w/c, reports he feels like he has a head cold.  Nurse made aware. Noted pt with slight increase in swelling left anterior foot. Still agreeable to participating in OT session.  Pt transported to ortho gym and completed dynamic standing balance task to encourage righting skills and standing endurance with dual cognitive and functional reach component.  Pt required question cues each time prior to sit to stand to ensure locking of w/c brakes and safe hand/foot placement.  Pt stood with min assist for first two trials of 2 minute duration to recall 2-5 word sequences.  Pt needing increased assist of mod with heavier left and posterior lean during third standing trial.  Pt transported to kitchen.  Stand pivot w/c to counter using RW with min assist, visual priming demonstration and step by step multimodal cues. Pt  participated in reach and place task at counter using upper cabinets with facilitation of right weight shift and leaning.  Again pt noting to need greater assist after standing for approximately 2-3 minutes going from min assist to requiring mod assist with heavier left lean despite cues provided to correct.  Pt transported back to room, call bell in reach, LLE elevated, seat alarm on.    Therapy Documentation Precautions:  Precautions Precautions: Fall Precaution Comments: L hemi, L inattention, HOH Restrictions Weight Bearing Restrictions: No   Therapy/Group: Individual Therapy  [name] 08/20/2021, 2:41 PM

## 2021-08-20 NOTE — Evaluation (Signed)
Recreational Therapy Assessment and Plan  Patient Details  Name: TYRESE CAPRIOTTI MRN: [id number] Date of Birth: 1933/01/03 Today's Date: 08/20/2021  Rehab Potential:  Good ELOS:   d/c 11/8  Assessment  Hospital Problem: Principal Problem:   Right thalamic infarction [facility])     Past Medical History:      Past Medical History:  Diagnosis Date   AAA (abdominal aortic aneurysm) 3-09    4.7 cm   Allergy      rhinitis   Anxiety     Cancer ([place])      skin   Cataract     Depression     Diabetes mellitus     Diverticulosis     GERD (gastroesophageal reflux disease)     Hepatitis     Hx of colonic polyps 08/26/2015   Hyperlipidemia     Hypertension     Nephrolithiasis     Personal history of colonic polyps      adenomas, serrated also   PVD (peripheral vascular disease) ([place])     Tobacco abuse      Past Surgical History:       Past Surgical History:  Procedure Laterality Date   COLONOSCOPY   2006, 2008, 2010, 05/27/2011    numerous adenomas - 13 in 2006, 3, 2008, 2 2012 (up to 1 cm), 4 diminutive adenomas, serrated adenomas 2012. Diverticulosis and hemorrhoids also.   EYE SURGERY       EYE SURGERY Left     Kidney stone operation       lower limb amputation, other toe 5th       percutaneous stent graft repair of infrarenal AAA   11/10    5.6 cm (T. early)   surgical excision basal cell carcinoma       TEE WITHOUT CARDIOVERSION N/A 07/29/2021    Procedure: TRANSESOPHAGEAL ECHOCARDIOGRAM (TEE);  Surgeon: Geralynn Rile, MD;  Location: [facility];  Service: Cardiovascular;  Laterality: N/A;   tympanic eardrum repair       VASECTOMY          Assessment & Plan Clinical Impression: Patient is a 87 y.o. year old male with with history of CAD maintained on low-dose aspirin, CKD stage III, COPD/tobacco use, type 2 diabetes mellitus, hypertension, GERD.  History taken from patient and chart review.  Patient lives alone.  Mobile home with 3 steps to entry.  Reportedly  independent prior to admission using an occasional cane.  He does have a son in the [place] area who works and can provide assistance in the evening.  He presented on 07/27/2021 to [facility] with acute onset of left-sided hemiparesis.  CT/MRI showed acute right posterior lentiform nucleus, thalamus, anterior caudate infarct with additional punctate infarct in the lateral left thalamus and possibly the left lentiform nucleus.  CT angiogram head and neck with mild atherosclerotic plaque of the bilateral carotid bulbs resulting in 30 to 40% stenosis on the right.  1 cm enhancing lesion in the right parotid gland indeterminate recommend ENT follow-up as outpatient.  MRA head showed no significant proximal stenosis aneurysm or branch vessel occlusion.  Echocardiogram with ejection fraction of 60 to 89%, grade 1 diastolic dysfunction.  TEE completed showing no left atrial/left atrial appendage or thrombus detected.  No PFO by bubble.  He currently mains on aspirin and Plavix for CVA prophylaxis.  Subcutaneous heparin for DVT prophylaxis.  Tolerating regular consistency diet.  Therapy evaluations completed due to patient's left hemiparesis was admitted for a  comprehensive rehab program.  Please see preadmission assessment from earlier today as well.  Patient transferred to CIR on 07/31/2021 .     Pt presents with decreased activity tolerance, decreased functional mobility, decreased balance, feelings of stress due to uncertainty of recovery Limiting pt's independence with leisure/community pursuits.  Met with pt today to discuss leisure education, activity analysis, activity modifications, stress management/coping strategies.  Pt reports being active PTA and wishes to return to previously enjoyed activities including lawn care, driving and spending time with his lady friend.    No further TR.  Will continue to monitor for education needs.    Recommendations for other services: None   Discharge Criteria:  Patient will be discharged from TR if patient refuses treatment 3 consecutive times without medical reason.  If treatment goals not met, if there is a change in medical status, if patient makes no progress towards goals or if patient is discharged from hospital.  The above assessment, treatment plan, treatment alternatives and goals were discussed and mutually agreed upon: by patient  [place] 08/20/2021, 4:25 PM

## 2021-08-20 NOTE — Progress Notes (Signed)
Speech Language Pathology Daily Session Note  Patient Details  Name: Barry [name] MRN: [id number] Date of Birth: 1933/04/24  Today's Date: 08/20/2021 SLP Individual Time: 1445-1530 SLP Individual Time Calculation (min): 45 min  Short Term Goals: Week 3: SLP Short Term Goal 1 (Week 3): Patient will demonstrate awareness to errors and difficulties during ADL tasks with minA verbal cues. SLP Short Term Goal 2 (Week 3): Patient will be able to demonstrate recall and understanding after reading and discussion of stroke and other health related topics, with minA verbal cues. SLP Short Term Goal 3 (Week 3): After initial instructions and demonstration, patient will be able to perform novel cognitive tasks/games/exercises with minA verbal and visual cues for accuracy. SLP Short Term Goal 4 (Week 3): Patient will demonstrate adequate anticipatory awareness by describing at least 4 ADL's that he will need assistance with as well as what type of assistance he will require, with modA verbal cues.  Skilled Therapeutic Interventions:   Patient seen for skilled ST session focusing on cognitive function goals. When SLP arrived in room, patient sitting in [facility] and apologizing for a "head cold" he has had since previous evening. He demonstrate recall and improved overall awareness to deficits as well as perceived benefit from additional therapy at SNF (recent change in discharge plan). He reported that he does feel that he has improved but would like to get to where he can move around better before he went to stay with son. He continues to have ultimate goal of being able to return home, by himself, and being able to drive short distances and use his riding lawnmower. Although he has demonstrated improved awareness, he continues to require cues to demonstrate understanding of reasonable expectations of recovery and ability to focus on more realistic goals. (Ex: setting goal of being able to safely move about in his  home prior to setting goal for outside work, etc. Patient overall is a little more receptive towards advice and new strategies that are different from his normal routine. He was left in [facility] with all needs in place. He continues to benefit from skilled SLP intervention to maximize cognitive function prior to discharge.  Pain Pain Assessment Pain Scale: 0-10 Pain Score: 0-No pain  Therapy/Group: Individual Therapy  Sonia Baller, MA, CCC-SLP Speech Therapy

## 2021-08-21 LAB — GLUCOSE, CAPILLARY
Glucose-Capillary: 110 mg/dL — ABNORMAL HIGH (ref 70–99)
Glucose-Capillary: 115 mg/dL — ABNORMAL HIGH (ref 70–99)
Glucose-Capillary: 216 mg/dL — ABNORMAL HIGH (ref 70–99)
Glucose-Capillary: 113 mg/dL — ABNORMAL HIGH (ref 70–99)

## 2021-08-21 MED ORDER — ARTIFICIAL TEARS OPHTHALMIC OINT
TOPICAL_OINTMENT | OPHTHALMIC | Status: DC | PRN
  Filled 2021-08-21: qty 3.5

## 2021-08-21 MED ORDER — CLONAZEPAM 0.125 MG PO TBDP
0.1250 mg | ORAL_TABLET | Freq: Two times a day (BID) | ORAL | Status: DC
  Administered 2021-08-21 – 2021-08-24 (×6): 0.125 mg via ORAL
  Filled 2021-08-21 (×6): qty 1

## 2021-08-21 NOTE — Progress Notes (Signed)
Patient ID: Barry [name], male   DOB: 10/14/1933, 88 y.o.   MRN: [id number]  Met with pt and son who was here for education and to see his Dad;s levels. Both have decided best option is for short term NHP. Have given both the list of facilities and son will look at this weekend. Pt disappointed not able to be more independent before leaving here. Will begin NH search. 

## 2021-08-21 NOTE — Progress Notes (Signed)
Occupational Therapy Session Note  Patient Details  Name: Barry [name] MRN: [id number] Date of Birth: 1933/10/16  Today's Date: 08/21/2021 OT Individual Time: 4196-2229 OT Individual Time Calculation (min): 28 min    Short Term Goals: Week 3:  OT Short Term Goal 1 (Week 3): STGs=LTGs due to ELOS  Skilled Therapeutic Interventions/Progress Updates:    Pt in wheelchair to start session.  He was able to complete transfer from the wheelchair to the therapy mat with mod assist stand pivot.  Mod instructional cueing for hand placement and foot placement throughout session with sit to stand and transfers.  Had him work on LUE coordination in sitting with use of the resistive clothespins.  Increased ataxia noted with functional reach with occasional dropping of the a clothespin.  Transitioned to standing to remove them after placement with transitions standing to squat to put them a container.  He needed min to mod facilitation for balance when reaching.  Increased left lean with increased left knee flexion noted.  Mod facilitation to achieve full knee extension on the left and maintain.  Rest breaks needed after removal of 6-7 clothespins.  Finished session with transfer back to the wheelchair at mod assist level secondary to LOB to the left and posteriorly into the wheelchair.  He was returned to the room with the call button and phone in reach and safety alarm belt in place.    Therapy Documentation Precautions:  Precautions Precautions: Fall Precaution Comments: L hemi, L inattention, HOH Restrictions Weight Bearing Restrictions: No  Pain: Pain Assessment Pain Scale: Faces Pain Score: 0-No pain Pain Type: Acute pain Pain Location: Head Pain Descriptors / Indicators: Headache Pain Frequency: Intermittent Pain Onset: Gradual Patients Stated Pain Goal: 0 Pain Intervention(s): Medication (See eMAR) Multiple Pain Sites: No ADL: See Care Tool Section for some details of mobility and  selfcare   Therapy/Group: Individual Therapy  Earley Grobe OTR/L 08/21/2021, 12:07 PM

## 2021-08-21 NOTE — Progress Notes (Signed)
[facility] PHYSICAL MEDICINE & REHABILITATION PROGRESS NOTE  Subjective/Complaints: No new complaints this morning Foot pain and swelling improved Congestion improved Ambulating with Christian and AJ with RW  ROS:  Pt denies SOB, abd pain, CP, N/V/C/D, and vision changes, +left foot swelling and pain, +left shoulder pain, nightmares resolved   Objective: Vital Signs: Blood pressure (!) 134/52, pulse 70, temperature 98 F (36.7 C), temperature source Oral, resp. rate 16, height 5\' 11"  (1.803 m), weight 74.2 kg, SpO2 99 %. No results found. No results for input(s): WBC, HGB, HCT, PLT in the last 72 hours.  No results for input(s): NA, K, CL, CO2, GLUCOSE, BUN, CREATININE, CALCIUM in the last 72 hours.    Intake/Output Summary (Last 24 hours) at 08/21/2021 0913 Last data filed at 08/21/2021 0731 Gross per 24 hour  Intake 600 ml  Output 2050 ml  Net -1450 ml        Physical Exam: BP (!) 134/52 (BP Location: Right Arm)   Pulse 70   Temp 98 F (36.7 C) (Oral)   Resp 16   Ht 5\' 11"  (1.803 m)   Wt 74.2 kg   SpO2 99%   BMI 22.81 kg/m  Gen: no distress, normal appearing HEENT: oral mucosa pink and moist, NCAT Cardio: Reg rate Chest: normal effort, normal rate of breathing Abd: soft, non-distended Ext: no edema Psych: pleasant, normal affect Musculoskeletal: much less TTP over right thumb CMC joint, -no swelling; no erythema Left shoulder subluxation Left foot swelling Neuro: Makes eye contact with examiner.   Follows commands.   Name and age.   He does have some mild delay in processing. Difficulty with medication management.  Dysarthria, unchanged Motor: RUE/RLE: 4/5 proximal distal LUE: 4-/5 proximal distal LLE: 3/5 proximal 2/5 distal,  Dysmetria LUE and LLE Using Stedy to get to bathroom  Assessment/Plan: 1. Functional deficits which require 3+ hours per day of interdisciplinary therapy in a comprehensive inpatient rehab setting. Physiatrist is providing  close team supervision and 24 hour management of active medical problems listed below. Physiatrist and rehab team continue to assess barriers to discharge/monitor patient progress toward functional and medical goals   Care Tool:  Bathing    Body parts bathed by patient: Right arm, Left arm, Chest, Abdomen, Front perineal area, Right upper leg, Left upper leg, Face   Body parts bathed by helper: Buttocks, Right lower leg, Left lower leg     Bathing assist Assist Level: Moderate Assistance - Patient 50 - 74%     Upper Body Dressing/Undressing Upper body dressing   What is the patient wearing?: Pull over shirt    Upper body assist Assist Level: Minimal Assistance - Patient > 75%    Lower Body Dressing/Undressing Lower body dressing      What is the patient wearing?: Pants     Lower body assist Assist for lower body dressing: Maximal Assistance - Patient 25 - 49%     Toileting Toileting    Toileting assist Assist for toileting: Moderate Assistance - Patient 50 - 74%     Transfers Chair/bed transfer  Transfers assist     Chair/bed transfer assist level: Moderate Assistance - Patient 50 - 74%     Locomotion Ambulation   Ambulation assist   Ambulation activity did not occur: Safety/medical concerns  Assist level: 2 helpers (mod A & +2 w/c follow) Assistive device: Walker-rolling Max distance: 176ft   Walk 10 feet activity   Assist  Walk 10 feet activity did not occur: Safety/medical  concerns  Assist level: 2 helpers Assistive device: Walker-Eva   Walk 50 feet activity   Assist Walk 50 feet with 2 turns activity did not occur: Safety/medical concerns  Assist level: 2 helpers Assistive device: Walker-Eva    Walk 150 feet activity   Assist Walk 150 feet activity did not occur: Safety/medical concerns  Assist level: 2 helpers Assistive device: Walker-Eva    Walk 10 feet on uneven surface  activity   Assist Walk 10 feet on uneven surfaces  activity did not occur: Safety/medical concerns         Wheelchair     Assist Is the patient using a wheelchair?: Yes Type of Wheelchair: Manual    Wheelchair assist level: Supervision/Verbal cueing Max wheelchair distance: 29ft    Wheelchair 50 feet with 2 turns activity    Assist        Assist Level: Dependent - Patient 0%   Wheelchair 150 feet activity     Assist      Assist Level: Dependent - Patient 0%    Medical Problem List and Plan: 1.  Left-sided hemiparesis secondary to infarct right posterior lentiform nucleus, thalamus, and tail of the caudate with additional punctate infarct in the lateral left thalamus.   Continue CIR ELOS 25 days; encouraged regarding his excellent progress and improvements in his strength! 2.  Impaired mobility: -DVT/anticoagulation:  Pharmaceutical: Change heparin to Lovenox             -antiplatelet therapy: Continue Aspirin 81 mg daily and Plavix 75 mg daily until 10/30, then Plavix alone. 3. Low back pain: Improved. Continue kpad. Discontinue Lyrica, discontinue hydrocodone             Monitor with increased exertion 4. Anxiety: Decrease daytime Klonopin 0.125 mg TID times daily, Prozac 20 mg daily, continue klonopin 0.25mg  HS.              -antipsychotic agents: N/A 5. Neuropsych: This patient is capable of making decisions on his own behalf. 6. Skin/Wound Care: Routine skin checks 7. Fluids/Electrolytes/Nutrition: Routine in and outs 8.  Hypertension.   Continue Norvasc to 10mg .   Increase hydralazine to 100 mg TID- monitor effect  Add back HS klonopin which will help with anxiety and BP            Monitor with increased mobility 9.  Hyperlipidemia.  Continue Zocor/Zetia 10.  COPD with history of tobacco use.  Continue nebulizers as directed as well as Singulair.  Check oxygen saturations every shift 11.  Diabetes mellitus with hyperglycemia.  Hemoglobin A1c 6.7.  Continue Amaryl 0.5 mg twice daily  Elevated on  10/16, will consider medication adjustments if persistent  Provide dietary education- recommended avoiding added sugar in drinks and meals             Monitor with increased mobility 12.  Right side parotid mass.  Follow-up outpatient ENT 13.  CKD stage III.  Baseline creatinine 1.1-1.3.               Cr up to 1.41- place nursing order to encourage 6-8 glasses of water per day and creatinine has normalized to 1.10, monitor weekly.  Repeat in am  14.  GERD.  Continue Protonix 15. Right breat toe bunion: requested ortho tech eval for orthotic. Discussed benefits of icing.  15. Bradycardia: decrease lopressor to 12.5mg  Vitals:   08/20/21 2017 08/21/21 0605  BP: (!) 155/66 (!) 134/52  Pulse: 64 70  Resp: 14 16  Temp: 98.6 F (  37 C) 98 F (36.7 C)  SpO2: 99% 99%   16. Right CMC arthritis: discussed bracing and he defers at this time. Added voltaren gel QID 17. Disturbed sleep  10/29- will try Traozdone for sleep- to help him sleep through- 50 mg QHS for now 18. Left foot swelling: placed nursing order to request elevated and ice 15 minutes three times per day, avoid pressure garments as he feels this worsened swelling. Vascular Ultrasound reviewed with patient and is negative. Lasix 10mg  on 11/2    LOS: 21 days A FACE TO FACE EVALUATION WAS PERFORMED  Barry [name] Jiovani Mccammon 08/21/2021, 9:13 AM

## 2021-08-21 NOTE — Progress Notes (Signed)
Occupational Therapy Session Note  Patient Details  Name: Barry [name] MRN: [id number] Date of Birth: [birth year]  Today's Date: 08/21/2021 OT Individual Time: 1300-1345 OT Individual Time Calculation (min): 45 min    Short Term Goals: Week 3:  OT Short Term Goal 1 (Week 3): STGs=LTGs due to ELOS   Skilled Therapeutic Interventions/Progress Updates:    First session: Pt sitting up in w/c reading newspaper, reporting he feels "pretty fare today".  Requesting to use the bathroom.  Therapist provided visual priming demonstration of RW management, body mechanics, and safe hand placement for sit to stand and ambulation.  Pt completed sit to stand with min assist and ambulation with min to mod assist to correct left lean and also requiring step by step Vcs for RW management.  Pt completed clothing management in standing with mod assist to maintain upright balance.  Encouraged external focus to improve self right using cue of "bring shoulder close to the wall on your right".  This did facilitate correcting weight shift however pt slowly returns to left lean thereafter.  Pt attempted continence of bowel, but unsuccessful.  Sit to stand with improved bilateral foot position and hand placement needing only min physical assist.  Pt ambulated from commode to w/c at bedside approximately 10 feet using RW with mod assist again needing same cues for left lean, RW mgt, and to reduce step length.  Stand to sit with mod assist to slow descent. Pt shifts weight posteriorly when stepping backwards causing LOB, needing max multimodal cues to correct with fair follow through.   Call bell in reach, seat belt alarm on at end of session.  Noted improved control in LLE today with increased precision in gait swing and stance placement allowing for decreased level of assist and increased independence during toilet transfers (ambulated to commode versus stand pivot).  Second session: Pt sitting up in w/c, son present for  family education.  Discussed dc planning; son and pt in agreement with SNF recommendation.  Educated pts son on procurement options for button hook aide to decrease pts pain level and increase independence during UB dressing.  Son receptive to purchasing for pt.  Practiced sit<>stand, stand pivots, and functional ambulation w/c<>commode using RW with son present to educate on pts current functional status.  Pt exhibited good hand and BLE placement prior to standing.  He did need max multimodal cues for weight shifting, RW management, and LLE positioning during stand pivot w/c<>EOB and ambulation to bathroom and back.  Pt required min assist for sit<>stands at w/c, EOB, and commode with increased time and Vcs to "check your balance" prior and after transfer.  Pt improving his ability to self correct to midline during static standing.  Pt requiring mod to max assist however during back stepping to approach sitting surface during ambulation due to heavy posterior weight shifting and forward lean while pushing RW away from self despite max multimodal cues.  Direct hand off to PT.  Therapy Documentation Precautions:  Precautions Precautions: Fall Precaution Comments: L hemi, L inattention, HOH Restrictions Weight Bearing Restrictions: No    Therapy/Group: Individual Therapy  [name] 08/21/2021, 2:15 PM

## 2021-08-21 NOTE — NC FL2 (Signed)
[place] LEVEL OF CARE SCREENING TOOL     IDENTIFICATION  Patient Name: Barry [name] Birthdate: 10/12/1933 Sex: male Admission Date (Current Location): 07/31/2021  [facility] and [place] Number:  [employer] and Address:  The [place], [place] [address] [place],  [address]      Provider Number: [id number]  Attending Physician Name and Address:  Izora Ribas, MD  Relative Name and Phone Number:  Lupita Dawn [phone]    Current Level of Care: Other (Comment) (Rehab) Recommended Level of Care: [place] Prior Approval Number:    Date Approved/Denied:   PASRR Number: [id number]  Discharge Plan: SNF    Current Diagnoses: Patient Active Problem List   Diagnosis Date Noted   Right thalamic infarction ([place]) 07/31/2021   Stage 3a chronic kidney disease ([place])    Benign essential HTN    Left hemiparesis ([place])    Type 2 diabetes mellitus with hyperglycemia, without long-term current use of insulin (HCC)    CVA (cerebral vascular accident) ([place]) 07/27/2021   Frequent PVCs 03/20/2020   Aortic stenosis 03/20/2020   COPD (chronic obstructive pulmonary disease) ([place]) 03/20/2020   Constipation 04/30/2019   Protein-calorie malnutrition ([place]) 04/30/2019   Anxiety with depression 12/05/2018   Seasonal and perennial allergic rhinitis 08/09/2018   Carotid artery disease ([place]) 11/18/2017   Hx of colonic polyps 08/26/2015   Erectile dysfunction 02/17/2015   AAA (abdominal aortic aneurysm) without rupture ([place]) 05/21/2014   Chest pain 01/31/2014   CAD (coronary artery disease) 12/24/2013   HEARING LOSS, CONDUCTIVE, LEFT 02/23/2010   DIZZINESS, CHRONIC 05/28/2008   PULMONARY NODULE 09/07/2007   Diabetes mellitus type II, controlled ([place]) 09/06/2007   Hyperlipidemia 09/06/2007   TOBACCO ABUSE 09/06/2007   Essential hypertension 09/06/2007   Allergic rhinitis 09/06/2007   GERD 09/06/2007   BASAL CELL  CARCINOMA, NOSE 09/06/2007    Orientation RESPIRATION BLADDER Height & Weight     Self, Time, Situation, Place  Normal Continent Weight: 163 lb 9.3 oz (74.2 kg) Height:  5\' 11"  (180.3 cm)  BEHAVIORAL SYMPTOMS/MOOD NEUROLOGICAL BOWEL NUTRITION STATUS      Continent Diet (Carb modified thin liquids)  AMBULATORY STATUS COMMUNICATION OF NEEDS Skin   Extensive Assist Verbally Normal                       Personal Care Assistance Level of Assistance  Bathing, Dressing Bathing Assistance: Limited assistance   Dressing Assistance: Limited assistance     Functional Limitations Info             SPECIAL CARE FACTORS FREQUENCY  Speech therapy, PT (By licensed PT), OT (By licensed OT)     PT Frequency: 5x week OT Frequency: 5x week     Speech Therapy Frequency: 3-5x week      Contractures Contractures Info: Not present    Additional Factors Info  Code Status, Allergies Code Status Info: Full Code Allergies Info: Zestril and Lisinopril           Current Medications (08/21/2021):  This is the current hospital active medication list Current Facility-Administered Medications  Medication Dose Route Frequency Provider Last Rate Last Admin   acetaminophen (TYLENOL) tablet 650 mg  650 mg Oral Q6H PRN Cathlyn Parsons, PA-C   650 mg at 08/21/21 1132   Or   acetaminophen (TYLENOL) suppository 650 mg  650 mg Rectal Q6H PRN Angiulli, Lavon Paganini, PA-C       amLODipine (  NORVASC) tablet 10 mg  10 mg Oral Daily Raulkar, Clide Deutscher, MD   10 mg at 08/21/21 3354   artificial tears (LACRILUBE) ophthalmic ointment   Both Eyes Q4H PRN Cathlyn Parsons, PA-C   Given at 08/21/21 1524   B-complex with vitamin C tablet 1 tablet  1 tablet Oral Daily Raulkar, Clide Deutscher, MD   1 tablet at 08/21/21 0751   calcium carbonate (TUMS - dosed in mg elemental calcium) chewable tablet 200-400 mg of elemental calcium  1-2 tablet Oral Q6H PRN Lovorn, Jinny Blossom, MD   400 mg of elemental calcium at 08/16/21 2238    clonazepam (KLONOPIN) disintegrating tablet 0.125 mg  0.125 mg Oral BID Raulkar, Clide Deutscher, MD       clopidogrel (PLAVIX) tablet 75 mg  75 mg Oral Daily Cathlyn Parsons, PA-C   75 mg at 08/21/21 0751   diclofenac Sodium (VOLTAREN) 1 % topical gel 2 g  2 g Topical QID Raulkar, Clide Deutscher, MD   2 g at 08/20/21 0746   enoxaparin (LOVENOX) injection 40 mg  40 mg Subcutaneous Q24H Raulkar, Clide Deutscher, MD   40 mg at 08/21/21 5625   ezetimibe (ZETIA) tablet 10 mg  10 mg Oral Daily Cathlyn Parsons, PA-C   10 mg at 08/21/21 0751   FLUoxetine (PROZAC) capsule 20 mg  20 mg Oral Daily Cathlyn Parsons, PA-C   20 mg at 08/21/21 0751   glimepiride (AMARYL) tablet 0.5 mg  0.5 mg Oral BID Cathlyn Parsons, PA-C   0.5 mg at 08/21/21 0751   hydrALAZINE (APRESOLINE) tablet 100 mg  100 mg Oral Q8H Raulkar, Clide Deutscher, MD   100 mg at 08/21/21 1523   insulin aspart (novoLOG) injection 0-9 Units  0-9 Units Subcutaneous TID WC Angiulli, Lavon Paganini, PA-C   1 Units at 08/20/21 1255   ipratropium-albuterol (DUONEB) 0.5-2.5 (3) MG/3ML nebulizer solution 3 mL  3 mL Nebulization Q6H PRN Angiulli, Lavon Paganini, PA-C       loratadine (CLARITIN) tablet 10 mg  10 mg Oral Daily Cathlyn Parsons, PA-C   10 mg at 08/21/21 6389   magnesium gluconate (MAGONATE) tablet 250 mg  250 mg Oral QHS Raulkar, Clide Deutscher, MD   250 mg at 08/20/21 2056   metoprolol succinate (TOPROL-XL) 24 hr tablet 12.5 mg  12.5 mg Oral Daily Raulkar, Clide Deutscher, MD   12.5 mg at 08/21/21 0751   montelukast (SINGULAIR) tablet 10 mg  10 mg Oral QHS AngiulliLavon Paganini, PA-C   10 mg at 08/20/21 2052   ondansetron (ZOFRAN) tablet 4 mg  4 mg Oral Q6H PRN Cathlyn Parsons, PA-C       Or   ondansetron [facility]) injection 4 mg  4 mg Intravenous Q6H PRN Angiulli, Lavon Paganini, PA-C       pantoprazole (PROTONIX) EC tablet 40 mg  40 mg Oral Daily Cathlyn Parsons, PA-C   40 mg at 08/21/21 3734   pseudoephedrine (SUDAFED) tablet 30 mg  30 mg Oral Q4H PRN Izora Ribas,  MD   30 mg at 08/21/21 1248   simvastatin (ZOCOR) tablet 40 mg  40 mg Oral QHS AngiulliLavon Paganini, PA-C   40 mg at 08/20/21 2053   traZODone (DESYREL) tablet 50 mg  50 mg Oral QHS Lovorn, Megan, MD   50 mg at 08/20/21 2053   vitamin B-12 (CYANOCOBALAMIN) tablet 1,000 mcg  1,000 mcg Oral Daily Cathlyn Parsons, PA-C   1,000 mcg at 08/21/21  0751     Discharge Medications: Please see discharge summary for a list of discharge medications.  Relevant Imaging Results:  Relevant Lab Results:   Additional Information SSN: [id number] Had all three COVID vaccines  Ayomide Zuleta, Gardiner Rhyme, LCSW

## 2021-08-21 NOTE — Plan of Care (Signed)
  Problem: Consults Goal: RH STROKE PATIENT EDUCATION Description: See Patient Education module for education specifics  Outcome: Progressing   Problem: RH BOWEL ELIMINATION Goal: RH STG MANAGE BOWEL WITH ASSISTANCE Description: STG Manage Bowel with mod I Assistance. Outcome: Progressing Goal: RH STG MANAGE BOWEL W/MEDICATION W/ASSISTANCE Description: STG Manage Bowel with Medication with  mod I Assistance. Outcome: Progressing   Problem: RH SAFETY Goal: RH STG ADHERE TO SAFETY PRECAUTIONS W/ASSISTANCE/DEVICE Description: STG Adhere to Safety Precautions With  cues Assistance/Device. Outcome: Progressing   Problem: RH PAIN MANAGEMENT Goal: RH STG PAIN MANAGED AT OR BELOW PT'S PAIN GOAL Description: At or below level 4 Outcome: Progressing   Problem: RH KNOWLEDGE DEFICIT Goal: RH STG INCREASE KNOWLEDGE OF DIABETES Description:  Patient will be able to manage DM with medications and dietary modifications using handouts and educational resources w cues Outcome: Progressing Goal: RH STG INCREASE KNOWLEDGE OF HYPERTENSION Description:  Patient will be able to manage HTN with medications and dietary modifications using handouts and educational resources w cues Outcome: Progressing Goal: RH STG INCREASE KNOWLEGDE OF HYPERLIPIDEMIA Description:  Patient will be able to manage HLD with medications and dietary modifications using handouts and educational resources w cues Outcome: Progressing Goal: RH STG INCREASE KNOWLEDGE OF STROKE PROPHYLAXIS Description:  Patient will be able to manage secondary stroke risks with medications and dietary modifications using handouts and educational resources w cues Outcome: Progressing

## 2021-08-21 NOTE — Progress Notes (Signed)
Speech Language Pathology Weekly Progress and Session Note  Patient Details  Name: Barry [name] MRN: [id number] Date of Birth: July 09, 1933  Beginning of progress report period: August 14, 2021 End of progress report period: August 21, 2021  Today's Date: 08/21/2021 SLP Individual Time: 1430-1500 SLP Individual Time Calculation (min): 30 min  Short Term Goals: Week 3: SLP Short Term Goal 1 (Week 3): Patient will demonstrate awareness to errors and difficulties during ADL tasks with minA verbal cues. SLP Short Term Goal 1 - Progress (Week 3): Met SLP Short Term Goal 2 (Week 3): Patient will be able to demonstrate recall and understanding after reading and discussion of stroke and other health related topics, with minA verbal cues. SLP Short Term Goal 2 - Progress (Week 3): Met SLP Short Term Goal 3 (Week 3): After initial instructions and demonstration, patient will be able to perform novel cognitive tasks/games/exercises with minA verbal and visual cues for accuracy. SLP Short Term Goal 3 - Progress (Week 3): Met SLP Short Term Goal 4 (Week 3): Patient will demonstrate adequate anticipatory awareness by describing at least 4 ADL's that he will need assistance with as well as what type of assistance he will require, with modA verbal cues. SLP Short Term Goal 4 - Progress (Week 3): Met  New Short Term Goals: Week 4: SLP Short Term Goal 1 (Week 4): STG=LTG due to ELOS  Weekly Progress Updates: Patient has made great progress with ST over the past week and has met 4 of 4 short term goals this reporting period. Patient is currently completing cognitive-linguistic tasks with sup-to-min A verbal and visual cues for problem solving, functional recall, and intellectual and emergent awareness. Patient/family education is ongoing. Recommend continued skilled ST intervention to maximize cognitive function and functional independence prior to discharge. Continue progression toward established LTGs  set at min assist level overall.    Intensity: Minumum of 1-2 x/day, 30 to 90 minutes Frequency: 3 to 5 out of 7 days Duration/Length of Stay: 11/8 Treatment/Interventions: Cognitive remediation/compensation;Internal/external aids;Therapeutic Activities;Cueing hierarchy;Environmental controls;Functional tasks;Patient/family education  Daily Session Skilled Therapeutic Interventions: Pt received upright in wheelchair and agreeable to skilled ST intervention with focus on pt/family education with pt's son. SLP provided both verbal and written education on cognitive-communication deficits, strategies to maximize memory, attention, safety, and functional independence. Also educated on recommendations for follow-up speech therapy and the need for supervision with medication management. Son verbalized understanding and agreement and stated he will be providing necessary support upon discharge. Patient was left in chair with alarm activated and immediate needs within reach at end of session. Continue per current plan of care.       General    Pain Pain Assessment Pain Scale: 0-10 Pain Score: 0-No pain  Therapy/Group: Individual Therapy  Patty Sermons 08/21/2021, 4:21 PM

## 2021-08-21 NOTE — Progress Notes (Signed)
Physical Therapy Session Note  Patient Details  Name: Barry [name] MRN: [id number] Date of Birth: 1932/11/01  Today's Date: 08/21/2021 PT Individual Time: 0800-0900 + 1345-1425 PT Individual Time Calculation (min): 60 min  + 40 min  Short Term Goals: Week 3:  PT Short Term Goal 1 (Week 3): STG = LTG due to ELOS  Skilled Therapeutic Interventions/Progress Updates:     1st session: Pt supine in bed to start session - agreeable to PT treatment. Denies pain but requests assistance in getting ready for the day. With HOB flat and no bed rails, he requires minA for supine<>sit, primarily for trunk management. L lean in sitting with verbal cues to correct. Donned tennis shoes with totalA for time. Assisted in squat<>pivot transfer from EOB to w/c, requiring modA for lifting and translating hips into chair. Pt requesting to complete self care tasks at the sink. While seated in w/c, he washed face, shaved, and washed his upper body with setupA. Pt requests to change out of pajama pants and into jeans. Requires modA assist for removing pants and modA for donning new jeans. Pt then transported to main rehab gym for time. Worked on functional gait training where he required +2 assist (modA for balance and +2 assist for RW management) where he ambulated ~29ft with x2 turns to the L. Continues to demonstrate L lean and scissoring/adduction of LLE. Next, worked on IT trainer - navigated up/down x4 steps with modA and 2 hand rails with +2 assist for standby. Demonstrates reciprocal stepping during ascent and step-to pattern during descent - no evidence of true knee buckling but continues to demonstrate "soft knees" in standing. Next, assisted pt on Nustep with modA squat<>pivot transfer. Completed x10 minutes at workload 6, using BUE and BLE - maintaining cadence of ~50 steps/minute. Required cues for attending to L arm during this task as well as correcting L lean in sitting. Pt assisted back to w/c via modA  squat<>pivot transfer from Nustep and then transported to room for time. Remained seated in w/c with safety belt alarm on and all needs in reach.   2nd session: Pt seated in w/c to start session with his son at the bedside for family ed/training. Pt agreeable to therapy treatment - reports mild headache that he received medication for prior from nursing. Spoke with son regarding pt's current mobility status, PT POC, PT goals, barriers to progressions, etc. Pt transported to ortho rehab gym - completed car transfer at [facility] level via stand<>pivot transfer - requires assist primarily for completing transfer and then pt able to manage LE's without assist but cues needed for LLE awareness. Pt then instructed in functional gait training where he ambulated ~134ft + ~159ft with minA (fading to modA after ~62ft) and RW with mod cues for safety awareness, L foot placement, and RW management. Difficulty with turns, especially to sitting surface .Pt then was instructed in functional stair training - he navigated up/down 4 + 4 (seated rest) steps with minA for ascent and modA for descent, while using both hand rails. Required min cues for stepping pattern and spent time educating son and guarding and foot sequencing for safety (up with R foot, down with L foot leading). Pt transported back to his room and remained seated in w/c with safety belt alarm on and all his needs within reach. Son without questions.    Therapy Documentation Precautions:  Precautions Precautions: Fall Precaution Comments: L hemi, L inattention, HOH Restrictions Weight Bearing Restrictions: No General:  Therapy/Group: Individual Therapy  Barry [name] 08/21/2021, 7:29 AM

## 2021-08-22 LAB — GLUCOSE, CAPILLARY
Glucose-Capillary: 143 mg/dL — ABNORMAL HIGH (ref 70–99)
Glucose-Capillary: 173 mg/dL — ABNORMAL HIGH (ref 70–99)
Glucose-Capillary: 174 mg/dL — ABNORMAL HIGH (ref 70–99)
Glucose-Capillary: 165 mg/dL — ABNORMAL HIGH (ref 70–99)

## 2021-08-22 NOTE — Progress Notes (Signed)
[place] PHYSICAL MEDICINE & REHABILITATION PROGRESS NOTE  Subjective/Complaints:  Still feels weak on left side no other c/os appreciate SW note   ROS:  Pt denies SOB, abd pain, CP, N/V/C/D, and vision changes, Objective: Vital Signs: Blood pressure 139/65, pulse 71, temperature 98.4 F (36.9 C), temperature source Oral, resp. rate 16, height 5\' 11"  (1.803 m), weight 74.2 kg, SpO2 99 %. No results found. No results for input(s): WBC, HGB, HCT, PLT in the last 72 hours.  No results for input(s): NA, K, CL, CO2, GLUCOSE, BUN, CREATININE, CALCIUM in the last 72 hours.    Intake/Output Summary (Last 24 hours) at 08/22/2021 0839 Last data filed at 08/22/2021 0745 Gross per 24 hour  Intake 720 ml  Output 700 ml  Net 20 ml         Physical Exam: BP 139/65 (BP Location: Left Arm)   Pulse 71   Temp 98.4 F (36.9 C) (Oral)   Resp 16   Ht 5\' 11"  (1.803 m)   Wt 74.2 kg   SpO2 99%   BMI 22.81 kg/m   General: No acute distress Mood and affect are appropriate Heart: Regular rate and rhythm no rubs murmurs or extra sounds Lungs: Clear to auscultation, breathing unlabored, no rales or wheezes Abdomen: Positive bowel sounds, soft nontender to palpation, nondistended Extremities: No clubbing, cyanosis, or edema Skin: No evidence of breakdown, no evidence of rash   Neuro: Makes eye contact with examiner.   Follows commands.   Name and age.   He does have some mild delay in processing. Difficulty with medication management.  Dysarthria, unchanged Motor: RUE/RLE: 4/5 proximal distal LUE: 4-/5 proximal distal LLE: 3/5 proximal 2/5 distal,  Dysmetria LUE and LLE Using Stedy to get to bathroom  Assessment/Plan: 1. Functional deficits which require 3+ hours per day of interdisciplinary therapy in a comprehensive inpatient rehab setting. Physiatrist is providing close team supervision and 24 hour management of active medical problems listed below. Physiatrist and rehab team  continue to assess barriers to discharge/monitor patient progress toward functional and medical goals   Care Tool:  Bathing    Body parts bathed by patient: Right arm, Left arm, Chest, Abdomen, Front perineal area, Right upper leg, Left upper leg, Face   Body parts bathed by helper: Buttocks, Right lower leg, Left lower leg     Bathing assist Assist Level: Moderate Assistance - Patient 50 - 74%     Upper Body Dressing/Undressing Upper body dressing   What is the patient wearing?: Pull over shirt    Upper body assist Assist Level: Minimal Assistance - Patient > 75%    Lower Body Dressing/Undressing Lower body dressing      What is the patient wearing?: Pants     Lower body assist Assist for lower body dressing: Maximal Assistance - Patient 25 - 49%     Toileting Toileting    Toileting assist Assist for toileting: Moderate Assistance - Patient 50 - 74%     Transfers Chair/bed transfer  Transfers assist     Chair/bed transfer assist level: Moderate Assistance - Patient 50 - 74%     Locomotion Ambulation   Ambulation assist   Ambulation activity did not occur: Safety/medical concerns  Assist level: 2 helpers (mod A & +2 w/c follow) Assistive device: Walker-rolling Max distance: 163ft   Walk 10 feet activity   Assist  Walk 10 feet activity did not occur: Safety/medical concerns  Assist level: 2 helpers Assistive device: [employer]  50 feet activity   Assist Walk 50 feet with 2 turns activity did not occur: Safety/medical concerns  Assist level: 2 helpers Assistive device: Walker-Eva    Walk 150 feet activity   Assist Walk 150 feet activity did not occur: Safety/medical concerns  Assist level: 2 helpers Assistive device: Walker-Eva    Walk 10 feet on uneven surface  activity   Assist Walk 10 feet on uneven surfaces activity did not occur: Safety/medical concerns         Wheelchair     Assist Is the patient using a  wheelchair?: Yes Type of Wheelchair: Manual    Wheelchair assist level: Supervision/Verbal cueing Max wheelchair distance: 41ft    Wheelchair 50 feet with 2 turns activity    Assist        Assist Level: Dependent - Patient 0%   Wheelchair 150 feet activity     Assist      Assist Level: Dependent - Patient 0%    Medical Problem List and Plan: 1.  Left-sided hemiparesis secondary to infarct right posterior lentiform nucleus, thalamus, and tail of the caudate with additional punctate infarct in the lateral left thalamus.   Continue CIR SNF pnd 2.  Impaired mobility: -DVT/anticoagulation:  Pharmaceutical: Change heparin to Lovenox             -antiplatelet therapy: Continue Aspirin 81 mg daily and Plavix 75 mg daily until 10/30, then Plavix alone. 3. Low back pain: Improved. Continue kpad. Discontinue Lyrica, discontinue hydrocodone             Monitor with increased exertion 4. Anxiety: Decrease daytime Klonopin 0.125 mg TID times daily, Prozac 20 mg daily, continue klonopin 0.25mg  HS.              -antipsychotic agents: N/A 5. Neuropsych: This patient is capable of making decisions on his own behalf. 6. Skin/Wound Care: Routine skin checks 7. Fluids/Electrolytes/Nutrition: Routine in and outs 8.  Hypertension.   Continue Norvasc to 10mg .   Increase hydralazine to 100 mg TID- monitor effect  Add back HS klonopin which will help with anxiety and BP       Vitals:   08/21/21 2029 08/22/21 0542  BP: (!) 160/71 139/65  Pulse: 64 71  Resp: 16 16  Temp: 98.9 F (37.2 C) 98.4 F (36.9 C)  SpO2: 99%    Labile cont to monitor with med changes 9.  Hyperlipidemia.  Continue Zocor/Zetia 10.  COPD with history of tobacco use.  Continue nebulizers as directed as well as Singulair.  Check oxygen saturations every shift 11.  Diabetes mellitus with hyperglycemia.  Hemoglobin A1c 6.7.  Continue Amaryl 0.5 mg twice daily  Elevated on 10/16, will consider medication adjustments  if persistent  Provide dietary education- recommended avoiding added sugar in drinks and meals   CBG (last 3)  Recent Labs    08/21/21 1629 08/21/21 2106 08/22/21 0714  GLUCAP 216* 113* 143*    12.  Right side parotid mass.  Follow-up outpatient ENT 13.  CKD stage III.  Baseline creatinine 1.1-1.3.               Cr up to 1.41- place nursing order to encourage 6-8 glasses of water per day and creatinine has normalized to 1.10, monitor weekly.  Repeat in am  14.  GERD.  Continue Protonix 15. Right breat toe bunion: requested ortho tech eval for orthotic. Discussed benefits of icing.  15. Bradycardia: decrease lopressor to 12.5mg - controlled HR Vitals:  08/21/21 2029 08/22/21 0542  BP: (!) 160/71 139/65  Pulse: 64 71  Resp: 16 16  Temp: 98.9 F (37.2 C) 98.4 F (36.9 C)  SpO2: 99%    16. Right CMC arthritis: discussed bracing and he defers at this time. Added voltaren gel QID 17. Disturbed sleep  10/29- will try Traozdone for sleep- to help him sleep through- 50 mg QHS for now 18. Left foot swelling: placed nursing order to request elevated and ice 15 minutes three times per day, avoid pressure garments as he feels this worsened swelling. Vascular Ultrasound reviewed with patient and is negative. Lasix 10mg  on 11/2    LOS: 22 days A FACE TO FACE EVALUATION WAS PERFORMED  Charlett Blake 08/22/2021, 8:39 AM

## 2021-08-22 NOTE — Plan of Care (Signed)
  Problem: Consults Goal: RH STROKE PATIENT EDUCATION Description: See Patient Education module for education specifics  Outcome: Progressing   Problem: RH BOWEL ELIMINATION Goal: RH STG MANAGE BOWEL WITH ASSISTANCE Description: STG Manage Bowel with mod I Assistance. Outcome: Progressing Goal: RH STG MANAGE BOWEL W/MEDICATION W/ASSISTANCE Description: STG Manage Bowel with Medication with  mod I Assistance. Outcome: Progressing   Problem: RH SAFETY Goal: RH STG ADHERE TO SAFETY PRECAUTIONS W/ASSISTANCE/DEVICE Description: STG Adhere to Safety Precautions With  cues Assistance/Device. Outcome: Progressing   Problem: RH PAIN MANAGEMENT Goal: RH STG PAIN MANAGED AT OR BELOW PT'S PAIN GOAL Description: At or below level 4 Outcome: Progressing   Problem: RH KNOWLEDGE DEFICIT Goal: RH STG INCREASE KNOWLEDGE OF DIABETES Description:  Patient will be able to manage DM with medications and dietary modifications using handouts and educational resources w cues Outcome: Progressing Goal: RH STG INCREASE KNOWLEDGE OF HYPERTENSION Description:  Patient will be able to manage HTN with medications and dietary modifications using handouts and educational resources w cues Outcome: Progressing Goal: RH STG INCREASE KNOWLEGDE OF HYPERLIPIDEMIA Description:  Patient will be able to manage HLD with medications and dietary modifications using handouts and educational resources w cues Outcome: Progressing Goal: RH STG INCREASE KNOWLEDGE OF STROKE PROPHYLAXIS Description:  Patient will be able to manage secondary stroke risks with medications and dietary modifications using handouts and educational resources w cues Outcome: Progressing

## 2021-08-23 LAB — GLUCOSE, CAPILLARY
Glucose-Capillary: 129 mg/dL — ABNORMAL HIGH (ref 70–99)
Glucose-Capillary: 142 mg/dL — ABNORMAL HIGH (ref 70–99)
Glucose-Capillary: 141 mg/dL — ABNORMAL HIGH (ref 70–99)
Glucose-Capillary: 203 mg/dL — ABNORMAL HIGH (ref 70–99)

## 2021-08-23 NOTE — Progress Notes (Signed)
Occupational Therapy Session Note  Patient Details  Name: Barry [name] MRN: [id number] Date of Birth: [birth year]  Today's Date: 08/23/2021 OT Individual Time: 1300-1355 OT Individual Time Calculation (min): 55 min    Short Term Goals: Week 3:  OT Short Term Goal 1 (Week 3): STGs=LTGs due to ELOS  Skilled Therapeutic Interventions/Progress Updates:    Patient seated in w/c, denies pain, requests lower body dressing.  Able to doff sleepwear and underwear with CGA, donns clean underwear with min A, pants min A, pull over hips CGA to maintain balance, min cues for posterior lean.  Min difficulty crossing left leg over right for increased reach for lower body dressing.  Min A for sit to stand.    To therapy gym via w/c,   completed bilateral hand dexterity/design copy task x2 with min cues in stance with min A to maintain left leg position/weight bearing.  Completed seated pelvic mobility, trunk control activities with min facilitation.  Stand pivot transfer with RW to nustep min A.  Nustep on level 5 for 6 minutes.  Completed standing stepping activity with focus on left knee control and weight shift with good tolerance.  He is able to propel w/c approx 50 feet back to room.  He remained in the w/c at close of session, seat belt alarm set and call bell in hand.    Therapy Documentation Precautions:  Precautions Precautions: Fall Precaution Comments: L hemi, L inattention, HOH Restrictions Weight Bearing Restrictions: No Other Treatments:     Therapy/Group: Individual Therapy  Carlos Levering 08/23/2021, 7:36 AM

## 2021-08-23 NOTE — Plan of Care (Signed)
  Problem: Consults Goal: RH STROKE PATIENT EDUCATION Description: See Patient Education module for education specifics  Outcome: Progressing   Problem: RH BOWEL ELIMINATION Goal: RH STG MANAGE BOWEL WITH ASSISTANCE Description: STG Manage Bowel with mod I Assistance. Outcome: Progressing Goal: RH STG MANAGE BOWEL W/MEDICATION W/ASSISTANCE Description: STG Manage Bowel with Medication with  mod I Assistance. Outcome: Progressing   Problem: RH SAFETY Goal: RH STG ADHERE TO SAFETY PRECAUTIONS W/ASSISTANCE/DEVICE Description: STG Adhere to Safety Precautions With  cues Assistance/Device. Outcome: Progressing   Problem: RH PAIN MANAGEMENT Goal: RH STG PAIN MANAGED AT OR BELOW PT'S PAIN GOAL Description: At or below level 4 Outcome: Progressing   Problem: RH KNOWLEDGE DEFICIT Goal: RH STG INCREASE KNOWLEDGE OF DIABETES Description:  Patient will be able to manage DM with medications and dietary modifications using handouts and educational resources w cues Outcome: Progressing Goal: RH STG INCREASE KNOWLEDGE OF HYPERTENSION Description:  Patient will be able to manage HTN with medications and dietary modifications using handouts and educational resources w cues Outcome: Progressing Goal: RH STG INCREASE KNOWLEGDE OF HYPERLIPIDEMIA Description:  Patient will be able to manage HLD with medications and dietary modifications using handouts and educational resources w cues Outcome: Progressing Goal: RH STG INCREASE KNOWLEDGE OF STROKE PROPHYLAXIS Description:  Patient will be able to manage secondary stroke risks with medications and dietary modifications using handouts and educational resources w cues Outcome: Progressing

## 2021-08-23 NOTE — Progress Notes (Signed)
Physical Therapy Session Note  Patient Details  Name: Barry [name] MRN: [id number] Date of Birth: 09/12/1933  Today's Date: 08/23/2021 PT Individual Time: 8937-3428 PT Individual Time Calculation (min): 39 min   Short Term Goals: Week 3:  PT Short Term Goal 1 (Week 3): STG = LTG due to ELOS  Skilled Therapeutic Interventions/Progress Updates:     Pt seated in w/c to start session. RN at bedside providing medications. Pt agreeable to PT tx. No reports of pain. Transported in w/c to main rehab gym. Worked on seated there-ex per his request. Completed LAQ and knee extension isometrics (5 second holds) with 3# ankle weight - 3x10 reps each. Motor apraxia noted and decreased awareness of LLE positioning during there-ex. Next, completed ball bounce backs with unweighted ball with 4# dowel bar - working on coordination, core strengthening, and BUE strengthening. Pt then instructed in functional gait training - ambulated ~152ft + ~135ft with modA and RW with +2 assist for w/c follow - he demonstrated more of a scissoring gait pattern with adduction of LLE > RLE with inconsistent step lengths - difficulty correcting despite max cues.  Seated rest breaks provided throughout session for recovery. Pt returned to his room at completion of session where he remained seated in w/c with safety belt alarm on, all needs in reach.   Therapy Documentation Precautions:  Precautions Precautions: Fall Precaution Comments: L hemi, L inattention, HOH Restrictions Weight Bearing Restrictions: No General:     Therapy/Group: Individual Therapy  Raylea Adcox P Geena Weinhold 08/23/2021, 7:28 AM

## 2021-08-24 LAB — GLUCOSE, CAPILLARY
Glucose-Capillary: 114 mg/dL — ABNORMAL HIGH (ref 70–99)
Glucose-Capillary: 137 mg/dL — ABNORMAL HIGH (ref 70–99)
Glucose-Capillary: 94 mg/dL (ref 70–99)
Glucose-Capillary: 178 mg/dL — ABNORMAL HIGH (ref 70–99)

## 2021-08-24 MED ORDER — ARTIFICIAL TEARS OPHTHALMIC OINT: TOPICAL_OINTMENT | OPHTHALMIC | Status: AC | PRN

## 2021-08-24 MED ORDER — AMLODIPINE BESYLATE 10 MG PO TABS: 10.0000 mg | ORAL_TABLET | Freq: Every day | ORAL | Status: DC

## 2021-08-24 MED ORDER — MAGNESIUM GLUCONATE 500 MG PO TABS: 250.0000 mg | ORAL_TABLET | Freq: Every day | ORAL | Status: DC

## 2021-08-24 MED ORDER — CLONAZEPAM 0.125 MG PO TBDP: 0.1250 mg | ORAL_TABLET | Freq: Every day | ORAL | 0 refills | Status: DC

## 2021-08-24 MED ORDER — HYDRALAZINE HCL 100 MG PO TABS: 100.0000 mg | ORAL_TABLET | Freq: Three times a day (TID) | ORAL | Status: DC

## 2021-08-24 MED ORDER — METOPROLOL SUCCINATE ER 25 MG PO TB24: 12.5000 mg | ORAL_TABLET | Freq: Every day | ORAL | Status: DC

## 2021-08-24 MED ORDER — DICLOFENAC SODIUM 1 % EX GEL: 2.0000 g | Freq: Four times a day (QID) | CUTANEOUS | Status: DC

## 2021-08-24 MED ORDER — B COMPLEX-C PO TABS: 1.0000 | ORAL_TABLET | Freq: Every day | ORAL | Status: DC

## 2021-08-24 MED ORDER — GLIMEPIRIDE 1 MG PO TABS: 0.5000 mg | ORAL_TABLET | Freq: Two times a day (BID) | ORAL | Status: DC

## 2021-08-24 MED ORDER — LORATADINE 10 MG PO TABS: 10.0000 mg | ORAL_TABLET | Freq: Every day | ORAL | Status: DC

## 2021-08-24 MED ORDER — ACETAMINOPHEN 325 MG PO TABS: 650.0000 mg | ORAL_TABLET | Freq: Four times a day (QID) | ORAL | Status: AC | PRN

## 2021-08-24 MED ORDER — CLONAZEPAM 0.125 MG PO TBDP
0.1250 mg | ORAL_TABLET | Freq: Every day | ORAL | Status: DC
  Administered 2021-08-25 – 2021-08-26 (×2): 0.125 mg via ORAL
  Filled 2021-08-24 (×2): qty 1

## 2021-08-24 MED ORDER — TRAZODONE HCL 50 MG PO TABS: 50.0000 mg | ORAL_TABLET | Freq: Every day | ORAL | Status: DC

## 2021-08-24 NOTE — Progress Notes (Signed)
[place] PHYSICAL MEDICINE & REHABILITATION PROGRESS NOTE  Subjective/Complaints: Understanding of d/c to SNF tomorrow He has no new complaints or concerns  ROS:  Pt denies SOB, abd pain, CP, N/V/C/D, and vision changes  Objective: Vital Signs: Blood pressure (!) 158/75, pulse 67, temperature 98.8 F (37.1 C), temperature source Oral, resp. rate 18, height 5\' 11"  (1.803 m), weight 74.2 kg, SpO2 95 %. No results found. No results for input(s): WBC, HGB, HCT, PLT in the last 72 hours.  No results for input(s): NA, K, CL, CO2, GLUCOSE, BUN, CREATININE, CALCIUM in the last 72 hours.    Intake/Output Summary (Last 24 hours) at 08/24/2021 1153 Last data filed at 08/24/2021 0832 Gross per 24 hour  Intake 840 ml  Output 950 ml  Net -110 ml        Physical Exam: BP (!) 158/75 (BP Location: Right Arm)   Pulse 67   Temp 98.8 F (37.1 C) (Oral)   Resp 18   Ht 5\' 11"  (1.803 m)   Wt 74.2 kg   SpO2 95%   BMI 22.81 kg/m   General: No acute distress Mood and affect are appropriate Heart: Regular rate and rhythm no rubs murmurs or extra sounds Lungs: Clear to auscultation, breathing unlabored, no rales or wheezes Abdomen: Positive bowel sounds, soft nontender to palpation, nondistended Extremities: No clubbing, cyanosis, or edema Skin: No evidence of breakdown, no evidence of rash Neuro: Makes eye contact with examiner.   Follows commands.   Name and age.   He does have some mild delay in processing. Difficulty with medication management.  Dysarthria, unchanged Motor: RUE/RLE: 4/5 proximal distal LUE: 4-/5 proximal distal LLE: 3/5 throughout Dysmetria LUE and LLE Using Stedy to get to bathroom  Assessment/Plan: 1. Functional deficits which require 3+ hours per day of interdisciplinary therapy in a comprehensive inpatient rehab setting. Physiatrist is providing close team supervision and 24 hour management of active medical problems listed below. Physiatrist and rehab  team continue to assess barriers to discharge/monitor patient progress toward functional and medical goals   Care Tool:  Bathing    Body parts bathed by patient: Right arm, Left arm, Chest, Abdomen, Front perineal area, Right upper leg, Left upper leg, Face   Body parts bathed by helper: Buttocks, Right lower leg, Left lower leg     Bathing assist Assist Level: Moderate Assistance - Patient 50 - 74%     Upper Body Dressing/Undressing Upper body dressing   What is the patient wearing?: Pull over shirt    Upper body assist Assist Level: Minimal Assistance - Patient > 75%    Lower Body Dressing/Undressing Lower body dressing      What is the patient wearing?: Pants     Lower body assist Assist for lower body dressing: Maximal Assistance - Patient 25 - 49%     Toileting Toileting    Toileting assist Assist for toileting: Moderate Assistance - Patient 50 - 74%     Transfers Chair/bed transfer  Transfers assist     Chair/bed transfer assist level: Moderate Assistance - Patient 50 - 74%     Locomotion Ambulation   Ambulation assist   Ambulation activity did not occur: Safety/medical concerns  Assist level: 2 helpers (mod A & +2 w/c follow) Assistive device: Walker-rolling Max distance: 127ft   Walk 10 feet activity   Assist  Walk 10 feet activity did not occur: Safety/medical concerns  Assist level: 2 helpers Assistive device: Walker-Eva   Walk 50 feet activity  Assist Walk 50 feet with 2 turns activity did not occur: Safety/medical concerns  Assist level: 2 helpers Assistive device: Walker-Eva    Walk 150 feet activity   Assist Walk 150 feet activity did not occur: Safety/medical concerns  Assist level: 2 helpers Assistive device: Walker-Eva    Walk 10 feet on uneven surface  activity   Assist Walk 10 feet on uneven surfaces activity did not occur: Safety/medical concerns         Wheelchair     Assist Is the patient using  a wheelchair?: Yes Type of Wheelchair: Manual    Wheelchair assist level: Supervision/Verbal cueing Max wheelchair distance: 6ft    Wheelchair 50 feet with 2 turns activity    Assist        Assist Level: Dependent - Patient 0%   Wheelchair 150 feet activity     Assist      Assist Level: Dependent - Patient 0%    Medical Problem List and Plan: 1.  Left-sided hemiparesis secondary to infarct right posterior lentiform nucleus, thalamus, and tail of the caudate with additional punctate infarct in the lateral left thalamus.   Continue CIR SNF pnd  2.  Impaired mobility: -DVT/anticoagulation:  Pharmaceutical: Change heparin to Lovenox             -antiplatelet therapy: Continue Aspirin 81 mg daily and Plavix 75 mg daily until 10/30, then Plavix alone. 3. Low back pain: Improved. Continue kpad. Discontinue Lyrica, discontinue hydrocodone             Monitor with increased exertion 4. Anxiety: Decrease daytime Klonopin 0.125 mg daily, Prozac 20 mg daily, continue klonopin 0.25mg  HS.              -antipsychotic agents: N/A 5. Neuropsych: This patient is capable of making decisions on his own behalf. 6. Skin/Wound Care: Routine skin checks 7. Fluids/Electrolytes/Nutrition: Routine in and outs 8.  Hypertension.   Continue Norvasc to 10mg .   Increase hydralazine to 100 mg TID- monitor effect  Add back HS klonopin which will help with anxiety and BP       Vitals:   08/23/21 2032 08/24/21 0528  BP: (!) 168/72 (!) 158/75  Pulse: 61 67  Resp: 15 18  Temp: 97.8 F (36.6 C) 98.8 F (37.1 C)  SpO2: 100% 95%   Labile cont to monitor with med changes 9.  Hyperlipidemia.  Continue Zocor/Zetia 10.  COPD with history of tobacco use.  Continue nebulizers as directed as well as Singulair.  Check oxygen saturations every shift 11.  Diabetes mellitus with hyperglycemia.  Hemoglobin A1c 6.7.  Continue Amaryl 0.5 mg twice daily  Elevated on 10/16, will consider medication  adjustments if persistent  Provide dietary education- recommended avoiding added sugar in drinks and meals   CBG (last 3)  Recent Labs    08/23/21 2105 08/24/21 0603 08/24/21 1144  GLUCAP 203* 114* 137*    12.  Right side parotid mass.  Follow-up outpatient ENT 13.  CKD stage III.  Baseline creatinine 1.1-1.3.               Cr up to 1.41- place nursing order to encourage 6-8 glasses of water per day and creatinine has normalized to 1.10, monitor weekly.  Repeat in am  14.  GERD.  Continue Protonix 15. Right breat toe bunion: requested ortho tech eval for orthotic. Discussed benefits of icing.  15. Bradycardia: decrease lopressor to 12.5mg - controlled HR Vitals:   08/23/21 2032 08/24/21 0528  BP: (!) 168/72 (!) 158/75  Pulse: 61 67  Resp: 15 18  Temp: 97.8 F (36.6 C) 98.8 F (37.1 C)  SpO2: 100% 95%   16. Right CMC arthritis: discussed bracing and he defers at this time. Added voltaren gel QID 17. Disturbed sleep  10/29- will try Traozdone for sleep- to help him sleep through- 50 mg QHS for now 18. Left foot swelling: placed nursing order to request elevated and ice 15 minutes three times per day, avoid pressure garments as he feels this worsened swelling. Vascular Ultrasound reviewed with patient and is negative. Lasix 10mg  on 11/2    LOS: 24 days A FACE TO FACE EVALUATION WAS PERFORMED  Barry [name] 08/24/2021, 11:53 AM

## 2021-08-24 NOTE — Progress Notes (Signed)
Speech Language Pathology Daily Session Note  Patient Details  Name: Barry [name] MRN: [id number] Date of Birth: 10-09-1933  Today's Date: 08/24/2021 SLP Individual Time: 1388-7195 SLP Individual Time Calculation (min): 45 min  Short Term Goals: Week 4: SLP Short Term Goal 1 (Week 4): STG=LTG due to ELOS  Skilled Therapeutic Interventions: Pt seen for skilled ST with focus on cognitive communication goals, upright in wheelchair and agreeable to therapeutic tasks. Pt expressing mild disappointment with d/c to SNF vs home with son, however demonstrates increased emergent and anticipatory awareness of current cognitive and physical deficits and not at functional baseline. Pt endorses memory deficits, requires education on ongoing deficits in safety and problem solving. Pt is quite fearful of falling, states son will be getting Life Alert button for him. SLP facilitating activity focused on potential hazard recognition strategies and fall risk prevention by providing min fading to SPV cues for accuracy. Pt states he does keep cell phone in shirt pocket at all times. Pt left in wheelchair with all needs within reach and alarm belt present. Cont ST POC.  Pain Pain Assessment Pain Scale: 0-10 Pain Score: 0-No pain  Therapy/Group: Individual Therapy  [name] 08/24/2021, 11:55 AM

## 2021-08-24 NOTE — Progress Notes (Signed)
Speech Language Pathology Discharge Summary  Patient Details  Name: Barry [name] MRN: [id number] Date of Birth: 12/03/1932  Patient has met 4 of 4 long term goals.  Patient to discharge at [facility] level.   Clinical Impression/Discharge Summary: Pt has made slow but consistent progress during CIR and has met 4 out of 4 long term goals. Initially anticipated pt to discharge at supervision level A for cognition, however due to slow progress and decreased insight/awareness, goals were downgraded to Min A which is the level pt is currently performing. Pt is discharging to SNF environment for continued therapeutic intervention due to remaining cognitive and physical impairments. Pt is benefiting from compensatory strategies for memory impairments including writing important information down and association. Pt provided education regarding recommendations for ongoing ST services to maximize cognitive function and reduce caregiver burden, pt verbalized understanding and states he is thankful for ST intervention during CIR. Recommend 24/7 supervision/assist at discharge due to cognitive impairments.   Care Partner:  Caregiver Able to Provide Assistance: No   Recommendation:  Skilled Nursing facility;24 hour supervision/assistance  Rationale for SLP Follow Up: Maximize cognitive function and independence;Reduce caregiver burden   Reasons for discharge: Discharged from hospital   Patient/Family Agrees with Progress Made and Goals Achieved: Yes    [name] 08/24/2021, 11:53 AM

## 2021-08-24 NOTE — Discharge Summary (Signed)
Physical Therapy Discharge Summary  Patient Details  Name: Barry [name] MRN: [id number] Date of Birth: [birth year]  Patient has met 4 of 9 long term goals due to improved activity tolerance, improved balance, improved postural control, increased strength, ability to compensate for deficits, functional use of  left upper extremity and left lower extremity, improved attention, and improved awareness.  Patient to discharge at an ambulatory level [place].   Family education and training has been performed however family and patient feel that continued rehab is necessary prior to returning home. Patient's care partner unavailable to provide the necessary physical and cognitive assistance at discharge.   Reasons goals not met: Pt requires min to modA for dynamic sitting balance while unsupported due to poor trunk control and righting reactions. He also requires modA for dynamic standing balance due to L truncal lean and poor postural awareness with inability to self correct. He requires modA for bed mobility without hospital bed features and he also requires modA for bed<>chair transfers via squat<>pivot or stand<>pivot transfers. The same goes for car transfers as well. He is able to ambulate short distances with minA and RW but any distance greater than 30f he begins to require modA due to scissoring gait, L lean, and L knee buckling. His progress with functional mobility with primarily limited by poor retention/recall of compensatory strategies, decreased insight into deficits, poor awareness and attention to L hemibody, and L sided weakness.   Recommendation:  Patient will benefit from ongoing skilled PT services in skilled nursing facility setting to continue to advance safe functional mobility, address ongoing impairments in sitting and standing balance, LLE  NMR, gait training, functional transfers and mobility, home safety training, caregiver training, and minimize fall risk.  Equipment: No  equipment provided -  TBD by next level of care.  Reasons for discharge: treatment goals met and discharge from hospital  Patient/family agrees with progress made and goals achieved: Yes  PT Discharge Precautions/Restrictions Precautions Precautions: Fall Precaution Comments: L hemi, L inattention, HOH Restrictions Weight Bearing Restrictions: No Pain Pain Assessment Pain Scale: 0-10 Pain Score: 0-No pain Pain Interference Pain Interference Pain Effect on Sleep: 1. Rarely or not at all Pain Interference with Therapy Activities: 1. Rarely or not at all Pain Interference with Day-to-Day Activities: 1. Rarely or not at all  Vision/Perception  Vision - History Ability to See in Adequate Light: 0 Adequate Vision - Assessment Eye Alignment: Within Functional Limits Perception Perception: Impaired Inattention/Neglect: Does not attend to left side of body Praxis Praxis: Impaired Praxis Impairment Details: Motor planning  Cognition Overall Cognitive Status: Impaired/Different from baseline Arousal/Alertness: Awake/alert Orientation Level: Oriented X4 Year: 2022 Month: October Day of Week: Correct Attention: Focused;Sustained Focused Attention: Appears intact Focused Attention Impairment: Verbal basic;Functional basic Sustained Attention: Appears intact Memory: Impaired Memory Impairment: Decreased recall of new information;Decreased short term memory Decreased Short Term Memory: Verbal basic;Functional basic Awareness: Impaired (impaired but improved) Awareness Impairment: Emergent impairment;Anticipatory impairment Problem Solving: Impaired Problem Solving Impairment: Verbal basic;Functional basic Behaviors: Poor frustration tolerance Safety/Judgment: Impaired (impaired but improved) Sensation Sensation Light Touch: Impaired by gross assessment Hot/Cold: Appears Intact Proprioception: Impaired by gross assessment Stereognosis: Appears Intact Additional Comments:  Diminished distally > proximally. Coordination Gross Motor Movements are Fluid and Coordinated: No Fine Motor Movements are Fluid and Coordinated: No Coordination and Movement Description: L inattention and L hemi limiting Heel Shin Test: Limited due to weakness. Some dyscoordination present within available active ROM.  Motor  Motor Motor: Motor apraxia;Hemiplegia;Abnormal postural alignment  and control Motor - Discharge Observations: Improved since date of evaluation but continues to exhibit L lean in sitting and standing, poor postural control/awareness, L inattention, and L hemi.  Mobility Bed Mobility Bed Mobility: Rolling Right;Rolling Left;Supine to Sit;Sit to Supine Rolling Right: Supervision/verbal cueing Rolling Left: Supervision/Verbal cueing Supine to Sit: Moderate Assistance - Patient 50-74% Sit to Supine: Moderate Assistance - Patient 50-74% Transfers Transfers: Sit to Stand;Stand to Sit;Squat Pivot Transfers Sit to Stand: Minimal Assistance - Patient > 75% Stand to Sit: Minimal Assistance - Patient > 75% Squat Pivot Transfers: Moderate Assistance - Patient 50-74% Transfer (Assistive device): Rolling walker (RW for sit<>stand transfers. no AD for squat<>pivot transfers) Locomotion  Gait Ambulation: Yes Gait Assistance: Minimal Assistance - Patient > 75%;Moderate Assistance - Patient 50-74% Gait Distance (Feet): 150 Feet Assistive device: Rolling walker Gait Assistance Details: Tactile cues for initiation;Tactile cues for sequencing;Tactile cues for weight shifting;Tactile cues for posture;Tactile cues for placement;Visual cues for safe use of DME/AE;Visual cues/gestures for precautions/safety;Visual cues/gestures for sequencing;Verbal cues for sequencing;Verbal cues for technique;Verbal cues for precautions/safety;Verbal cues for gait pattern;Verbal cues for safe use of DME/AE;Manual facilitation for weight shifting Gait Gait: Yes Gait Pattern: Impaired Gait Pattern:  Step-through pattern;Decreased step length - left;Decreased stance time - left;Decreased hip/knee flexion - left;Decreased weight shift to left;Scissoring;Left flexed knee in stance;Right flexed knee in stance;Lateral hip instability;Lateral trunk lean to left;Narrow base of support;Poor foot clearance - left Gait velocity: Adducted - left Stairs / Additional Locomotion Stairs: Yes Stairs Assistance: Minimal Assistance - Patient > 75%;Moderate Assistance - Patient 50 - 74% Stair Management Technique: Two rails Number of Stairs: 4 Height of Stairs: 6  W/c mobility 161f supervision using BUE's to propel. Requires assistance for part management. Trunk/Postural Assessment  Cervical Assessment Cervical Assessment: Exceptions to [facility] head) Thoracic Assessment Thoracic Assessment: Exceptions to [facility](rounded shoulders and kyphosis) Lumbar Assessment Lumbar Assessment: Exceptions to [facility](ridgid posterior pelvic tilt) Postural Control Postural Control: Deficits on evaluation Trunk Control: L lean in sitting and standing with difficulty correcting despite max multi-modal cues. If correction is made, he's unable to maintain for >5 seconds. Righting Reactions: delayed and inadequate Protective Responses: delayed and inadequate  Balance Balance Balance Assessed: Yes Static Sitting Balance Static Sitting - Balance Support: Feet supported;No upper extremity supported Static Sitting - Level of Assistance: 5: Stand by assistance Dynamic Sitting Balance Dynamic Sitting - Balance Support: Feet unsupported;No upper extremity supported Dynamic Sitting - Level of Assistance: 3: Mod assist Static Standing Balance Static Standing - Balance Support: Bilateral upper extremity supported Static Standing - Level of Assistance: 4: Min assist Dynamic Standing Balance Dynamic Standing - Balance Support: During functional activity Dynamic Standing - Level of Assistance: 3: Mod assist;2: Max  assist Extremity Assessment      RLE Assessment RLE Assessment: Exceptions to [facility] Strength RLE Overall Strength: Deficits Right Hip Flexion: 4/5 Right Knee Flexion: 4/5 Right Knee Extension: 4/5 Right Ankle Dorsiflexion: 4/5 Right Ankle Plantar Flexion: 4-/5 LLE Assessment LLE Assessment: Exceptions to [facility] Strength LLE Overall Strength: Deficits Left Hip Flexion: 3-/5 Left Knee Flexion: 3+/5 Left Knee Extension: 4-/5 Left Ankle Dorsiflexion: 3+/5 Left Ankle Plantar Flexion: 2+/5   Landy Dunnavant P Nakeisha Greenhouse PT, DPT 08/24/2021, 10:38 AM

## 2021-08-24 NOTE — Progress Notes (Signed)
Physical Therapy Session Note  Patient Details  Name: Barry [name] MRN: [id number] Date of Birth: 1933/04/23  Today's Date: 08/24/2021 PT Individual Time: 2761-4709 PT Individual Time Calculation (min): 73 min   Short Term Goals: Week 3:  PT Short Term Goal 1 (Week 3): STG = LTG due to ELOS  Skilled Therapeutic Interventions/Progress Updates:     Pt in w/c to start session - agreeable to PT tx without reports of pain. Donned socks and tennis shoes with totalA for time. Transported to main rehab hallway and then worked on Conservation officer, historic buildings. He ambulated 3x172ft with minA (fading to modA after ~67ft) and RW with +2 assist for w/c follow for safety. Continues to demonstrate scissoring gait pattern with adduction of LLE and inconsistent step lengths with forward trunk lean - max cues fading to mod with gait trials with cues for corrections. Instructed in functional stair training where he navigated 4 + 4 + 4 steps (seated rest) with modA overall with 2 hand rails with +2 assist on standby. Cues for monitoring L foot placement and general sequencing throughout. Next, worked on dynamic standing balance with instruction for stepping over 1inch hurdle onto visual target with RLE, targeting hip abductors with lateral stepping - required modA for standing balance with BUE support and multiple failed attempts with foot placement as well as some LOB posteriorly. Pt completed squat<>pivot transfers with modA overall during session. Instructed in w/c propulsion where he propelled himself ~159ft with supervision using BUE's - cues needed for UE placement for propulsion and symmetrical propelling. Pt completed session seated in w/c with safety belt alarm on and all needs within reach.   Therapy Documentation Precautions:  Precautions Precautions: Fall Precaution Comments: L hemi, L inattention, HOH Restrictions Weight Bearing Restrictions: No  Therapy/Group: Individual Therapy  Arpan Eskelson P  Aseret Hoffman PT 08/24/2021, 10:53 AM

## 2021-08-24 NOTE — Discharge Summary (Signed)
Physician Discharge Summary  Patient ID: Barry [name] MRN: [id number] DOB/AGE: [birth year] 87 y.o.  Admit date: 07/31/2021 Discharge date: 08/25/2021  Discharge Diagnoses:  Principal Problem:   Right thalamic infarction [facility]) DVT prophylaxis Hypertension Hyperlipidemia COPD with history of tobacco abuse Diabetes mellitus Anxiety/depression Right side parotid mass CKD stage III GERD CAD  Discharged Condition: Stable  Significant Diagnostic Studies: MR BRAIN WO CONTRAST  Result Date: 07/27/2021 CLINICAL DATA:  Neuro deficit, stroke suspected EXAM: MRI HEAD WITHOUT CONTRAST TECHNIQUE: Multiplanar, multiecho pulse sequences of the brain and surrounding structures were obtained without intravenous contrast. COMPARISON:  Same day CT head, MRI 12/17/2018. FINDINGS: Brain: Restricted diffusion in the right posterior lentiform nucleus, right thalamus, and tail of the caudate (series 5, image 18, with additional punctate infarct lateral left thalamus/medial left lentiform nucleus (series 5, image 18). Possible punctate focus in the left lentiform nucleus (series 5, image 17), with possible ADC correlate. Confluent T2 hyperintense signal in the periventricular white matter and pons, likely the sequela of severe chronic small vessel ischemic disease. Foci of susceptibility in the posterior left frontal lobe (series 9, images 52 and 47), likely sequela of remote hypertensive microhemorrhage. Vascular: Normal flow voids. Skull and upper cervical spine: Normal marrow signal. Sinuses/Orbits: Negative.  Status post bilateral lens replacements. Other: Fluid throughout the left mastoid air cells. IMPRESSION: Acute infarcts in the right posterior lentiform nucleus, thalamus, and tail of the caudate, with additional punctate infarct in the lateral left thalamus and possibly the left lentiform nucleus. Given multiple vascular territories, consider an embolic etiology. These results were called by telephone  at the time of interpretation on 07/27/2021 at 6:02 PM to provider [facility] , who verbally acknowledged these results. Electronically Signed   By: Merilyn Baba M.D.   On: 07/27/2021 18:02   ECHOCARDIOGRAM COMPLETE  Result Date: 07/28/2021    ECHOCARDIOGRAM REPORT   Patient Name:   Barry [name] Date of Exam: 07/28/2021 Medical Rec #:  [id number]         Height:       71.0 in Accession #:    [id number]        Weight:       165.8 lb Date of Birth:  16-Nov-1932        BSA:          1.947 m Patient Age:    43 years          BP:           186/64 mmHg Patient Gender: M                 HR:           58 bpm. Exam Location:  [facility] Procedure: 2D Echo, Cardiac Doppler and Color Doppler Indications:    Stroke  History:        Patient has prior history of Echocardiogram examinations, most                 recent 04/09/2020. CAD, Stroke and COPD, Arrythmias:PVC,                 Signs/Symptoms:Chest Pain; Risk Factors:Hypertension, Diabetes,                 Dyslipidemia and Current Smoker. AAA.  Sonographer:    Wenda Low Referring Phys: [id number] [place] D [place]  1. Left ventricular ejection fraction, by estimation, is 60 to 65%. The left ventricle has normal function. The left ventricle has no  regional wall motion abnormalities. There is mild left ventricular hypertrophy. Left ventricular diastolic parameters are consistent with Grade I diastolic dysfunction (impaired relaxation).  2. Right ventricular systolic function is normal. The right ventricular size is normal.  3. The mitral valve is normal in structure. No evidence of mitral valve regurgitation. No evidence of mitral stenosis.  4. The aortic valve is normal in structure. There is moderate calcification of the aortic valve. There is moderate thickening of the aortic valve. Aortic valve regurgitation is not visualized. Mild to moderate aortic valve stenosis. Aortic valve area, by VTI measures 1.31 cm. Aortic valve mean gradient measures 14.0  mmHg. Aortic valve Vmax measures 2.64 m/s.  5. The inferior vena cava is normal in size with greater than 50% respiratory variability, suggesting right atrial pressure of 3 mmHg. Conclusion(s)/Recommendation(s): No intracardiac source of embolism detected on this transthoracic study. A transesophageal echocardiogram is recommended to exclude cardiac source of embolism if clinically indicated. FINDINGS  Left Ventricle: Left ventricular ejection fraction, by estimation, is 60 to 65%. The left ventricle has normal function. The left ventricle has no regional wall motion abnormalities. The left ventricular internal cavity size was normal in size. There is  mild left ventricular hypertrophy. Left ventricular diastolic parameters are consistent with Grade I diastolic dysfunction (impaired relaxation). Right Ventricle: The right ventricular size is normal. No increase in right ventricular wall thickness. Right ventricular systolic function is normal. Left Atrium: Left atrial size was normal in size. Right Atrium: Right atrial size was normal in size. Pericardium: There is no evidence of pericardial effusion. Mitral Valve: The mitral valve is normal in structure. Mild mitral annular calcification. No evidence of mitral valve regurgitation. No evidence of mitral valve stenosis. MV peak gradient, 5.7 mmHg. The mean mitral valve gradient is 2.0 mmHg. Tricuspid Valve: The tricuspid valve is normal in structure. Tricuspid valve regurgitation is not demonstrated. No evidence of tricuspid stenosis. Aortic Valve: The aortic valve is normal in structure. There is moderate calcification of the aortic valve. There is moderate thickening of the aortic valve. Aortic valve regurgitation is not visualized. Mild to moderate aortic stenosis is present. Aortic valve mean gradient measures 14.0 mmHg. Aortic valve peak gradient measures 27.9 mmHg. Aortic valve area, by VTI measures 1.31 cm. Pulmonic Valve: The pulmonic valve was normal in  structure. Pulmonic valve regurgitation is not visualized. No evidence of pulmonic stenosis. Aorta: The aortic root is normal in size and structure. Venous: The inferior vena cava is normal in size with greater than 50% respiratory variability, suggesting right atrial pressure of 3 mmHg. IAS/Shunts: No atrial level shunt detected by color flow Doppler.  LEFT VENTRICLE PLAX 2D LVIDd:         4.00 cm      Diastology LVIDs:         2.80 cm      LV e' medial:    6.85 cm/s LV PW:         1.40 cm      LV E/e' medial:  9.7 LV IVS:        1.30 cm      LV e' lateral:   7.94 cm/s LVOT diam:     2.00 cm      LV E/e' lateral: 8.4 LV SV:         79 LV SV Index:   41 LVOT Area:     3.14 cm  LV Volumes (MOD) LV vol d, MOD A2C: 99.3 ml LV vol d, MOD A4C:  121.0 ml LV vol s, MOD A2C: 53.2 ml LV vol s, MOD A4C: 57.2 ml LV SV MOD A2C:     46.1 ml LV SV MOD A4C:     121.0 ml LV SV MOD BP:      56.8 ml RIGHT VENTRICLE RV Basal diam:  3.50 cm RV Mid diam:    3.00 cm RV S prime:     14.70 cm/s TAPSE (M-mode): 2.6 cm LEFT ATRIUM             Index        RIGHT ATRIUM           Index LA diam:        3.30 cm 1.69 cm/m   RA Area:     19.50 cm LA Vol (A2C):   70.3 ml 36.11 ml/m  RA Volume:   52.90 ml  27.17 ml/m LA Vol (A4C):   46.2 ml 23.73 ml/m LA Biplane Vol: 58.8 ml 30.20 ml/m  AORTIC VALVE                     PULMONIC VALVE AV Area (Vmax):    1.16 cm      PV Vmax:       1.05 m/s AV Area (Vmean):   1.08 cm      PV Peak grad:  4.4 mmHg AV Area (VTI):     1.31 cm AV Vmax:           264.25 cm/s AV Vmean:          175.250 cm/s AV VTI:            0.608 m AV Peak Grad:      27.9 mmHg AV Mean Grad:      14.0 mmHg LVOT Vmax:         97.90 cm/s LVOT Vmean:        60.100 cm/s LVOT VTI:          0.253 m LVOT/AV VTI ratio: 0.42  AORTA Ao Root diam: 3.00 cm MITRAL VALVE MV Area (PHT): 1.72 cm     SHUNTS MV Area VTI:   2.03 cm     Systemic VTI:  0.25 m MV Peak grad:  5.7 mmHg     Systemic Diam: 2.00 cm MV Mean grad:  2.0 mmHg MV Vmax:        1.19 m/s MV Vmean:      57.0 cm/s MV Decel Time: 441 msec MV E velocity: 66.40 cm/s MV A velocity: 108.00 cm/s MV E/A ratio:  0.61 Candee Furbish MD Electronically signed by Candee Furbish MD Signature Date/Time: 07/28/2021/2:58:35 PM    Final    ECHO TEE  Result Date: 07/29/2021    TRANSESOPHOGEAL ECHO REPORT   Patient Name:   Barry [name] Date of Exam: 07/29/2021 Medical Rec #:  [id number]         Height:       71.0 in Accession #:    [id number]        Weight:       165.8 lb Date of Birth:  20-Apr-1933        BSA:          1.947 m Patient Age:    29 years          BP:           220/63 mmHg Patient Gender: M  HR:           70 bpm. Exam Location:  [facility] Procedure: Transesophageal Echo, Cardiac Doppler and Color Doppler Indications:    Stroke  History:        Patient has prior history of Echocardiogram examinations, most                 recent 07/28/2021. CAD, COPD, Arrythmias:PVC; Risk                 Factors:Hypertension, Diabetes, Dyslipidemia and Current Smoker.  Sonographer:    Alvino Chapel RCS Referring Phys: [phone] [place]: After discussion of the risks and benefits of a TEE, an informed consent was obtained from the patient. TEE procedure time was 15 minutes. The transesophogeal probe was passed without difficulty through the esophogus of the patient. Sedation performed by different physician. The patient was monitored while under deep sedation. Anesthestetic sedation was provided intravenously by Anesthesiology: 250mg  of Propofol, 50mg  of Lidocaine. Image quality was excellent. The patient's vital signs; including heart rate, blood pressure, and oxygen saturation; remained stable throughout the procedure. The patient developed no complications during the procedure. IMPRESSIONS  1. Left ventricular ejection fraction, by estimation, is 60 to 65%. The left ventricle has normal function.  2. Right ventricular systolic function is normal. The right ventricular size is normal.   3. No left atrial/left atrial appendage thrombus was detected. The LAA emptying velocity was 90 cm/s.  4. The mitral valve is grossly normal. Trivial mitral valve regurgitation. No evidence of mitral stenosis.  5. The aortic valve is tricuspid. There is moderate calcification of the aortic valve. There is moderate thickening of the aortic valve. Aortic valve regurgitation is not visualized. Moderate aortic valve stenosis. Aortic valve area, by VTI measures 1.31 cm. Aortic valve mean gradient measures 23.3 mmHg. Aortic valve Vmax measures 3.10 m/s.  6. There is Moderate (Grade III) layered plaque involving the descending aorta and transverse aorta.  7. Agitated saline contrast bubble study was negative, with no evidence of any interatrial shunt. Conclusion(s)/Recommendation(s): No LA/LAA thrombus identified. Negative bubble study for interatrial shunt. No intracardiac source of embolism detected on this on this transesophageal echocardiogram. FINDINGS  Left Ventricle: Left ventricular ejection fraction, by estimation, is 60 to 65%. The left ventricle has normal function. The left ventricular internal cavity size was normal in size. Right Ventricle: The right ventricular size is normal. No increase in right ventricular wall thickness. Right ventricular systolic function is normal. Left Atrium: Left atrial size was normal in size. No left atrial/left atrial appendage thrombus was detected. The LAA emptying velocity was 90 cm/s. Right Atrium: Right atrial size was normal in size. Prominent Eustachian valve. Pericardium: Trivial pericardial effusion is present. Mitral Valve: The mitral valve is grossly normal. Trivial mitral valve regurgitation. No evidence of mitral valve stenosis. Tricuspid Valve: The tricuspid valve is grossly normal. Tricuspid valve regurgitation is mild . No evidence of tricuspid stenosis. Aortic Valve: The aortic valve is tricuspid. There is moderate calcification of the aortic valve. There is  moderate thickening of the aortic valve. Aortic valve regurgitation is not visualized. Moderate aortic stenosis is present. Aortic valve mean gradient measures 23.3 mmHg. Aortic valve peak gradient measures 38.5 mmHg. Aortic valve area, by VTI measures 1.31 cm. Pulmonic Valve: The pulmonic valve was grossly normal. Pulmonic valve regurgitation is not visualized. No evidence of pulmonic stenosis. Aorta: The aortic root and ascending aorta are structurally normal, with no evidence of dilitation. There is moderate (  Grade III) layered plaque involving the descending aorta and transverse aorta. Venous: The left upper pulmonary vein, left lower pulmonary vein, right lower pulmonary vein and right upper pulmonary vein are normal. IAS/Shunts: No atrial level shunt detected by color flow Doppler. Agitated saline contrast was given intravenously to evaluate for intracardiac shunting. Agitated saline contrast bubble study was negative, with no evidence of any interatrial shunt.  LEFT VENTRICLE PLAX 2D LVOT diam:     2.20 cm LV SV:         86 LV SV Index:   44 LVOT Area:     3.80 cm  AORTIC VALVE AV Area (Vmax):    1.53 cm AV Area (Vmean):   1.52 cm AV Area (VTI):     1.31 cm AV Vmax:           310.36 cm/s AV Vmean:          231.530 cm/s AV VTI:            0.660 m AV Peak Grad:      38.5 mmHg AV Mean Grad:      23.3 mmHg LVOT Vmax:         124.89 cm/s LVOT Vmean:        92.445 cm/s LVOT VTI:          0.227 m LVOT/AV VTI ratio: 0.34  AORTA Ao Root diam: 3.48 cm Ao Asc diam:  3.36 cm TRICUSPID VALVE TR Peak grad:   5.8 mmHg TR Vmax:        120.00 cm/s  SHUNTS Systemic VTI:  0.23 m Systemic Diam: 2.20 cm Eleonore Chiquito MD Electronically signed by Eleonore Chiquito MD Signature Date/Time: 07/29/2021/4:27:23 PM    Final    CT HEAD CODE STROKE WO CONTRAST  Result Date: 07/27/2021 CLINICAL DATA:  Code stroke. Left arm weakness starting at 9:30 this morning EXAM: CT HEAD WITHOUT CONTRAST TECHNIQUE: Contiguous axial images were  obtained from the base of the skull through the vertex without intravenous contrast. COMPARISON:  CT head 10/27/2018, brain MRI 03/13/2021 FINDINGS: Brain: There is no evidence of acute intracranial hemorrhage, extra-axial fluid collection, or acute infarct. There is mild parenchymal volume loss. Confluent hypodensity throughout the subcortical and periventricular white matter likely reflects sequela of advanced chronic white matter microangiopathy. The ventricles are stable in size. There is no mass lesion.  There is no midline shift. Vascular: There is calcification of the bilateral cavernous ICAs and vertebral arteries. There is no dense vessel. Skull: Normal. Negative for fracture or focal lesion. Sinuses/Orbits: The paranasal sinuses are clear. Bilateral lens implants are in place. The globes and orbits are otherwise unremarkable. Other: A left mastoid effusion is again seen. ASPECTS [facility] Stroke Program Early CT Score) - Ganglionic level infarction (caudate, lentiform nuclei, internal capsule, insula, M1-M3 cortex): 7 - Supraganglionic infarction (M4-M6 cortex): 3 Total score (0-10 with 10 being normal): 10 IMPRESSION: 1. No acute intracranial pathology. 2. ASPECTS is 10 3. Mild parenchymal volume loss and advanced chronic white matter microangiopathy. These results were called by telephone at the time of interpretation on 07/27/2021 at 12:27 pm to provider [facility] , who verbally acknowledged these results. Electronically Signed   By: Valetta Mole M.D.   On: 07/27/2021 12:27   VAS [place] LOWER EXTREMITY VENOUS (DVT)  Result Date: 08/18/2021  Lower Venous DVT Study Patient Name:  Barry [name]  Date of Exam:   08/18/2021 Medical Rec #: [id number]  Accession #:    [id number] Date of Birth: 02-Oct-1933         Patient Gender: M Patient Age:   68 years Exam Location:  [facility] Procedure:      VAS [place] LOWER EXTREMITY VENOUS (DVT) Referring Phys: Leeroy Cha  --------------------------------------------------------------------------------  Indications: Left foot edema.  Comparison Study: No prior studies. Performing Technologist: Darlin Coco RDMS, RVT  Examination Guidelines: A complete evaluation includes B-mode imaging, spectral Doppler, color Doppler, and power Doppler as needed of all accessible portions of each vessel. Bilateral testing is considered an integral part of a complete examination. Limited examinations for reoccurring indications may be performed as noted. The reflux portion of the exam is performed with the patient in reverse Trendelenburg.  +-----+---------------+---------+-----------+----------+--------------+ RIGHTCompressibilityPhasicitySpontaneityPropertiesThrombus Aging +-----+---------------+---------+-----------+----------+--------------+ CFV  Full           Yes      Yes                                 +-----+---------------+---------+-----------+----------+--------------+   +---------+---------------+---------+-----------+----------+--------------+ LEFT     CompressibilityPhasicitySpontaneityPropertiesThrombus Aging +---------+---------------+---------+-----------+----------+--------------+ CFV      Full           Yes      Yes                                 +---------+---------------+---------+-----------+----------+--------------+ SFJ      Full                                                        +---------+---------------+---------+-----------+----------+--------------+ FV Prox  Full                                                        +---------+---------------+---------+-----------+----------+--------------+ FV Mid   Full                                                        +---------+---------------+---------+-----------+----------+--------------+ FV DistalFull                                                         +---------+---------------+---------+-----------+----------+--------------+ PFV      Full                                                        +---------+---------------+---------+-----------+----------+--------------+ POP      Full           Yes      Yes                                 +---------+---------------+---------+-----------+----------+--------------+  PTV      Full                                                        +---------+---------------+---------+-----------+----------+--------------+ PERO     Full                                                        +---------+---------------+---------+-----------+----------+--------------+ Gastroc  Full                                                        +---------+---------------+---------+-----------+----------+--------------+    Summary: RIGHT: - No evidence of common femoral vein obstruction.  LEFT: - There is no evidence of deep vein thrombosis in the lower extremity.  - No cystic structure found in the popliteal fossa.  *See table(s) above for measurements and observations. Electronically signed by Deitra Mayo MD on 08/18/2021 at 5:06:24 PM.    Final    CT ANGIO HEAD NECK W WO CM (CODE STROKE)  Result Date: 07/27/2021 CLINICAL DATA:  Left-sided weakness EXAM: CT ANGIOGRAPHY HEAD AND NECK TECHNIQUE: Multidetector CT imaging of the head and neck was performed using the standard protocol during bolus administration of intravenous contrast. Multiplanar CT image reconstructions and MIPs were obtained to evaluate the vascular anatomy. Carotid stenosis measurements (when applicable) are obtained utilizing NASCET criteria, using the distal internal carotid diameter as the denominator. CONTRAST:  64mL OMNIPAQUE IOHEXOL 350 MG/ML SOLN COMPARISON:  Same-day noncontrast CT head, MRA head/neck 03/13/2021 FINDINGS: CTA NECK FINDINGS Aortic arch: There is calcified atherosclerotic plaque of the aortic arch and in the  proximal great vessels. There is multifocal irregularity of the right subclavian artery without high-grade stenosis or occlusion. There is no evidence of dissection or aneurysm. Right carotid system: There is scattered calcified atherosclerotic plaque in the right common carotid artery without hemodynamically significant stenosis. There is soft and calcified atherosclerotic plaque of the proximal right internal carotid artery resulting in up to 30-40% stenosis. There is no evidence of dissection or aneurysm. Left carotid system: There is mild calcified atherosclerotic plaque in the left common carotid artery without hemodynamically significant stenosis. There is mild calcified atherosclerotic plaque in the proximal left internal carotid artery without hemodynamically significant stenosis or occlusion. There is no dissection or aneurysm. Vertebral arteries: There is mild calcified atherosclerotic plaque in the proximal right vertebral artery and at the V2/V3 junction without hemodynamically significant stenosis or occlusion. The remainder of the right vertebral artery is patent. There is no dissection or aneurysm. The left vertebral artery is2 smaller than the right, a normal variant. There is no hemodynamically significant stenosis or occlusion. There is no dissection or aneurysm. Skeleton: There is multilevel degenerative change of the cervical spine, most advanced at C5-C6. Other neck: There is a subcentimeter left thyroid nodule. Small bilateral palatine tonsilliths are noted. There is a 1.0 cm enhancing lesion in the right parotid gland. Upper chest: There is emphysema in the lung apices. There is cavitation in the medial left  upper lobe, incompletely imaged but also present on the prior CT chest from 12/21/2018. Review of the MIP images confirms the above findings CTA HEAD FINDINGS Anterior circulation: There is calcified atherosclerotic plaque of the bilateral intracranial carotid arteries resulting in mild to  moderate stenosis on the right and mild stenosis on the left, similar to the prior study. There is no evidence of occlusion. The bilateral MCAs are patent. The bilateral ACAs are patent. There is no aneurysm. Posterior circulation: There is mild calcified atherosclerotic plaque of the right V4 segment without hemodynamically significant stenosis or occlusion. The left V4 segment is patent. PICA is patent bilaterally. There is mild focal stenosis of the the proximal left P2 segment (8-117), similar to the prior study. Basilar artery is patent. The bilateral PCAs are patent. There is no aneurysm. Venous sinuses: Patent. Anatomic variants: None. Review of the MIP images confirms the above findings IMPRESSION: 1. Mild atherosclerotic plaque in the bilateral carotid bulbs resulting in up to 30-40% stenosis on the right. Patent vertebral arteries. 2. Calcified atherosclerotic plaque of the bilateral intracranial ICAs resulting in mild to moderate stenosis on the right and mild stenosis on the left, not significantly changed. 3. Mild focal stenosis of the proximal left P2 segment. Otherwise, patent intracranial vasculature. 4. 1.0 cm enhancing lesion in the right parotid gland is indeterminate with differential including both benign and malignant etiologies. Recommend ENT referral. Aortic Atherosclerosis (ICD10-I70.0) and Emphysema (ICD10-J43.9). Electronically Signed   By: Valetta Mole M.D.   On: 07/27/2021 13:19    Labs:  Basic Metabolic Panel: No results for input(s): NA, K, CL, CO2, GLUCOSE, BUN, CREATININE, CALCIUM, MG, PHOS in the last 168 hours.  CBC: No results for input(s): WBC, NEUTROABS, HGB, HCT, MCV, PLT in the last 168 hours.  CBG: Recent Labs  Lab 08/24/21 0603 08/24/21 1144 08/24/21 1711 08/24/21 2104 08/25/21 0617  GLUCAP 114* 137* 94 178* 144*   Family history.  Father with emphysema Brother with lung cancer Sister with lung disease Sister with kidney disease Sister with diabetes.   Negative for colon cancer esophageal cancer or rectal cancer Brief HPI:   Barry [name] is a 87 y.o. right-handed male with history of CAD maintained on low-dose aspirin CKD stage III COPD with tobacco use diabetes mellitus hypertension GERD.  Patient lives alone reportedly independent prior to admission using occasional cane.  Presented 07/27/2021 to [facility] with acute onset of left-sided weakness.  CT/MRI showed acute right posterior lentiform nucleus, thalamus, anterior caudate infarct with additional punctate infarct in the lateral left thalamus and possible the left lentiform.  CT angiogram head and neck with mild atherosclerotic plaque of the bilateral carotid bulbs resulting in 30 to 40% stenosis on the right.  1 cm enhancing lesion in the right parotid gland indeterminate recommend ENT follow-up as outpatient.  MRA of the head showed no significant proximal stenosis aneurysm or occlusion.  Echocardiogram with ejection fraction of 60 to 62% grade 1 diastolic dysfunction.  TEE showed no left atrial appendage or thrombus detected.  No PFO.  Maintained on aspirin and Plavix for CVA prophylaxis.  Subcutaneous heparin for DVT prophylaxis.  Therapy evaluations completed due to patient's left-sided weakness was admitted for a comprehensive rehab program.   Hospital Course: Barry [name] was admitted to rehab 07/31/2021 for inpatient therapies to consist of PT, ST and OT at least three hours five days a week. Past admission physiatrist, therapy team and rehab RN have worked together to provide customized collaborative  inpatient rehab.  Pertaining to patient's right posterior lentiform/thalamus and tail of caudate infarct with additional punctate infarct left lateral thalamus remained stable aspirin and Plavix until 08/16/2021 and then was transition to Plavix alone.  Subtenons Lovenox for DVT prophylaxis.  Patient did have some low back pain using a K pad initially on hydrocodone discontinued.   Anxiety with depression using Klonopin Prozac with emotional support provided.  Blood pressure maintained on Norvasc's as well as hydralazine and Toprol-XL 12.5 mg daily.  History of COPD monitoring of oxygen saturations maintained on Singulair.  Zocor ongoing for hyperlipidemia as well as Zetia.  Blood sugars controlled on low-dose Amaryl all with diabetic teaching.  Patient did have some insomnia doing well with trazodone.   Blood pressures were monitored on TID basis and controlled  Diabetes has been monitored with ac/hs CBG checks and SSI was use prn for tighter BS control.    Rehab course: During patient's stay in rehab weekly team conferences were held to monitor patient's progress, set goals and discuss barriers to discharge. At admission, patient required max assist for feet rolling walker mod max assist supine to sit  Physical exam.  Blood pressure 179/84 pulse 68 temperature 98 respiration 20 oxygen saturation 90% room air Constitutional.  No acute distress HEENT Head.  Normocephalic and atraumatic Eyes.  Pupils round and reactive to light no discharge without nystagmus Neck.  Supple nontender no JVD without thyromegaly Cardiac regular rate and rhythm not extra sounds or murmur heard Abdomen.  Soft nontender positive bowel sounds without rebound Respiratory effort normal no respiratory distress without wheeze Musculoskeletal.  No edema or tenderness extremities Skin.  Warm and dry Neurologic.  Alert no acute distress makes eye contact with examiner follows commands mildly dysarthric but fully intelligible Motor.  Right upper right lower extremity 4/5 proximal distal Left upper extremity 4 -/5 proximal distal Left lower extremity 1+/5 proximal distal  He/She  has had improvement in activity tolerance, balance, postural control as well as ability to compensate for deficits. He/She has had improvement in functional use RUE/LUE  and RLE/LLE as well as improvement in awareness.  Noted  motor apraxia decreased awareness of left lower extremity.  Demonstrated some scissoring gait pattern.  Noted left inattention.  Patient able to doff sleepwear and underwear with contact-guard assist dons clean underclothing with minimal assist minimal assist for pants pulling over hips contact-guard.  Completed bilateral hand dexterity design copy task x2 with minimal cues.  Completed standing stepping activities focus on left knee control and weight shift.  Patient was demonstrating increasing emergent and anticipatory awareness of current cognitive physical deficits.  Endorses memory deficits requiring some education.  Due to limited support patient was discharged to skilled nursing facility.       Disposition: Discharge to skilled nursing facility    Diet: Carb modified  Special Instructions: No driving smoking or alcohol  Medications at discharge 1.  Tylenol as needed 2.  Norvasc 10 mg p.o. daily 3.  Lacri-Lube as needed dry eyes 4.  B complex with vitamin C1 tablet daily 5.  Klonopin 0.125 mg p.o. daily 6.  Plavix 25 mg p.o. daily 7.  Voltaren gel 2 g 4 times daily 8.  Zetia 10 mg p.o. daily 9.  Prozac 20 mg p.o. daily 10.  Amaryl 0.5 mg p.o. twice daily 11.  Hydralazine 100 mg p.o. every 8 hours 12.  Claritin 10 mg p.o. daily 13.  Magnesium gluconate 250 mg p.o. nightly 14.  Toprol-XL 12.5 mg p.o.  daily 15.  Singulair 10 mg p.o. nightly 16.  Protonix 40 mg p.o. daily 17.  Zocor 40 mg p.o. nightly 18.  Trazodone 50 mg p.o. nightly 19.  Vitamin B12 1000 mcg p.o. daily 20 .Requip 0.25 mg QHS   30-35 minutes were spent completing discharge summary and discharge planning  Discharge Instructions     Ambulatory referral to Neurology   Complete by: As directed    An appointment is requested in approximately: 4 weeks right posterior lentiform nucleus/thalamic infarction   Ambulatory referral to Physical Medicine Rehab   Complete by: As directed    Moderate complexity  follow-up 1 month right posterior lentiform nucleus, thalamus infarction.  Patient for skilled nursing facility placement        Contact information for follow-up providers     Raulkar, Clide Deutscher, MD Follow up.   Specialty: Physical Medicine and Rehabilitation Why: 09/15/21 please arrive at 12:40pm for 1:00pm appointment, thank you! Contact information: [address] [place] [address] [phone]         Geralynn Rile, MD Follow up.   Specialties: Cardiology, Internal Medicine, Radiology Why: Call for appointment as needed Contact information: [place] [address] [phone]              Contact information for after-discharge care     Destination     [facility] Preferred SNF .   Service: Skilled Nursing Contact information: [place] [phone]                     Signed: Cathlyn Parsons 08/25/2021, 7:12 AM

## 2021-08-24 NOTE — Progress Notes (Signed)
Occupational Therapy Session Note  Patient Details  Name: Barry [name] MRN: [id number] Date of Birth: 06/17/1933  Today's Date: 08/24/2021 OT Individual Time: 5320-2334 OT Individual Time Calculation (min): 53 min    Short Term Goals:  Week 3:  OT Short Term Goal 1 (Week 3): STGs=LTGs due to ELOS  Skilled Therapeutic Interventions/Progress Updates:    Pt sitting up in w/c, no c/o pain, frequently stating "my leg is just so weak, I dont know why" needing encouragement throughout session to increase awareness of current functional status and promote participation to pts full abilities.  Pt transported to dayroom and completed stand pivot with min-mod assist and step by step multimodal cues for step sequence and RW management as well as forward weight shifting.  Pt completed 5 minutes on nustep at level 5 then 2 minutes on level 1 due to reported fatigue.  Pt completed stand pivot to w/c with same level assist as previously mentioned.  Transported to large gym and completed another stand pivot to EOM this time requiring only min assist and needing less multimodal cueing.  Pt completed standing rebounder toss and catch 3 x 10 reps to facilitate increased reaction speed, self righting skills, and protective response in standing.  Pt able to complete sit<>stand without AD using only hand held assist.  Toss and catch dynamic standing balance without AD completed with CGA and occasional min assist.  Pt reporting light headedness at end of task therefore vitals assessed and all WNL.  Pt requesting to return to room and back to bed. Stand pivot with RW with min assist.  Sit to supine with supervision. Call bell in reach, bed alarm on.  Nurse made aware of pts reports of light headedness.    Therapy Documentation Precautions:  Precautions Precautions: Fall Precaution Comments: L hemi, L inattention, HOH Restrictions Weight Bearing Restrictions: No    Therapy/Group: Individual Therapy  [name] 08/24/2021, 2:59 PM

## 2021-08-24 NOTE — Progress Notes (Signed)
Patient ID: Barry [name], male   DOB: 1933/05/08, 87 y.o.   MRN: [id number]  Spoke with [facility] who will have a bed for pt Wednesday. Have informed pt and called son to inform of the plan. [facility] will order COVID test tomorrow and work on transfer for Wednesday. Both pt and son agreeable to this plan and this is his first choice for facility.

## 2021-08-24 NOTE — Progress Notes (Addendum)
Inpatient Rehabilitation Discharge Medication Review by a Pharmacist  A complete drug regimen review was completed for this patient to identify any potential clinically significant medication issues.  High Risk Drug Classes Is patient taking? Indication by Medication  Antipsychotic No   Anticoagulant No   Antibiotic No   Opioid No   Antiplatelet Yes Plavix for CVA  Hypoglycemics/insulin Yes Amaryl for DM  Vasoactive Medication Yes Metoprolol, hydralazine, amlodipine for BP   Chemotherapy No   Other Yes Fluoxetine, clonazepam for mood/anxiety     Type of Medication Issue Identified Description of Issue Recommendation(s)  Drug Interaction(s) (clinically significant)     Duplicate Therapy     Allergy     No Medication Administration End Date     Incorrect Dose     Additional Drug Therapy Needed     Significant med changes from prior encounter (inform family/care partners about these prior to discharge).    Other       Clinically significant medication issues were identified that warrant physician communication and completion of prescribed/recommended actions by midnight of the next day:  No   Pharmacist comments: Pending SNF dc  Time spent performing this drug regimen review (minutes):  20 minutes   Tad Moore 08/24/2021 10:43 AM

## 2021-08-24 NOTE — Progress Notes (Deleted)
Speech Language Pathology Discharge Summary  Patient Details  Name: Barry [name] MRN: [id number] Date of Birth: 05-19-1933  Today's Date: 08/24/2021 SLP Individual Time: 0830-0915 SLP Individual Time Calculation (min): 45 min   Skilled Therapeutic Interventions:  Pt seen for skilled ST with focus on cognitive communication goals and discharge planning. Pt expressing mild disappointment with d/c to SNF vs home with son, however demonstrates increased emergent and anticipatory awareness of current cognitive and physical deficits and not at functional baseline. Pt endorses memory deficits, requires education on ongoing deficits in safety and problem solving. Pt is quite fearful of falling, states son will be getting Life Alert button for him. SLP facilitating activity focused on potential hazard recognition strategies and fall risk prevention by providing min fading to SPV cues for accuracy. Encouraged pt to continue cognitively stimulating exercises in SNF environment including reading the newspaper and watching the news. Recommend 24/7 supervision at discharge due to cognitive impairments and ongoing ST.   Patient has met 4 of 4 long term goals.  Patient to discharge at [facility] level.   Clinical Impression/Discharge Summary:   Pt has made slow but consistent progress during CIR and has met 4 out of 4 long term goals. Initially anticipated pt to discharge at supervision level A for cognition, however due to slow progress and decreased insight/awareness, goals were downgraded to Min A which is the level pt is currently performing. Pt is discharging to SNF environment for continued therapeutic intervention due to remaining cognitive and physical impairments. Pt is benefiting from compensatory strategies for memory impairments including writing important information down and association. Pt provided education regarding recommendations for ongoing ST services to maximize cognitive function and  reduce caregiver burden, pt verbalized understanding and states he is thankful for ST intervention during CIR. Recommend 24/7 supervision/assist at discharge due to cognitive impairments.   Care Partner:  Caregiver Able to Provide Assistance: No  Will be discharging to SNF for further rehabilitation.  Recommendation:  Skilled Nursing facility;24 hour supervision/assistance  Rationale for SLP Follow Up: Maximize cognitive function and independence;Reduce caregiver burden   Reasons for discharge: Discharged from hospital   Patient/Family Agrees with Progress Made and Goals Achieved: Yes    Barry [name] 08/24/2021, 9:05 AM

## 2021-08-24 NOTE — Plan of Care (Signed)
  Problem: RH Problem Solving Goal: LTG Patient will demonstrate problem solving for (SLP) Description: LTG:  Patient will demonstrate problem solving for basic/complex daily situations with cues  (SLP) Outcome: Completed/Met Flowsheets (Taken 08/24/2021 0859) LTG: Patient will demonstrate problem solving for (SLP): Complex daily situations LTG Patient will demonstrate problem solving for: Minimal Assistance - Patient > 75%   Problem: RH Memory Goal: LTG Patient will demonstrate ability for day to day (SLP) Description: LTG:   Patient will demonstrate ability for day to day recall/carryover during cognitive/linguistic activities with assist  (SLP) Outcome: Completed/Met Flowsheets (Taken 08/24/2021 0859) LTG: Patient will demonstrate ability for day to day recall: New information LTG: Patient will demonstrate ability for day to day recall/carryover during cognitive/linguistic activities with assist (SLP): Minimal Assistance - Patient > 75% Goal: LTG Patient will use memory compensatory aids to (SLP) Description: LTG:  Patient will use memory compensatory aids to recall biographical/new, daily complex information with cues (SLP) Outcome: Completed/Met Flowsheets (Taken 08/24/2021 0859) LTG: Patient will use memory compensatory aids to (SLP): Minimal Assistance - Patient > 75%   Problem: RH Awareness Goal: LTG: Patient will demonstrate awareness during functional activites type of (SLP) Description: LTG: Patient will demonstrate awareness during functional activites type of (SLP) Outcome: Completed/Met Flowsheets (Taken 08/24/2021 0859) Patient will demonstrate during cognitive/linguistic activities awareness type of: Emergent LTG: Patient will demonstrate awareness during cognitive/linguistic activities with assistance of (SLP): Minimal Assistance - Patient > 75%

## 2021-08-25 LAB — GLUCOSE, CAPILLARY
Glucose-Capillary: 144 mg/dL — ABNORMAL HIGH (ref 70–99)
Glucose-Capillary: 165 mg/dL — ABNORMAL HIGH (ref 70–99)
Glucose-Capillary: 138 mg/dL — ABNORMAL HIGH (ref 70–99)
Glucose-Capillary: 108 mg/dL — ABNORMAL HIGH (ref 70–99)

## 2021-08-25 MED ORDER — ROPINIROLE HCL 0.25 MG PO TABS
0.2500 mg | ORAL_TABLET | Freq: Every day | ORAL | Status: DC
  Administered 2021-08-25: 0.25 mg via ORAL
  Filled 2021-08-25: qty 1

## 2021-08-25 MED ORDER — ROPINIROLE HCL 0.25 MG PO TABS: 0.2500 mg | ORAL_TABLET | Freq: Every day | ORAL | Status: DC

## 2021-08-25 NOTE — Progress Notes (Signed)
[facility] PHYSICAL MEDICINE & REHABILITATION PROGRESS NOTE  Subjective/Complaints: Concerned about his weakness and asks again if his lower extremity strength will every improve. Discussed that it should continue to improve and he should continue to walk and exercise as much as he can tolerated  ROS:  Pt denies SOB, abd pain, CP, N/V/C/D, and vision changes, +left lower extremity weakness  Objective: Vital Signs: Blood pressure (!) 157/67, pulse 70, temperature 98.6 F (37 C), temperature source Oral, resp. rate 16, height 5\' 11"  (1.803 m), weight 74.2 kg, SpO2 97 %. No results found. No results for input(s): WBC, HGB, HCT, PLT in the last 72 hours.  No results for input(s): NA, K, CL, CO2, GLUCOSE, BUN, CREATININE, CALCIUM in the last 72 hours.    Intake/Output Summary (Last 24 hours) at 08/25/2021 0947 Last data filed at 08/25/2021 0745 Gross per 24 hour  Intake 535 ml  Output --  Net 535 ml        Physical Exam: BP (!) 157/67 (BP Location: Left Arm)   Pulse 70   Temp 98.6 F (37 C) (Oral)   Resp 16   Ht 5\' 11"  (1.803 m)   Wt 74.2 kg   SpO2 97%   BMI 22.81 kg/m   Gen: no distress, normal appearing HEENT: oral mucosa pink and moist, NCAT Cardio: Reg rate Chest: normal effort, normal rate of breathing Abd: soft, non-distended Ext: no edema Psych: pleasant, normal affect Skin: intact Neuro: Makes eye contact with examiner.   Follows commands.   Name and age.   He does have some mild delay in processing. Difficulty with medication management.  Dysarthria, unchanged Motor: RUE/RLE: 4/5 proximal distal LUE: 4-/5 proximal distal LLE: 3/5 throughout Dysmetria LUE and LLE Using Stedy to get to bathroom  Assessment/Plan: 1. Functional deficits which require 3+ hours per day of interdisciplinary therapy in a comprehensive inpatient rehab setting. Physiatrist is providing close team supervision and 24 hour management of active medical problems listed  below. Physiatrist and rehab team continue to assess barriers to discharge/monitor patient progress toward functional and medical goals   Care Tool:  Bathing    Body parts bathed by patient: Right arm, Left arm, Chest, Abdomen, Front perineal area, Right upper leg, Left upper leg, Face   Body parts bathed by helper: Buttocks, Right lower leg, Left lower leg     Bathing assist Assist Level: Moderate Assistance - Patient 50 - 74%     Upper Body Dressing/Undressing Upper body dressing   What is the patient wearing?: Pull over shirt    Upper body assist Assist Level: Minimal Assistance - Patient > 75%    Lower Body Dressing/Undressing Lower body dressing      What is the patient wearing?: Pants     Lower body assist Assist for lower body dressing: Maximal Assistance - Patient 25 - 49%     Toileting Toileting    Toileting assist Assist for toileting: Moderate Assistance - Patient 50 - 74%     Transfers Chair/bed transfer  Transfers assist     Chair/bed transfer assist level: Moderate Assistance - Patient 50 - 74%     Locomotion Ambulation   Ambulation assist   Ambulation activity did not occur: Safety/medical concerns  Assist level: 2 helpers (mod A & +2 w/c follow) Assistive device: Walker-rolling Max distance: 159ft   Walk 10 feet activity   Assist  Walk 10 feet activity did not occur: Safety/medical concerns  Assist level: 2 helpers Assistive device: [employer]  Walk 50 feet activity   Assist Walk 50 feet with 2 turns activity did not occur: Safety/medical concerns  Assist level: 2 helpers Assistive device: Walker-Eva    Walk 150 feet activity   Assist Walk 150 feet activity did not occur: Safety/medical concerns  Assist level: 2 helpers Assistive device: Walker-Eva    Walk 10 feet on uneven surface  activity   Assist Walk 10 feet on uneven surfaces activity did not occur: Safety/medical concerns          Wheelchair     Assist Is the patient using a wheelchair?: Yes Type of Wheelchair: Manual    Wheelchair assist level: Supervision/Verbal cueing Max wheelchair distance: 51ft    Wheelchair 50 feet with 2 turns activity    Assist        Assist Level: Dependent - Patient 0%   Wheelchair 150 feet activity     Assist      Assist Level: Dependent - Patient 0%    Medical Problem List and Plan: 1.  Left-sided hemiparesis secondary to infarct right posterior lentiform nucleus, thalamus, and tail of the caudate with additional punctate infarct in the lateral left thalamus.   Continue CIR SNF pnd  2.  Impaired mobility: -DVT/anticoagulation:  Pharmaceutical: Change heparin to Lovenox             -antiplatelet therapy: Continue Aspirin 81 mg daily and Plavix 75 mg daily until 10/30, then Plavix alone. 3. Low back pain: Improved. Continue kpad. Discontinue Lyrica, discontinue hydrocodone             Monitor with increased exertion 4. Anxiety: Decrease daytime Klonopin 0.125 mg daily, Prozac 20 mg daily, continue klonopin 0.25mg  HS.              -antipsychotic agents: N/A 5. Neuropsych: This patient is capable of making decisions on his own behalf. 6. Skin/Wound Care: Routine skin checks 7. Fluids/Electrolytes/Nutrition: Routine in and outs 8.  Hypertension.   Continue Norvasc to 10mg .   Increase hydralazine to 100 mg TID- monitor effect  Add back HS klonopin which will help with anxiety and BP       Vitals:   08/24/21 1951 08/25/21 0540  BP: (!) 153/71 (!) 157/67  Pulse: 65 70  Resp: 14 16  Temp: 98 F (36.7 C) 98.6 F (37 C)  SpO2: 97% 97%   Labile cont to monitor with med changes 9.  Hyperlipidemia.  Continue Zocor/Zetia 10.  COPD with history of tobacco use.  Continue nebulizers as directed as well as Singulair.  Check oxygen saturations every shift 11.  Diabetes mellitus with hyperglycemia.  Hemoglobin A1c 6.7.  Continue Amaryl 0.5 mg twice  daily  Elevated on 10/16, will consider medication adjustments if persistent  Provide dietary education- recommended avoiding added sugar in drinks and meals   CBG (last 3)  Recent Labs    08/24/21 1711 08/24/21 2104 08/25/21 0617  GLUCAP 94 178* 144*    12.  Right side parotid mass.  Follow-up outpatient ENT 13.  CKD stage III.  Baseline creatinine 1.1-1.3.               Cr up to 1.41- place nursing order to encourage 6-8 glasses of water per day and creatinine has normalized to 1.13, monitor at SNF as needed.  14.  GERD.  Continue Protonix 15. Right breat toe bunion: requested ortho tech eval for orthotic. Discussed benefits of icing.  15. Bradycardia: decrease lopressor to 12.5mg - controlled HR Vitals:  08/24/21 1951 08/25/21 0540  BP: (!) 153/71 (!) 157/67  Pulse: 65 70  Resp: 14 16  Temp: 98 F (36.7 C) 98.6 F (37 C)  SpO2: 97% 97%   16. Right CMC arthritis: discussed bracing and he defers at this time. Added voltaren gel QID 17. Disturbed sleep  10/29- will try Traozdone for sleep- to help him sleep through- 50 mg QHS for now 18. Left foot swelling: placed nursing order to request elevated and ice 15 minutes three times per day, avoid pressure garments as he feels this worsened swelling. Vascular Ultrasound reviewed with patient and is negative. Lasix 10mg  on 11/2 19. Restless leg syndrome: started Requip 0.25mg  HS.     LOS: 25 days A FACE TO FACE EVALUATION WAS PERFORMED  Barry [name] 08/25/2021, 9:47 AM

## 2021-08-25 NOTE — Progress Notes (Signed)
Occupational Therapy Session Note  Patient Details  Name: Barry [name] MRN: [id number] Date of Birth: 06/10/1933  Today's Date: 08/25/2021 OT Individual Time: 1130-1200 OT Individual Time Calculation (min): 30 min    Short Term Goals: Week 1:  OT Short Term Goal 1 (Week 1): Pt will maintain static sitting balance EOB with no more than intermittent min A in prep for seated ADL. OT Short Term Goal 1 - Progress (Week 1): Met OT Short Term Goal 2 (Week 1): Pt will completed sit to stand with max of 1 in prep for standing ADL. OT Short Term Goal 2 - Progress (Week 1): Met OT Short Term Goal 3 (Week 1): Pt will don shirt with min A. OT Short Term Goal 3 - Progress (Week 1): Met OT Short Term Goal 4 (Week 1): Pt will don pants with max A. OT Short Term Goal 4 - Progress (Week 1): Met OT Short Term Goal 5 (Week 1): Pt will complete toilet transfer with max and LRAD. OT Short Term Goal 5 - Progress (Week 1): Met Week 2:  OT Short Term Goal 1 (Week 2): Patient will complete sit to stand transfers with Mod A and LRAD. OT Short Term Goal 2 (Week 2): Patient with complete toilet transfers with Mod A and LRAD. OT Short Term Goal 2 - Progress (Week 2): Met OT Short Term Goal 3 (Week 2): Patient will complete LB dressing with Mod A in sitting/standing with hemi technique and LRAD. OT Short Term Goal 3 - Progress (Week 2): Met OT Short Term Goal 4 (Week 2): Patient will complete 2/3 parts of toileting task with Mod A in sitting/standing with good safety awareness and LRAD. OT Short Term Goal 4 - Progress (Week 2): Met Week 3:  OT Short Term Goal 1 (Week 3): STGs=LTGs due to ELOS  Skilled Therapeutic Interventions/Progress Updates:    Patient seen this A.M. in room seated in his w/c.with safety alarm in place.  The pt was able to  roll to the sink area and wash his face and comb his hair with s/u, he was able to brush his teeth with s/u assist also.  The pt completed UB therex while seated in his  w/c, incorporating a towel for controlled movement for shld flexion, horizontal abduction, shld rotation, and large circle to improve upon upper body strength and endurance for increase functional independence with BADL task performance.  The pt  remained in his w/c with his safety alarm in place, the bedside table within reach, as well as his call light available. The pt had no c/o pain at the time of treatment and he indicated that he was comfortable and was pleased with the direction of his OT treatment session.   Therapy Documentation Precautions:  Precautions Precautions: Fall Precaution Comments: L hemi, L inattention, HOH Restrictions Weight Bearing Restrictions: No General:   Vital Signs:  Pain:   A Vision   Perception    Praxis   Exercises:   Other Treatments:     Therapy/Group: Individual Therapy  Yvonne Kendall 08/25/2021, 12:24 PM

## 2021-08-25 NOTE — Progress Notes (Signed)
Speech Language Pathology Daily Session Note  Patient Details  Name: Barry [name] MRN: [id number] Date of Birth: 27-Dec-1932  Today's Date: 08/25/2021 SLP Individual Time: 1510-1550 SLP Individual Time Calculation (min): 40 min  Short Term Goals: Week 4: SLP Short Term Goal 1 (Week 4): STG=LTG due to ELOS  Skilled Therapeutic Interventions: Pt seen for skilled ST with focus on cognitive goals and discharge planning, pt lying in bed in the dark and reports "feeling down in the dumps". SLP providing appropriate emotional support and redirection. Pt has been placed at SNF and is planning to discharge tomorrow. Ongoing discussions on pts current cognitive status yield an increased awareness of current deficits. Pt states "I know I'm forgetful" which patient previously denied. SLP facilitating safety task with emphasis on fall prevention and potential hazard recognition by providing min A cues. Pt will no questions at this time, thankful for ST intervention. Pt left in bed with alarm set and all needs within reach.   Pain Pain Assessment Pain Scale: 0-10 Pain Score: 0-No pain  Therapy/Group: Individual Therapy  [name] 08/25/2021, 3:36 PM

## 2021-08-25 NOTE — Progress Notes (Signed)
Inpatient Rehabilitation Care Coordinator Discharge Note   Patient Details  Name: Barry [name] MRN: [id number] Date of Birth: [birth year]   Discharge location: [facility] [place]  Length of Stay: 26 DAYS  Discharge activity level: MIN-MOD LEVEL  Home/community participation: ACTIVE  Patient response WU:[id number] Literacy - How often do you need to have someone help you when you read instructions, pamphlets, or other written material from your doctor or pharmacy?: Never  Patient response YN:WGNFAO Isolation - How often do you feel lonely or isolated from those around you?: Never  Services provided included: MD, RD, PT, OT, SLP, RN, CM, TR, Pharmacy, SW  Financial Services:  Financial Services Utilized: Medicare    Choices offered to/list presented to: PT AND SON  Follow-up services arranged:  Other (Comment) (SNF)           Patient response to transportation need: Is the patient able to respond to transportation needs?: Yes In the past 12 months, has lack of transportation kept you from medical appointments or from getting medications?: No In the past 12 months, has lack of transportation kept you from meetings, work, or from getting things needed for daily living?: No    Comments (or additional information): [place]. PT HOPEFUL ONCE REHAB COMPLETE HE CAN GO HOME  Patient/Family verbalized understanding of follow-up arrangements:  Yes  Individual responsible for coordination of the follow-up plan: DARRYL-SON  336  Confirmed correct DME delivered: Elease Hashimoto 08/25/2021    Elease Hashimoto

## 2021-08-25 NOTE — Progress Notes (Signed)
Physical Therapy Session Note  Patient Details  Name: Barry [name] MRN: [id number] Date of Birth: 01/20/1933  Today's Date: 08/25/2021 PT Individual Time: 3419-6222 PT Individual Time Calculation (min): 42 min   and  Today's Date: 08/25/2021 PT Missed Time: 18 Minutes Missed Time Reason: Patient fatigue  Short Term Goals: Week 3:  PT Short Term Goal 1 (Week 3): STG = LTG due to ELOS  Skilled Therapeutic Interventions/Progress Updates:    Pt received supine in bed reporting having a "rough night" last night, stating he didn't sleep well and has felt "off" the past few days. Despite this, pt agreeable to therapy session. Supine>sitting L EOB, HOB flat but using bedrail, with supervision. Sitting EOB donned tennis shoes total assist for time management. L squat pivot EOB>w/c with heavy min assist for lifting/pivoting hips - cuing for head/hips relationship.  Transported to/from gym in w/c for time management and energy conservation. Sit>stand from w/c or EOM to RW with light min assist - continues to have delayed trunk/hip/knee extension while rising to stand. Gait training 167ft +185ft using RW min progressed to consistent mod assist for balance due to L lean - continues to have excessive L LE adduction/scissoring during swing phase although improving with pt able to recall himself the need to step towards L side of walker - also continues to have partial crouched gait posture with excessive hip/knee flexion - repeated cuing throughout to correct these gait deviations - also demos reciprocal stepping pattern with too long of step lengths requiring cuing for improvement to maintain balance. When turning to sit on mat at end of 2nd walk pt starts to squat down before turning fully requiring heavy min assist to rotate hips around to land on mat safely. Therapist discussed trialing GRAFO to address lack of terminal L knee extension during stance phase; however, pt suddenly reports feeling  "lightheaded." Instructed pt sit>supine with supervision. Assessed vitals as follows:   Supine: BP 181/72 (MAP 105), HR 71bpm, SpO2 100%   Sitting: BP 175/91 (MAP 114), HR 73bpm, SpO2 100%   Pt requesting to end therapy session early due to not feeling well and continuing to report he has felt "weak" and "off" the past few days, but unable to elaborate further. L squat pivot EOM>w/c heavy min assist for lifting/pivoting hips. Transported back to room. RN notified of pt's symptoms and vitals. Pt requesting to remain in w/c - left with needs in reach and seat belt alarm on. Missed 18 minutes of skilled physical therapy.   Therapy Documentation Precautions:  Precautions Precautions: Fall Precaution Comments: L hemi, L inattention, HOH Restrictions Weight Bearing Restrictions: No   Pain:  No reports of pain throughout session.   Therapy/Group: Individual Therapy  Tawana Scale , PT, DPT, NCS, CSRS  08/25/2021, 3:26 PM

## 2021-08-25 NOTE — Progress Notes (Signed)
Occupational Therapy Discharge Summary  Patient Details  Name: Barry [name] MRN: [id number] Date of Birth: 1933-07-31  Today's Date: 08/25/2021 OT Individual Time: 1130-1200 OT Individual Time Calculation (min): 30 min    Patient has met 12 of 12 long term goals due to improved activity tolerance, improved balance, postural control, ability to compensate for deficits, functional use of  RIGHT lower and LEFT lower extremity, improved attention, improved awareness, and improved coordination.  Patient to discharge at [facility] Assist level.  Patient's care partner is independent to provide the necessary physical and cognitive assistance at discharge.    Reasons goals not met: NA  Recommendation:  Patient will benefit from ongoing skilled OT services in skilled nursing facility setting to continue to advance functional skills in the area of BADL and iADL.  Equipment: No equipment provided  Reasons for discharge: discharge from hospital  Patient/family agrees with progress made and goals achieved: Yes  OT Discharge Precautions/Restrictions  Precautions Precautions: Fall Precaution Comments: L hemi, L inattention, HOH Restrictions Weight Bearing Restrictions: No Pain Pain Assessment Pain Scale: 0-10 Pain Score: 0-No pain ADL ADL Eating: Modified independent Where Assessed-Eating: Wheelchair Grooming: Modified independent Where Assessed-Grooming: Sitting at sink Upper Body Bathing: Modified independent Where Assessed-Upper Body Bathing: Shower Lower Body Bathing: Contact guard Where Assessed-Lower Body Bathing: Shower Upper Body Dressing: Supervision/safety Where Assessed-Upper Body Dressing: Wheelchair Lower Body Dressing: Minimal assistance Where Assessed-Lower Body Dressing: Wheelchair Toileting: Contact guard Where Assessed-Toileting: Glass blower/designer: Psychiatric nurse Method: Arts development officer: Consulting civil engineer: Not assessed Social research officer, government: Not assessed Vision Baseline Vision/History: 1 Wears glasses Patient Visual Report: No change from baseline Vision Assessment?: No apparent visual deficits Perception  Perception: Impaired Inattention/Neglect: Does not attend to left side of body Praxis Praxis: Impaired Praxis Impairment Details: Motor planning Cognition Overall Cognitive Status: Impaired/Different from baseline Arousal/Alertness: Awake/alert Orientation Level: Oriented X4 Year: 2022 Month: November Day of Week: Correct Attention: Focused;Sustained;Selective Focused Attention: Appears intact Sustained Attention: Appears intact Selective Attention: Appears intact Memory: Impaired Memory Impairment: Decreased recall of new information;Decreased short term memory Decreased Short Term Memory: Verbal basic;Functional basic Immediate Memory Recall: Sock;Blue;Bed Memory Recall Sock: Not able to recall Memory Recall Blue: Not able to recall Memory Recall Bed: Not able to recall Awareness: Impaired Awareness Impairment: Emergent impairment;Anticipatory impairment Problem Solving: Impaired Problem Solving Impairment: Verbal basic;Functional basic Behaviors: Poor frustration tolerance Safety/Judgment: Impaired Sensation Sensation Light Touch: Impaired by gross assessment Hot/Cold: Appears Intact Proprioception: Impaired by gross assessment Stereognosis: Appears Intact Additional Comments: Diminished proximally LUE Coordination Gross Motor Movements are Fluid and Coordinated: No Fine Motor Movements are Fluid and Coordinated: No Finger Nose Finger Test: Slow and decreased precision LUE Motor  Motor Motor: Motor apraxia;Hemiplegia;Abnormal postural alignment and control Motor - Discharge Observations: Improved since date of evaluation but continues to exhibit L lean in sitting and standing, poor postural control/awareness, L inattention, and L hemi. Mobility   Transfers Sit to Stand: Contact Guard/Touching assist Stand to Sit: Contact Guard/Touching assist  Trunk/Postural Assessment  Cervical Assessment Cervical Assessment: Exceptions to [facility] (forward head) Thoracic Assessment Thoracic Assessment: Exceptions to [facility] (rounded shoulders and flexible kyphosis) Lumbar Assessment Lumbar Assessment: Exceptions to [facility] (posterior pelvic tilt; improved since IE) Postural Control Postural Control: Deficits on evaluation Righting Reactions: delayed and inadequate Protective Responses: delayed and inadequate  Balance Static Sitting Balance Static Sitting - Balance Support: Feet supported;No upper extremity supported Static Sitting - Level of Assistance: 5: Stand by assistance Dynamic  Sitting Balance Dynamic Sitting - Balance Support: Feet unsupported;No upper extremity supported Dynamic Sitting - Level of Assistance: 5: Stand by assistance Static Standing Balance Static Standing - Balance Support: Bilateral upper extremity supported Static Standing - Level of Assistance: 4: Min assist Dynamic Standing Balance Dynamic Standing - Balance Support: During functional activity Dynamic Standing - Level of Assistance: 4: Min assist Dynamic Standing - Balance Activities: [employer];Reaching for objects;Forward lean/weight shifting Extremity/Trunk Assessment RUE Assessment RUE Assessment: Within Functional Limits LUE Assessment LUE Assessment: Exceptions to [facility] LUE Body System: Neuro Brunstrum level for arm: Stage V Relative Independence from Synergy Brunstrum level for hand: Stage VI Isolated joint movements   Monya Kozakiewicz L Hosie Sharman 08/25/2021, 3:35 PM

## 2021-08-26 DIAGNOSIS — G8194 Hemiplegia, unspecified affecting left nondominant side: Secondary | ICD-10-CM | POA: Diagnosis not present

## 2021-08-26 DIAGNOSIS — Z743 Need for continuous supervision: Secondary | ICD-10-CM | POA: Diagnosis not present

## 2021-08-26 DIAGNOSIS — E785 Hyperlipidemia, unspecified: Secondary | ICD-10-CM | POA: Diagnosis not present

## 2021-08-26 DIAGNOSIS — R531 Weakness: Secondary | ICD-10-CM | POA: Diagnosis not present

## 2021-08-26 DIAGNOSIS — H04123 Dry eye syndrome of bilateral lacrimal glands: Secondary | ICD-10-CM | POA: Diagnosis not present

## 2021-08-26 DIAGNOSIS — E46 Unspecified protein-calorie malnutrition: Secondary | ICD-10-CM | POA: Diagnosis not present

## 2021-08-26 DIAGNOSIS — K219 Gastro-esophageal reflux disease without esophagitis: Secondary | ICD-10-CM | POA: Diagnosis not present

## 2021-08-26 DIAGNOSIS — F32A Depression, unspecified: Secondary | ICD-10-CM | POA: Diagnosis not present

## 2021-08-26 DIAGNOSIS — N183 Chronic kidney disease, stage 3 unspecified: Secondary | ICD-10-CM | POA: Diagnosis not present

## 2021-08-26 DIAGNOSIS — C07 Malignant neoplasm of parotid gland: Secondary | ICD-10-CM | POA: Diagnosis not present

## 2021-08-26 DIAGNOSIS — J309 Allergic rhinitis, unspecified: Secondary | ICD-10-CM | POA: Diagnosis not present

## 2021-08-26 DIAGNOSIS — F17200 Nicotine dependence, unspecified, uncomplicated: Secondary | ICD-10-CM | POA: Diagnosis not present

## 2021-08-26 DIAGNOSIS — I2510 Atherosclerotic heart disease of native coronary artery without angina pectoris: Secondary | ICD-10-CM | POA: Diagnosis not present

## 2021-08-26 DIAGNOSIS — I639 Cerebral infarction, unspecified: Secondary | ICD-10-CM | POA: Diagnosis not present

## 2021-08-26 DIAGNOSIS — I69354 Hemiplegia and hemiparesis following cerebral infarction affecting left non-dominant side: Secondary | ICD-10-CM | POA: Diagnosis not present

## 2021-08-26 DIAGNOSIS — Z23 Encounter for immunization: Secondary | ICD-10-CM | POA: Diagnosis not present

## 2021-08-26 DIAGNOSIS — R482 Apraxia: Secondary | ICD-10-CM | POA: Diagnosis not present

## 2021-08-26 DIAGNOSIS — J449 Chronic obstructive pulmonary disease, unspecified: Secondary | ICD-10-CM | POA: Diagnosis not present

## 2021-08-26 DIAGNOSIS — E1151 Type 2 diabetes mellitus with diabetic peripheral angiopathy without gangrene: Secondary | ICD-10-CM | POA: Diagnosis not present

## 2021-08-26 DIAGNOSIS — F419 Anxiety disorder, unspecified: Secondary | ICD-10-CM | POA: Diagnosis not present

## 2021-08-26 DIAGNOSIS — I6381 Other cerebral infarction due to occlusion or stenosis of small artery: Secondary | ICD-10-CM | POA: Diagnosis not present

## 2021-08-26 DIAGNOSIS — I6389 Other cerebral infarction: Secondary | ICD-10-CM | POA: Diagnosis not present

## 2021-08-26 DIAGNOSIS — I10 Essential (primary) hypertension: Secondary | ICD-10-CM | POA: Diagnosis not present

## 2021-08-26 DIAGNOSIS — E1165 Type 2 diabetes mellitus with hyperglycemia: Secondary | ICD-10-CM | POA: Diagnosis not present

## 2021-08-26 LAB — SARS CORONAVIRUS 2 (TAT 6-24 HRS): SARS Coronavirus 2: NEGATIVE

## 2021-08-26 LAB — GLUCOSE, CAPILLARY: Glucose-Capillary: 134 mg/dL — ABNORMAL HIGH (ref 70–99)

## 2021-08-26 NOTE — Progress Notes (Signed)
Report given to nurse ashley at [facility]

## 2021-08-26 NOTE — Progress Notes (Signed)
[place] PHYSICAL MEDICINE & REHABILITATION PROGRESS NOTE  Subjective/Complaints: Requip helped with his restless leg syndrome  Ready for SNF today Discussed follow-up in clinic in about a month BP better controlled  ROS:  Pt denies SOB, abd pain, CP, N/V/C/D, and vision changes, +left lower extremity weakness, restless leg syndrome improved.   Objective: Vital Signs: Blood pressure (!) 147/67, pulse 70, temperature 98.2 F (36.8 C), temperature source Oral, resp. rate 18, height 5\' 11"  (1.803 m), weight 74.2 kg, SpO2 100 %. No results found. No results for input(s): WBC, HGB, HCT, PLT in the last 72 hours.  No results for input(s): NA, K, CL, CO2, GLUCOSE, BUN, CREATININE, CALCIUM in the last 72 hours.    Intake/Output Summary (Last 24 hours) at 08/26/2021 0923 Last data filed at 08/26/2021 0658 Gross per 24 hour  Intake 454 ml  Output 1750 ml  Net -1296 ml        Physical Exam: BP (!) 147/67 (BP Location: Right Arm)   Pulse 70   Temp 98.2 F (36.8 C) (Oral)   Resp 18   Ht 5\' 11"  (1.803 m)   Wt 74.2 kg   SpO2 100%   BMI 22.81 kg/m   Gen: no distress, normal appearing HEENT: oral mucosa pink and moist, NCAT Cardio: Reg rate Chest: normal effort, normal rate of breathing Abd: soft, non-distended Ext: no edema Psych: pleasant, normal affect Neuro: Makes eye contact with examiner.   Follows commands.   Name and age.   He does have some mild delay in processing. Difficulty with medication management.  Dysarthria, unchanged Motor: RUE/RLE: 4/5 proximal distal LUE: 4-/5 proximal distal LLE: 3/5 throughout Dysmetria LUE and LLE Using Stedy to get to bathroom  Assessment/Plan: 1. Functional deficits which require 3+ hours per day of interdisciplinary therapy in a comprehensive inpatient rehab setting. Physiatrist is providing close team supervision and 24 hour management of active medical problems listed below. Physiatrist and rehab team continue to assess  barriers to discharge/monitor patient progress toward functional and medical goals   Care Tool:  Bathing    Body parts bathed by patient: Right arm, Left arm, Chest, Abdomen, Front perineal area, Right upper leg, Left upper leg, Face   Body parts bathed by helper: Buttocks, Right lower leg, Left lower leg     Bathing assist Assist Level: Moderate Assistance - Patient 50 - 74%     Upper Body Dressing/Undressing Upper body dressing   What is the patient wearing?: Pull over shirt    Upper body assist Assist Level: Minimal Assistance - Patient > 75%    Lower Body Dressing/Undressing Lower body dressing      What is the patient wearing?: Pants     Lower body assist Assist for lower body dressing: Maximal Assistance - Patient 25 - 49%     Toileting Toileting    Toileting assist Assist for toileting: Moderate Assistance - Patient 50 - 74%     Transfers Chair/bed transfer  Transfers assist     Chair/bed transfer assist level: Minimal Assistance - Patient > 75%     Locomotion Ambulation   Ambulation assist   Ambulation activity did not occur: Safety/medical concerns  Assist level: Moderate Assistance - Patient 50 - 74% Assistive device: Walker-rolling Max distance: 126ft   Walk 10 feet activity   Assist  Walk 10 feet activity did not occur: Safety/medical concerns  Assist level: Minimal Assistance - Patient > 75% Assistive device: Walker-rolling   Walk 50 feet activity  Assist Walk 50 feet with 2 turns activity did not occur: Safety/medical concerns  Assist level: Moderate Assistance - Patient - 50 - 74% Assistive device: Walker-rolling    Walk 150 feet activity   Assist Walk 150 feet activity did not occur: Safety/medical concerns  Assist level: Moderate Assistance - Patient - 50 - 74% Assistive device: Walker-rolling    Walk 10 feet on uneven surface  activity   Assist Walk 10 feet on uneven surfaces activity did not occur:  Safety/medical concerns         Wheelchair     Assist Is the patient using a wheelchair?: Yes Type of Wheelchair: Manual    Wheelchair assist level: Supervision/Verbal cueing Max wheelchair distance: 86ft    Wheelchair 50 feet with 2 turns activity    Assist        Assist Level: Dependent - Patient 0%   Wheelchair 150 feet activity     Assist      Assist Level: Dependent - Patient 0%    Medical Problem List and Plan: 1.  Left-sided hemiparesis secondary to infarct right posterior lentiform nucleus, thalamus, and tail of the caudate with additional punctate infarct in the lateral left thalamus.   Continue CIR SNF pnd  2.  Impaired mobility: -DVT/anticoagulation:  Pharmaceutical: Change heparin to Lovenox             -antiplatelet therapy: Continue Aspirin 81 mg daily and Plavix 75 mg daily until 10/30, then Plavix alone. 3. Low back pain: Improved. Continue kpad. Discontinue Lyrica, discontinue hydrocodone             Monitor with increased exertion 4. Anxiety: d/c daytime Klonopin 0.125 mg daily, Prozac 20 mg daily, continue klonopin 0.25mg  HS.              -antipsychotic agents: N/A 5. Neuropsych: This patient is capable of making decisions on his own behalf. 6. Skin/Wound Care: Routine skin checks 7. Fluids/Electrolytes/Nutrition: Routine in and outs 8.  Hypertension.   Continue Norvasc to 10mg .   Increase hydralazine to 100 mg TID- monitor effect  Add back HS klonopin which will help with anxiety and BP       Vitals:   08/25/21 1930 08/26/21 0522  BP: (!) 165/69 (!) 147/67  Pulse: 66 70  Resp: 17 18  Temp: 98.6 F (37 C) 98.2 F (36.8 C)  SpO2: 98% 100%   Labile cont to monitor with med changes 9.  Hyperlipidemia.  Continue Zocor/Zetia 10.  COPD with history of tobacco use.  Continue nebulizers as directed as well as Singulair.  Check oxygen saturations every shift 11.  Diabetes mellitus with hyperglycemia.  Hemoglobin A1c 6.7.  Continue  Amaryl 0.5 mg twice daily  Elevated on 10/16, will consider medication adjustments if persistent  Provide dietary education- recommended avoiding added sugar in drinks and meals   CBG (last 3)  Recent Labs    08/25/21 1655 08/25/21 2206 08/26/21 0634  GLUCAP 138* 108* 134*    12.  Right side parotid mass.  Follow-up outpatient ENT 13.  CKD stage III.  Baseline creatinine 1.1-1.3.               Cr up to 1.41- place nursing order to encourage 6-8 glasses of water per day and creatinine has normalized to 1.13, monitor at SNF as needed.  14.  GERD.  Continue Protonix 15. Right breat toe bunion: requested ortho tech eval for orthotic. Discussed benefits of icing.  15. Bradycardia: decrease lopressor to  12.5mg - controlled HR Vitals:   08/25/21 1930 08/26/21 0522  BP: (!) 165/69 (!) 147/67  Pulse: 66 70  Resp: 17 18  Temp: 98.6 F (37 C) 98.2 F (36.8 C)  SpO2: 98% 100%   16. Right CMC arthritis: discussed bracing and he defers at this time. Added voltaren gel QID 17. Disturbed sleep  10/29- will try Traozdone for sleep- to help him sleep through- 50 mg QHS for now 18. Left foot swelling: placed nursing order to request elevated and ice 15 minutes three times per day, avoid pressure garments as he feels this worsened swelling. Vascular Ultrasound reviewed with patient and is negative. Lasix 10mg  on 11/2 19. Restless leg syndrome: started Requip 0.25mg  HS.    >30 minutes spent in discharge of patient including review of medications and follow-up appointments, physical examination, and in answering all patient's questions    LOS: 26 days A FACE TO FACE EVALUATION WAS [place] 08/26/2021, 9:23 AM

## 2021-08-26 NOTE — Progress Notes (Signed)
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 Provided prn Tylenol and repositioned will inform assigned nurse, continue to monitor and assessed

## 2021-08-27 ENCOUNTER — Encounter (HOSPITAL_COMMUNITY): Payer: Self-pay | Admitting: Radiology

## 2021-08-27 ENCOUNTER — Other Ambulatory Visit: Payer: Self-pay | Admitting: *Deleted

## 2021-08-27 NOTE — Patient Outreach (Signed)
Member screened for potential [facility] care coordination needs. Mr. Strausbaugh recently admitted to [facility].   Notification sent to [facility] SNF SW to make aware writer is following for transition plans and potential [facility] services.   Will continue to follow while member resides in SNF.    Marthenia Rolling, MSN, RN,BSN [place] Acute Care Coordinator [phone] [facility]) [phone]  (Toll free office)

## 2021-08-29 DIAGNOSIS — J449 Chronic obstructive pulmonary disease, unspecified: Secondary | ICD-10-CM | POA: Diagnosis not present

## 2021-08-29 DIAGNOSIS — K219 Gastro-esophageal reflux disease without esophagitis: Secondary | ICD-10-CM | POA: Diagnosis not present

## 2021-08-29 DIAGNOSIS — F419 Anxiety disorder, unspecified: Secondary | ICD-10-CM | POA: Diagnosis not present

## 2021-08-29 DIAGNOSIS — I10 Essential (primary) hypertension: Secondary | ICD-10-CM | POA: Diagnosis not present

## 2021-08-29 DIAGNOSIS — F32A Depression, unspecified: Secondary | ICD-10-CM | POA: Diagnosis not present

## 2021-08-29 DIAGNOSIS — I639 Cerebral infarction, unspecified: Secondary | ICD-10-CM | POA: Diagnosis not present

## 2021-08-29 DIAGNOSIS — E1151 Type 2 diabetes mellitus with diabetic peripheral angiopathy without gangrene: Secondary | ICD-10-CM | POA: Diagnosis not present

## 2021-08-29 DIAGNOSIS — R482 Apraxia: Secondary | ICD-10-CM | POA: Diagnosis not present

## 2021-08-29 DIAGNOSIS — C07 Malignant neoplasm of parotid gland: Secondary | ICD-10-CM | POA: Diagnosis not present

## 2021-09-02 ENCOUNTER — Encounter: Payer: Self-pay | Admitting: *Deleted

## 2021-09-06 DIAGNOSIS — I10 Essential (primary) hypertension: Secondary | ICD-10-CM | POA: Diagnosis not present

## 2021-09-06 DIAGNOSIS — J449 Chronic obstructive pulmonary disease, unspecified: Secondary | ICD-10-CM | POA: Diagnosis not present

## 2021-09-06 DIAGNOSIS — I69354 Hemiplegia and hemiparesis following cerebral infarction affecting left non-dominant side: Secondary | ICD-10-CM | POA: Diagnosis not present

## 2021-09-15 ENCOUNTER — Encounter
Payer: Medicare Other | Attending: Physical Medicine and Rehabilitation | Admitting: Physical Medicine and Rehabilitation

## 2021-09-17 DIAGNOSIS — I10 Essential (primary) hypertension: Secondary | ICD-10-CM | POA: Diagnosis not present

## 2021-09-17 DIAGNOSIS — I6381 Other cerebral infarction due to occlusion or stenosis of small artery: Secondary | ICD-10-CM | POA: Diagnosis not present

## 2021-09-17 DIAGNOSIS — J449 Chronic obstructive pulmonary disease, unspecified: Secondary | ICD-10-CM | POA: Diagnosis not present

## 2021-09-17 DIAGNOSIS — H04123 Dry eye syndrome of bilateral lacrimal glands: Secondary | ICD-10-CM | POA: Diagnosis not present

## 2021-09-17 DIAGNOSIS — F419 Anxiety disorder, unspecified: Secondary | ICD-10-CM | POA: Diagnosis not present

## 2021-09-17 DIAGNOSIS — E1165 Type 2 diabetes mellitus with hyperglycemia: Secondary | ICD-10-CM | POA: Diagnosis not present

## 2021-09-17 DIAGNOSIS — N183 Chronic kidney disease, stage 3 unspecified: Secondary | ICD-10-CM | POA: Diagnosis not present

## 2021-09-17 DIAGNOSIS — I2510 Atherosclerotic heart disease of native coronary artery without angina pectoris: Secondary | ICD-10-CM | POA: Diagnosis not present

## 2021-09-17 DIAGNOSIS — E46 Unspecified protein-calorie malnutrition: Secondary | ICD-10-CM | POA: Diagnosis not present

## 2021-09-17 DIAGNOSIS — G8194 Hemiplegia, unspecified affecting left nondominant side: Secondary | ICD-10-CM | POA: Diagnosis not present

## 2021-09-17 DIAGNOSIS — K219 Gastro-esophageal reflux disease without esophagitis: Secondary | ICD-10-CM | POA: Diagnosis not present

## 2021-09-17 DIAGNOSIS — F32A Depression, unspecified: Secondary | ICD-10-CM | POA: Diagnosis not present

## 2021-09-17 DIAGNOSIS — E785 Hyperlipidemia, unspecified: Secondary | ICD-10-CM | POA: Diagnosis not present

## 2021-09-17 DIAGNOSIS — F17200 Nicotine dependence, unspecified, uncomplicated: Secondary | ICD-10-CM | POA: Diagnosis not present

## 2021-09-17 DIAGNOSIS — J309 Allergic rhinitis, unspecified: Secondary | ICD-10-CM | POA: Diagnosis not present

## 2021-09-21 ENCOUNTER — Other Ambulatory Visit: Payer: Self-pay | Admitting: *Deleted

## 2021-09-21 NOTE — Patient Outreach (Signed)
Member screened for potential [facility] care coordination needs. Mr. Kleinsasser resides in [facility].  Update received from Margot Chimes [facility] SN[facility] SW indicating Mr. Westerhold's family appealed discharge. States Mr. Bogosian will likely transition to his son's home post SNF. Bryson Ha reports an ALF list was provided to family.  Writer will plan outreach to contact family to discuss [facility] care coordination needs. PCP office has [facility] embedded care coordination team.    Marthenia Rolling, MSN, RN,BSN [place] Acute Care Coordinator [phone] [facility]) [phone]  (Toll free office)

## 2021-09-25 NOTE — Progress Notes (Signed)
Chronic Care Management Pharmacy Note   Summary: Post hospital follow up conversation with son.  Patient glucose running 170-220s since returning home from the hospital.  He is taking one-half tablet of glimepiride 87m bid.  No hypoglycemia.  Starting to work on dDesigner, industrial/product  Also LDL elevated > 70 at hospital stay on simvastatin 411m  They started amlodipine in hospital which has potential for myalgia/rhabdo with simvastatin doses over 2074m Also mentions vivid dreams.  Consider:  Switch to Farxiga 5mg106mily (renal and CV benefit) - will help with patient assistance forms for approval.  Switch to Rosuvastatin 20mg62m better LDL lowering and no interaction with amlodipine.  Have also asked them to monitor BP closely at home x 2 weeks and will fu.  10/02/2021 Name:  Barry [name]  [id number]  [birth year]36jective: Barry SHECKLERn 87 y.87 year old male who is a primary patient of Pickard, WarreCammie Mcgee  The CCM team was consulted for assistance with disease management and care coordination needs.    Engaged with patient by telephone for follow up visit in response to provider referral for pharmacy case management and/or care coordination services.   Consent to Services:  The patient was given the following information about Chronic Care Management services today, agreed to services, and gave verbal consent: 1. CCM service includes personalized support from designated clinical staff supervised by the primary care provider, including individualized plan of care and coordination with other care providers 2. 24/7 contact phone numbers for assistance for urgent and routine care needs. 3. Service will only be billed when office clinical staff spend 20 minutes or more in a month to coordinate care. 4. Only one practitioner may furnish and bill the service in a calendar month. 5.The patient may stop CCM services at any time (effective at the end of the month) by phone  call to the office staff. 6. The patient will be responsible for cost sharing (co-pay) of up to 20% of the service fee (after annual deductible is met). Patient agreed to services and consent obtained.  Patient Care Team: PickaSusy Frizzleas PCP - General (Family Medicine) Early, Todd Arvilla Meres(Vascular Surgery) SandeSherlynn Stalls(Ophthalmology) Hall,Allyn Kenner(Dermatology) NishaJosue Hector(Cardiology) DavisEdythe Clarity aFloyd [facility] Hospitalharmacist (Pharmacist) Ross,Cloria Springas Consulting Physician (BehavBlue Diamondcent office visits:  03/09/21 Dr. PickaDennard Schaumannfollow-up. No medication changes.    Recent consult visits:  04/02/21 (Video Visit) BehavEl Portal,Cloria Spring For generalized anxiety.    Hospital visits:  07/27/21 (ED - Hospital) - bilateral CVA, metoprolol and amlodipine were added due to elevated BP  Objective:  Lab Results  Component Value Date   CREATININE 1.13 08/11/2021   BUN 25 (H) 08/11/2021   GFR 76.82 07/10/2013   GFRNONAA >60 08/11/2021   GFRAA 66 03/19/2021   NA 134 (L) 08/11/2021   K 4.0 08/11/2021   CALCIUM 8.7 (L) 08/11/2021   CO2 25 08/11/2021    Lab Results  Component Value Date/Time   HGBA1C 6.7 (H) 07/27/2021 12:31 PM   HGBA1C 6.9 (H) 03/19/2021 09:15 AM   GFR 76.82 07/10/2013 12:13 PM   GFR 69.02 06/06/2012 10:45 AM   MICROALBUR 232.9 03/19/2021 09:15 AM   MICROALBUR 25.6 11/17/2017 08:38 AM    Last diabetic Eye exam:  Lab Results  Component Value Date/Time   HMDIABEYEEXA Retinopathy (A) 03/03/2021 10:05 AM  Last diabetic Foot exam:  Lab Results  Component Value Date/Time   HMDIABFOOTEX yes 04/29/2009 12:00 AM     Lab Results  Component Value Date   CHOL 164 07/27/2021   HDL 64 07/27/2021   LDLCALC 84 07/27/2021   LDLDIRECT 152.6 05/28/2008   TRIG 81 07/27/2021   CHOLHDL 2.6 07/27/2021    Hepatic Function Latest Ref Rng & Units 08/03/2021 07/27/2021 03/19/2021  Total Protein 6.5 - 8.1 g/dL 5.4(L) 7.9  5.8(L)  Albumin 3.5 - 5.0 g/dL 2.4(L) 4.4 -  AST 15 - 41 U/L _0 ALT 0 - 44 U/L _1 Alk Phosphatase 38 - 126 U/L 36(L) 49 -  Total Bilirubin 0.3 - 1.2 mg/dL 0.6 0.5 0.3  Bilirubin, Direct <=0.2 mg/dL - - -    Lab Results  Component Value Date/Time   TSH 1.266 12/21/2018 07:21 AM   TSH 2.071 10/27/2018 06:58 AM   TSH 2.080 02/05/2015 08:17 AM   TSH 1.59 06/06/2012 10:45 AM    CBC Latest Ref Rng & Units 08/03/2021 07/31/2021 07/29/2021  WBC 4.0 - 10.5 K/uL 9.5 8.2 6.6  Hemoglobin 13.0 - 17.0 g/dL 13.5 14.2 14.2  Hematocrit 39.0 - 52.0 % 40.1 44.5 42.9  Platelets 150 - 400 K/uL 231 200 198    Lab Results  Component Value Date/Time   VD25OH 23.10 (L) 08/04/2021 05:20 AM    Clinical ASCVD: Yes  The ASCVD Risk score (Arnett DK, et al., 2019) failed to calculate for the following reasons:   The 2019 ASCVD risk score is only valid for ages 22 to 35   The patient has a prior MI or stroke diagnosis    Depression screen [facility] 2/9 02/13/2021 11/10/2020 02/22/2020  Decreased Interest 0 0 0  Down, Depressed, Hopeless 0 0 0  PHQ - 2 Score 0 0 0  Altered sleeping 1 2 0  Tired, decreased energy 1 1 0  Change in appetite 0 0 0  Feeling bad or failure about yourself  0 0 0  Trouble concentrating 0 0 0  Moving slowly or fidgety/restless 0 0 0  Suicidal thoughts 0 0 0  PHQ-9 Score 2 3 0  Difficult doing work/chores Not difficult at all Not difficult at all Not difficult at all  Some recent data might be hidden      Social History   Tobacco Use  Smoking Status Every Day   Packs/day: 1.00   Years: 60.00   Pack years: 60.00   Types: Cigarettes  Smokeless Tobacco Never  Tobacco Comments   smokes about 16-20 cigs a day as of 04/04/2013   BP Readings from Last 3 Encounters:  08/26/21 (!) 147/67  07/31/21 (!) 179/84  03/09/21 (!) 144/70   Pulse Readings from Last 3 Encounters:  08/26/21 70  07/31/21 68  03/09/21 (!) 58   Wt Readings from Last 3 Encounters:  08/05/21  163 lb 9.3 oz (74.2 kg)  07/27/21 165 lb 12.6 oz (75.2 kg)  03/09/21 167 lb (75.8 kg)    Assessment/Interventions: Review of patient past medical history, allergies, medications, health status, including review of consultants reports, laboratory and other test data, was performed as part of comprehensive evaluation and provision of chronic care management services.   SDOH:  (Social Determinants of Health) assessments and interventions performed: No   CCM Care Plan  Allergies  Allergen Reactions   Lisinopril-Hydrochlorothiazide Swelling    ZESTRIL   Doxycycline Rash    Medications Reviewed Today  Reviewed by Edythe Clarity, RPH (Pharmacist) on 10/02/21 at [facility] List Status: <None>   Medication Order Taking? Sig Documenting Provider Last Dose Status Informant  acetaminophen (TYLENOL) 325 MG tablet [id number] Yes Take 2 tablets (650 mg total) by mouth every 6 (six) hours as needed for mild pain (or Fever >/= 101). Cathlyn Parsons, PA-C Taking Active   amLODipine (NORVASC) 10 MG tablet [id number] Yes Take 1 tablet (10 mg total) by mouth daily. Cathlyn Parsons, PA-C Taking Active   artificial tears (LACRILUBE) OINT ophthalmic ointment [id number] Yes Place into both eyes every 4 (four) hours as needed for dry eyes. Angiulli, Lavon Paganini, PA-C Taking Active   B Complex-C (B-COMPLEX WITH VITAMIN C) tablet [id number] Yes Take 1 tablet by mouth daily. Cathlyn Parsons, PA-C Taking Active   clonazepam (KLONOPIN) 0.125 MG disintegrating tablet [id number] Yes Take 1 tablet (0.125 mg total) by mouth daily. Cathlyn Parsons, PA-C Taking Active   clopidogrel (PLAVIX) 75 MG tablet [id number] Yes Take 75 mg by mouth daily. [provider] Taking Active   diclofenac Sodium (VOLTAREN) 1 % GEL [id number] Yes Apply 2 g topically 4 (four) times daily. Cathlyn Parsons, PA-C Taking Active   ezetimibe (ZETIA) 10 MG tablet [id number] Yes TAKE 1 TABLET BY MOUTH DAILY, KEEP APPOINTMENT FOR  FUTURE REFILL GENERIC EQUIVALENT FOR ZETIA Crenshaw, Denice Bors, MD Taking Active Multiple Informants  FLUoxetine (PROZAC) 20 MG capsule [id number] Yes TAKE (1) CAPSULE BY MOUTH EACH MORNING. Cloria Spring, MD Taking Active Multiple Informants  glimepiride (AMARYL) 1 MG tablet [id number] Yes Take 0.5 tablets (0.5 mg total) by mouth 2 (two) times daily. Cathlyn Parsons, PA-C Taking Active   hydrALAZINE (APRESOLINE) 100 MG tablet [id number] Yes Take 1 tablet (100 mg total) by mouth every 8 (eight) hours. Cathlyn Parsons, PA-C Taking Active   loratadine (CLARITIN) 10 MG tablet [id number] Yes Take 1 tablet (10 mg total) by mouth daily. Cathlyn Parsons, PA-C Taking Active   magnesium gluconate (MAGONATE) 500 MG tablet [id number] Yes Take 0.5 tablets (250 mg total) by mouth at bedtime. Cathlyn Parsons, PA-C Taking Active   metoprolol succinate (TOPROL-XL) 25 MG 24 hr tablet [id number] Yes Take 0.5 tablets (12.5 mg total) by mouth daily. Cathlyn Parsons, PA-C Taking Active   montelukast (SINGULAIR) 10 MG tablet [id number] Yes Take 1 tablet (10 mg total) by mouth at bedtime. [place], Modena Nunnery, MD Taking Active Multiple Informants  pantoprazole (PROTONIX) 40 MG tablet [id number]  Take 1 tablet (40 mg total) by mouth daily. Deatra James, MD  Expired 08/30/21 2359   rOPINIRole (REQUIP) 0.25 MG tablet [id number] Yes Take 1 tablet (0.25 mg total) by mouth at bedtime. Cathlyn Parsons, PA-C Taking Active   simvastatin (ZOCOR) 40 MG tablet [id number] Yes TAKE (1) TABLET BY MOUTH AT BEDTIME.  Patient taking differently: Take 40 mg by mouth at bedtime.   Susy Frizzle, MD Taking Active Multiple Informants  traZODone (DESYREL) 50 MG tablet [id number] Yes Take 1 tablet (50 mg total) by mouth at bedtime. Cathlyn Parsons, PA-C Taking Active   vitamin B-12 (CYANOCOBALAMIN) 1000 MCG tablet [id number] Yes Take 1,000 mcg by mouth daily. [provider] Taking Active Multiple Informants             Patient Active Problem List   Diagnosis Date Noted   Right thalamic infarction ([place]) 07/31/2021   Stage 3a chronic kidney disease ([place])    Benign essential HTN  Left hemiparesis ([place])    Type 2 diabetes mellitus with hyperglycemia, without long-term current use of insulin (HCC)    CVA (cerebral vascular accident) ([place]) 07/27/2021   Frequent PVCs 03/20/2020   Aortic stenosis 03/20/2020   COPD (chronic obstructive pulmonary disease) ([place]) 03/20/2020   Constipation 04/30/2019   Protein-calorie malnutrition ([place]) 04/30/2019   Anxiety with depression 12/05/2018   Seasonal and perennial allergic rhinitis 08/09/2018   Carotid artery disease ([place]) 11/18/2017   Hx of colonic polyps 08/26/2015   Erectile dysfunction 02/17/2015   AAA (abdominal aortic aneurysm) without rupture ([place]) 05/21/2014   Chest pain 01/31/2014   CAD (coronary artery disease) 12/24/2013   HEARING LOSS, CONDUCTIVE, LEFT 02/23/2010   DIZZINESS, CHRONIC 05/28/2008   PULMONARY NODULE 09/07/2007   Diabetes mellitus type II, controlled ([place]) 09/06/2007   Hyperlipidemia 09/06/2007   TOBACCO ABUSE 09/06/2007   Essential hypertension 09/06/2007   Allergic rhinitis 09/06/2007   GERD 09/06/2007   BASAL CELL CARCINOMA, NOSE 09/06/2007    Immunization History  Administered Date(s) Administered   Fluad Quad(high Dose 65+) 07/12/2019, 12/15/2020, 08/12/2021   H1N1 10/15/2008   Influenza Split 07/12/2011, 08/18/2012   Influenza Whole 09/25/2007, 08/23/2008, 08/25/2010   Influenza, High Dose Seasonal PF 08/01/2013, 08/16/2014, 09/05/2018   Influenza-Unspecified 08/05/2015, 08/28/2016, 09/02/2017   Moderna Sars-Covid-2 Vaccination 11/19/2019, 12/17/2019, 11/18/2020   Pneumococcal Conjugate-13 02/17/2015   Td 06/06/2012    Conditions to be addressed/monitored:  HTN, CAD, GERD, Type II DM, HLD,  Depression/Anxiety.    Care Plan : General Pharmacy (Adult)  Updates made by Edythe Clarity, RPH since 10/02/2021  12:00 AM     Problem: HTN, HLD, Depression/Anxiety, GERD, Type II DM   Priority: High  Onset Date: 01/01/2021     Long-Range Goal: Patient-Specific Goal   Start Date: 01/01/2021  Expected End Date: 07/04/2021  Recent Progress: On track  Priority: High  Note:   Current Barriers:  Unable to independently monitor therapeutic efficacy Unable to maintain control of blood pressure Possible subtherapeutic regimen for DM LDL above goal  Pharmacist Clinical Goal(s):  Over the next 180 days, patient will achieve adherence to monitoring guidelines and medication adherence to achieve therapeutic efficacy achieve control of blood pressure as evidenced by home monitoring maintain control of blood sugar as evidenced by A1c/glucose   adhere to prescribed medication regimen as evidenced by fill dates through collaboration with PharmD and provider.   Interventions: 1:1 collaboration with Buelah Manis, Modena Nunnery, MD regarding development and update of comprehensive plan of care as evidenced by provider attestation and co-signature Inter-disciplinary care team collaboration (see longitudinal plan of care) Comprehensive medication review performed; medication list updated in electronic medical record  Hypertension (BP goal <130/80) -Not ideally controlled -Current treatment: Metoprolol XL 12.57m Amlodipine 159mdaily Hydralazine 1003m8h -Medications previously tried: lisinopril/HCTZ (swelling), Nifedipine (pt d/c on his own)  -Current home readings: unavailable, pt not really checking -Denies hypotensive/hypertensive symptoms -Educated on BP goals and benefits of medications for prevention of heart attack, stroke and kidney damage; Importance of home blood pressure monitoring; Symptoms of hypotension and importance of maintaining adequate hydration; -Counseled to monitor BP at home at least a few times per week, document, and provide log at future appointments -He does not monitor often due to  getting worked up over it -He stopped nifedipine on his own and his last office blood pressure was WNL.  He reports he was not taking it at this time. -Counseled him on need for home monitoring to adjust medications or monitor  for increased/decreased BP/ -Recommended to continue current medication Recommended follow up with cardiology on nifedipine.  Update 10/01/21 BP @ home?? - they have not been checking BP at home, but they did just get a cuff. Counseled on appropriate blood pressure goals. Previously on ramipril and HCTZ so I have asked that they monitor closely for 2 weeks and we will check back to determine if anything else needs to be added back.  In the mean time watch salt in diet, contact me if they are noticing consistent readings > 140/90 before the two week period. No changes to medications at this time.   Hyperlipidemia/CAD: (LDL goal < 70) -Controlled -Current treatment: Simvastatin 80m Ezetimibe 166m-Medications previously tried: none noted -Educated on Cholesterol goals;  Benefits of statin for ASCVD risk reduction;  -Continues to be adherent' -Most recent lipid panel excellent, congratulations on this! -Recommended to continue current medication  Update 10/01/21 LDL in hospital was 84 - goal < 70. Patient is hight risk due to previous stroke. Currently on moderate intensity statin.  There is also the potential of increased myopathy and rhabdomyolysis with amlodipine and simvastatin doses over 2053m Based on that I would consider a switch to Crestor 66m7mily for better LDL lowering and no interaction with amlodipine.  Diabetes (A1c goal <7%) -Controlled -Current medications: Glimepiride 1mg 68malf tablet daily -Medications previously tried: metformin (headaches)  -Current home glucose readings fasting glucose: 100-115 most mornings post prandial glucose: none available -Denies hypoglycemic/hyperglycemic symptoms -Current meal patterns: reports a good  balanced diet -Exercise - active with yard work for himself and neighbors -Educated on A1c and blood sugar goals; Benefits of routine self-monitoring of blood sugar; -Counseled to check feet daily and get yearly eye exams -Denies hypoglycemia since decreased dose of glimepiride, continue to monitor -Recommended to continue current medication  Update 10/01/21 170-227 have been the range of sugars since coming home from rehab, no episodes of hypoglycemia. Monitoring sugars at home? 220s mid day today D/c Jardiance? - He does not remember why he stopped this, there was also a mention of a trial of Farxiga, however he reports that cost was the barrier in him taking this. Would he consider Jardiance/Farxiga  if price was not an issue? Yes the only barrier he can remember is cost. We spent a lot of time reviewing diet and portion control.  Counseled on limiting carbs to one source per meal.   For cardiovascular and renal benefits, I would consider a switch to Farxiga 5mg d56my and d/c glimepiride.  Work on carbohydrate servings and excess sugars.   Future plans to introduce low dose metformin if glucose still not at goal. Of course this is only if we can get him approved for patient assistance program. FU in 2 weeks on glucose readings and PAP application.  Depression/Anxiety (Goal: Control symptoms) -Controlled -Current treatment: Fluoxetine 66mg C68mzepam 0.125mg pr50medications previously tried/failed: none noted -Educated on Benefits of medication for symptom control  -Pt reports his mood has been great lately, no issues with sleep -Recommended to continue current medication  Update 10/01/21 Mentions vivid dreams at night, the son can hear him tossing and turning all night.  Mr. Theurer Mattyt realize he has not slept well the next morning. Started ropinirole recently in hospital due to restless legs.  This can often cause vivid dreams and sleep disturbance especially at the  beginning of treatment.  Have asked his son to trial him off of this for a few nights to  see if this resolves the issue. Has FU with PCP tomorrow 12/16 - will discuss at that appt.  GERD (Goal: Minimize symptoms) -Controlled -Current treatment  Famotidine 78m prn -Medications previously tried: none noted -No longer taking famotidine scheduled, just takes prn -Reports his symptoms are very rare -Recommended to continue current medication   Patient Goals/Self-Care Activities Over the next 180 days, patient will:  - take medications as prescribed check glucose daily, document, and provide at future appointments check blood pressure at least a few times per week, document, and provide at future appointments  Follow Up Plan: The care management team will reach out to the patient again over the next 90 days.             Medication Assistance: None required.  Patient affirms current coverage meets needs.  [facility] [place] [id number]: [phone]: [phone] Uses pill box? Yes Pt endorses 100% compliance  We discussed: Benefits of medication synchronization, packaging and delivery as well as enhanced pharmacist oversight with [employer]. Patient decided to: Continue current medication management strategy  Care Plan and Follow Up Patient Decision:  Patient agrees to Care Plan and Follow-up.  Plan: The care management team will reach out to the patient again over the next 180 days.  CBeverly Milch PharmD, CPP Clinical Pharmacist Practitioner [place]([phone]

## 2021-09-27 DIAGNOSIS — M1990 Unspecified osteoarthritis, unspecified site: Secondary | ICD-10-CM | POA: Diagnosis not present

## 2021-09-27 DIAGNOSIS — I2510 Atherosclerotic heart disease of native coronary artery without angina pectoris: Secondary | ICD-10-CM | POA: Diagnosis not present

## 2021-09-27 DIAGNOSIS — E46 Unspecified protein-calorie malnutrition: Secondary | ICD-10-CM | POA: Diagnosis not present

## 2021-09-27 DIAGNOSIS — F17210 Nicotine dependence, cigarettes, uncomplicated: Secondary | ICD-10-CM | POA: Diagnosis not present

## 2021-09-27 DIAGNOSIS — E1151 Type 2 diabetes mellitus with diabetic peripheral angiopathy without gangrene: Secondary | ICD-10-CM | POA: Diagnosis not present

## 2021-09-27 DIAGNOSIS — K219 Gastro-esophageal reflux disease without esophagitis: Secondary | ICD-10-CM | POA: Diagnosis not present

## 2021-09-27 DIAGNOSIS — N183 Chronic kidney disease, stage 3 unspecified: Secondary | ICD-10-CM | POA: Diagnosis not present

## 2021-09-27 DIAGNOSIS — Z993 Dependence on wheelchair: Secondary | ICD-10-CM | POA: Diagnosis not present

## 2021-09-27 DIAGNOSIS — E785 Hyperlipidemia, unspecified: Secondary | ICD-10-CM | POA: Diagnosis not present

## 2021-09-27 DIAGNOSIS — Z9852 Vasectomy status: Secondary | ICD-10-CM | POA: Diagnosis not present

## 2021-09-27 DIAGNOSIS — F32A Depression, unspecified: Secondary | ICD-10-CM | POA: Diagnosis not present

## 2021-09-27 DIAGNOSIS — Z9181 History of falling: Secondary | ICD-10-CM | POA: Diagnosis not present

## 2021-09-27 DIAGNOSIS — G2581 Restless legs syndrome: Secondary | ICD-10-CM | POA: Diagnosis not present

## 2021-09-27 DIAGNOSIS — I69354 Hemiplegia and hemiparesis following cerebral infarction affecting left non-dominant side: Secondary | ICD-10-CM | POA: Diagnosis not present

## 2021-09-27 DIAGNOSIS — E1122 Type 2 diabetes mellitus with diabetic chronic kidney disease: Secondary | ICD-10-CM | POA: Diagnosis not present

## 2021-09-27 DIAGNOSIS — M6281 Muscle weakness (generalized): Secondary | ICD-10-CM | POA: Diagnosis not present

## 2021-09-27 DIAGNOSIS — I129 Hypertensive chronic kidney disease with stage 1 through stage 4 chronic kidney disease, or unspecified chronic kidney disease: Secondary | ICD-10-CM | POA: Diagnosis not present

## 2021-09-27 DIAGNOSIS — F419 Anxiety disorder, unspecified: Secondary | ICD-10-CM | POA: Diagnosis not present

## 2021-09-27 DIAGNOSIS — Z7984 Long term (current) use of oral hypoglycemic drugs: Secondary | ICD-10-CM | POA: Diagnosis not present

## 2021-09-27 DIAGNOSIS — J449 Chronic obstructive pulmonary disease, unspecified: Secondary | ICD-10-CM | POA: Diagnosis not present

## 2021-09-28 DIAGNOSIS — I2510 Atherosclerotic heart disease of native coronary artery without angina pectoris: Secondary | ICD-10-CM | POA: Diagnosis not present

## 2021-09-28 DIAGNOSIS — N183 Chronic kidney disease, stage 3 unspecified: Secondary | ICD-10-CM | POA: Diagnosis not present

## 2021-09-28 DIAGNOSIS — I129 Hypertensive chronic kidney disease with stage 1 through stage 4 chronic kidney disease, or unspecified chronic kidney disease: Secondary | ICD-10-CM | POA: Diagnosis not present

## 2021-09-28 DIAGNOSIS — J449 Chronic obstructive pulmonary disease, unspecified: Secondary | ICD-10-CM | POA: Diagnosis not present

## 2021-09-28 DIAGNOSIS — E1122 Type 2 diabetes mellitus with diabetic chronic kidney disease: Secondary | ICD-10-CM | POA: Diagnosis not present

## 2021-09-28 DIAGNOSIS — I69354 Hemiplegia and hemiparesis following cerebral infarction affecting left non-dominant side: Secondary | ICD-10-CM | POA: Diagnosis not present

## 2021-09-29 DIAGNOSIS — I69354 Hemiplegia and hemiparesis following cerebral infarction affecting left non-dominant side: Secondary | ICD-10-CM | POA: Diagnosis not present

## 2021-09-29 DIAGNOSIS — E1122 Type 2 diabetes mellitus with diabetic chronic kidney disease: Secondary | ICD-10-CM | POA: Diagnosis not present

## 2021-09-29 DIAGNOSIS — I2510 Atherosclerotic heart disease of native coronary artery without angina pectoris: Secondary | ICD-10-CM | POA: Diagnosis not present

## 2021-09-29 DIAGNOSIS — J449 Chronic obstructive pulmonary disease, unspecified: Secondary | ICD-10-CM | POA: Diagnosis not present

## 2021-09-29 DIAGNOSIS — N183 Chronic kidney disease, stage 3 unspecified: Secondary | ICD-10-CM | POA: Diagnosis not present

## 2021-09-29 DIAGNOSIS — I129 Hypertensive chronic kidney disease with stage 1 through stage 4 chronic kidney disease, or unspecified chronic kidney disease: Secondary | ICD-10-CM | POA: Diagnosis not present

## 2021-09-30 ENCOUNTER — Other Ambulatory Visit: Payer: Self-pay | Admitting: *Deleted

## 2021-09-30 DIAGNOSIS — E1122 Type 2 diabetes mellitus with diabetic chronic kidney disease: Secondary | ICD-10-CM | POA: Diagnosis not present

## 2021-09-30 DIAGNOSIS — J449 Chronic obstructive pulmonary disease, unspecified: Secondary | ICD-10-CM | POA: Diagnosis not present

## 2021-09-30 DIAGNOSIS — I129 Hypertensive chronic kidney disease with stage 1 through stage 4 chronic kidney disease, or unspecified chronic kidney disease: Secondary | ICD-10-CM | POA: Diagnosis not present

## 2021-09-30 DIAGNOSIS — I69354 Hemiplegia and hemiparesis following cerebral infarction affecting left non-dominant side: Secondary | ICD-10-CM | POA: Diagnosis not present

## 2021-09-30 DIAGNOSIS — N183 Chronic kidney disease, stage 3 unspecified: Secondary | ICD-10-CM | POA: Diagnosis not present

## 2021-09-30 DIAGNOSIS — I2510 Atherosclerotic heart disease of native coronary artery without angina pectoris: Secondary | ICD-10-CM | POA: Diagnosis not present

## 2021-09-30 DIAGNOSIS — I10 Essential (primary) hypertension: Secondary | ICD-10-CM

## 2021-09-30 NOTE — Patient Outreach (Signed)
[facility] Post- Acute Care Coordinator follow up. Screened for [facility] care coordination needs. Mr. Dermody transitioned from [facility] on 09/25/21.  Telephone call made to Mr. Regino [phone] to discuss [facility] follow up. No answer. Phone just rang and rang. Telephone call made to son/DPR Pamalee Leyden [phone]. Patient identifiers confirmed.   Darrell reports Mr. Daubert is living with him indefinitely. Darrell states he does not want Mr. Nierenberg to live in a nursing home. He has arranged caregiver assistance from 8am to 12pm. States he is still looking for private caregiver assistance. States he is following up on some "leads." States the agencies and ALFs are costly. [facility] is providing PT/OT/RN/SW services. States Mr. Nesmith is doing okay. Report Mr. Yager continues to have lower extremity weakness. States they have been working on the exercises PT has  provided as well. Darrell states his daughter will be home from college and will be able to assist some during the holiday break.   Darrell indicates Mr. Rosiles has a PCP appointment on Friday, Dec. 16th. Darrell provides transportation. Darrell states he is interested in Dr. Dennard Schaumann looking at the number of medications Mr. Mcclurg is on. Darrell is hopeful that some medications can be cut back. Darrell states Mr. Vaeth is on about 15 pills. Discussed that Mr. Kinderman appears to have a PharmD with Dr. Samella Parr office that is active. Discussed that writer will send message and referral to alert that [name] is primary contact at this time. Also discussed follow up with [place] Coordinator with Dr. Samella Parr office for care coordination and disease management. Darrell is agreeable to referral.  Writer will make referral for [facility] embedded RNCM services for care coordination and complex case management. Will send communication to PharmD as well to make aware that Adriana Lina (son/DPR) is primary contact  [phone] at this time.   Marthenia Rolling, MSN, RN,BSN [place] Acute Care Coordinator [phone] [facility]) [phone]  (Toll free office)

## 2021-10-01 ENCOUNTER — Telehealth: Payer: Self-pay | Admitting: *Deleted

## 2021-10-01 ENCOUNTER — Ambulatory Visit (INDEPENDENT_AMBULATORY_CARE_PROVIDER_SITE_OTHER): Payer: Medicare Other | Admitting: Pharmacist

## 2021-10-01 DIAGNOSIS — I10 Essential (primary) hypertension: Secondary | ICD-10-CM

## 2021-10-01 DIAGNOSIS — I2510 Atherosclerotic heart disease of native coronary artery without angina pectoris: Secondary | ICD-10-CM

## 2021-10-01 DIAGNOSIS — E7800 Pure hypercholesterolemia, unspecified: Secondary | ICD-10-CM

## 2021-10-01 DIAGNOSIS — E119 Type 2 diabetes mellitus without complications: Secondary | ICD-10-CM

## 2021-10-01 NOTE — Chronic Care Management (AMB) (Signed)
°  Care Management   Note  10/01/2021 Name: Barry [name] MRN: [id number] DOB: 1933/03/21  Barry [name] is a 87 y.o. year old male who is a primary care patient of Susy Frizzle, MD and is actively engaged with the care management team. I reached out to [name] by phone today to assist with scheduling an initial visit with the RN Case Manager  Follow up plan: Unsuccessful telephone outreach attempt made. A HIPAA compliant phone message was left for the patient providing contact information and requesting a return call.  The care management team will reach out to the patient again over the next 7 days.  If patient returns call to provider office, please advise to call [place] at [phone].  [place] Management  Direct Dial: [phone]

## 2021-10-02 ENCOUNTER — Ambulatory Visit (INDEPENDENT_AMBULATORY_CARE_PROVIDER_SITE_OTHER): Payer: Medicare Other | Admitting: Family Medicine

## 2021-10-02 ENCOUNTER — Encounter: Payer: Self-pay | Admitting: Family Medicine

## 2021-10-02 ENCOUNTER — Other Ambulatory Visit: Payer: Self-pay

## 2021-10-02 VITALS — BP 142/78 | HR 60 | Resp 18 | Ht 71.0 in | Wt 171.0 lb

## 2021-10-02 DIAGNOSIS — R29898 Other symptoms and signs involving the musculoskeletal system: Secondary | ICD-10-CM

## 2021-10-02 DIAGNOSIS — E119 Type 2 diabetes mellitus without complications: Secondary | ICD-10-CM | POA: Diagnosis not present

## 2021-10-02 DIAGNOSIS — E7800 Pure hypercholesterolemia, unspecified: Secondary | ICD-10-CM | POA: Diagnosis not present

## 2021-10-02 DIAGNOSIS — I6523 Occlusion and stenosis of bilateral carotid arteries: Secondary | ICD-10-CM

## 2021-10-02 DIAGNOSIS — K118 Other diseases of salivary glands: Secondary | ICD-10-CM

## 2021-10-02 DIAGNOSIS — M25512 Pain in left shoulder: Secondary | ICD-10-CM | POA: Diagnosis not present

## 2021-10-02 DIAGNOSIS — I6340 Cerebral infarction due to embolism of unspecified cerebral artery: Secondary | ICD-10-CM

## 2021-10-02 MED ORDER — FLUTICASONE PROPIONATE 50 MCG/ACT NA SUSP: 2.0000 | Freq: Every day | NASAL | 6 refills | Status: DC

## 2021-10-02 MED ORDER — ROSUVASTATIN CALCIUM 20 MG PO TABS: 20.0000 mg | ORAL_TABLET | Freq: Every day | ORAL | 3 refills | Status: DC

## 2021-10-02 NOTE — Progress Notes (Signed)
Subjective:    Patient ID: Barry [name], male    DOB: 11/26/1932, 87 y.o.   MRN: [id number]  HPI Hospitalized 10/10-10/14 with CVA- see DC summary below:   PCP: Susy Frizzle, MD   Disposition:  CIR    Recommendations for Outpatient Follow-up:    Follow up: Neurologist, continue aspirin Plavix, Neurologist regarding uncontrolled restless leg syndrome on this admission Lyrica was added, continue low-dose Klonopin Follow-up with cardiologist to establish and monitoring your aortic stenosis (TEE determined moderate aortic stenosis) Also need to follow-up with cardiologist regarding 30-day Holter monitor placement Small parotid mass-incidental finding, ENT follow-up in next 2 to 3 weeks     Discharge Condition: Stable    Code Status:   Code Status: Full Code   Diet recommendation: Diabetic diet   The patient was seen and examined this morning, remained stable, blood pressure elevated, home medication of HCTZ and metoprolol was changed to Norvasc and Toprol-XL.   Patient stable to be discharged to CIR.     Discharge Diagnoses:      Active Problems:   CVA (cerebral vascular accident) [facility])     History of Present Illness/ Hospital Course Barry [name] Summary:    Barry [name] is a 87 y.o. male with medical history significant for CAD, COPD, type 2 diabetes, hypertension, GERD, allergic rhinitis, and protein calorie malnutrition who presented to the ED with complaints of weakness and vertigo for quite some time now.  He cannot state an exact timeframe, but states that both of his legs were weak bilaterally this morning as he was standing on his porch.  Patient is noted to have bilateral CVA likely cardioembolic in origin.  TEE pending for a.m.  PT/OT evaluation pending with likely need for SNF.     Active Problems:   CVA (cerebral vascular accident) ([place])     Left-sided hemiparesis due to bilateral CVA -Likely cardioembolic in origin, TTE with no acute findings and  LVEF 60-65% -TEE 07/29/2021>> essentially within normal-exception of moderate aortic stenosis negative for any clots -With noted left-sided hemiparesis, ongoing--mild improvement -PT/OT -status post evaluation recommending SNF versus CIR -Neurology consulted recommended dual antiplatelet treatment  - 30-day Holter monitor -Continue Plavix and aspirin as ordered -Hemoglobin A1c 6.7% and LDL 84   Right-sided parotid mass -Incidental finding which will require ENT follow-up in outpatient setting   Aortic stenosis-moderate  -Incidental finding on TEE -Recommended continue close monitoring with cardiologist as an outpatient -Continue currently recommended medication   CKD stage IIIa -Baseline creatinine 1.1-1.3 -Continue to monitor   Hypertension -BP meds were held for permissive hypertension -Home medication HCTZ and  metoprolol--were changed -Blood pressure remained elevated Norvasc 5 mg, metoprolol XL 25 mg daily was added     CAD -Continue on Plavix, aspirin and statins as ordered     COPD with ongoing tobacco abuse -Counseled on cessation -Bronchodilators as needed   Type 2 diabetes -SSI -A1c 6.7% -Resume diabetic diet, home medication of Amaryl   GERD -Continue Protonix    10/02/21 Several issues today.  #1 patient is currently on aspirin and Plavix for secondary prevention of stroke.  The pattern of the stroke seen on MRI included multiple vascular territories most concerning for an embolic stroke.  Patient's son stated that he wore Holter monitor for 30 days that showed no evidence of atrial fibrillation.  He did have a 30 to 40% carotid artery stenosis seen.  #2 the patient is a diabetic on glimepiride 1/2 mg twice daily.  His blood sugars have been 170-200 recently.  The medicine is not fully controlling his sugars.  He also has chronic kidney disease and cardiovascular disease.  The question is how to better manage his diabetes.  #3 the patient has shown increasing  deconditioning.  He was doing well when he left the hospital intensive inpatient rehab.  He went to stay at a nursing facility for 30 days.  There he was receiving minimal physical therapy on a daily basis per their report.  As a result he has become increasingly weak and deconditioned and is now having a difficult time standing and walking.  He is here today in a wheelchair.  He lives in a two-story house and is unable to go upstairs.  His son therefore installed a stair lift so that he can live at home with his son and his son can care for him.  It is medically necessary for him to have the stair lift in order to be able to go upstairs to bed rooms.  #4 he has left shoulder pain.  It is worse when he lifts his arm above his head.  It hurts to sleep at night.  He has a positive empty can sign.  He has a positive Hawking sign.  He states the pain began after he stretched his arm to change a lawnmower blade prior to his stroke.  He has had constant pain in the shoulder and subacromial space since.  #5 he is on amlodipine and simvastatin.  His LDL was 84 in the hospital.  His goal is less than 70 given his history of stroke.  Therefore he would be better off the simvastatin and switch to something such as Crestor.  #6 there was a coincidental finding of a 1 cm mass in his right parotid gland. Past Medical History:  Diagnosis Date   AAA (abdominal aortic aneurysm) 3-09   4.7 cm   Allergy    rhinitis   Anxiety    Cancer ([place])    skin   Cataract    Depression    Diabetes mellitus    Diverticulosis    GERD (gastroesophageal reflux disease)    Hepatitis    Hx of colonic polyps 08/26/2015   Hyperlipidemia    Hypertension    Nephrolithiasis    Personal history of colonic polyps    adenomas, serrated also   PVD (peripheral vascular disease) ([place])    Tobacco abuse    Past Surgical History:  Procedure Laterality Date   COLONOSCOPY  2006, 2008, 2010, 05/27/2011   numerous adenomas - 13 in 2006, 3, 2008,  2 2012 (up to 1 cm), 4 diminutive adenomas, serrated adenomas 2012. Diverticulosis and hemorrhoids also.   EYE SURGERY     EYE SURGERY Left    Kidney stone operation     lower limb amputation, other toe 5th     percutaneous stent graft repair of infrarenal AAA  11/10   5.6 cm (T. early)   surgical excision basal cell carcinoma     TEE WITHOUT CARDIOVERSION N/A 07/29/2021   Procedure: TRANSESOPHAGEAL ECHOCARDIOGRAM (TEE);  Surgeon: Geralynn Rile, MD;  Location: [facility];  Service: Cardiovascular;  Laterality: N/A;   tympanic eardrum repair     VASECTOMY     Current Outpatient Medications on File Prior to Visit  Medication Sig Dispense Refill   acetaminophen (TYLENOL) 325 MG tablet Take 2 tablets (650 mg total) by mouth every 6 (six) hours as needed for mild pain (or Fever >/=  101).     amLODipine (NORVASC) 10 MG tablet Take 1 tablet (10 mg total) by mouth daily.     artificial tears (LACRILUBE) OINT ophthalmic ointment Place into both eyes every 4 (four) hours as needed for dry eyes.     B Complex-C (B-COMPLEX WITH VITAMIN C) tablet Take 1 tablet by mouth daily.     clonazepam (KLONOPIN) 0.125 MG disintegrating tablet Take 1 tablet (0.125 mg total) by mouth daily. 5 tablet 0   clopidogrel (PLAVIX) 75 MG tablet Take 75 mg by mouth daily.     diclofenac Sodium (VOLTAREN) 1 % GEL Apply 2 g topically 4 (four) times daily.     ezetimibe (ZETIA) 10 MG tablet TAKE 1 TABLET BY MOUTH DAILY, KEEP APPOINTMENT FOR FUTURE REFILL GENERIC EQUIVALENT FOR ZETIA 90 tablet 2   FLUoxetine (PROZAC) 20 MG capsule TAKE (1) CAPSULE BY MOUTH EACH MORNING. 90 capsule 2   glimepiride (AMARYL) 1 MG tablet Take 0.5 tablets (0.5 mg total) by mouth 2 (two) times daily.     hydrALAZINE (APRESOLINE) 100 MG tablet Take 1 tablet (100 mg total) by mouth every 8 (eight) hours.     loratadine (CLARITIN) 10 MG tablet Take 1 tablet (10 mg total) by mouth daily.     magnesium gluconate (MAGONATE) 500 MG tablet Take 0.5  tablets (250 mg total) by mouth at bedtime.     metoprolol succinate (TOPROL-XL) 25 MG 24 hr tablet Take 0.5 tablets (12.5 mg total) by mouth daily.     montelukast (SINGULAIR) 10 MG tablet Take 1 tablet (10 mg total) by mouth at bedtime. 90 tablet 1   rOPINIRole (REQUIP) 0.25 MG tablet Take 1 tablet (0.25 mg total) by mouth at bedtime.     simvastatin (ZOCOR) 40 MG tablet TAKE (1) TABLET BY MOUTH AT BEDTIME. (Patient taking differently: Take 40 mg by mouth at bedtime.) 90 tablet 0   traZODone (DESYREL) 50 MG tablet Take 1 tablet (50 mg total) by mouth at bedtime.     vitamin B-12 (CYANOCOBALAMIN) 1000 MCG tablet Take 1,000 mcg by mouth daily.     pantoprazole (PROTONIX) 40 MG tablet Take 1 tablet (40 mg total) by mouth daily. 30 tablet 0   No current facility-administered medications on file prior to visit.   Allergies  Allergen Reactions   Lisinopril-Hydrochlorothiazide Swelling    ZESTRIL   Doxycycline Rash   Social History   Socioeconomic History   Marital status: Widowed    Spouse name: Not on file   Number of children: 2   Years of education: Not on file   Highest education level: Not on file  Occupational History    Employer: RETIRED  Tobacco Use   Smoking status: Every Day    Packs/day: 1.00    Years: 60.00    Pack years: 60.00    Types: Cigarettes   Smokeless tobacco: Never   Tobacco comments:    smokes about 16-20 cigs a day as of 04/04/2013  Vaping Use   Vaping Use: Never used  Substance and Sexual Activity   Alcohol use: No   Drug use: No   Sexual activity: Yes    Partners: Female    Birth control/protection: Surgical    Comment: vasectomy  Other Topics Concern   Not on file  Social History Narrative   HSG, 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 retired 45 years - keeping busy. Lives alone. 1- step-daughter, 1 son - '68; 3 grandchildren. does odd-jobs, yard work   Investment banker, operational of  Health   Financial Resource Strain: Not on file  Food Insecurity:  Not on file  Transportation Needs: Not on file  Physical Activity: Not on file  Stress: Not on file  Social Connections: Not on file  Intimate Partner Violence: Not on file     Review of Systems  All other systems reviewed and are negative.     Objective:   Physical Exam Vitals reviewed.  Constitutional:      General: He is not in acute distress.    Appearance: He is not toxic-appearing.  HENT:     Right Ear: Tympanic membrane and ear canal normal.     Left Ear: Tympanic membrane is injected and scarred.  Cardiovascular:     Rate and Rhythm: Normal rate.     Heart sounds: Murmur heard.  Pulmonary:     Effort: Pulmonary effort is normal.     Breath sounds: Normal breath sounds. No wheezing.  Musculoskeletal:     Left shoulder: Tenderness and crepitus present. Decreased range of motion. Decreased strength.     Right lower leg: No edema.     Left lower leg: No edema.  Neurological:     General: No focal deficit present.     Mental Status: He is alert and oriented to person, place, and time.     Cranial Nerves: No cranial nerve deficit.     Sensory: No sensory deficit.     Coordination: Coordination normal.     Gait: Gait abnormal.     Deep Tendon Reflexes: Reflexes normal.          Assessment & Plan:  Mass of parotid gland - Plan: Ambulatory referral to ENT  Cerebrovascular accident (CVA) due to embolism of cerebral artery ([place])  Pure hypercholesterolemia  Controlled type 2 diabetes mellitus without complication, without long-term current use of insulin (HCC)  Muscular deconditioning  Acute pain of left shoulder #1 we will continue aspirin and Plavix for secondary prevention of stroke.  Holter monitor was performed to rule out atrial fibrillation.  We discussed a referral to a cardiologist for an implantable loop recorder.  At the present time I do not feel that is necessary.  2.  We will discontinue glimepiride and replace with Farxiga 10 mg a day to better  manage his sugars and reduce his cardiovascular risk and provide renal protection.   #3 I will consult physical therapy regarding deconditioning to try to improve his mobility at home.    #4 I will switch simvastatin to Crestor 20 mg a day to reduce the risk of muscle pain/myositis and improve his LDL cholesterol likely given the fact that he is on amlodipine.    #5, I injected his left shoulder with 2 cc lidocaine, 2 cc of Marcaine, 2 cc of 40 mg/mL Kenalog.  He tolerated the procedure well.  I believe he may have a partial tear in his rotator cuff or at the very least subacromial bursitis.    #6 I will consult ENT regarding the mass seen on CT scan.

## 2021-10-02 NOTE — Patient Instructions (Addendum)
Visit Information   Goals Addressed             This Visit's Progress    Track and Manage My Blood Pressure-Hypertension   On track    Timeframe:  Long-Range Goal Priority:  High Start Date:   01/01/21                          Expected End Date:    07/04/21                   Follow Up Date 04/16/21   - check blood pressure 3 times per week - write blood pressure results in a log or diary    Why is this important?   You won't feel high blood pressure, but it can still hurt your blood vessels.  High blood pressure can cause heart or kidney problems. It can also cause a stroke.  Making lifestyle changes like losing a little weight or eating less salt will help.  Checking your blood pressure at home and at different times of the day can help to control blood pressure.  If the doctor prescribes medicine remember to take it the way the doctor ordered.  Call the office if you cannot afford the medicine or if there are questions about it.     Notes:        Patient Care Plan: General Pharmacy (Adult)     Problem Identified: HTN, HLD, Depression/Anxiety, GERD, Type II DM   Priority: High  Onset Date: 01/01/2021     Long-Range Goal: Patient-Specific Goal   Start Date: 01/01/2021  Expected End Date: 07/04/2021  Recent Progress: On track  Priority: High  Note:   Current Barriers:  Unable to independently monitor therapeutic efficacy Unable to maintain control of blood pressure Possible subtherapeutic regimen for DM LDL above goal  Pharmacist Clinical Goal(s):  Over the next 180 days, patient will achieve adherence to monitoring guidelines and medication adherence to achieve therapeutic efficacy achieve control of blood pressure as evidenced by home monitoring maintain control of blood sugar as evidenced by A1c/glucose   adhere to prescribed medication regimen as evidenced by fill dates through collaboration with PharmD and provider.   Interventions: 1:1 collaboration with  Buelah Manis, Modena Nunnery, MD regarding development and update of comprehensive plan of care as evidenced by provider attestation and co-signature Inter-disciplinary care team collaboration (see longitudinal plan of care) Comprehensive medication review performed; medication list updated in electronic medical record  Hypertension (BP goal <130/80) -Not ideally controlled -Current treatment: Metoprolol XL 12.5mg  Amlodipine 10mg  daily Hydralazine 100mg  q8h -Medications previously tried: lisinopril/HCTZ (swelling), Nifedipine (pt d/c on his own)  -Current home readings: unavailable, pt not really checking -Denies hypotensive/hypertensive symptoms -Educated on BP goals and benefits of medications for prevention of heart attack, stroke and kidney damage; Importance of home blood pressure monitoring; Symptoms of hypotension and importance of maintaining adequate hydration; -Counseled to monitor BP at home at least a few times per week, document, and provide log at future appointments -He does not monitor often due to getting worked up over it -He stopped nifedipine on his own and his last office blood pressure was WNL.  He reports he was not taking it at this time. -Counseled him on need for home monitoring to adjust medications or monitor for increased/decreased BP/ -Recommended to continue current medication Recommended follow up with cardiology on nifedipine.  Update 10/01/21 BP @ home?? - they have not been checking  BP at home, but they did just get a cuff. Counseled on appropriate blood pressure goals. Previously on ramipril and HCTZ so I have asked that they monitor closely for 2 weeks and we will check back to determine if anything else needs to be added back.  In the mean time watch salt in diet, contact me if they are noticing consistent readings > 140/90 before the two week period. No changes to medications at this time.   Hyperlipidemia/CAD: (LDL goal < 70) -Controlled -Current  treatment: Simvastatin 40mg  Ezetimibe 10mg  -Medications previously tried: none noted -Educated on Cholesterol goals;  Benefits of statin for ASCVD risk reduction;  -Continues to be adherent' -Most recent lipid panel excellent, congratulations on this! -Recommended to continue current medication  Update 10/01/21 LDL in hospital was 84 - goal < 70. Patient is hight risk due to previous stroke. Currently on moderate intensity statin.  There is also the potential of increased myopathy and rhabdomyolysis with amlodipine and simvastatin doses over 20mg .  Based on that I would consider a switch to Crestor 20mg  daily for better LDL lowering and no interaction with amlodipine.  Diabetes (A1c goal <7%) -Controlled -Current medications: Glimepiride 1mg  - half tablet daily -Medications previously tried: metformin (headaches)  -Current home glucose readings fasting glucose: 100-115 most mornings post prandial glucose: none available -Denies hypoglycemic/hyperglycemic symptoms -Current meal patterns: reports a good balanced diet -Exercise - active with yard work for himself and neighbors -Educated on A1c and blood sugar goals; Benefits of routine self-monitoring of blood sugar; -Counseled to check feet daily and get yearly eye exams -Denies hypoglycemia since decreased dose of glimepiride, continue to monitor -Recommended to continue current medication  Update 10/01/21 170-227 have been the range of sugars since coming home from rehab, no episodes of hypoglycemia. Monitoring sugars at home? 220s mid day today D/c Jardiance? - He does not remember why he stopped this, there was also a mention of a trial of Farxiga, however he reports that cost was the barrier in him taking this. Would he consider Jardiance/Farxiga  if price was not an issue? Yes the only barrier he can remember is cost. We spent a lot of time reviewing diet and portion control.  Counseled on limiting carbs to one source per  meal.   For cardiovascular and renal benefits, I would consider a switch to [place] 5mg  daily and d/c glimepiride.  Work on carbohydrate servings and excess sugars.   Future plans to introduce low dose metformin if glucose still not at goal. Of course this is only if we can get him approved for patient assistance program. FU in 2 weeks on glucose readings and PAP application.  Depression/Anxiety (Goal: Control symptoms) -Controlled -Current treatment: Fluoxetine 20mg  Clonazepam 0.125mg  prn -Medications previously tried/failed: none noted -Educated on Benefits of medication for symptom control  -Pt reports his mood has been great lately, no issues with sleep -Recommended to continue current medication  Update 10/01/21 Mentions vivid dreams at night, the son can hear him tossing and turning all night.  Mr. Vantrease does not realize he has not slept well the next morning. Started ropinirole recently in hospital due to restless legs.  This can often cause vivid dreams and sleep disturbance especially at the beginning of treatment.  Have asked his son to trial him off of this for a few nights to see if this resolves the issue. Has FU with PCP tomorrow 12/16 - will discuss at that appt.  GERD (Goal: Minimize symptoms) -Controlled -Current treatment  Famotidine 20mg  prn -Medications previously tried: none noted -No longer taking famotidine scheduled, just takes prn -Reports his symptoms are very rare -Recommended to continue current medication   Patient Goals/Self-Care Activities Over the next 180 days, patient will:  - take medications as prescribed check glucose daily, document, and provide at future appointments check blood pressure at least a few times per week, document, and provide at future appointments  Follow Up Plan: The care management team will reach out to the patient again over the next 90 days.            Patient verbalizes understanding of instructions provided  today and agrees to view in [place].  Telephone follow up appointment with pharmacy team member scheduled for: 3 months  Edythe Clarity, [place], PharmD, [place] Clinical Pharmacist Practitioner [place] [phone]

## 2021-10-05 ENCOUNTER — Emergency Department (HOSPITAL_COMMUNITY): Payer: Medicare Other

## 2021-10-05 ENCOUNTER — Emergency Department (HOSPITAL_COMMUNITY)
Admission: EM | Admit: 2021-10-05 | Discharge: 2021-10-05 | Disposition: A | Discharge status: Home / Self Care | Payer: Medicare Other | Attending: Emergency Medicine | Admitting: Emergency Medicine

## 2021-10-05 ENCOUNTER — Encounter (HOSPITAL_COMMUNITY): Payer: Self-pay | Admitting: Emergency Medicine

## 2021-10-05 ENCOUNTER — Telehealth: Payer: Self-pay

## 2021-10-05 ENCOUNTER — Other Ambulatory Visit: Payer: Self-pay

## 2021-10-05 DIAGNOSIS — Z79899 Other long term (current) drug therapy: Secondary | ICD-10-CM | POA: Diagnosis not present

## 2021-10-05 DIAGNOSIS — I129 Hypertensive chronic kidney disease with stage 1 through stage 4 chronic kidney disease, or unspecified chronic kidney disease: Secondary | ICD-10-CM | POA: Diagnosis not present

## 2021-10-05 DIAGNOSIS — J449 Chronic obstructive pulmonary disease, unspecified: Secondary | ICD-10-CM | POA: Insufficient documentation

## 2021-10-05 DIAGNOSIS — I10 Essential (primary) hypertension: Secondary | ICD-10-CM | POA: Diagnosis not present

## 2021-10-05 DIAGNOSIS — R030 Elevated blood-pressure reading, without diagnosis of hypertension: Secondary | ICD-10-CM | POA: Diagnosis present

## 2021-10-05 DIAGNOSIS — Z7902 Long term (current) use of antithrombotics/antiplatelets: Secondary | ICD-10-CM | POA: Diagnosis not present

## 2021-10-05 DIAGNOSIS — E1122 Type 2 diabetes mellitus with diabetic chronic kidney disease: Secondary | ICD-10-CM | POA: Insufficient documentation

## 2021-10-05 DIAGNOSIS — I2510 Atherosclerotic heart disease of native coronary artery without angina pectoris: Secondary | ICD-10-CM | POA: Diagnosis not present

## 2021-10-05 DIAGNOSIS — Z85828 Personal history of other malignant neoplasm of skin: Secondary | ICD-10-CM | POA: Insufficient documentation

## 2021-10-05 DIAGNOSIS — F17210 Nicotine dependence, cigarettes, uncomplicated: Secondary | ICD-10-CM | POA: Diagnosis not present

## 2021-10-05 DIAGNOSIS — Z7984 Long term (current) use of oral hypoglycemic drugs: Secondary | ICD-10-CM | POA: Diagnosis not present

## 2021-10-05 DIAGNOSIS — Z7951 Long term (current) use of inhaled steroids: Secondary | ICD-10-CM | POA: Diagnosis not present

## 2021-10-05 DIAGNOSIS — Z20822 Contact with and (suspected) exposure to covid-19: Secondary | ICD-10-CM | POA: Diagnosis not present

## 2021-10-05 DIAGNOSIS — N1831 Chronic kidney disease, stage 3a: Secondary | ICD-10-CM | POA: Diagnosis not present

## 2021-10-05 DIAGNOSIS — G459 Transient cerebral ischemic attack, unspecified: Secondary | ICD-10-CM | POA: Diagnosis not present

## 2021-10-05 DIAGNOSIS — R739 Hyperglycemia, unspecified: Secondary | ICD-10-CM | POA: Diagnosis not present

## 2021-10-05 DIAGNOSIS — R4182 Altered mental status, unspecified: Secondary | ICD-10-CM | POA: Insufficient documentation

## 2021-10-05 DIAGNOSIS — I69354 Hemiplegia and hemiparesis following cerebral infarction affecting left non-dominant side: Secondary | ICD-10-CM | POA: Diagnosis not present

## 2021-10-05 DIAGNOSIS — K219 Gastro-esophageal reflux disease without esophagitis: Secondary | ICD-10-CM | POA: Diagnosis not present

## 2021-10-05 DIAGNOSIS — N183 Chronic kidney disease, stage 3 unspecified: Secondary | ICD-10-CM | POA: Diagnosis not present

## 2021-10-05 DIAGNOSIS — E1151 Type 2 diabetes mellitus with diabetic peripheral angiopathy without gangrene: Secondary | ICD-10-CM | POA: Diagnosis not present

## 2021-10-05 LAB — BASIC METABOLIC PANEL
Anion gap: 9 (ref 5–15)
BUN: 34 mg/dL — ABNORMAL HIGH (ref 8–23)
CO2: 24 mmol/L (ref 22–32)
Calcium: 9.3 mg/dL (ref 8.9–10.3)
Chloride: 103 mmol/L (ref 98–111)
Creatinine, Ser: 1.16 mg/dL (ref 0.61–1.24)
GFR, Estimated: >60 mL/min (ref >60)
Glucose, Bld: 256 mg/dL — ABNORMAL HIGH (ref 70–99)
Potassium: 4.4 mmol/L (ref 3.5–5.1)
Sodium: 136 mmol/L (ref 135–145)

## 2021-10-05 LAB — CBC WITH DIFFERENTIAL/PLATELET
Abs Immature Granulocytes: 0.08 10*3/uL — ABNORMAL HIGH (ref 0.00–0.07)
Basophils Absolute: 0 10*3/uL (ref 0.0–0.1)
Basophils Relative: 0 %
Eosinophils Absolute: 0 10*3/uL (ref 0.0–0.5)
Eosinophils Relative: 0 %
HCT: 42.8 % (ref 39.0–52.0)
Hemoglobin: 13.8 g/dL (ref 13.0–17.0)
Immature Granulocytes: 1 %
Lymphocytes Relative: 19 %
Lymphs Abs: 1.7 10*3/uL (ref 0.7–4.0)
MCH: 30.7 pg (ref 26.0–34.0)
MCHC: 32.2 g/dL (ref 30.0–36.0)
MCV: 95.1 fL (ref 80.0–100.0)
Monocytes Absolute: 0.9 10*3/uL (ref 0.1–1.0)
Monocytes Relative: 10 %
Neutro Abs: 6.4 10*3/uL (ref 1.7–7.7)
Neutrophils Relative %: 70 %
Platelets: 302 10*3/uL (ref 150–400)
RBC: 4.5 MIL/uL (ref 4.22–5.81)
RDW: 13.2 % (ref 11.5–15.5)
WBC: 9.2 10*3/uL (ref 4.0–10.5)
nRBC: 0 % (ref 0.0–0.2)

## 2021-10-05 LAB — URINALYSIS, ROUTINE W REFLEX MICROSCOPIC
Bilirubin Urine: NEGATIVE
Glucose, UA: >=500 mg/dL — AB
Hgb urine dipstick: NEGATIVE
Ketones, ur: NEGATIVE mg/dL
Leukocytes,Ua: NEGATIVE
Nitrite: NEGATIVE
Protein, ur: 100 mg/dL — AB
Specific Gravity, Urine: 1.02 (ref 1.005–1.030)
pH: 6.5 (ref 5.0–8.0)

## 2021-10-05 LAB — URINALYSIS, MICROSCOPIC (REFLEX): Bacteria, UA: NONE SEEN

## 2021-10-05 LAB — RESP PANEL BY RT-PCR (FLU A&B, COVID) ARPGX2
Influenza A by PCR: NEGATIVE
Influenza B by PCR: NEGATIVE
SARS Coronavirus 2 by RT PCR: NEGATIVE

## 2021-10-05 NOTE — ED Provider Notes (Signed)
Emergency Medicine Provider Triage Evaluation Note  Barry [name] , a 87 y.o. male  was evaluated in triage.  Pt complains of sent in for HTN. Home health  nurse has noted that his blood pressure has slightly elevated upward.  Patient blood pressure was 180/90 per fire.  Currently systolic pressure of 017.  Patient has been feeling lightheaded.  10-week status post CVA.  Review of Systems  Positive: htn Negative: Negative for cp / sob  Physical Exam  There were no vitals taken for this visit. Gen:   Awake, no distress   Resp:  Normal effort  MSK:   Moves extremities without difficulty  Other:  Chronic L sided weakness  Medical Decision Making  Medically screening exam initiated at 12:58 PM.  Appropriate orders placed.  Daryle Amis [facility] was informed that the remainder of the evaluation will be completed by another provider, this initial triage assessment does not replace that evaluation, and the importance of remaining in the ED until their evaluation is complete.  Work up [employer], [place], PA-C 10/05/21 1300    Regan Lemming, MD 10/05/21 1317

## 2021-10-05 NOTE — ED Provider Notes (Signed)
[facility] EMERGENCY DEPARTMENT Provider Note   CSN: [id number] Arrival date & time: 10/05/21  1254     History Chief Complaint  Patient presents with   Hypertension    Barry [name] is a 87 y.o. male.  Pt presents to the ED today with high blood pressure.  Pt's home health nurse came by today and bp was elevated today with sbp 180.  Pt's son said he's been under a lot of stress with a daughter who is terminally ill and in the hospital.  Pt's son said he has been confused this week.  Pt denies any complaints.      Past Medical History:  Diagnosis Date   AAA (abdominal aortic aneurysm) 3-09   4.7 cm   Allergy    rhinitis   Anxiety    Cancer ([place])    skin   Cataract    Depression    Diabetes mellitus    Diverticulosis    GERD (gastroesophageal reflux disease)    Hepatitis    Hx of colonic polyps 08/26/2015   Hyperlipidemia    Hypertension    Nephrolithiasis    Personal history of colonic polyps    adenomas, serrated also   PVD (peripheral vascular disease) ([place])    Tobacco abuse     Patient Active Problem List   Diagnosis Date Noted   Right thalamic infarction ([place]) 07/31/2021   Stage 3a chronic kidney disease ([place])    Benign essential HTN    Left hemiparesis ([place])    Type 2 diabetes mellitus with hyperglycemia, without long-term current use of insulin (HCC)    CVA (cerebral vascular accident) ([place]) 07/27/2021   Frequent PVCs 03/20/2020   Aortic stenosis 03/20/2020   COPD (chronic obstructive pulmonary disease) ([place]) 03/20/2020   Constipation 04/30/2019   Protein-calorie malnutrition ([place]) 04/30/2019   Anxiety with depression 12/05/2018   Seasonal and perennial allergic rhinitis 08/09/2018   Carotid artery disease ([place]) 11/18/2017   Hx of colonic polyps 08/26/2015   Erectile dysfunction 02/17/2015   AAA (abdominal aortic aneurysm) without rupture ([place]) 05/21/2014   Chest pain 01/31/2014   CAD (coronary artery disease) 12/24/2013    HEARING LOSS, CONDUCTIVE, LEFT 02/23/2010   DIZZINESS, CHRONIC 05/28/2008   PULMONARY NODULE 09/07/2007   Diabetes mellitus type II, controlled ([place]) 09/06/2007   Hyperlipidemia 09/06/2007   TOBACCO ABUSE 09/06/2007   Essential hypertension 09/06/2007   Allergic rhinitis 09/06/2007   GERD 09/06/2007   BASAL CELL CARCINOMA, NOSE 09/06/2007    Past Surgical History:  Procedure Laterality Date   COLONOSCOPY  2006, 2008, 2010, 05/27/2011   numerous adenomas - 13 in 2006, 3, 2008, 2 2012 (up to 1 cm), 4 diminutive adenomas, serrated adenomas 2012. Diverticulosis and hemorrhoids also.   EYE SURGERY     EYE SURGERY Left    Kidney stone operation     lower limb amputation, other toe 5th     percutaneous stent graft repair of infrarenal AAA  11/10   5.6 cm (T. early)   surgical excision basal cell carcinoma     TEE WITHOUT CARDIOVERSION N/A 07/29/2021   Procedure: TRANSESOPHAGEAL ECHOCARDIOGRAM (TEE);  Surgeon: Geralynn Rile, MD;  Location: [facility];  Service: Cardiovascular;  Laterality: N/A;   tympanic eardrum repair     VASECTOMY         Family History  Problem Relation Age of Onset   Emphysema Father    Cancer Brother        Lung  Depression Brother    Depression Sister    Lung disease Sister        Fibrosis and died from pneumonia on 24-Mar-2013   Kidney disease Sister    Diabetes Sister    Learning disabilities Sister    Mental retardation Sister    Heart disease Mother    Cancer Brother    Colon cancer Neg Hx    Dementia Neg Hx    Alcohol abuse Neg Hx    Drug abuse Neg Hx    Paranoid behavior Neg Hx    Schizophrenia Neg Hx    Anxiety disorder Neg Hx    Bipolar disorder Neg Hx    OCD Neg Hx    Sexual abuse Neg Hx    Physical abuse Neg Hx    Seizures Neg Hx    Esophageal cancer Neg Hx    Stomach cancer Neg Hx    Rectal cancer Neg Hx     Social History   Tobacco Use   Smoking status: Every Day    Packs/day: 1.00    Years: 60.00    Pack years: 60.00     Types: Cigarettes   Smokeless tobacco: Never   Tobacco comments:    smokes about 16-20 cigs a day as of 04/04/2013  Vaping Use   Vaping Use: Never used  Substance Use Topics   Alcohol use: No   Drug use: No    Home Medications Prior to Admission medications   Medication Sig Start Date End Date Taking? Authorizing Provider  acetaminophen (TYLENOL) 325 MG tablet Take 2 tablets (650 mg total) by mouth every 6 (six) hours as needed for mild pain (or Fever >/= 101). 08/24/21   Angiulli, Lavon Paganini, PA-C  amLODipine (NORVASC) 10 MG tablet Take 1 tablet (10 mg total) by mouth daily. 08/25/21   Angiulli, Lavon Paganini, PA-C  artificial tears (LACRILUBE) OINT ophthalmic ointment Place into both eyes every 4 (four) hours as needed for dry eyes. 08/24/21   Angiulli, Lavon Paganini, PA-C  B Complex-C (B-COMPLEX WITH VITAMIN C) tablet Take 1 tablet by mouth daily. 08/25/21   Angiulli, Lavon Paganini, PA-C  clonazepam (KLONOPIN) 0.125 MG disintegrating tablet Take 1 tablet (0.125 mg total) by mouth daily. 08/25/21   Angiulli, Lavon Paganini, PA-C  clopidogrel (PLAVIX) 75 MG tablet Take 75 mg by mouth daily.    [provider]  diclofenac Sodium (VOLTAREN) 1 % GEL Apply 2 g topically 4 (four) times daily. 08/24/21   Angiulli, Lavon Paganini, PA-C  ezetimibe (ZETIA) 10 MG tablet TAKE 1 TABLET BY MOUTH DAILY, KEEP APPOINTMENT FOR FUTURE REFILL GENERIC EQUIVALENT FOR ZETIA 06/29/21   Lelon Perla, MD  FLUoxetine (PROZAC) 20 MG capsule TAKE (1) CAPSULE BY MOUTH EACH MORNING. 04/02/21   Cloria Spring, MD  fluticasone [facility]) 50 MCG/ACT nasal spray Place 2 sprays into both nostrils daily. 10/02/21   Susy Frizzle, MD  fluticasone (FLONASE) 50 MCG/ACT nasal spray Place 2 sprays into both nostrils daily. 10/02/21   Susy Frizzle, MD  glimepiride (AMARYL) 1 MG tablet Take 0.5 tablets (0.5 mg total) by mouth 2 (two) times daily. 08/24/21   Angiulli, Lavon Paganini, PA-C  hydrALAZINE (APRESOLINE) 100 MG tablet Take 1 tablet (100 mg  total) by mouth every 8 (eight) hours. 08/24/21   Angiulli, Lavon Paganini, PA-C  loratadine (CLARITIN) 10 MG tablet Take 1 tablet (10 mg total) by mouth daily. 08/25/21   Angiulli, Lavon Paganini, PA-C  magnesium gluconate (MAGONATE) 500 MG tablet  Take 0.5 tablets (250 mg total) by mouth at bedtime. 08/24/21   Angiulli, Lavon Paganini, PA-C  metoprolol succinate (TOPROL-XL) 25 MG 24 hr tablet Take 0.5 tablets (12.5 mg total) by mouth daily. 08/25/21   Angiulli, Lavon Paganini, PA-C  montelukast (SINGULAIR) 10 MG tablet Take 1 tablet (10 mg total) by mouth at bedtime. 12/15/20   Alycia Rossetti, MD  pantoprazole (PROTONIX) 40 MG tablet Take 1 tablet (40 mg total) by mouth daily. 07/31/21 08/30/21  Shahmehdi, Valeria Batman, MD  rOPINIRole (REQUIP) 0.25 MG tablet Take 1 tablet (0.25 mg total) by mouth at bedtime. 08/25/21   Angiulli, Lavon Paganini, PA-C  rosuvastatin (CRESTOR) 20 MG tablet Take 1 tablet (20 mg total) by mouth daily. 10/02/21   Susy Frizzle, MD  simvastatin (ZOCOR) 40 MG tablet TAKE (1) TABLET BY MOUTH AT BEDTIME. Patient taking differently: Take 40 mg by mouth at bedtime. 05/12/21   Susy Frizzle, MD  traZODone (DESYREL) 50 MG tablet Take 1 tablet (50 mg total) by mouth at bedtime. 08/24/21   Angiulli, Lavon Paganini, PA-C  vitamin B-12 (CYANOCOBALAMIN) 1000 MCG tablet Take 1,000 mcg by mouth daily.    [provider]    Allergies    Lisinopril-hydrochlorothiazide and Doxycycline  Review of Systems   Review of Systems  Neurological:  Positive for weakness.  All other systems reviewed and are negative.  Physical Exam Updated Vital Signs BP (!) 163/91    Pulse (!) 56    Temp 97.8 F (36.6 C)    Resp (!) 26    Ht 5\' 11"  (1.803 m)    Wt 77.6 kg    SpO2 98%    BMI 23.85 kg/m   Physical Exam Vitals and nursing note reviewed.  Constitutional:      Appearance: Normal appearance.  HENT:     Head: Normocephalic and atraumatic.     Right Ear: External ear normal.     Left Ear: External ear normal.      Nose: Nose normal.     Mouth/Throat:     Mouth: Mucous membranes are moist.     Pharynx: Oropharynx is clear.  Eyes:     Extraocular Movements: Extraocular movements intact.     Conjunctiva/sclera: Conjunctivae normal.     Pupils: Pupils are equal, round, and reactive to light.  Cardiovascular:     Rate and Rhythm: Normal rate and regular rhythm.     Pulses: Normal pulses.     Heart sounds: Normal heart sounds.  Pulmonary:     Effort: Pulmonary effort is normal.     Breath sounds: Normal breath sounds.  Abdominal:     General: Abdomen is flat. Bowel sounds are normal.     Palpations: Abdomen is soft.  Musculoskeletal:        General: Normal range of motion.     Cervical back: Normal range of motion and neck supple.  Skin:    General: Skin is warm.     Capillary Refill: Capillary refill takes less than 2 seconds.  Neurological:     Mental Status: He is alert.     Comments: Left sided weakness from stroke in October.    ED Results / Procedures / Treatments   Labs (all labs ordered are listed, but only abnormal results are displayed) Labs Reviewed  BASIC METABOLIC PANEL - Abnormal; Notable for the following components:      Result Value   Glucose, Bld 256 (*)    BUN 34 (*)  All other components within normal limits  CBC WITH DIFFERENTIAL/PLATELET - Abnormal; Notable for the following components:   Abs Immature Granulocytes 0.08 (*)    All other components within normal limits  URINALYSIS, ROUTINE W REFLEX MICROSCOPIC - Abnormal; Notable for the following components:   Glucose, UA >=500 (*)    Protein, ur 100 (*)    All other components within normal limits  RESP PANEL BY RT-PCR (FLU A&B, COVID) ARPGX2  URINALYSIS, MICROSCOPIC (REFLEX)    EKG EKG Interpretation  Date/Time:  Monday October 05 2021 13:31:43 EST Ventricular Rate:  58 PR Interval:  150 QRS Duration: 84 QT Interval:  404 QTC Calculation: 396 R Axis:   42 Text Interpretation: Sinus bradycardia  Cannot rule out Anterior infarct , age undetermined Abnormal ECG No significant change since last tracing Confirmed by Isla Pence [phone]) on 10/05/2021 4:39:26 PM  Radiology CT Head Wo Contrast  Result Date: 10/05/2021 CLINICAL DATA:  Altered mental status. EXAM: CT HEAD WITHOUT CONTRAST TECHNIQUE: Contiguous axial images were obtained from the base of the skull through the vertex without intravenous contrast. COMPARISON:  Head CT dated 10/27/2018. FINDINGS: Brain: Moderate age-related atrophy and chronic microvascular ischemic changes. Bilateral basal ganglia hypodensities, new since the prior CT but age indeterminate. Clinical correlation is recommended. If there is clinical concern for acute ischemia further evaluation with MRI is recommended. There is no acute intracranial hemorrhage. No mass effect midline shift. No extra-axial fluid collection. Vascular: No hyperdense vessel or unexpected calcification. Skull: Normal. Negative for fracture or focal lesion. Sinuses/Orbits: No acute finding. Other: None IMPRESSION: 1. No acute intracranial pathology. 2. Moderate age-related atrophy and chronic microvascular ischemic changes. Bilateral basal ganglia hypodensities, new since the prior CT but age indeterminate. If there is clinical concern for acute ischemia further evaluation with MRI is recommended. Electronically Signed   By: Anner Crete M.D.   On: 10/05/2021 18:08   DG Chest Portable 1 View  Result Date: 10/05/2021 CLINICAL DATA:  Altered mental status EXAM: PORTABLE CHEST 1 VIEW COMPARISON:  02/22/2014 FINDINGS: Cardiac shadow is enlarged. Aortic calcifications are noted. Lungs are well aerated bilaterally. Mild vascular congestion is noted without interstitial edema. No focal infiltrate is seen. No bony abnormality is noted. IMPRESSION: Mild vascular congestion without edema. Electronically Signed   By: Inez Catalina M.D.   On: 10/05/2021 17:30    Procedures Procedures   Medications  Ordered in ED Medications - No data to display  ED Course  I have reviewed the triage vital signs and the nursing notes.  Pertinent labs & imaging results that were available during my care of the patient were reviewed by me and considered in my medical decision making (see chart for details).    MDM Rules/Calculators/A&P                         Labs with elevation in blood sugar.  Otherwise, they are nl.  BP is in the 160s.  Pt is asymptomatic.  The covid/flu machine is down.  Swabs will have a several hour delay.  Pt and son will check "my chart" tomorrow for results.  Pt is stable for d/c.  Return if worse.  F/u with pcp.  Final Clinical Impression(s) / ED Diagnoses Final diagnoses:  Hypertension, unspecified type    Rx / DC Orders ED Discharge Orders     None        Isla Pence, MD 10/05/21 2105

## 2021-10-05 NOTE — ED Triage Notes (Addendum)
Patient BIB [facility] from home for evaluation of hypertension over the last week by his home health RN. EMS reports BP of 160/72, HR 60.  Patient also reports intermittent lightheadedness. Patient alert, oriented, and in no apparent distress at this time.  History of stroke 10 weeks ago with residual left sided weakness.  CBG 284

## 2021-10-05 NOTE — Telephone Encounter (Signed)
[facility] nurse called concerned about patients vitals.  Blood pressure is 170/90, pulse 62.  Patient complains of light headedness.  Due to recent stroke history advised RN to send patient to hospital.

## 2021-10-06 ENCOUNTER — Telehealth: Payer: Self-pay

## 2021-10-06 ENCOUNTER — Telehealth: Payer: Self-pay | Admitting: Pharmacist

## 2021-10-06 DIAGNOSIS — I2510 Atherosclerotic heart disease of native coronary artery without angina pectoris: Secondary | ICD-10-CM

## 2021-10-06 MED ORDER — CLOPIDOGREL BISULFATE 75 MG PO TABS: 75.0000 mg | ORAL_TABLET | Freq: Every day | ORAL | 3 refills | Status: DC

## 2021-10-06 MED ORDER — METOPROLOL SUCCINATE ER 25 MG PO TB24: 12.5000 mg | ORAL_TABLET | Freq: Every day | ORAL | 3 refills | Status: DC

## 2021-10-06 MED ORDER — EZETIMIBE 10 MG PO TABS: ORAL_TABLET | ORAL | 3 refills | Status: DC

## 2021-10-06 NOTE — Progress Notes (Signed)
° ° °  Chronic Care Management Pharmacy Assistant    Name: Barry [name]  MRN: [id number] DOB: Mar 23, 1933    Reason for Encounter: PAP for Farxiga  PAP form initiated for Barry [name]. Will ne  mailed to patient to complete patient portion and return to prescribing MD office for final portion to be completed by MD and faxed in for patient for processing. Patient will update on the outcome of her application once received.    Jobe Gibbon, [facility] Clinical Pharmacist Assistant  [phone]

## 2021-10-06 NOTE — Telephone Encounter (Signed)
Clopidogrel, metoprolol, and zetia for sure.  Flonase for allergies only if needed.

## 2021-10-06 NOTE — Telephone Encounter (Signed)
Some of these medications are historical.  Please advise what patient should be taking and if refills are appropriate.

## 2021-10-06 NOTE — Telephone Encounter (Signed)
Pt's son called in stating that pt is out of these meds since being d/c'd from rehab.  fluticasone (FLONASE) 50 MCG/ACT  ezetimibe (ZETIA) 10 MG tablet  traZODone (DESYREL) 50 MG tablet  montelukast (SINGULAIR) 10 MG   metoprolol succinate (TOPROL-XL) 25 MG 24 hr tablet  B Complex-C (B-COMPLEX WITH VITAMIN C)  clopidogrel (PLAVIX) 75 MG tablet   Cb#: [phone]

## 2021-10-06 NOTE — Telephone Encounter (Signed)
Refills sent.  Patient son advised.  Should patient continue hydralazine?

## 2021-10-07 MED ORDER — HYDRALAZINE HCL 100 MG PO TABS: 100.0000 mg | ORAL_TABLET | Freq: Three times a day (TID) | ORAL | 0 refills | Status: DC

## 2021-10-07 NOTE — Telephone Encounter (Signed)
Advised patient son that patient should continue hydralazine.  Refill sent to pharmacy per Dr Dennard Schaumann.

## 2021-10-07 NOTE — Addendum Note (Signed)
Addended by: Amado Coe on: 10/07/2021 09:22 AM   Modules accepted: Orders

## 2021-10-08 ENCOUNTER — Telehealth: Payer: Self-pay | Admitting: Pharmacist

## 2021-10-08 NOTE — Progress Notes (Signed)
Chronic Care Management Pharmacy Assistant   Name: Barry [name]  MRN: [id number] DOB: [birth year]   Reason for Encounter: Disease State - Hypertension Call    Recent office visits:  10/02/2021 Jenna Luo, MD (PCP) - Family Medicine - Hypertension - Referral to ENT and Physical Therapy placed. fluticasone (FLONASE) 50 MCG/ACT nasal spray and rosuvastatin (CRESTOR) 20 MG tablet prescribed. Discontinue glimepiride and replace with Farxiga 10 mg a day to better manage his sugars. Simvastatin was discontinued and replaced with Crestor. Left Shoulder joint injection administered in office. Follow up as scheduled.  Recent consult visits:  None noted.   Hospital visits: 10/05/2021  Medication Reconciliation was completed by comparing discharge summary, patients EMR and Pharmacy list, and upon discussion with patient.  Admitted to the hospital on 10/05/2021 due to Hypertension. Discharge date was 10/06/2021. Discharged from [place]?Medications Started at [facility] Discharge:?? None noted.   Medication Changes at Hospital Discharge: None noted.   Medications Discontinued at Hospital Discharge: None noted.  Medications that remain the same after Hospital Discharge:??  All other medications will remain the same.    Hospital visits: 07/31/2021  Medication Reconciliation was completed by comparing discharge summary, patients EMR and Pharmacy list, and upon discussion with patient.  Admitted to the hospital on 07/31/2021 due to Right Thalamic infarction. Discharge date was 08/26/2021. Discharged from [place]?Medications Started at [facility] Discharge:?? acetaminophen (TYLENOL) artificial tears (LACRILUBE) B-complex with vitamin C diclofenac Sodium (VOLTAREN) hydrALAZINE (APRESOLINE) loratadine (CLARITIN) magnesium gluconate (MAGONATE) rOPINIRole (REQUIP) traZODone (DESYREL)  Medication Changes at Hospital Discharge: amLODipine  (NORVASC) clonazepam (KLONOPIN) metoprolol succinate (TOPROL-XL)  Medications Discontinued at Hospital Discharge: aspirin EC 81 MG tablet cetirizine 10 MG tablet (ZYRTEC) insulin aspart 100 UNIT/ML injection (novoLOG) pregabalin 50 MG capsule (LYRICA)  Medications that remain the same after Hospital Discharge:??  All other medications will remain the same.    Hospital visits: 07/27/2021 - 07/31/2021  Medication Reconciliation was completed by comparing discharge summary, patients EMR and Pharmacy list, and upon discussion with patient.  Admitted to the hospital on 07/27/2021 due to CVA. Discharge date was 07/31/2021. Discharged from [facility] to in patient rehab,  New?Medications Started at [facility] Discharge:?? None noted.   Medication Changes at Hospital Discharge: None noted.   Medications Discontinued at Hospital Discharge: None noted  Medications that remain the same after Hospital Discharge:??  All other medications will remain the same.    Medications: Outpatient Encounter Medications as of 10/08/2021  Medication Sig   acetaminophen (TYLENOL) 325 MG tablet Take 2 tablets (650 mg total) by mouth every 6 (six) hours as needed for mild pain (or Fever >/= 101).   amLODipine (NORVASC) 10 MG tablet Take 1 tablet (10 mg total) by mouth daily.   artificial tears (LACRILUBE) OINT ophthalmic ointment Place into both eyes every 4 (four) hours as needed for dry eyes.   B Complex-C (B-COMPLEX WITH VITAMIN C) tablet Take 1 tablet by mouth daily.   clonazepam (KLONOPIN) 0.125 MG disintegrating tablet Take 1 tablet (0.125 mg total) by mouth daily.   clopidogrel (PLAVIX) 75 MG tablet Take 1 tablet (75 mg total) by mouth daily.   diclofenac Sodium (VOLTAREN) 1 % GEL Apply 2 g topically 4 (four) times daily.   ezetimibe (ZETIA) 10 MG tablet TAKE 1 TABLET BY MOUTH DAILY   FLUoxetine (PROZAC) 20 MG capsule TAKE (1) CAPSULE BY MOUTH EACH MORNING.   fluticasone (FLONASE) 50  MCG/ACT nasal spray  Place 2 sprays into both nostrils daily.   fluticasone (FLONASE) 50 MCG/ACT nasal spray Place 2 sprays into both nostrils daily.   glimepiride (AMARYL) 1 MG tablet Take 0.5 tablets (0.5 mg total) by mouth 2 (two) times daily.   hydrALAZINE (APRESOLINE) 100 MG tablet Take 1 tablet (100 mg total) by mouth every 8 (eight) hours.   loratadine (CLARITIN) 10 MG tablet Take 1 tablet (10 mg total) by mouth daily.   magnesium gluconate (MAGONATE) 500 MG tablet Take 0.5 tablets (250 mg total) by mouth at bedtime.   metoprolol succinate (TOPROL-XL) 25 MG 24 hr tablet Take 0.5 tablets (12.5 mg total) by mouth daily.   montelukast (SINGULAIR) 10 MG tablet Take 1 tablet (10 mg total) by mouth at bedtime.   pantoprazole (PROTONIX) 40 MG tablet Take 1 tablet (40 mg total) by mouth daily.   rOPINIRole (REQUIP) 0.25 MG tablet Take 1 tablet (0.25 mg total) by mouth at bedtime.   rosuvastatin (CRESTOR) 20 MG tablet Take 1 tablet (20 mg total) by mouth daily.   simvastatin (ZOCOR) 40 MG tablet TAKE (1) TABLET BY MOUTH AT BEDTIME. (Patient taking differently: Take 40 mg by mouth at bedtime.)   traZODone (DESYREL) 50 MG tablet Take 1 tablet (50 mg total) by mouth at bedtime.   vitamin B-12 (CYANOCOBALAMIN) 1000 MCG tablet Take 1,000 mcg by mouth daily.   No facility-administered encounter medications on file as of 10/08/2021.   Current antihypertensive regimen:  Metoprolol XL 12.5mg  Amlodipine 10mg  daily Hydralazine 100mg  q8h  How often are you checking your Blood Pressure?  Patient's son reported checking his blood pressure daily.   Current home BP readings: 140/80 yesterday    What recent interventions/DTPs have been made by any provider to improve Blood Pressure control since last CPP Visit:  Patients son denied any recent changes to his blood pressure regimen.    Any recent hospitalizations or ED visits since last visit with CPP?  Patient did have ED visit on 10/05/21 and a direct  admit to skilled nursing facility 07/31/2021 - 08/26/2021, He is now staying with his son.    What diet changes have been made to improve Blood Pressure Control?  Patient's son denied any recent changes to his medication   What exercise is being done to improve your Blood Pressure Control?  Patient's son reported patient they are being cautious of patient's salt intake.    Adherence Review: Is the patient currently on ACE/ARB medication? No Does the patient have >5 day gap between last estimated fill dates? Per fill history yes.   Metoprolol XL 12.5mg  - last filled 07/31/2021 30 days  Amlodipine 10mg  daily - last filled 07/31/2021 30 days  Hydralazine 100mg  q8h - last filled 08/26/21 90 days   Care Gaps  AWV: overdue  Colonoscopy: aged out DM Eye Exam: due 03/03/22 DM Foot Exam: due 12/15/2021 Microalbumin: done 11/17/17 HbgAIC: done 05/23/18 (6.9) DEXA: N/A Mammogram: N/A  Star Rating Drugs: rosuvastatin (CRESTOR) 20 MG tablet - started 10/02/21 glimepiride (AMARYL) 1 MG tablet - last filled 08/26/21 90 days   Future Appointments  Date Time Provider [place]  11/03/2021  1:20 PM Lelon Perla, MD [facility]  11/04/2021 10:00 AM Penumalli, Earlean Polka, MD [facility] None  12/31/2021 12:15 PM [facility] PHARMACIST [facility] None   Jobe Gibbon, [place] Clinical Pharmacist Assistant  [phone]

## 2021-10-12 DIAGNOSIS — I2510 Atherosclerotic heart disease of native coronary artery without angina pectoris: Secondary | ICD-10-CM | POA: Diagnosis not present

## 2021-10-12 DIAGNOSIS — E1122 Type 2 diabetes mellitus with diabetic chronic kidney disease: Secondary | ICD-10-CM | POA: Diagnosis not present

## 2021-10-12 DIAGNOSIS — N183 Chronic kidney disease, stage 3 unspecified: Secondary | ICD-10-CM | POA: Diagnosis not present

## 2021-10-12 DIAGNOSIS — J449 Chronic obstructive pulmonary disease, unspecified: Secondary | ICD-10-CM | POA: Diagnosis not present

## 2021-10-12 DIAGNOSIS — I129 Hypertensive chronic kidney disease with stage 1 through stage 4 chronic kidney disease, or unspecified chronic kidney disease: Secondary | ICD-10-CM | POA: Diagnosis not present

## 2021-10-12 DIAGNOSIS — I69354 Hemiplegia and hemiparesis following cerebral infarction affecting left non-dominant side: Secondary | ICD-10-CM | POA: Diagnosis not present

## 2021-10-13 ENCOUNTER — Telehealth: Payer: Self-pay | Admitting: Pharmacist

## 2021-10-13 ENCOUNTER — Other Ambulatory Visit: Payer: Self-pay

## 2021-10-13 DIAGNOSIS — E1122 Type 2 diabetes mellitus with diabetic chronic kidney disease: Secondary | ICD-10-CM | POA: Diagnosis not present

## 2021-10-13 DIAGNOSIS — I69354 Hemiplegia and hemiparesis following cerebral infarction affecting left non-dominant side: Secondary | ICD-10-CM | POA: Diagnosis not present

## 2021-10-13 DIAGNOSIS — N183 Chronic kidney disease, stage 3 unspecified: Secondary | ICD-10-CM | POA: Diagnosis not present

## 2021-10-13 DIAGNOSIS — I2510 Atherosclerotic heart disease of native coronary artery without angina pectoris: Secondary | ICD-10-CM | POA: Diagnosis not present

## 2021-10-13 DIAGNOSIS — J449 Chronic obstructive pulmonary disease, unspecified: Secondary | ICD-10-CM | POA: Diagnosis not present

## 2021-10-13 DIAGNOSIS — I129 Hypertensive chronic kidney disease with stage 1 through stage 4 chronic kidney disease, or unspecified chronic kidney disease: Secondary | ICD-10-CM | POA: Diagnosis not present

## 2021-10-13 MED ORDER — ROPINIROLE HCL 0.25 MG PO TABS: 0.2500 mg | ORAL_TABLET | Freq: Every day | ORAL | 3 refills | Status: DC

## 2021-10-13 NOTE — Progress Notes (Signed)
° ° °  Chronic Care Management Pharmacy Assistant   Name: HARDIE VELTRE  MRN: [id number] DOB: 1933/04/17   Reason for Encounter: Patient phone call to [place]   Patient's son call and inform CPP yesterday while giving patient his medications he got distracted and realized that evening he forgot to give patient his medications. He called his pharmacist and was advised it was ok to give patient his medications so he did. He wanted to just inform CPP he will wait until mid morning to give patient his medications today and then will be back on track in the am tomorrow. I did review patient's medication list with son and recommended he checked his vitals and blood pressure prior to giving him his medications mid morning and call back if any concerns with low blood pressure reading prior to giving patient his medications today. Patient's son voiced understanding. Will forward information on to CPP and patients son will return call with any additional concerns.  Jobe Gibbon, [facility] Clinical Pharmacist Assistant  [phone]

## 2021-10-13 NOTE — Chronic Care Management (AMB) (Signed)
°  Care Management   Note  10/13/2021 Name: Barry [name] MRN: [id number] DOB: January 15, 1933  Barry [name] is a 87 y.o. year old male who is a primary care patient of Susy Frizzle, MD and is actively engaged with the care management team. I reached out to [name] by phone today to assist with scheduling an initial visit with the RN Case Manager  Follow up plan: Telephone appointment with care management team member scheduled for:10/16/21  [place] Management  Direct Dial: [phone]

## 2021-10-14 NOTE — Chronic Care Management (AMB) (Signed)
Returned caregiver call.  Patient has started new statin, also switched to [place].  Currently on samples of Farxiga with patient assistance in progress.  Still interested in switching to Upstream for packaging and synchronization.  Will coordinate once patient assistance is completed.  Recent episodes of elevated BP - went to ER. He is going to continue to monitor and record BP. Will have CMA check in 30 days, he also has upcoming appt with nurse where he will report BP.  May need to consider addition of ramipril or HCTZ as he was on previously.  10 minutes spent in review, coordination, and documentation. Beverly Milch, PharmD, CPP Clinical Pharmacist Practitioner [place] [phone]

## 2021-10-15 DIAGNOSIS — E1122 Type 2 diabetes mellitus with diabetic chronic kidney disease: Secondary | ICD-10-CM | POA: Diagnosis not present

## 2021-10-15 DIAGNOSIS — J449 Chronic obstructive pulmonary disease, unspecified: Secondary | ICD-10-CM | POA: Diagnosis not present

## 2021-10-15 DIAGNOSIS — I2510 Atherosclerotic heart disease of native coronary artery without angina pectoris: Secondary | ICD-10-CM | POA: Diagnosis not present

## 2021-10-15 DIAGNOSIS — N183 Chronic kidney disease, stage 3 unspecified: Secondary | ICD-10-CM | POA: Diagnosis not present

## 2021-10-15 DIAGNOSIS — I129 Hypertensive chronic kidney disease with stage 1 through stage 4 chronic kidney disease, or unspecified chronic kidney disease: Secondary | ICD-10-CM | POA: Diagnosis not present

## 2021-10-15 DIAGNOSIS — I69354 Hemiplegia and hemiparesis following cerebral infarction affecting left non-dominant side: Secondary | ICD-10-CM | POA: Diagnosis not present

## 2021-10-16 ENCOUNTER — Telehealth: Payer: Medicare Other

## 2021-10-16 DIAGNOSIS — I129 Hypertensive chronic kidney disease with stage 1 through stage 4 chronic kidney disease, or unspecified chronic kidney disease: Secondary | ICD-10-CM | POA: Diagnosis not present

## 2021-10-16 DIAGNOSIS — E1122 Type 2 diabetes mellitus with diabetic chronic kidney disease: Secondary | ICD-10-CM | POA: Diagnosis not present

## 2021-10-16 DIAGNOSIS — N183 Chronic kidney disease, stage 3 unspecified: Secondary | ICD-10-CM | POA: Diagnosis not present

## 2021-10-16 DIAGNOSIS — J449 Chronic obstructive pulmonary disease, unspecified: Secondary | ICD-10-CM | POA: Diagnosis not present

## 2021-10-16 DIAGNOSIS — I69354 Hemiplegia and hemiparesis following cerebral infarction affecting left non-dominant side: Secondary | ICD-10-CM | POA: Diagnosis not present

## 2021-10-16 DIAGNOSIS — I2510 Atherosclerotic heart disease of native coronary artery without angina pectoris: Secondary | ICD-10-CM | POA: Diagnosis not present

## 2021-10-17 DIAGNOSIS — I10 Essential (primary) hypertension: Secondary | ICD-10-CM | POA: Diagnosis not present

## 2021-10-17 DIAGNOSIS — I2510 Atherosclerotic heart disease of native coronary artery without angina pectoris: Secondary | ICD-10-CM | POA: Diagnosis not present

## 2021-10-17 DIAGNOSIS — E785 Hyperlipidemia, unspecified: Secondary | ICD-10-CM

## 2021-10-17 DIAGNOSIS — F32A Depression, unspecified: Secondary | ICD-10-CM

## 2021-10-17 DIAGNOSIS — Z7984 Long term (current) use of oral hypoglycemic drugs: Secondary | ICD-10-CM | POA: Diagnosis not present

## 2021-10-17 DIAGNOSIS — E1159 Type 2 diabetes mellitus with other circulatory complications: Secondary | ICD-10-CM | POA: Diagnosis not present

## 2021-10-19 ENCOUNTER — Encounter: Payer: Self-pay | Admitting: Family Medicine

## 2021-10-19 DIAGNOSIS — E1122 Type 2 diabetes mellitus with diabetic chronic kidney disease: Secondary | ICD-10-CM | POA: Diagnosis not present

## 2021-10-19 DIAGNOSIS — I129 Hypertensive chronic kidney disease with stage 1 through stage 4 chronic kidney disease, or unspecified chronic kidney disease: Secondary | ICD-10-CM | POA: Diagnosis not present

## 2021-10-19 DIAGNOSIS — I69354 Hemiplegia and hemiparesis following cerebral infarction affecting left non-dominant side: Secondary | ICD-10-CM | POA: Diagnosis not present

## 2021-10-19 DIAGNOSIS — J449 Chronic obstructive pulmonary disease, unspecified: Secondary | ICD-10-CM | POA: Diagnosis not present

## 2021-10-19 DIAGNOSIS — N183 Chronic kidney disease, stage 3 unspecified: Secondary | ICD-10-CM | POA: Diagnosis not present

## 2021-10-19 DIAGNOSIS — I2510 Atherosclerotic heart disease of native coronary artery without angina pectoris: Secondary | ICD-10-CM | POA: Diagnosis not present

## 2021-10-20 MED ORDER — CLONAZEPAM 0.125 MG PO TBDP
0.1250 mg | ORAL_TABLET | Freq: Every day | ORAL | 0 refills | Status: DC
Start: 2021-10-20 — End: 2021-10-29

## 2021-10-21 NOTE — Progress Notes (Signed)
HPI: FU CAD. Patient underwent cardiac catheterization in 2005; he was found to have nonobstructive disease other than a 90% stenosis in a small branch of the right coronary artery. He was noted to have an 80% iliac stenosis and an abdominal aortic aneurysm. Procedure complicated by peripheral emboli requiring amputation of a digit. Patient has had stent graft of abdominal aortic aneurysm. Had CVA October 2022.  CTA showed 30 to 40% stenosis on the right and mild stenosis on the left.  There was note of 1 cm enhancing lesion in the right parotid gland and ENT referral recommended.  Echocardiogram October 2022 showed normal LV function, mild left ventricular hypertrophy, grade 1 diastolic dysfunction, mild to moderate aortic stenosis with mean gradient 14 mmHg.  TEE October 2022 showed normal LV function, no left atrial appendage thrombus, moderate aortic stenosis with mean gradient 23 mmHg, moderate plaque in the aorta and negative saline microcavitation study.  Monitor November 2022 showed sinus rhythm with occasional PAC, brief PAT, PVCs and 11 beats nonsustained ventricular tachycardia. Since last seen, he has some dyspnea but denies chest pain, palpitations or syncope.  No pedal edema.  He does have residual bilateral lower extremity weakness from his recent stroke.  Current Outpatient Medications  Medication Sig Dispense Refill   acetaminophen (TYLENOL) 325 MG tablet Take 2 tablets (650 mg total) by mouth every 6 (six) hours as needed for mild pain (or Fever >/= 101).     amLODipine (NORVASC) 10 MG tablet Take 1 tablet (10 mg total) by mouth daily. 90 tablet 3   artificial tears (LACRILUBE) OINT ophthalmic ointment Place into both eyes every 4 (four) hours as needed for dry eyes.     B Complex-C (B-COMPLEX WITH VITAMIN C) tablet Take 1 tablet by mouth daily.     clonazepam (KLONOPIN) 0.125 MG disintegrating tablet Take 1 tablet (0.125 mg total) by mouth 3 (three) times daily. 90 tablet 0    clopidogrel (PLAVIX) 75 MG tablet Take 1 tablet (75 mg total) by mouth daily. 90 tablet 3   diclofenac Sodium (VOLTAREN) 1 % GEL Apply 2 g topically 4 (four) times daily.     ezetimibe (ZETIA) 10 MG tablet TAKE 1 TABLET BY MOUTH DAILY 90 tablet 3   FLUoxetine (PROZAC) 20 MG capsule TAKE (1) CAPSULE BY MOUTH EACH MORNING. 90 capsule 2   fluticasone (FLONASE) 50 MCG/ACT nasal spray Place 2 sprays into both nostrils daily. 16 g 6   hydrALAZINE (APRESOLINE) 100 MG tablet Take 1 tablet (100 mg total) by mouth every 8 (eight) hours. 90 tablet 0   metoprolol succinate (TOPROL-XL) 25 MG 24 hr tablet Take 0.5 tablets (12.5 mg total) by mouth daily. 90 tablet 3   montelukast (SINGULAIR) 10 MG tablet Take 1 tablet (10 mg total) by mouth at bedtime. 90 tablet 1   rOPINIRole (REQUIP) 0.25 MG tablet Take 1 tablet (0.25 mg total) by mouth at bedtime. 90 tablet 3   rosuvastatin (CRESTOR) 20 MG tablet Take 1 tablet (20 mg total) by mouth daily. 90 tablet 3   valsartan (DIOVAN) 160 MG tablet Take 1 tablet (160 mg total) by mouth daily. 90 tablet 3   vitamin B-12 (CYANOCOBALAMIN) 1000 MCG tablet Take 1,000 mcg by mouth daily.     magnesium gluconate (MAGONATE) 500 MG tablet Take 0.5 tablets (250 mg total) by mouth at bedtime. (Patient not taking: Reported on 10/23/2021)     No current facility-administered medications for this visit.     Past  Medical History:  Diagnosis Date   AAA (abdominal aortic aneurysm) 3-09   4.7 cm   Allergy    rhinitis   Anxiety    Cancer ([place])    skin   Cataract    Depression    Diabetes mellitus    Diverticulosis    GERD (gastroesophageal reflux disease)    Hepatitis    Hx of colonic polyps 08/26/2015   Hyperlipidemia    Hypertension    Nephrolithiasis    Personal history of colonic polyps    adenomas, serrated also   PVD (peripheral vascular disease) ([place])    Tobacco abuse     Past Surgical History:  Procedure Laterality Date   COLONOSCOPY  2006, 2008, 2010,  05/27/2011   numerous adenomas - 13 in 2006, 3, 2008, 2 2012 (up to 1 cm), 4 diminutive adenomas, serrated adenomas 2012. Diverticulosis and hemorrhoids also.   EYE SURGERY     EYE SURGERY Left    Kidney stone operation     lower limb amputation, other toe 5th     percutaneous stent graft repair of infrarenal AAA  11/10   5.6 cm (T. early)   surgical excision basal cell carcinoma     TEE WITHOUT CARDIOVERSION N/A 07/29/2021   Procedure: TRANSESOPHAGEAL ECHOCARDIOGRAM (TEE);  Surgeon: Geralynn Rile, MD;  Location: [facility];  Service: Cardiovascular;  Laterality: N/A;   tympanic eardrum repair     VASECTOMY      Social History   Socioeconomic History   Marital status: Widowed    Spouse name: Not on file   Number of children: 2   Years of education: Not on file   Highest education level: Not on file  Occupational History    Employer: RETIRED  Tobacco Use   Smoking status: Former    Packs/day: 1.00    Years: 60.00    Pack years: 60.00    Types: Cigarettes   Smokeless tobacco: Never   Tobacco comments:    Hasn't smoked since he had his stroke   Vaping Use   Vaping Use: Never used  Substance and Sexual Activity   Alcohol use: No   Drug use: No   Sexual activity: Yes    Partners: Female    Birth control/protection: Surgical    Comment: vasectomy  Other Topics Concern   Not on file  Social History Narrative   HSG, 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 retired 50 years - keeping busy. Lives alone. 1- step-daughter, 1 son - '68; 3 grandchildren. does odd-jobs, yard work   Oncologist: Not on [employer] Insecurity: No Food Insecurity   Worried About Charity fundraiser in the Last Year: Never true   Arboriculturist in the Last Year: Never true  Transportation Needs: No Transportation Needs   Lack of Transportation (Medical): No   Lack of Transportation (Non-Medical): No  Physical Activity: Not on file  Stress: Not on  file  Social Connections: Not on file  Intimate Partner Violence: Not on file    Family History  Problem Relation Age of Onset   Emphysema Father    Cancer Brother        Lung   Depression Brother    Depression Sister    Lung disease Sister        Fibrosis and died from pneumonia on 03/31/2013   Kidney disease Sister    Diabetes Sister    Learning disabilities Sister  Mental retardation Sister    Heart disease Mother    Cancer Brother    Colon cancer Neg Hx    Dementia Neg Hx    Alcohol abuse Neg Hx    Drug abuse Neg Hx    Paranoid behavior Neg Hx    Schizophrenia Neg Hx    Anxiety disorder Neg Hx    Bipolar disorder Neg Hx    OCD Neg Hx    Sexual abuse Neg Hx    Physical abuse Neg Hx    Seizures Neg Hx    Esophageal cancer Neg Hx    Stomach cancer Neg Hx    Rectal cancer Neg Hx     ROS: no fevers or chills, productive cough, hemoptysis, dysphasia, odynophagia, melena, hematochezia, dysuria, hematuria, rash, seizure activity, orthopnea, PND, pedal edema, claudication. Remaining systems are negative.  Physical Exam: Well-developed well-nourished in no acute distress.  Skin is warm and dry.  HEENT is normal.  Neck is supple.  Chest is clear to auscultation with normal expansion.  Cardiovascular exam is regular rate and rhythm.  Abdominal exam nontender or distended. No masses palpated. Extremities show no edema. neuro grossly intact   A/P  1 coronary artery disease-patient doing well from a symptomatic standpoint with no chest pain.  Plan to continue medical therapy with aspirin and statin.  2 hypertension-blood pressure elevated; however valsartan was recently added.  He will continue to follow his blood pressure and we will increase dose as needed.  3 hyperlipidemia-continue Zetia and crestor.  4 status post abdominal aortic aneurysm repair-followed by vascular surgery.  5 aortic stenosis-moderate on TEE October 2022.  Plan follow-up study October 2023.   May need TAVR in the future.  6 tobacco abuse-patient discontinued.  7 parotid mass-patient instructed to follow-up with ENT for this.  Kirk Ruths, MD

## 2021-10-23 ENCOUNTER — Telehealth: Payer: Self-pay

## 2021-10-23 ENCOUNTER — Ambulatory Visit (INDEPENDENT_AMBULATORY_CARE_PROVIDER_SITE_OTHER): Payer: Medicare Other | Admitting: *Deleted

## 2021-10-23 DIAGNOSIS — E119 Type 2 diabetes mellitus without complications: Secondary | ICD-10-CM

## 2021-10-23 DIAGNOSIS — I69354 Hemiplegia and hemiparesis following cerebral infarction affecting left non-dominant side: Secondary | ICD-10-CM | POA: Diagnosis not present

## 2021-10-23 DIAGNOSIS — E0840 Diabetes mellitus due to underlying condition with diabetic neuropathy, unspecified: Secondary | ICD-10-CM | POA: Diagnosis not present

## 2021-10-23 DIAGNOSIS — I129 Hypertensive chronic kidney disease with stage 1 through stage 4 chronic kidney disease, or unspecified chronic kidney disease: Secondary | ICD-10-CM | POA: Diagnosis not present

## 2021-10-23 DIAGNOSIS — E1122 Type 2 diabetes mellitus with diabetic chronic kidney disease: Secondary | ICD-10-CM | POA: Diagnosis not present

## 2021-10-23 DIAGNOSIS — M2041 Other hammer toe(s) (acquired), right foot: Secondary | ICD-10-CM | POA: Diagnosis not present

## 2021-10-23 DIAGNOSIS — M79674 Pain in right toe(s): Secondary | ICD-10-CM | POA: Diagnosis not present

## 2021-10-23 DIAGNOSIS — I10 Essential (primary) hypertension: Secondary | ICD-10-CM

## 2021-10-23 DIAGNOSIS — N183 Chronic kidney disease, stage 3 unspecified: Secondary | ICD-10-CM | POA: Diagnosis not present

## 2021-10-23 DIAGNOSIS — J449 Chronic obstructive pulmonary disease, unspecified: Secondary | ICD-10-CM | POA: Diagnosis not present

## 2021-10-23 DIAGNOSIS — M21372 Foot drop, left foot: Secondary | ICD-10-CM | POA: Diagnosis not present

## 2021-10-23 DIAGNOSIS — I2510 Atherosclerotic heart disease of native coronary artery without angina pectoris: Secondary | ICD-10-CM | POA: Diagnosis not present

## 2021-10-23 DIAGNOSIS — B351 Tinea unguium: Secondary | ICD-10-CM | POA: Diagnosis not present

## 2021-10-23 NOTE — Patient Instructions (Signed)
Visit Information   Thank you for taking time to visit with me today. Please don't hesitate to contact me if I can be of assistance to you before our next scheduled telephone appointment.  Following are the goals we discussed today:  (Copy and paste patient goals from clinical care plan here)  Our next appointment is by telephone on 11/20/2021 at 1045 am  Hypoglycemia Hypoglycemia is when the sugar (glucose) level in your blood is too low. Low blood sugar can happen to people who have diabetes and people who do not have diabetes. Low blood sugar can happen quickly, and it can be an emergency. What are the causes? This condition happens most often in people who have diabetes. It may be caused by: Diabetes medicine. Not eating enough, or not eating often enough. Doing more physical activity. Drinking alcohol on an empty stomach. If you do not have diabetes, this condition may be caused by: A tumor in the pancreas. Not eating enough, or not eating for long periods at a time (fasting). A very bad infection or illness. Problems after having weight loss (bariatric) surgery. Kidney failure or liver failure. Certain medicines. What increases the risk? This condition is more likely to develop in people who: Have diabetes and take medicines to lower their blood sugar. Abuse alcohol. Have a very bad illness. What are the signs or symptoms? Mild Hunger. Sweating and feeling clammy. Feeling dizzy or light-headed. Being sleepy or having trouble sleeping. Feeling like you may vomit (nauseous). A fast heartbeat. A headache. Blurry vision. Mood changes, such as: Being grouchy. Feeling worried or nervous (anxious). Tingling or loss of feeling (numbness) around your mouth, lips, or tongue. Moderate Confusion and poor judgment. Behavior changes. Weakness. Uneven heartbeat. Trouble with moving (coordination). Very low Very low blood sugar (severe hypoglycemia) is a medical emergency. It  can cause: Fainting. Seizures. Loss of consciousness (coma). Death. How is this treated? Treating low blood sugar Low blood sugar is often treated by eating or drinking something that has sugar in it right away. The food or drink should contain 15 grams of a fast-acting carb (carbohydrate). Options include: 4 oz (120 mL) of fruit juice. 4 oz (120 Low-Sodium Eating Plan Sodium, which is an element that makes up salt, helps you maintain a healthy balance of fluids in your body. Too much sodium can increase your blood pressure and cause fluid and waste to be held in your body. Your health care provider or dietitian may recommend following this plan if you have high blood pressure (hypertension), kidney disease, liver disease, or heart failure. Eating less sodium can help lower your blood pressure, reduce swelling, and protect your heart, liver, and kidneys. What are tips for following this plan? Reading food labels The Nutrition Facts label lists the amount of sodium in one serving of the food. If you eat more than one serving, you must multiply the listed amount of sodium by the number of servings. Choose foods with less than 140 mg of sodium per serving. Avoid foods with 300 mg of sodium or more per serving. Shopping  Look for lower-sodium products, often labeled as "low-sodium" or "no salt added." Always check the sodium content, even if foods are labeled as "unsalted" or "no salt added." Buy fresh foods. Avoid canned foods and pre-made or frozen meals. Avoid canned, cured, or processed meats. Buy breads that have less than 80 mg of sodium per slice. Cooking  Eat more home-cooked food and less restaurant, buffet, and fast food. Avoid  adding salt when cooking. Use salt-free seasonings or herbs instead of table salt or sea salt. Check with your health care provider or pharmacist before using salt substitutes. Cook with plant-based oils, such as canola, sunflower, or olive oil. Meal  planning When eating at a restaurant, ask that your food be prepared with less salt or no salt, if possible. Avoid dishes labeled as brined, pickled, cured, smoked, or made with soy sauce, miso, or teriyaki sauce. Avoid foods that contain MSG (monosodium glutamate). MSG is sometimes added to [place] food, bouillon, and some canned foods. Make meals that can be grilled, baked, poached, roasted, or steamed. These are generally made with less sodium. General information Most people on this plan should limit their sodium intake to 1,500-2,000 mg (milligrams) of sodium each day. What foods should I eat? Fruits Fresh, frozen, or canned fruit. Fruit juice. Vegetables Fresh or frozen vegetables. "No salt added" canned vegetables. "No salt added" tomato sauce and paste. Low-sodium or reduced-sodium tomato and vegetable juice. Grains Low-sodium cereals, including oats, puffed wheat and rice, and shredded wheat. Low-sodium crackers. Unsalted rice. Unsalted pasta. Low-sodium bread. Whole-grain breads and whole-grain pasta. Meats and other proteins Fresh or frozen (no salt added) meat, poultry, seafood, and fish. Low-sodium canned tuna and salmon. Unsalted nuts. Dried peas, beans, and lentils without added salt. Unsalted canned beans. Eggs. Unsalted nut butters. Dairy Milk. Soy milk. Cheese that is naturally low in sodium, such as ricotta cheese, fresh mozzarella, or Swiss cheese. Low-sodium or reduced-sodium cheese. Cream cheese. Yogurt. Seasonings and condiments Fresh and dried herbs and spices. Salt-free seasonings. Low-sodium mustard and ketchup. Sodium-free salad dressing. Sodium-free light mayonnaise. Fresh or refrigerated horseradish. Lemon juice. Vinegar. Other foods Homemade, reduced-sodium, or low-sodium soups. Unsalted popcorn and pretzels. Low-salt or salt-free chips. The items listed above may not be a complete list of foods and beverages you can eat. Contact a dietitian for more  information. What foods should I avoid? Vegetables Sauerkraut, pickled vegetables, and relishes. Olives. [place] fries. Onion rings. Regular canned vegetables (not low-sodium or reduced-sodium). Regular canned tomato sauce and paste (not low-sodium or reduced-sodium). Regular tomato and vegetable juice (not low-sodium or reduced-sodium). Frozen vegetables in sauces. Grains Instant hot cereals. Bread stuffing, pancake, and biscuit mixes. Croutons. Seasoned rice or pasta mixes. Noodle soup cups. Boxed or frozen macaroni and cheese. Regular salted crackers. Self-rising flour. Meats and other proteins Meat or fish that is salted, canned, smoked, spiced, or pickled. Precooked or cured meat, such as sausages or meat loaves. Berniece Salines. Ham. Pepperoni. Hot dogs. Corned beef. Chipped beef. Salt pork. Jerky. Pickled herring. Anchovies and sardines. Regular canned tuna. Salted nuts. Dairy Processed cheese and cheese spreads. Hard cheeses. Cheese curds. Blue cheese. Feta cheese. String cheese. Regular cottage cheese. Buttermilk. Canned milk. Fats and oils Salted butter. Regular margarine. Ghee. Bacon fat. Seasonings and condiments Onion salt, garlic salt, seasoned salt, table salt, and sea salt. Canned and packaged gravies. Worcestershire sauce. Tartar sauce. Barbecue sauce. Teriyaki sauce. Soy sauce, including reduced-sodium. Steak sauce. Fish sauce. Oyster sauce. Cocktail sauce. Horseradish that you find on the shelf. Regular ketchup and mustard. Meat flavorings and tenderizers. Bouillon cubes. Hot sauce. Pre-made or packaged marinades. Pre-made or packaged taco seasonings. Relishes. Regular salad dressings. Salsa. Other foods Salted popcorn and pretzels. Corn chips and puffs. Potato and tortilla chips. Canned or dried soups. Pizza. Frozen entrees and pot pies. The items listed above may not be a complete list of foods and beverages you should avoid. Contact a dietitian for more  information. Summary Eating less  sodium can help lower your blood pressure, reduce swelling, and protect your heart, liver, and kidneys. Most people on this plan should limit their sodium intake to 1,500-2,000 mg (milligrams) of sodium each day. Canned, boxed, and frozen foods are high in sodium. Restaurant foods, fast foods, and pizza are also very high in sodium. You also get sodium by adding salt to food. Try to cook at home, eat more fresh fruits and vegetables, and eat less fast food and canned, processed, or prepared foods. This information is not intended to replace advice given to you by your health care provider. Make sure you discuss any questions you have with your health care provider. Document Revised: 11/09/2019 Document Reviewed: 09/05/2019 Elsevier Patient Education  2022 [employer].    Please call the care guide team at [phone] if you need to cancel or reschedule your appointment.   If you are experiencing a Mental Health or [place] or need someone to talk to, please call the Suicide and Crisis Lifeline: 988 call the [place] National Suicide Prevention Lifeline: (940)536-0203 or TTY: 424 533 2500 TTY (463)380-8129) to talk to a trained counselor call 1-800-273-TALK (toll free, 24 hour hotline) go to [facility] [place] [phone]) call the [place]: [phone] call 911   Following is a copy of your full care plan:  Care Plan : [place] of Care  Updates made by Kassie Mends, RN since 10/23/2021 12:00 AM     Problem: No plan of care established for management of chronic disease states  (HTN, DM2, CVA, COPD)   Priority: High     Long-Range Goal: Development of plan of care for chronic disease management  (HTN, DM2, CVA, COPD)   Start Date: 10/23/2021  Expected End Date: 04/21/2022  Priority: High  Note:   Current Barriers:  Knowledge Deficits related to plan of care for management of HTN  and DMII  No Advanced Directives in place- patient's son requests information be mailed Spoke with patient's son Cristan Scherzer DPR who reports pt is temporarily living with him and his wife, pt eventually wants to go back to his home.  Patient has home health PT, OT working with him after being inpatient rehab after CVA back in early October 2022.  Son reports pt has balance issues, left side deficits with mainly left leg affected which has led to some falls, has DME in the home and stair lift for going up to the shower, has hired help at son's home from 8am-12 noon which has helped tremendously.   Son reports blood pressure is checked frequently with varied readings "sometimes good and sometimes high" , reports CBG checked once daily with fasting ranges 104-171. Patient PHQ-9=6, pt is on medication, son declines LCSW services today  RNCM Clinical Goal(s):  Patient will verbalize understanding of plan for management of HTN and DMII as evidenced by patient report, review of EHR and  through collaboration with RN Care manager, provider, and care team.   Interventions: 1:1 collaboration with primary care provider regarding development and update of comprehensive plan of care as evidenced by provider attestation and co-signature Inter-disciplinary care team collaboration (see longitudinal plan of care) Evaluation of current treatment plan related to  self management and patient's adherence to plan as established by provider   Diabetes Interventions:  (Status:  New goal. and Goal on track:  Yes.) Long Term Goal Assessed patient's understanding of A1c goal: <  7% Provided education to patient about basic DM disease process Reviewed medications with patient and discussed importance of medication adherence Discussed plans with patient for ongoing care management follow up and provided patient with direct contact information for care management team Review of patient status, including review of consultants  reports, relevant laboratory and other test results, and medications completed Screening for signs and symptoms of depression related to chronic disease state  Assessed social determinant of health barriers Reviewed carbohydrate modified diet Education sent via My Chart- hypoglycemia Advanced directives packet mailed Lab Results  Component Value Date   HGBA1C 6.7 (H) 07/27/2021   Hypertension Interventions:  (Status:  New goal. and Goal on track:  Yes.) Long Term Goal Last practice recorded BP readings:  BP Readings from Last 3 Encounters:  10/05/21 (!) 163/91  10/02/21 (!) 142/78  08/26/21 (!) 147/67  Most recent eGFR/CrCl: No results found for: EGFR  No components found for: CRCL  Evaluation of current treatment plan related to hypertension self management and patient's adherence to plan as established by provider Provided education to patient re: stroke prevention, s/s of heart attack and stroke Reviewed medications with patient and discussed importance of compliance Discussed plans with patient for ongoing care management follow up and provided patient with direct contact information for care management team  Reviewed low sodium diet and education sent via My Chart Reviewed importance of using DME in the home, working with home health PT/ OT for strengthening and endurance Reviewed safety precautions  Patient Goals/Self-Care Activities: Take medications as prescribed   Attend all scheduled provider appointments Call provider office for new concerns or questions  check blood sugar at prescribed times: once daily check feet daily for cuts, sores or redness enter blood sugar readings and medication or insulin into daily log take the blood sugar log to all doctor visits take the blood sugar meter to all doctor visits fill half of plate with vegetables check blood pressure daily write blood pressure results in a log or diary keep a blood pressure log take blood pressure log to  all doctor appointments take medications for blood pressure exactly as prescribed report new symptoms to your doctor Advanced Directives packet mailed, please look over Follow carbohydrate modified and low sodium diet Continue to work with home health PT and OT and complete prescribed exercises daily fall prevention strategies: change position slowly, use assistive device such as walker or cane (per provider recommendations) when walking, keep walkways clear, have good lighting in room. It is important to contact your provider if you have any falls, maintain muscle strength/tone by exercise per provider recommendations.       Consent to CCM Services: Mr. Rawdon was given information about Chronic Care Management services including:  CCM service includes personalized support from designated clinical staff supervised by his physician, including individualized plan of care and coordination with other care providers 24/7 contact phone numbers for assistance for urgent and routine care needs. Service will only be billed when office clinical staff spend 20 minutes or more in a month to coordinate care. Only one practitioner may furnish and bill the service in a calendar month. The patient may stop CCM services at any time (effective at the end of the month) by phone call to the office staff. The patient will be responsible for cost sharing (co-pay) of up to 20% of the service fee (after annual deductible is met).  Patient agreed to services and verbal consent obtained.   Patient verbalizes understanding of instructions  provided today and agrees to view in [place].   Telephone follow up appointment with care management team member scheduled for:  11/20/2021

## 2021-10-23 NOTE — Chronic Care Management (AMB) (Signed)
Chronic Care Management   CCM RN Visit Note  10/23/2021 Name: Barry [name] MRN: [id number] DOB: [birth year]  Subjective: Barry [name] is a 88 y.o. year old male who is a primary care patient of Pickard, Cammie Mcgee, MD. The care management team was consulted for assistance with disease management and care coordination needs.    Engaged with patient by telephone for initial visit in response to provider referral for case management and/or care coordination services.   Consent to Services:  The patient was given the following information about Chronic Care Management services today, agreed to services, and gave verbal consent: 1. CCM service includes personalized support from designated clinical staff supervised by the primary care provider, including individualized plan of care and coordination with other care providers 2. 24/7 contact phone numbers for assistance for urgent and routine care needs. 3. Service will only be billed when office clinical staff spend 20 minutes or more in a month to coordinate care. 4. Only one practitioner may furnish and bill the service in a calendar month. 5.The patient may stop CCM services at any time (effective at the end of the month) by phone call to the office staff. 6. The patient will be responsible for cost sharing (co-pay) of up to 20% of the service fee (after annual deductible is met). Patient agreed to services and consent obtained.  Patient agreed to services and verbal consent obtained.   Assessment: Review of patient past medical history, allergies, medications, health status, including review of consultants reports, laboratory and other test data, was performed as part of comprehensive evaluation and provision of chronic care management services.   SDOH (Social Determinants of Health) assessments and interventions performed:  SDOH Interventions    Flowsheet Row Most Recent Value  SDOH Interventions   Food Insecurity Interventions  Intervention Not Indicated  Transportation Interventions Intervention Not Indicated  Depression Interventions/Treatment  Medication        CCM Care Plan  Allergies  Allergen Reactions   Lisinopril-Hydrochlorothiazide Swelling    ZESTRIL   Doxycycline Rash    Outpatient Encounter Medications as of 10/23/2021  Medication Sig   acetaminophen (TYLENOL) 325 MG tablet Take 2 tablets (650 mg total) by mouth every 6 (six) hours as needed for mild pain (or Fever >/= 101).   amLODipine (NORVASC) 10 MG tablet Take 1 tablet (10 mg total) by mouth daily.   artificial tears (LACRILUBE) OINT ophthalmic ointment Place into both eyes every 4 (four) hours as needed for dry eyes.   B Complex-C (B-COMPLEX WITH VITAMIN C) tablet Take 1 tablet by mouth daily.   clonazepam (KLONOPIN) 0.125 MG disintegrating tablet Take 1 tablet (0.125 mg total) by mouth daily.   clopidogrel (PLAVIX) 75 MG tablet Take 1 tablet (75 mg total) by mouth daily.   diclofenac Sodium (VOLTAREN) 1 % GEL Apply 2 g topically 4 (four) times daily.   FLUoxetine (PROZAC) 20 MG capsule TAKE (1) CAPSULE BY MOUTH EACH MORNING.   fluticasone (FLONASE) 50 MCG/ACT nasal spray Place 2 sprays into both nostrils daily.   hydrALAZINE (APRESOLINE) 100 MG tablet Take 1 tablet (100 mg total) by mouth every 8 (eight) hours.   loratadine (CLARITIN) 10 MG tablet Take 1 tablet (10 mg total) by mouth daily.   metoprolol succinate (TOPROL-XL) 25 MG 24 hr tablet Take 0.5 tablets (12.5 mg total) by mouth daily.   montelukast (SINGULAIR) 10 MG tablet Take 1 tablet (10 mg total) by mouth at bedtime.   rOPINIRole (REQUIP) 0.25 MG  tablet Take 1 tablet (0.25 mg total) by mouth at bedtime.   rosuvastatin (CRESTOR) 20 MG tablet Take 1 tablet (20 mg total) by mouth daily.   vitamin B-12 (CYANOCOBALAMIN) 1000 MCG tablet Take 1,000 mcg by mouth daily.   ezetimibe (ZETIA) 10 MG tablet TAKE 1 TABLET BY MOUTH DAILY (Patient not taking: Reported on 10/23/2021)   fluticasone  (FLONASE) 50 MCG/ACT nasal spray Place 2 sprays into both nostrils daily. (Patient not taking: Reported on 10/23/2021)   glimepiride (AMARYL) 1 MG tablet Take 0.5 tablets (0.5 mg total) by mouth 2 (two) times daily. (Patient not taking: Reported on 10/23/2021)   magnesium gluconate (MAGONATE) 500 MG tablet Take 0.5 tablets (250 mg total) by mouth at bedtime. (Patient not taking: Reported on 10/23/2021)   pantoprazole (PROTONIX) 40 MG tablet Take 1 tablet (40 mg total) by mouth daily.   simvastatin (ZOCOR) 40 MG tablet TAKE (1) TABLET BY MOUTH AT BEDTIME. (Patient not taking: Reported on 10/23/2021)   traZODone (DESYREL) 50 MG tablet Take 1 tablet (50 mg total) by mouth at bedtime. (Patient not taking: Reported on 10/23/2021)   No facility-administered encounter medications on file as of 10/23/2021.    Patient Active Problem List   Diagnosis Date Noted   Right thalamic infarction ([place]) 07/31/2021   Stage 3a chronic kidney disease ([place])    Benign essential HTN    Left hemiparesis ([place])    Type 2 diabetes mellitus with hyperglycemia, without long-term current use of insulin ([facility])    CVA (cerebral vascular accident) ([place]) 07/27/2021   Frequent PVCs 03/20/2020   Aortic stenosis 03/20/2020   COPD (chronic obstructive pulmonary disease) ([place]) 03/20/2020   Constipation 04/30/2019   Protein-calorie malnutrition ([place]) 04/30/2019   Anxiety with depression 12/05/2018   Seasonal and perennial allergic rhinitis 08/09/2018   Carotid artery disease ([place]) 11/18/2017   Hx of colonic polyps 08/26/2015   Erectile dysfunction 02/17/2015   AAA (abdominal aortic aneurysm) without rupture ([place]) 05/21/2014   Chest pain 01/31/2014   CAD (coronary artery disease) 12/24/2013   HEARING LOSS, CONDUCTIVE, LEFT 02/23/2010   DIZZINESS, CHRONIC 05/28/2008   PULMONARY NODULE 09/07/2007   Diabetes mellitus type II, controlled ([place]) 09/06/2007   Hyperlipidemia 09/06/2007   TOBACCO ABUSE 09/06/2007   Essential hypertension  09/06/2007   Allergic rhinitis 09/06/2007   GERD 09/06/2007   BASAL CELL CARCINOMA, NOSE 09/06/2007    Conditions to be addressed/monitored:HTN and DMII  Care Plan : RN Care Manager Plan of Care  Updates made by Kassie Mends, RN since 10/23/2021 12:00 AM     Problem: No plan of care established for management of chronic disease states  (HTN, DM2, CVA, COPD)   Priority: High     Long-Range Goal: Development of plan of care for chronic disease management  (HTN, DM2, CVA, COPD)   Start Date: 10/23/2021  Expected End Date: 04/21/2022  Priority: High  Note:   Current Barriers:  Knowledge Deficits related to plan of care for management of HTN and DMII  No Advanced Directives in place- patient's son requests information be mailed Spoke with patient's son Mickell Birdwell DPR who reports pt is temporarily living with him and his wife, pt eventually wants to go back to his home.  Patient has home health PT, OT working with him after being inpatient rehab after CVA back in early October 2022.  Son reports pt has balance issues, left side deficits with mainly left leg affected which has led to some falls, has DME in the  home and stair lift for going up to the shower, has hired help at son's home from 8am-12 noon which has helped tremendously.   Son reports blood pressure is checked frequently with varied readings "sometimes good and sometimes high" , reports CBG checked once daily with fasting ranges 104-171. Patient PHQ-9=6, pt is on medication, son declines LCSW services today  RNCM Clinical Goal(s):  Patient will verbalize understanding of plan for management of HTN and DMII as evidenced by patient report, review of EHR and  through collaboration with RN Care manager, provider, and care team.   Interventions: 1:1 collaboration with primary care provider regarding development and update of comprehensive plan of care as evidenced by provider attestation and co-signature Inter-disciplinary care team  collaboration (see longitudinal plan of care) Evaluation of current treatment plan related to  self management and patient's adherence to plan as established by provider   Diabetes Interventions:  (Status:  New goal. and Goal on track:  Yes.) Long Term Goal Assessed patient's understanding of A1c goal: <7% Provided education to patient about basic DM disease process Reviewed medications with patient and discussed importance of medication adherence Discussed plans with patient for ongoing care management follow up and provided patient with direct contact information for care management team Review of patient status, including review of consultants reports, relevant laboratory and other test results, and medications completed Screening for signs and symptoms of depression related to chronic disease state  Assessed social determinant of health barriers Reviewed carbohydrate modified diet Education sent via My Chart- hypoglycemia Advanced directives packet mailed Lab Results  Component Value Date   HGBA1C 6.7 (H) 07/27/2021   Hypertension Interventions:  (Status:  New goal. and Goal on track:  Yes.) Long Term Goal Last practice recorded BP readings:  BP Readings from Last 3 Encounters:  10/05/21 (!) 163/91  10/02/21 (!) 142/78  08/26/21 (!) 147/67  Most recent eGFR/CrCl: No results found for: EGFR  No components found for: CRCL  Evaluation of current treatment plan related to hypertension self management and patient's adherence to plan as established by provider Provided education to patient re: stroke prevention, s/s of heart attack and stroke Reviewed medications with patient and discussed importance of compliance Discussed plans with patient for ongoing care management follow up and provided patient with direct contact information for care management team  Reviewed low sodium diet and education sent via My Chart Reviewed importance of using DME in the home, working with home health PT/  OT for strengthening and endurance Reviewed safety precautions  Patient Goals/Self-Care Activities: Take medications as prescribed   Attend all scheduled provider appointments Call provider office for new concerns or questions  check blood sugar at prescribed times: once daily check feet daily for cuts, sores or redness enter blood sugar readings and medication or insulin into daily log take the blood sugar log to all doctor visits take the blood sugar meter to all doctor visits fill half of plate with vegetables check blood pressure daily write blood pressure results in a log or diary keep a blood pressure log take blood pressure log to all doctor appointments take medications for blood pressure exactly as prescribed report new symptoms to your doctor Advanced Directives packet mailed, please look over Follow carbohydrate modified and low sodium diet Continue to work with home health PT and OT and complete prescribed exercises daily fall prevention strategies: change position slowly, use assistive device such as walker or cane (per provider recommendations) when walking, keep walkways clear, have good  lighting in room. It is important to contact your provider if you have any falls, maintain muscle strength/tone by exercise per provider recommendations.       Plan:Telephone follow up appointment with care management team member scheduled for:  11/20/2021  Jacqlyn Larsen [facility], BSN RN Case Manager [facility] [phone]

## 2021-10-23 NOTE — Telephone Encounter (Signed)
Therapist Howell Pringle with [facility] called in stating that some of the services that have been out to the pt's home have reported pt as having some high bp's. A previous reading of 172/80 has been documented and today bp reading was 158/82. Mrs. Ronnald Ramp states that pt had a headache at the time of this call and is complaining of some peripheal neuropathy tingling. Mrs. Ronnald Ramp also request a possible assessment for meds for the tingling. She also states that pt has been complaining of some sinus infection issues and ear problems. Mrs. Ronnald Ramp was also wanting pt to get an assessment for meds for these issues as well. Please advise.   Cb#: Howell Pringle [phone]

## 2021-10-25 ENCOUNTER — Encounter: Payer: Self-pay | Admitting: Family Medicine

## 2021-10-26 ENCOUNTER — Other Ambulatory Visit: Payer: Self-pay | Admitting: Family Medicine

## 2021-10-26 DIAGNOSIS — I2510 Atherosclerotic heart disease of native coronary artery without angina pectoris: Secondary | ICD-10-CM | POA: Diagnosis not present

## 2021-10-26 DIAGNOSIS — E1122 Type 2 diabetes mellitus with diabetic chronic kidney disease: Secondary | ICD-10-CM | POA: Diagnosis not present

## 2021-10-26 DIAGNOSIS — N183 Chronic kidney disease, stage 3 unspecified: Secondary | ICD-10-CM | POA: Diagnosis not present

## 2021-10-26 DIAGNOSIS — I129 Hypertensive chronic kidney disease with stage 1 through stage 4 chronic kidney disease, or unspecified chronic kidney disease: Secondary | ICD-10-CM | POA: Diagnosis not present

## 2021-10-26 DIAGNOSIS — I69354 Hemiplegia and hemiparesis following cerebral infarction affecting left non-dominant side: Secondary | ICD-10-CM | POA: Diagnosis not present

## 2021-10-26 DIAGNOSIS — J449 Chronic obstructive pulmonary disease, unspecified: Secondary | ICD-10-CM | POA: Diagnosis not present

## 2021-10-26 MED ORDER — AMLODIPINE BESYLATE 10 MG PO TABS: 10.0000 mg | ORAL_TABLET | Freq: Every day | ORAL | Status: DC

## 2021-10-26 MED ORDER — AMLODIPINE BESYLATE 10 MG PO TABS
10.0000 mg | ORAL_TABLET | Freq: Every day | ORAL | 3 refills | Status: DC
Start: 2021-10-26 — End: 2022-10-25

## 2021-10-27 DIAGNOSIS — M1990 Unspecified osteoarthritis, unspecified site: Secondary | ICD-10-CM | POA: Diagnosis not present

## 2021-10-27 DIAGNOSIS — I69354 Hemiplegia and hemiparesis following cerebral infarction affecting left non-dominant side: Secondary | ICD-10-CM | POA: Diagnosis not present

## 2021-10-27 DIAGNOSIS — I2510 Atherosclerotic heart disease of native coronary artery without angina pectoris: Secondary | ICD-10-CM | POA: Diagnosis not present

## 2021-10-27 DIAGNOSIS — G2581 Restless legs syndrome: Secondary | ICD-10-CM | POA: Diagnosis not present

## 2021-10-27 DIAGNOSIS — E785 Hyperlipidemia, unspecified: Secondary | ICD-10-CM | POA: Diagnosis not present

## 2021-10-27 DIAGNOSIS — F32A Depression, unspecified: Secondary | ICD-10-CM | POA: Diagnosis not present

## 2021-10-27 DIAGNOSIS — Z9852 Vasectomy status: Secondary | ICD-10-CM | POA: Diagnosis not present

## 2021-10-27 DIAGNOSIS — Z9181 History of falling: Secondary | ICD-10-CM | POA: Diagnosis not present

## 2021-10-27 DIAGNOSIS — Z993 Dependence on wheelchair: Secondary | ICD-10-CM | POA: Diagnosis not present

## 2021-10-27 DIAGNOSIS — Z7984 Long term (current) use of oral hypoglycemic drugs: Secondary | ICD-10-CM | POA: Diagnosis not present

## 2021-10-27 DIAGNOSIS — N183 Chronic kidney disease, stage 3 unspecified: Secondary | ICD-10-CM | POA: Diagnosis not present

## 2021-10-27 DIAGNOSIS — I129 Hypertensive chronic kidney disease with stage 1 through stage 4 chronic kidney disease, or unspecified chronic kidney disease: Secondary | ICD-10-CM | POA: Diagnosis not present

## 2021-10-27 DIAGNOSIS — F17210 Nicotine dependence, cigarettes, uncomplicated: Secondary | ICD-10-CM | POA: Diagnosis not present

## 2021-10-27 DIAGNOSIS — F419 Anxiety disorder, unspecified: Secondary | ICD-10-CM | POA: Diagnosis not present

## 2021-10-27 DIAGNOSIS — E1151 Type 2 diabetes mellitus with diabetic peripheral angiopathy without gangrene: Secondary | ICD-10-CM | POA: Diagnosis not present

## 2021-10-27 DIAGNOSIS — K219 Gastro-esophageal reflux disease without esophagitis: Secondary | ICD-10-CM | POA: Diagnosis not present

## 2021-10-27 DIAGNOSIS — J449 Chronic obstructive pulmonary disease, unspecified: Secondary | ICD-10-CM | POA: Diagnosis not present

## 2021-10-27 DIAGNOSIS — E1122 Type 2 diabetes mellitus with diabetic chronic kidney disease: Secondary | ICD-10-CM | POA: Diagnosis not present

## 2021-10-27 DIAGNOSIS — M6281 Muscle weakness (generalized): Secondary | ICD-10-CM | POA: Diagnosis not present

## 2021-10-27 DIAGNOSIS — E46 Unspecified protein-calorie malnutrition: Secondary | ICD-10-CM | POA: Diagnosis not present

## 2021-10-28 ENCOUNTER — Telehealth: Payer: Self-pay

## 2021-10-28 DIAGNOSIS — J449 Chronic obstructive pulmonary disease, unspecified: Secondary | ICD-10-CM | POA: Diagnosis not present

## 2021-10-28 DIAGNOSIS — N183 Chronic kidney disease, stage 3 unspecified: Secondary | ICD-10-CM | POA: Diagnosis not present

## 2021-10-28 DIAGNOSIS — I2510 Atherosclerotic heart disease of native coronary artery without angina pectoris: Secondary | ICD-10-CM | POA: Diagnosis not present

## 2021-10-28 DIAGNOSIS — E1122 Type 2 diabetes mellitus with diabetic chronic kidney disease: Secondary | ICD-10-CM | POA: Diagnosis not present

## 2021-10-28 DIAGNOSIS — I129 Hypertensive chronic kidney disease with stage 1 through stage 4 chronic kidney disease, or unspecified chronic kidney disease: Secondary | ICD-10-CM | POA: Diagnosis not present

## 2021-10-28 DIAGNOSIS — I69354 Hemiplegia and hemiparesis following cerebral infarction affecting left non-dominant side: Secondary | ICD-10-CM | POA: Diagnosis not present

## 2021-10-28 NOTE — Telephone Encounter (Signed)
Leah w/[facility] had visit with pt today. She is concerned about his swallowing ability. She is asking for verbal orders for ST eval/treat. Verbal approval given. She will have her office fax orders for signature.

## 2021-10-29 ENCOUNTER — Other Ambulatory Visit: Payer: Self-pay

## 2021-10-29 ENCOUNTER — Ambulatory Visit (INDEPENDENT_AMBULATORY_CARE_PROVIDER_SITE_OTHER): Payer: Medicare Other | Admitting: Family Medicine

## 2021-10-29 ENCOUNTER — Encounter: Payer: Self-pay | Admitting: Family Medicine

## 2021-10-29 VITALS — BP 138/62 | HR 73 | Temp 97.9°F | Resp 18 | Ht 71.0 in | Wt 172.0 lb

## 2021-10-29 DIAGNOSIS — I2510 Atherosclerotic heart disease of native coronary artery without angina pectoris: Secondary | ICD-10-CM | POA: Diagnosis not present

## 2021-10-29 DIAGNOSIS — N183 Chronic kidney disease, stage 3 unspecified: Secondary | ICD-10-CM | POA: Diagnosis not present

## 2021-10-29 DIAGNOSIS — I129 Hypertensive chronic kidney disease with stage 1 through stage 4 chronic kidney disease, or unspecified chronic kidney disease: Secondary | ICD-10-CM | POA: Diagnosis not present

## 2021-10-29 DIAGNOSIS — E1122 Type 2 diabetes mellitus with diabetic chronic kidney disease: Secondary | ICD-10-CM | POA: Diagnosis not present

## 2021-10-29 DIAGNOSIS — I69354 Hemiplegia and hemiparesis following cerebral infarction affecting left non-dominant side: Secondary | ICD-10-CM | POA: Diagnosis not present

## 2021-10-29 DIAGNOSIS — I10 Essential (primary) hypertension: Secondary | ICD-10-CM | POA: Diagnosis not present

## 2021-10-29 DIAGNOSIS — J449 Chronic obstructive pulmonary disease, unspecified: Secondary | ICD-10-CM | POA: Diagnosis not present

## 2021-10-29 MED ORDER — CLONAZEPAM 0.125 MG PO TBDP: 0.1250 mg | ORAL_TABLET | Freq: Three times a day (TID) | ORAL | 0 refills | Status: DC

## 2021-10-29 MED ORDER — VALSARTAN 160 MG PO TABS: 160.0000 mg | ORAL_TABLET | Freq: Every day | ORAL | 3 refills | Status: DC

## 2021-10-29 NOTE — Progress Notes (Signed)
Subjective:    Patient ID: Barry [name], male    DOB: 07-18-1933, 88 y.o.   MRN: [id number]  HPI Hospitalized 10/10-10/14 with CVA- see DC summary below:   PCP: Susy Frizzle, MD   Disposition:  CIR    Recommendations for Outpatient Follow-up:    Follow up: Neurologist, continue aspirin Plavix, Neurologist regarding uncontrolled restless leg syndrome on this admission Lyrica was added, continue low-dose Klonopin Follow-up with cardiologist to establish and monitoring your aortic stenosis (TEE determined moderate aortic stenosis) Also need to follow-up with cardiologist regarding 30-day Holter monitor placement Small parotid mass-incidental finding, ENT follow-up in next 2 to 3 weeks     Discharge Condition: Stable    Code Status:   Code Status: Full Code   Diet recommendation: Diabetic diet   The patient was seen and examined this morning, remained stable, blood pressure elevated, home medication of HCTZ and metoprolol was changed to Norvasc and Toprol-XL.   Patient stable to be discharged to CIR.     Discharge Diagnoses:      Active Problems:   CVA (cerebral vascular accident) [facility])     History of Present Illness/ Hospital Course Barry [name] Summary:    Barry [name] is a 88 y.o. male with medical history significant for CAD, COPD, type 2 diabetes, hypertension, GERD, allergic rhinitis, and protein calorie malnutrition who presented to the ED with complaints of weakness and vertigo for quite some time now.  He cannot state an exact timeframe, but states that both of his legs were weak bilaterally this morning as he was standing on his porch.  Patient is noted to have bilateral CVA likely cardioembolic in origin.  TEE pending for a.m.  PT/OT evaluation pending with likely need for SNF.     Active Problems:   CVA (cerebral vascular accident) ([place])     Left-sided hemiparesis due to bilateral CVA -Likely cardioembolic in origin, TTE with no acute findings and  LVEF 60-65% -TEE 07/29/2021>> essentially within normal-exception of moderate aortic stenosis negative for any clots -With noted left-sided hemiparesis, ongoing--mild improvement -PT/OT -status post evaluation recommending SNF versus CIR -Neurology consulted recommended dual antiplatelet treatment  - 30-day Holter monitor -Continue Plavix and aspirin as ordered -Hemoglobin A1c 6.7% and LDL 84   Right-sided parotid mass -Incidental finding which will require ENT follow-up in outpatient setting   Aortic stenosis-moderate  -Incidental finding on TEE -Recommended continue close monitoring with cardiologist as an outpatient -Continue currently recommended medication   CKD stage IIIa -Baseline creatinine 1.1-1.3 -Continue to monitor   Hypertension -BP meds were held for permissive hypertension -Home medication HCTZ and  metoprolol--were changed -Blood pressure remained elevated Norvasc 5 mg, metoprolol XL 25 mg daily was added     CAD -Continue on Plavix, aspirin and statins as ordered     COPD with ongoing tobacco abuse -Counseled on cessation -Bronchodilators as needed   Type 2 diabetes -SSI -A1c 6.7% -Resume diabetic diet, home medication of Amaryl   GERD -Continue Protonix    10/02/21 Several issues today.  #1 patient is currently on aspirin and Plavix for secondary prevention of stroke.  The pattern of the stroke seen on MRI included multiple vascular territories most concerning for an embolic stroke.  Patient's son stated that he wore Holter monitor for 30 days that showed no evidence of atrial fibrillation.  He did have a 30 to 40% carotid artery stenosis seen.  #2 the patient is a diabetic on glimepiride 1/2 mg twice daily.  His blood sugars have been 170-200 recently.  The medicine is not fully controlling his sugars.  He also has chronic kidney disease and cardiovascular disease.  The question is how to better manage his diabetes.  #3 the patient has shown increasing  deconditioning.  He was doing well when he left the hospital intensive inpatient rehab.  He went to stay at a nursing facility for 30 days.  There he was receiving minimal physical therapy on a daily basis per their report.  As a result he has become increasingly weak and deconditioned and is now having a difficult time standing and walking.  He is here today in a wheelchair.  He lives in a two-story house and is unable to go upstairs.  His son therefore installed a stair lift so that he can live at home with his son and his son can care for him.  It is medically necessary for him to have the stair lift in order to be able to go upstairs to bed rooms.  #4 he has left shoulder pain.  It is worse when he lifts his arm above his head.  It hurts to sleep at night.  He has a positive empty can sign.  He has a positive Hawking sign.  He states the pain began after he stretched his arm to change a lawnmower blade prior to his stroke.  He has had constant pain in the shoulder and subacromial space since.  #5 he is on amlodipine and simvastatin.  His LDL was 84 in the hospital.  His goal is less than 70 given his history of stroke.  Therefore he would be better off the simvastatin and switch to something such as Crestor.  #6 there was a coincidental finding of a 1 cm mass in his right parotid gland.  At that time, my plan was: #1 we will continue aspirin and Plavix for secondary prevention of stroke.  Holter monitor was performed to rule out atrial fibrillation.  We discussed a referral to a cardiologist for an implantable loop recorder.  At the present time I do not feel that is necessary.  2.  We will discontinue glimepiride and replace with Farxiga 10 mg a day to better manage his sugars and reduce his cardiovascular risk and provide renal protection.   #3 I will consult physical therapy regarding deconditioning to try to improve his mobility at home.    #4 I will switch simvastatin to Crestor 20 mg a day to reduce  the risk of muscle pain/myositis and improve his LDL cholesterol likely given the fact that he is on amlodipine.    #5, I injected his left shoulder with 2 cc lidocaine, 2 cc of Marcaine, 2 cc of 40 mg/mL Kenalog.  He tolerated the procedure well.  I believe he may have a partial tear in his rotator cuff or at the very least subacromial bursitis.    #6 I will consult ENT regarding the mass seen on CT scan.  10/29/21 Since I last saw the patient, his blood pressure has been running.  Systolic blood pressures have been fluctuating between 140 170 vast majority between 150 and 160.  He denies any chest pain or shortness of breath.  His blood sugars are running around 140 fasting in the morning.  He is requesting a refill on his Klonopin.  He takes 0.125 mg 3 times a day.  His son administers the medication.  Without the medicine, he has uncontrolled anxiety. Past Medical History:  Diagnosis  Date   AAA (abdominal aortic aneurysm) 3-09   4.7 cm   Allergy    rhinitis   Anxiety    Cancer ([place])    skin   Cataract    Depression    Diabetes mellitus    Diverticulosis    GERD (gastroesophageal reflux disease)    Hepatitis    Hx of colonic polyps 08/26/2015   Hyperlipidemia    Hypertension    Nephrolithiasis    Personal history of colonic polyps    adenomas, serrated also   PVD (peripheral vascular disease) ([place])    Tobacco abuse    Past Surgical History:  Procedure Laterality Date   COLONOSCOPY  2006, 2008, 2010, 05/27/2011   numerous adenomas - 13 in 2006, 3, 2008, 2 2012 (up to 1 cm), 4 diminutive adenomas, serrated adenomas 2012. Diverticulosis and hemorrhoids also.   EYE SURGERY     EYE SURGERY Left    Kidney stone operation     lower limb amputation, other toe 5th     percutaneous stent graft repair of infrarenal AAA  11/10   5.6 cm (T. early)   surgical excision basal cell carcinoma     TEE WITHOUT CARDIOVERSION N/A 07/29/2021   Procedure: TRANSESOPHAGEAL ECHOCARDIOGRAM (TEE);   Surgeon: Geralynn Rile, MD;  Location: [facility];  Service: Cardiovascular;  Laterality: N/A;   tympanic eardrum repair     VASECTOMY     Current Outpatient Medications on File Prior to Visit  Medication Sig Dispense Refill   acetaminophen (TYLENOL) 325 MG tablet Take 2 tablets (650 mg total) by mouth every 6 (six) hours as needed for mild pain (or Fever >/= 101).     amLODipine (NORVASC) 10 MG tablet Take 1 tablet (10 mg total) by mouth daily. 90 tablet 3   artificial tears (LACRILUBE) OINT ophthalmic ointment Place into both eyes every 4 (four) hours as needed for dry eyes.     B Complex-C (B-COMPLEX WITH VITAMIN C) tablet Take 1 tablet by mouth daily.     clonazepam (KLONOPIN) 0.125 MG disintegrating tablet Take 1 tablet (0.125 mg total) by mouth daily. 5 tablet 0   clopidogrel (PLAVIX) 75 MG tablet Take 1 tablet (75 mg total) by mouth daily. 90 tablet 3   diclofenac Sodium (VOLTAREN) 1 % GEL Apply 2 g topically 4 (four) times daily.     ezetimibe (ZETIA) 10 MG tablet TAKE 1 TABLET BY MOUTH DAILY 90 tablet 3   FLUoxetine (PROZAC) 20 MG capsule TAKE (1) CAPSULE BY MOUTH EACH MORNING. 90 capsule 2   fluticasone (FLONASE) 50 MCG/ACT nasal spray Place 2 sprays into both nostrils daily. 16 g 6   hydrALAZINE (APRESOLINE) 100 MG tablet Take 1 tablet (100 mg total) by mouth every 8 (eight) hours. 90 tablet 0   metoprolol succinate (TOPROL-XL) 25 MG 24 hr tablet Take 0.5 tablets (12.5 mg total) by mouth daily. 90 tablet 3   montelukast (SINGULAIR) 10 MG tablet Take 1 tablet (10 mg total) by mouth at bedtime. 90 tablet 1   rOPINIRole (REQUIP) 0.25 MG tablet Take 1 tablet (0.25 mg total) by mouth at bedtime. 90 tablet 3   rosuvastatin (CRESTOR) 20 MG tablet Take 1 tablet (20 mg total) by mouth daily. 90 tablet 3   vitamin B-12 (CYANOCOBALAMIN) 1000 MCG tablet Take 1,000 mcg by mouth daily.     magnesium gluconate (MAGONATE) 500 MG tablet Take 0.5 tablets (250 mg total) by mouth at bedtime.  (Patient not taking: Reported on 10/23/2021)  No current facility-administered medications on file prior to visit.   Allergies  Allergen Reactions   Lisinopril-Hydrochlorothiazide Swelling    ZESTRIL   Doxycycline Rash   Social History   Socioeconomic History   Marital status: Widowed    Spouse name: Not on file   Number of children: 2   Years of education: Not on file   Highest education level: Not on file  Occupational History    Employer: RETIRED  Tobacco Use   Smoking status: Every Day    Packs/day: 1.00    Years: 60.00    Pack years: 60.00    Types: Cigarettes   Smokeless tobacco: Never   Tobacco comments:    smokes about 16-20 cigs a day as of 04/04/2013  Vaping Use   Vaping Use: Never used  Substance and Sexual Activity   Alcohol use: No   Drug use: No   Sexual activity: Yes    Partners: Female    Birth control/protection: Surgical    Comment: vasectomy  Other Topics Concern   Not on file  Social History Narrative   HS[employer], 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 retired 24 years - keeping busy. Lives alone. 1- step-daughter, 1 son - '68; 3 grandchildren. does odd-jobs, yard work   Oncologist: Not on [employer] Insecurity: No Food Insecurity   Worried About Charity fundraiser in the Last Year: Never true   Arboriculturist in the Last Year: Never true  Transportation Needs: No Transportation Needs   Lack of Transportation (Medical): No   Lack of Transportation (Non-Medical): No  Physical Activity: Not on file  Stress: Not on file  Social Connections: Not on file  Intimate Partner Violence: Not on file     Review of Systems  All other systems reviewed and are negative.     Objective:   Physical Exam Vitals reviewed.  Constitutional:      General: He is not in acute distress.    Appearance: He is not toxic-appearing.  HENT:     Right Ear: Tympanic membrane and ear canal normal.     Left Ear: Tympanic  membrane is injected and scarred.  Cardiovascular:     Rate and Rhythm: Normal rate.     Heart sounds: Murmur heard.  Pulmonary:     Effort: Pulmonary effort is normal.     Breath sounds: Normal breath sounds. No wheezing.  Musculoskeletal:     Left shoulder: Tenderness and crepitus present. Decreased range of motion. Decreased strength.     Right lower leg: No edema.     Left lower leg: No edema.  Neurological:     General: No focal deficit present.     Mental Status: He is alert and oriented to person, place, and time.     Cranial Nerves: No cranial nerve deficit.     Sensory: No sensory deficit.     Coordination: Coordination normal.     Gait: Gait abnormal.     Deep Tendon Reflexes: Reflexes normal.          Assessment & Plan:  Essential hypertension Patient is on maximum dose amlodipine and hydralazine.  I hesitate to increase metoprolol due to bradycardia.  Therefore I will add valsartan 160 mg daily and recheck blood pressure in 2 weeks.  Also refill Klonopin 0.125 mg disintegrating tablets, 90/month

## 2021-11-02 DIAGNOSIS — I129 Hypertensive chronic kidney disease with stage 1 through stage 4 chronic kidney disease, or unspecified chronic kidney disease: Secondary | ICD-10-CM | POA: Diagnosis not present

## 2021-11-02 DIAGNOSIS — J449 Chronic obstructive pulmonary disease, unspecified: Secondary | ICD-10-CM | POA: Diagnosis not present

## 2021-11-02 DIAGNOSIS — I69354 Hemiplegia and hemiparesis following cerebral infarction affecting left non-dominant side: Secondary | ICD-10-CM | POA: Diagnosis not present

## 2021-11-02 DIAGNOSIS — N183 Chronic kidney disease, stage 3 unspecified: Secondary | ICD-10-CM | POA: Diagnosis not present

## 2021-11-02 DIAGNOSIS — E1122 Type 2 diabetes mellitus with diabetic chronic kidney disease: Secondary | ICD-10-CM | POA: Diagnosis not present

## 2021-11-02 DIAGNOSIS — I2510 Atherosclerotic heart disease of native coronary artery without angina pectoris: Secondary | ICD-10-CM | POA: Diagnosis not present

## 2021-11-03 ENCOUNTER — Other Ambulatory Visit: Payer: Self-pay

## 2021-11-03 ENCOUNTER — Encounter: Payer: Self-pay | Admitting: Cardiology

## 2021-11-03 ENCOUNTER — Ambulatory Visit (INDEPENDENT_AMBULATORY_CARE_PROVIDER_SITE_OTHER): Payer: Medicare Other | Admitting: Cardiology

## 2021-11-03 VITALS — BP 148/72 | HR 57 | Ht 71.0 in | Wt 165.4 lb

## 2021-11-03 DIAGNOSIS — E7800 Pure hypercholesterolemia, unspecified: Secondary | ICD-10-CM | POA: Diagnosis not present

## 2021-11-03 DIAGNOSIS — I7143 Infrarenal abdominal aortic aneurysm, without rupture: Secondary | ICD-10-CM

## 2021-11-03 DIAGNOSIS — I350 Nonrheumatic aortic (valve) stenosis: Secondary | ICD-10-CM | POA: Diagnosis not present

## 2021-11-03 DIAGNOSIS — I10 Essential (primary) hypertension: Secondary | ICD-10-CM

## 2021-11-03 DIAGNOSIS — I2510 Atherosclerotic heart disease of native coronary artery without angina pectoris: Secondary | ICD-10-CM

## 2021-11-03 DIAGNOSIS — F17200 Nicotine dependence, unspecified, uncomplicated: Secondary | ICD-10-CM | POA: Diagnosis not present

## 2021-11-03 NOTE — Patient Instructions (Signed)
°  Follow-Up: °At [facility], you and your health needs are our priority.  As part of our continuing mission to provide you with exceptional heart care, we have created designated Provider Care Teams.  These Care Teams include your primary Cardiologist (physician) and Advanced Practice Providers (APPs -  Physician Assistants and Nurse Practitioners) who all work together to provide you with the care you need, when you need it. ° °We recommend signing up for the patient portal called "MyChart".  Sign up information is provided on this After Visit Summary.  MyChart is used to connect with patients for Virtual Visits (Telemedicine).  Patients are able to view lab/test results, encounter notes, upcoming appointments, etc.  Non-urgent messages can be sent to your provider as well.   °To learn more about what you can do with MyChart, go to [email or web address].   ° °Your next appointment:   °6 month(s) ° °The format for your next appointment:   °In Person ° °Provider:  Brian Crenshaw MD ° °}  ° ° °

## 2021-11-04 ENCOUNTER — Ambulatory Visit: Payer: Medicare Other | Admitting: Diagnostic Neuroimaging

## 2021-11-04 DIAGNOSIS — I2510 Atherosclerotic heart disease of native coronary artery without angina pectoris: Secondary | ICD-10-CM | POA: Diagnosis not present

## 2021-11-04 DIAGNOSIS — E1122 Type 2 diabetes mellitus with diabetic chronic kidney disease: Secondary | ICD-10-CM | POA: Diagnosis not present

## 2021-11-04 DIAGNOSIS — J449 Chronic obstructive pulmonary disease, unspecified: Secondary | ICD-10-CM | POA: Diagnosis not present

## 2021-11-04 DIAGNOSIS — N183 Chronic kidney disease, stage 3 unspecified: Secondary | ICD-10-CM | POA: Diagnosis not present

## 2021-11-04 DIAGNOSIS — I129 Hypertensive chronic kidney disease with stage 1 through stage 4 chronic kidney disease, or unspecified chronic kidney disease: Secondary | ICD-10-CM | POA: Diagnosis not present

## 2021-11-04 DIAGNOSIS — I69354 Hemiplegia and hemiparesis following cerebral infarction affecting left non-dominant side: Secondary | ICD-10-CM | POA: Diagnosis not present

## 2021-11-05 DIAGNOSIS — I129 Hypertensive chronic kidney disease with stage 1 through stage 4 chronic kidney disease, or unspecified chronic kidney disease: Secondary | ICD-10-CM | POA: Diagnosis not present

## 2021-11-05 DIAGNOSIS — J449 Chronic obstructive pulmonary disease, unspecified: Secondary | ICD-10-CM | POA: Diagnosis not present

## 2021-11-05 DIAGNOSIS — E1122 Type 2 diabetes mellitus with diabetic chronic kidney disease: Secondary | ICD-10-CM | POA: Diagnosis not present

## 2021-11-05 DIAGNOSIS — I2510 Atherosclerotic heart disease of native coronary artery without angina pectoris: Secondary | ICD-10-CM | POA: Diagnosis not present

## 2021-11-05 DIAGNOSIS — I69354 Hemiplegia and hemiparesis following cerebral infarction affecting left non-dominant side: Secondary | ICD-10-CM | POA: Diagnosis not present

## 2021-11-05 DIAGNOSIS — N183 Chronic kidney disease, stage 3 unspecified: Secondary | ICD-10-CM | POA: Diagnosis not present

## 2021-11-06 ENCOUNTER — Encounter: Payer: Self-pay | Admitting: Family Medicine

## 2021-11-06 DIAGNOSIS — J449 Chronic obstructive pulmonary disease, unspecified: Secondary | ICD-10-CM | POA: Diagnosis not present

## 2021-11-06 DIAGNOSIS — I2510 Atherosclerotic heart disease of native coronary artery without angina pectoris: Secondary | ICD-10-CM | POA: Diagnosis not present

## 2021-11-06 DIAGNOSIS — E1122 Type 2 diabetes mellitus with diabetic chronic kidney disease: Secondary | ICD-10-CM | POA: Diagnosis not present

## 2021-11-06 DIAGNOSIS — N183 Chronic kidney disease, stage 3 unspecified: Secondary | ICD-10-CM | POA: Diagnosis not present

## 2021-11-06 DIAGNOSIS — I69354 Hemiplegia and hemiparesis following cerebral infarction affecting left non-dominant side: Secondary | ICD-10-CM | POA: Diagnosis not present

## 2021-11-06 DIAGNOSIS — I129 Hypertensive chronic kidney disease with stage 1 through stage 4 chronic kidney disease, or unspecified chronic kidney disease: Secondary | ICD-10-CM | POA: Diagnosis not present

## 2021-11-06 MED ORDER — HYDRALAZINE HCL 100 MG PO TABS: 100.0000 mg | ORAL_TABLET | Freq: Three times a day (TID) | ORAL | 1 refills | Status: DC

## 2021-11-09 DIAGNOSIS — I129 Hypertensive chronic kidney disease with stage 1 through stage 4 chronic kidney disease, or unspecified chronic kidney disease: Secondary | ICD-10-CM | POA: Diagnosis not present

## 2021-11-09 DIAGNOSIS — I69354 Hemiplegia and hemiparesis following cerebral infarction affecting left non-dominant side: Secondary | ICD-10-CM | POA: Diagnosis not present

## 2021-11-09 DIAGNOSIS — J449 Chronic obstructive pulmonary disease, unspecified: Secondary | ICD-10-CM | POA: Diagnosis not present

## 2021-11-09 DIAGNOSIS — E1122 Type 2 diabetes mellitus with diabetic chronic kidney disease: Secondary | ICD-10-CM | POA: Diagnosis not present

## 2021-11-09 DIAGNOSIS — I2510 Atherosclerotic heart disease of native coronary artery without angina pectoris: Secondary | ICD-10-CM | POA: Diagnosis not present

## 2021-11-09 DIAGNOSIS — N183 Chronic kidney disease, stage 3 unspecified: Secondary | ICD-10-CM | POA: Diagnosis not present

## 2021-11-10 DIAGNOSIS — N183 Chronic kidney disease, stage 3 unspecified: Secondary | ICD-10-CM | POA: Diagnosis not present

## 2021-11-10 DIAGNOSIS — I2510 Atherosclerotic heart disease of native coronary artery without angina pectoris: Secondary | ICD-10-CM | POA: Diagnosis not present

## 2021-11-10 DIAGNOSIS — J449 Chronic obstructive pulmonary disease, unspecified: Secondary | ICD-10-CM | POA: Diagnosis not present

## 2021-11-10 DIAGNOSIS — I69354 Hemiplegia and hemiparesis following cerebral infarction affecting left non-dominant side: Secondary | ICD-10-CM | POA: Diagnosis not present

## 2021-11-10 DIAGNOSIS — I129 Hypertensive chronic kidney disease with stage 1 through stage 4 chronic kidney disease, or unspecified chronic kidney disease: Secondary | ICD-10-CM | POA: Diagnosis not present

## 2021-11-10 DIAGNOSIS — E1122 Type 2 diabetes mellitus with diabetic chronic kidney disease: Secondary | ICD-10-CM | POA: Diagnosis not present

## 2021-11-11 ENCOUNTER — Ambulatory Visit (INDEPENDENT_AMBULATORY_CARE_PROVIDER_SITE_OTHER): Payer: Medicare Other | Admitting: Diagnostic Neuroimaging

## 2021-11-11 ENCOUNTER — Encounter: Payer: Self-pay | Admitting: Diagnostic Neuroimaging

## 2021-11-11 VITALS — BP 138/62 | HR 64

## 2021-11-11 DIAGNOSIS — I10 Essential (primary) hypertension: Secondary | ICD-10-CM

## 2021-11-11 DIAGNOSIS — I6381 Other cerebral infarction due to occlusion or stenosis of small artery: Secondary | ICD-10-CM | POA: Diagnosis not present

## 2021-11-11 DIAGNOSIS — I63433 Cerebral infarction due to embolism of bilateral posterior cerebral arteries: Secondary | ICD-10-CM | POA: Diagnosis not present

## 2021-11-11 DIAGNOSIS — E7800 Pure hypercholesterolemia, unspecified: Secondary | ICD-10-CM

## 2021-11-11 DIAGNOSIS — G8194 Hemiplegia, unspecified affecting left nondominant side: Secondary | ICD-10-CM | POA: Diagnosis not present

## 2021-11-11 DIAGNOSIS — I69354 Hemiplegia and hemiparesis following cerebral infarction affecting left non-dominant side: Secondary | ICD-10-CM | POA: Diagnosis not present

## 2021-11-11 DIAGNOSIS — I350 Nonrheumatic aortic (valve) stenosis: Secondary | ICD-10-CM

## 2021-11-11 DIAGNOSIS — E1165 Type 2 diabetes mellitus with hyperglycemia: Secondary | ICD-10-CM

## 2021-11-11 DIAGNOSIS — J449 Chronic obstructive pulmonary disease, unspecified: Secondary | ICD-10-CM | POA: Diagnosis not present

## 2021-11-11 DIAGNOSIS — I129 Hypertensive chronic kidney disease with stage 1 through stage 4 chronic kidney disease, or unspecified chronic kidney disease: Secondary | ICD-10-CM | POA: Diagnosis not present

## 2021-11-11 DIAGNOSIS — E1122 Type 2 diabetes mellitus with diabetic chronic kidney disease: Secondary | ICD-10-CM | POA: Diagnosis not present

## 2021-11-11 DIAGNOSIS — N183 Chronic kidney disease, stage 3 unspecified: Secondary | ICD-10-CM | POA: Diagnosis not present

## 2021-11-11 DIAGNOSIS — I2510 Atherosclerotic heart disease of native coronary artery without angina pectoris: Secondary | ICD-10-CM | POA: Diagnosis not present

## 2021-11-11 NOTE — Progress Notes (Signed)
[facility]  PATIENT: DEMARQUEZ CIOLEK DOB: 1933-06-20  REFERRING CLINICIAN: Susy Frizzle, MD HISTORY FROM: patient  REASON FOR VISIT: new consult    HISTORICAL  CHIEF COMPLAINT:  Chief Complaint  Patient presents with   Cerebrovascular Accident    Rm 6 New Pt son-Darrell  "PT 2 x week; left sided weakness, short term memory"     HISTORY OF PRESENT ILLNESS:   88 year old male here for evaluation of stroke follow-up.  History of hypertension, diabetes, hypercholesterolemia, tobacco abuse, protein calorie malnutrition, coronary artery disease, COPD.  Patient presented to hospital in October 2022 with new onset of left-sided weakness.  Patient was found to have bilateral subcortical infarcts.  Stroke work-up was completed.  Patient was found to have moderate aortic stenosis.  Patient went to inpatient rehab and then skilled nursing facility.  Eventually he was able to go home to his son's house.  Since that time he is getting some home therapy exercises.  Weakness is improving but he still uses a wheelchair and walker most of the time.   REVIEW OF SYSTEMS: Full 14 system review of systems performed and negative with exception of: as per HPI.  ALLERGIES: Allergies  Allergen Reactions   Lisinopril-Hydrochlorothiazide Swelling    ZESTRIL   Doxycycline Rash    HOME MEDICATIONS: Outpatient Medications Prior to Visit  Medication Sig Dispense Refill   acetaminophen (TYLENOL) 325 MG tablet Take 2 tablets (650 mg total) by mouth every 6 (six) hours as needed for mild pain (or Fever >/= 101).     amLODipine (NORVASC) 10 MG tablet Take 1 tablet (10 mg total) by mouth daily. 90 tablet 3   artificial tears (LACRILUBE) OINT ophthalmic ointment Place into both eyes every 4 (four) hours as needed for dry eyes.     B Complex-C (B-COMPLEX WITH VITAMIN C) tablet Take 1 tablet by mouth daily.     clonazepam (KLONOPIN) 0.125 MG disintegrating tablet Take 1 tablet (0.125  mg total) by mouth 3 (three) times daily. 90 tablet 0   clopidogrel (PLAVIX) 75 MG tablet Take 1 tablet (75 mg total) by mouth daily. 90 tablet 3   diclofenac Sodium (VOLTAREN) 1 % GEL Apply 2 g topically 4 (four) times daily.     ezetimibe (ZETIA) 10 MG tablet TAKE 1 TABLET BY MOUTH DAILY 90 tablet 3   FLUoxetine (PROZAC) 20 MG capsule TAKE (1) CAPSULE BY MOUTH EACH MORNING. 90 capsule 2   fluticasone (FLONASE) 50 MCG/ACT nasal spray Place 2 sprays into both nostrils daily. 16 g 6   hydrALAZINE (APRESOLINE) 100 MG tablet Take 1 tablet (100 mg total) by mouth every 8 (eight) hours. 90 tablet 1   metoprolol succinate (TOPROL-XL) 25 MG 24 hr tablet Take 0.5 tablets (12.5 mg total) by mouth daily. 90 tablet 3   montelukast (SINGULAIR) 10 MG tablet Take 1 tablet (10 mg total) by mouth at bedtime. 90 tablet 1   rOPINIRole (REQUIP) 0.25 MG tablet Take 1 tablet (0.25 mg total) by mouth at bedtime. 90 tablet 3   rosuvastatin (CRESTOR) 20 MG tablet Take 1 tablet (20 mg total) by mouth daily. 90 tablet 3   valsartan (DIOVAN) 160 MG tablet Take 1 tablet (160 mg total) by mouth daily. 90 tablet 3   vitamin B-12 (CYANOCOBALAMIN) 1000 MCG tablet Take 1,000 mcg by mouth daily.     magnesium gluconate (MAGONATE) 500 MG tablet Take 0.5 tablets (250 mg total) by mouth at bedtime. (Patient not taking: Reported on 10/23/2021)  No facility-administered medications prior to visit.    PAST MEDICAL HISTORY: Past Medical History:  Diagnosis Date   AAA (abdominal aortic aneurysm) 12/17/2007   4.7 cm   Allergy    rhinitis   Anxiety    Cancer ([place])    skin   Cataract    Depression    Diabetes mellitus    Diverticulosis    GERD (gastroesophageal reflux disease)    Hepatitis    Hx of colonic polyps 08/26/2015   Hyperlipidemia    Hypertension    Nephrolithiasis    Personal history of colonic polyps    adenomas, serrated also   PVD (peripheral vascular disease) ([place])    Stroke ([place])    Tobacco abuse      PAST SURGICAL HISTORY: Past Surgical History:  Procedure Laterality Date   COLONOSCOPY  2006, 2008, 2010, 05/27/2011   numerous adenomas - 13 in 2006, 3, 2008, 2 2012 (up to 1 cm), 4 diminutive adenomas, serrated adenomas 2012. Diverticulosis and hemorrhoids also.   EYE SURGERY     EYE SURGERY Left    Kidney stone operation     lower limb amputation, other toe 5th     percutaneous stent graft repair of infrarenal AAA  11/10   5.6 cm (T. early)   surgical excision basal cell carcinoma     TEE WITHOUT CARDIOVERSION N/A 07/29/2021   Procedure: TRANSESOPHAGEAL ECHOCARDIOGRAM (TEE);  Surgeon: Geralynn Rile, MD;  Location: [facility];  Service: Cardiovascular;  Laterality: N/A;   tympanic eardrum repair     VASECTOMY      FAMILY HISTORY: Family History  Problem Relation Age of Onset   Emphysema Father    Cancer Brother        Lung   Depression Brother    Depression Sister    Lung disease Sister        Fibrosis and died from pneumonia on 03-25-2013   Kidney disease Sister    Diabetes Sister    Learning disabilities Sister    Mental retardation Sister    Heart disease Mother    Cancer Brother    Colon cancer Neg Hx    Dementia Neg Hx    Alcohol abuse Neg Hx    Drug abuse Neg Hx    Paranoid behavior Neg Hx    Schizophrenia Neg Hx    Anxiety disorder Neg Hx    Bipolar disorder Neg Hx    OCD Neg Hx    Sexual abuse Neg Hx    Physical abuse Neg Hx    Seizures Neg Hx    Esophageal cancer Neg Hx    Stomach cancer Neg Hx    Rectal cancer Neg Hx     SOCIAL HISTORY: Social History   Socioeconomic History   Marital status: Widowed    Spouse name: Not on file   Number of children: 2   Years of education: Not on file   Highest education level: Not on file  Occupational History    Employer: RETIRED  Tobacco Use   Smoking status: Former    Packs/day: 1.00    Years: 60.00    Pack years: 60.00    Types: Cigarettes   Smokeless tobacco: Never   Tobacco comments:     Hasn't smoked since he had his stroke   Vaping Use   Vaping Use: Never used  Substance and Sexual Activity   Alcohol use: No   Drug use: No   Sexual activity: Yes  Partners: Female    Birth control/protection: Surgical    Comment: vasectomy  Other Topics Concern   Not on file  Social History Narrative   11/11/21 living with son, [name]   HSG, 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 retired 61 years - keeping busy. Lives alone. 1- step-daughter, 1 son - '68; 3 grandchildren. does odd-jobs, yard work   Oncologist: Not on [employer] Insecurity: No Food Insecurity   Worried About Charity fundraiser in the Last Year: Never true   Arboriculturist in the Last Year: Never true  Transportation Needs: No Transportation Needs   Lack of Transportation (Medical): No   Lack of Transportation (Non-Medical): No  Physical Activity: Not on file  Stress: Not on file  Social Connections: Not on file  Intimate Partner Violence: Not on file     PHYSICAL EXAM  GENERAL EXAM/CONSTITUTIONAL: Vitals:  Vitals:   11/11/21 1504  BP: 138/62  Pulse: 64   There is no height or weight on file to calculate BMI. Wt Readings from Last 3 Encounters:  11/03/21 165 lb 6.4 oz (75 kg)  10/29/21 172 lb (78 kg)  10/05/21 171 lb (77.6 kg)   Patient is in no distress; well developed, nourished and groomed; neck is supple  CARDIOVASCULAR: Examination of carotid arteries is normal; no carotid bruits Regular rate and rhythm, no murmurs Examination of peripheral vascular system by observation and palpation is normal  EYES: Ophthalmoscopic exam of optic discs and posterior segments is normal; no papilledema or hemorrhages No results found.  MUSCULOSKELETAL: Gait, strength, tone, movements noted in Neurologic exam below  NEUROLOGIC: MENTAL STATUS:  No flowsheet data found. awake, alert, oriented to person, place and time recent and remote memory  intact normal attention and concentration language fluent, comprehension intact, naming intact fund of knowledge appropriate  CRANIAL NERVE:  2nd - no papilledema on fundoscopic exam 2nd, 3rd, 4th, 6th - pupils equal and reactive to light, visual fields full to confrontation, extraocular muscles intact, no nystagmus 5th - facial sensation symmetric 7th - facial strength symmetric 8th - hearing intact 9th - palate elevates symmetrically, uvula midline 11th - shoulder shrug symmetric 12th - tongue protrusion midline  MOTOR:  normal bulk and tone, full strength in the BUE, BLE; SLIGHTLY SLOW ON LEFT SIDE  SENSORY:  normal and symmetric to light touch, temperature, vibration  COORDINATION:  finger-nose-finger, fine finger movements normal  REFLEXES:  deep tendon reflexes TRACE and symmetric  GAIT/STATION:  IN WHEELCHAIR     DIAGNOSTIC DATA (LABS, IMAGING, TESTING) - I reviewed patient records, labs, notes, testing and imaging myself where available.  Lab Results  Component Value Date   WBC 9.2 10/05/2021   HGB 13.8 10/05/2021   HCT 42.8 10/05/2021   MCV 95.1 10/05/2021   PLT 302 10/05/2021      Component Value Date/Time   NA 136 10/05/2021 1309   K 4.4 10/05/2021 1309   CL 103 10/05/2021 1309   CO2 24 10/05/2021 1309   GLUCOSE 256 (H) 10/05/2021 1309   BUN 34 (H) 10/05/2021 1309   CREATININE 1.16 10/05/2021 1309   CREATININE 1.16 (H) 03/19/2021 0915   CALCIUM 9.3 10/05/2021 1309   PROT 5.4 (L) 08/03/2021 0543   ALBUMIN 2.4 (L) 08/03/2021 0543   AST 15 08/03/2021 0543   ALT 13 08/03/2021 0543   ALKPHOS 36 (L) 08/03/2021 0543   BILITOT 0.6 08/03/2021 0543   GFRNONAA >60 10/05/2021 1309  GFRNONAA 57 (L) 03/19/2021 0915   GFRAA 66 03/19/2021 0915   Lab Results  Component Value Date   CHOL 164 07/27/2021   HDL 64 07/27/2021   LDLCALC 84 07/27/2021   LDLDIRECT 152.6 05/28/2008   TRIG 81 07/27/2021   CHOLHDL 2.6 07/27/2021   Lab Results  Component  Value Date   HGBA1C 6.7 (H) 07/27/2021   Lab Results  Component Value Date   VITAMINB12 226 11/28/2018   Lab Results  Component Value Date   TSH 1.266 12/21/2018    07/28/21 TTE - Mild to moderate aortic valve stenosis. Aortic valve area,  by VTI measures 1.31 cm. Aortic valve mean gradient measures 14.0 mmHg.   07/29/22 TEE  1. Left ventricular ejection fraction, by estimation, is 60 to 65%. The  left ventricle has normal function.   2. Right ventricular systolic function is normal. The right ventricular  size is normal.   3. No left atrial/left atrial appendage thrombus was detected. The LAA  emptying velocity was 90 cm/s.   4. The mitral valve is grossly normal. Trivial mitral valve  regurgitation. No evidence of mitral stenosis.   5. The aortic valve is tricuspid. There is moderate calcification of the  aortic valve. There is moderate thickening of the aortic valve. Aortic  valve regurgitation is not visualized. Moderate aortic valve stenosis.  Aortic valve area, by VTI measures  1.31 cm. Aortic valve mean gradient measures 23.3 mmHg. Aortic valve Vmax  measures 3.10 m/s.   6. There is Moderate (Grade III) layered plaque involving the descending  aorta and transverse aorta.   7. Agitated saline contrast bubble study was negative, with no evidence  of any interatrial shunt. -No LA/LAA thrombus identified. Negative  bubble study for interatrial shunt. No intracardiac source of embolism  detected on this on this transesophageal echocardiogram.   07/27/21 CTA head / neck 1. Mild atherosclerotic plaque in the bilateral carotid bulbs resulting in up to 30-40% stenosis on the right. Patent vertebral arteries. 2. Calcified atherosclerotic plaque of the bilateral intracranial ICAs resulting in mild to moderate stenosis on the right and mild stenosis on the left, not significantly changed. 3. Mild focal stenosis of the proximal left P2 segment. Otherwise, patent  intracranial vasculature. 4. 1.0 cm enhancing lesion in the right parotid gland is indeterminate with differential including both benign and malignant etiologies. Recommend ENT referral.  07/27/21 MRI brain [I reviewed images myself and agree with interpretation. -VRP]  Acute infarcts in the right posterior lentiform nucleus, thalamus, and tail of the caudate, with additional punctate infarct in the lateral left thalamus and possibly the left lentiform nucleus. Given multiple vascular territories, consider an embolic etiology.    ASSESSMENT AND PLAN  88 y.o. year old male here with:   Dx:  1. Cerebrovascular accident (CVA) due to bilateral embolism of posterior cerebral arteries ([place])   2. Right thalamic infarction ([place])   3. Left hemiparesis ([place])   4. Type 2 diabetes mellitus with hyperglycemia, without long-term current use of insulin (H[facility])   5. Benign essential HTN   6. Aortic valve stenosis, etiology of cardiac valve disease unspecified   7. Pure hypercholesterolemia     PLAN:  STROKE PREVENTION (EMBOLIC; ARTERY-ARTERY VS AORTIC STENOSIS) - continue medical mgmt - continue clopidogrel, crestor, BP control - continue smoking cessation - aortic valve stenosis --> follow up with cardiology  PAROTID MASS - follow up with ENT  Return for return to PCP, pending if symptoms worsen or fail to improve.  I reviewed images, labs, notes, records myself. I summarized findings and reviewed with patient, for this high risk condition (stroke) requiring high complexity decision making.    Penni Bombard, MD 1/85/6314, 9:70 PM Certified in Neurology, Neurophysiology and Neuroimaging  [facility] [address], [place], [place] [address] [phone]

## 2021-11-12 DIAGNOSIS — R0989 Other specified symptoms and signs involving the circulatory and respiratory systems: Secondary | ICD-10-CM | POA: Diagnosis not present

## 2021-11-13 DIAGNOSIS — I129 Hypertensive chronic kidney disease with stage 1 through stage 4 chronic kidney disease, or unspecified chronic kidney disease: Secondary | ICD-10-CM | POA: Diagnosis not present

## 2021-11-13 DIAGNOSIS — J449 Chronic obstructive pulmonary disease, unspecified: Secondary | ICD-10-CM | POA: Diagnosis not present

## 2021-11-13 DIAGNOSIS — I2510 Atherosclerotic heart disease of native coronary artery without angina pectoris: Secondary | ICD-10-CM | POA: Diagnosis not present

## 2021-11-13 DIAGNOSIS — E1122 Type 2 diabetes mellitus with diabetic chronic kidney disease: Secondary | ICD-10-CM | POA: Diagnosis not present

## 2021-11-13 DIAGNOSIS — I69354 Hemiplegia and hemiparesis following cerebral infarction affecting left non-dominant side: Secondary | ICD-10-CM | POA: Diagnosis not present

## 2021-11-13 DIAGNOSIS — N183 Chronic kidney disease, stage 3 unspecified: Secondary | ICD-10-CM | POA: Diagnosis not present

## 2021-11-16 ENCOUNTER — Telehealth: Payer: Self-pay | Admitting: Family Medicine

## 2021-11-16 ENCOUNTER — Encounter: Payer: Self-pay | Admitting: Family Medicine

## 2021-11-16 DIAGNOSIS — E1122 Type 2 diabetes mellitus with diabetic chronic kidney disease: Secondary | ICD-10-CM | POA: Diagnosis not present

## 2021-11-16 DIAGNOSIS — I2510 Atherosclerotic heart disease of native coronary artery without angina pectoris: Secondary | ICD-10-CM | POA: Diagnosis not present

## 2021-11-16 DIAGNOSIS — I129 Hypertensive chronic kidney disease with stage 1 through stage 4 chronic kidney disease, or unspecified chronic kidney disease: Secondary | ICD-10-CM | POA: Diagnosis not present

## 2021-11-16 DIAGNOSIS — J449 Chronic obstructive pulmonary disease, unspecified: Secondary | ICD-10-CM | POA: Diagnosis not present

## 2021-11-16 DIAGNOSIS — I69354 Hemiplegia and hemiparesis following cerebral infarction affecting left non-dominant side: Secondary | ICD-10-CM | POA: Diagnosis not present

## 2021-11-16 DIAGNOSIS — N183 Chronic kidney disease, stage 3 unspecified: Secondary | ICD-10-CM | POA: Diagnosis not present

## 2021-11-16 NOTE — Telephone Encounter (Signed)
Spoke with pt's son [name] [facility]) and gave recommendations. Pt's son also report continued increased BG readings. Advised him he can send readings in via My Chart and we will have Dr. Dennard Schaumann review. He will start increased dose of medication tomorrow. Nothing further needed at this time.

## 2021-11-16 NOTE — Telephone Encounter (Signed)
Received call from [facility] to report patient's b/p readings have been high since she started taking them on 10/13/21. Today's b/p reading is 168/82. Patient has multiple readings of 160/90 and 170/90; patient has been logging b/p readings daily. Call patient for more specific readings.  Please advise at [phone].

## 2021-11-17 ENCOUNTER — Other Ambulatory Visit: Payer: Self-pay | Admitting: Family Medicine

## 2021-11-17 DIAGNOSIS — I129 Hypertensive chronic kidney disease with stage 1 through stage 4 chronic kidney disease, or unspecified chronic kidney disease: Secondary | ICD-10-CM | POA: Diagnosis not present

## 2021-11-17 DIAGNOSIS — N183 Chronic kidney disease, stage 3 unspecified: Secondary | ICD-10-CM | POA: Diagnosis not present

## 2021-11-17 DIAGNOSIS — J449 Chronic obstructive pulmonary disease, unspecified: Secondary | ICD-10-CM | POA: Diagnosis not present

## 2021-11-17 DIAGNOSIS — I69354 Hemiplegia and hemiparesis following cerebral infarction affecting left non-dominant side: Secondary | ICD-10-CM | POA: Diagnosis not present

## 2021-11-17 DIAGNOSIS — I2510 Atherosclerotic heart disease of native coronary artery without angina pectoris: Secondary | ICD-10-CM | POA: Diagnosis not present

## 2021-11-17 DIAGNOSIS — E119 Type 2 diabetes mellitus without complications: Secondary | ICD-10-CM | POA: Diagnosis not present

## 2021-11-17 DIAGNOSIS — E1122 Type 2 diabetes mellitus with diabetic chronic kidney disease: Secondary | ICD-10-CM | POA: Diagnosis not present

## 2021-11-17 DIAGNOSIS — I10 Essential (primary) hypertension: Secondary | ICD-10-CM | POA: Diagnosis not present

## 2021-11-17 MED ORDER — CLONAZEPAM 0.5 MG PO TABS: 0.2500 mg | ORAL_TABLET | Freq: Three times a day (TID) | ORAL | 1 refills | Status: DC | PRN

## 2021-11-18 DIAGNOSIS — I69354 Hemiplegia and hemiparesis following cerebral infarction affecting left non-dominant side: Secondary | ICD-10-CM | POA: Diagnosis not present

## 2021-11-18 DIAGNOSIS — N183 Chronic kidney disease, stage 3 unspecified: Secondary | ICD-10-CM | POA: Diagnosis not present

## 2021-11-18 DIAGNOSIS — E1122 Type 2 diabetes mellitus with diabetic chronic kidney disease: Secondary | ICD-10-CM | POA: Diagnosis not present

## 2021-11-18 DIAGNOSIS — J449 Chronic obstructive pulmonary disease, unspecified: Secondary | ICD-10-CM | POA: Diagnosis not present

## 2021-11-18 DIAGNOSIS — I129 Hypertensive chronic kidney disease with stage 1 through stage 4 chronic kidney disease, or unspecified chronic kidney disease: Secondary | ICD-10-CM | POA: Diagnosis not present

## 2021-11-18 DIAGNOSIS — I2510 Atherosclerotic heart disease of native coronary artery without angina pectoris: Secondary | ICD-10-CM | POA: Diagnosis not present

## 2021-11-20 ENCOUNTER — Ambulatory Visit (INDEPENDENT_AMBULATORY_CARE_PROVIDER_SITE_OTHER): Payer: Medicare Other | Admitting: *Deleted

## 2021-11-20 DIAGNOSIS — J449 Chronic obstructive pulmonary disease, unspecified: Secondary | ICD-10-CM | POA: Diagnosis not present

## 2021-11-20 DIAGNOSIS — I10 Essential (primary) hypertension: Secondary | ICD-10-CM

## 2021-11-20 DIAGNOSIS — E119 Type 2 diabetes mellitus without complications: Secondary | ICD-10-CM

## 2021-11-20 DIAGNOSIS — E1122 Type 2 diabetes mellitus with diabetic chronic kidney disease: Secondary | ICD-10-CM | POA: Diagnosis not present

## 2021-11-20 DIAGNOSIS — I129 Hypertensive chronic kidney disease with stage 1 through stage 4 chronic kidney disease, or unspecified chronic kidney disease: Secondary | ICD-10-CM | POA: Diagnosis not present

## 2021-11-20 DIAGNOSIS — N183 Chronic kidney disease, stage 3 unspecified: Secondary | ICD-10-CM | POA: Diagnosis not present

## 2021-11-20 DIAGNOSIS — I2510 Atherosclerotic heart disease of native coronary artery without angina pectoris: Secondary | ICD-10-CM | POA: Diagnosis not present

## 2021-11-20 DIAGNOSIS — I69354 Hemiplegia and hemiparesis following cerebral infarction affecting left non-dominant side: Secondary | ICD-10-CM | POA: Diagnosis not present

## 2021-11-20 NOTE — Patient Instructions (Addendum)
Visit Information  Thank you for taking time to visit with me today. Please don't hesitate to contact me if I can be of assistance to you before our next scheduled telephone appointment.  Following are the goals we discussed today:  Take medications as prescribed   Attend all scheduled provider appointments Call provider office for new concerns or questions  check blood sugar at prescribed times: once daily enter blood sugar readings and medication or insulin into daily log take the blood sugar log to all doctor visits take the blood sugar meter to all doctor visits fill half of plate with vegetables manage portion size read food labels for fat, fiber, carbohydrates and portion size wash and dry feet carefully every day check blood pressure daily write blood pressure results in a log or diary keep a blood pressure log take blood pressure log to all doctor appointments take medications for blood pressure exactly as prescribed report new symptoms to your doctor eat more whole grains, fruits and vegetables, lean meats and healthy fats Look over education sent via My Chart- fall prevention Continue carbohydrate modified and low sodium diet Continue to work with home health PT and complete prescribed exercises daily, continue walking fall prevention strategies: change position slowly, use assistive device such as walker or cane (per provider recommendations) when walking, keep walkways clear, have good lighting in room. It is important to contact your provider if you have any falls, maintain muscle strength/tone by exercise per provider recommendations.  Our next appointment is by telephone on 01/08/22 at 9 am  Please call the care guide team at [phone] if you need to cancel or reschedule your appointment.   If you are experiencing a Mental Health or [place] or need someone to talk to, please call the Suicide and Crisis Lifeline: 988 call the [place] National Suicide  Prevention Lifeline: 678 554 0220 or TTY: 425 769 5800 TTY 586-286-8293) to talk to a trained counselor call 1-800-273-TALK (toll free, 24 hour hotline) go to [facility] [address] [place] [phone]) call the [place]: [phone] call 911   Patient verbalizes understanding of instructions and care plan provided today and agrees to view in [place]. Active MyChart status confirmed with patient.    Jacqlyn Larsen RNC, BSN RN Case Manager [facility] [phone] Fall Prevention in the Home, Adult Falls can cause injuries and can happen to people of all ages. There are many things you can do to make your home safe and to help prevent falls. Ask for help when making these changes. What actions can I take to prevent falls? General Instructions Use good lighting in all rooms. Replace any light bulbs that burn out. Turn on the lights in dark areas. Use night-lights. Keep items that you use often in easy-to-reach places. Lower the shelves around your home if needed. Set up your furniture so you have a clear path. Avoid moving your furniture around. Do not have throw rugs or other things on the floor that can make you trip. Avoid walking on wet floors. If any of your floors are uneven, fix them. Add color or contrast paint or tape to clearly mark and help you see: Grab bars or handrails. First and last steps of staircases. Where the edge of each step is. If you use a stepladder: Make sure that it is fully opened. Do not climb a closed stepladder. Make sure the sides of the stepladder are locked in place. Ask someone to hold the  stepladder while you use it. Know where your pets are when moving through your home. What can I do in the bathroom?   Keep the floor dry. Clean up any water on the floor right away. Remove soap buildup in the tub or shower. Use nonskid mats or decals on the floor of the tub or  shower. Attach bath mats securely with double-sided, nonslip rug tape. If you need to sit down in the shower, use a plastic, nonslip stool. Install grab bars by the toilet and in the tub and shower. Do not use towel bars as grab bars. What can I do in the bedroom? Make sure that you have a light by your bed that is easy to reach. Do not use any sheets or blankets for your bed that hang to the floor. Have a firm chair with side arms that you can use for support when you get dressed. What can I do in the kitchen? Clean up any spills right away. If you need to reach something above you, use a step stool with a grab bar. Keep electrical cords out of the way. Do not use floor polish or wax that makes floors slippery. What can I do with my stairs? Do not leave any items on the stairs. Make sure that you have a light switch at the top and the bottom of the stairs. Make sure that there are handrails on both sides of the stairs. Fix handrails that are broken or loose. Install nonslip stair treads on all your stairs. Avoid having throw rugs at the top or bottom of the stairs. Choose a carpet that does not hide the edge of the steps on the stairs. Check carpeting to make sure that it is firmly attached to the stairs. Fix carpet that is loose or worn. What can I do on the outside of my home? Use bright outdoor lighting. Fix the edges of walkways and driveways and fix any cracks. Remove anything that might make you trip as you walk through a door, such as a raised step or threshold. Trim any bushes or trees on paths to your home. Check to see if handrails are loose or broken and that both sides of all steps have handrails. Install guardrails along the edges of any raised decks and porches. Clear paths of anything that can make you trip, such as tools or rocks. Have leaves, snow, or ice cleared regularly. Use sand or salt on paths during winter. Clean up any spills in your garage right away. This  includes grease or oil spills. What other actions can I take? Wear shoes that: Have a low heel. Do not wear high heels. Have rubber bottoms. Feel good on your feet and fit well. Are closed at the toe. Do not wear open-toe sandals. Use tools that help you move around if needed. These include: Canes. Walkers. Scooters. Crutches. Review your medicines with your doctor. Some medicines can make you feel dizzy. This can increase your chance of falling. Ask your doctor what else you can do to help prevent falls. Where to find more information Centers for Disease Control and Prevention, [place]: http://www.wolf.info/ National Institute on Aging: http://kim-miller.com/ Contact a doctor if: You are afraid of falling at home. You feel weak, drowsy, or dizzy at home. You fall at home. Summary There are many simple things that you can do to make your home safe and to help prevent falls. Ways to make your home safe include removing things that can make you trip and  installing grab bars in the bathroom. Ask for help when making these changes in your home. This information is not intended to replace advice given to you by your health care provider. Make sure you discuss any questions you have with your health care provider. Document Revised: 05/07/2020 Document Reviewed: 05/07/2020 Elsevier Patient Education  [place].

## 2021-11-20 NOTE — Chronic Care Management (AMB) (Signed)
Chronic Care Management   CCM RN Visit Note  11/20/2021 Name: Barry [name] MRN: [id number] DOB: September 16, 1933  Subjective: Barry [name] is a 88 y.o. year old male who is a primary care patient of Pickard, Cammie Mcgee, MD. The care management team was consulted for assistance with disease management and care coordination needs.    Engaged with patient by telephone for follow up visit in response to provider referral for case management and/or care coordination services.   Consent to Services:  The patient was given information about Chronic Care Management services, agreed to services, and gave verbal consent prior to initiation of services.  Please see initial visit note for detailed documentation.   Patient agreed to services and verbal consent obtained.   Assessment: Review of patient past medical history, allergies, medications, health status, including review of consultants reports, laboratory and other test data, was performed as part of comprehensive evaluation and provision of chronic care management services.   SDOH (Social Determinants of Health) assessments and interventions performed:    CCM Care Plan  Allergies  Allergen Reactions   Lisinopril-Hydrochlorothiazide Swelling    ZESTRIL   Doxycycline Rash    Outpatient Encounter Medications as of 11/20/2021  Medication Sig Note   acetaminophen (TYLENOL) 325 MG tablet Take 2 tablets (650 mg total) by mouth every 6 (six) hours as needed for mild pain (or Fever >/= 101).    amLODipine (NORVASC) 10 MG tablet Take 1 tablet (10 mg total) by mouth daily.    artificial tears (LACRILUBE) OINT ophthalmic ointment Place into both eyes every 4 (four) hours as needed for dry eyes.    B Complex-C (B-COMPLEX WITH VITAMIN C) tablet Take 1 tablet by mouth daily.    clonazePAM (KLONOPIN) 0.5 MG tablet Take 0.5 tablets (0.25 mg total) by mouth 3 (three) times daily as needed for anxiety.    clopidogrel (PLAVIX) 75 MG tablet Take 1 tablet  (75 mg total) by mouth daily.    diclofenac Sodium (VOLTAREN) 1 % GEL Apply 2 g topically 4 (four) times daily.    ezetimibe (ZETIA) 10 MG tablet TAKE 1 TABLET BY MOUTH DAILY    FLUoxetine (PROZAC) 20 MG capsule TAKE (1) CAPSULE BY MOUTH EACH MORNING.    fluticasone (FLONASE) 50 MCG/ACT nasal spray Place 2 sprays into both nostrils daily.    hydrALAZINE (APRESOLINE) 100 MG tablet Take 1 tablet (100 mg total) by mouth every 8 (eight) hours.    metoprolol succinate (TOPROL-XL) 25 MG 24 hr tablet Take 0.5 tablets (12.5 mg total) by mouth daily.    montelukast (SINGULAIR) 10 MG tablet Take 1 tablet (10 mg total) by mouth at bedtime.    rOPINIRole (REQUIP) 0.25 MG tablet Take 1 tablet (0.25 mg total) by mouth at bedtime.    rosuvastatin (CRESTOR) 20 MG tablet Take 1 tablet (20 mg total) by mouth daily.    valsartan (DIOVAN) 160 MG tablet Take 1 tablet (160 mg total) by mouth daily.    vitamin B-12 (CYANOCOBALAMIN) 1000 MCG tablet Take 1,000 mcg by mouth daily.    magnesium gluconate (MAGONATE) 500 MG tablet Take 0.5 tablets (250 mg total) by mouth at bedtime. (Patient not taking: Reported on 10/23/2021) 11/11/2021: 11/11/21 son has to pick up   No facility-administered encounter medications on file as of 11/20/2021.    Patient Active Problem List   Diagnosis Date Noted   Right thalamic infarction ([place]) 07/31/2021   Stage 3a chronic kidney disease ([place])    Benign essential HTN  Left hemiparesis ([place])    Type 2 diabetes mellitus with hyperglycemia, without long-term current use of insulin (HCC)    CVA (cerebral vascular accident) ([place]) 07/27/2021   Frequent PVCs 03/20/2020   Aortic stenosis 03/20/2020   COPD (chronic obstructive pulmonary disease) ([place]) 03/20/2020   Constipation 04/30/2019   Protein-calorie malnutrition ([place]) 04/30/2019   Anxiety with depression 12/05/2018   Seasonal and perennial allergic rhinitis 08/09/2018   Carotid artery disease ([place]) 11/18/2017   Hx of colonic polyps  08/26/2015   Erectile dysfunction 02/17/2015   AAA (abdominal aortic aneurysm) without rupture ([place]) 05/21/2014   Chest pain 01/31/2014   CAD (coronary artery disease) 12/24/2013   HEARING LOSS, CONDUCTIVE, LEFT 02/23/2010   DIZZINESS, CHRONIC 05/28/2008   PULMONARY NODULE 09/07/2007   Diabetes mellitus type II, controlled ([place]) 09/06/2007   Hyperlipidemia 09/06/2007   TOBACCO ABUSE 09/06/2007   Essential hypertension 09/06/2007   Allergic rhinitis 09/06/2007   GERD 09/06/2007   BASAL CELL CARCINOMA, NOSE 09/06/2007    Conditions to be addressed/monitored:HTN and DMII  Care Plan : RN Care Manager Plan of Care  Updates made by Kassie Mends, RN since 11/20/2021 12:00 AM     Problem: No plan of care established for management of chronic disease states  (HTN, DM2, CVA, COPD)   Priority: High     Long-Range Goal: Development of plan of care for chronic disease management  (HTN, DM2, CVA, COPD)   Start Date: 10/23/2021  Expected End Date: 04/21/2022  Priority: High  Note:   Current Barriers:  Knowledge Deficits related to plan of care for management of HTN and DMII  No Advanced Directives in place- patient's son states did receive advanced directives packet and plans to complete. Spoke with patient's son Kasen Adduci DPR who reports pt is temporarily living with him and his wife, pt eventually wants to go back to his home.  Patient has home health PT working with him after being inpatient rehab after CVA back in early October 2022.  Son reports pt has balance issues, left side deficits with mainly left leg affected which has led to some falls, has DME in the home and stair lift for going up to the shower, has hired help at son's home from 8am-12 noon which has helped tremendously, reports pt is walking some now for exercise, there are deficits with short term memory per son. Son reports blood pressure is checked frequently with varied readings "sometimes good and sometimes high" , recent  readings with systolic ranges 160-737, diastolic 10-62, reports CBG checked once daily with fasting ranges 180-200's and is most likely elevated due to what patient is eating, appetite is very good. Patient PHQ-9=6, pt is on medication, LCSW previously declined.  RNCM Clinical Goal(s):  Patient will verbalize understanding of plan for management of HTN and DMII as evidenced by patient report, review of EHR and  through collaboration with RN Care manager, provider, and care team.   Interventions: 1:1 collaboration with primary care provider regarding development and update of comprehensive plan of care as evidenced by provider attestation and co-signature Inter-disciplinary care team collaboration (see longitudinal plan of care) Evaluation of current treatment plan related to  self management and patient's adherence to plan as established by provider   Diabetes Interventions:  (Status:  New goal. and Goal on track:  Yes.) Long Term Goal Assessed patient's understanding of A1c goal: <7% Reviewed medications with patient and discussed importance of medication adherence Review of patient status, including review of consultants reports,  relevant laboratory and other test results, and medications completed Reinforced carbohydrate modified diet Lab Results  Component Value Date   HGBA1C 6.7 (H) 07/27/2021   Hypertension Interventions:  (Status:  New goal. and Goal on track:  Yes.) Long Term Goal Last practice recorded BP readings:  BP Readings from Last 3 Encounters:  10/05/21 (!) 163/91  10/02/21 (!) 142/78  08/26/21 (!) 147/67  Most recent eGFR/CrCl: No results found for: EGFR  No components found for: CRCL  Evaluation of current treatment plan related to hypertension self management and patient's adherence to plan as established by provider Reviewed medications with patient and discussed importance of compliance Discussed complications of poorly controlled blood pressure such as heart  disease, stroke, circulatory complications, vision complications, kidney impairment, sexual dysfunction  Reviewed importance of using DME in the home, working with home health PT for strengthening and endurance and completing exercises consistently Reinforced safety precautions Education sent via My Chart- fall prevention  Patient Goals/Self-Care Activities: Take medications as prescribed   Attend all scheduled provider appointments Call provider office for new concerns or questions  check blood sugar at prescribed times: once daily enter blood sugar readings and medication or insulin into daily log take the blood sugar log to all doctor visits take the blood sugar meter to all doctor visits fill half of plate with vegetables manage portion size read food labels for fat, fiber, carbohydrates and portion size wash and dry feet carefully every day check blood pressure daily write blood pressure results in a log or diary keep a blood pressure log take blood pressure log to all doctor appointments take medications for blood pressure exactly as prescribed report new symptoms to your doctor eat more whole grains, fruits and vegetables, lean meats and healthy fats Look over education sent via My Chart- fall prevention Continue carbohydrate modified and low sodium diet Continue to work with home health PT and complete prescribed exercises daily, continue walking fall prevention strategies: change position slowly, use assistive device such as walker or cane (per provider recommendations) when walking, keep walkways clear, have good lighting in room. It is important to contact your provider if you have any falls, maintain muscle strength/tone by exercise per provider recommendations.       Plan:Telephone follow up appointment with care management team member scheduled for:  01/08/22  Jacqlyn Larsen [facility], BSN RN Case Manager [facility] [phone]

## 2021-11-24 DIAGNOSIS — N183 Chronic kidney disease, stage 3 unspecified: Secondary | ICD-10-CM | POA: Diagnosis not present

## 2021-11-24 DIAGNOSIS — I69354 Hemiplegia and hemiparesis following cerebral infarction affecting left non-dominant side: Secondary | ICD-10-CM | POA: Diagnosis not present

## 2021-11-24 DIAGNOSIS — I2510 Atherosclerotic heart disease of native coronary artery without angina pectoris: Secondary | ICD-10-CM | POA: Diagnosis not present

## 2021-11-24 DIAGNOSIS — I129 Hypertensive chronic kidney disease with stage 1 through stage 4 chronic kidney disease, or unspecified chronic kidney disease: Secondary | ICD-10-CM | POA: Diagnosis not present

## 2021-11-24 DIAGNOSIS — J449 Chronic obstructive pulmonary disease, unspecified: Secondary | ICD-10-CM | POA: Diagnosis not present

## 2021-11-24 DIAGNOSIS — E1122 Type 2 diabetes mellitus with diabetic chronic kidney disease: Secondary | ICD-10-CM | POA: Diagnosis not present

## 2021-11-25 ENCOUNTER — Encounter: Payer: Self-pay | Admitting: Family Medicine

## 2021-11-26 ENCOUNTER — Other Ambulatory Visit: Payer: Self-pay | Admitting: Family Medicine

## 2021-11-26 DIAGNOSIS — G2581 Restless legs syndrome: Secondary | ICD-10-CM | POA: Diagnosis not present

## 2021-11-26 DIAGNOSIS — E785 Hyperlipidemia, unspecified: Secondary | ICD-10-CM | POA: Diagnosis not present

## 2021-11-26 DIAGNOSIS — F419 Anxiety disorder, unspecified: Secondary | ICD-10-CM | POA: Diagnosis not present

## 2021-11-26 DIAGNOSIS — I2510 Atherosclerotic heart disease of native coronary artery without angina pectoris: Secondary | ICD-10-CM | POA: Diagnosis not present

## 2021-11-26 DIAGNOSIS — Z9181 History of falling: Secondary | ICD-10-CM | POA: Diagnosis not present

## 2021-11-26 DIAGNOSIS — K219 Gastro-esophageal reflux disease without esophagitis: Secondary | ICD-10-CM | POA: Diagnosis not present

## 2021-11-26 DIAGNOSIS — I6340 Cerebral infarction due to embolism of unspecified cerebral artery: Secondary | ICD-10-CM

## 2021-11-26 DIAGNOSIS — E1151 Type 2 diabetes mellitus with diabetic peripheral angiopathy without gangrene: Secondary | ICD-10-CM | POA: Diagnosis not present

## 2021-11-26 DIAGNOSIS — M1990 Unspecified osteoarthritis, unspecified site: Secondary | ICD-10-CM | POA: Diagnosis not present

## 2021-11-26 DIAGNOSIS — F17210 Nicotine dependence, cigarettes, uncomplicated: Secondary | ICD-10-CM | POA: Diagnosis not present

## 2021-11-26 DIAGNOSIS — Z7984 Long term (current) use of oral hypoglycemic drugs: Secondary | ICD-10-CM | POA: Diagnosis not present

## 2021-11-26 DIAGNOSIS — E1122 Type 2 diabetes mellitus with diabetic chronic kidney disease: Secondary | ICD-10-CM | POA: Diagnosis not present

## 2021-11-26 DIAGNOSIS — E46 Unspecified protein-calorie malnutrition: Secondary | ICD-10-CM | POA: Diagnosis not present

## 2021-11-26 DIAGNOSIS — F32A Depression, unspecified: Secondary | ICD-10-CM | POA: Diagnosis not present

## 2021-11-26 DIAGNOSIS — I69354 Hemiplegia and hemiparesis following cerebral infarction affecting left non-dominant side: Secondary | ICD-10-CM | POA: Diagnosis not present

## 2021-11-26 DIAGNOSIS — J449 Chronic obstructive pulmonary disease, unspecified: Secondary | ICD-10-CM | POA: Diagnosis not present

## 2021-11-26 DIAGNOSIS — I129 Hypertensive chronic kidney disease with stage 1 through stage 4 chronic kidney disease, or unspecified chronic kidney disease: Secondary | ICD-10-CM | POA: Diagnosis not present

## 2021-11-26 DIAGNOSIS — N183 Chronic kidney disease, stage 3 unspecified: Secondary | ICD-10-CM | POA: Diagnosis not present

## 2021-11-26 DIAGNOSIS — Z9852 Vasectomy status: Secondary | ICD-10-CM | POA: Diagnosis not present

## 2021-11-26 NOTE — Telephone Encounter (Signed)
Erroneous encounter. Please disregard.

## 2021-12-01 ENCOUNTER — Telehealth (HOSPITAL_COMMUNITY): Payer: Self-pay | Admitting: Psychiatry

## 2021-12-01 DIAGNOSIS — N183 Chronic kidney disease, stage 3 unspecified: Secondary | ICD-10-CM | POA: Diagnosis not present

## 2021-12-01 DIAGNOSIS — J449 Chronic obstructive pulmonary disease, unspecified: Secondary | ICD-10-CM | POA: Diagnosis not present

## 2021-12-01 DIAGNOSIS — I2510 Atherosclerotic heart disease of native coronary artery without angina pectoris: Secondary | ICD-10-CM | POA: Diagnosis not present

## 2021-12-01 DIAGNOSIS — I129 Hypertensive chronic kidney disease with stage 1 through stage 4 chronic kidney disease, or unspecified chronic kidney disease: Secondary | ICD-10-CM | POA: Diagnosis not present

## 2021-12-01 DIAGNOSIS — E1122 Type 2 diabetes mellitus with diabetic chronic kidney disease: Secondary | ICD-10-CM | POA: Diagnosis not present

## 2021-12-01 DIAGNOSIS — I69354 Hemiplegia and hemiparesis following cerebral infarction affecting left non-dominant side: Secondary | ICD-10-CM | POA: Diagnosis not present

## 2021-12-04 DIAGNOSIS — E1122 Type 2 diabetes mellitus with diabetic chronic kidney disease: Secondary | ICD-10-CM | POA: Diagnosis not present

## 2021-12-04 DIAGNOSIS — I69354 Hemiplegia and hemiparesis following cerebral infarction affecting left non-dominant side: Secondary | ICD-10-CM | POA: Diagnosis not present

## 2021-12-04 DIAGNOSIS — N183 Chronic kidney disease, stage 3 unspecified: Secondary | ICD-10-CM | POA: Diagnosis not present

## 2021-12-04 DIAGNOSIS — I129 Hypertensive chronic kidney disease with stage 1 through stage 4 chronic kidney disease, or unspecified chronic kidney disease: Secondary | ICD-10-CM | POA: Diagnosis not present

## 2021-12-04 DIAGNOSIS — J449 Chronic obstructive pulmonary disease, unspecified: Secondary | ICD-10-CM | POA: Diagnosis not present

## 2021-12-04 DIAGNOSIS — I2510 Atherosclerotic heart disease of native coronary artery without angina pectoris: Secondary | ICD-10-CM | POA: Diagnosis not present

## 2021-12-05 ENCOUNTER — Encounter: Payer: Self-pay | Admitting: Family Medicine

## 2021-12-07 ENCOUNTER — Telehealth: Payer: Self-pay | Admitting: Pharmacist

## 2021-12-07 ENCOUNTER — Other Ambulatory Visit: Payer: Self-pay

## 2021-12-07 MED ORDER — DAPAGLIFLOZIN PROPANEDIOL 5 MG PO TABS: 5.0000 mg | ORAL_TABLET | Freq: Every day | ORAL | 11 refills | Status: DC

## 2021-12-07 NOTE — Progress Notes (Addendum)
° ° °  Chronic Care Management Pharmacy Assistant   Name: Barry [name]  MRN: [id number] DOB: 1933/10/12   Reason for Encounter: Follow up on [facility] [phone]) to follow up on patient's PAP application update. Per Rep the patient is not in their system and the application has not been received. Will update CPP on status.      Jobe Gibbon, [place] Pharmacist Assistant  [phone]  14 minutes spent in review, coordination, and documentation.  Reviewed by: Beverly Milch, PharmD Clinical Pharmacist [phone]

## 2021-12-07 NOTE — Telephone Encounter (Signed)
Called and spoke to patient's son and he is headed to the office now to pick up the samples of Farxiga. He appreciated the follow up and advised patient of approval online and to notify us with any questions. To allow about 10 business days to get first shipment but to contact office if any additional samples are needed in the process. Advised patient's son to call is any additional questions or concerns. He voiced understanding./ls,CMA  Jobe Gibbon, [facility] Clinical Pharmacist Assistant  [phone]

## 2021-12-07 NOTE — Telephone Encounter (Signed)
I think he is on the way for samples!

## 2021-12-08 DIAGNOSIS — I69354 Hemiplegia and hemiparesis following cerebral infarction affecting left non-dominant side: Secondary | ICD-10-CM | POA: Diagnosis not present

## 2021-12-08 DIAGNOSIS — N183 Chronic kidney disease, stage 3 unspecified: Secondary | ICD-10-CM | POA: Diagnosis not present

## 2021-12-08 DIAGNOSIS — J449 Chronic obstructive pulmonary disease, unspecified: Secondary | ICD-10-CM | POA: Diagnosis not present

## 2021-12-08 DIAGNOSIS — E1122 Type 2 diabetes mellitus with diabetic chronic kidney disease: Secondary | ICD-10-CM | POA: Diagnosis not present

## 2021-12-08 DIAGNOSIS — I2510 Atherosclerotic heart disease of native coronary artery without angina pectoris: Secondary | ICD-10-CM | POA: Diagnosis not present

## 2021-12-08 DIAGNOSIS — I129 Hypertensive chronic kidney disease with stage 1 through stage 4 chronic kidney disease, or unspecified chronic kidney disease: Secondary | ICD-10-CM | POA: Diagnosis not present

## 2021-12-09 DIAGNOSIS — I69354 Hemiplegia and hemiparesis following cerebral infarction affecting left non-dominant side: Secondary | ICD-10-CM | POA: Diagnosis not present

## 2021-12-09 DIAGNOSIS — N183 Chronic kidney disease, stage 3 unspecified: Secondary | ICD-10-CM | POA: Diagnosis not present

## 2021-12-09 DIAGNOSIS — I2510 Atherosclerotic heart disease of native coronary artery without angina pectoris: Secondary | ICD-10-CM | POA: Diagnosis not present

## 2021-12-09 DIAGNOSIS — J449 Chronic obstructive pulmonary disease, unspecified: Secondary | ICD-10-CM | POA: Diagnosis not present

## 2021-12-09 DIAGNOSIS — I129 Hypertensive chronic kidney disease with stage 1 through stage 4 chronic kidney disease, or unspecified chronic kidney disease: Secondary | ICD-10-CM | POA: Diagnosis not present

## 2021-12-09 DIAGNOSIS — E1122 Type 2 diabetes mellitus with diabetic chronic kidney disease: Secondary | ICD-10-CM | POA: Diagnosis not present

## 2021-12-11 DIAGNOSIS — I129 Hypertensive chronic kidney disease with stage 1 through stage 4 chronic kidney disease, or unspecified chronic kidney disease: Secondary | ICD-10-CM | POA: Diagnosis not present

## 2021-12-11 DIAGNOSIS — N183 Chronic kidney disease, stage 3 unspecified: Secondary | ICD-10-CM | POA: Diagnosis not present

## 2021-12-11 DIAGNOSIS — I69354 Hemiplegia and hemiparesis following cerebral infarction affecting left non-dominant side: Secondary | ICD-10-CM | POA: Diagnosis not present

## 2021-12-11 DIAGNOSIS — I2510 Atherosclerotic heart disease of native coronary artery without angina pectoris: Secondary | ICD-10-CM | POA: Diagnosis not present

## 2021-12-11 DIAGNOSIS — J449 Chronic obstructive pulmonary disease, unspecified: Secondary | ICD-10-CM | POA: Diagnosis not present

## 2021-12-11 DIAGNOSIS — E1122 Type 2 diabetes mellitus with diabetic chronic kidney disease: Secondary | ICD-10-CM | POA: Diagnosis not present

## 2021-12-13 DIAGNOSIS — Z23 Encounter for immunization: Secondary | ICD-10-CM | POA: Diagnosis not present

## 2021-12-15 ENCOUNTER — Encounter (HOSPITAL_COMMUNITY): Payer: Self-pay | Admitting: Psychiatry

## 2021-12-15 ENCOUNTER — Telehealth (INDEPENDENT_AMBULATORY_CARE_PROVIDER_SITE_OTHER): Payer: Medicare Other | Admitting: Psychiatry

## 2021-12-15 ENCOUNTER — Other Ambulatory Visit: Payer: Self-pay

## 2021-12-15 DIAGNOSIS — E1122 Type 2 diabetes mellitus with diabetic chronic kidney disease: Secondary | ICD-10-CM | POA: Diagnosis not present

## 2021-12-15 DIAGNOSIS — I2510 Atherosclerotic heart disease of native coronary artery without angina pectoris: Secondary | ICD-10-CM | POA: Diagnosis not present

## 2021-12-15 DIAGNOSIS — N183 Chronic kidney disease, stage 3 unspecified: Secondary | ICD-10-CM | POA: Diagnosis not present

## 2021-12-15 DIAGNOSIS — F331 Major depressive disorder, recurrent, moderate: Secondary | ICD-10-CM | POA: Diagnosis not present

## 2021-12-15 DIAGNOSIS — J449 Chronic obstructive pulmonary disease, unspecified: Secondary | ICD-10-CM | POA: Diagnosis not present

## 2021-12-15 DIAGNOSIS — I10 Essential (primary) hypertension: Secondary | ICD-10-CM | POA: Diagnosis not present

## 2021-12-15 DIAGNOSIS — I69354 Hemiplegia and hemiparesis following cerebral infarction affecting left non-dominant side: Secondary | ICD-10-CM | POA: Diagnosis not present

## 2021-12-15 DIAGNOSIS — I129 Hypertensive chronic kidney disease with stage 1 through stage 4 chronic kidney disease, or unspecified chronic kidney disease: Secondary | ICD-10-CM | POA: Diagnosis not present

## 2021-12-15 DIAGNOSIS — F411 Generalized anxiety disorder: Secondary | ICD-10-CM | POA: Diagnosis not present

## 2021-12-15 DIAGNOSIS — E119 Type 2 diabetes mellitus without complications: Secondary | ICD-10-CM | POA: Diagnosis not present

## 2021-12-15 MED ORDER — FLUOXETINE HCL 10 MG PO CAPS: 10.0000 mg | ORAL_CAPSULE | Freq: Every day | ORAL | 2 refills | Status: DC

## 2021-12-15 MED ORDER — CLONAZEPAM 0.5 MG PO TABS: 0.2500 mg | ORAL_TABLET | Freq: Three times a day (TID) | ORAL | 1 refills | Status: DC | PRN

## 2021-12-15 MED ORDER — FLUOXETINE HCL 20 MG PO CAPS: ORAL_CAPSULE | ORAL | 2 refills | Status: DC

## 2021-12-15 NOTE — Progress Notes (Signed)
Virtual Visit via Telephone Note  I connected with Barry [name] on 12/15/21 at  1:40 PM EST by telephone and verified that I am speaking with the correct person using two identifiers.  Location: Patient: home Provider: office   I discussed the limitations, risks, security and privacy concerns of performing an evaluation and management service by telephone and the availability of in person appointments. I also discussed with the patient that there may be a patient responsible charge related to this service. The patient expressed understanding and agreed to proceed.      I discussed the assessment and treatment plan with the patient. The patient was provided an opportunity to ask questions and all were answered. The patient agreed with the plan and demonstrated an understanding of the instructions.   The patient was advised to call back or seek an in-person evaluation if the symptoms worsen or if the condition fails to improve as anticipated.  I provided 15 minutes of non-face-to-face time during this encounter.   Barry Spiller, MD  [facility] MD/PA/NP OP Progress Note  12/15/2021 2:04 PM Barry [name]  MRN:  [id number]  Chief Complaint:  Chief Complaint  Patient presents with   Depression   Anxiety   Follow-up   HPI: This patient is an 88 year old widowed white male who is now living with his son in [place].  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  The patient returns for follow-up after about 7 months.  A lot has happened since I last talked to him.  In October of this year he had a right thalamic stroke.  This left him very weak on the left side.  He was hospitalized for a while and then spent several months in a rehab facility.  He was not able to live on his own anymore and is now living with his son for the last 2 months.  The patient states that he is doing "okay".  However at times he gets very depressed about his situation.  He is still  having to get around mostly in a wheelchair and sometimes with a walker.  He is not making as much progress as he would like.  He would very much like to go back home to [place] and mow lawns and be active but his body is not cooperating.  His son states that a couple of times a week he breaks down crying.  He is still sleeping well and his cognition is good and his attention and focus are good.  He is eating well.  He denies suicidal ideation.  His clonazepam has been cut down and he seems okay with this.  Given that he is more depressed recently I suggested that we increase his Prozac dosage and his son agrees. Visit Diagnosis:    ICD-10-CM   1. Generalized anxiety disorder  F41.1     2. Major depressive disorder, recurrent episode, moderate (HCC)  F33.1       Past Psychiatric History: Long-term outpatient treatment for depression and anxiety  Past Medical History:  Past Medical History:  Diagnosis Date   AAA (abdominal aortic aneurysm) 12/17/2007   4.7 cm   Allergy    rhinitis   Anxiety    Cancer ([place])    skin   Cataract    Depression    Diabetes mellitus    Diverticulosis    GERD (gastroesophageal reflux disease)    Hepatitis    Hx of colonic polyps 08/26/2015  Hyperlipidemia    Hypertension    Nephrolithiasis    Personal history of colonic polyps    adenomas, serrated also   PVD (peripheral vascular disease) ([place])    Stroke ([place])    Tobacco abuse     Past Surgical History:  Procedure Laterality Date   COLONOSCOPY  2006, 2008, 2010, 05/27/2011   numerous adenomas - 13 in 2006, 3, 2008, 2 2012 (up to 1 cm), 4 diminutive adenomas, serrated adenomas 2012. Diverticulosis and hemorrhoids also.   EYE SURGERY     EYE SURGERY Left    Kidney stone operation     lower limb amputation, other toe 5th     percutaneous stent graft repair of infrarenal AAA  11/10   5.6 cm (T. early)   surgical excision basal cell carcinoma     TEE WITHOUT CARDIOVERSION N/A 07/29/2021    Procedure: TRANSESOPHAGEAL ECHOCARDIOGRAM (TEE);  Surgeon: Geralynn Rile, MD;  Location: [facility];  Service: Cardiovascular;  Laterality: N/A;   tympanic eardrum repair     VASECTOMY      Family Psychiatric History: see below  Family History:  Family History  Problem Relation Age of Onset   Emphysema Father    Cancer Brother        Lung   Depression Brother    Depression Sister    Lung disease Sister        Fibrosis and died from pneumonia on 04-10-13   Kidney disease Sister    Diabetes Sister    Learning disabilities Sister    Mental retardation Sister    Heart disease Mother    Cancer Brother    Colon cancer Neg Hx    Dementia Neg Hx    Alcohol abuse Neg Hx    Drug abuse Neg Hx    Paranoid behavior Neg Hx    Schizophrenia Neg Hx    Anxiety disorder Neg Hx    Bipolar disorder Neg Hx    OCD Neg Hx    Sexual abuse Neg Hx    Physical abuse Neg Hx    Seizures Neg Hx    Esophageal cancer Neg Hx    Stomach cancer Neg Hx    Rectal cancer Neg Hx     Social History:  Social History   Socioeconomic History   Marital status: Widowed    Spouse name: Not on file   Number of children: 2   Years of education: Not on file   Highest education level: Not on file  Occupational History    Employer: RETIRED  Tobacco Use   Smoking status: Former    Packs/day: 1.00    Years: 60.00    Pack years: 60.00    Types: Cigarettes   Smokeless tobacco: Never   Tobacco comments:    Hasn't smoked since he had his stroke   Vaping Use   Vaping Use: Never used  Substance and Sexual Activity   Alcohol use: No   Drug use: No   Sexual activity: Yes    Partners: Female    Birth control/protection: Surgical    Comment: vasectomy  Other Topics Concern   Not on file  Social History Narrative   11/11/21 living with son, [name]   HSG, 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 retired 34 years - keeping busy. Lives alone. 1- step-daughter, 1 son - '68; 3 grandchildren. does odd-jobs, yard  work   Scientist, physiological Strain: Not on [employer] Insecurity: No Food  Insecurity   Worried About Charity fundraiser in the Last Year: Never true   [place] in the Last Year: Never true  Transportation Needs: No Transportation Needs   Lack of Transportation (Medical): No   Lack of Transportation (Non-Medical): No  Physical Activity: Not on file  Stress: Not on file  Social Connections: Not on file    Allergies:  Allergies  Allergen Reactions   Lisinopril-Hydrochlorothiazide Swelling    ZESTRIL   Doxycycline Rash    Metabolic Disorder Labs: Lab Results  Component Value Date   HGBA1C 6.7 (H) 07/27/2021   MPG 145.59 07/27/2021   MPG 151 03/19/2021   No results found for: PROLACTIN Lab Results  Component Value Date   CHOL 164 07/27/2021   TRIG 81 07/27/2021   HDL 64 07/27/2021   CHOLHDL 2.6 07/27/2021   VLDL 16 07/27/2021   LDLCALC 84 07/27/2021   LDLCALC 58 03/19/2021   Lab Results  Component Value Date   TSH 1.266 12/21/2018   TSH 2.071 10/27/2018    Therapeutic Level Labs: No results found for: LITHIUM No results found for: VALPROATE No components found for:  CBMZ  Current Medications: Current Outpatient Medications  Medication Sig Dispense Refill   FLUoxetine (PROZAC) 10 MG capsule Take 1 capsule (10 mg total) by mouth daily. 90 capsule 2   acetaminophen (TYLENOL) 325 MG tablet Take 2 tablets (650 mg total) by mouth every 6 (six) hours as needed for mild pain (or Fever >/= 101).     amLODipine (NORVASC) 10 MG tablet Take 1 tablet (10 mg total) by mouth daily. 90 tablet 3   artificial tears (LACRILUBE) OINT ophthalmic ointment Place into both eyes every 4 (four) hours as needed for dry eyes.     B Complex-C (B-COMPLEX WITH VITAMIN C) tablet Take 1 tablet by mouth daily.     clonazePAM (KLONOPIN) 0.5 MG tablet Take 0.5 tablets (0.25 mg total) by mouth 3 (three) times daily as needed for anxiety. 60 tablet 1    clopidogrel (PLAVIX) 75 MG tablet Take 1 tablet (75 mg total) by mouth daily. 90 tablet 3   dapagliflozin propanediol (FARXIGA) 5 MG TABS tablet Take 1 tablet (5 mg total) by mouth daily before breakfast. 30 tablet 11   diclofenac Sodium (VOLTAREN) 1 % GEL Apply 2 g topically 4 (four) times daily.     ezetimibe (ZETIA) 10 MG tablet TAKE 1 TABLET BY MOUTH DAILY 90 tablet 3   FLUoxetine (PROZAC) 20 MG capsule TAKE (1) CAPSULE BY MOUTH EACH MORNING. 90 capsule 2   fluticasone (FLONASE) 50 MCG/ACT nasal spray Place 2 sprays into both nostrils daily. 16 g 6   hydrALAZINE (APRESOLINE) 100 MG tablet Take 1 tablet (100 mg total) by mouth every 8 (eight) hours. 90 tablet 1   magnesium gluconate (MAGONATE) 500 MG tablet Take 0.5 tablets (250 mg total) by mouth at bedtime. (Patient not taking: Reported on 10/23/2021)     metoprolol succinate (TOPROL-XL) 25 MG 24 hr tablet Take 0.5 tablets (12.5 mg total) by mouth daily. 90 tablet 3   montelukast (SINGULAIR) 10 MG tablet Take 1 tablet (10 mg total) by mouth at bedtime. 90 tablet 1   rOPINIRole (REQUIP) 0.25 MG tablet Take 1 tablet (0.25 mg total) by mouth at bedtime. 90 tablet 3   rosuvastatin (CRESTOR) 20 MG tablet Take 1 tablet (20 mg total) by mouth daily. 90 tablet 3   valsartan (DIOVAN) 160 MG tablet Take 1 tablet (160 mg total)  by mouth daily. 90 tablet 3   vitamin B-12 (CYANOCOBALAMIN) 1000 MCG tablet Take 1,000 mcg by mouth daily.     No current facility-administered medications for this visit.     Musculoskeletal: Strength & Muscle Tone: na Gait & Station: na Patient leans: N/A  Psychiatric Specialty Exam: Review of Systems  Musculoskeletal:  Positive for gait problem.  Neurological:  Positive for weakness.  Psychiatric/Behavioral:  Positive for dysphoric mood.   All other systems reviewed and are negative.  There were no vitals taken for this visit.There is no height or weight on file to calculate BMI.  General Appearance: NA  Eye  Contact:  NA  Speech:  Clear and Coherent  Volume:  Normal  Mood:  Depressed  Affect:  NA  Thought Process:  Goal Directed  Orientation:  Full (Time, Place, and Person)  Thought Content: Rumination   Suicidal Thoughts:  No  Homicidal Thoughts:  No  Memory:  Immediate;   Good Recent;   Good Remote;   Fair  Judgement:  Fair  Insight:  Shallow  Psychomotor Activity:  Decreased  Concentration:  Concentration: Good and Attention Span: Good  Recall:  Good  Fund of Knowledge: Good  Language: Good  Akathisia:  No  Handed:  Right  AIMS (if indicated): not done  Assets:  Communication Skills Desire for Improvement Resilience Social Support  ADL's:  Impaired  Cognition: WNL  Sleep:  Good   Screenings: PHQ2-9    Flowsheet Row Video Visit from 12/15/2021 in [facility] Management from 10/23/2021 in [place] [facility] Office Visit from 02/13/2021 in [facility] Office Visit from 11/10/2020 in [facility] Office Visit from 02/22/2020 in [facility]  PHQ-2 Total Score 3 3 0 0 0  PHQ-9 Total Score 7 6 2 3  0      Flowsheet Row Video Visit from 12/15/2021 in [facility] ED from 10/05/2021 in [facility] Admission (Discharged) from 07/31/2021 in [facility] No Risk No Risk No Risk        Assessment and Plan: This patient is an 88 year old male with a history of depression and anxiety.  As typically happens his depression is worsened poststroke.  We will need to increase his Prozac to 30 mg daily.  For now he will continue clonazepam 0.25 mg-3 times daily as needed.  He will return to see me in 6 weeks  Collaboration of Care: Collaboration of Care: Primary Care Provider AEB chart notes shared with the PCP through the epic  system  Patient/Guardian was advised Release of Information must be obtained prior to any record release in order to collaborate their care with an outside provider. Patient/Guardian was advised if they have not already done so to contact the registration department to sign all necessary forms in order for [place] to release information regarding their care.   Consent: Patient/Guardian gives verbal consent for treatment and assignment of benefits for services provided during this visit. Patient/Guardian expressed understanding and agreed to proceed.    Barry Spiller, MD 12/15/2021, 2:04 PM

## 2021-12-17 DIAGNOSIS — N183 Chronic kidney disease, stage 3 unspecified: Secondary | ICD-10-CM | POA: Diagnosis not present

## 2021-12-17 DIAGNOSIS — I69354 Hemiplegia and hemiparesis following cerebral infarction affecting left non-dominant side: Secondary | ICD-10-CM | POA: Diagnosis not present

## 2021-12-17 DIAGNOSIS — I2510 Atherosclerotic heart disease of native coronary artery without angina pectoris: Secondary | ICD-10-CM | POA: Diagnosis not present

## 2021-12-17 DIAGNOSIS — J449 Chronic obstructive pulmonary disease, unspecified: Secondary | ICD-10-CM | POA: Diagnosis not present

## 2021-12-17 DIAGNOSIS — E1122 Type 2 diabetes mellitus with diabetic chronic kidney disease: Secondary | ICD-10-CM | POA: Diagnosis not present

## 2021-12-17 DIAGNOSIS — I129 Hypertensive chronic kidney disease with stage 1 through stage 4 chronic kidney disease, or unspecified chronic kidney disease: Secondary | ICD-10-CM | POA: Diagnosis not present

## 2021-12-18 DIAGNOSIS — J449 Chronic obstructive pulmonary disease, unspecified: Secondary | ICD-10-CM | POA: Diagnosis not present

## 2021-12-18 DIAGNOSIS — N183 Chronic kidney disease, stage 3 unspecified: Secondary | ICD-10-CM | POA: Diagnosis not present

## 2021-12-18 DIAGNOSIS — I2510 Atherosclerotic heart disease of native coronary artery without angina pectoris: Secondary | ICD-10-CM | POA: Diagnosis not present

## 2021-12-18 DIAGNOSIS — I69354 Hemiplegia and hemiparesis following cerebral infarction affecting left non-dominant side: Secondary | ICD-10-CM | POA: Diagnosis not present

## 2021-12-18 DIAGNOSIS — I129 Hypertensive chronic kidney disease with stage 1 through stage 4 chronic kidney disease, or unspecified chronic kidney disease: Secondary | ICD-10-CM | POA: Diagnosis not present

## 2021-12-18 DIAGNOSIS — E1122 Type 2 diabetes mellitus with diabetic chronic kidney disease: Secondary | ICD-10-CM | POA: Diagnosis not present

## 2021-12-22 DIAGNOSIS — E1122 Type 2 diabetes mellitus with diabetic chronic kidney disease: Secondary | ICD-10-CM | POA: Diagnosis not present

## 2021-12-22 DIAGNOSIS — J449 Chronic obstructive pulmonary disease, unspecified: Secondary | ICD-10-CM | POA: Diagnosis not present

## 2021-12-22 DIAGNOSIS — I129 Hypertensive chronic kidney disease with stage 1 through stage 4 chronic kidney disease, or unspecified chronic kidney disease: Secondary | ICD-10-CM | POA: Diagnosis not present

## 2021-12-22 DIAGNOSIS — I69354 Hemiplegia and hemiparesis following cerebral infarction affecting left non-dominant side: Secondary | ICD-10-CM | POA: Diagnosis not present

## 2021-12-22 DIAGNOSIS — I2510 Atherosclerotic heart disease of native coronary artery without angina pectoris: Secondary | ICD-10-CM | POA: Diagnosis not present

## 2021-12-22 DIAGNOSIS — N183 Chronic kidney disease, stage 3 unspecified: Secondary | ICD-10-CM | POA: Diagnosis not present

## 2021-12-23 ENCOUNTER — Encounter: Payer: Self-pay | Admitting: Family Medicine

## 2021-12-23 MED ORDER — MONTELUKAST SODIUM 10 MG PO TABS: 10.0000 mg | ORAL_TABLET | Freq: Every day | ORAL | 1 refills | Status: DC

## 2021-12-23 NOTE — Telephone Encounter (Signed)
CHRIS - any info on Farxiga for this pt?  ? ?Thanks! ?

## 2021-12-23 NOTE — Telephone Encounter (Signed)
We are showing this has been delivered.  My CMA will reach out!

## 2021-12-24 ENCOUNTER — Telehealth: Payer: Self-pay | Admitting: Pharmacist

## 2021-12-24 ENCOUNTER — Telehealth: Payer: Self-pay

## 2021-12-24 NOTE — Progress Notes (Signed)
? ? ?  Chronic Care Management ?Pharmacy Assistant  ? ?Name: Barry [name]  MRN: [id number] DOB: 23-Jun-1933 ? ? ?Reason for Encounter: Patient Assistance Documentation ?  ?Medications: ?Outpatient Encounter Medications as of 12/24/2021  ?Medication Sig Note  ? acetaminophen (TYLENOL) 325 MG tablet Take 2 tablets (650 mg total) by mouth every 6 (six) hours as needed for mild pain (or Fever >/= 101).   ? amLODipine (NORVASC) 10 MG tablet Take 1 tablet (10 mg total) by mouth daily.   ? artificial tears (LACRILUBE) OINT ophthalmic ointment Place into both eyes every 4 (four) hours as needed for dry eyes.   ? B Complex-C (B-COMPLEX WITH VITAMIN C) tablet Take 1 tablet by mouth daily.   ? clonazePAM (KLONOPIN) 0.5 MG tablet Take 0.5 tablets (0.25 mg total) by mouth 3 (three) times daily as needed for anxiety.   ? clopidogrel (PLAVIX) 75 MG tablet Take 1 tablet (75 mg total) by mouth daily.   ? dapagliflozin propanediol (FARXIGA) 5 MG TABS tablet Take 1 tablet (5 mg total) by mouth daily before breakfast.   ? diclofenac Sodium (VOLTAREN) 1 % GEL Apply 2 g topically 4 (four) times daily.   ? ezetimibe (ZETIA) 10 MG tablet TAKE 1 TABLET BY MOUTH DAILY   ? FLUoxetine (PROZAC) 10 MG capsule Take 1 capsule (10 mg total) by mouth daily.   ? FLUoxetine (PROZAC) 20 MG capsule TAKE (1) CAPSULE BY MOUTH EACH MORNING.   ? fluticasone (FLONASE) 50 MCG/ACT nasal spray Place 2 sprays into both nostrils daily.   ? hydrALAZINE (APRESOLINE) 100 MG tablet Take 1 tablet (100 mg total) by mouth every 8 (eight) hours.   ? magnesium gluconate (MAGONATE) 500 MG tablet Take 0.5 tablets (250 mg total) by mouth at bedtime. (Patient not taking: Reported on 10/23/2021) 11/11/2021: 11/11/21 son has to pick up  ? metoprolol succinate (TOPROL-XL) 25 MG 24 hr tablet Take 0.5 tablets (12.5 mg total) by mouth daily.   ? montelukast (SINGULAIR) 10 MG tablet Take 1 tablet (10 mg total) by mouth at bedtime.   ? rOPINIRole (REQUIP) 0.25 MG tablet Take 1 tablet  (0.25 mg total) by mouth at bedtime.   ? rosuvastatin (CRESTOR) 20 MG tablet Take 1 tablet (20 mg total) by mouth daily.   ? valsartan (DIOVAN) 160 MG tablet Take 1 tablet (160 mg total) by mouth daily.   ? vitamin B-12 (CYANOCOBALAMIN) 1000 MCG tablet Take 1,000 mcg by mouth daily.   ? ?No facility-administered encounter medications on file as of 12/24/2021.  ? ? ?Patient's son called inquiring on status of [place]. Per online status this application has been approved. This medication status is shipped/delivered. ? ?This medication has been shipped on 12/21/21 - estimated delivery is 12/29/21 tracking number is [id number]. ? ?I called and left a message for Darryl Yazdani/Patient's son. ? ?April D Calhoun, [place] ?Clinical Pharmacist Assistant ?[phone]  ?

## 2021-12-24 NOTE — Telephone Encounter (Signed)
Patient's son, Reita Cliche, has been sending My Chart messages and has now called about patient's Valsartan '160mg'$  rx. He states you specifically told them to "double the dose", and he has been giving patient 1tab in AM and 1tab in PM. Per LOV note 10/29/21 you ADDED this medication to his Metoprolol.  ? ?10/29/21 ?Essential hypertension ?Patient is on maximum dose amlodipine and hydralazine.  I hesitate to increase metoprolol due to bradycardia.  Therefore I will add valsartan 160 mg daily and recheck blood pressure in 2 weeks.   ? ?There is no documentation in chart about doubling this dose. Patient also has not returned for 2 week follow up visit.  ? ?Please clarify, thank you! ?

## 2021-12-25 ENCOUNTER — Other Ambulatory Visit: Payer: Self-pay

## 2021-12-25 ENCOUNTER — Ambulatory Visit (INDEPENDENT_AMBULATORY_CARE_PROVIDER_SITE_OTHER): Payer: Medicare Other

## 2021-12-25 ENCOUNTER — Telehealth: Payer: Self-pay

## 2021-12-25 VITALS — BP 144/80 | Ht 71.0 in | Wt 170.0 lb

## 2021-12-25 DIAGNOSIS — Z0000 Encounter for general adult medical examination without abnormal findings: Secondary | ICD-10-CM | POA: Diagnosis not present

## 2021-12-25 MED ORDER — VALSARTAN 160 MG PO TABS: 160.0000 mg | ORAL_TABLET | Freq: Two times a day (BID) | ORAL | 3 refills | Status: DC

## 2021-12-25 NOTE — Patient Instructions (Signed)
[name] , Thank you for taking time to come for your Medicare Wellness Visit. I appreciate your ongoing commitment to your health goals. Please review the following plan we discussed and let me know if I can assist you in the future.   Screening recommendations/referrals: Colonoscopy: Done 08/20/2015 Repeat no longer required.   Recommended yearly ophthalmology/optometry visit for glaucoma screening and checkup Recommended yearly dental visit for hygiene and checkup  Vaccinations: Influenza vaccine: Done 08/12/2021. Repeat annually  Pneumococcal vaccine: Done 07/18/2010. Second dose due at your convenience.  Tdap vaccine: Done 06/06/2012 Repeat in 10 years  Shingles vaccine: Discussed.   Covid-19: Done 10/30/2019, 11/19/2019, 12/17/2019, 11/18/2020 and 12/13/2021. Advanced directives: Please bring a copy of your health care power of attorney and living will to the office to be added to your chart at your convenience.   Conditions/risks identified: Aim for 30 minutes of exercise or brisk walking, 6-8 glasses of water, and 5 servings of fruits and vegetables each day.   Next appointment: Follow up in one year for your annual wellness visit. 2024.  Preventive Care 29 Years and Older, Male  Preventive care refers to lifestyle choices and visits with your health care provider that can promote health and wellness. What does preventive care include? A yearly physical exam. This is also called an annual well check. Dental exams once or twice a year. Routine eye exams. Ask your health care provider how often you should have your eyes checked. Personal lifestyle choices, including: Daily care of your teeth and gums. Regular physical activity. Eating a healthy diet. Avoiding tobacco and drug use. Limiting alcohol use. Practicing safe sex. Taking low doses of aspirin every day. Taking vitamin and mineral supplements as recommended by your health care provider. What happens during an annual well  check? The services and screenings done by your health care provider during your annual well check will depend on your age, overall health, lifestyle risk factors, and family history of disease. Counseling  Your health care provider may ask you questions about your: Alcohol use. Tobacco use. Drug use. Emotional well-being. Home and relationship well-being. Sexual activity. Eating habits. History of falls. Memory and ability to understand (cognition). Work and work Statistician. Screening  You may have the following tests or measurements: Height, weight, and BMI. Blood pressure. Lipid and cholesterol levels. These may be checked every 5 years, or more frequently if you are over 76 years old. Skin check. Lung cancer screening. You may have this screening every year starting at age 53 if you have a 30-pack-year history of smoking and currently smoke or have quit within the past 15 years. Fecal occult blood test (FOBT) of the stool. You may have this test every year starting at age 10. Flexible sigmoidoscopy or colonoscopy. You may have a sigmoidoscopy every 5 years or a colonoscopy every 10 years starting at age 30. Prostate cancer screening. Recommendations will vary depending on your family history and other risks. Hepatitis C blood test. Hepatitis B blood test. Sexually transmitted disease (STD) testing. Diabetes screening. This is done by checking your blood sugar (glucose) after you have not eaten for a while (fasting). You may have this done every 1-3 years. Abdominal aortic aneurysm (AAA) screening. You may need this if you are a current or former smoker. Osteoporosis. You may be screened starting at age 28 if you are at high risk. Talk with your health care provider about your test results, treatment options, and if necessary, the need for more tests. Vaccines  Your health care provider may recommend certain vaccines, such as: Influenza vaccine. This is recommended every  year. Tetanus, diphtheria, and acellular pertussis (Tdap, Td) vaccine. You may need a Td booster every 10 years. Zoster vaccine. You may need this after age 76. Pneumococcal 13-valent conjugate (PCV13) vaccine. One dose is recommended after age 52. Pneumococcal polysaccharide (PPSV23) vaccine. One dose is recommended after age 32. Talk to your health care provider about which screenings and vaccines you need and how often you need them. This information is not intended to replace advice given to you by your health care provider. Make sure you discuss any questions you have with your health care provider. Document Released: 10/31/2015 Document Revised: 06/23/2016 Document Reviewed: 08/05/2015 Elsevier Interactive Patient Education  2017 [place] Prevention in the Home Falls can cause injuries. They can happen to people of all ages. There are many things you can do to make your home safe and to help prevent falls. What can I do on the outside of my home? Regularly fix the edges of walkways and driveways and fix any cracks. Remove anything that might make you trip as you walk through a door, such as a raised step or threshold. Trim any bushes or trees on the path to your home. Use bright outdoor lighting. Clear any walking paths of anything that might make someone trip, such as rocks or tools. Regularly check to see if handrails are loose or broken. Make sure that both sides of any steps have handrails. Any raised decks and porches should have guardrails on the edges. Have any leaves, snow, or ice cleared regularly. Use sand or salt on walking paths during winter. Clean up any spills in your garage right away. This includes oil or grease spills. What can I do in the bathroom? Use night lights. Install grab bars by the toilet and in the tub and shower. Do not use towel bars as grab bars. Use non-skid mats or decals in the tub or shower. If you need to sit down in the shower, use a  plastic, non-slip stool. Keep the floor dry. Clean up any water that spills on the floor as soon as it happens. Remove soap buildup in the tub or shower regularly. Attach bath mats securely with double-sided non-slip rug tape. Do not have throw rugs and other things on the floor that can make you trip. What can I do in the bedroom? Use night lights. Make sure that you have a light by your bed that is easy to reach. Do not use any sheets or blankets that are too big for your bed. They should not hang down onto the floor. Have a firm chair that has side arms. You can use this for support while you get dressed. Do not have throw rugs and other things on the floor that can make you trip. What can I do in the kitchen? Clean up any spills right away. Avoid walking on wet floors. Keep items that you use a lot in easy-to-reach places. If you need to reach something above you, use a strong step stool that has a grab bar. Keep electrical cords out of the way. Do not use floor polish or wax that makes floors slippery. If you must use wax, use non-skid floor wax. Do not have throw rugs and other things on the floor that can make you trip. What can I do with my stairs? Do not leave any items on the stairs. Make sure that there are  handrails on both sides of the stairs and use them. Fix handrails that are broken or loose. Make sure that handrails are as long as the stairways. Check any carpeting to make sure that it is firmly attached to the stairs. Fix any carpet that is loose or worn. Avoid having throw rugs at the top or bottom of the stairs. If you do have throw rugs, attach them to the floor with carpet tape. Make sure that you have a light switch at the top of the stairs and the bottom of the stairs. If you do not have them, ask someone to add them for you. What else can I do to help prevent falls? Wear shoes that: Do not have high heels. Have rubber bottoms. Are comfortable and fit you  well. Are closed at the toe. Do not wear sandals. If you use a stepladder: Make sure that it is fully opened. Do not climb a closed stepladder. Make sure that both sides of the stepladder are locked into place. Ask someone to hold it for you, if possible. Clearly mark and make sure that you can see: Any grab bars or handrails. First and last steps. Where the edge of each step is. Use tools that help you move around (mobility aids) if they are needed. These include: Canes. Walkers. Scooters. Crutches. Turn on the lights when you go into a dark area. Replace any light bulbs as soon as they burn out. Set up your furniture so you have a clear path. Avoid moving your furniture around. If any of your floors are uneven, fix them. If there are any pets around you, be aware of where they are. Review your medicines with your doctor. Some medicines can make you feel dizzy. This can increase your chance of falling. Ask your doctor what other things that you can do to help prevent falls. This information is not intended to replace advice given to you by your health care provider. Make sure you discuss any questions you have with your health care provider. Document Released: 07/31/2009 Document Revised: 03/11/2016 Document Reviewed: 11/08/2014 Elsevier Interactive Patient Education  2017 [employer].

## 2021-12-25 NOTE — Progress Notes (Signed)
Subjective:   Barry [name] is a 88 y.o. male who presents for Medicare Annual/Subsequent preventive examination. Virtual Visit via Telephone Note  I connected with  JARRIN STALEY on 12/25/21 at  2:00 PM EST by telephone and verified that I am speaking with the correct person using two identifiers.  Location: Patient: HOME Provider: [facility] Persons participating in the virtual visit: patient/Nurse Health Advisor   I discussed the limitations, risks, security and privacy concerns of performing an evaluation and management service by telephone and the availability of in person appointments. The patient expressed understanding and agreed to proceed.  Interactive audio and video telecommunications were attempted between this nurse and patient, however failed, due to patient having technical difficulties OR patient did not have access to video capability.  We continued and completed visit with audio only.  Some vital signs may be absent or patient reported.   Chriss Driver, LPN  Review of Systems     Cardiac Risk Factors include: advanced age (>24mn, >>65women);diabetes mellitus;hypertension;dyslipidemia;male gender;sedentary lifestyle     Objective:    Today's Vitals   12/25/21 1403  Weight: 170 lb (77.1 kg)  Height: '5\' 11"'$  (1.803 m)   Body mass index is 23.71 kg/m.  Advanced Directives 12/25/2021 10/23/2021 10/05/2021 07/31/2021 07/27/2021 05/05/2019 12/21/2018  Does Patient Have a Medical Advance Directive? No No No No No No No  Would patient like information on creating a medical advance directive? No - Patient declined Yes (MAU/Ambulatory/Procedural Areas - Information given) - No - Patient declined No - Patient declined No - Patient declined No - Patient declined    Current Medications (verified) Outpatient Encounter Medications as of 12/25/2021  Medication Sig   acetaminophen (TYLENOL) 325 MG tablet Take 2 tablets (650 mg total) by mouth every 6 (six) hours as  needed for mild pain (or Fever >/= 101).   amLODipine (NORVASC) 10 MG tablet Take 1 tablet (10 mg total) by mouth daily.   artificial tears (LACRILUBE) OINT ophthalmic ointment Place into both eyes every 4 (four) hours as needed for dry eyes.   B Complex-C (B-COMPLEX WITH VITAMIN C) tablet Take 1 tablet by mouth daily.   clonazePAM (KLONOPIN) 0.5 MG tablet Take 0.5 tablets (0.25 mg total) by mouth 3 (three) times daily as needed for anxiety.   clopidogrel (PLAVIX) 75 MG tablet Take 1 tablet (75 mg total) by mouth daily.   dapagliflozin propanediol (FARXIGA) 5 MG TABS tablet Take 1 tablet (5 mg total) by mouth daily before breakfast.   diclofenac Sodium (VOLTAREN) 1 % GEL Apply 2 g topically 4 (four) times daily.   ezetimibe (ZETIA) 10 MG tablet TAKE 1 TABLET BY MOUTH DAILY   FLUoxetine (PROZAC) 10 MG capsule Take 1 capsule (10 mg total) by mouth daily.   FLUoxetine (PROZAC) 20 MG capsule TAKE (1) CAPSULE BY MOUTH EACH MORNING.   fluticasone (FLONASE) 50 MCG/ACT nasal spray Place 2 sprays into both nostrils daily.   hydrALAZINE (APRESOLINE) 100 MG tablet Take 1 tablet (100 mg total) by mouth every 8 (eight) hours.   magnesium gluconate (MAGONATE) 500 MG tablet Take 0.5 tablets (250 mg total) by mouth at bedtime.   metoprolol succinate (TOPROL-XL) 25 MG 24 hr tablet Take 0.5 tablets (12.5 mg total) by mouth daily.   montelukast (SINGULAIR) 10 MG tablet Take 1 tablet (10 mg total) by mouth at bedtime.   rOPINIRole (REQUIP) 0.25 MG tablet Take 1 tablet (0.25 mg total) by mouth at bedtime.   rosuvastatin (CRESTOR)  20 MG tablet Take 1 tablet (20 mg total) by mouth daily.   valsartan (DIOVAN) 160 MG tablet Take 1 tablet (160 mg total) by mouth 2 (two) times daily.   vitamin B-12 (CYANOCOBALAMIN) 1000 MCG tablet Take 1,000 mcg by mouth daily.   [DISCONTINUED] valsartan (DIOVAN) 160 MG tablet Take 1 tablet (160 mg total) by mouth daily.   No facility-administered encounter medications on file as of  12/25/2021.    Allergies (verified) Lisinopril-hydrochlorothiazide and Doxycycline   History: Past Medical History:  Diagnosis Date   AAA (abdominal aortic aneurysm) 12/17/2007   4.7 cm   Allergy    rhinitis   Anxiety    Cancer ([place])    skin   Cataract    Depression    Diabetes mellitus    Diverticulosis    GERD (gastroesophageal reflux disease)    Hepatitis    Hx of colonic polyps 08/26/2015   Hyperlipidemia    Hypertension    Nephrolithiasis    Personal history of colonic polyps    adenomas, serrated also   PVD (peripheral vascular disease) ([place])    Stroke ([place])    Tobacco abuse    Past Surgical History:  Procedure Laterality Date   COLONOSCOPY  2006, 2008, 2010, 05/27/2011   numerous adenomas - 13 in 2006, 3, 2008, 2 2012 (up to 1 cm), 4 diminutive adenomas, serrated adenomas 2012. Diverticulosis and hemorrhoids also.   EYE SURGERY     EYE SURGERY Left    Kidney stone operation     lower limb amputation, other toe 5th     percutaneous stent graft repair of infrarenal AAA  11/10   5.6 cm (T. early)   surgical excision basal cell carcinoma     TEE WITHOUT CARDIOVERSION N/A 07/29/2021   Procedure: TRANSESOPHAGEAL ECHOCARDIOGRAM (TEE);  Surgeon: Geralynn Rile, MD;  Location: [facility];  Service: Cardiovascular;  Laterality: N/A;   tympanic eardrum repair     VASECTOMY     Family History  Problem Relation Age of Onset   Emphysema Father    Cancer Brother        Lung   Depression Brother    Depression Sister    Lung disease Sister        Fibrosis and died from pneumonia on 03-24-2013   Kidney disease Sister    Diabetes Sister    Learning disabilities Sister    Mental retardation Sister    Heart disease Mother    Cancer Brother    Colon cancer Neg Hx    Dementia Neg Hx    Alcohol abuse Neg Hx    Drug abuse Neg Hx    Paranoid behavior Neg Hx    Schizophrenia Neg Hx    Anxiety disorder Neg Hx    Bipolar disorder Neg Hx    OCD Neg Hx    Sexual abuse  Neg Hx    Physical abuse Neg Hx    Seizures Neg Hx    Esophageal cancer Neg Hx    Stomach cancer Neg Hx    Rectal cancer Neg Hx    Social History   Socioeconomic History   Marital status: Widowed    Spouse name: Not on file   Number of children: 2   Years of education: Not on file   Highest education level: Not on file  Occupational History    Employer: RETIRED  Tobacco Use   Smoking status: Former    Packs/day: 1.00    Years: 60.00  Pack years: 60.00    Types: Cigarettes   Smokeless tobacco: Never   Tobacco comments:    Hasn't smoked since he had his stroke   Vaping Use   Vaping Use: Never used  Substance and Sexual Activity   Alcohol use: No   Drug use: No   Sexual activity: Yes    Partners: Female    Birth control/protection: Surgical    Comment: vasectomy  Other Topics Concern   Not on file  Social History Narrative   11/11/21 living with son, [name]   HSG, 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 retired 53 years - keeping busy. Lives alone. 1- step-daughter, 1 son - '68; 3 grandchildren. 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    Difficulty of Paying Living Expenses: Not hard at all  Food Insecurity: No Food Insecurity   Worried About Charity fundraiser in the Last Year: Never true   Arboriculturist in the Last Year: Never true  Transportation Needs: No Transportation Needs   Lack of Transportation (Medical): No   Lack of Transportation (Non-Medical): No  Physical Activity: Sufficiently Active   Days of Exercise per Week: 3 days   Minutes of Exercise per Session: 60 min  Stress: Stress Concern Present   Feeling of Stress : To some extent  Social Connections: Moderately Isolated   Frequency of Communication with Friends and Family: More than three times a week   Frequency of Social Gatherings with Friends and Family: More than three times a week   Attends Religious Services: 1 to 4 times per year    Active Member of [employer] or Organizations: No   Attends Archivist Meetings: Never   Marital Status: Widowed    Tobacco Counseling Counseling given: Not Answered Tobacco comments: Hasn't smoked since he had his stroke    Clinical Intake:  Pre-visit preparation completed: Yes  Pain : No/denies pain     BMI - recorded: 23.71 Nutritional Status: BMI 25 -29 Overweight Nutritional Risks: None Diabetes: Yes  How often do you need to have someone help you when you read instructions, pamphlets, or other written materials from your doctor or pharmacy?: 1 - Never  Diabetic?Nutrition Risk Assessment:  Has the patient had any N/V/D within the last 2 months?  No  Does the patient have any non-healing wounds?  No  Has the patient had any unintentional weight loss or weight gain?  No   Diabetes:  Is the patient diabetic?  Yes  If diabetic, was a CBG obtained today?  No  Did the patient bring in their glucometer from home?  No  Phone visit. How often do you monitor your CBG's? Daily.   Financial Strains and Diabetes Management:  Are you having any financial strains with the device, your supplies or your medication? No .  Does the patient want to be seen by Chronic Care Management for management of their diabetes?  No  Would the patient like to be referred to a Nutritionist or for Diabetic Management?  No   Diabetic Exams:  Diabetic Eye Exam: Completed 2022. Pt has been advised about the importance in completing this exam Diabetic Foot Exam: Completed 12/15/2020. Pt has been advised about the importance in completing this exam.   Interpreter Needed?: No  Information entered by :: mj Salina Stanfield, lpn   Activities of Daily Living In your present state of health, do you have any difficulty performing the following  activities: 12/25/2021 07/31/2021  Hearing? Tempie Donning  Vision? N N  Difficulty concentrating or making decisions? Y N  Comment Yes per pt's son, since CVA -  Walking or  climbing stairs? Y Y  Dressing or bathing? Y Y  Doing errands, shopping? Y N  Preparing Food and eating ? Y -  Using the Toilet? N -  In the past six months, have you accidently leaked urine? N -  Do you have problems with loss of bowel control? N -  Managing your Medications? Y -  Managing your Finances? Y -  Housekeeping or managing your Housekeeping? Y -  Some recent data might be hidden    Patient Care Team: Susy Frizzle, MD as PCP - General (Family Medicine) Early, Arvilla Meres, MD (Vascular Surgery) Sherlynn Stalls, MD (Ophthalmology) Allyn Kenner, MD (Dermatology) Josue Hector, MD (Cardiology) Edythe Clarity, [facility] as Pharmacist (Pharmacist) Cloria Spring, MD as Consulting Physician ([place]) Kassie Mends, RN as Case Manager  Indicate any recent Medical Services you may have received from other than [facility] providers in the past year (date may be approximate).     Assessment:   This is a routine wellness examination for [place].  Hearing/Vision screen Hearing Screening - Comments:: HOH. Vision Screening - Comments:: Glasses. 2022. [facility].  Dietary issues and exercise activities discussed: Current Exercise Habits: Structured exercise class (IN home PT.), Type of exercise: stretching;Other - see comments (In home PT.), Time (Minutes): 60, Frequency (Times/Week): 3, Weekly Exercise (Minutes/Week): 180, Intensity: Mild, Exercise limited by: cardiac condition(s);neurologic condition(s);orthopedic condition(s)   Goals Addressed             This Visit's Progress    Exercise 3x per week (30 min per time)       Continue PT to strengthen legs.        Depression Screen PHQ 2/9 Scores 12/25/2021 10/23/2021 10/23/2021 02/13/2021 11/10/2020 02/22/2020 02/22/2020  PHQ - 2 Score '2 3 4 '$ 0 0 0 0  PHQ- 9 Score 2 6 - 2 3 0 -  Some encounter information is confidential and restricted. Go to Review Flowsheets activity to see all data.    Fall Risk Fall Risk   12/25/2021 11/11/2021 10/23/2021 02/13/2021 11/10/2020  Falls in the past year? '1 1 1 '$ 0 0  Comment - - - - -  Number falls in past yr: '1 1 1 '$ - -  Injury with Fall? 0 0 0 - -  Risk for fall due to : History of fall(s);Impaired balance/gait;Impaired mobility - - Impaired mobility;Impaired balance/gait No Fall Risks  Follow up Falls prevention discussed - - Falls evaluation completed Falls evaluation completed    FALL RISK PREVENTION PERTAINING TO THE HOME:  Any stairs in or around the home? Yes  If so, are there any without handrails? Yes  Home free of loose throw rugs in walkways, pet beds, electrical cords, etc? Yes  Adequate lighting in your home to reduce risk of falls? Yes   ASSISTIVE DEVICES UTILIZED TO PREVENT FALLS:  Life alert? No  Use of a cane, walker or w/c? Yes  Grab bars in the bathroom? Yes  Shower chair or bench in shower? Yes  Elevated toilet seat or a handicapped toilet? No   TIMED UP AND GO:  Was the test performed? No .  PHONE VISIT  Cognitive Function: Test not performed due to issues with pt's hearing. Pt's son, Reita Cliche, states that pt does have issues with memory since CVA.  Immunizations Immunization History  Administered Date(s) Administered   Fluad Quad(high Dose 65+) 07/12/2019, 12/15/2020, 08/12/2021   H1N1 10/15/2008   Influenza Split 07/12/2011, 08/18/2012   Influenza Whole 09/25/2007, 08/23/2008, 08/25/2010   Influenza, High Dose Seasonal PF 08/01/2013, 08/16/2014, 09/05/2018   Influenza-Unspecified 08/05/2015, 08/28/2016, 09/02/2017   Moderna Sars-Covid-2 Vaccination 11/19/2019, 12/17/2019, 11/18/2020   Pneumococcal Conjugate-13 02/17/2015   Td 06/06/2012    TDAP status: Up to date  Flu Vaccine status: Up to date  Pneumococcal vaccine status: Up to date  Covid-19 vaccine status: Completed vaccines  Qualifies for Shingles Vaccine? Yes   Zostavax completed No   Shingrix Completed?: No.    Education has been provided regarding the  importance of this vaccine. Patient has been advised to call insurance company to determine out of pocket expense if they have not yet received this vaccine. Advised may also receive vaccine at local pharmacy or Health Dept. Verbalized acceptance and understanding.  Screening Tests Health Maintenance  Topic Date Due   Zoster Vaccines- Shingrix (1 of 2) Never done   Pneumonia Vaccine 28+ Years old (2 - PPSV23 if available, else PCV20) 02/17/2016   COVID-19 Vaccine (4 - Booster for Moderna series) 01/13/2021   FOOT EXAM  12/15/2021   HEMOGLOBIN A1C  01/25/2022   OPHTHALMOLOGY EXAM  03/03/2022   TETANUS/TDAP  06/06/2022   INFLUENZA VACCINE  Completed   HPV VACCINES  Aged Out   COLONOSCOPY (Pts 45-75yr Insurance coverage will need to be confirmed)  Discontinued    Health Maintenance  Health Maintenance Due  Topic Date Due   Zoster Vaccines- Shingrix (1 of 2) Never done   Pneumonia Vaccine 86 Years old (2 - PPSV23 if available, else PCV20) 02/17/2016   COVID-19 Vaccine (4 - Booster for Moderna series) 01/13/2021   FOOT EXAM  12/15/2021    Colorectal cancer screening: No longer required.   Lung Cancer Screening: (Low Dose CT Chest recommended if Age 86-80years, 30 pack-year currently smoking OR have quit w/in 15years.) does not qualify.  Additional Screening:  Hepatitis C Screening: does not qualify.  Vision Screening: Recommended annual ophthalmology exams for early detection of glaucoma and other disorders of the eye. Is the patient up to date with their annual eye exam?  Yes  Who is the provider or what is the name of the office in which the patient attends annual [facility] exams[facility] a provider, would they like to be referred to a provider to establish care? No .   Dental Screening: Recommended annual dental exams for proper oral hygiene  Community Resource Referral / Chronic Care Management: CRR required this visit?  No   CCM  required this visit?  No      Plan:     I have personally reviewed and noted the following in the patients chart:   Medical and social history Use of alcohol, tobacco or illicit drugs  Current medications and supplements including opioid prescriptions. Patient is not currently taking opioid prescriptions. Functional ability and status Nutritional status Physical activity Advanced directives List of other physicians Hospitalizations, surgeries, and ER visits in previous 12 months Vitals Screenings to include cognitive, depression, and falls Referrals and appointments  In addition, I have reviewed and discussed with patient certain preventive protocols, quality metrics, and best practice recommendations. A written personalized care plan for preventive services as well as general preventive health recommendations were provided to patient.     MChriss Driver LPN  12/25/2021   Nurse Notes: Visit completed with pt's son Darryl. Pt is HOH. Darryl states pt's BP is running in the 140's/60-80's.Discussed shingrix vaccine and how to obtain.

## 2021-12-25 NOTE — Telephone Encounter (Signed)
Please see My Chart message for further info.  ?

## 2021-12-25 NOTE — Telephone Encounter (Signed)
Talked with pt's son, Darryl for pt's AWV today. Darryl states when he talked with you regarding his dad's meds, you mentioned getting pre-packaged medications. What would Darryl need to do to get his dad started with that? Thank you!  ?

## 2021-12-26 DIAGNOSIS — Z9852 Vasectomy status: Secondary | ICD-10-CM | POA: Diagnosis not present

## 2021-12-26 DIAGNOSIS — I69354 Hemiplegia and hemiparesis following cerebral infarction affecting left non-dominant side: Secondary | ICD-10-CM | POA: Diagnosis not present

## 2021-12-26 DIAGNOSIS — E785 Hyperlipidemia, unspecified: Secondary | ICD-10-CM | POA: Diagnosis not present

## 2021-12-26 DIAGNOSIS — F419 Anxiety disorder, unspecified: Secondary | ICD-10-CM | POA: Diagnosis not present

## 2021-12-26 DIAGNOSIS — I2510 Atherosclerotic heart disease of native coronary artery without angina pectoris: Secondary | ICD-10-CM | POA: Diagnosis not present

## 2021-12-26 DIAGNOSIS — G2581 Restless legs syndrome: Secondary | ICD-10-CM | POA: Diagnosis not present

## 2021-12-26 DIAGNOSIS — Z7984 Long term (current) use of oral hypoglycemic drugs: Secondary | ICD-10-CM | POA: Diagnosis not present

## 2021-12-26 DIAGNOSIS — K219 Gastro-esophageal reflux disease without esophagitis: Secondary | ICD-10-CM | POA: Diagnosis not present

## 2021-12-26 DIAGNOSIS — M1990 Unspecified osteoarthritis, unspecified site: Secondary | ICD-10-CM | POA: Diagnosis not present

## 2021-12-26 DIAGNOSIS — E1122 Type 2 diabetes mellitus with diabetic chronic kidney disease: Secondary | ICD-10-CM | POA: Diagnosis not present

## 2021-12-26 DIAGNOSIS — E46 Unspecified protein-calorie malnutrition: Secondary | ICD-10-CM | POA: Diagnosis not present

## 2021-12-26 DIAGNOSIS — Z9181 History of falling: Secondary | ICD-10-CM | POA: Diagnosis not present

## 2021-12-26 DIAGNOSIS — F32A Depression, unspecified: Secondary | ICD-10-CM | POA: Diagnosis not present

## 2021-12-26 DIAGNOSIS — F17210 Nicotine dependence, cigarettes, uncomplicated: Secondary | ICD-10-CM | POA: Diagnosis not present

## 2021-12-26 DIAGNOSIS — J449 Chronic obstructive pulmonary disease, unspecified: Secondary | ICD-10-CM | POA: Diagnosis not present

## 2021-12-26 DIAGNOSIS — N183 Chronic kidney disease, stage 3 unspecified: Secondary | ICD-10-CM | POA: Diagnosis not present

## 2021-12-26 DIAGNOSIS — I129 Hypertensive chronic kidney disease with stage 1 through stage 4 chronic kidney disease, or unspecified chronic kidney disease: Secondary | ICD-10-CM | POA: Diagnosis not present

## 2021-12-26 DIAGNOSIS — E1151 Type 2 diabetes mellitus with diabetic peripheral angiopathy without gangrene: Secondary | ICD-10-CM | POA: Diagnosis not present

## 2021-12-28 NOTE — Telephone Encounter (Signed)
I will have someone call him to set this up!!

## 2021-12-28 NOTE — Progress Notes (Unsigned)
Chronic Care Management Pharmacy Note   Summary: ***  12/28/2021 Name:  Barry [name] MRN:  [id number] DOB:  [birth year]  Subjective: Barry [name] is an 88 y.o. year old male who is a primary patient of Pickard, Cammie Mcgee, MD.  The CCM team was consulted for assistance with disease management and care coordination needs.    Engaged with patient by telephone for follow up visit in response to provider referral for pharmacy case management and/or care coordination services.   Consent to Services:  The patient was given the following information about Chronic Care Management services today, agreed to services, and gave verbal consent: 1. CCM service includes personalized support from designated clinical staff supervised by the primary care provider, including individualized plan of care and coordination with other care providers 2. 24/7 contact phone numbers for assistance for urgent and routine care needs. 3. Service will only be billed when office clinical staff spend 20 minutes or more in a month to coordinate care. 4. Only one practitioner may furnish and bill the service in a calendar month. 5.The patient may stop CCM services at any time (effective at the end of the month) by phone call to the office staff. 6. The patient will be responsible for cost sharing (co-pay) of up to 20% of the service fee (after annual deductible is met). Patient agreed to services and consent obtained.  Patient Care Team: Susy Frizzle, MD as PCP - General (Family Medicine) Early, Arvilla Meres, MD (Vascular Surgery) Sherlynn Stalls, MD (Ophthalmology) Allyn Kenner, MD (Dermatology) Josue Hector, MD (Cardiology) Edythe Clarity, [facility] as Pharmacist (Pharmacist) Cloria Spring, MD as Consulting Physician ([place]) Kassie Mends, RN as Case Manager  Recent office visits:  03/09/21 Dr. Dennard Schaumann For follow-up. No medication changes.    Recent consult visits:  04/02/21 (Video Visit)  [place] Cloria Spring, MD. For generalized anxiety.    Hospital visits:  07/27/21 (ED - Hospital) - bilateral CVA, metoprolol and amlodipine were added due to elevated BP  Objective:  Lab Results  Component Value Date   CREATININE 1.16 10/05/2021   BUN 34 (H) 10/05/2021   GFR 76.82 07/10/2013   GFRNONAA >60 10/05/2021   GFRAA 66 03/19/2021   NA 136 10/05/2021   K 4.4 10/05/2021   CALCIUM 9.3 10/05/2021   CO2 24 10/05/2021    Lab Results  Component Value Date/Time   HGBA1C 6.7 (H) 07/27/2021 12:31 PM   HGBA1C 6.9 (H) 03/19/2021 09:15 AM   GFR 76.82 07/10/2013 12:13 PM   GFR 69.02 06/06/2012 10:45 AM   MICROALBUR 232.9 03/19/2021 09:15 AM   MICROALBUR 25.6 11/17/2017 08:38 AM    Last diabetic Eye exam:  Lab Results  Component Value Date/Time   HMDIABEYEEXA Retinopathy (A) 03/03/2021 10:05 AM    Last diabetic Foot exam:  Lab Results  Component Value Date/Time   HMDIABFOOTEX yes 04/29/2009 12:00 AM     Lab Results  Component Value Date   CHOL 164 07/27/2021   HDL 64 07/27/2021   LDLCALC 84 07/27/2021   LDLDIRECT 152.6 05/28/2008   TRIG 81 07/27/2021   CHOLHDL 2.6 07/27/2021    Hepatic Function Latest Ref Rng & Units 08/03/2021 07/27/2021 03/19/2021  Total Protein 6.5 - 8.1 g/dL 5.4(L) 7.9 5.8(L)  Albumin 3.5 - 5.0 g/dL 2.4(L) 4.4 -  AST 15 - 41 U/L '15 25 18  ' ALT 0 - 44 U/L '13 17 12  ' Alk Phosphatase 38 - 126 U/L 36(L) 49 -  Total Bilirubin 0.3 - 1.2 mg/dL 0.6 0.5 0.3  Bilirubin, Direct <=0.2 mg/dL - - -    Lab Results  Component Value Date/Time   TSH 1.266 12/21/2018 07:21 AM   TSH 2.071 10/27/2018 06:58 AM   TSH 2.080 02/05/2015 08:17 AM   TSH 1.59 06/06/2012 10:45 AM    CBC Latest Ref Rng & Units 10/05/2021 08/03/2021 07/31/2021  WBC 4.0 - 10.5 K/uL 9.2 9.5 8.2  Hemoglobin 13.0 - 17.0 g/dL 13.8 13.5 14.2  Hematocrit 39.0 - 52.0 % 42.8 40.1 44.5  Platelets 150 - 400 K/uL 302 231 200    Lab Results  Component Value Date/Time   VD25OH  23.10 (L) 08/04/2021 05:20 AM    Clinical ASCVD: Yes  The ASCVD Risk score (Arnett DK, et al., 2019) failed to calculate for the following reasons:   The 2019 ASCVD risk score is only valid for ages 42 to 36   The patient has a prior MI or stroke diagnosis    Depression screen [facility] 2/9 12/25/2021 12/15/2021 10/23/2021  Decreased Interest 0 1 0  Down, Depressed, Hopeless '2 2 3  ' PHQ - 2 Score '2 3 3  ' Altered sleeping 0 0 1  Tired, decreased energy 0 2 1  Change in appetite 0 0 0  Feeling bad or failure about yourself  0 1 0  Trouble concentrating 0 0 1  Moving slowly or fidgety/restless 0 1 0  Suicidal thoughts 0 0 0  PHQ-9 Score '2 7 6  ' Difficult doing work/chores Somewhat difficult Somewhat difficult -  Some recent data might be hidden      Social History   Tobacco Use  Smoking Status Former   Packs/day: 1.00   Years: 60.00   Pack years: 60.00   Types: Cigarettes  Smokeless Tobacco Never  Tobacco Comments   Hasn't smoked since he had his stroke    BP Readings from Last 3 Encounters:  12/25/21 (!) 144/80  11/11/21 138/62  11/03/21 (!) 148/72   Pulse Readings from Last 3 Encounters:  11/11/21 64  11/03/21 (!) 57  10/29/21 73   Wt Readings from Last 3 Encounters:  12/25/21 170 lb (77.1 kg)  11/03/21 165 lb 6.4 oz (75 kg)  10/29/21 172 lb (78 kg)    Assessment/Interventions: Review of patient past medical history, allergies, medications, health status, including review of consultants reports, laboratory and other test data, was performed as part of comprehensive evaluation and provision of chronic care management services.   SDOH:  (Social Determinants of Health) assessments and interventions performed: No   CCM Care Plan  Allergies  Allergen Reactions   Lisinopril-Hydrochlorothiazide Swelling    ZESTRIL   Doxycycline Rash    Medications Reviewed Today     Reviewed by Chriss Driver, LPN (Licensed Practical Nurse) on 12/25/21 at [place] List Status:  <None>   Medication Order Taking? Sig Documenting Provider Last Dose Status Informant  acetaminophen (TYLENOL) 325 MG tablet [id number] Yes Take 2 tablets (650 mg total) by mouth every 6 (six) hours as needed for mild pain (or Fever >/= 101). Cathlyn Parsons, PA-C Taking Active   amLODipine (NORVASC) 10 MG tablet [id number] Yes Take 1 tablet (10 mg total) by mouth daily. Susy Frizzle, MD Taking Active   artificial tears (LACRILUBE) OINT ophthalmic ointment [id number] Yes Place into both eyes every 4 (four) hours as needed for dry eyes. Cathlyn Parsons, PA-C Taking Active   B Complex-C (B-COMPLEX WITH VITAMIN C) tablet [id number]  Yes Take 1 tablet by mouth daily. Cathlyn Parsons, PA-C Taking Active   clonazePAM (KLONOPIN) 0.5 MG tablet [id number] Yes Take 0.5 tablets (0.25 mg total) by mouth 3 (three) times daily as needed for anxiety. Cloria Spring, MD Taking Active   clopidogrel (PLAVIX) 75 MG tablet [id number] Yes Take 1 tablet (75 mg total) by mouth daily. Susy Frizzle, MD Taking Active   dapagliflozin propanediol (FARXIGA) 5 MG TABS tablet [id number] Yes Take 1 tablet (5 mg total) by mouth daily before breakfast. Susy Frizzle, MD Taking Active   diclofenac Sodium (VOLTAREN) 1 % GEL [id number] Yes Apply 2 g topically 4 (four) times daily. Cathlyn Parsons, PA-C Taking Active   ezetimibe (ZETIA) 10 MG tablet [id number] Yes TAKE 1 TABLET BY MOUTH DAILY Susy Frizzle, MD Taking Active   FLUoxetine (PROZAC) 10 MG capsule [id number] Yes Take 1 capsule (10 mg total) by mouth daily. Cloria Spring, MD Taking Active   FLUoxetine (PROZAC) 20 MG capsule [id number] Yes TAKE (1) CAPSULE BY MOUTH EACH MORNING. Cloria Spring, MD Taking Active   fluticasone [facility]) 50 MCG/ACT nasal spray [id number] Yes Place 2 sprays into both nostrils daily. Susy Frizzle, MD Taking Active   hydrALAZINE (APRESOLINE) 100 MG tablet [id number] Yes Take 1 tablet (100 mg total) by mouth every 8 (eight)  hours. Susy Frizzle, MD Taking Active   magnesium gluconate (MAGONATE) 500 MG tablet [id number] Yes Take 0.5 tablets (250 mg total) by mouth at bedtime. Cathlyn Parsons, PA-C Taking Active            Med Note Jacobo Forest, Reynaldo Minium Nov 11, 2021  3:03 PM) 11/11/21 son has to pick up  metoprolol succinate (TOPROL-XL) 25 MG 24 hr tablet [id number] Yes Take 0.5 tablets (12.5 mg total) by mouth daily. Susy Frizzle, MD Taking Active   montelukast (SINGULAIR) 10 MG tablet [id number] Yes Take 1 tablet (10 mg total) by mouth at bedtime. Susy Frizzle, MD Taking Active   rOPINIRole (REQUIP) 0.25 MG tablet [id number] Yes Take 1 tablet (0.25 mg total) by mouth at bedtime. Susy Frizzle, MD Taking Active   rosuvastatin (CRESTOR) 20 MG tablet [id number] Yes Take 1 tablet (20 mg total) by mouth daily. Susy Frizzle, MD Taking Active   valsartan (DIOVAN) 160 MG tablet [id number] Yes Take 1 tablet (160 mg total) by mouth 2 (two) times daily. Susy Frizzle, MD Taking Active   vitamin B-12 (CYANOCOBALAMIN) 1000 MCG tablet [id number] Yes Take 1,000 mcg by mouth daily. [provider] Taking Active Multiple Informants            Patient Active Problem List   Diagnosis Date Noted   Right thalamic infarction ([place]) 07/31/2021   Stage 3a chronic kidney disease ([place])    Benign essential HTN    Left hemiparesis ([place])    Type 2 diabetes mellitus with hyperglycemia, without long-term current use of insulin ([place])    CVA (cerebral vascular accident) ([place]) 07/27/2021   Frequent PVCs 03/20/2020   Aortic stenosis 03/20/2020   COPD (chronic obstructive pulmonary disease) ([place]) 03/20/2020   Constipation 04/30/2019   Protein-calorie malnutrition ([place]) 04/30/2019   Anxiety with depression 12/05/2018   Seasonal and perennial allergic rhinitis 08/09/2018   Carotid artery disease ([place]) 11/18/2017   Hx of colonic polyps 08/26/2015   Erectile dysfunction 02/17/2015   AAA (abdominal aortic  aneurysm) without rupture ([place]) 05/21/2014   Chest pain 01/31/2014   CAD (coronary artery  disease) 12/24/2013   HEARING LOSS, CONDUCTIVE, LEFT 02/23/2010   DIZZINESS, CHRONIC 05/28/2008   PULMONARY NODULE 09/07/2007   Diabetes mellitus type II, controlled ([place]) 09/06/2007   Hyperlipidemia 09/06/2007   TOBACCO ABUSE 09/06/2007   Essential hypertension 09/06/2007   Allergic rhinitis 09/06/2007   GERD 09/06/2007   BASAL CELL CARCINOMA, NOSE 09/06/2007    Immunization History  Administered Date(s) Administered   Fluad Quad(high Dose 65+) 07/12/2019, 12/15/2020, 08/12/2021   H1N1 10/15/2008   Influenza Split 07/12/2011, 08/18/2012   Influenza Whole 09/25/2007, 08/23/2008, 08/25/2010   Influenza, High Dose Seasonal PF 08/01/2013, 08/16/2014, 09/05/2018   Influenza-Unspecified 08/05/2015, 08/28/2016, 09/02/2017   Moderna Sars-Covid-2 Vaccination 11/19/2019, 12/17/2019, 11/18/2020   Pneumococcal Conjugate-13 02/17/2015   Td 06/06/2012    Conditions to be addressed/monitored:  HTN, CAD, GERD, Type II DM, HLD,  Depression/Anxiety.    There are no care plans that you recently modified to display for this patient.      Medication Assistance: None required.  Patient affirms current coverage meets needs.  Patient's preferred pharmacy is:  [facility], [place] [address] Phone: [phone] Fax: [phone]   Uses pill box? Yes Pt endorses 100% compliance  We discussed: Benefits of medication synchronization, packaging and delivery as well as enhanced pharmacist oversight with [employer]. Patient decided to: Continue current medication management strategy  Care Plan and Follow Up Patient Decision:  Patient agrees to Care Plan and Follow-up.  Plan: The care management team will reach out to the patient again over the next 180 days.  Beverly Milch, PharmD, CPP Clinical Pharmacist Practitioner [facility] [phone]   Patient Care Plan: General Pharmacy (Adult)     Problem Identified: HTN, HLD, Depression/Anxiety, GERD, Type II DM   Priority: High  Onset Date: 01/01/2021     Long-Range Goal: Patient-Specific Goal   Start Date: 01/01/2021  Expected End Date: 07/04/2021  Recent Progress: On track  Priority: High  Note:   Current Barriers:  Unable to independently monitor therapeutic efficacy Unable to maintain control of blood pressure Possible subtherapeutic regimen for DM LDL above goal  Pharmacist Clinical Goal(s):  Over the next 180 days, patient will achieve adherence to monitoring guidelines and medication adherence to achieve therapeutic efficacy achieve control of blood pressure as evidenced by home monitoring maintain control of blood sugar as evidenced by A1c/glucose   adhere to prescribed medication regimen as evidenced by fill dates through collaboration with PharmD and provider.   Interventions: 1:1 collaboration with Buelah Manis, Modena Nunnery, MD regarding development and update of comprehensive plan of care as evidenced by provider attestation and co-signature Inter-disciplinary care team collaboration (see longitudinal plan of care) Comprehensive medication review performed; medication list updated in electronic medical record  Hypertension (BP goal <130/80) -Not ideally controlled -Current treatment: Metoprolol XL 12.75m Amlodipine 166mdaily Hydralazine 10045m8h -Medications previously tried: lisinopril/HCTZ (swelling), Nifedipine (pt d/c on his own)  -Current home readings: unavailable, pt not really checking -Denies hypotensive/hypertensive symptoms -Educated on BP goals and benefits of medications for prevention of heart attack, stroke and kidney damage; Importance of home blood pressure monitoring; Symptoms of hypotension and importance of maintaining adequate hydration; -Counseled to monitor BP at home at least a few times per week,  document, and provide log at future appointments -He does not monitor often due to getting worked up over it -He stopped nifedipine on his own and his last office blood pressure was  WNL.  He reports he was not taking it at this time. -Counseled him on need for home monitoring to adjust medications or monitor for increased/decreased BP/ -Recommended to continue current medication Recommended follow up with cardiology on nifedipine.  Update 10/01/21 BP @ home?? - they have not been checking BP at home, but they did just get a cuff. Counseled on appropriate blood pressure goals. Previously on ramipril and HCTZ so I have asked that they monitor closely for 2 weeks and we will check back to determine if anything else needs to be added back.  In the mean time watch salt in diet, contact me if they are noticing consistent readings > 140/90 before the two week period. No changes to medications at this time.   Hyperlipidemia/CAD: (LDL goal < 70) -Controlled -Current treatment: Simvastatin 32m Ezetimibe 13m-Medications previously tried: none noted -Educated on Cholesterol goals;  Benefits of statin for ASCVD risk reduction;  -Continues to be adherent' -Most recent lipid panel excellent, congratulations on this! -Recommended to continue current medication  Update 10/01/21 LDL in hospital was 84 - goal < 70. Patient is hight risk due to previous stroke. Currently on moderate intensity statin.  There is also the potential of increased myopathy and rhabdomyolysis with amlodipine and simvastatin doses over 2070m Based on that I would consider a switch to Crestor 29m87mily for better LDL lowering and no interaction with amlodipine.  Diabetes (A1c goal <7%) -Controlled -Current medications: Glimepiride 1mg 23malf tablet daily -Medications previously tried: metformin (headaches)  -Current home glucose readings fasting glucose: 100-115 most mornings post prandial glucose: none  available -Denies hypoglycemic/hyperglycemic symptoms -Current meal patterns: reports a good balanced diet -Exercise - active with yard work for himself and neighbors -Educated on A1c and blood sugar goals; Benefits of routine self-monitoring of blood sugar; -Counseled to check feet daily and get yearly eye exams -Denies hypoglycemia since decreased dose of glimepiride, continue to monitor -Recommended to continue current medication  Update 10/01/21 170-227 have been the range of sugars since coming home from rehab, no episodes of hypoglycemia. Monitoring sugars at home? 220s mid day today D/c Jardiance? - He does not remember why he stopped this, there was also a mention of a trial of Farxiga, however he reports that cost was the barrier in him taking this. Would he consider Jardiance/Farxiga  if price was not an issue? Yes the only barrier he can remember is cost. We spent a lot of time reviewing diet and portion control.  Counseled on limiting carbs to one source per meal.   For cardiovascular and renal benefits, I would consider a switch to Farxiga 5mg d18my and d/c glimepiride.  Work on carbohydrate servings and excess sugars.   Future plans to introduce low dose metformin if glucose still not at goal. Of course this is only if we can get him approved for patient assistance program. FU in 2 weeks on glucose readings and PAP application.  Depression/Anxiety (Goal: Control symptoms) -Controlled -Current treatment: Fluoxetine 29mg C51mzepam 0.125mg pr48medications previously tried/failed: none noted -Educated on Benefits of medication for symptom control  -Pt reports his mood has been great lately, no issues with sleep -Recommended to continue current medication  Update 10/01/21 Mentions vivid dreams at night, the son can hear him tossing and turning all night.  Mr. Hallstrom Kimmelt realize he has not slept well the next morning. Started ropinirole recently in hospital due to  restless legs.  This can often cause vivid dreams and  sleep disturbance especially at the beginning of treatment.  Have asked his son to trial him off of this for a few nights to see if this resolves the issue. Has FU with PCP tomorrow 12/16 - will discuss at that appt.  GERD (Goal: Minimize symptoms) -Controlled -Current treatment  Famotidine 59m prn -Medications previously tried: none noted -No longer taking famotidine scheduled, just takes prn -Reports his symptoms are very rare -Recommended to continue current medication   Patient Goals/Self-Care Activities Over the next 180 days, patient will:  - take medications as prescribed check glucose daily, document, and provide at future appointments check blood pressure at least a few times per week, document, and provide at future appointments  Follow Up Plan: The care management team will reach out to the patient again over the next 90 days.

## 2021-12-29 DIAGNOSIS — J449 Chronic obstructive pulmonary disease, unspecified: Secondary | ICD-10-CM | POA: Diagnosis not present

## 2021-12-29 DIAGNOSIS — I129 Hypertensive chronic kidney disease with stage 1 through stage 4 chronic kidney disease, or unspecified chronic kidney disease: Secondary | ICD-10-CM | POA: Diagnosis not present

## 2021-12-29 DIAGNOSIS — I69354 Hemiplegia and hemiparesis following cerebral infarction affecting left non-dominant side: Secondary | ICD-10-CM | POA: Diagnosis not present

## 2021-12-29 DIAGNOSIS — I2510 Atherosclerotic heart disease of native coronary artery without angina pectoris: Secondary | ICD-10-CM | POA: Diagnosis not present

## 2021-12-29 DIAGNOSIS — N183 Chronic kidney disease, stage 3 unspecified: Secondary | ICD-10-CM | POA: Diagnosis not present

## 2021-12-29 DIAGNOSIS — E1122 Type 2 diabetes mellitus with diabetic chronic kidney disease: Secondary | ICD-10-CM | POA: Diagnosis not present

## 2021-12-31 ENCOUNTER — Ambulatory Visit (INDEPENDENT_AMBULATORY_CARE_PROVIDER_SITE_OTHER): Payer: Medicare Other | Admitting: Pharmacist

## 2021-12-31 NOTE — Patient Instructions (Addendum)
Visit Information ? ? Goals Addressed   ? ?  ?  ?  ?  ? This Visit's Progress  ?  Track and Manage My Blood Pressure-Hypertension   On track  ?  Timeframe:  Long-Range Goal ?Priority:  High ?Start Date:   01/01/21                          ?Expected End Date:   12/31/21                 ? ?Follow Up Date 12/31/21 ?  ?- check blood pressure 3 times per week ?- write blood pressure results in a log or diary  ?  ?Why is this important?   ?You won't feel high blood pressure, but it can still hurt your blood vessels.  ?High blood pressure can cause heart or kidney problems. It can also cause a stroke.  ?Making lifestyle changes like losing a little weight or eating less salt will help.  ?Checking your blood pressure at home and at different times of the day can help to control blood pressure.  ?If the doctor prescribes medicine remember to take it the way the doctor ordered.  ?Call the office if you cannot afford the medicine or if there are questions about it.   ?  ?Notes:  ?  ? ?  ? ?Patient Care Plan: General Pharmacy (Adult)  ?  ? ?Problem Identified: HTN, HLD, Depression/Anxiety, GERD, Type II DM   ?Priority: High  ?Onset Date: 01/01/2021  ?  ? ?Long-Range Goal: Patient-Specific Goal   ?Start Date: 01/01/2021  ?Expected End Date: 07/04/2021  ?Recent Progress: On track  ?Priority: High  ?Note:   ?Current Barriers:  ?Unable to independently monitor therapeutic efficacy ?Unable to maintain control of blood pressure ?Possible subtherapeutic regimen for DM ?LDL above goal ? ?Pharmacist Clinical Goal(s):  ?Over the next 180 days, patient will achieve adherence to monitoring guidelines and medication adherence to achieve therapeutic efficacy ?achieve control of blood pressure as evidenced by home monitoring ?maintain control of blood sugar as evidenced by A1c/glucose   ?adhere to prescribed medication regimen as evidenced by fill dates through collaboration with PharmD and provider.  ? ?Interventions: ?1:1 collaboration with  Buelah Manis, Modena Nunnery, MD regarding development and update of comprehensive plan of care as evidenced by provider attestation and co-signature ?Inter-disciplinary care team collaboration (see longitudinal plan of care) ?Comprehensive medication review performed; medication list updated in electronic medical record ? ?Hypertension (BP goal <130/80) ?-Not ideally controlled ?-Current treatment: ?Metoprolol XL 12.'5mg'$  ?Amlodipine '10mg'$  daily ?Hydralazine '100mg'$  q8h ?-Medications previously tried: lisinopril/HCTZ (swelling), Nifedipine (pt d/c on his own)  ?-Current home readings: unavailable, pt not really checking ?-Denies hypotensive/hypertensive symptoms ?-Educated on BP goals and benefits of medications for prevention of heart attack, stroke and kidney damage; ?Importance of home blood pressure monitoring; ?Symptoms of hypotension and importance of maintaining adequate hydration; ?-Counseled to monitor BP at home at least a few times per week, document, and provide log at future appointments ?-He does not monitor often due to getting worked up over it ?-He stopped nifedipine on his own and his last office blood pressure was WNL.  He reports he was not taking it at this time. ?-Counseled him on need for home monitoring to adjust medications or monitor for increased/decreased BP/ ?-Recommended to continue current medication ?Recommended follow up with cardiology on nifedipine. ? ?Update 10/01/21 ?BP @ home?? - they have not been checking BP at  home, but they did just get a cuff. ?Counseled on appropriate blood pressure goals. ?Previously on ramipril and HCTZ so I have asked that they monitor closely for 2 weeks and we will check back to determine if anything else needs to be added back.  In the mean time watch salt in diet, contact me if they are noticing consistent readings > 140/90 before the two week period. ?No changes to medications at this time. ? ? ?Hyperlipidemia/CAD: (LDL goal < 70) ?-Controlled ?-Current  treatment: ?Simvastatin '40mg'$  ?Ezetimibe '10mg'$  ?-Medications previously tried: none noted ?-Educated on Cholesterol goals;  ?Benefits of statin for ASCVD risk reduction;  ?-Continues to be adherent' ?-Most recent lipid panel excellent, congratulations on this! ?-Recommended to continue current medication ? ?Update 10/01/21 ?LDL in hospital was 84 - goal < 70. ?Patient is hight risk due to previous stroke. ?Currently on moderate intensity statin.  There is also the potential of increased myopathy and rhabdomyolysis with amlodipine and simvastatin doses over '20mg'$ .  Based on that I would consider a switch to Crestor '20mg'$  daily for better LDL lowering and no interaction with amlodipine. ? ?Diabetes (A1c goal <7%) ?-Controlled ?-Current medications: ?Glimepiride '1mg'$  - half tablet daily ?-Medications previously tried: metformin (headaches)  ?-Current home glucose readings ?fasting glucose: 100-115 most mornings ?post prandial glucose: none available ?-Denies hypoglycemic/hyperglycemic symptoms ?-Current meal patterns: reports a good balanced diet ?-Exercise - active with yard work for himself and neighbors ?-Educated on A1c and blood sugar goals; ?Benefits of routine self-monitoring of blood sugar; ?-Counseled to check feet daily and get yearly eye exams ?-Denies hypoglycemia since decreased dose of glimepiride, continue to monitor ?-Recommended to continue current medication ? ?Update 10/01/21 ?170-227 have been the range of sugars since coming home from rehab, no episodes of hypoglycemia. ?Monitoring sugars at home? 220s mid day today ?D/c Jardiance? - He does not remember why he stopped this, there was also a mention of a trial of Farxiga, however he reports that cost was the barrier in him taking this. ?Would he consider Jardiance/Farxiga  if price was not an issue? Yes the only barrier he can remember is cost. ?We spent a lot of time reviewing diet and portion control.  Counseled on limiting carbs to one source per  meal.   ?For cardiovascular and renal benefits, I would consider a switch to [place] '5mg'$  daily and d/c glimepiride.  Work on carbohydrate servings and excess sugars.   ?Future plans to introduce low dose metformin if glucose still not at goal. ?Of course this is only if we can get him approved for patient assistance program. ?FU in 2 weeks on glucose readings and PAP application. ? ?Depression/Anxiety (Goal: Control symptoms) ?-Controlled ?-Current treatment: ?Fluoxetine '20mg'$  ?Clonazepam 0.'125mg'$  prn ?-Medications previously tried/failed: none noted ?-Educated on Benefits of medication for symptom control  ?-Pt reports his mood has been great lately, no issues with sleep ?-Recommended to continue current medication ? ?Update 10/01/21 ?Mentions vivid dreams at night, the son can hear him tossing and turning all night.  Mr. Littlefield does not realize he has not slept well the next morning. ?Started ropinirole recently in hospital due to restless legs.  This can often cause vivid dreams and sleep disturbance especially at the beginning of treatment.  Have asked his son to trial him off of this for a few nights to see if this resolves the issue. ?Has FU with PCP tomorrow 12/16 - will discuss at that appt. ? ?GERD (Goal: Minimize symptoms) ?-Controlled ?-Current treatment  ?Famotidine '20mg'$   prn ?-Medications previously tried: none noted ?-No longer taking famotidine scheduled, just takes prn ?-Reports his symptoms are very rare ?-Recommended to continue current medication ? ? ?Patient Goals/Self-Care Activities ?Over the next 180 days, patient will:  ?- take medications as prescribed ?check glucose daily, document, and provide at future appointments ?check blood pressure at least a few times per week, document, and provide at future appointments ? ?Follow Up Plan: The care management team will reach out to the patient again over the next 90 days.  ? ? ?  ? ?  ? ?  ? ?The patient verbalized understanding of instructions,  educational materials, and care plan provided today and declined offer to receive copy of patient instructions, educational materials, and care plan.  ?Telephone follow up appointment with pharmacy team member schedule

## 2022-01-04 ENCOUNTER — Other Ambulatory Visit: Payer: Self-pay

## 2022-01-04 ENCOUNTER — Ambulatory Visit (INDEPENDENT_AMBULATORY_CARE_PROVIDER_SITE_OTHER): Payer: Medicare Other | Admitting: Family Medicine

## 2022-01-04 VITALS — BP 138/64 | HR 69 | Temp 97.3°F | Ht 71.0 in | Wt 162.0 lb

## 2022-01-04 DIAGNOSIS — I2510 Atherosclerotic heart disease of native coronary artery without angina pectoris: Secondary | ICD-10-CM

## 2022-01-04 DIAGNOSIS — M25512 Pain in left shoulder: Secondary | ICD-10-CM

## 2022-01-04 DIAGNOSIS — R300 Dysuria: Secondary | ICD-10-CM | POA: Diagnosis not present

## 2022-01-04 DIAGNOSIS — E1165 Type 2 diabetes mellitus with hyperglycemia: Secondary | ICD-10-CM

## 2022-01-04 MED ORDER — GLIPIZIDE ER 5 MG PO TB24: 5.0000 mg | ORAL_TABLET | Freq: Every day | ORAL | 3 refills | Status: DC

## 2022-01-04 MED ORDER — SULFAMETHOXAZOLE-TRIMETHOPRIM 800-160 MG PO TABS: 1.0000 | ORAL_TABLET | Freq: Two times a day (BID) | ORAL | 0 refills | Status: DC

## 2022-01-04 MED ORDER — BUPROPION HCL ER (XL) 150 MG PO TB24: 150.0000 mg | ORAL_TABLET | Freq: Every day | ORAL | 3 refills | Status: DC

## 2022-01-04 NOTE — Progress Notes (Signed)
? ?Subjective:  ? ? Patient ID: Barry [name], male    DOB: 02-Oct-1933, 88 y.o.   MRN: [id number] ? ?HPI ?Patient has several issues.  For the last 3 days he reports increased urinary frequency and urgency.  He reports urge incontinence.  He denies any dysuria however he does report increased urine volume.  Urinalysis shows +2 glucose and protein but no blood, leukocyte esterase, or nitrates.  However the patient reports a weak stream and at times he is unable to start or initiate his urine stream.  The sudden change makes me concerned about prostatitis.  Patient is currently on Farxiga.  He also complains of pain in his left shoulder.  I gave him a cortisone injection in December that seem to help substantially.  He is requesting a repeat cortisone injection due to the pain with abduction. ? ?Past Medical History:  ?Diagnosis Date  ? AAA (abdominal aortic aneurysm) 12/17/2007  ? 4.7 cm  ? Allergy   ? rhinitis  ? Anxiety   ? Cancer [facility])   ? skin  ? Cataract   ? Depression   ? Diabetes mellitus   ? Diverticulosis   ? GERD (gastroesophageal reflux disease)   ? Hepatitis   ? Hx of colonic polyps 08/26/2015  ? Hyperlipidemia   ? Hypertension   ? Nephrolithiasis   ? Personal history of colonic polyps   ? adenomas, serrated also  ? PVD (peripheral vascular disease) ([place])   ? Stroke [facility])   ? Tobacco abuse   ? ?Past Surgical History:  ?Procedure Laterality Date  ? COLONOSCOPY  2006, 2008, 2010, 05/27/2011  ? numerous adenomas - 13 in 2006, 3, 2008, 2 2012 (up to 1 cm), 4 diminutive adenomas, serrated adenomas 2012. Diverticulosis and hemorrhoids also.  ? EYE SURGERY    ? EYE SURGERY Left   ? Kidney stone operation    ? lower limb amputation, other toe 5th    ? percutaneous stent graft repair of infrarenal AAA  11/10  ? 5.6 cm (T. early)  ? surgical excision basal cell carcinoma    ? TEE WITHOUT CARDIOVERSION N/A 07/29/2021  ? Procedure: TRANSESOPHAGEAL ECHOCARDIOGRAM (TEE);  Surgeon: Geralynn Rile, MD;   Location: [facility];  Service: Cardiovascular;  Laterality: N/A;  ? tympanic eardrum repair    ? VASECTOMY    ? ?Current Outpatient Medications on File Prior to Visit  ?Medication Sig Dispense Refill  ? acetaminophen (TYLENOL) 325 MG tablet Take 2 tablets (650 mg total) by mouth every 6 (six) hours as needed for mild pain (or Fever >/= 101).    ? amLODipine (NORVASC) 10 MG tablet Take 1 tablet (10 mg total) by mouth daily. 90 tablet 3  ? artificial tears (LACRILUBE) OINT ophthalmic ointment Place into both eyes every 4 (four) hours as needed for dry eyes.    ? B Complex-C (B-COMPLEX WITH VITAMIN C) tablet Take 1 tablet by mouth daily.    ? clonazePAM (KLONOPIN) 0.5 MG tablet Take 0.5 tablets (0.25 mg total) by mouth 3 (three) times daily as needed for anxiety. 60 tablet 1  ? clopidogrel (PLAVIX) 75 MG tablet Take 1 tablet (75 mg total) by mouth daily. 90 tablet 3  ? dapagliflozin propanediol (FARXIGA) 5 MG TABS tablet Take 1 tablet (5 mg total) by mouth daily before breakfast. 30 tablet 11  ? diclofenac Sodium (VOLTAREN) 1 % GEL Apply 2 g topically 4 (four) times daily.    ? ezetimibe (ZETIA) 10 MG tablet TAKE 1  TABLET BY MOUTH DAILY 90 tablet 3  ? FLUoxetine (PROZAC) 10 MG capsule Take 1 capsule (10 mg total) by mouth daily. 90 capsule 2  ? FLUoxetine (PROZAC) 20 MG capsule TAKE (1) CAPSULE BY MOUTH EACH MORNING. 90 capsule 2  ? fluticasone (FLONASE) 50 MCG/ACT nasal spray Place 2 sprays into both nostrils daily. 16 g 6  ? hydrALAZINE (APRESOLINE) 100 MG tablet Take 1 tablet (100 mg total) by mouth every 8 (eight) hours. 90 tablet 1  ? magnesium gluconate (MAGONATE) 500 MG tablet Take 0.5 tablets (250 mg total) by mouth at bedtime.    ? metoprolol succinate (TOPROL-XL) 25 MG 24 hr tablet Take 0.5 tablets (12.5 mg total) by mouth daily. 90 tablet 3  ? montelukast (SINGULAIR) 10 MG tablet Take 1 tablet (10 mg total) by mouth at bedtime. 90 tablet 1  ? rOPINIRole (REQUIP) 0.25 MG tablet Take 1 tablet (0.25 mg total) by  mouth at bedtime. 90 tablet 3  ? rosuvastatin (CRESTOR) 20 MG tablet Take 1 tablet (20 mg total) by mouth daily. 90 tablet 3  ? valsartan (DIOVAN) 160 MG tablet Take 1 tablet (160 mg total) by mouth 2 (two) times daily. 180 tablet 3  ? vitamin B-12 (CYANOCOBALAMIN) 1000 MCG tablet Take 1,000 mcg by mouth daily.    ? ?No current facility-administered medications on file prior to visit.  ? ?Allergies  ?Allergen Reactions  ? Lisinopril-Hydrochlorothiazide Swelling  ?  ZESTRIL  ? Doxycycline Rash  ? ?Social History  ? ?Socioeconomic History  ? Marital status: Widowed  ?  Spouse name: Not on file  ? Number of children: 2  ? Years of education: Not on file  ? Highest education level: Not on file  ?Occupational History  ?  Employer: RETIRED  ?Tobacco Use  ? Smoking status: Former  ?  Packs/day: 1.00  ?  Years: 60.00  ?  Pack years: 60.00  ?  Types: Cigarettes  ? Smokeless tobacco: Never  ? Tobacco comments:  ?  Hasn't smoked since he had his stroke   ?Vaping Use  ? Vaping Use: Never used  ?Substance and Sexual Activity  ? Alcohol use: No  ? Drug use: No  ? Sexual activity: Yes  ?  Partners: Female  ?  Birth control/protection: Surgical  ?  Comment: vasectomy  ?Other Topics Concern  ? Not on file  ?Social History Narrative  ? 11/11/21 living with son, [name]  ? [employer], 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 retired 22 years - keeping busy. Lives alone. 1- step-daughter, 1 son - '68; 3 grandchildren. does odd-jobs, yard work  ? ?Social Determinants of Health  ? ?Financial Resource Strain: Low Risk   ? Difficulty of Paying Living Expenses: Not hard at all  ?Food Insecurity: No Food Insecurity  ? Worried About Charity fundraiser in the Last Year: Never true  ? Ran Out of Food in the Last Year: Never true  ?Transportation Needs: No Transportation Needs  ? Lack of Transportation (Medical): No  ? Lack of Transportation (Non-Medical): No  ?Physical Activity: Sufficiently Active  ? Days of Exercise per Week: 3 days  ? Minutes of  Exercise per Session: 60 min  ?Stress: Stress Concern Present  ? Feeling of Stress : To some extent  ?Social Connections: Moderately Isolated  ? Frequency of Communication with Friends and Family: More than three times a week  ? Frequency of Social Gatherings with Friends and Family: More than three times a week  ? Attends Religious  Services: 1 to 4 times per year  ? Active Member of Clubs or Organizations: No  ? Attends Archivist Meetings: Never  ? Marital Status: Widowed  ?Intimate Partner Violence: Not At Risk  ? Fear of Current or Ex-Partner: No  ? Emotionally Abused: No  ? Physically Abused: No  ? Sexually Abused: No  ? ? ? ?Review of Systems  ?All other systems reviewed and are negative. ? ?   ?Objective:  ? Physical Exam ?Vitals reviewed.  ?Constitutional:   ?   General: He is not in acute distress. ?   Appearance: He is not toxic-appearing.  ?HENT:  ?   Left Ear: Tympanic membrane is injected and scarred.  ?Cardiovascular:  ?   Rate and Rhythm: Normal rate.  ?   Heart sounds: Murmur heard.  ?Pulmonary:  ?   Effort: Pulmonary effort is normal.  ?   Breath sounds: Normal breath sounds. No wheezing.  ?Musculoskeletal:  ?   Right lower leg: No edema.  ?   Left lower leg: No edema.  ?Neurological:  ?   General: No focal deficit present.  ?   Mental Status: He is alert and oriented to person, place, and time.  ?   Cranial Nerves: No cranial nerve deficit.  ?   Sensory: No sensory deficit.  ?   Coordination: Coordination normal.  ?   Gait: Gait abnormal.  ?   Deep Tendon Reflexes: Reflexes normal.  ? ? ? ? ? ?   ?Assessment & Plan:  ?Dysuria - Plan: Urinalysis, Routine w reflex microscopic, Urine Culture ? ?Type 2 diabetes mellitus with hyperglycemia, without long-term current use of insulin (HCC) - Plan: Hemoglobin P2Z, BASIC METABOLIC PANEL WITH GFR, CBC with Differential/Platelet ? ?Acute pain of left shoulder ?Patient also admits he has been having low-grade fevers to 99 degrees.  Given his symptoms,  despite the normal urinalysis, I am concerned he may have prostatitis.  Begin Bactrim double strength tablets twice daily for 10 days and then reassess sooner if getting worse.  Discontinue Farxiga and replace wit

## 2022-01-05 LAB — CBC WITH DIFFERENTIAL/PLATELET
Absolute Monocytes: 1354 cells/uL — ABNORMAL HIGH (ref 200–950)
Basophils Absolute: 28 cells/uL (ref 0–200)
Basophils Relative: 0.2 %
Eosinophils Absolute: 14 cells/uL — ABNORMAL LOW (ref 15–500)
Eosinophils Relative: 0.1 %
HCT: 39.7 % (ref 38.5–50.0)
Hemoglobin: 13 g/dL — ABNORMAL LOW (ref 13.2–17.1)
Lymphs Abs: 1410 cells/uL (ref 850–3900)
MCH: 31.6 pg (ref 27.0–33.0)
MCHC: 32.7 g/dL (ref 32.0–36.0)
MCV: 96.4 fL (ref 80.0–100.0)
MPV: 10.8 fL (ref 7.5–12.5)
Monocytes Relative: 9.6 %
Neutro Abs: 11294 cells/uL — ABNORMAL HIGH (ref 1500–7800)
Neutrophils Relative %: 80.1 %
Platelets: 237 10*3/uL (ref 140–400)
RBC: 4.12 10*6/uL — ABNORMAL LOW (ref 4.20–5.80)
RDW: 12.2 % (ref 11.0–15.0)
Total Lymphocyte: 10 %
WBC: 14.1 10*3/uL — ABNORMAL HIGH (ref 3.8–10.8)

## 2022-01-05 LAB — BASIC METABOLIC PANEL WITH GFR
BUN/Creatinine Ratio: 24 (calc) — ABNORMAL HIGH (ref 6–22)
BUN: 32 mg/dL — ABNORMAL HIGH (ref 7–25)
CO2: 28 mmol/L (ref 20–32)
Calcium: 9.2 mg/dL (ref 8.6–10.3)
Chloride: 101 mmol/L (ref 98–110)
Creat: 1.34 mg/dL — ABNORMAL HIGH (ref 0.70–1.22)
Glucose, Bld: 202 mg/dL — ABNORMAL HIGH (ref 65–99)
Potassium: 4.1 mmol/L (ref 3.5–5.3)
Sodium: 139 mmol/L (ref 135–146)
eGFR: 52 mL/min/{1.73_m2} — ABNORMAL LOW (ref >=60)

## 2022-01-05 LAB — HEMOGLOBIN A1C
Hgb A1c MFr Bld: 8.1 % of total Hgb — ABNORMAL HIGH (ref <5.7)
Mean Plasma Glucose: 186 mg/dL
eAG (mmol/L): 10.3 mmol/L

## 2022-01-07 ENCOUNTER — Other Ambulatory Visit: Payer: Self-pay | Admitting: Family Medicine

## 2022-01-07 DIAGNOSIS — I69354 Hemiplegia and hemiparesis following cerebral infarction affecting left non-dominant side: Secondary | ICD-10-CM | POA: Diagnosis not present

## 2022-01-07 DIAGNOSIS — I129 Hypertensive chronic kidney disease with stage 1 through stage 4 chronic kidney disease, or unspecified chronic kidney disease: Secondary | ICD-10-CM | POA: Diagnosis not present

## 2022-01-07 DIAGNOSIS — J449 Chronic obstructive pulmonary disease, unspecified: Secondary | ICD-10-CM | POA: Diagnosis not present

## 2022-01-07 DIAGNOSIS — I2510 Atherosclerotic heart disease of native coronary artery without angina pectoris: Secondary | ICD-10-CM | POA: Diagnosis not present

## 2022-01-07 DIAGNOSIS — N183 Chronic kidney disease, stage 3 unspecified: Secondary | ICD-10-CM | POA: Diagnosis not present

## 2022-01-07 DIAGNOSIS — E1122 Type 2 diabetes mellitus with diabetic chronic kidney disease: Secondary | ICD-10-CM | POA: Diagnosis not present

## 2022-01-07 LAB — URINALYSIS, ROUTINE W REFLEX MICROSCOPIC
Bilirubin Urine: NEGATIVE
Casts: NONE SEEN /LPF
Crystals: NONE SEEN /HPF
Hgb urine dipstick: NEGATIVE
Hyaline Cast: NONE SEEN /LPF
Ketones, ur: NEGATIVE
Leukocytes,Ua: NEGATIVE
Nitrite: NEGATIVE
Specific Gravity, Urine: 1.01 (ref 1.001–1.035)
Squamous Epithelial / HPF: NONE SEEN /HPF (ref <=5)
Yeast: NONE SEEN /HPF
pH: 5 (ref 5.0–8.0)

## 2022-01-07 LAB — MICROSCOPIC MESSAGE

## 2022-01-08 ENCOUNTER — Ambulatory Visit: Payer: Medicare Other | Admitting: *Deleted

## 2022-01-08 DIAGNOSIS — I10 Essential (primary) hypertension: Secondary | ICD-10-CM

## 2022-01-08 DIAGNOSIS — E1165 Type 2 diabetes mellitus with hyperglycemia: Secondary | ICD-10-CM

## 2022-01-08 LAB — URINE CULTURE
MICRO NUMBER:: 13152649
SPECIMEN QUALITY:: ADEQUATE

## 2022-01-08 NOTE — Patient Instructions (Signed)
Visit Information ? ?Thank you for taking time to visit with me today. Please don't hesitate to contact me if I can be of assistance to you before our next scheduled telephone appointment. ? ?Following are the goals we discussed today:  ?Take medications as prescribed   ?Attend all scheduled provider appointments ?Call provider office for new concerns or questions  ?check blood sugar at prescribed times: once daily ?enter blood sugar readings and medication or insulin into daily log ?take the blood sugar log to all doctor visits ?take the blood sugar meter to all doctor visits ?fill half of plate with vegetables ?manage portion size ?wash and dry feet carefully every day ?wear comfortable, cotton socks ?wear comfortable, well-fitting shoes ?check blood pressure daily ?write blood pressure results in a log or diary ?keep a blood pressure log ?take blood pressure log to all doctor appointments ?take medications for blood pressure exactly as prescribed ?report new symptoms to your doctor ?Follow low sodium diet, limit fast food, read labels ?Continue carbohydrate modified and low sodium diet ?Complete prescribed exercises (given by PT) daily ?Always ask for assistance as needed ?fall prevention strategies: change position slowly, use assistive device such as walker or cane (per provider recommendations) when walking, keep walkways clear, have good lighting in room. It is important to contact your provider if you have any falls, maintain muscle strength/tone by exercise per provider recommendations. ? ?Our next appointment is by telephone on 03/01/22 at 9 am ? ?Please call the care guide team at [phone] if you need to cancel or reschedule your appointment.  ? ?If you are experiencing a Mental Health or [place] or need someone to talk to, please call the Suicide and Crisis Lifeline: 988 ?call the [place] National Suicide Prevention Lifeline: 7540218249 or TTY: 714-565-7952 TTY 814-118-6821) to talk  to a trained counselor ?call 1-800-273-TALK (toll free, 24 hour hotline) ?go to [facility] [address], [place] [phone]) ?call the [facility]: [phone] ?call 911  ? ?Patient verbalizes understanding of instructions and care plan provided today and agrees to view in [place]. Active MyChart status confirmed with patient.   ? ?Jacqlyn Larsen RNC, BSN ?RN Case Manager ?[place] ?[phone] ? ?

## 2022-01-08 NOTE — Chronic Care Management (AMB) (Signed)
?Chronic Care Management  ? ?CCM RN Visit Note ? ?01/08/2022 ?Name: Barry [name] MRN: [id number] DOB: 1933-01-10 ? ?Subjective: ?Barry [name] is a 88 y.o. year old male who is a primary care patient of Pickard, Cammie Mcgee, MD. The care management team was consulted for assistance with disease management and care coordination needs.   ? ?Engaged with patient by telephone for follow up visit in response to provider referral for case management and/or care coordination services.  ? ?Consent to Services:  ?The patient was given information about Chronic Care Management services, agreed to services, and gave verbal consent prior to initiation of services.  Please see initial visit note for detailed documentation.  ? ?Patient agreed to services and verbal consent obtained.  ? ?Assessment: Review of patient past medical history, allergies, medications, health status, including review of consultants reports, laboratory and other test data, was performed as part of comprehensive evaluation and provision of chronic care management services.  ? ?SDOH (Social Determinants of Health) assessments and interventions performed:   ? ?CCM Care Plan ? ?Allergies  ?Allergen Reactions  ? Lisinopril-Hydrochlorothiazide Swelling  ?  ZESTRIL  ? Doxycycline Rash  ? ? ?Outpatient Encounter Medications as of 01/08/2022  ?Medication Sig Note  ? acetaminophen (TYLENOL) 325 MG tablet Take 2 tablets (650 mg total) by mouth every 6 (six) hours as needed for mild pain (or Fever >/= 101).   ? amLODipine (NORVASC) 10 MG tablet Take 1 tablet (10 mg total) by mouth daily.   ? artificial tears (LACRILUBE) OINT ophthalmic ointment Place into both eyes every 4 (four) hours as needed for dry eyes.   ? B Complex-C (B-COMPLEX WITH VITAMIN C) tablet Take 1 tablet by mouth daily.   ? buPROPion (WELLBUTRIN XL) 150 MG 24 hr tablet Take 1 tablet (150 mg total) by mouth daily.   ? clonazePAM (KLONOPIN) 0.5 MG tablet Take 0.5 tablets (0.25 mg total) by mouth 3  (three) times daily as needed for anxiety.   ? clopidogrel (PLAVIX) 75 MG tablet Take 1 tablet (75 mg total) by mouth daily.   ? diclofenac Sodium (VOLTAREN) 1 % GEL Apply 2 g topically 4 (four) times daily.   ? ezetimibe (ZETIA) 10 MG tablet TAKE 1 TABLET BY MOUTH DAILY   ? FLUoxetine (PROZAC) 10 MG capsule Take 1 capsule (10 mg total) by mouth daily.   ? FLUoxetine (PROZAC) 20 MG capsule TAKE (1) CAPSULE BY MOUTH EACH MORNING.   ? fluticasone (FLONASE) 50 MCG/ACT nasal spray Place 2 sprays into both nostrils daily.   ? glipiZIDE (GLIPIZIDE XL) 5 MG 24 hr tablet Take 1 tablet (5 mg total) by mouth daily with breakfast.   ? hydrALAZINE (APRESOLINE) 100 MG tablet TAKE 1 TABLET BY MOUTH EVERY 8 HOURS.   ? magnesium gluconate (MAGONATE) 500 MG tablet Take 0.5 tablets (250 mg total) by mouth at bedtime. 11/11/2021: 11/11/21 son has to pick up  ? metoprolol succinate (TOPROL-XL) 25 MG 24 hr tablet Take 0.5 tablets (12.5 mg total) by mouth daily.   ? montelukast (SINGULAIR) 10 MG tablet Take 1 tablet (10 mg total) by mouth at bedtime.   ? rOPINIRole (REQUIP) 0.25 MG tablet Take 1 tablet (0.25 mg total) by mouth at bedtime.   ? rosuvastatin (CRESTOR) 20 MG tablet Take 1 tablet (20 mg total) by mouth daily.   ? sulfamethoxazole-trimethoprim (BACTRIM DS) 800-160 MG tablet Take 1 tablet by mouth 2 (two) times daily.   ? valsartan (DIOVAN) 160 MG tablet Take 1  tablet (160 mg total) by mouth 2 (two) times daily.   ? vitamin B-12 (CYANOCOBALAMIN) 1000 MCG tablet Take 1,000 mcg by mouth daily.   ? ?No facility-administered encounter medications on file as of 01/08/2022.  ? ? ?Patient Active Problem List  ? Diagnosis Date Noted  ? Right thalamic infarction ([place]) 07/31/2021  ? Stage 3a chronic kidney disease ([place])   ? Benign essential HTN   ? Left hemiparesis ([place])   ? Type 2 diabetes mellitus with hyperglycemia, without long-term current use of insulin ([place])   ? CVA (cerebral vascular accident) ([place]) 07/27/2021  ? Frequent PVCs  03/20/2020  ? Aortic stenosis 03/20/2020  ? COPD (chronic obstructive pulmonary disease) ([place]) 03/20/2020  ? Constipation 04/30/2019  ? Protein-calorie malnutrition ([place]) 04/30/2019  ? Anxiety with depression 12/05/2018  ? Seasonal and perennial allergic rhinitis 08/09/2018  ? Carotid artery disease ([place]) 11/18/2017  ? Hx of colonic polyps 08/26/2015  ? Erectile dysfunction 02/17/2015  ? AAA (abdominal aortic aneurysm) without rupture ([place]) 05/21/2014  ? Chest pain 01/31/2014  ? CAD (coronary artery disease) 12/24/2013  ? HEARING LOSS, CONDUCTIVE, LEFT 02/23/2010  ? DIZZINESS, CHRONIC 05/28/2008  ? PULMONARY NODULE 09/07/2007  ? Diabetes mellitus type II, controlled ([place]) 09/06/2007  ? Hyperlipidemia 09/06/2007  ? TOBACCO ABUSE 09/06/2007  ? Essential hypertension 09/06/2007  ? Allergic rhinitis 09/06/2007  ? GERD 09/06/2007  ? BASAL CELL CARCINOMA, NOSE 09/06/2007  ? ? ?Conditions to be addressed/monitored:HTN and DMII ? ?Care Plan : RN Care Manager Plan of Care  ?Updates made by Kassie Mends, RN since 01/08/2022 12:00 AM  ?  ? ?Problem: No plan of care established for management of chronic disease states  (HTN, DM2, CVA, COPD)   ?Priority: High  ?  ? ?Long-Range Goal: Development of plan of care for chronic disease management  (HTN, DM2, CVA, COPD)   ?Start Date: 10/23/2021  ?Expected End Date: 04/21/2022  ?Priority: High  ?Note:   ?Current Barriers:  ?Knowledge Deficits related to plan of care for management of HTN and DMII  ?No Advanced Directives in place- patient's son states did receive advanced directives packet and plans to complete. ?Spoke with patient's son Barry [name] DPR who reports pt is temporarily living with him and his wife, pt eventually wants to go back to his home but has not been able to yet. Home health PT discharged, they were working with him after being inpatient rehab after CVA back in early October 2022.  Son reports pt has balance issues, left side deficits with mainly left leg affected  which has led to some falls, has DME in the home and stair lift for going up to the shower, has hired help at son's home from 8am-1230 pm which has helped tremendously, reports pt is walking some now for exercise, there are deficits with short term memory per son. Patient recently placed on antibiotic for dysuria and is still taking. Son reports blood pressure is checked frequently with "readings good", recent reading 130/60, reports CBG checked once daily with fasting ranges 160-170, random ranges 180-200 and is most likely elevated due to infection or what patient is eating, appetite is very good. ?Patient PHQ-9=6, pt is on medication and recently wellbutrin added. LCSW previously declined. ? ?RNCM Clinical Goal(s):  ?Patient will verbalize understanding of plan for management of HTN and DMII as evidenced by patient report, review of EHR and  through collaboration with RN Care manager, provider, and care team.  ? ?Interventions: ?1:1 collaboration with primary care  provider regarding development and update of comprehensive plan of care as evidenced by provider attestation and co-signature ?Inter-disciplinary care team collaboration (see longitudinal plan of care) ?Evaluation of current treatment plan related to  self management and patient's adherence to plan as established by provider ? ? ?Diabetes Interventions:  (Status:  New goal. and Goal on track:  Yes.) Long Term Goal ?Assessed patient's understanding of A1c goal: <7% ?Reviewed medications with patient and discussed importance of medication adherence ?Review of patient status, including review of consultants reports, relevant laboratory and other test results, and medications completed ?Reviewed carbohydrate modified diet ?Reviewed the role of infection related to elevated CBG ?Lab Results  ?Component Value Date  ? HGBA1C 6.7 (H) 07/27/2021  ? ?Hypertension Interventions:  (Status:  New goal. and Goal on track:  Yes.) Long Term Goal ?Last practice recorded  BP readings:  ?BP Readings from Last 3 Encounters:  ?10/05/21 (!) 163/91  ?10/02/21 (!) 142/78  ?08/26/21 (!) 147/67  ?Most recent eGFR/CrCl: No results found for: EGFR  No components found for: CRCL ? ?

## 2022-01-09 ENCOUNTER — Encounter: Payer: Self-pay | Admitting: Family Medicine

## 2022-01-11 ENCOUNTER — Telehealth: Payer: Self-pay

## 2022-01-11 NOTE — Telephone Encounter (Signed)
Call pt's daughter aware order was place. Per daughter hospice was there on last Friday do assessment. ?

## 2022-01-12 DIAGNOSIS — E1122 Type 2 diabetes mellitus with diabetic chronic kidney disease: Secondary | ICD-10-CM | POA: Diagnosis not present

## 2022-01-12 DIAGNOSIS — I69354 Hemiplegia and hemiparesis following cerebral infarction affecting left non-dominant side: Secondary | ICD-10-CM | POA: Diagnosis not present

## 2022-01-12 DIAGNOSIS — J449 Chronic obstructive pulmonary disease, unspecified: Secondary | ICD-10-CM | POA: Diagnosis not present

## 2022-01-12 DIAGNOSIS — E0840 Diabetes mellitus due to underlying condition with diabetic neuropathy, unspecified: Secondary | ICD-10-CM | POA: Diagnosis not present

## 2022-01-12 DIAGNOSIS — I69352 Hemiplegia and hemiparesis following cerebral infarction affecting left dominant side: Secondary | ICD-10-CM | POA: Diagnosis not present

## 2022-01-12 DIAGNOSIS — N183 Chronic kidney disease, stage 3 unspecified: Secondary | ICD-10-CM | POA: Diagnosis not present

## 2022-01-12 DIAGNOSIS — M79674 Pain in right toe(s): Secondary | ICD-10-CM | POA: Diagnosis not present

## 2022-01-12 DIAGNOSIS — I129 Hypertensive chronic kidney disease with stage 1 through stage 4 chronic kidney disease, or unspecified chronic kidney disease: Secondary | ICD-10-CM | POA: Diagnosis not present

## 2022-01-12 DIAGNOSIS — B351 Tinea unguium: Secondary | ICD-10-CM | POA: Diagnosis not present

## 2022-01-12 DIAGNOSIS — M2041 Other hammer toe(s) (acquired), right foot: Secondary | ICD-10-CM | POA: Diagnosis not present

## 2022-01-12 DIAGNOSIS — I2510 Atherosclerotic heart disease of native coronary artery without angina pectoris: Secondary | ICD-10-CM | POA: Diagnosis not present

## 2022-01-12 DIAGNOSIS — M21372 Foot drop, left foot: Secondary | ICD-10-CM | POA: Diagnosis not present

## 2022-01-15 DIAGNOSIS — E1165 Type 2 diabetes mellitus with hyperglycemia: Secondary | ICD-10-CM | POA: Diagnosis not present

## 2022-01-15 DIAGNOSIS — I10 Essential (primary) hypertension: Secondary | ICD-10-CM

## 2022-01-15 DIAGNOSIS — E7800 Pure hypercholesterolemia, unspecified: Secondary | ICD-10-CM | POA: Diagnosis not present

## 2022-01-22 DIAGNOSIS — I69354 Hemiplegia and hemiparesis following cerebral infarction affecting left non-dominant side: Secondary | ICD-10-CM | POA: Diagnosis not present

## 2022-01-22 DIAGNOSIS — E1122 Type 2 diabetes mellitus with diabetic chronic kidney disease: Secondary | ICD-10-CM | POA: Diagnosis not present

## 2022-01-22 DIAGNOSIS — J449 Chronic obstructive pulmonary disease, unspecified: Secondary | ICD-10-CM | POA: Diagnosis not present

## 2022-01-22 DIAGNOSIS — N183 Chronic kidney disease, stage 3 unspecified: Secondary | ICD-10-CM | POA: Diagnosis not present

## 2022-01-22 DIAGNOSIS — I129 Hypertensive chronic kidney disease with stage 1 through stage 4 chronic kidney disease, or unspecified chronic kidney disease: Secondary | ICD-10-CM | POA: Diagnosis not present

## 2022-01-22 DIAGNOSIS — I2510 Atherosclerotic heart disease of native coronary artery without angina pectoris: Secondary | ICD-10-CM | POA: Diagnosis not present

## 2022-01-25 DIAGNOSIS — K219 Gastro-esophageal reflux disease without esophagitis: Secondary | ICD-10-CM | POA: Diagnosis not present

## 2022-01-25 DIAGNOSIS — M1990 Unspecified osteoarthritis, unspecified site: Secondary | ICD-10-CM | POA: Diagnosis not present

## 2022-01-25 DIAGNOSIS — I69354 Hemiplegia and hemiparesis following cerebral infarction affecting left non-dominant side: Secondary | ICD-10-CM | POA: Diagnosis not present

## 2022-01-25 DIAGNOSIS — Z7984 Long term (current) use of oral hypoglycemic drugs: Secondary | ICD-10-CM | POA: Diagnosis not present

## 2022-01-25 DIAGNOSIS — I129 Hypertensive chronic kidney disease with stage 1 through stage 4 chronic kidney disease, or unspecified chronic kidney disease: Secondary | ICD-10-CM | POA: Diagnosis not present

## 2022-01-25 DIAGNOSIS — F419 Anxiety disorder, unspecified: Secondary | ICD-10-CM | POA: Diagnosis not present

## 2022-01-25 DIAGNOSIS — E1122 Type 2 diabetes mellitus with diabetic chronic kidney disease: Secondary | ICD-10-CM | POA: Diagnosis not present

## 2022-01-25 DIAGNOSIS — E46 Unspecified protein-calorie malnutrition: Secondary | ICD-10-CM | POA: Diagnosis not present

## 2022-01-25 DIAGNOSIS — Z9852 Vasectomy status: Secondary | ICD-10-CM | POA: Diagnosis not present

## 2022-01-25 DIAGNOSIS — F32A Depression, unspecified: Secondary | ICD-10-CM | POA: Diagnosis not present

## 2022-01-25 DIAGNOSIS — J449 Chronic obstructive pulmonary disease, unspecified: Secondary | ICD-10-CM | POA: Diagnosis not present

## 2022-01-25 DIAGNOSIS — I2510 Atherosclerotic heart disease of native coronary artery without angina pectoris: Secondary | ICD-10-CM | POA: Diagnosis not present

## 2022-01-25 DIAGNOSIS — G2581 Restless legs syndrome: Secondary | ICD-10-CM | POA: Diagnosis not present

## 2022-01-25 DIAGNOSIS — F17210 Nicotine dependence, cigarettes, uncomplicated: Secondary | ICD-10-CM | POA: Diagnosis not present

## 2022-01-25 DIAGNOSIS — E1151 Type 2 diabetes mellitus with diabetic peripheral angiopathy without gangrene: Secondary | ICD-10-CM | POA: Diagnosis not present

## 2022-01-25 DIAGNOSIS — N183 Chronic kidney disease, stage 3 unspecified: Secondary | ICD-10-CM | POA: Diagnosis not present

## 2022-01-25 DIAGNOSIS — E785 Hyperlipidemia, unspecified: Secondary | ICD-10-CM | POA: Diagnosis not present

## 2022-01-25 DIAGNOSIS — Z9181 History of falling: Secondary | ICD-10-CM | POA: Diagnosis not present

## 2022-01-27 DIAGNOSIS — E1122 Type 2 diabetes mellitus with diabetic chronic kidney disease: Secondary | ICD-10-CM | POA: Diagnosis not present

## 2022-01-27 DIAGNOSIS — N183 Chronic kidney disease, stage 3 unspecified: Secondary | ICD-10-CM | POA: Diagnosis not present

## 2022-01-27 DIAGNOSIS — I2510 Atherosclerotic heart disease of native coronary artery without angina pectoris: Secondary | ICD-10-CM | POA: Diagnosis not present

## 2022-01-27 DIAGNOSIS — I69354 Hemiplegia and hemiparesis following cerebral infarction affecting left non-dominant side: Secondary | ICD-10-CM | POA: Diagnosis not present

## 2022-01-27 DIAGNOSIS — I129 Hypertensive chronic kidney disease with stage 1 through stage 4 chronic kidney disease, or unspecified chronic kidney disease: Secondary | ICD-10-CM | POA: Diagnosis not present

## 2022-01-27 DIAGNOSIS — J449 Chronic obstructive pulmonary disease, unspecified: Secondary | ICD-10-CM | POA: Diagnosis not present

## 2022-01-28 ENCOUNTER — Telehealth: Payer: Self-pay

## 2022-01-28 NOTE — Telephone Encounter (Signed)
Barry [name], pt's PT with [facility] called requested to extend pt's PT to 1X's/wk for 8wks. ? ? ?

## 2022-01-30 ENCOUNTER — Other Ambulatory Visit: Payer: Self-pay | Admitting: Family Medicine

## 2022-02-01 DIAGNOSIS — J449 Chronic obstructive pulmonary disease, unspecified: Secondary | ICD-10-CM | POA: Diagnosis not present

## 2022-02-01 DIAGNOSIS — I129 Hypertensive chronic kidney disease with stage 1 through stage 4 chronic kidney disease, or unspecified chronic kidney disease: Secondary | ICD-10-CM | POA: Diagnosis not present

## 2022-02-01 DIAGNOSIS — I69354 Hemiplegia and hemiparesis following cerebral infarction affecting left non-dominant side: Secondary | ICD-10-CM | POA: Diagnosis not present

## 2022-02-01 DIAGNOSIS — N183 Chronic kidney disease, stage 3 unspecified: Secondary | ICD-10-CM | POA: Diagnosis not present

## 2022-02-01 DIAGNOSIS — I2510 Atherosclerotic heart disease of native coronary artery without angina pectoris: Secondary | ICD-10-CM | POA: Diagnosis not present

## 2022-02-01 DIAGNOSIS — E1122 Type 2 diabetes mellitus with diabetic chronic kidney disease: Secondary | ICD-10-CM | POA: Diagnosis not present

## 2022-02-01 NOTE — Telephone Encounter (Signed)
LOV 01/04/22 ?Last refill 12/15/21, #60, 1 refills ? ?Please review, thanks! ? ?

## 2022-02-08 DIAGNOSIS — N183 Chronic kidney disease, stage 3 unspecified: Secondary | ICD-10-CM | POA: Diagnosis not present

## 2022-02-08 DIAGNOSIS — I129 Hypertensive chronic kidney disease with stage 1 through stage 4 chronic kidney disease, or unspecified chronic kidney disease: Secondary | ICD-10-CM | POA: Diagnosis not present

## 2022-02-08 DIAGNOSIS — I69354 Hemiplegia and hemiparesis following cerebral infarction affecting left non-dominant side: Secondary | ICD-10-CM | POA: Diagnosis not present

## 2022-02-08 DIAGNOSIS — E1122 Type 2 diabetes mellitus with diabetic chronic kidney disease: Secondary | ICD-10-CM | POA: Diagnosis not present

## 2022-02-08 DIAGNOSIS — J449 Chronic obstructive pulmonary disease, unspecified: Secondary | ICD-10-CM | POA: Diagnosis not present

## 2022-02-08 DIAGNOSIS — I2510 Atherosclerotic heart disease of native coronary artery without angina pectoris: Secondary | ICD-10-CM | POA: Diagnosis not present

## 2022-02-12 DIAGNOSIS — H04123 Dry eye syndrome of bilateral lacrimal glands: Secondary | ICD-10-CM | POA: Diagnosis not present

## 2022-02-17 DIAGNOSIS — E1122 Type 2 diabetes mellitus with diabetic chronic kidney disease: Secondary | ICD-10-CM | POA: Diagnosis not present

## 2022-02-17 DIAGNOSIS — C44319 Basal cell carcinoma of skin of other parts of face: Secondary | ICD-10-CM | POA: Diagnosis not present

## 2022-02-17 DIAGNOSIS — I129 Hypertensive chronic kidney disease with stage 1 through stage 4 chronic kidney disease, or unspecified chronic kidney disease: Secondary | ICD-10-CM | POA: Diagnosis not present

## 2022-02-17 DIAGNOSIS — I69354 Hemiplegia and hemiparesis following cerebral infarction affecting left non-dominant side: Secondary | ICD-10-CM | POA: Diagnosis not present

## 2022-02-17 DIAGNOSIS — J449 Chronic obstructive pulmonary disease, unspecified: Secondary | ICD-10-CM | POA: Diagnosis not present

## 2022-02-17 DIAGNOSIS — N183 Chronic kidney disease, stage 3 unspecified: Secondary | ICD-10-CM | POA: Diagnosis not present

## 2022-02-17 DIAGNOSIS — I2510 Atherosclerotic heart disease of native coronary artery without angina pectoris: Secondary | ICD-10-CM | POA: Diagnosis not present

## 2022-02-23 ENCOUNTER — Ambulatory Visit (INDEPENDENT_AMBULATORY_CARE_PROVIDER_SITE_OTHER): Payer: Medicare Other | Admitting: Family Medicine

## 2022-02-23 VITALS — BP 140/68 | HR 69 | Temp 98.0°F | Ht 71.0 in

## 2022-02-23 DIAGNOSIS — I2510 Atherosclerotic heart disease of native coronary artery without angina pectoris: Secondary | ICD-10-CM | POA: Diagnosis not present

## 2022-02-23 DIAGNOSIS — E1165 Type 2 diabetes mellitus with hyperglycemia: Secondary | ICD-10-CM

## 2022-02-23 MED ORDER — SITAGLIPTIN PHOSPHATE 100 MG PO TABS: 100.0000 mg | ORAL_TABLET | Freq: Every day | ORAL | 3 refills | Status: DC

## 2022-02-23 NOTE — Progress Notes (Signed)
? ?Subjective:  ? ? Patient ID: Barry [name], male    DOB: [birth year], 88 y.o.   MRN: [id number] ? ?HPI ?01/04/22 ?Patient has several issues.  For the last 3 days he reports increased urinary frequency and urgency.  He reports urge incontinence.  He denies any dysuria however he does report increased urine volume.  Urinalysis shows +2 glucose and protein but no blood, leukocyte esterase, or nitrates.  However the patient reports a weak stream and at times he is unable to start or initiate his urine stream.  The sudden change makes me concerned about prostatitis.  Patient is currently on Farxiga.  He also complains of pain in his left shoulder.  I gave him a cortisone injection in December that seem to help substantially.  He is requesting a repeat cortisone injection due to the pain with abduction.  At that time, my plan was: ? ?Patient also admits he has been having low-grade fevers to 99 degrees.  Given his symptoms, despite the normal urinalysis, I am concerned he may have prostatitis.  Begin Bactrim double strength tablets twice daily for 10 days and then reassess sooner if getting worse.  Discontinue Farxiga and replace with glipizide extended release 5 mg daily.  Check BMP, CBC, and A1c.  Using sterile technique, I injected his left shoulder with 2 cc lidocaine, 2 cc of Marcaine, and 2 cc of 40 mg/mL Kenalog. ? ?02/23/22 ?Urine culture grew klebsiella sensitive to bactrim.  Patient presents today with his son.  His son reports that his blood sugars have been higher recently.  His fasting sugars in the morning are in the 170-180 range.  He is having 2-hour postprandial sugars near 260.  A lot of his sugars are above 200.  This is despite taking the glipizide.  He denies any hypoglycemic episodes.  He also reports pitting edema in both legs.  He has 1+ pitting edema from his mid shins to his toes bilaterally.  I believe that this is mainly dependent edema made worse by the amlodipine.  He denies any chest  pain or shortness of breath and his lungs are clear to auscultation bilaterally.  He does have some pain in his right great toe.  He has an ingrown toenail over the medial nail fold however this is mild and he declines any treatment for that today.  He also has some mild pain in his left knee over the medial and lateral joint line however this is gradually getting better ? ?Past Medical History:  ?Diagnosis Date  ? AAA (abdominal aortic aneurysm) 12/17/2007  ? 4.7 cm  ? Allergy   ? rhinitis  ? Anxiety   ? Cancer [facility])   ? skin  ? Cataract   ? Depression   ? Diabetes mellitus   ? Diverticulosis   ? GERD (gastroesophageal reflux disease)   ? Hepatitis   ? Hx of colonic polyps 08/26/2015  ? Hyperlipidemia   ? Hypertension   ? Nephrolithiasis   ? Personal history of colonic polyps   ? adenomas, serrated also  ? PVD (peripheral vascular disease) ([place])   ? Stroke [facility])   ? Tobacco abuse   ? ?Past Surgical History:  ?Procedure Laterality Date  ? COLONOSCOPY  2006, 2008, 2010, 05/27/2011  ? numerous adenomas - 13 in 2006, 3, 2008, 2 2012 (up to 1 cm), 4 diminutive adenomas, serrated adenomas 2012. Diverticulosis and hemorrhoids also.  ? EYE SURGERY    ? EYE SURGERY Left   ? Kidney  stone operation    ? lower limb amputation, other toe 5th    ? percutaneous stent graft repair of infrarenal AAA  11/10  ? 5.6 cm (T. early)  ? surgical excision basal cell carcinoma    ? TEE WITHOUT CARDIOVERSION N/A 07/29/2021  ? Procedure: TRANSESOPHAGEAL ECHOCARDIOGRAM (TEE);  Surgeon: Geralynn Rile, MD;  Location: [facility];  Service: Cardiovascular;  Laterality: N/A;  ? tympanic eardrum repair    ? VASECTOMY    ? ?Current Outpatient Medications on File Prior to Visit  ?Medication Sig Dispense Refill  ? acetaminophen (TYLENOL) 325 MG tablet Take 2 tablets (650 mg total) by mouth every 6 (six) hours as needed for mild pain (or Fever >/= 101).    ? amLODipine (NORVASC) 10 MG tablet Take 1 tablet (10 mg total) by mouth daily. 90 tablet 3  ?  artificial tears (LACRILUBE) OINT ophthalmic ointment Place into both eyes every 4 (four) hours as needed for dry eyes.    ? B Complex-C (B-COMPLEX WITH VITAMIN C) tablet Take 1 tablet by mouth daily.    ? buPROPion (WELLBUTRIN XL) 150 MG 24 hr tablet Take 1 tablet (150 mg total) by mouth daily. 30 tablet 3  ? clonazePAM (KLONOPIN) 0.5 MG tablet TAKE 1/2 TABLET BY MOUTH 3 TIMES DAILY AS NEEDED FOR ANXIETY. 60 tablet 1  ? clopidogrel (PLAVIX) 75 MG tablet Take 1 tablet (75 mg total) by mouth daily. 90 tablet 3  ? diclofenac Sodium (VOLTAREN) 1 % GEL Apply 2 g topically 4 (four) times daily.    ? ezetimibe (ZETIA) 10 MG tablet TAKE 1 TABLET BY MOUTH DAILY 90 tablet 3  ? FLUoxetine (PROZAC) 10 MG capsule Take 1 capsule (10 mg total) by mouth daily. 90 capsule 2  ? FLUoxetine (PROZAC) 20 MG capsule TAKE (1) CAPSULE BY MOUTH EACH MORNING. 90 capsule 2  ? fluticasone (FLONASE) 50 MCG/ACT nasal spray Place 2 sprays into both nostrils daily. 16 g 6  ? glipiZIDE (GLIPIZIDE XL) 5 MG 24 hr tablet Take 1 tablet (5 mg total) by mouth daily with breakfast. 90 tablet 3  ? hydrALAZINE (APRESOLINE) 100 MG tablet TAKE 1 TABLET BY MOUTH EVERY 8 HOURS. 90 tablet 1  ? magnesium gluconate (MAGONATE) 500 MG tablet Take 0.5 tablets (250 mg total) by mouth at bedtime.    ? metoprolol succinate (TOPROL-XL) 25 MG 24 hr tablet Take 0.5 tablets (12.5 mg total) by mouth daily. 90 tablet 3  ? montelukast (SINGULAIR) 10 MG tablet Take 1 tablet (10 mg total) by mouth at bedtime. 90 tablet 1  ? rOPINIRole (REQUIP) 0.25 MG tablet Take 1 tablet (0.25 mg total) by mouth at bedtime. 90 tablet 3  ? rosuvastatin (CRESTOR) 20 MG tablet Take 1 tablet (20 mg total) by mouth daily. 90 tablet 3  ? sulfamethoxazole-trimethoprim (BACTRIM DS) 800-160 MG tablet Take 1 tablet by mouth 2 (two) times daily. 20 tablet 0  ? valsartan (DIOVAN) 160 MG tablet Take 1 tablet (160 mg total) by mouth 2 (two) times daily. 180 tablet 3  ? vitamin B-12 (CYANOCOBALAMIN) 1000 MCG  tablet Take 1,000 mcg by mouth daily.    ? ?No current facility-administered medications on file prior to visit.  ? ?Allergies  ?Allergen Reactions  ? Lisinopril-Hydrochlorothiazide Swelling  ?  ZESTRIL  ? Doxycycline Rash  ? ?Social History  ? ?Socioeconomic History  ? Marital status: Widowed  ?  Spouse name: Not on file  ? Number of children: 2  ? Years of education:  Not on file  ? Highest education level: Not on file  ?Occupational History  ?  Employer: RETIRED  ?Tobacco Use  ? Smoking status: Former  ?  Packs/day: 1.00  ?  Years: 60.00  ?  Pack years: 60.00  ?  Types: Cigarettes  ? Smokeless tobacco: Never  ? Tobacco comments:  ?  Hasn't smoked since he had his stroke   ?Vaping Use  ? Vaping Use: Never used  ?Substance and Sexual Activity  ? Alcohol use: No  ? Drug use: No  ? Sexual activity: Yes  ?  Partners: Female  ?  Birth control/protection: Surgical  ?  Comment: vasectomy  ?Other Topics Concern  ? Not on file  ?Social History Narrative  ? 11/11/21 living with son, [name]  ? [employer], 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 retired 19 years - keeping busy. Lives alone. 1- step-daughter, 1 son - '68; 3 grandchildren. does odd-jobs, yard work  ? ?Social Determinants of Health  ? ?Financial Resource Strain: Low Risk   ? Difficulty of Paying Living Expenses: Not hard at all  ?Food Insecurity: No Food Insecurity  ? Worried About Charity fundraiser in the Last Year: Never true  ? Ran Out of Food in the Last Year: Never true  ?Transportation Needs: No Transportation Needs  ? Lack of Transportation (Medical): No  ? Lack of Transportation (Non-Medical): No  ?Physical Activity: Sufficiently Active  ? Days of Exercise per Week: 3 days  ? Minutes of Exercise per Session: 60 min  ?Stress: Stress Concern Present  ? Feeling of Stress : To some extent  ?Social Connections: Moderately Isolated  ? Frequency of Communication with Friends and Family: More than three times a week  ? Frequency of Social Gatherings with Friends and  Family: More than three times a week  ? Attends Religious Services: 1 to 4 times per year  ? Active Member of Clubs or Organizations: No  ? Attends Archivist Meetings: Never  ? Marital Status: Wid

## 2022-02-24 DIAGNOSIS — Z9852 Vasectomy status: Secondary | ICD-10-CM | POA: Diagnosis not present

## 2022-02-24 DIAGNOSIS — I2510 Atherosclerotic heart disease of native coronary artery without angina pectoris: Secondary | ICD-10-CM | POA: Diagnosis not present

## 2022-02-24 DIAGNOSIS — E46 Unspecified protein-calorie malnutrition: Secondary | ICD-10-CM | POA: Diagnosis not present

## 2022-02-24 DIAGNOSIS — Z7984 Long term (current) use of oral hypoglycemic drugs: Secondary | ICD-10-CM | POA: Diagnosis not present

## 2022-02-24 DIAGNOSIS — N183 Chronic kidney disease, stage 3 unspecified: Secondary | ICD-10-CM | POA: Diagnosis not present

## 2022-02-24 DIAGNOSIS — M1990 Unspecified osteoarthritis, unspecified site: Secondary | ICD-10-CM | POA: Diagnosis not present

## 2022-02-24 DIAGNOSIS — I69354 Hemiplegia and hemiparesis following cerebral infarction affecting left non-dominant side: Secondary | ICD-10-CM | POA: Diagnosis not present

## 2022-02-24 DIAGNOSIS — J449 Chronic obstructive pulmonary disease, unspecified: Secondary | ICD-10-CM | POA: Diagnosis not present

## 2022-02-24 DIAGNOSIS — F17210 Nicotine dependence, cigarettes, uncomplicated: Secondary | ICD-10-CM | POA: Diagnosis not present

## 2022-02-24 DIAGNOSIS — E1151 Type 2 diabetes mellitus with diabetic peripheral angiopathy without gangrene: Secondary | ICD-10-CM | POA: Diagnosis not present

## 2022-02-24 DIAGNOSIS — E785 Hyperlipidemia, unspecified: Secondary | ICD-10-CM | POA: Diagnosis not present

## 2022-02-24 DIAGNOSIS — I129 Hypertensive chronic kidney disease with stage 1 through stage 4 chronic kidney disease, or unspecified chronic kidney disease: Secondary | ICD-10-CM | POA: Diagnosis not present

## 2022-02-24 DIAGNOSIS — F32A Depression, unspecified: Secondary | ICD-10-CM | POA: Diagnosis not present

## 2022-02-24 DIAGNOSIS — G2581 Restless legs syndrome: Secondary | ICD-10-CM | POA: Diagnosis not present

## 2022-02-24 DIAGNOSIS — E1122 Type 2 diabetes mellitus with diabetic chronic kidney disease: Secondary | ICD-10-CM | POA: Diagnosis not present

## 2022-02-24 DIAGNOSIS — F419 Anxiety disorder, unspecified: Secondary | ICD-10-CM | POA: Diagnosis not present

## 2022-02-24 DIAGNOSIS — Z9181 History of falling: Secondary | ICD-10-CM | POA: Diagnosis not present

## 2022-02-24 DIAGNOSIS — K219 Gastro-esophageal reflux disease without esophagitis: Secondary | ICD-10-CM | POA: Diagnosis not present

## 2022-02-25 ENCOUNTER — Telehealth: Payer: Self-pay

## 2022-02-25 DIAGNOSIS — J449 Chronic obstructive pulmonary disease, unspecified: Secondary | ICD-10-CM | POA: Diagnosis not present

## 2022-02-25 DIAGNOSIS — I129 Hypertensive chronic kidney disease with stage 1 through stage 4 chronic kidney disease, or unspecified chronic kidney disease: Secondary | ICD-10-CM | POA: Diagnosis not present

## 2022-02-25 DIAGNOSIS — I69354 Hemiplegia and hemiparesis following cerebral infarction affecting left non-dominant side: Secondary | ICD-10-CM | POA: Diagnosis not present

## 2022-02-25 DIAGNOSIS — N183 Chronic kidney disease, stage 3 unspecified: Secondary | ICD-10-CM | POA: Diagnosis not present

## 2022-02-25 DIAGNOSIS — I2510 Atherosclerotic heart disease of native coronary artery without angina pectoris: Secondary | ICD-10-CM | POA: Diagnosis not present

## 2022-02-25 DIAGNOSIS — E1122 Type 2 diabetes mellitus with diabetic chronic kidney disease: Secondary | ICD-10-CM | POA: Diagnosis not present

## 2022-02-25 NOTE — Telephone Encounter (Signed)
Call pt's son back, relay Dr. Antony Contras message to pt. Pt voiced understanding and nothing at this time. A ? ?lso advice pt should he he feels worrisome about his dad's BS, he can take him to Urgent care or ED if needed.  ?

## 2022-02-25 NOTE — Telephone Encounter (Signed)
Pt's son stated that the new med is making pt's BS going all over the place.  ? ?Check BS on the new meds on 5/10 ? ?5pm 74, feed pt recheck it was at 100 ? ?8-8:30pm BS up to 366 ? ?BS 5/11 ?330am 145 ?630am 135 ?1115am 317 ?1220 300 ? ?Pls advice ?

## 2022-03-01 ENCOUNTER — Ambulatory Visit (INDEPENDENT_AMBULATORY_CARE_PROVIDER_SITE_OTHER): Payer: Medicare Other | Admitting: *Deleted

## 2022-03-01 DIAGNOSIS — I10 Essential (primary) hypertension: Secondary | ICD-10-CM

## 2022-03-01 DIAGNOSIS — E1165 Type 2 diabetes mellitus with hyperglycemia: Secondary | ICD-10-CM

## 2022-03-01 NOTE — Patient Instructions (Signed)
Visit Information ? ?Thank you for taking time to visit with me today. Please don't hesitate to contact me if I can be of assistance to you before our next scheduled telephone appointment. ? ?Following are the goals we discussed today:  ?Take medications as prescribed   ?Attend all scheduled provider appointments ?Call provider office for new concerns or questions  ?check blood sugar at prescribed times: twice daily ?check feet daily for cuts, sores or redness ?enter blood sugar readings and medication or insulin into daily log ?take the blood sugar log to all doctor visits ?take the blood sugar meter to all doctor visits ?fill half of plate with vegetables ?limit fast food meals to no more than 1 per week ?manage portion size ?read food labels for fat, fiber, carbohydrates and portion size ?wash and dry feet carefully every day ?wear comfortable, cotton socks ?wear comfortable, well-fitting shoes ?check blood pressure daily ?write blood pressure results in a log or diary ?keep a blood pressure log ?take blood pressure log to all doctor appointments ?keep all doctor appointments ?take medications for blood pressure exactly as prescribed ?report new symptoms to your doctor ?Limit fast food, read labels ?Practice portion control ?Continue following carbohydrate modified and low sodium diet ?Continue to complete prescribed exercises (given by PT) daily ?Always ask for assistance as needed ?fall prevention strategies: change position slowly, use assistive device such as walker or cane (per provider recommendations) when walking, keep walkways clear, have good lighting in room. It is important to contact your provider if you have any falls, maintain muscle strength/tone by exercise per provider recommendations. ? ?Our next appointment is by telephone on 05/24/22 at 9 am ? ?Please call the care guide team at [phone] if you need to cancel or reschedule your appointment.  ? ?If you are experiencing a Mental Health or  [place] or need someone to talk to, please call the Suicide and Crisis Lifeline: 988 ?call the [place] National Suicide Prevention Lifeline: 2760162360 or TTY: 416-355-5250 TTY 934-545-5005) to talk to a trained counselor ?call 1-800-273-TALK (toll free, 24 hour hotline) ?go to [facility] [address], [place] [phone]) ?call the [facility]: [phone] ?call 911  ? ?Patient verbalizes understanding of instructions and care plan provided today and agrees to view in [place]. Active MyChart status confirmed with patient.   ? ?Jacqlyn Larsen RNC, BSN ?RN Case Manager ?[place] ?[phone] ? ?

## 2022-03-01 NOTE — Chronic Care Management (AMB) (Signed)
?Chronic Care Management  ? ?CCM RN Visit Note ? ?03/01/2022 ?Name: Barry [name] MRN: [id number] DOB: [birth year] ? ?Subjective: ?Barry [name] is a 88 y.o. year old male who is a primary care patient of Pickard, Cammie Mcgee, MD. The care management team was consulted for assistance with disease management and care coordination needs.   ? ?Engaged with patient by telephone for follow up visit in response to provider referral for case management and/or care coordination services.  ? ?Consent to Services:  ?The patient was given information about Chronic Care Management services, agreed to services, and gave verbal consent prior to initiation of services.  Please see initial visit note for detailed documentation.  ? ?Patient agreed to services and verbal consent obtained.  ? ?Assessment: Review of patient past medical history, allergies, medications, health status, including review of consultants reports, laboratory and other test data, was performed as part of comprehensive evaluation and provision of chronic care management services.  ? ?SDOH (Social Determinants of Health) assessments and interventions performed:   ? ?CCM Care Plan ? ?Allergies  ?Allergen Reactions  ? Lisinopril-Hydrochlorothiazide Swelling  ?  ZESTRIL  ? Doxycycline Rash  ? ? ?Outpatient Encounter Medications as of 03/01/2022  ?Medication Sig Note  ? acetaminophen (TYLENOL) 325 MG tablet Take 2 tablets (650 mg total) by mouth every 6 (six) hours as needed for mild pain (or Fever >/= 101).   ? amLODipine (NORVASC) 10 MG tablet Take 1 tablet (10 mg total) by mouth daily.   ? artificial tears (LACRILUBE) OINT ophthalmic ointment Place into both eyes every 4 (four) hours as needed for dry eyes.   ? B Complex-C (B-COMPLEX WITH VITAMIN C) tablet Take 1 tablet by mouth daily.   ? buPROPion (WELLBUTRIN XL) 150 MG 24 hr tablet Take 1 tablet (150 mg total) by mouth daily.   ? clonazePAM (KLONOPIN) 0.5 MG tablet TAKE 1/2 TABLET BY MOUTH 3 TIMES DAILY AS  NEEDED FOR ANXIETY.   ? clopidogrel (PLAVIX) 75 MG tablet Take 1 tablet (75 mg total) by mouth daily.   ? diclofenac Sodium (VOLTAREN) 1 % GEL Apply 2 g topically 4 (four) times daily.   ? ezetimibe (ZETIA) 10 MG tablet TAKE 1 TABLET BY MOUTH DAILY   ? FLUoxetine (PROZAC) 10 MG capsule Take 1 capsule (10 mg total) by mouth daily.   ? FLUoxetine (PROZAC) 20 MG capsule TAKE (1) CAPSULE BY MOUTH EACH MORNING.   ? fluticasone (FLONASE) 50 MCG/ACT nasal spray Place 2 sprays into both nostrils daily.   ? glipiZIDE (GLIPIZIDE XL) 5 MG 24 hr tablet Take 1 tablet (5 mg total) by mouth daily with breakfast.   ? hydrALAZINE (APRESOLINE) 100 MG tablet TAKE 1 TABLET BY MOUTH EVERY 8 HOURS.   ? magnesium gluconate (MAGONATE) 500 MG tablet Take 0.5 tablets (250 mg total) by mouth at bedtime. 11/11/2021: 11/11/21 son has to pick up  ? metoprolol succinate (TOPROL-XL) 25 MG 24 hr tablet Take 0.5 tablets (12.5 mg total) by mouth daily.   ? montelukast (SINGULAIR) 10 MG tablet Take 1 tablet (10 mg total) by mouth at bedtime.   ? rOPINIRole (REQUIP) 0.25 MG tablet Take 1 tablet (0.25 mg total) by mouth at bedtime.   ? rosuvastatin (CRESTOR) 20 MG tablet Take 1 tablet (20 mg total) by mouth daily.   ? sitaGLIPtin (JANUVIA) 100 MG tablet Take 1 tablet (100 mg total) by mouth daily.   ? valsartan (DIOVAN) 160 MG tablet Take 1 tablet (160 mg total) by  mouth 2 (two) times daily.   ? vitamin B-12 (CYANOCOBALAMIN) 1000 MCG tablet Take 1,000 mcg by mouth daily.   ? sulfamethoxazole-trimethoprim (BACTRIM DS) 800-160 MG tablet Take 1 tablet by mouth 2 (two) times daily. (Patient not taking: Reported on 02/23/2022)   ? ?No facility-administered encounter medications on file as of 03/01/2022.  ? ? ?Patient Active Problem List  ? Diagnosis Date Noted  ? Right thalamic infarction ([employer]) 07/31/2021  ? Stage 3a chronic kidney disease ([place])   ? Benign essential HTN   ? Left hemiparesis ([place])   ? Type 2 diabetes mellitus with hyperglycemia, without long-term  current use of insulin ([place])   ? CVA (cerebral vascular accident) ([place]) 07/27/2021  ? Frequent PVCs 03/20/2020  ? Aortic stenosis 03/20/2020  ? COPD (chronic obstructive pulmonary disease) ([place]) 03/20/2020  ? Constipation 04/30/2019  ? Protein-calorie malnutrition ([place]) 04/30/2019  ? Anxiety with depression 12/05/2018  ? Seasonal and perennial allergic rhinitis 08/09/2018  ? Carotid artery disease ([place]) 11/18/2017  ? Hx of colonic polyps 08/26/2015  ? Erectile dysfunction 02/17/2015  ? AAA (abdominal aortic aneurysm) without rupture ([place]) 05/21/2014  ? Chest pain 01/31/2014  ? CAD (coronary artery disease) 12/24/2013  ? HEARING LOSS, CONDUCTIVE, LEFT 02/23/2010  ? DIZZINESS, CHRONIC 05/28/2008  ? PULMONARY NODULE 09/07/2007  ? Diabetes mellitus type II, controlled ([place]) 09/06/2007  ? Hyperlipidemia 09/06/2007  ? TOBACCO ABUSE 09/06/2007  ? Essential hypertension 09/06/2007  ? Allergic rhinitis 09/06/2007  ? GERD 09/06/2007  ? BASAL CELL CARCINOMA, NOSE 09/06/2007  ? ? ?Conditions to be addressed/monitored:HTN and DMII ? ?Care Plan : RN Care Manager Plan of Care  ?Updates made by Kassie Mends, RN since 03/01/2022 12:00 AM  ?  ? ?Problem: No plan of care established for management of chronic disease states  (HTN, DM2, CVA, COPD)   ?Priority: High  ?  ? ?Long-Range Goal: Development of plan of care for chronic disease management  (HTN, DM2, CVA, COPD)   ?Start Date: 10/23/2021  ?Expected End Date: 08/28/2022  ?Priority: High  ?Note:   ?Current Barriers:  ?Knowledge Deficits related to plan of care for management of HTN and DMII  ?No Advanced Directives in place- patient's son states did receive advanced directives packet and plans to complete. ?Spoke with patient's son Vardaan Depascale DPR who reports pt is temporarily living with him and his wife, pt eventually wants to go back to his home but has not been able to yet. Home health PT discharged, they were working with him after being inpatient rehab after CVA back in  early October 2022.  Son reports pt has balance issues, left side deficits with mainly left leg affected which has led to some falls, has DME in the home and stair lift for going up to the shower, has hired help at son's home from 8am-1230 pm which has helped tremendously, reports pt is walking some now for exercise, there are deficits with short term memory per son. Patient finished antibiotics for dysuria. Son reports blood pressure is checked frequently with "readings good", reports CBG checked once daily with fasting ranges 90-130, random ranges all under 200, appetite is very good. Patient recently had changes to diabetes medication due to having some blood sugar readings in the 70's, this has helped and no further low readings recently. ?Patient PHQ-9=6, pt is on medication and recently wellbutrin added. LCSW previously declined. ? ?RNCM Clinical Goal(s):  ?Patient will verbalize understanding of plan for management of HTN and DMII as evidenced by  patient report, review of EHR and  through collaboration with RN Care manager, provider, and care team.  ? ?Interventions: ?1:1 collaboration with primary care provider regarding development and update of comprehensive plan of care as evidenced by provider attestation and co-signature ?Inter-disciplinary care team collaboration (see longitudinal plan of care) ?Evaluation of current treatment plan related to  self management and patient's adherence to plan as established by provider ? ? ?Diabetes Interventions:  (Status:  New goal. and Goal on track:  Yes.) Long Term Goal ?Assessed patient's understanding of A1c goal: <7% ?Reviewed medications with patient and discussed importance of medication adherence ?Counseled on importance of regular laboratory monitoring as prescribed ?Review of patient status, including review of consultants reports, relevant laboratory and other test results, and medications completed ?Reinforced carbohydrate modified diet, portion  control ?Reinforced the role of infection related to elevated CBG ?Lab Results  ?Component Value Date  ? HGBA1C 6.7 (H) 07/27/2021  ? ?Hypertension Interventions:  (Status:  New goal. and Goal on track:  Yes.) [place]

## 2022-03-03 DIAGNOSIS — I2510 Atherosclerotic heart disease of native coronary artery without angina pectoris: Secondary | ICD-10-CM | POA: Diagnosis not present

## 2022-03-03 DIAGNOSIS — J449 Chronic obstructive pulmonary disease, unspecified: Secondary | ICD-10-CM | POA: Diagnosis not present

## 2022-03-03 DIAGNOSIS — I129 Hypertensive chronic kidney disease with stage 1 through stage 4 chronic kidney disease, or unspecified chronic kidney disease: Secondary | ICD-10-CM | POA: Diagnosis not present

## 2022-03-03 DIAGNOSIS — E1122 Type 2 diabetes mellitus with diabetic chronic kidney disease: Secondary | ICD-10-CM | POA: Diagnosis not present

## 2022-03-03 DIAGNOSIS — I69354 Hemiplegia and hemiparesis following cerebral infarction affecting left non-dominant side: Secondary | ICD-10-CM | POA: Diagnosis not present

## 2022-03-03 DIAGNOSIS — N183 Chronic kidney disease, stage 3 unspecified: Secondary | ICD-10-CM | POA: Diagnosis not present

## 2022-03-08 ENCOUNTER — Other Ambulatory Visit: Payer: Self-pay | Admitting: Family Medicine

## 2022-03-08 DIAGNOSIS — I69354 Hemiplegia and hemiparesis following cerebral infarction affecting left non-dominant side: Secondary | ICD-10-CM | POA: Diagnosis not present

## 2022-03-08 DIAGNOSIS — J449 Chronic obstructive pulmonary disease, unspecified: Secondary | ICD-10-CM | POA: Diagnosis not present

## 2022-03-08 DIAGNOSIS — I129 Hypertensive chronic kidney disease with stage 1 through stage 4 chronic kidney disease, or unspecified chronic kidney disease: Secondary | ICD-10-CM | POA: Diagnosis not present

## 2022-03-08 DIAGNOSIS — E1122 Type 2 diabetes mellitus with diabetic chronic kidney disease: Secondary | ICD-10-CM | POA: Diagnosis not present

## 2022-03-08 DIAGNOSIS — I2510 Atherosclerotic heart disease of native coronary artery without angina pectoris: Secondary | ICD-10-CM | POA: Diagnosis not present

## 2022-03-08 DIAGNOSIS — N183 Chronic kidney disease, stage 3 unspecified: Secondary | ICD-10-CM | POA: Diagnosis not present

## 2022-03-09 NOTE — Telephone Encounter (Signed)
Requested Prescriptions  Pending Prescriptions Disp Refills  . hydrALAZINE (APRESOLINE) 100 MG tablet [Pharmacy Med Name: HYDRALAZINE 100 MG TABLET 100 Tablet] 90 tablet 1    Sig: TAKE 1 TABLET BY MOUTH EVERY 8 HOURS.     Cardiovascular:  Vasodilators Failed - 03/08/2022  9:42 AM      Failed - HGB in normal range and within 360 days    Hemoglobin  Date Value Ref Range Status  01/04/2022 13.0 (L) 13.2 - 17.1 g/dL Final         Failed - RBC in normal range and within 360 days    RBC  Date Value Ref Range Status  01/04/2022 4.12 (L) 4.20 - 5.80 Million/uL Final         Failed - WBC in normal range and within 360 days    WBC  Date Value Ref Range Status  01/04/2022 14.1 (H) 3.8 - 10.8 Thousand/uL Final         Failed - ANA Screen, Ifa, Serum in normal range and within 360 days    No results found for: ANA, ANATITER, LABANTI       Failed - Last BP in normal range    BP Readings from Last 1 Encounters:  02/23/22 140/68         Passed - HCT in normal range and within 360 days    HCT  Date Value Ref Range Status  01/04/2022 39.7 38.5 - 50.0 % Final         Passed - PLT in normal range and within 360 days    Platelets  Date Value Ref Range Status  01/04/2022 237 140 - 400 Thousand/uL Final         Passed - Valid encounter within last 12 months    Recent Outpatient Visits          2 weeks ago Type 2 diabetes mellitus with hyperglycemia, without long-term current use of insulin ([place])   [place] Pickard, Cammie Mcgee, MD   2 months ago [place], Cammie Mcgee, MD   4 months ago Essential hypertension   [place], Cammie Mcgee, MD   5 months ago Mass of parotid gland   [place] Pickard, Cammie Mcgee, MD   1 year ago Dizziness and giddiness   [place] Pickard, Cammie Mcgee, MD

## 2022-03-17 DIAGNOSIS — Z87891 Personal history of nicotine dependence: Secondary | ICD-10-CM | POA: Diagnosis not present

## 2022-03-17 DIAGNOSIS — I10 Essential (primary) hypertension: Secondary | ICD-10-CM

## 2022-03-17 DIAGNOSIS — E1165 Type 2 diabetes mellitus with hyperglycemia: Secondary | ICD-10-CM

## 2022-03-17 DIAGNOSIS — Z7984 Long term (current) use of oral hypoglycemic drugs: Secondary | ICD-10-CM

## 2022-03-18 DIAGNOSIS — I129 Hypertensive chronic kidney disease with stage 1 through stage 4 chronic kidney disease, or unspecified chronic kidney disease: Secondary | ICD-10-CM | POA: Diagnosis not present

## 2022-03-18 DIAGNOSIS — N183 Chronic kidney disease, stage 3 unspecified: Secondary | ICD-10-CM | POA: Diagnosis not present

## 2022-03-18 DIAGNOSIS — J449 Chronic obstructive pulmonary disease, unspecified: Secondary | ICD-10-CM | POA: Diagnosis not present

## 2022-03-18 DIAGNOSIS — E1122 Type 2 diabetes mellitus with diabetic chronic kidney disease: Secondary | ICD-10-CM | POA: Diagnosis not present

## 2022-03-18 DIAGNOSIS — I2510 Atherosclerotic heart disease of native coronary artery without angina pectoris: Secondary | ICD-10-CM | POA: Diagnosis not present

## 2022-03-18 DIAGNOSIS — I69354 Hemiplegia and hemiparesis following cerebral infarction affecting left non-dominant side: Secondary | ICD-10-CM | POA: Diagnosis not present

## 2022-03-24 DIAGNOSIS — M2041 Other hammer toe(s) (acquired), right foot: Secondary | ICD-10-CM | POA: Diagnosis not present

## 2022-03-24 DIAGNOSIS — M21372 Foot drop, left foot: Secondary | ICD-10-CM | POA: Diagnosis not present

## 2022-03-24 DIAGNOSIS — B351 Tinea unguium: Secondary | ICD-10-CM | POA: Diagnosis not present

## 2022-03-24 DIAGNOSIS — E0840 Diabetes mellitus due to underlying condition with diabetic neuropathy, unspecified: Secondary | ICD-10-CM | POA: Diagnosis not present

## 2022-03-24 DIAGNOSIS — M79674 Pain in right toe(s): Secondary | ICD-10-CM | POA: Diagnosis not present

## 2022-04-26 DIAGNOSIS — M6281 Muscle weakness (generalized): Secondary | ICD-10-CM | POA: Diagnosis not present

## 2022-04-26 DIAGNOSIS — R262 Difficulty in walking, not elsewhere classified: Secondary | ICD-10-CM | POA: Diagnosis not present

## 2022-04-26 DIAGNOSIS — I6340 Cerebral infarction due to embolism of unspecified cerebral artery: Secondary | ICD-10-CM | POA: Diagnosis not present

## 2022-05-01 ENCOUNTER — Other Ambulatory Visit: Payer: Self-pay | Admitting: Family Medicine

## 2022-05-03 DIAGNOSIS — I6340 Cerebral infarction due to embolism of unspecified cerebral artery: Secondary | ICD-10-CM | POA: Diagnosis not present

## 2022-05-03 DIAGNOSIS — M6281 Muscle weakness (generalized): Secondary | ICD-10-CM | POA: Diagnosis not present

## 2022-05-03 DIAGNOSIS — R262 Difficulty in walking, not elsewhere classified: Secondary | ICD-10-CM | POA: Diagnosis not present

## 2022-05-03 NOTE — Telephone Encounter (Signed)
Requested Prescriptions  Pending Prescriptions Disp Refills  . buPROPion (WELLBUTRIN XL) 150 MG 24 hr tablet [Pharmacy Med Name: BUPROPION HCL XL 150 MG TAB 150 Tablet] 30 tablet 2    Sig: TAKE 1 TABLET (150 MG TOTAL) BY MOUTH DAILY.     Psychiatry: Antidepressants - bupropion Failed - 05/01/2022 11:09 AM      Failed - Cr in normal range and within 360 days    Creat  Date Value Ref Range Status  01/04/2022 1.34 (H) 0.70 - 1.22 mg/dL Final   Creatinine, Urine  Date Value Ref Range Status  03/19/2021 116 20 - 320 mg/dL Final         Failed - Last BP in normal range    BP Readings from Last 1 Encounters:  02/23/22 140/68         Passed - AST in normal range and within 360 days    AST  Date Value Ref Range Status  08/03/2021 15 15 - 41 U/L Final         Passed - ALT in normal range and within 360 days    ALT  Date Value Ref Range Status  08/03/2021 13 0 - 44 U/L Final         Passed - Completed PHQ-2 or PHQ-9 in the last 360 days      Passed - Valid encounter within last 6 months    Recent Outpatient Visits          2 months ago Type 2 diabetes mellitus with hyperglycemia, without long-term current use of insulin ([place])   [place] Pickard, Cammie Mcgee, MD   3 months ago [place], Cammie Mcgee, MD   6 months ago Essential hypertension   [place], Cammie Mcgee, MD   7 months ago Mass of parotid gland   [place] Pickard, Cammie Mcgee, MD   1 year ago Dizziness and giddiness   [place] Pickard, Cammie Mcgee, MD

## 2022-05-08 ENCOUNTER — Other Ambulatory Visit: Payer: Self-pay | Admitting: Family Medicine

## 2022-05-12 DIAGNOSIS — R262 Difficulty in walking, not elsewhere classified: Secondary | ICD-10-CM | POA: Diagnosis not present

## 2022-05-12 DIAGNOSIS — I6340 Cerebral infarction due to embolism of unspecified cerebral artery: Secondary | ICD-10-CM | POA: Diagnosis not present

## 2022-05-12 DIAGNOSIS — M6281 Muscle weakness (generalized): Secondary | ICD-10-CM | POA: Diagnosis not present

## 2022-05-14 ENCOUNTER — Telehealth: Payer: Self-pay

## 2022-05-14 NOTE — Telephone Encounter (Signed)
Pt's son, Darryle, called stating that pt's feet has a painful tingling feeling, especially the bottom

## 2022-05-17 ENCOUNTER — Other Ambulatory Visit: Payer: Self-pay | Admitting: Family Medicine

## 2022-05-17 DIAGNOSIS — M6281 Muscle weakness (generalized): Secondary | ICD-10-CM | POA: Diagnosis not present

## 2022-05-17 DIAGNOSIS — I6340 Cerebral infarction due to embolism of unspecified cerebral artery: Secondary | ICD-10-CM | POA: Diagnosis not present

## 2022-05-17 DIAGNOSIS — R262 Difficulty in walking, not elsewhere classified: Secondary | ICD-10-CM | POA: Diagnosis not present

## 2022-05-17 MED ORDER — GABAPENTIN 300 MG PO CAPS: 300.0000 mg | ORAL_CAPSULE | Freq: Two times a day (BID) | ORAL | 3 refills | Status: DC | PRN

## 2022-05-17 NOTE — Telephone Encounter (Signed)
Spoke with son today, yes, pt wants to try gabapentin. What dose ?  Pls advice

## 2022-05-24 ENCOUNTER — Ambulatory Visit (INDEPENDENT_AMBULATORY_CARE_PROVIDER_SITE_OTHER): Payer: Medicare Other | Admitting: *Deleted

## 2022-05-24 DIAGNOSIS — E1165 Type 2 diabetes mellitus with hyperglycemia: Secondary | ICD-10-CM

## 2022-05-24 DIAGNOSIS — I6340 Cerebral infarction due to embolism of unspecified cerebral artery: Secondary | ICD-10-CM | POA: Diagnosis not present

## 2022-05-24 DIAGNOSIS — I10 Essential (primary) hypertension: Secondary | ICD-10-CM

## 2022-05-24 DIAGNOSIS — M6281 Muscle weakness (generalized): Secondary | ICD-10-CM | POA: Diagnosis not present

## 2022-05-24 DIAGNOSIS — R262 Difficulty in walking, not elsewhere classified: Secondary | ICD-10-CM | POA: Diagnosis not present

## 2022-05-24 NOTE — Chronic Care Management (AMB) (Signed)
Chronic Care Management   CCM RN Visit Note  05/24/2022 Name: Barry [name] MRN: [id number] DOB: 12-22-1932  Subjective: Barry [name] is a 88 y.o. year old male who is a primary care patient of Pickard, Barry Mcgee, MD. The care management team was consulted for assistance with disease management and care coordination needs.    Engaged with patient by telephone for follow up visit in response to provider referral for case management and/or care coordination services.   Consent to Services:  The patient was given information about Chronic Care Management services, agreed to services, and gave verbal consent prior to initiation of services.  Please see initial visit note for detailed documentation.   Patient agreed to services and verbal consent obtained.   Assessment: Review of patient past medical history, allergies, medications, health status, including review of consultants reports, laboratory and other test data, was performed as part of comprehensive evaluation and provision of chronic care management services.   SDOH (Social Determinants of Health) assessments and interventions performed:    CCM Care Plan  Allergies  Allergen Reactions   Lisinopril-Hydrochlorothiazide Swelling    ZESTRIL   Doxycycline Rash    Outpatient Encounter Medications as of 05/24/2022  Medication Sig Note   acetaminophen (TYLENOL) 325 MG tablet Take 2 tablets (650 mg total) by mouth every 6 (six) hours as needed for mild pain (or Fever >/= 101).    amLODipine (NORVASC) 10 MG tablet Take 1 tablet (10 mg total) by mouth daily.    artificial tears (LACRILUBE) OINT ophthalmic ointment Place into both eyes every 4 (four) hours as needed for dry eyes.    B Complex-C (B-COMPLEX WITH VITAMIN C) tablet Take 1 tablet by mouth daily.    buPROPion (WELLBUTRIN XL) 150 MG 24 hr tablet TAKE 1 TABLET (150 MG TOTAL) BY MOUTH DAILY.    clonazePAM (KLONOPIN) 0.5 MG tablet TAKE 1/2 TABLET BY MOUTH 3 TIMES DAILY AS  NEEDED FOR ANXIETY.    clopidogrel (PLAVIX) 75 MG tablet Take 1 tablet (75 mg total) by mouth daily.    diclofenac Sodium (VOLTAREN) 1 % GEL Apply 2 g topically 4 (four) times daily.    ezetimibe (ZETIA) 10 MG tablet TAKE 1 TABLET BY MOUTH DAILY    FLUoxetine (PROZAC) 10 MG capsule Take 1 capsule (10 mg total) by mouth daily.    FLUoxetine (PROZAC) 20 MG capsule TAKE (1) CAPSULE BY MOUTH EACH MORNING.    fluticasone (FLONASE) 50 MCG/ACT nasal spray Place 2 sprays into both nostrils daily.    gabapentin (NEURONTIN) 300 MG capsule Take 1 capsule (300 mg total) by mouth 2 (two) times daily as needed (nerve pain).    glipiZIDE (GLIPIZIDE XL) 5 MG 24 hr tablet Take 1 tablet (5 mg total) by mouth daily with breakfast.    hydrALAZINE (APRESOLINE) 100 MG tablet TAKE 1 TABLET BY MOUTH EVERY 8 HOURS.    magnesium gluconate (MAGONATE) 500 MG tablet Take 0.5 tablets (250 mg total) by mouth at bedtime. 11/11/2021: 11/11/21 son has to pick up   metoprolol succinate (TOPROL-XL) 25 MG 24 hr tablet Take 0.5 tablets (12.5 mg total) by mouth daily.    montelukast (SINGULAIR) 10 MG tablet Take 1 tablet (10 mg total) by mouth at bedtime.    rOPINIRole (REQUIP) 0.25 MG tablet Take 1 tablet (0.25 mg total) by mouth at bedtime.    rosuvastatin (CRESTOR) 20 MG tablet Take 1 tablet (20 mg total) by mouth daily.    sitaGLIPtin (JANUVIA) 100 MG tablet  Take 1 tablet (100 mg total) by mouth daily.    valsartan (DIOVAN) 160 MG tablet Take 1 tablet (160 mg total) by mouth 2 (two) times daily.    vitamin B-12 (CYANOCOBALAMIN) 1000 MCG tablet Take 1,000 mcg by mouth daily.    sulfamethoxazole-trimethoprim (BACTRIM DS) 800-160 MG tablet Take 1 tablet by mouth 2 (two) times daily. (Patient not taking: Reported on 02/23/2022)    No facility-administered encounter medications on file as of 05/24/2022.    Patient Active Problem List   Diagnosis Date Noted   Right thalamic infarction ([place]) 07/31/2021   Stage 3a chronic kidney disease  ([place])    Benign essential HTN    Left hemiparesis ([place])    Type 2 diabetes mellitus with hyperglycemia, without long-term current use of insulin ([facility])    CVA (cerebral vascular accident) ([place]) 07/27/2021   Frequent PVCs 03/20/2020   Aortic stenosis 03/20/2020   COPD (chronic obstructive pulmonary disease) ([place]) 03/20/2020   Constipation 04/30/2019   Protein-calorie malnutrition ([place]) 04/30/2019   Anxiety with depression 12/05/2018   Seasonal and perennial allergic rhinitis 08/09/2018   Carotid artery disease ([place]) 11/18/2017   Hx of colonic polyps 08/26/2015   Erectile dysfunction 02/17/2015   AAA (abdominal aortic aneurysm) without rupture ([place]) 05/21/2014   Chest pain 01/31/2014   CAD (coronary artery disease) 12/24/2013   HEARING LOSS, CONDUCTIVE, LEFT 02/23/2010   DIZZINESS, CHRONIC 05/28/2008   PULMONARY NODULE 09/07/2007   Diabetes mellitus type II, controlled ([place]) 09/06/2007   Hyperlipidemia 09/06/2007   TOBACCO ABUSE 09/06/2007   Essential hypertension 09/06/2007   Allergic rhinitis 09/06/2007   GERD 09/06/2007   BASAL CELL CARCINOMA, NOSE 09/06/2007    Conditions to be addressed/monitored:HTN and DMII  Care Plan : RN Care Manager Plan of Care  Updates made by Kassie Mends, RN since 05/24/2022 12:00 AM  Completed 05/24/2022   Problem: No plan of care established for management of chronic disease states  (HTN, DM2, CVA, COPD) Resolved 05/24/2022  Priority: High     Long-Range Goal: Development of plan of care for chronic disease management  (HTN, DM2, CVA, COPD) Completed 05/24/2022  Start Date: 10/23/2021  Expected End Date: 08/28/2022  Priority: High  Note:   Current Barriers:  Knowledge Deficits related to plan of care for management of HTN and DMII  No Advanced Directives in place- patient's son states did receive advanced directives packet and plans to complete. Spoke with patient's son [name] DPR who reports pt continues to live with him and his wife.  Home health PT discharged, they were working with him after being inpatient rehab after CVA back in early October 2022.  Son reports pt has balance issues, left side deficits with mainly left leg affected which has led to some falls, has DME in the home and stair lift for going up to the shower, has hired help at son's home from 8am-1230 pm which has helped tremendously, reports pt is walking some now for exercise, there are deficits with short term memory per son. Patient finished antibiotics for dysuria. Son reports blood pressure is checked frequently with "readings good", reports CBG checked once daily with fasting ranges 80-135, random ranges all under 200, appetite is very good. Patient recently had changes to diabetes medication due to having some blood sugar readings in the 70's, this has helped, had a reading of 80 1-2 weeks ago. Patient PHQ-9=6, pt is on medication and recently wellbutrin added. LCSW previously declined. Patient's son reports no new changes, states "  we are doing well, he's well taken care of"  RNCM Clinical Goal(s):  Patient will verbalize understanding of plan for management of HTN and DMII as evidenced by patient report, review of EHR and  through collaboration with RN Care manager, provider, and care team.   Interventions: 1:1 collaboration with primary care provider regarding development and update of comprehensive plan of care as evidenced by provider attestation and co-signature Inter-disciplinary care team collaboration (see longitudinal plan of care) Evaluation of current treatment plan related to  self management and patient's adherence to plan as established by provider   Diabetes Interventions:  (Status:  New goal. and Goal on track:  Yes.) Long Term Goal Assessed patient's understanding of A1c goal: <7% Reviewed medications with patient and discussed importance of medication adherence Counseled on importance of regular laboratory monitoring as  prescribed Review of patient status, including review of consultants reports, relevant laboratory and other test results, and medications completed Reviewed carbohydrate modified diet, portion control Reviewed the role of infection related to elevated CBG Reviewed sign/symptoms hypoglycemia and treatment Reviewed plan of care with patient's son including case closure today, son verbalizes understanding and is in agreement Lab Results  Component Value Date   HGBA1C 6.7 (H) 07/27/2021   Hypertension Interventions:  (Status:  New goal. and Goal on track:  Yes.) Long Term Goal Last practice recorded BP readings:  BP Readings from Last 3 Encounters:  10/05/21 (!) 163/91  10/02/21 (!) 142/78  08/26/21 (!) 147/67  Most recent eGFR/CrCl: No results found for: EGFR  No components found for: CRCL  Evaluation of current treatment plan related to hypertension self management and patient's adherence to plan as established by provider Reviewed medications with patient and discussed importance of compliance Advised patient, providing education and rationale, to monitor blood pressure daily and record, calling PCP for findings outside established parameters Discussed complications of poorly controlled blood pressure such as heart disease, stroke, circulatory complications, vision complications, kidney impairment, sexual dysfunction  Reviewed importance of following low sodium diet and reading food labels Reinforced importance of using DME in the home, completing exercised prescribed by PT Reviewed safety precautions  Patient Goals/Self-Care Activities: Take medications as prescribed   Attend all scheduled provider appointments Call provider office for new concerns or questions  check blood sugar at prescribed times: twice daily check feet daily for cuts, sores or redness enter blood sugar readings and medication or insulin into daily log take the blood sugar log to all doctor visits take the blood  sugar meter to all doctor visits trim toenails straight across fill half of plate with vegetables limit fast food meals to no more than 1 per week manage portion size read food labels for fat, fiber, carbohydrates and portion size keep feet up while sitting wash and dry feet carefully every day wear comfortable, cotton socks wear comfortable, well-fitting shoes check blood pressure daily write blood pressure results in a log or diary keep a blood pressure log take blood pressure log to all doctor appointments call doctor for signs and symptoms of high blood pressure keep all doctor appointments take medications for blood pressure exactly as prescribed report new symptoms to your doctor Limit fast food, read labels Practice portion control Continue following carbohydrate modified and low sodium diet Continue to complete prescribed exercises (given by PT) daily Always ask for assistance as needed fall prevention strategies: change position slowly, use assistive device such as walker or cane (per provider recommendations) when walking, keep walkways clear, have good lighting  in room. It is important to contact your provider if you have any falls, maintain muscle strength/tone by exercise per provider recommendations. Follow RULE OF 15 for low blood sugar management:  How to treat low blood sugars (Blood sugar less than 70 mg/dl  Please follow the RULE OF 15 for the treatment of hypoglycemia treatment (When your blood sugars are less than 70 mg/ dl) STEP  1:  Take 15 grams of carbohydrates when your blood sugar is low, which includes:   3-4 glucose tabs or  3-4 oz of juice or regular soda or  One tube of glucose gel STEP 2:  Recheck blood sugar in 15 minutes STEP 3:  If your blood sugar is still low at the 15 minute recheck ---then, go back to STEP 1 and treat again with another 15 grams of carbohydrates       Plan:No further follow up required: case closure today  Jacqlyn Larsen  [facility], BSN RN Case Manager [facility] [phone]

## 2022-05-24 NOTE — Patient Instructions (Signed)
Visit Information  Thank you for taking time to visit with me today. Please don't hesitate to contact me if I can be of assistance to you before our next scheduled telephone appointment.  Following are the goals we discussed today:  Take medications as prescribed   Attend all scheduled provider appointments Call provider office for new concerns or questions  check blood sugar at prescribed times: twice daily check feet daily for cuts, sores or redness enter blood sugar readings and medication or insulin into daily log take the blood sugar log to all doctor visits take the blood sugar meter to all doctor visits trim toenails straight across fill half of plate with vegetables limit fast food meals to no more than 1 per week manage portion size read food labels for fat, fiber, carbohydrates and portion size keep feet up while sitting wash and dry feet carefully every day wear comfortable, cotton socks wear comfortable, well-fitting shoes check blood pressure daily write blood pressure results in a log or diary keep a blood pressure log take blood pressure log to all doctor appointments call doctor for signs and symptoms of high blood pressure keep all doctor appointments take medications for blood pressure exactly as prescribed report new symptoms to your doctor Limit fast food, read labels Practice portion control Continue following carbohydrate modified and low sodium diet Continue to complete prescribed exercises (given by PT) daily Always ask for assistance as needed fall prevention strategies: change position slowly, use assistive device such as walker or cane (per provider recommendations) when walking, keep walkways clear, have good lighting in room. It is important to contact your provider if you have any falls, maintain muscle strength/tone by exercise per provider recommendations. Follow RULE OF 15 for low blood sugar management:  How to treat low blood sugars (Blood sugar  less than 70 mg/dl  Please follow the RULE OF 15 for the treatment of hypoglycemia treatment (When your blood sugars are less than 70 mg/ dl) STEP  1:  Take 15 grams of carbohydrates when your blood sugar is low, which includes:   3-4 glucose tabs or  3-4 oz of juice or regular soda or  One tube of glucose gel STEP 2:  Recheck blood sugar in 15 minutes STEP 3:  If your blood sugar is still low at the 15 minute recheck ---then, go back to STEP 1 and treat again with another 15 grams of carbohydrates   Please call the care guide team at [phone] if you need to cancel or reschedule your appointment.   If you are experiencing a Mental Health or [place] or need someone to talk to, please call the Suicide and Crisis Lifeline: 988 call the [place] National Suicide Prevention Lifeline: 908-767-5609 or TTY: 5172655155 TTY 7315158410) to talk to a trained counselor call 1-800-273-TALK (toll free, 24 hour hotline) go to [facility] [address], [place] [phone]) call the [place]: [phone] call 911   Patient verbalizes understanding of instructions and care plan provided today and agrees to view in [place]. Active MyChart status and patient understanding of how to access instructions and care plan via MyChart confirmed with patient.     Jacqlyn Larsen RNC, BSN RN Case Manager [facility] [phone]

## 2022-05-25 ENCOUNTER — Telehealth: Payer: Self-pay | Admitting: Diagnostic Neuroimaging

## 2022-05-25 DIAGNOSIS — I63433 Cerebral infarction due to embolism of bilateral posterior cerebral arteries: Secondary | ICD-10-CM

## 2022-05-25 DIAGNOSIS — G8194 Hemiplegia, unspecified affecting left nondominant side: Secondary | ICD-10-CM

## 2022-05-25 NOTE — Telephone Encounter (Signed)
Called son, Darryl who stated his dad hasn't had much improvement at all. He did have some in home care PT/OT once a week, but they discharged him.   Son has been paying for PT but therapist stated she's done all she could do, stated he needs specialty PT like what he can get here. Darryl stated he was walking some but now his balance if off, he is weak on left side, not much strength in legs, and he c/o vertigo.  I advised Dr Leta Baptist may feel he needs to see patient again. I will send message to him and call son back. He verbalized understanding, appreciation.

## 2022-05-25 NOTE — Telephone Encounter (Signed)
Pt's son called wanting to know if an order for PT next door can be put in for his father due to his stroke. Please advise.

## 2022-05-26 ENCOUNTER — Encounter: Payer: Self-pay | Admitting: *Deleted

## 2022-06-07 ENCOUNTER — Ambulatory Visit: Payer: Medicare Other | Admitting: Physical Therapy

## 2022-06-07 ENCOUNTER — Ambulatory Visit: Payer: Medicare Other | Attending: Diagnostic Neuroimaging | Admitting: Occupational Therapy

## 2022-06-07 ENCOUNTER — Other Ambulatory Visit: Payer: Self-pay

## 2022-06-07 ENCOUNTER — Encounter: Payer: Self-pay | Admitting: Physical Therapy

## 2022-06-07 DIAGNOSIS — M6281 Muscle weakness (generalized): Secondary | ICD-10-CM | POA: Insufficient documentation

## 2022-06-07 DIAGNOSIS — R293 Abnormal posture: Secondary | ICD-10-CM

## 2022-06-07 DIAGNOSIS — R278 Other lack of coordination: Secondary | ICD-10-CM | POA: Diagnosis not present

## 2022-06-07 DIAGNOSIS — R2689 Other abnormalities of gait and mobility: Secondary | ICD-10-CM

## 2022-06-07 DIAGNOSIS — R2681 Unsteadiness on feet: Secondary | ICD-10-CM | POA: Diagnosis not present

## 2022-06-07 DIAGNOSIS — I69354 Hemiplegia and hemiparesis following cerebral infarction affecting left non-dominant side: Secondary | ICD-10-CM | POA: Diagnosis not present

## 2022-06-07 NOTE — Therapy (Signed)
OUTPATIENT PHYSICAL THERAPY NEURO EVALUATION   Patient Name: Barry [name] MRN: [id number] DOB:[birth year], 88 y.o., male Today's Date: 06/07/2022   PCP: Jenna Luo, MD REFERRING PROVIDER: Penni Bombard, MD   PT End of Session - 06/07/22 1105     Visit Number 1    Number of Visits 16    Date for PT Re-Evaluation 07/30/22    Authorization Type Medicare/[employer]    Progress Note Due on Visit 10    PT Start Time 1103    PT Stop Time 1146    PT Time Calculation (min) 43 min    Equipment Utilized During Treatment Gait belt    Activity Tolerance Patient tolerated treatment well    Behavior During Therapy [facility] for tasks assessed/performed             Past Medical History:  Diagnosis Date   AAA (abdominal aortic aneurysm) ([place]) 12/17/2007   4.7 cm   Allergy    rhinitis   Anxiety    Cancer ([place])    skin   Cataract    Depression    Diabetes mellitus    Diverticulosis    GERD (gastroesophageal reflux disease)    Hepatitis    Hx of colonic polyps 08/26/2015   Hyperlipidemia    Hypertension    Nephrolithiasis    Personal history of colonic polyps    adenomas, serrated also   PVD (peripheral vascular disease) ([place])    Stroke ([place])    Tobacco abuse    Past Surgical History:  Procedure Laterality Date   COLONOSCOPY  2006, 2008, 2010, 05/27/2011   numerous adenomas - 13 in 2006, 3, 2008, 2 2012 (up to 1 cm), 4 diminutive adenomas, serrated adenomas 2012. Diverticulosis and hemorrhoids also.   EYE SURGERY     EYE SURGERY Left    Kidney stone operation     lower limb amputation, other toe 5th     percutaneous stent graft repair of infrarenal AAA  11/10   5.6 cm (T. early)   surgical excision basal cell carcinoma     TEE WITHOUT CARDIOVERSION N/A 07/29/2021   Procedure: TRANSESOPHAGEAL ECHOCARDIOGRAM (TEE);  Surgeon: Geralynn Rile, MD;  Location: [facility];  Service: Cardiovascular;  Laterality: N/A;   tympanic eardrum repair     VASECTOMY      Patient Active Problem List   Diagnosis Date Noted   Right thalamic infarction ([place]) 07/31/2021   Stage 3a chronic kidney disease ([place])    Benign essential HTN    Left hemiparesis ([place])    Type 2 diabetes mellitus with hyperglycemia, without long-term current use of insulin ([place])    CVA (cerebral vascular accident) ([place]) 07/27/2021   Frequent PVCs 03/20/2020   Aortic stenosis 03/20/2020   COPD (chronic obstructive pulmonary disease) ([place]) 03/20/2020   Constipation 04/30/2019   Protein-calorie malnutrition ([place]) 04/30/2019   Anxiety with depression 12/05/2018   Seasonal and perennial allergic rhinitis 08/09/2018   Carotid artery disease ([place]) 11/18/2017   Hx of colonic polyps 08/26/2015   Erectile dysfunction 02/17/2015   AAA (abdominal aortic aneurysm) without rupture ([place]) 05/21/2014   Chest pain 01/31/2014   CAD (coronary artery disease) 12/24/2013   HEARING LOSS, CONDUCTIVE, LEFT 02/23/2010   DIZZINESS, CHRONIC 05/28/2008   PULMONARY NODULE 09/07/2007   Diabetes mellitus type II, controlled ([place]) 09/06/2007   Hyperlipidemia 09/06/2007   TOBACCO ABUSE 09/06/2007   Essential hypertension 09/06/2007   Allergic rhinitis 09/06/2007   GERD 09/06/2007   BASAL CELL CARCINOMA,  NOSE 09/06/2007    ONSET DATE: 05/26/2022 (MD referral)  REFERRING DIAG:  F81.829 (ICD-10-CM) - Cerebrovascular accident (CVA) due to bilateral embolism of posterior cerebral arteries ([place])  G81.94 (ICD-10-CM) - Left hemiparesis ([place])    THERAPY DIAG:  Other abnormalities of gait and mobility  Muscle weakness (generalized)  Unsteadiness on feet  Abnormal posture  Rationale for Evaluation and Treatment Rehabilitation  SUBJECTIVE:                                                                                                                                                                                              SUBJECTIVE STATEMENT: "The main thing is my left leg.  If I can get that leg  moving, I'll be okay."  Had the stroke in October 2022; after the hospital, had some people come to the house for therapy.  Can walk with a walker a little bit.  Have to have son with me when I'm standing up; feel very unsteady with standing. Pt accompanied by: self  PERTINENT HISTORY: PMH:  AAA, anxiety, depression, skin cancer, DM, GERD, HLD, HTN, CVA, CAD, COPD  PAIN:  Are you having pain? Yes: NPRS scale: 5/10 Pain location: L shoulder Pain description: sore Aggravating factors: lying on L side Relieving factors: cream  PRECAUTIONS: Fall  WEIGHT BEARING RESTRICTIONS No  FALLS: Has patient fallen in last 6 months? No  LIVING ENVIRONMENT: Lives with: lives with their son Lives in: House/apartment Stairs: Yes: External: 6 steps; on right going up and on left going up Has following equipment at home: Gilford Rile - 2 wheeled and Wheelchair (manual).  Also has transport w/c.  Has foot bike  PLOF: Independent  PATIENT GOALS Would like to walk better and get left leg stronger  OBJECTIVE:   DIAGNOSTIC FINDINGS: Presented 07/27/2021 to [facility] with acute onset of left-sided weakness.  CT/MRI showed acute right posterior lentiform nucleus, thalamus, anterior caudate infarct with additional punctate infarct in the lateral left thalamus and possible the left lentiform.  CT angiogram head and neck with mild atherosclerotic plaque of the bilateral carotid bulbs resulting in 30 to 40% stenosis on the right.  1 cm enhancing lesion in the right parotid gland indeterminate recommend ENT follow-up as outpatient.  MRA of the head showed no significant proximal stenosis aneurysm or occlusion.  Echocardiogram with ejection fraction of 60 to 93% grade 1 diastolic dysfunction.  TEE showed no left atrial appendage or thrombus detected.  No PFO.  (Per 07/2021 notes)  COGNITION: Overall cognitive status: Within functional limits for tasks assessed   SENSATION: Light touch: WFL Proprioception:  WFL  COORDINATION:  Decreased coordination, slowed movement pattern, LLE   MUSCLE TONE: LLE: Moderate   POSTURE: rounded shoulders and forward head  LOWER EXTREMITY ROM:     Active  Right Eval Left Eval  Hip flexion    Hip extension    Hip abduction    Hip adduction    Hip internal rotation    Hip external rotation    Knee flexion    Knee extension  Decreased full AROM (-24 degrees); passive -5 degrees  Ankle dorsiflexion    Ankle plantarflexion    Ankle inversion    Ankle eversion     (Blank rows = not tested)  LOWER EXTREMITY MMT:    MMT Right Eval Left Eval  Hip flexion 5 4  Hip extension    Hip abduction 5 4  Hip adduction 5 4  Hip internal rotation    Hip external rotation    Knee flexion 5 3+  Knee extension 5 3-  Ankle dorsiflexion 4 3+  Ankle plantarflexion    Ankle inversion  3+  Ankle eversion  3+  (Blank rows = not tested)   TRANSFERS: Assistive device utilized: Environmental consultant - 2 wheeled  Sit to stand: Min A Stand to sit: Min A LUE at walker, RUE pushes up from transport chair.  At times, he attempts to push up with BUEs on walker; he sits, keeping BUEs at walker.  Cues provided for correct hand placement for improved sit<>stand technique  GAIT: Gait pattern: step through pattern, decreased step length- Left, decreased ankle dorsiflexion- Left, knee flexed in stance- Right, knee flexed in stance- Left, scissoring, narrow BOS, and poor foot clearance- Left Distance walked: 40 ft Assistive device utilized: Environmental consultant - 2 wheeled Level of assistance: Min A Comments: 32.94 sec in 10 meter walk:  0.99 ft/sec  FUNCTIONAL TESTs:  5 times sit to stand: 27.97 sec with BUE support Timed up and go (TUG): 39.12 sec with RW Berg Balance Scale: 14/56 (Scores <45/56 indicate increased fall risk  PATIENT SURVEYS:  FOTO NA due to CVA >6 months ago  TODAY'S TREATMENT:  Sit<>stand from elevated mat height, simulating bed, x 5 reps, with cues for hand placement,  foot placement; cues upon standing for glut/quad activation, upright posture.  HEP initiated-see below   PATIENT EDUCATION: Education details: Eval results, POC; initiated HEP-hand placement, safety awareness with sit<>Stand transfers Person educated: Patient Education method: Explanation, Demonstration, Tactile cues, Verbal cues, and Handouts Education comprehension: verbalized understanding, returned demonstration, verbal cues required, and needs further education   HOME EXERCISE PROGRAM: Access Code: [email or web address] URL: [email or web address] Date: 06/07/2022 Prepared by: [facility]  Exercises - Sit to Stand with Armchair  - 1-2 x daily - 7 x weekly - 1 sets - 5 reps - 3 sec hold    GOALS: Goals reviewed with patient? Yes  SHORT TERM GOALS: Target date: 07/02/2022  Pt will be supervision with HEP for improved strength, balance, gait. Baseline: Goal status: INITIAL  2.  Pt will improve 5x sit<>stand to less than or equal to 20 sec to demonstrate improved functional strength and transfer efficiency.  Baseline: 27.97 sec with definite UE support Goal status: INITIAL  3.  Pt will improve Berg score to at least 22/56 to decrease fall risk. Baseline: 14/56 Goal status: INITIAL   LONG TERM GOALS: Target date: 07/30/2022  Pt will be independent with HEP for improved strength, balance, transfers, and gait. Baseline:  Goal status: INITIAL  2.  Pt will  improve 5x sit<>stand to less than or equal to 15 sec to demonstrate improved functional strength and transfer efficiency. Baseline: 27.97 sec BUE support at eval Goal status: INITIAL  3.  Pt will improve TUG score to less than or equal to 20 sec for decreased fall risk. Baseline: 39.12 sec with RW Goal status: INITIAL  4.  Pt will improve gait velocity to at least 1.8 ft/sec for improved gait efficiency and safety. Baseline: 0.99 ft/sec Goal status: INITIAL  5.  Pt will  improve Berg score to at least 32/56 to decrease fall risk. Baseline: 14/56 Goal status: INITIAL  ASSESSMENT:  CLINICAL IMPRESSION: Patient is an 88 y.o. male who was seen today for physical therapy evaluation and treatment following CVA 07/2021.  Pt reports having had home therapies after returning home from hospital.  He is currently using w/c as primary means of mobility, using BLEs to propel in home.  He uses RW with son's assistance.  Prior to CVA, he was fully independent.  He presents with decreased strength, decreased balance, decreased independence with gait, decreased flexibility, abnormal posture, decreased safety awareness, abnormal LLE tone.  Pt reports "vertigo and wooziness" multiple times, describing his feeling with standing, but with further description, he does not c/o room spinning sensation, but unsteadiness. Pt will benefit from skilled PT to address the above stated deficits to decrease fall risk and improve overall improved functional mobility and independence.  OBJECTIVE IMPAIRMENTS Abnormal gait, decreased balance, decreased coordination, decreased mobility, difficulty walking, decreased ROM, decreased strength, decreased safety awareness, dizziness, impaired flexibility, impaired tone, and postural dysfunction.   ACTIVITY LIMITATIONS sitting, standing, stairs, transfers, and locomotion level  PARTICIPATION LIMITATIONS: driving, community activity, and living independently  [place] Time since onset of injury/illness/exacerbation and 3+ comorbidities: see above  are also affecting patient's functional outcome.   REHAB POTENTIAL: Good  CLINICAL DECISION MAKING: Evolving/moderate complexity  EVALUATION COMPLEXITY: Moderate  PLAN: PT FREQUENCY: 2x/week  PT DURATION: 8 weeks, including eval week  PLANNED INTERVENTIONS: Therapeutic exercises, Therapeutic activity, Neuromuscular re-education, Balance training, Gait training, Patient/Family education, Self Care,  Stair training, Vestibular training, [place] repositioning, DME instructions, and Manual therapy  PLAN FOR NEXT SESSION: Review sit<>Stand, work on additions to HEP to address LLE strengthening-gluts, hamstrings, quads; work on weightshifting, weightbearing through LLE with standing, transfers.   Frazier Butt., PT 06/07/2022, 12:20 PM  [facility] at [facility], [place], [place] [address] Phone # [phone] Fax # [phone]

## 2022-06-07 NOTE — Therapy (Signed)
OUTPATIENT OCCUPATIONAL THERAPY NEURO EVALUATION  Patient Name: Barry [name] MRN: [id number] DOB:10-16-1933, 88 y.o., male Today's Date: 06/07/2022  PCP: Susy Frizzle, MD REFERRING PROVIDER: Penni Bombard, MD    OT End of Session - 06/07/22 1106     Visit Number 1    Number of Visits 17    Date for OT Re-Evaluation 08/06/22    Authorization Type Medicare A &B; [employer] (secondary)    OT Start Time 1018    OT Stop Time 1102    OT Time Calculation (min) 44 min    Activity Tolerance Patient tolerated treatment well    Behavior During Therapy [facility] for tasks assessed/performed             Past Medical History:  Diagnosis Date   AAA (abdominal aortic aneurysm) ([place]) 12/17/2007   4.7 cm   Allergy    rhinitis   Anxiety    Cancer ([place])    skin   Cataract    Depression    Diabetes mellitus    Diverticulosis    GERD (gastroesophageal reflux disease)    Hepatitis    Hx of colonic polyps 08/26/2015   Hyperlipidemia    Hypertension    Nephrolithiasis    Personal history of colonic polyps    adenomas, serrated also   PVD (peripheral vascular disease) ([place])    Stroke ([place])    Tobacco abuse    Past Surgical History:  Procedure Laterality Date   COLONOSCOPY  2006, 2008, 2010, 05/27/2011   numerous adenomas - 13 in 2006, 3, 2008, 2 2012 (up to 1 cm), 4 diminutive adenomas, serrated adenomas 2012. Diverticulosis and hemorrhoids also.   EYE SURGERY     EYE SURGERY Left    Kidney stone operation     lower limb amputation, other toe 5th     percutaneous stent graft repair of infrarenal AAA  11/10   5.6 cm (T. early)   surgical excision basal cell carcinoma     TEE WITHOUT CARDIOVERSION N/A 07/29/2021   Procedure: TRANSESOPHAGEAL ECHOCARDIOGRAM (TEE);  Surgeon: Geralynn Rile, MD;  Location: [facility];  Service: Cardiovascular;  Laterality: N/A;   tympanic eardrum repair     VASECTOMY     Patient Active Problem List   Diagnosis Date Noted   Right  thalamic infarction ([place]) 07/31/2021   Stage 3a chronic kidney disease ([place])    Benign essential HTN    Left hemiparesis ([place])    Type 2 diabetes mellitus with hyperglycemia, without long-term current use of insulin ([place])    CVA (cerebral vascular accident) ([place]) 07/27/2021   Frequent PVCs 03/20/2020   Aortic stenosis 03/20/2020   COPD (chronic obstructive pulmonary disease) ([place]) 03/20/2020   Constipation 04/30/2019   Protein-calorie malnutrition ([place]) 04/30/2019   Anxiety with depression 12/05/2018   Seasonal and perennial allergic rhinitis 08/09/2018   Carotid artery disease ([place]) 11/18/2017   Hx of colonic polyps 08/26/2015   Erectile dysfunction 02/17/2015   AAA (abdominal aortic aneurysm) without rupture ([place]) 05/21/2014   Chest pain 01/31/2014   CAD (coronary artery disease) 12/24/2013   HEARING LOSS, CONDUCTIVE, LEFT 02/23/2010   DIZZINESS, CHRONIC 05/28/2008   PULMONARY NODULE 09/07/2007   Diabetes mellitus type II, controlled ([place]) 09/06/2007   Hyperlipidemia 09/06/2007   TOBACCO ABUSE 09/06/2007   Essential hypertension 09/06/2007   Allergic rhinitis 09/06/2007   GERD 09/06/2007   BASAL CELL CARCINOMA, NOSE 09/06/2007    ONSET DATE: 07/27/2021  REFERRING DIAG: M42.683 (ICD-10-CM) -  Cerebrovascular accident (CVA) due to bilateral embolism of posterior cerebral arteries (HCC) G81.94 (ICD-10-CM) - Left hemiparesis   THERAPY DIAG:  Hemiplegia and hemiparesis following cerebral infarction affecting left non-dominant side (HCC)  Other lack of coordination  Muscle weakness (generalized)  Unsteadiness on feet  Other abnormalities of gait and mobility  Rationale for Evaluation and Treatment Rehabilitation  SUBJECTIVE:   SUBJECTIVE STATEMENT: Pt reports stroke back in October 2022, followed by stay in inpatient rehab, SNF, [facility] therapies and now here at OP.  Pt reports that he still has trouble with his L leg impacting his walking.  Pt reports doing some walking in  home with a walker.  Pt reports his standing is limited by dizziness, possibly vertigo.  Pt reports some pain and weakness in L shoulder, however reports having an injury prior to stroke so is unsure if nature of pain/weakness is ortho or neuro in nature.  Pt accompanied by: self and family member (son remained in waiting room)  PERTINENT HISTORY: CAD, COPD, type 2 diabetes, hypertension, GERD, allergic rhinitis, and protein calorie malnutrition   PRECAUTIONS: Fall  WEIGHT BEARING RESTRICTIONS No  PAIN:  Are you having pain? No  FALLS: Has patient fallen in last 6 months? No  LIVING ENVIRONMENT: Lives with: lives with their family and was living independently prior to CVA but is now living with son and his wife Lives in: House/apartment Stairs: Yes: Internal: full flight to go upstairs, he uses a stair lift to get upstairs for shower, but otherwise remains on main floor steps; and External: 2 steps at front and 6 steps at back steps; uses RW to get down steps Has following equipment at home: Gilford Rile - 2 wheeled, Wheelchair (manual), Shower bench, bed side commode, Grab bars, and transport chair  PLOF: Independent, Independent with homemaking with ambulation, and Independent with gait  PATIENT GOALS to be able to walk again  OBJECTIVE:   HAND DOMINANCE: Right  ADLs: Transfers/ambulation related to ADLs: Mod I toilet transfers from w/c, Supervision shower transfers Eating: Mod I Grooming: Mod I UB Dressing: Mod I, difficulty with managing buttons LB Dressing: Mod I from sit > stand level, decreased coordination with tying shoes Toileting: Mod I toilet transfers and toileting hygiene Bathing: Min-mod assist for bathing Tub Shower transfers: Supervision for stand pivot transfer w/c <> shower chair/bench Equipment: Transfer tub bench, Grab bars, Walk in shower, and bed side commode   IADLs: Light housekeeping: family is performing majority of these tasks, but reports that he feels  he could do some of these things Meal Prep: not currently performing, family may leave prepared food for him if they are out Community mobility: was driving prior to stroke but has not returned to driving Medication management: Son is providing assist  MOBILITY STATUS: Needs Assist: Supervision stand pivot transfer  POSTURE COMMENTS:  No Significant postural limitations  ACTIVITY TOLERANCE: Activity tolerance: WNL for tasks assessed during eval  UPPER EXTREMITY ROM     Active ROM Right eval Left eval  Shoulder flexion 120 108  Shoulder abduction    Shoulder adduction    Shoulder extension    Shoulder internal rotation [facility]  Shoulder external rotation [facility]  Elbow flexion [facility] WFL  Elbow extension [facility] WFL  Wrist flexion    Wrist extension    Wrist ulnar deviation    Wrist radial deviation    Wrist pronation    Wrist supination    (Blank rows = not tested)   UPPER EXTREMITY MMT:  MMT Right eval Left eval  Shoulder flexion 5/5 4/5  Shoulder abduction    Shoulder adduction    Shoulder extension    Shoulder internal rotation    Shoulder external rotation    Middle trapezius    Lower trapezius    Elbow flexion  4/5  Elbow extension  4/5  Wrist flexion    Wrist extension    Wrist ulnar deviation    Wrist radial deviation    Wrist pronation    Wrist supination    (Blank rows = not tested)  HAND FUNCTION: Grip strength: Right: 64 lbs; Left: 68 lbs, Lateral pinch: Right: 18 lbs, Left: 16 lbs, and 3 point pinch: Right: 15 lbs, Left: 11 lbs  COORDINATION: Finger Nose Finger test: mild to moderate dysmetria on L 9 Hole Peg test: Right: 36.13 sec; Left: 51.91 sec Box and Blocks:  Right 43 blocks, Left 34 blocks Rapid hand alternating movements: pt with decreased speed and coordination on L  SENSATION: Light touch: WFL Pt initially reports "different sensation" but upon further assessment, pt sensation seems intact bilaterally  COGNITION: Overall  cognitive status:  reports some loss of memory of instances from before the stroke, reports difficulty with dates on important information from prior to stroke.  Reports no issues with STM or current recall of information.  Will need to be further assessed during next session and during functional tasks  VISION: Subjective report: no changes Baseline vision: Wears glasses for reading only  VISION ASSESSMENT: To be further assessed in functional context  Patient has difficulty with following activities due to following visual impairments: IP rehab noted some L inattention, pt does not report any during eval.  Will need to be further assessed in functional context  TODAY'S TREATMENT:  N/A   PATIENT EDUCATION: Education details: Educated on role and purpose of OT as well as potential interventions and goals for therapy based on initial evaluation findings. Person educated: Patient Education method: Explanation Education comprehension: verbalized understanding and needs further education   HOME EXERCISE PROGRAM: TBD    GOALS: Goals reviewed with patient? No  SHORT TERM GOALS: Target date: 07/09/2022  Pt will be independent with FMC/GMC HEP to increase independence with ADLs and IADLs. Baseline: Goal status: INITIAL  2.  Pt will verbalize understanding of task modifications, adaptive strategies, and/or potential AE needs to increase ease, safety, and independence w/ ADLs and IADLs. Baseline:  Goal status: INITIAL  3.  Pt will demonstrate improved fine motor coordination for ADLs (buttons, tying shoes) as evidenced by decreasing 9 hole peg test score for LUE by 5 secs Baseline: 9 Hole Peg test: Right: 36.13 sec; Left: 51.91 sec Goal status: INITIAL   LONG TERM GOALS: Target date: 08/06/2022  Pt will perform dynamic standing task for simple IADLs w/o LOB using DME and/or countertop support prn. Baseline:  Goal status: INITIAL  2.  Pt will demonstrate improved fine motor  coordination for ADLs as evidenced by decreasing 9 hole peg test score for LUE by 8 secs Baseline: 9 Hole Peg test: Right: 36.13 sec; Left: 51.91 sec Goal status: INITIAL  3.  Pt will demonstrate improved UE functional use for ADLs as evidenced by increasing box/ blocks score by 4 blocks with LUE Baseline: Box and Blocks:  Right 43 blocks, Left 34 blocks Goal status: INITIAL  4.  Pt will demonstrate ability to safely complete functional bilateral IADL task (e.g., folding clothes, making a sandwich, microwave use, making the bed) w/ Mod I Baseline:  Goal status: INITIAL  5. Pt will demonstrate awareness of return to driving recommendations.  Baseline:  Goal status: INITIAL   ASSESSMENT:  CLINICAL IMPRESSION: Patient is a 88 y.o. male who was seen today for occupational therapy evaluation for impairments s/p CVA impacting balance, LUE coordination, functional mobility and ability to live alone. Pt currently lives with adult son and daughter in law in a 2 story home, however prior to stroke lived alone and was independent with all ADLs, IADLs, and driving. PMHx includes CAD, COPD, type 2 diabetes, hypertension, GERD, allergic rhinitis, and protein calorie malnutrition. Pt will benefit from skilled occupational therapy services to address strength and coordination, ROM, pain management, balance, GM/FM control, cognition, safety awareness, introduction of compensatory strategies/AE prn,  and implementation of an HEP to improve participation and safety during ADLs and IADLs.   PERFORMANCE DEFICITS in functional skills including ADLs, IADLs, coordination, dexterity, ROM, strength, pain, FMC, GMC, mobility, balance, body mechanics, endurance, decreased knowledge of precautions, decreased knowledge of use of DME, and UE functional use, cognitive skills including memory, and psychosocial skills including environmental adaptation.   IMPAIRMENTS are limiting patient from ADLs and IADLs.   COMORBIDITIES  may have co-morbidities  that affects occupational performance. Patient will benefit from skilled OT to address above impairments and improve overall function.  MODIFICATION OR ASSISTANCE TO COMPLETE EVALUATION: Min-Moderate modification of tasks or assist with assess necessary to complete an evaluation.  OT OCCUPATIONAL PROFILE AND HISTORY: Detailed assessment: Review of records and additional review of physical, cognitive, psychosocial history related to current functional performance.  CLINICAL DECISION MAKING: LOW - limited treatment options, no task modification necessary  REHAB POTENTIAL: Good  EVALUATION COMPLEXITY: Low    PLAN: OT FREQUENCY: 1-2x/week  OT DURATION: 8 weeks  PLANNED INTERVENTIONS: self care/ADL training, therapeutic exercise, therapeutic activity, neuromuscular re-education, passive range of motion, balance training, functional mobility training, electrical stimulation, ultrasound, moist heat, cryotherapy, patient/family education, cognitive remediation/compensation, energy conservation, coping strategies training, and DME and/or AE instructions  RECOMMENDED OTHER SERVICES: NA  CONSULTED AND AGREED WITH PLAN OF CARE: Patient and family member/caregiver  PLAN FOR NEXT SESSION: Initiate coordination HEP, engage in BUE functional tasks, transfers and standing balance   Tish Begin, [place], OTR/L 06/07/2022, 11:20 AM

## 2022-06-08 DIAGNOSIS — E0840 Diabetes mellitus due to underlying condition with diabetic neuropathy, unspecified: Secondary | ICD-10-CM | POA: Diagnosis not present

## 2022-06-08 DIAGNOSIS — M79674 Pain in right toe(s): Secondary | ICD-10-CM | POA: Diagnosis not present

## 2022-06-08 DIAGNOSIS — M21372 Foot drop, left foot: Secondary | ICD-10-CM | POA: Diagnosis not present

## 2022-06-08 DIAGNOSIS — M2041 Other hammer toe(s) (acquired), right foot: Secondary | ICD-10-CM | POA: Diagnosis not present

## 2022-06-08 DIAGNOSIS — B351 Tinea unguium: Secondary | ICD-10-CM | POA: Diagnosis not present

## 2022-06-10 ENCOUNTER — Encounter: Payer: Self-pay | Admitting: Physical Therapy

## 2022-06-10 ENCOUNTER — Ambulatory Visit: Payer: Medicare Other | Admitting: Physical Therapy

## 2022-06-10 DIAGNOSIS — R2681 Unsteadiness on feet: Secondary | ICD-10-CM | POA: Diagnosis not present

## 2022-06-10 DIAGNOSIS — I69354 Hemiplegia and hemiparesis following cerebral infarction affecting left non-dominant side: Secondary | ICD-10-CM | POA: Diagnosis not present

## 2022-06-10 DIAGNOSIS — M6281 Muscle weakness (generalized): Secondary | ICD-10-CM

## 2022-06-10 DIAGNOSIS — R278 Other lack of coordination: Secondary | ICD-10-CM | POA: Diagnosis not present

## 2022-06-10 DIAGNOSIS — R293 Abnormal posture: Secondary | ICD-10-CM | POA: Diagnosis not present

## 2022-06-10 DIAGNOSIS — R2689 Other abnormalities of gait and mobility: Secondary | ICD-10-CM | POA: Diagnosis not present

## 2022-06-10 NOTE — Therapy (Signed)
OUTPATIENT PHYSICAL THERAPY TREATMENT NOTE   Patient Name: Barry [name] MRN: [id number] DOB:Mar 03, 1933, 88 y.o., male Today's Date: 06/11/2022  PCP:  Jenna Luo, MD REFERRING PROVIDER: Penni Bombard, MD   END OF SESSION:   PT End of Session - 06/10/22 1450     Visit Number 2    Number of Visits 16    Date for PT Re-Evaluation 07/30/22    Authorization Type Medicare/[employer]    Progress Note Due on Visit 10    PT Start Time 1449    PT Stop Time 4193    PT Time Calculation (min) 41 min    Equipment Utilized During Treatment Gait belt    Activity Tolerance Patient tolerated treatment well    Behavior During Therapy [facility] for tasks assessed/performed             Past Medical History:  Diagnosis Date   AAA (abdominal aortic aneurysm) ([place]) 12/17/2007   4.7 cm   Allergy    rhinitis   Anxiety    Cancer ([place])    skin   Cataract    Depression    Diabetes mellitus    Diverticulosis    GERD (gastroesophageal reflux disease)    Hepatitis    Hx of colonic polyps 08/26/2015   Hyperlipidemia    Hypertension    Nephrolithiasis    Personal history of colonic polyps    adenomas, serrated also   PVD (peripheral vascular disease) ([employer])    Stroke ([employer])    Tobacco abuse    Past Surgical History:  Procedure Laterality Date   COLONOSCOPY  2006, 2008, 2010, 05/27/2011   numerous adenomas - 13 in 2006, 3, 2008, 2 2012 (up to 1 cm), 4 diminutive adenomas, serrated adenomas 2012. Diverticulosis and hemorrhoids also.   EYE SURGERY     EYE SURGERY Left    Kidney stone operation     lower limb amputation, other toe 5th     percutaneous stent graft repair of infrarenal AAA  11/10   5.6 cm (T. early)   surgical excision basal cell carcinoma     TEE WITHOUT CARDIOVERSION N/A 07/29/2021   Procedure: TRANSESOPHAGEAL ECHOCARDIOGRAM (TEE);  Surgeon: Geralynn Rile, MD;  Location: [facility];  Service: Cardiovascular;  Laterality: N/A;   tympanic eardrum repair      VASECTOMY     Patient Active Problem List   Diagnosis Date Noted   Right thalamic infarction ([place]) 07/31/2021   Stage 3a chronic kidney disease ([place])    Benign essential HTN    Left hemiparesis ([place])    Type 2 diabetes mellitus with hyperglycemia, without long-term current use of insulin ([place])    CVA (cerebral vascular accident) ([place]) 07/27/2021   Frequent PVCs 03/20/2020   Aortic stenosis 03/20/2020   COPD (chronic obstructive pulmonary disease) ([place]) 03/20/2020   Constipation 04/30/2019   Protein-calorie malnutrition ([place]) 04/30/2019   Anxiety with depression 12/05/2018   Seasonal and perennial allergic rhinitis 08/09/2018   Carotid artery disease ([place]) 11/18/2017   Hx of colonic polyps 08/26/2015   Erectile dysfunction 02/17/2015   AAA (abdominal aortic aneurysm) without rupture ([place]) 05/21/2014   Chest pain 01/31/2014   CAD (coronary artery disease) 12/24/2013   HEARING LOSS, CONDUCTIVE, LEFT 02/23/2010   DIZZINESS, CHRONIC 05/28/2008   PULMONARY NODULE 09/07/2007   Diabetes mellitus type II, controlled ([place]) 09/06/2007   Hyperlipidemia 09/06/2007   TOBACCO ABUSE 09/06/2007   Essential hypertension 09/06/2007   Allergic rhinitis 09/06/2007   GERD 09/06/2007  BASAL CELL CARCINOMA, NOSE 09/06/2007    REFERRING DIAG:  G62.694 (ICD-10-CM) - Cerebrovascular accident (CVA) due to bilateral embolism of posterior cerebral arteries (HCC)  G81.94 (ICD-10-CM) - Left hemiparesis (HCC)    THERAPY DIAG:  Other abnormalities of gait and mobility  Muscle weakness (generalized)  Unsteadiness on feet  Rationale for Evaluation and Treatment Rehabilitation  PERTINENT HISTORY: PMH:  AAA, anxiety, depression, skin cancer, DM, GERD, HLD, HTN, CVA, CAD, COPD  PRECAUTIONS:  Fall  SUBJECTIVE: No changes, nothing new.  Did a little bit of standing at home, maybe 20-30 sec at a time  PAIN:  Are you having pain? No   OBJECTIVE:    TODAY'S TREATMENT: 06/10/2022 Activity  Comments  Sit<>stand x 5 reps Cues each rep for correct  hand placement  Minisquats to elevated mat surface x 10 reps Cues for technique  Seated therex: March, 2 x 10, 2# weight LAQ, 2 x 10, 2# weight Seated step out and in, over 1" obstacle, for hip abduction 2 x 10  Seated hip adduction ball squeeze 2 x 10  REsisted hamstring curls, green theraband, 2 x 10 Ankle dorsiflexion, green band, 2 x 10 L ankle eversion, resisted green band x 5 reps, no resistance with pillowcase, x 10 reps for A/ROM Resisted green theraband hip abduction, 10 reps Fatigue on LLE after 6-7 reps and heel gets caught on stepping out/in over obstacle      Cues for eccentric control        With additional manual resistance for eccentric control  Gait 45 ft, RW, min assist and cues to place LLE in more abducted position to lessen scissoring            Access Code: [email or web address] URL: [email or web address] Date: 06/10/2022 Prepared by: [facility]  Exercises - Sit to Stand with Armchair  - 1-2 x daily - 7 x weekly - 1 sets - 5 reps - 3 sec hold - Seated Hip Abduction with Resistance  - 1 x daily - 5 x weekly - 2 sets - 10 reps - Seated Ankle Dorsiflexion with Resistance  - 1 x daily - 5 x weekly - 2 sets - 10 reps  PATIENT EDUCATION: Education details: HEP additions Person educated: Patient Education method: Explanation, Demonstration, and Handouts Education comprehension: verbalized understanding, returned demonstration, and needs further education  ---------------------------------------------------------------------------------- (objective measures completed at initial evaluation unless otherwise dated)   DIAGNOSTIC FINDINGS: Presented 07/27/2021 to [facility] with acute onset of left-sided weakness.  CT/MRI showed acute right posterior lentiform nucleus, thalamus, anterior caudate infarct with additional punctate infarct in the lateral left  thalamus and possible the left lentiform.  CT angiogram head and neck with mild atherosclerotic plaque of the bilateral carotid bulbs resulting in 30 to 40% stenosis on the right.  1 cm enhancing lesion in the right parotid gland indeterminate recommend ENT follow-up as outpatient.  MRA of the head showed no significant proximal stenosis aneurysm or occlusion.  Echocardiogram with ejection fraction of 60 to 85% grade 1 diastolic dysfunction.  TEE showed no left atrial appendage or thrombus detected.  No PFO.  (Per 07/2021 notes)   COGNITION: Overall cognitive status: Within functional limits for tasks assessed             SENSATION: Light touch: WFL Proprioception: WFL   COORDINATION: Decreased coordination, slowed movement pattern, LLE     MUSCLE TONE: LLE: Moderate     POSTURE: rounded shoulders and forward  head   LOWER EXTREMITY ROM:      Active  Right Eval Left Eval  Hip flexion      Hip extension      Hip abduction      Hip adduction      Hip internal rotation      Hip external rotation      Knee flexion      Knee extension   Decreased full AROM (-24 degrees); passive -5 degrees  Ankle dorsiflexion      Ankle plantarflexion      Ankle inversion      Ankle eversion       (Blank rows = not tested)   LOWER EXTREMITY MMT:     MMT Right Eval Left Eval  Hip flexion 5 4  Hip extension      Hip abduction 5 4  Hip adduction 5 4  Hip internal rotation      Hip external rotation      Knee flexion 5 3+  Knee extension 5 3-  Ankle dorsiflexion 4 3+  Ankle plantarflexion      Ankle inversion   3+  Ankle eversion   3+  (Blank rows = not tested)     TRANSFERS: Assistive device utilized: Environmental consultant - 2 wheeled  Sit to stand: Min A Stand to sit: Min A LUE at walker, RUE pushes up from transport chair.  At times, he attempts to push up with BUEs on walker; he sits, keeping BUEs at walker.  Cues provided for correct hand placement for improved sit<>stand technique    GAIT: Gait pattern: step through pattern, decreased step length- Left, decreased ankle dorsiflexion- Left, knee flexed in stance- Right, knee flexed in stance- Left, scissoring, narrow BOS, and poor foot clearance- Left Distance walked: 40 ft Assistive device utilized: Environmental consultant - 2 wheeled Level of assistance: Min A Comments: 32.94 sec in 10 meter walk:  0.99 ft/sec   FUNCTIONAL TESTs:  5 times sit to stand: 27.97 sec with BUE support Timed up and go (TUG): 39.12 sec with RW Berg Balance Scale: 14/56 (Scores <45/56 indicate increased fall risk   PATIENT SURVEYS:  FOTO NA due to CVA >6 months ago   TODAY'S TREATMENT:  Sit<>stand from elevated mat height, simulating bed, x 5 reps, with cues for hand placement, foot placement; cues upon standing for glut/quad activation, upright posture.  HEP initiated-see below     PATIENT EDUCATION: Education details: Eval results, POC; initiated HEP-hand placement, safety awareness with sit<>Stand transfers Person educated: Patient Education method: Explanation, Demonstration, Tactile cues, Verbal cues, and Handouts Education comprehension: verbalized understanding, returned demonstration, verbal cues required, and needs further education     HOME EXERCISE PROGRAM: Access Code: [email or web address] URL: [email or web address] Date: 06/07/2022 Prepared by: [facility]   Exercises - Sit to Stand with Armchair  - 1-2 x daily - 7 x weekly - 1 sets - 5 reps - 3 sec hold   -------------------------------------------------------------------------------------------    GOALS: Goals reviewed with patient? Yes   SHORT TERM GOALS: Target date: 07/02/2022   Pt will be supervision with HEP for improved strength, balance, gait. Baseline: Goal status: INITIAL   2.  Pt will improve 5x sit<>stand to less than or equal to 20 sec to demonstrate improved functional strength and transfer efficiency.  Baseline: 27.97 sec  with definite UE support Goal status: INITIAL   3.  Pt will improve Berg score to at least 22/56 to decrease fall risk.  Baseline: 14/56 Goal status: INITIAL     LONG TERM GOALS: Target date: 07/30/2022   Pt will be independent with HEP for improved strength, balance, transfers, and gait. Baseline:  Goal status: INITIAL   2.  Pt will improve 5x sit<>stand to less than or equal to 15 sec to demonstrate improved functional strength and transfer efficiency. Baseline: 27.97 sec BUE support at eval Goal status: INITIAL   3.  Pt will improve TUG score to less than or equal to 20 sec for decreased fall risk. Baseline: 39.12 sec with RW Goal status: INITIAL   4.  Pt will improve gait velocity to at least 1.8 ft/sec for improved gait efficiency and safety. Baseline: 0.99 ft/sec Goal status: INITIAL   5.  Pt will improve Berg score to at least 32/56 to decrease fall risk. Baseline: 14/56 Goal status: INITIAL   ASSESSMENT:   CLINICAL IMPRESSION: Skilled PT session today focused on lower extremity strengthening and reinforcement of LLE weightbearing, proper transfer technique.  Pt needs cues each rep for hand placement, as pt uses bilat hands on walker to try to stand.  With hip abduction and ankle dorsiflexion/eversion exercises LLE, pt demonstrates fatigue around 6-7 reps, and is able to resume after brief rest break.  He will continue to benefit from skilled PT to address strength, balance, gait for improved overall functional mobility and decreased fall risk.   OBJECTIVE IMPAIRMENTS Abnormal gait, decreased balance, decreased coordination, decreased mobility, difficulty walking, decreased ROM, decreased strength, decreased safety awareness, dizziness, impaired flexibility, impaired tone, and postural dysfunction.    ACTIVITY LIMITATIONS sitting, standing, stairs, transfers, and locomotion level   PARTICIPATION LIMITATIONS: driving, community activity, and living independently    [place] Time since onset of injury/illness/exacerbation and 3+ comorbidities: see above  are also affecting patient's functional outcome.    REHAB POTENTIAL: Good   CLINICAL DECISION MAKING: Evolving/moderate complexity   EVALUATION COMPLEXITY: Moderate   PLAN: PT FREQUENCY: 2x/week   PT DURATION: 8 weeks, including eval week   PLANNED INTERVENTIONS: Therapeutic exercises, Therapeutic activity, Neuromuscular re-education, Balance training, Gait training, Patient/Family education, Self Care, Stair training, Vestibular training, [place] repositioning, DME instructions, and Manual therapy   PLAN FOR NEXT SESSION: Continue sit<>Stand and addition of LLE tucked posteriorly for more weightbearing; review additions to HEP to address LLE strengthening-continue work on gluts, hamstrings, quads; work on [place], weightbearing through LLE with standing, transfers.  Frazier Butt., PT 06/11/2022, 9:20 AM  [facility] at [facility] [place], [place], [place] [address] Phone # [phone] Fax # [phone]

## 2022-06-14 ENCOUNTER — Ambulatory Visit: Payer: Medicare Other | Admitting: Occupational Therapy

## 2022-06-14 ENCOUNTER — Ambulatory Visit: Payer: Medicare Other

## 2022-06-14 DIAGNOSIS — R2681 Unsteadiness on feet: Secondary | ICD-10-CM

## 2022-06-14 DIAGNOSIS — I69354 Hemiplegia and hemiparesis following cerebral infarction affecting left non-dominant side: Secondary | ICD-10-CM

## 2022-06-14 DIAGNOSIS — M6281 Muscle weakness (generalized): Secondary | ICD-10-CM | POA: Diagnosis not present

## 2022-06-14 DIAGNOSIS — R2689 Other abnormalities of gait and mobility: Secondary | ICD-10-CM

## 2022-06-14 DIAGNOSIS — R293 Abnormal posture: Secondary | ICD-10-CM | POA: Diagnosis not present

## 2022-06-14 DIAGNOSIS — R278 Other lack of coordination: Secondary | ICD-10-CM

## 2022-06-14 NOTE — Therapy (Signed)
OUTPATIENT OCCUPATIONAL THERAPY NEURO  Treatment Session  Patient Name: ELKIN BELFIELD MRN: [id number] DOB:[birth year], 88 y.o., male Today's Date: 06/14/2022  PCP: Susy Frizzle, MD REFERRING PROVIDER: Penni Bombard, MD    OT End of Session - 06/14/22 1151     Visit Number 2    Number of Visits 17    Date for OT Re-Evaluation 08/06/22    Authorization Type Medicare A &B; [employer] (secondary)    OT Start Time 1149    OT Stop Time 1230    OT Time Calculation (min) 41 min    Activity Tolerance Patient tolerated treatment well    Behavior During Therapy [facility] for tasks assessed/performed              Past Medical History:  Diagnosis Date   AAA (abdominal aortic aneurysm) ([place]) 12/17/2007   4.7 cm   Allergy    rhinitis   Anxiety    Cancer ([place])    skin   Cataract    Depression    Diabetes mellitus    Diverticulosis    GERD (gastroesophageal reflux disease)    Hepatitis    Hx of colonic polyps 08/26/2015   Hyperlipidemia    Hypertension    Nephrolithiasis    Personal history of colonic polyps    adenomas, serrated also   PVD (peripheral vascular disease) ([place])    Stroke ([place])    Tobacco abuse    Past Surgical History:  Procedure Laterality Date   COLONOSCOPY  2006, 2008, 2010, 05/27/2011   numerous adenomas - 13 in 2006, 3, 2008, 2 2012 (up to 1 cm), 4 diminutive adenomas, serrated adenomas 2012. Diverticulosis and hemorrhoids also.   EYE SURGERY     EYE SURGERY Left    Kidney stone operation     lower limb amputation, other toe 5th     percutaneous stent graft repair of infrarenal AAA  11/10   5.6 cm (T. early)   surgical excision basal cell carcinoma     TEE WITHOUT CARDIOVERSION N/A 07/29/2021   Procedure: TRANSESOPHAGEAL ECHOCARDIOGRAM (TEE);  Surgeon: Geralynn Rile, MD;  Location: [facility];  Service: Cardiovascular;  Laterality: N/A;   tympanic eardrum repair     VASECTOMY     Patient Active Problem List   Diagnosis Date  Noted   Right thalamic infarction ([place]) 07/31/2021   Stage 3a chronic kidney disease ([place])    Benign essential HTN    Left hemiparesis ([place])    Type 2 diabetes mellitus with hyperglycemia, without long-term current use of insulin ([place])    CVA (cerebral vascular accident) ([place]) 07/27/2021   Frequent PVCs 03/20/2020   Aortic stenosis 03/20/2020   COPD (chronic obstructive pulmonary disease) ([place]) 03/20/2020   Constipation 04/30/2019   Protein-calorie malnutrition ([place]) 04/30/2019   Anxiety with depression 12/05/2018   Seasonal and perennial allergic rhinitis 08/09/2018   Carotid artery disease ([place]) 11/18/2017   Hx of colonic polyps 08/26/2015   Erectile dysfunction 02/17/2015   AAA (abdominal aortic aneurysm) without rupture ([place]) 05/21/2014   Chest pain 01/31/2014   CAD (coronary artery disease) 12/24/2013   HEARING LOSS, CONDUCTIVE, LEFT 02/23/2010   DIZZINESS, CHRONIC 05/28/2008   PULMONARY NODULE 09/07/2007   Diabetes mellitus type II, controlled ([place]) 09/06/2007   Hyperlipidemia 09/06/2007   TOBACCO ABUSE 09/06/2007   Essential hypertension 09/06/2007   Allergic rhinitis 09/06/2007   GERD 09/06/2007   BASAL CELL CARCINOMA, NOSE 09/06/2007    ONSET DATE: 07/27/2021  REFERRING DIAG:  I63.433 (ICD-10-CM) - Cerebrovascular accident (CVA) due to bilateral embolism of posterior cerebral arteries (HCC) G81.94 (ICD-10-CM) - Left hemiparesis   THERAPY DIAG:  Hemiplegia and hemiparesis following cerebral infarction affecting left non-dominant side (HCC)  Other lack of coordination  Muscle weakness (generalized)  Unsteadiness on feet  Other abnormalities of gait and mobility  Rationale for Evaluation and Treatment Rehabilitation  SUBJECTIVE:   SUBJECTIVE STATEMENT: Pt having questions about his pain and weakness in L shoulder and whether it is due to an injury he had prior to stroke or if it is stroke impairment.  Pt accompanied by: self and family member (son remained  in waiting room)  PERTINENT HISTORY: CAD, COPD, type 2 diabetes, hypertension, GERD, allergic rhinitis, and protein calorie malnutrition   PRECAUTIONS: Fall  WEIGHT BEARING RESTRICTIONS No  PAIN:  Are you having pain? No  FALLS: Has patient fallen in last 6 months? No  LIVING ENVIRONMENT: Lives with: lives with their family and was living independently prior to CVA but is now living with son and his wife Lives in: House/apartment Stairs: Yes: Internal: full flight to go upstairs, he uses a stair lift to get upstairs for shower, but otherwise remains on main floor steps; and External: 2 steps at front and 6 steps at back steps; uses RW to get down steps Has following equipment at home: Gilford Rile - 2 wheeled, Wheelchair (manual), Shower bench, bed side commode, Grab bars, and transport chair  PLOF: Independent, Independent with homemaking with ambulation, and Independent with gait  PATIENT GOALS to be able to walk again  OBJECTIVE:   HAND DOMINANCE: Right  ADLs: Transfers/ambulation related to ADLs: Mod I toilet transfers from w/c, Supervision shower transfers UB Dressing: Mod I, difficulty with managing buttons LB Dressing: Mod I from sit > stand level, decreased coordination with tying shoes Bathing: Min-mod assist for bathing Tub Shower transfers: Supervision for stand pivot transfer w/c <> shower chair/bench Equipment: Transfer tub bench, Grab bars, Walk in shower, and bed side commode   IADLs: Light housekeeping: family is performing majority of these tasks, but reports that he feels he could do some of these things Meal Prep: not currently performing, family may leave prepared food for him if they are out Community mobility: was driving prior to stroke but has not returned to driving Medication management: Son is providing assist   UPPER EXTREMITY ROM     Active ROM Right eval Left eval  Shoulder flexion 120 108  Shoulder abduction    Shoulder adduction    Shoulder  extension    Shoulder internal rotation [facility]  Shoulder external rotation [facility]  Elbow flexion [facility] WFL  Elbow extension [facility] WFL  Wrist flexion    Wrist extension    Wrist ulnar deviation    Wrist radial deviation    Wrist pronation    Wrist supination    (Blank rows = not tested)   UPPER EXTREMITY MMT:     MMT Right eval Left eval  Shoulder flexion 5/5 4/5  Shoulder abduction    Shoulder adduction    Shoulder extension    Shoulder internal rotation    Shoulder external rotation    Middle trapezius    Lower trapezius    Elbow flexion  4/5  Elbow extension  4/5  Wrist flexion    Wrist extension    Wrist ulnar deviation    Wrist radial deviation    Wrist pronation    Wrist supination    (Blank rows =  not tested)  COORDINATION: Finger Nose Finger test: mild to moderate dysmetria on L 9 Hole Peg test: Right: 36.13 sec; Left: 51.91 sec Box and Blocks:  Right 43 blocks, Left 34 blocks Rapid hand alternating movements: pt with decreased speed and coordination on L  COGNITION: Overall cognitive status:  reports some loss of memory of instances from before the stroke, reports difficulty with dates on important information from prior to stroke.  Reports no issues with STM or current recall of information.  Will need to be further assessed during next session and during functional tasks  VISION: Subjective report: no changes Baseline vision: Wears glasses for reading only  VISION ASSESSMENT: To be further assessed in functional context  Patient has difficulty with following activities due to following visual impairments: IP rehab noted some L inattention, pt does not report any during eval.  Will need to be further assessed in functional context  TODAY'S TREATMENT:  Coordination:  large grip peg board with pt able to place and remove pegs with minimal increased effort and dropping 10% of pegs when placing and removing.  Pt demonstrating mild impairments in  coordination, but no reports of limited shoulder mobility during large peg activity OT educated on various [place] activities on [facility] HEP with focus on picking up variety of small items; placing items on table, in cup, and stacking flat items; in-hand manipulation and translation of coins from palm of hand to place in container and then into coin slot to increase facilitation of translation and precision grip. Placing clothespins on container for coordination and pinch and stringing beads. Educated on BUE tasks with stringing beads, folding laundry, washing dishes, and screwing/unscrewing nuts and bolts.  Pt able to string beads with min increased effort and dropping 1/10.    PATIENT EDUCATION: Education details: Educated on fine motor control tasks and provided with HEP. Person educated: Patient Education method: Explanation, Verbal cues, and Handouts Education comprehension: verbalized understanding and needs further education   HOME EXERCISE PROGRAM: Fine motor control HEP    GOALS: Goals reviewed with patient? Yes  SHORT TERM GOALS: Target date: 07/09/2022  Pt will be independent with FMC/GMC HEP to increase independence with ADLs and IADLs. Baseline: Goal status: IN PROGRESS  2.  Pt will verbalize understanding of task modifications, adaptive strategies, and/or potential AE needs to increase ease, safety, and independence w/ ADLs and IADLs. Baseline:  Goal status: IN PROGRESS  3.  Pt will demonstrate improved fine motor coordination for ADLs (buttons, tying shoes) as evidenced by decreasing 9 hole peg test score for LUE by 5 secs Baseline: 9 Hole Peg test: Right: 36.13 sec; Left: 51.91 sec Goal status: IN PROGRESS   LONG TERM GOALS: Target date: 08/06/2022  Pt will perform dynamic standing task for simple IADLs w/o LOB using DME and/or countertop support prn. Baseline:  Goal status: IN PROGRESS  2.  Pt will demonstrate improved fine motor coordination for ADLs as evidenced by  decreasing 9 hole peg test score for LUE by 8 secs Baseline: 9 Hole Peg test: Right: 36.13 sec; Left: 51.91 sec Goal status: IN PROGRESS  3.  Pt will demonstrate improved UE functional use for ADLs as evidenced by increasing box/ blocks score by 4 blocks with LUE Baseline: Box and Blocks:  Right 43 blocks, Left 34 blocks Goal status: IN PROGRESS  4.  Pt will demonstrate ability to safely complete functional bilateral IADL task (e.g., folding clothes, making a sandwich, microwave use, making the bed) w/ Mod I Baseline:  Goal status: IN PROGRESS  5. Pt will demonstrate awareness of return to driving recommendations.  Baseline:  Goal status: IN PROGRESS   ASSESSMENT:  CLINICAL IMPRESSION: Pt seen for first treatment session s/p initial evaluation.  Therapist reviewed goals with pt, pt in agreement. Pt continues to verbalize questions about pain and limited ROM in L shoulder and whether it is ortho or neuro in nature.  Therapist explaining typical neuro presentation and answering questions within OT scope.  Pt able to engage in [facility] with min impairments notes, dropping 10% of items and with no reported increased pain or limitation when incorporating overhead reach with small items.    PERFORMANCE DEFICITS in functional skills including ADLs, IADLs, coordination, dexterity, ROM, strength, pain, FMC, GMC, mobility, balance, body mechanics, endurance, decreased knowledge of precautions, decreased knowledge of use of DME, and UE functional use, cognitive skills including memory, and psychosocial skills including environmental adaptation.   IMPAIRMENTS are limiting patient from ADLs and IADLs.   COMORBIDITIES may have co-morbidities  that affects occupational performance. Patient will benefit from skilled OT to address above impairments and improve overall function.  MODIFICATION OR ASSISTANCE TO COMPLETE EVALUATION: Min-Moderate modification of tasks or assist with assess necessary to complete an  evaluation.  OT OCCUPATIONAL PROFILE AND HISTORY: Detailed assessment: Review of records and additional review of physical, cognitive, psychosocial history related to current functional performance.  CLINICAL DECISION MAKING: LOW - limited treatment options, no task modification necessary  REHAB POTENTIAL: Good  EVALUATION COMPLEXITY: Low    PLAN: OT FREQUENCY: 1-2x/week  OT DURATION: 8 weeks  PLANNED INTERVENTIONS: self care/ADL training, therapeutic exercise, therapeutic activity, neuromuscular re-education, passive range of motion, balance training, functional mobility training, electrical stimulation, ultrasound, moist heat, cryotherapy, patient/family education, cognitive remediation/compensation, energy conservation, coping strategies training, and DME and/or AE instructions  RECOMMENDED OTHER SERVICES: NA  CONSULTED AND AGREED WITH PLAN OF CARE: Patient and family member/caregiver  PLAN FOR NEXT SESSION: Review coordination HEP, engage in BUE functional tasks, transfers, functional reach and standing balance   Travaughn Vue, [place], OTR/L 06/14/2022, 12:21 PM

## 2022-06-14 NOTE — Therapy (Signed)
OUTPATIENT PHYSICAL THERAPY TREATMENT NOTE   Patient Name: Barry [name] MRN: [id number] DOB:[birth year], 88 y.o., male Today's Date: 06/14/2022  PCP:  Jenna Luo, MD REFERRING PROVIDER: Penni Bombard, MD   END OF SESSION:   PT End of Session - 06/14/22 1104     Visit Number 3    Number of Visits 16    Date for PT Re-Evaluation 07/30/22    Authorization Type Medicare/[employer]    Progress Note Due on Visit 10    PT Start Time 1100    PT Stop Time 1145    PT Time Calculation (min) 45 min    Equipment Utilized During Treatment Gait belt    Activity Tolerance Patient tolerated treatment well    Behavior During Therapy [facility] for tasks assessed/performed             Past Medical History:  Diagnosis Date   AAA (abdominal aortic aneurysm) ([place]) 12/17/2007   4.7 cm   Allergy    rhinitis   Anxiety    Cancer ([place])    skin   Cataract    Depression    Diabetes mellitus    Diverticulosis    GERD (gastroesophageal reflux disease)    Hepatitis    Hx of colonic polyps 08/26/2015   Hyperlipidemia    Hypertension    Nephrolithiasis    Personal history of colonic polyps    adenomas, serrated also   PVD (peripheral vascular disease) ([place])    Stroke ([employer])    Tobacco abuse    Past Surgical History:  Procedure Laterality Date   COLONOSCOPY  2006, 2008, 2010, 05/27/2011   numerous adenomas - 13 in 2006, 3, 2008, 2 2012 (up to 1 cm), 4 diminutive adenomas, serrated adenomas 2012. Diverticulosis and hemorrhoids also.   EYE SURGERY     EYE SURGERY Left    Kidney stone operation     lower limb amputation, other toe 5th     percutaneous stent graft repair of infrarenal AAA  11/10   5.6 cm (T. early)   surgical excision basal cell carcinoma     TEE WITHOUT CARDIOVERSION N/A 07/29/2021   Procedure: TRANSESOPHAGEAL ECHOCARDIOGRAM (TEE);  Surgeon: Geralynn Rile, MD;  Location: [facility];  Service: Cardiovascular;  Laterality: N/A;   tympanic eardrum repair      VASECTOMY     Patient Active Problem List   Diagnosis Date Noted   Right thalamic infarction ([place]) 07/31/2021   Stage 3a chronic kidney disease ([place])    Benign essential HTN    Left hemiparesis ([place])    Type 2 diabetes mellitus with hyperglycemia, without long-term current use of insulin ([place])    CVA (cerebral vascular accident) ([place]) 07/27/2021   Frequent PVCs 03/20/2020   Aortic stenosis 03/20/2020   COPD (chronic obstructive pulmonary disease) ([place]) 03/20/2020   Constipation 04/30/2019   Protein-calorie malnutrition ([place]) 04/30/2019   Anxiety with depression 12/05/2018   Seasonal and perennial allergic rhinitis 08/09/2018   Carotid artery disease ([place]) 11/18/2017   Hx of colonic polyps 08/26/2015   Erectile dysfunction 02/17/2015   AAA (abdominal aortic aneurysm) without rupture ([place]) 05/21/2014   Chest pain 01/31/2014   CAD (coronary artery disease) 12/24/2013   HEARING LOSS, CONDUCTIVE, LEFT 02/23/2010   DIZZINESS, CHRONIC 05/28/2008   PULMONARY NODULE 09/07/2007   Diabetes mellitus type II, controlled ([place]) 09/06/2007   Hyperlipidemia 09/06/2007   TOBACCO ABUSE 09/06/2007   Essential hypertension 09/06/2007   Allergic rhinitis 09/06/2007   GERD 09/06/2007  BASAL CELL CARCINOMA, NOSE 09/06/2007    REFERRING DIAG:  X73.532 (ICD-10-CM) - Cerebrovascular accident (CVA) due to bilateral embolism of posterior cerebral arteries (HCC)  G81.94 (ICD-10-CM) - Left hemiparesis (HCC)    THERAPY DIAG:  Other abnormalities of gait and mobility  Muscle weakness (generalized)  Unsteadiness on feet  Abnormal posture  Hemiplegia and hemiparesis following cerebral infarction affecting left non-dominant side (HCC)  Rationale for Evaluation and Treatment Rehabilitation  PERTINENT HISTORY: PMH:  AAA, anxiety, depression, skin cancer, DM, GERD, HLD, HTN, CVA, CAD, COPD  PRECAUTIONS:  Fall  SUBJECTIVE: "May have overdone the exercises as my legs are very sore, I did the  exercises on the worksheet but maybe too many reps". Notes that whenever he comes to a standing position feels unsteady/lightheaded/vertigo  PAIN:  Are you having pain? No   OBJECTIVE:   Vitals: Seated EOM: 154/73 mmHg, 58 bpm  Standing: 133/73 mmHg, 70 bpm (1 min and 3 min)  TODAY'S TREATMENT: 06/14/22 Activity Comments  Seated EOM LE AROM 2x10 -AP, LAQ, hip flexion  Orthostatic assessment See vitals. Pt and caregiver training in monitoring and recording at home--provided with instructions and tracking sheet  Gait training W/ RW--verbal cues for leading with LLE for exaggerated heel strike initial contact to promote left knee extension  Sit-stand  3x5 stride stance, LLE bias; 30 sec rest periods btwn sets  Sidestepping along counter 2 x 3 lengths, tactile cues to right knee at loading phase       TODAY'S TREATMENT: 06/10/2022 Activity Comments  Sit<>stand x 5 reps Cues each rep for correct  hand placement  Minisquats to elevated mat surface x 10 reps Cues for technique  Seated therex: March, 2 x 10, 2# weight LAQ, 2 x 10, 2# weight Seated step out and in, over 1" obstacle, for hip abduction 2 x 10  Seated hip adduction ball squeeze 2 x 10  REsisted hamstring curls, green theraband, 2 x 10 Ankle dorsiflexion, green band, 2 x 10 L ankle eversion, resisted green band x 5 reps, no resistance with pillowcase, x 10 reps for A/ROM Resisted green theraband hip abduction, 10 reps Fatigue on LLE after 6-7 reps and heel gets caught on stepping out/in over obstacle      Cues for eccentric control        With additional manual resistance for eccentric control  Gait 45 ft, RW, min assist and cues to place LLE in more abducted position to lessen scissoring            Access Code: [email or web address] URL: [email or web address] Date: 06/10/2022 Prepared by: [facility]  Exercises - Sit to Stand with Armchair  - 1-2 x daily - 7 x weekly -  1 sets - 5 reps - 3 sec hold - Seated Hip Abduction with Resistance  - 1 x daily - 5 x weekly - 2 sets - 10 reps - Seated Ankle Dorsiflexion with Resistance  - 1 x daily - 5 x weekly - 2 sets - 10 reps  PATIENT EDUCATION: Education details: HEP additions Person educated: Patient Education method: Explanation, Demonstration, and Handouts Education comprehension: verbalized understanding, returned demonstration, and needs further education  ---------------------------------------------------------------------------------- (objective measures completed at initial evaluation unless otherwise dated)   DIAGNOSTIC FINDINGS: Presented 07/27/2021 to [facility] with acute onset of left-sided weakness.  CT/MRI showed acute right posterior lentiform nucleus, thalamus, anterior caudate infarct with additional punctate infarct in the lateral left thalamus and possible the left  lentiform.  CT angiogram head and neck with mild atherosclerotic plaque of the bilateral carotid bulbs resulting in 30 to 40% stenosis on the right.  1 cm enhancing lesion in the right parotid gland indeterminate recommend ENT follow-up as outpatient.  MRA of the head showed no significant proximal stenosis aneurysm or occlusion.  Echocardiogram with ejection fraction of 60 to 57% grade 1 diastolic dysfunction.  TEE showed no left atrial appendage or thrombus detected.  No PFO.  (Per 07/2021 notes)   COGNITION: Overall cognitive status: Within functional limits for tasks assessed             SENSATION: Light touch: WFL Proprioception: WFL   COORDINATION: Decreased coordination, slowed movement pattern, LLE     MUSCLE TONE: LLE: Moderate     POSTURE: rounded shoulders and forward head   LOWER EXTREMITY ROM:      Active  Right Eval Left Eval  Hip flexion      Hip extension      Hip abduction      Hip adduction      Hip internal rotation      Hip external rotation      Knee flexion      Knee extension    Decreased full AROM (-24 degrees); passive -5 degrees  Ankle dorsiflexion      Ankle plantarflexion      Ankle inversion      Ankle eversion       (Blank rows = not tested)   LOWER EXTREMITY MMT:     MMT Right Eval Left Eval  Hip flexion 5 4  Hip extension      Hip abduction 5 4  Hip adduction 5 4  Hip internal rotation      Hip external rotation      Knee flexion 5 3+  Knee extension 5 3-  Ankle dorsiflexion 4 3+  Ankle plantarflexion      Ankle inversion   3+  Ankle eversion   3+  (Blank rows = not tested)     TRANSFERS: Assistive device utilized: Environmental consultant - 2 wheeled  Sit to stand: Min A Stand to sit: Min A LUE at walker, RUE pushes up from transport chair.  At times, he attempts to push up with BUEs on walker; he sits, keeping BUEs at walker.  Cues provided for correct hand placement for improved sit<>stand technique   GAIT: Gait pattern: step through pattern, decreased step length- Left, decreased ankle dorsiflexion- Left, knee flexed in stance- Right, knee flexed in stance- Left, scissoring, narrow BOS, and poor foot clearance- Left Distance walked: 40 ft Assistive device utilized: Environmental consultant - 2 wheeled Level of assistance: Min A Comments: 32.94 sec in 10 meter walk:  0.99 ft/sec   FUNCTIONAL TESTs:  5 times sit to stand: 27.97 sec with BUE support Timed up and go (TUG): 39.12 sec with RW Berg Balance Scale: 14/56 (Scores <45/56 indicate increased fall risk   PATIENT SURVEYS:  FOTO NA due to CVA >6 months ago   TODAY'S TREATMENT:  Sit<>stand from elevated mat height, simulating bed, x 5 reps, with cues for hand placement, foot placement; cues upon standing for glut/quad activation, upright posture.  HEP initiated-see below     PATIENT EDUCATION: Education details: Eval results, POC; initiated HEP-hand placement, safety awareness with sit<>Stand transfers Person educated: Patient Education method: Explanation, Demonstration, Tactile cues, Verbal cues, and  Handouts Education comprehension: verbalized understanding, returned demonstration, verbal cues required, and needs further education  HOME EXERCISE PROGRAM: Access Code: [email or web address] URL: [email or web address] Date: 06/07/2022 Prepared by: [facility]   Exercises - Sit to Stand with Armchair  - 1-2 x daily - 7 x weekly - 1 sets - 5 reps - 3 sec hold   -------------------------------------------------------------------------------------------    GOALS: Goals reviewed with patient? Yes   SHORT TERM GOALS: Target date: 07/02/2022   Pt will be supervision with HEP for improved strength, balance, gait. Baseline: Goal status: INITIAL   2.  Pt will improve 5x sit<>stand to less than or equal to 20 sec to demonstrate improved functional strength and transfer efficiency.  Baseline: 27.97 sec with definite UE support Goal status: INITIAL   3.  Pt will improve Berg score to at least 22/56 to decrease fall risk. Baseline: 14/56 Goal status: INITIAL     LONG TERM GOALS: Target date: 07/30/2022   Pt will be independent with HEP for improved strength, balance, transfers, and gait. Baseline:  Goal status: INITIAL   2.  Pt will improve 5x sit<>stand to less than or equal to 15 sec to demonstrate improved functional strength and transfer efficiency. Baseline: 27.97 sec BUE support at eval Goal status: INITIAL   3.  Pt will improve TUG score to less than or equal to 20 sec for decreased fall risk. Baseline: 39.12 sec with RW Goal status: INITIAL   4.  Pt will improve gait velocity to at least 1.8 ft/sec for improved gait efficiency and safety. Baseline: 0.99 ft/sec Goal status: INITIAL   5.  Pt will improve Berg score to at least 32/56 to decrease fall risk. Baseline: 14/56 Goal status: INITIAL   ASSESSMENT:   CLINICAL IMPRESSION: Pt notes feeling of lightheaded and report of "vertigo" when coming to a standing position.  Denies  any symptoms with laying down in bed or arising supine to sitting and reports this feeling when moving form sitting to standing.  BP monitoring reveals 21 mmHg drop between positions. Left knee buckling during mobility activities and exercises due to left quad weakness, tactile cues utilized throughout to promote recruitment and stability of left knee for enhanced loading response mechanics. Continued sessions to progress motor control, strength, coordination, activity tolerance and balance.   OBJECTIVE IMPAIRMENTS Abnormal gait, decreased balance, decreased coordination, decreased mobility, difficulty walking, decreased ROM, decreased strength, decreased safety awareness, dizziness, impaired flexibility, impaired tone, and postural dysfunction.    ACTIVITY LIMITATIONS sitting, standing, stairs, transfers, and locomotion level   PARTICIPATION LIMITATIONS: driving, community activity, and living independently   [place] Time since onset of injury/illness/exacerbation and 3+ comorbidities: see above  are also affecting patient's functional outcome.    REHAB POTENTIAL: Good   CLINICAL DECISION MAKING: Evolving/moderate complexity   EVALUATION COMPLEXITY: Moderate   PLAN: PT FREQUENCY: 2x/week   PT DURATION: 8 weeks, including eval week   PLANNED INTERVENTIONS: Therapeutic exercises, Therapeutic activity, Neuromuscular re-education, Balance training, Gait training, Patient/Family education, Self Care, Stair training, Vestibular training, Canalith repositioning, DME instructions, and Manual therapy   PLAN FOR NEXT SESSION: Continue sit<>Stand and addition of LLE tucked posteriorly for more weightbearing; review additions to HEP to address LLE strengthening-continue work on gluts, hamstrings, quads; work on [place], weightbearing through LLE with standing, transfers.  Toniann Fail, PT 06/14/2022, 11:04 AM  [facility] at [facility] [place], [address], [place] [address] Phone # [phone] Fax # [phone]

## 2022-06-16 ENCOUNTER — Encounter: Payer: Self-pay | Admitting: Physical Therapy

## 2022-06-16 ENCOUNTER — Ambulatory Visit: Payer: Medicare Other | Admitting: Physical Therapy

## 2022-06-16 ENCOUNTER — Ambulatory Visit: Payer: Medicare Other | Admitting: Occupational Therapy

## 2022-06-16 VITALS — BP 149/76 | HR 61

## 2022-06-16 DIAGNOSIS — M6281 Muscle weakness (generalized): Secondary | ICD-10-CM

## 2022-06-16 DIAGNOSIS — R2681 Unsteadiness on feet: Secondary | ICD-10-CM

## 2022-06-16 DIAGNOSIS — R278 Other lack of coordination: Secondary | ICD-10-CM | POA: Diagnosis not present

## 2022-06-16 DIAGNOSIS — I69354 Hemiplegia and hemiparesis following cerebral infarction affecting left non-dominant side: Secondary | ICD-10-CM | POA: Diagnosis not present

## 2022-06-16 DIAGNOSIS — R2689 Other abnormalities of gait and mobility: Secondary | ICD-10-CM | POA: Diagnosis not present

## 2022-06-16 DIAGNOSIS — R293 Abnormal posture: Secondary | ICD-10-CM | POA: Diagnosis not present

## 2022-06-16 NOTE — Therapy (Signed)
OUTPATIENT OCCUPATIONAL THERAPY NEURO  Treatment Session  Patient Name: Barry [name] MRN: [id number] DOB:[birth year], 88 y.o., male Today's Date: 06/16/2022  PCP: Susy Frizzle, MD REFERRING PROVIDER: Penni Bombard, MD    OT End of Session - 06/16/22 1326     Visit Number 3    Number of Visits 17    Date for OT Re-Evaluation 08/06/22    Authorization Type Medicare A &B; [employer] (secondary)    OT Start Time 1320    OT Stop Time 1402    OT Time Calculation (min) 42 min    Activity Tolerance Patient tolerated treatment well    Behavior During Therapy [facility] for tasks assessed/performed               Past Medical History:  Diagnosis Date   AAA (abdominal aortic aneurysm) ([place]) 12/17/2007   4.7 cm   Allergy    rhinitis   Anxiety    Cancer ([place])    skin   Cataract    Depression    Diabetes mellitus    Diverticulosis    GERD (gastroesophageal reflux disease)    Hepatitis    Hx of colonic polyps 08/26/2015   Hyperlipidemia    Hypertension    Nephrolithiasis    Personal history of colonic polyps    adenomas, serrated also   PVD (peripheral vascular disease) ([place])    Stroke ([place])    Tobacco abuse    Past Surgical History:  Procedure Laterality Date   COLONOSCOPY  2006, 2008, 2010, 05/27/2011   numerous adenomas - 13 in 2006, 3, 2008, 2 2012 (up to 1 cm), 4 diminutive adenomas, serrated adenomas 2012. Diverticulosis and hemorrhoids also.   EYE SURGERY     EYE SURGERY Left    Kidney stone operation     lower limb amputation, other toe 5th     percutaneous stent graft repair of infrarenal AAA  11/10   5.6 cm (T. early)   surgical excision basal cell carcinoma     TEE WITHOUT CARDIOVERSION N/A 07/29/2021   Procedure: TRANSESOPHAGEAL ECHOCARDIOGRAM (TEE);  Surgeon: Geralynn Rile, MD;  Location: [facility];  Service: Cardiovascular;  Laterality: N/A;   tympanic eardrum repair     VASECTOMY     Patient Active Problem List   Diagnosis Date  Noted   Right thalamic infarction ([place]) 07/31/2021   Stage 3a chronic kidney disease ([place])    Benign essential HTN    Left hemiparesis ([place])    Type 2 diabetes mellitus with hyperglycemia, without long-term current use of insulin ([place])    CVA (cerebral vascular accident) ([place]) 07/27/2021   Frequent PVCs 03/20/2020   Aortic stenosis 03/20/2020   COPD (chronic obstructive pulmonary disease) ([place]) 03/20/2020   Constipation 04/30/2019   Protein-calorie malnutrition ([place]) 04/30/2019   Anxiety with depression 12/05/2018   Seasonal and perennial allergic rhinitis 08/09/2018   Carotid artery disease ([place]) 11/18/2017   Hx of colonic polyps 08/26/2015   Erectile dysfunction 02/17/2015   AAA (abdominal aortic aneurysm) without rupture ([place]) 05/21/2014   Chest pain 01/31/2014   CAD (coronary artery disease) 12/24/2013   HEARING LOSS, CONDUCTIVE, LEFT 02/23/2010   DIZZINESS, CHRONIC 05/28/2008   PULMONARY NODULE 09/07/2007   Diabetes mellitus type II, controlled ([place]) 09/06/2007   Hyperlipidemia 09/06/2007   TOBACCO ABUSE 09/06/2007   Essential hypertension 09/06/2007   Allergic rhinitis 09/06/2007   GERD 09/06/2007   BASAL CELL CARCINOMA, NOSE 09/06/2007    ONSET DATE: 07/27/2021  REFERRING  DIAG: N36.144 (ICD-10-CM) - Cerebrovascular accident (CVA) due to bilateral embolism of posterior cerebral arteries (HCC) G81.94 (ICD-10-CM) - Left hemiparesis   THERAPY DIAG:  Hemiplegia and hemiparesis following cerebral infarction affecting left non-dominant side (HCC)  Other lack of coordination  Muscle weakness (generalized)  Unsteadiness on feet  Rationale for Evaluation and Treatment Rehabilitation  SUBJECTIVE:   SUBJECTIVE STATEMENT: Pt reports BP elevated this morning, is wondering if it has to do with his vertigo.  Pt accompanied by: self and family member (son remained in waiting room)  PERTINENT HISTORY: CAD, COPD, type 2 diabetes, hypertension, GERD, allergic rhinitis, and  protein calorie malnutrition   PRECAUTIONS: Fall  WEIGHT BEARING RESTRICTIONS No  PAIN:  Are you having pain? No  FALLS: Has patient fallen in last 6 months? No  LIVING ENVIRONMENT: Lives with: lives with their family and was living independently prior to CVA but is now living with son and his wife Lives in: House/apartment Stairs: Yes: Internal: full flight to go upstairs, he uses a stair lift to get upstairs for shower, but otherwise remains on main floor steps; and External: 2 steps at front and 6 steps at back steps; uses RW to get down steps Has following equipment at home: Gilford Rile - 2 wheeled, Wheelchair (manual), Shower bench, bed side commode, Grab bars, and transport chair  PLOF: Independent, Independent with homemaking with ambulation, and Independent with gait  PATIENT GOALS to be able to walk again  OBJECTIVE:   HAND DOMINANCE: Right  ADLs: Transfers/ambulation related to ADLs: Mod I toilet transfers from w/c, Supervision shower transfers UB Dressing: Mod I, difficulty with managing buttons LB Dressing: Mod I from sit > stand level, decreased coordination with tying shoes Bathing: Min-mod assist for bathing Tub Shower transfers: Supervision for stand pivot transfer w/c <> shower chair/bench Equipment: Transfer tub bench, Grab bars, Walk in shower, and bed side commode   IADLs: Light housekeeping: family is performing majority of these tasks, but reports that he feels he could do some of these things Meal Prep: not currently performing, family may leave prepared food for him if they are out Community mobility: was driving prior to stroke but has not returned to driving Medication management: Son is providing assist   UPPER EXTREMITY ROM     Active ROM Right eval Left eval  Shoulder flexion 120 108  Shoulder abduction    Shoulder adduction    Shoulder extension    Shoulder internal rotation [facility]  Shoulder external rotation [facility]  Elbow flexion [facility] WFL   Elbow extension [facility] WFL  Wrist flexion    Wrist extension    Wrist ulnar deviation    Wrist radial deviation    Wrist pronation    Wrist supination    (Blank rows = not tested)   UPPER EXTREMITY MMT:     MMT Right eval Left eval  Shoulder flexion 5/5 4/5  Shoulder abduction    Shoulder adduction    Shoulder extension    Shoulder internal rotation    Shoulder external rotation    Middle trapezius    Lower trapezius    Elbow flexion  4/5  Elbow extension  4/5  Wrist flexion    Wrist extension    Wrist ulnar deviation    Wrist radial deviation    Wrist pronation    Wrist supination    (Blank rows = not tested)  COORDINATION: Finger Nose Finger test: mild to moderate dysmetria on L 9 Hole Peg test: Right: 36.13  sec; Left: 51.91 sec Box and Blocks:  Right 43 blocks, Left 34 blocks Rapid hand alternating movements: pt with decreased speed and coordination on L  COGNITION: Overall cognitive status:  reports some loss of memory of instances from before the stroke, reports difficulty with dates on important information from prior to stroke.  Reports no issues with STM or current recall of information.  Will need to be further assessed during next session and during functional tasks  VISION: Subjective report: no changes Baseline vision: Wears glasses for reading only  VISION ASSESSMENT: To be further assessed in functional context  Patient has difficulty with following activities due to following visual impairments: IP rehab noted some L inattention, pt does not report any during eval.  Will need to be further assessed in functional context -------------------------------------------------------------------------------------------------------------------------------------------------------- (objective measures above completed at initial evaluation unless otherwise dated)   TODAY'S TREATMENT:  Resistance Clothespins 1-8# with LUE for mid and high functional reaching and  sustained pinch. Pt req'd min cues for movement pattern and maintaining good positioning of LUE and trunk with reach. Attempted in standing, however pt reports increase in dizziness with standing - rating 8/10 dizziness and requiring min assist to maintain standing with BUE support, therefore pt placed clothespins while seated.  Stood to remove clothespins with min assist for standing balance and single UE support on table.  Pt tolerated standing 3 mins to place and remove pegs with min assist with attempts to fade to supervision - however pt demonstrating increased L lean and reports of dizziness with prolonged standing.  No change in vitals with standing or prolonged standing this session. Coordination: small peg board pattern replication with LUE.  Pt demonstrating difficulty with placing pegs one by one, dropping 30% of pegs.    Vitals Vitals:   06/16/22 1327  BP: (!) 149/76  Pulse: 61       PATIENT EDUCATION: Education details: Educated on fine motor control tasks and provided with HEP. Person educated: Patient Education method: Explanation, Verbal cues, and Handouts Education comprehension: verbalized understanding and needs further education   HOME EXERCISE PROGRAM: Fine motor control HEP    GOALS: Goals reviewed with patient? Yes  SHORT TERM GOALS: Target date: 07/09/2022  Pt will be independent with FMC/GMC HEP to increase independence with ADLs and IADLs. Baseline: Goal status: IN PROGRESS  2.  Pt will verbalize understanding of task modifications, adaptive strategies, and/or potential AE needs to increase ease, safety, and independence w/ ADLs and IADLs. Baseline:  Goal status: IN PROGRESS  3.  Pt will demonstrate improved fine motor coordination for ADLs (buttons, tying shoes) as evidenced by decreasing 9 hole peg test score for LUE by 5 secs Baseline: 9 Hole Peg test: Right: 36.13 sec; Left: 51.91 sec Goal status: IN PROGRESS   LONG TERM GOALS: Target date:  08/06/2022  Pt will perform dynamic standing task for simple IADLs w/o LOB using DME and/or countertop support prn. Baseline:  Goal status: IN PROGRESS  2.  Pt will demonstrate improved fine motor coordination for ADLs as evidenced by decreasing 9 hole peg test score for LUE by 8 secs Baseline: 9 Hole Peg test: Right: 36.13 sec; Left: 51.91 sec Goal status: IN PROGRESS  3.  Pt will demonstrate improved UE functional use for ADLs as evidenced by increasing box/ blocks score by 4 blocks with LUE Baseline: Box and Blocks:  Right 43 blocks, Left 34 blocks Goal status: IN PROGRESS  4.  Pt will demonstrate ability to safely complete functional  bilateral IADL task (e.g., folding clothes, making a sandwich, microwave use, making the bed) w/ Mod I Baseline:  Goal status: IN PROGRESS  5. Pt will demonstrate awareness of return to driving recommendations.  Baseline:  Goal status: IN PROGRESS   ASSESSMENT:  CLINICAL IMPRESSION: Pt participating well with therapy sessions when seated, however demonstrates increased dizziness and instability when attempting to incorporate standing.  Pt continues to demonstrate mild impairments with fine and gross motor tasks with LUE.  PERFORMANCE DEFICITS in functional skills including ADLs, IADLs, coordination, dexterity, ROM, strength, pain, FMC, GMC, mobility, balance, body mechanics, endurance, decreased knowledge of precautions, decreased knowledge of use of DME, and UE functional use, cognitive skills including memory, and psychosocial skills including environmental adaptation.   IMPAIRMENTS are limiting patient from ADLs and IADLs.   COMORBIDITIES may have co-morbidities  that affects occupational performance. Patient will benefit from skilled OT to address above impairments and improve overall function.  MODIFICATION OR ASSISTANCE TO COMPLETE EVALUATION: Min-Moderate modification of tasks or assist with assess necessary to complete an evaluation.  OT  OCCUPATIONAL PROFILE AND HISTORY: Detailed assessment: Review of records and additional review of physical, cognitive, psychosocial history related to current functional performance.  CLINICAL DECISION MAKING: LOW - limited treatment options, no task modification necessary  REHAB POTENTIAL: Good  EVALUATION COMPLEXITY: Low    PLAN: OT FREQUENCY: 1-2x/week  OT DURATION: 8 weeks  PLANNED INTERVENTIONS: self care/ADL training, therapeutic exercise, therapeutic activity, neuromuscular re-education, passive range of motion, balance training, functional mobility training, electrical stimulation, ultrasound, moist heat, cryotherapy, patient/family education, cognitive remediation/compensation, energy conservation, coping strategies training, and DME and/or AE instructions  RECOMMENDED OTHER SERVICES: NA  CONSULTED AND AGREED WITH PLAN OF CARE: Patient and family member/caregiver  PLAN FOR NEXT SESSION: Review coordination HEP, engage in BUE functional tasks, transfers, functional reach and standing balance   Jourden Gilson, [place], OTR/L 06/16/2022, 2:38 PM

## 2022-06-16 NOTE — Therapy (Signed)
OUTPATIENT PHYSICAL THERAPY TREATMENT NOTE   Patient Name: Barry [name] MRN: [id number] DOB:1933/03/25, 88 y.o., male Today's Date: 06/16/2022  PCP:  Jenna Luo, MD REFERRING PROVIDER: Penni Bombard, MD   END OF SESSION:   PT End of Session - 06/16/22 1406     Visit Number 4    Number of Visits 16    Date for PT Re-Evaluation 07/30/22    Authorization Type Medicare/[employer]    Progress Note Due on Visit 10    PT Start Time 1402    PT Stop Time 4098    PT Time Calculation (min) 45 min    Equipment Utilized During Treatment Gait belt    Activity Tolerance Patient tolerated treatment well    Behavior During Therapy [facility] for tasks assessed/performed             Past Medical History:  Diagnosis Date   AAA (abdominal aortic aneurysm) ([place]) 12/17/2007   4.7 cm   Allergy    rhinitis   Anxiety    Cancer ([place])    skin   Cataract    Depression    Diabetes mellitus    Diverticulosis    GERD (gastroesophageal reflux disease)    Hepatitis    Hx of colonic polyps 08/26/2015   Hyperlipidemia    Hypertension    Nephrolithiasis    Personal history of colonic polyps    adenomas, serrated also   PVD (peripheral vascular disease) ([place])    Stroke ([place])    Tobacco abuse    Past Surgical History:  Procedure Laterality Date   COLONOSCOPY  2006, 2008, 2010, 05/27/2011   numerous adenomas - 13 in 2006, 3, 2008, 2 2012 (up to 1 cm), 4 diminutive adenomas, serrated adenomas 2012. Diverticulosis and hemorrhoids also.   EYE SURGERY     EYE SURGERY Left    Kidney stone operation     lower limb amputation, other toe 5th     percutaneous stent graft repair of infrarenal AAA  11/10   5.6 cm (T. early)   surgical excision basal cell carcinoma     TEE WITHOUT CARDIOVERSION N/A 07/29/2021   Procedure: TRANSESOPHAGEAL ECHOCARDIOGRAM (TEE);  Surgeon: Geralynn Rile, MD;  Location: [facility];  Service: Cardiovascular;  Laterality: N/A;   tympanic eardrum repair      VASECTOMY     Patient Active Problem List   Diagnosis Date Noted   Right thalamic infarction ([place]) 07/31/2021   Stage 3a chronic kidney disease ([place])    Benign essential HTN    Left hemiparesis ([place])    Type 2 diabetes mellitus with hyperglycemia, without long-term current use of insulin ([place])    CVA (cerebral vascular accident) ([place]) 07/27/2021   Frequent PVCs 03/20/2020   Aortic stenosis 03/20/2020   COPD (chronic obstructive pulmonary disease) ([place]) 03/20/2020   Constipation 04/30/2019   Protein-calorie malnutrition ([place]) 04/30/2019   Anxiety with depression 12/05/2018   Seasonal and perennial allergic rhinitis 08/09/2018   Carotid artery disease ([place]) 11/18/2017   Hx of colonic polyps 08/26/2015   Erectile dysfunction 02/17/2015   AAA (abdominal aortic aneurysm) without rupture ([place]) 05/21/2014   Chest pain 01/31/2014   CAD (coronary artery disease) 12/24/2013   HEARING LOSS, CONDUCTIVE, LEFT 02/23/2010   DIZZINESS, CHRONIC 05/28/2008   PULMONARY NODULE 09/07/2007   Diabetes mellitus type II, controlled ([place]) 09/06/2007   Hyperlipidemia 09/06/2007   TOBACCO ABUSE 09/06/2007   Essential hypertension 09/06/2007   Allergic rhinitis 09/06/2007   GERD 09/06/2007  BASAL CELL CARCINOMA, NOSE 09/06/2007    REFERRING DIAG:  P80.998 (ICD-10-CM) - Cerebrovascular accident (CVA) due to bilateral embolism of posterior cerebral arteries (HCC)  G81.94 (ICD-10-CM) - Left hemiparesis (HCC)    THERAPY DIAG:  Muscle weakness (generalized)  Unsteadiness on feet  Other abnormalities of gait and mobility  Rationale for Evaluation and Treatment Rehabilitation  PERTINENT HISTORY: PMH:  AAA, anxiety, depression, skin cancer, DM, GERD, HLD, HTN, CVA, CAD, COPD  PRECAUTIONS:  Fall  SUBJECTIVE: "May have overdone the exercises as my legs are very sore, I did the exercises on the worksheet but maybe too many reps". Notes that whenever he comes to a standing position feels  unsteady/lightheaded/vertigo  PAIN:  Are you having pain? No   OBJECTIVE:   VESTIBULAR ASSESSMENT   GENERAL OBSERVATION: Reports dizziness (unsteadiness) upon standing.  In standing, pt standing with B knees flexed.    SYMPTOM BEHAVIOR:   Subjective history: Reports having hx of vertigo (distant past).  Reports dizziness upon standing and walking.  "Feel like if I could stop the vertigo, I could walk better.  Worse since the CVA.  Have it all the time if I stand up.   Non-Vestibular symptoms:  CVA   Type of dizziness: Imbalance (Disequilibrium), Unsteady with head/body turns, and Lightheadedness/Faint   Frequency: every time he stands up   Duration: as long as he is standing   Aggravating factors: Induced by position change: sit to stand   Relieving factors:  sitting down   Progression of symptoms: worse since CVA   OCULOMOTOR EXAM:   Ocular Alignment: normal   Ocular ROM: No Limitations   Spontaneous Nystagmus: absent   Gaze-Induced Nystagmus: left beating with right gaze   Smooth Pursuits: intact   Saccades: intact   Convergence/Divergence: NT    VESTIBULAR - OCULAR REFLEX:    Slow VOR: Normal   VOR Cancellation: Corrective Saccades   Head-Impulse Test: HIT Right: negative HIT Left: positive   Dynamic Visual Acuity: Not able to be assessed    POSITIONAL TESTING:  Right Roll Test: No nystagmus; Duration: none Left Roll Test: no nystagmus; Duration: none Right Dix-Hallpike: no nystagmus, no dizziness; Duration:none.  Mild, brief dizziness upon return to sit Left Dix-Hallpike: no nystagmus; Duration: none    (Per OT report, vitals assessed and no significant changes in BP measures with change in positions)  TODAY'S TREATMENT: 06/16/2022 Activity Comments  Vestibular assessment above Some abnormal eye movements (nystagmus and saccades), but no reports of dizziness in sitting  Seated core stability with alt UE/leg lifts x 5 reps, 2 sets Cues to keep upright posture and  avoid posterior lean  Lateral trunk flexion and reaching outside of BOS, x 5 reps, 2 sets   Forward trunk flexion to upright posture, 2 x 5 reps   Standing lateral weigthshfiting 2 x 10 reps,  Cues for tall posture for glut/quad activation upon weigthshift to LLE  [place] to upright posture x 10 reps    Access Code: [email or web address] URL: [email or web address] Date: 06/16/2022-most recent additions Prepared by: [facility]  Exercises - Sit to Stand with Armchair  - 1-2 x daily - 7 x weekly - 1 sets - 5 reps - 3 sec hold - Seated Hip Abduction with Resistance  - 1 x daily - 5 x weekly - 2 sets - 10 reps - Seated Ankle Dorsiflexion with Resistance  - 1 x daily - 5 x weekly - 2 sets - 10 reps -  Side to side weightshift  - 1 x daily - 5 x weekly - 1-2 sets - 10 reps    PATIENT EDUCATION: Education details: HEP additions Person educated: Patient and Son Education method: Explanation, Demonstration, and Handouts Education comprehension: verbalized understanding, returned demonstration, and needs further education  ---------------------------------------------------------------------------------- (objective measures completed at initial evaluation unless otherwise dated)   DIAGNOSTIC FINDINGS: [facility] with acute onset of left-sided weakness.  CT/MRI showed acute right posterior lentiform nucleus, thalamus, anterior caudate infarct with additional punctate infarct in the lateral left thalamus and possible the left lentiform.  CT angiogram head and neck with mild atherosclerotic plaque of the bilateral carotid bulbs resulting in 30 to 40% stenosis on the right.  1 cm enhancing lesion in the right parotid gland indeterminate recommend ENT follow-up as outpatient.  MRA of the head showed no significant proximal stenosis aneurysm or occlusion.  Echocardiogram with ejection fraction of 60 to 92% grade 1 diastolic  dysfunction.  TEE showed no left atrial appendage or thrombus detected.  No PFO.  (Per 07/2021 notes)   COGNITION: Overall cognitive status: Within functional limits for tasks assessed             SENSATION: Light touch: WFL Proprioception: WFL   COORDINATION: Decreased coordination, slowed movement pattern, LLE     MUSCLE TONE: LLE: Moderate     POSTURE: rounded shoulders and forward head   LOWER EXTREMITY ROM:      Active  Right Eval Left Eval  Hip flexion      Hip extension      Hip abduction      Hip adduction      Hip internal rotation      Hip external rotation      Knee flexion      Knee extension   Decreased full AROM (-24 degrees); passive -5 degrees  Ankle dorsiflexion      Ankle plantarflexion      Ankle inversion      Ankle eversion       (Blank rows = not tested)   LOWER EXTREMITY MMT:     MMT Right Eval Left Eval  Hip flexion 5 4  Hip extension      Hip abduction 5 4  Hip adduction 5 4  Hip internal rotation      Hip external rotation      Knee flexion 5 3+  Knee extension 5 3-  Ankle dorsiflexion 4 3+  Ankle plantarflexion      Ankle inversion   3+  Ankle eversion   3+  (Blank rows = not tested)     TRANSFERS: Assistive device utilized: Environmental consultant - 2 wheeled  Sit to stand: Min A Stand to sit: Min A LUE at walker, RUE pushes up from transport chair.  At times, he attempts to push up with BUEs on walker; he sits, keeping BUEs at walker.  Cues provided for correct hand placement for improved sit<>stand technique   GAIT: Gait pattern: step through pattern, decreased step length- Left, decreased ankle dorsiflexion- Left, knee flexed in stance- Right, knee flexed in stance- Left, scissoring, narrow BOS, and poor foot clearance- Left Distance walked: 40 ft Assistive device utilized: Walker - 2 wheeled Level of assistance: Min A Comments: 32.94 sec in 10 meter walk:  0.99 ft/sec   FUNCTIONAL TESTs:  5 times sit to stand: 27.97 sec with BUE  support Timed up and go (TUG): 39.12 sec with RW Berg Balance Scale: 14/56 (  Scores <45/56 indicate increased fall risk   PATIENT SURVEYS:  FOTO NA due to CVA >6 months ago   TODAY'S TREATMENT:  Sit<>stand from elevated mat height, simulating bed, x 5 reps, with cues for hand placement, foot placement; cues upon standing for glut/quad activation, upright posture.  HEP initiated-see below     PATIENT EDUCATION: Education details: Eval results, POC; initiated HEP-hand placement, safety awareness with sit<>Stand transfers Person educated: Patient Education method: Explanation, Demonstration, Tactile cues, Verbal cues, and Handouts Education comprehension: verbalized understanding, returned demonstration, verbal cues required, and needs further education     HOME EXERCISE PROGRAM: Access Code: [email or web address] URL: [email or web address] Date: 06/07/2022 Prepared by: [facility]   Exercises - Sit to Stand with Armchair  - 1-2 x daily - 7 x weekly - 1 sets - 5 reps - 3 sec hold   -------------------------------------------------------------------------------------------    GOALS: Goals reviewed with patient? Yes   SHORT TERM GOALS: Target date: 07/02/2022   Pt will be supervision with HEP for improved strength, balance, gait. Baseline: Goal status: INITIAL   2.  Pt will improve 5x sit<>stand to less than or equal to 20 sec to demonstrate improved functional strength and transfer efficiency.  Baseline: 27.97 sec with definite UE support Goal status: INITIAL   3.  Pt will improve Berg score to at least 22/56 to decrease fall risk. Baseline: 14/56 Goal status: INITIAL     LONG TERM GOALS: Target date: 07/30/2022   Pt will be independent with HEP for improved strength, balance, transfers, and gait. Baseline:  Goal status: INITIAL   2.  Pt will improve 5x sit<>stand to less than or equal to 15 sec to demonstrate improved functional  strength and transfer efficiency. Baseline: 27.97 sec BUE support at eval Goal status: INITIAL   3.  Pt will improve TUG score to less than or equal to 20 sec for decreased fall risk. Baseline: 39.12 sec with RW Goal status: INITIAL   4.  Pt will improve gait velocity to at least 1.8 ft/sec for improved gait efficiency and safety. Baseline: 0.99 ft/sec Goal status: INITIAL   5.  Pt will improve Berg score to at least 32/56 to decrease fall risk. Baseline: 14/56 Goal status: INITIAL   ASSESSMENT:   CLINICAL IMPRESSION: Based on pt's reports of dizziness and vertigo, PT goes through vestibular assessment today.  Pt does discern that the feeling is unsteadiness and like he is moving, no spinning sensation and it occurs while he is standing (PT did not assess orthostatic BP measures today, but OT notes no changes and no significant changes in BP noted with sheet that pt brought in with measures from home today).  With oculomotor testing, pt does have some end range nystagmus that is present in L eye, and he has correct saccades with VOR and positive HIT to right side.  While he does not c/o dizziness with testing, this may indicates decreased vestibular system function.  Initiated standing weightshifting exercises to be performed with son supervision, to address posture and weightshifting in standing.  He will continue to benefit from skilled PT sessions to progress motor control, strength, coordination, activity tolerance and balance.   OBJECTIVE IMPAIRMENTS Abnormal gait, decreased balance, decreased coordination, decreased mobility, difficulty walking, decreased ROM, decreased strength, decreased safety awareness, dizziness, impaired flexibility, impaired tone, and postural dysfunction.    ACTIVITY LIMITATIONS sitting, standing, stairs, transfers, and locomotion level   PARTICIPATION LIMITATIONS: driving, community activity, and living independently  PERSONAL FACTORS Time since onset of  injury/illness/exacerbation and 3+ comorbidities: see above  are also affecting patient's functional outcome.    REHAB POTENTIAL: Good   CLINICAL DECISION MAKING: Evolving/moderate complexity   EVALUATION COMPLEXITY: Moderate   PLAN: PT FREQUENCY: 2x/week   PT DURATION: 8 weeks, including eval week   PLANNED INTERVENTIONS: Therapeutic exercises, Therapeutic activity, Neuromuscular re-education, Balance training, Gait training, Patient/Family education, Self Care, Stair training, Vestibular training, Canalith repositioning, DME instructions, and Manual therapy   PLAN FOR NEXT SESSION: Continue sit<>Stand and addition of LLE tucked posteriorly for more weightbearing; review additions to HEP to address standing and weightshifting-continue work on gluts, hamstrings, quads; work on [place], weightbearing through LLE with standing, transfers.  Frazier Butt., PT 06/16/2022, 4:01 PM   Outpatient Rehab at [facility] [place], [place], [place] [address] Phone # [phone] Fax # [phone]

## 2022-06-17 DIAGNOSIS — I10 Essential (primary) hypertension: Secondary | ICD-10-CM

## 2022-06-17 DIAGNOSIS — E1159 Type 2 diabetes mellitus with other circulatory complications: Secondary | ICD-10-CM | POA: Diagnosis not present

## 2022-06-19 ENCOUNTER — Other Ambulatory Visit: Payer: Self-pay | Admitting: Family Medicine

## 2022-06-23 ENCOUNTER — Ambulatory Visit: Payer: Medicare Other

## 2022-06-23 ENCOUNTER — Ambulatory Visit: Payer: Medicare Other | Admitting: Occupational Therapy

## 2022-06-28 ENCOUNTER — Encounter: Payer: Self-pay | Admitting: Physical Therapy

## 2022-06-28 ENCOUNTER — Ambulatory Visit: Payer: Medicare Other | Attending: Diagnostic Neuroimaging | Admitting: Occupational Therapy

## 2022-06-28 ENCOUNTER — Ambulatory Visit: Payer: Medicare Other | Admitting: Physical Therapy

## 2022-06-28 DIAGNOSIS — I69354 Hemiplegia and hemiparesis following cerebral infarction affecting left non-dominant side: Secondary | ICD-10-CM

## 2022-06-28 DIAGNOSIS — R293 Abnormal posture: Secondary | ICD-10-CM | POA: Diagnosis not present

## 2022-06-28 DIAGNOSIS — M6281 Muscle weakness (generalized): Secondary | ICD-10-CM

## 2022-06-28 DIAGNOSIS — R2681 Unsteadiness on feet: Secondary | ICD-10-CM

## 2022-06-28 DIAGNOSIS — R278 Other lack of coordination: Secondary | ICD-10-CM | POA: Diagnosis not present

## 2022-06-28 DIAGNOSIS — R2689 Other abnormalities of gait and mobility: Secondary | ICD-10-CM

## 2022-06-28 NOTE — Therapy (Signed)
OUTPATIENT OCCUPATIONAL THERAPY NEURO  Treatment Session  Patient Name: Barry [name] MRN: [id number] DOB:10/18/1933, 88 y.o., male Today's Date: 06/28/2022  PCP: Susy Frizzle, MD REFERRING PROVIDER: Penni Bombard, MD    OT End of Session - 06/28/22 1316     Visit Number 4    Number of Visits 17    Date for OT Re-Evaluation 08/06/22    Authorization Type Medicare A &B; [employer] (secondary)    OT Start Time 1316    OT Stop Time 1400    OT Time Calculation (min) 44 min    Activity Tolerance Patient tolerated treatment well    Behavior During Therapy [facility] for tasks assessed/performed                Past Medical History:  Diagnosis Date   AAA (abdominal aortic aneurysm) ([place]) 12/17/2007   4.7 cm   Allergy    rhinitis   Anxiety    Cancer ([place])    skin   Cataract    Depression    Diabetes mellitus    Diverticulosis    GERD (gastroesophageal reflux disease)    Hepatitis    Hx of colonic polyps 08/26/2015   Hyperlipidemia    Hypertension    Nephrolithiasis    Personal history of colonic polyps    adenomas, serrated also   PVD (peripheral vascular disease) ([place])    Stroke ([place])    Tobacco abuse    Past Surgical History:  Procedure Laterality Date   COLONOSCOPY  2006, 2008, 2010, 05/27/2011   numerous adenomas - 13 in 2006, 3, 2008, 2 2012 (up to 1 cm), 4 diminutive adenomas, serrated adenomas 2012. Diverticulosis and hemorrhoids also.   EYE SURGERY     EYE SURGERY Left    Kidney stone operation     lower limb amputation, other toe 5th     percutaneous stent graft repair of infrarenal AAA  11/10   5.6 cm (T. early)   surgical excision basal cell carcinoma     TEE WITHOUT CARDIOVERSION N/A 07/29/2021   Procedure: TRANSESOPHAGEAL ECHOCARDIOGRAM (TEE);  Surgeon: Geralynn Rile, MD;  Location: [facility];  Service: Cardiovascular;  Laterality: N/A;   tympanic eardrum repair     VASECTOMY     Patient Active Problem List   Diagnosis Date  Noted   Right thalamic infarction ([place]) 07/31/2021   Stage 3a chronic kidney disease ([place])    Benign essential HTN    Left hemiparesis ([place])    Type 2 diabetes mellitus with hyperglycemia, without long-term current use of insulin ([place])    CVA (cerebral vascular accident) ([place]) 07/27/2021   Frequent PVCs 03/20/2020   Aortic stenosis 03/20/2020   COPD (chronic obstructive pulmonary disease) ([place]) 03/20/2020   Constipation 04/30/2019   Protein-calorie malnutrition ([place]) 04/30/2019   Anxiety with depression 12/05/2018   Seasonal and perennial allergic rhinitis 08/09/2018   Carotid artery disease ([place]) 11/18/2017   Hx of colonic polyps 08/26/2015   Erectile dysfunction 02/17/2015   AAA (abdominal aortic aneurysm) without rupture ([place]) 05/21/2014   Chest pain 01/31/2014   CAD (coronary artery disease) 12/24/2013   HEARING LOSS, CONDUCTIVE, LEFT 02/23/2010   DIZZINESS, CHRONIC 05/28/2008   PULMONARY NODULE 09/07/2007   Diabetes mellitus type II, controlled ([place]) 09/06/2007   Hyperlipidemia 09/06/2007   TOBACCO ABUSE 09/06/2007   Essential hypertension 09/06/2007   Allergic rhinitis 09/06/2007   GERD 09/06/2007   BASAL CELL CARCINOMA, NOSE 09/06/2007    ONSET DATE: 07/27/2021  REFERRING DIAG: W97.989 (ICD-10-CM) - Cerebrovascular accident (CVA) due to bilateral embolism of posterior cerebral arteries (HCC) G81.94 (ICD-10-CM) - Left hemiparesis   THERAPY DIAG:  Hemiplegia and hemiparesis following cerebral infarction affecting left non-dominant side (HCC)  Other lack of coordination  Muscle weakness (generalized)  Unsteadiness on feet  Rationale for Evaluation and Treatment Rehabilitation  SUBJECTIVE:   SUBJECTIVE STATEMENT: Pt reports "I'm standing better today than before".  Pt accompanied by: self and family member (son remained in waiting room)  PERTINENT HISTORY: CAD, COPD, type 2 diabetes, hypertension, GERD, allergic rhinitis, and protein calorie malnutrition    PRECAUTIONS: Fall  WEIGHT BEARING RESTRICTIONS No  PAIN:  Are you having pain? No.  Reports only pain when he attempts standing.  FALLS: Has patient fallen in last 6 months? No  LIVING ENVIRONMENT: Lives with: lives with their family and was living independently prior to CVA but is now living with son and his wife Lives in: House/apartment Stairs: Yes: Internal: full flight to go upstairs, he uses a stair lift to get upstairs for shower, but otherwise remains on main floor steps; and External: 2 steps at front and 6 steps at back steps; uses RW to get down steps Has following equipment at home: Gilford Rile - 2 wheeled, Wheelchair (manual), Shower bench, bed side commode, Grab bars, and transport chair  PLOF: Independent, Independent with homemaking with ambulation, and Independent with gait  PATIENT GOALS to be able to walk again  OBJECTIVE:   HAND DOMINANCE: Right  ADLs: Transfers/ambulation related to ADLs: Mod I toilet transfers from w/c, Supervision shower transfers UB Dressing: Mod I, difficulty with managing buttons LB Dressing: Mod I from sit > stand level, decreased coordination with tying shoes Bathing: Min-mod assist for bathing Tub Shower transfers: Supervision for stand pivot transfer w/c <> shower chair/bench Equipment: Transfer tub bench, Grab bars, Walk in shower, and bed side commode   IADLs: Light housekeeping: family is performing majority of these tasks, but reports that he feels he could do some of these things Meal Prep: not currently performing, family may leave prepared food for him if they are out Community mobility: was driving prior to stroke but has not returned to driving Medication management: Son is providing assist   UPPER EXTREMITY ROM     Active ROM Right eval Left eval  Shoulder flexion 120 108  Shoulder abduction    Shoulder adduction    Shoulder extension    Shoulder internal rotation [facility]  Shoulder external rotation [facility]   Elbow flexion [facility] WFL  Elbow extension [facility] WFL  Wrist flexion    Wrist extension    Wrist ulnar deviation    Wrist radial deviation    Wrist pronation    Wrist supination    (Blank rows = not tested)   UPPER EXTREMITY MMT:     MMT Right eval Left eval  Shoulder flexion 5/5 4/5  Shoulder abduction    Shoulder adduction    Shoulder extension    Shoulder internal rotation    Shoulder external rotation    Middle trapezius    Lower trapezius    Elbow flexion  4/5  Elbow extension  4/5  Wrist flexion    Wrist extension    Wrist ulnar deviation    Wrist radial deviation    Wrist pronation    Wrist supination    (Blank rows = not tested)  COORDINATION: Finger Nose Finger test: mild to moderate dysmetria on L 9 Hole Peg test: Right:  36.13 sec; Left: 51.91 sec 06/28/22: Left: 45.09 sec Box and Blocks:  Right 43 blocks, Left 34 blocks 06/28/22: Left 33 blocks Rapid hand alternating movements: pt with decreased speed and coordination on L  COGNITION: Overall cognitive status:  reports some loss of memory of instances from before the stroke, reports difficulty with dates on important information from prior to stroke.  Reports no issues with STM or current recall of information.  Will need to be further assessed during next session and during functional tasks  VISION: Subjective report: no changes Baseline vision: Wears glasses for reading only  VISION ASSESSMENT: To be further assessed in functional context  Patient has difficulty with following activities due to following visual impairments: IP rehab noted some L inattention, pt does not report any during eval.  Will need to be further assessed in functional context -------------------------------------------------------------------------------------------------------------------------------------------------------- (objective measures above completed at initial evaluation unless otherwise dated)   TODAY'S TREATMENT:   GMC: stacking cups in 10 frame pyramid form to facilitate increased functional reach.  Pt demonstrating good control when picking up cups, however demonstrating difficulty with increased shoulder flexion when attempting to stack cups with LUE.  Pt reports discomfort in L shoulder with increased reach.  Discussed recommendation to have shoulder checked out, as pt reports injury prior to CVA.  Dynamic standing: Increased challenge to complete cup stacking activity in standing.  Pt tolerating standing 4 mins with min guard due to instability in standing and reports of dizziness in standing (decreased from previous sessions).  Pt utilizing LUE as above while maintaining standing.  Mild impairments when reaching with LUE due to pain in shoulder and decreased coordination s/p CVA.   Coordination: placing variety of sized pegs into pegboard in standing with LUE.  Pt demonstrating increased difficulty when attempting to pick up smaller pegs.  Therapist placed towel underneath pegs to increase friction to allow for increased ease when picking up pegs.  Pt standing 4 mins with close supervision.  Assessed 9 hole peg test and Box and blocks assessment due to increased FMC/GMC during session (see above for measurements) Shoulder ROM: Pt demonstrating 80-90* shoulder flexion without onset of pain when removing pegs from peg board on elevated table top.  Engaged in [place] glide in sitting with focus on facilitating increased shoulder flexion within pain free range.  Pt reports no pain in flexion, however reports fatigue.  Pt does report onset of pain with abduction to 30-45* from midline.   Transitional movements: completed stand pivot transfers with mod assist initially due to impulsivity and attempting to transfer without w/c brakes locked.  CGA for stand pivot back to w/c at end of session with improved hand placement and sequencing.    PATIENT EDUCATION: Education details: Educated on fine motor control tasks and  provided with HEP. Person educated: Patient Education method: Explanation, Verbal cues, and Handouts Education comprehension: verbalized understanding and needs further education   HOME EXERCISE PROGRAM: Fine motor control HEP    GOALS: Goals reviewed with patient? Yes  SHORT TERM GOALS: Target date: 07/09/2022  Pt will be independent with FMC/GMC HEP to increase independence with ADLs and IADLs. Baseline: Goal status: IN PROGRESS  2.  Pt will verbalize understanding of task modifications, adaptive strategies, and/or potential AE needs to increase ease, safety, and independence w/ ADLs and IADLs. Baseline:  Goal status: IN PROGRESS  3.  Pt will demonstrate improved fine motor coordination for ADLs (buttons, tying shoes) as evidenced by decreasing 9 hole peg test score for  LUE by 5 secs Baseline: 9 Hole Peg test: Right: 36.13 sec; Left: 51.91 sec Goal status: MET - Left: 45.09 sec on 06/28/22   LONG TERM GOALS: Target date: 08/06/2022  Pt will perform dynamic standing task for simple IADLs w/o LOB using DME and/or countertop support prn. Baseline:  Goal status: IN PROGRESS  2.  Pt will demonstrate improved fine motor coordination for ADLs as evidenced by decreasing 9 hole peg test score for LUE by 10 secs Baseline: 9 Hole Peg test: Right: 36.13 sec; Left: 51.91 sec Goal status: REVISED increased from 8 sec improvement to 10 sec improvement based on progress as of 06/28/22  3.  Pt will demonstrate improved UE functional use for ADLs as evidenced by increasing box/ blocks score by 4 blocks with LUE Baseline: Box and Blocks:  Right 43 blocks, Left 34 blocks Goal status: IN PROGRESS  4.  Pt will demonstrate ability to safely complete functional bilateral IADL task (e.g., folding clothes, making a sandwich, microwave use, making the bed) w/ Mod I Baseline:  Goal status: IN PROGRESS  5. Pt will demonstrate awareness of return to driving recommendations.  Baseline:  Goal status:  IN PROGRESS   ASSESSMENT:  CLINICAL IMPRESSION: Pt participating well with therapy sessions, demonstrating increased standing balance and endurance this session.  Pt continues to ask questions and voice uncertainty that his impairments on LUE are from previous shoulder injury or from CVA - therapist recommends pt get shoulder checked out since he does have h/o injury.  Pt continues to demonstrate mild impairments with fine and gross motor tasks with LUE.  PERFORMANCE DEFICITS in functional skills including ADLs, IADLs, coordination, dexterity, ROM, strength, pain, FMC, GMC, mobility, balance, body mechanics, endurance, decreased knowledge of precautions, decreased knowledge of use of DME, and UE functional use, cognitive skills including memory, and psychosocial skills including environmental adaptation.   IMPAIRMENTS are limiting patient from ADLs and IADLs.   COMORBIDITIES may have co-morbidities  that affects occupational performance. Patient will benefit from skilled OT to address above impairments and improve overall function.  MODIFICATION OR ASSISTANCE TO COMPLETE EVALUATION: Min-Moderate modification of tasks or assist with assess necessary to complete an evaluation.  OT OCCUPATIONAL PROFILE AND HISTORY: Detailed assessment: Review of records and additional review of physical, cognitive, psychosocial history related to current functional performance.  CLINICAL DECISION MAKING: LOW - limited treatment options, no task modification necessary  REHAB POTENTIAL: Good  EVALUATION COMPLEXITY: Low    PLAN: OT FREQUENCY: 1-2x/week  OT DURATION: 8 weeks  PLANNED INTERVENTIONS: self care/ADL training, therapeutic exercise, therapeutic activity, neuromuscular re-education, passive range of motion, balance training, functional mobility training, electrical stimulation, ultrasound, moist heat, cryotherapy, patient/family education, cognitive remediation/compensation, energy conservation,  coping strategies training, and DME and/or AE instructions  RECOMMENDED OTHER SERVICES: NA  CONSULTED AND AGREED WITH PLAN OF CARE: Patient and family member/caregiver  PLAN FOR NEXT SESSION: Review coordination HEP, engage in BUE functional tasks, transfers, functional reach and standing balance   Clever Geraldo, OTR/L 06/28/2022, 1:17 PM

## 2022-06-28 NOTE — Therapy (Addendum)
OUTPATIENT PHYSICAL THERAPY TREATMENT NOTE   Patient Name: Barry [name] MRN: [id number] DOB:1933-09-30, 88 y.o., male Today's Date: 06/28/2022  PCP:  Jenna Luo, MD REFERRING PROVIDER: Penni Bombard, MD   END OF SESSION:   PT End of Session - 06/28/22 1357     Visit Number 5    Number of Visits 16    Date for PT Re-Evaluation 07/30/22    Authorization Type Medicare/[employer]    Progress Note Due on Visit 10    PT Start Time 1400    PT Stop Time 7341    PT Time Calculation (min) 45 min    Equipment Utilized During Treatment Gait belt    Activity Tolerance Patient tolerated treatment well    Behavior During Therapy [facility] for tasks assessed/performed              Past Medical History:  Diagnosis Date   AAA (abdominal aortic aneurysm) ([place]) 12/17/2007   4.7 cm   Allergy    rhinitis   Anxiety    Cancer ([place])    skin   Cataract    Depression    Diabetes mellitus    Diverticulosis    GERD (gastroesophageal reflux disease)    Hepatitis    Hx of colonic polyps 08/26/2015   Hyperlipidemia    Hypertension    Nephrolithiasis    Personal history of colonic polyps    adenomas, serrated also   PVD (peripheral vascular disease) ([place])    Stroke ([place])    Tobacco abuse    Past Surgical History:  Procedure Laterality Date   COLONOSCOPY  2006, 2008, 2010, 05/27/2011   numerous adenomas - 13 in 2006, 3, 2008, 2 2012 (up to 1 cm), 4 diminutive adenomas, serrated adenomas 2012. Diverticulosis and hemorrhoids also.   EYE SURGERY     EYE SURGERY Left    Kidney stone operation     lower limb amputation, other toe 5th     percutaneous stent graft repair of infrarenal AAA  11/10   5.6 cm (T. early)   surgical excision basal cell carcinoma     TEE WITHOUT CARDIOVERSION N/A 07/29/2021   Procedure: TRANSESOPHAGEAL ECHOCARDIOGRAM (TEE);  Surgeon: Geralynn Rile, MD;  Location: [facility];  Service: Cardiovascular;  Laterality: N/A;   tympanic eardrum repair      VASECTOMY     Patient Active Problem List   Diagnosis Date Noted   Right thalamic infarction ([place]) 07/31/2021   Stage 3a chronic kidney disease ([place])    Benign essential HTN    Left hemiparesis ([place])    Type 2 diabetes mellitus with hyperglycemia, without long-term current use of insulin ([place])    CVA (cerebral vascular accident) ([place]) 07/27/2021   Frequent PVCs 03/20/2020   Aortic stenosis 03/20/2020   COPD (chronic obstructive pulmonary disease) ([place]) 03/20/2020   Constipation 04/30/2019   Protein-calorie malnutrition ([place]) 04/30/2019   Anxiety with depression 12/05/2018   Seasonal and perennial allergic rhinitis 08/09/2018   Carotid artery disease ([place]) 11/18/2017   Hx of colonic polyps 08/26/2015   Erectile dysfunction 02/17/2015   AAA (abdominal aortic aneurysm) without rupture ([place]) 05/21/2014   Chest pain 01/31/2014   CAD (coronary artery disease) 12/24/2013   HEARING LOSS, CONDUCTIVE, LEFT 02/23/2010   DIZZINESS, CHRONIC 05/28/2008   PULMONARY NODULE 09/07/2007   Diabetes mellitus type II, controlled ([place]) 09/06/2007   Hyperlipidemia 09/06/2007   TOBACCO ABUSE 09/06/2007   Essential hypertension 09/06/2007   Allergic rhinitis 09/06/2007   GERD  09/06/2007   BASAL CELL CARCINOMA, NOSE 09/06/2007    REFERRING DIAG:  Y09.983 (ICD-10-CM) - Cerebrovascular accident (CVA) due to bilateral embolism of posterior cerebral arteries (HCC)  G81.94 (ICD-10-CM) - Left hemiparesis (HCC)    THERAPY DIAG:  Unsteadiness on feet  Muscle weakness (generalized)  Other abnormalities of gait and mobility  Rationale for Evaluation and Treatment Rehabilitation  PERTINENT HISTORY: PMH:  AAA, anxiety, depression, skin cancer, DM, GERD, HLD, HTN, CVA, CAD, COPD  PRECAUTIONS:  Fall  SUBJECTIVE: Don't feel so well.  Have had a headache today. Maybe didn't sleep too much.  PAIN:  Are you having pain? Yes: NPRS scale: 5/10 Pain location: headache-frontal Pain description:  headache Aggravating factors: unsure Relieving factors: unsure   OBJECTIVE:   Vitals:  150/78, HR 61 bpm prior to exercise  TODAY'S TREATMENT: 06/28/2022 Activity Comments  Sit<>stand from w/c to parallel bars, x 5 reps, then from w/c<>RW x 3 reps Cues for hand placement each rep; cues for eccentric control  Standing lateral weightshifts x 10 reps Cues for tall posture  [place] to upright posture x 10 reps Cues for tall posture  Forward/back walking 3 reps, then added LLE weight 3#, in parallel bars Tactile cues for wider BOS  Sidestepping at parallel bars, 3 reps, 3# weight Cues for posture, foot clearance  Standing hip abduction, 3# weight LLE, 2 x 10 reps, each leg   Standing hamstring curl 3# LLE, no weight RLE, x 10 reps   Seated hamstring stretch, 3 reps LLE x 30 sec Foot propped on 4" block and assist for position  Gait 30 ft x 4 reps with RW, using white/gray tile as visual target to widen BOS, CGA Pt with good ability to use this target, fatigues with last rep, more crouched gait pattern  Gait 25 ft x 2 with RW cues to widen BOS, min A   NuStep Level 4, 4 extremities x 4 minutes, BLEs only x 4 minutes RLE needs to be re-positioned several times; cues to keep speed approx 70 SPM      Access Code: [email or web address] URL: [email or web address] Date: 06/16/2022-most recent additions Prepared by: [facility]  Exercises - Sit to Stand with Armchair  - 1-2 x daily - 7 x weekly - 1 sets - 5 reps - 3 sec hold - Seated Hip Abduction with Resistance  - 1 x daily - 5 x weekly - 2 sets - 10 reps - Seated Ankle Dorsiflexion with Resistance  - 1 x daily - 5 x weekly - 2 sets - 10 reps - Side to side weightshift  - 1 x daily - 5 x weekly - 1-2 sets - 10 reps    ---------------------------------------------------------------------------------- (objective measures completed at initial evaluation unless otherwise dated)   DIAGNOSTIC  FINDINGS: [facility] with acute onset of left-sided weakness.  CT/MRI showed acute right posterior lentiform nucleus, thalamus, anterior caudate infarct with additional punctate infarct in the lateral left thalamus and possible the left lentiform.  CT angiogram head and neck with mild atherosclerotic plaque of the bilateral carotid bulbs resulting in 30 to 40% stenosis on the right.  1 cm enhancing lesion in the right parotid gland indeterminate recommend ENT follow-up as outpatient.  MRA of the head showed no significant proximal stenosis aneurysm or occlusion.  Echocardiogram with ejection fraction of 60 to 38% grade 1 diastolic dysfunction.  TEE showed no left atrial appendage or thrombus detected.  No PFO.  (  Per 07/2021 notes)   COGNITION: Overall cognitive status: Within functional limits for tasks assessed             SENSATION: Light touch: WFL Proprioception: WFL   COORDINATION: Decreased coordination, slowed movement pattern, LLE     MUSCLE TONE: LLE: Moderate     POSTURE: rounded shoulders and forward head   LOWER EXTREMITY ROM:      Active  Right Eval Left Eval  Hip flexion      Hip extension      Hip abduction      Hip adduction      Hip internal rotation      Hip external rotation      Knee flexion      Knee extension   Decreased full AROM (-24 degrees); passive -5 degrees  Ankle dorsiflexion      Ankle plantarflexion      Ankle inversion      Ankle eversion       (Blank rows = not tested)   LOWER EXTREMITY MMT:     MMT Right Eval Left Eval  Hip flexion 5 4  Hip extension      Hip abduction 5 4  Hip adduction 5 4  Hip internal rotation      Hip external rotation      Knee flexion 5 3+  Knee extension 5 3-  Ankle dorsiflexion 4 3+  Ankle plantarflexion      Ankle inversion   3+  Ankle eversion   3+  (Blank rows = not tested)     TRANSFERS: Assistive device utilized: Environmental consultant - 2 wheeled  Sit to stand: Min A Stand  to sit: Min A LUE at walker, RUE pushes up from transport chair.  At times, he attempts to push up with BUEs on walker; he sits, keeping BUEs at walker.  Cues provided for correct hand placement for improved sit<>stand technique   GAIT: Gait pattern: step through pattern, decreased step length- Left, decreased ankle dorsiflexion- Left, knee flexed in stance- Right, knee flexed in stance- Left, scissoring, narrow BOS, and poor foot clearance- Left Distance walked: 40 ft Assistive device utilized: Environmental consultant - 2 wheeled Level of assistance: Min A Comments: 32.94 sec in 10 meter walk:  0.99 ft/sec   FUNCTIONAL TESTs:  5 times sit to stand: 27.97 sec with BUE support Timed up and go (TUG): 39.12 sec with RW Berg Balance Scale: 14/56 (Scores <45/56 indicate increased fall risk   PATIENT SURVEYS:  FOTO NA due to CVA >6 months ago   TODAY'S TREATMENT:  Sit<>stand from elevated mat height, simulating bed, x 5 reps, with cues for hand placement, foot placement; cues upon standing for glut/quad activation, upright posture.  HEP initiated-see below     PATIENT EDUCATION: Education details: Eval results, POC; initiated HEP-hand placement, safety awareness with sit<>Stand transfers Person educated: Patient Education method: Explanation, Demonstration, Tactile cues, Verbal cues, and Handouts Education comprehension: verbalized understanding, returned demonstration, verbal cues required, and needs further education     HOME EXERCISE PROGRAM: Access Code: [email or web address] URL: [email or web address] Date: 06/07/2022 Prepared by: [facility]   Exercises - Sit to Stand with Armchair  - 1-2 x daily - 7 x weekly - 1 sets - 5 reps - 3 sec hold   -------------------------------------------------------------------------------------------    GOALS: Goals reviewed with patient? Yes   SHORT TERM GOALS: Target date: 07/02/2022   Pt will be supervision with HEP  for improved strength,  balance, gait. Baseline: Goal status: INITIAL   2.  Pt will improve 5x sit<>stand to less than or equal to 20 sec to demonstrate improved functional strength and transfer efficiency.  Baseline: 27.97 sec with definite UE support Goal status: INITIAL   3.  Pt will improve Berg score to at least 22/56 to decrease fall risk. Baseline: 14/56 Goal status: INITIAL     LONG TERM GOALS: Target date: 07/30/2022   Pt will be independent with HEP for improved strength, balance, transfers, and gait. Baseline:  Goal status: INITIAL   2.  Pt will improve 5x sit<>stand to less than or equal to 15 sec to demonstrate improved functional strength and transfer efficiency. Baseline: 27.97 sec BUE support at eval Goal status: INITIAL   3.  Pt will improve TUG score to less than or equal to 20 sec for decreased fall risk. Baseline: 39.12 sec with RW Goal status: INITIAL   4.  Pt will improve gait velocity to at least 1.8 ft/sec for improved gait efficiency and safety. Baseline: 0.99 ft/sec Goal status: INITIAL   5.  Pt will improve Berg score to at least 32/56 to decrease fall risk. Baseline: 14/56 Goal status: INITIAL   ASSESSMENT:   CLINICAL IMPRESSION: Skilled PT session focused on standing stregnthening and NMR to LLE.  Pt able to initially perform standing exercises with cues for glut/quad activation, but with fatigue, goes into more crouched posture in standing.  With gait training, pt responds well to cues for use of visual target to widen BOS; again, with fatigue, he returns to crouched gait pattern.  He will continue to benefit from skilled PT towards goals for improved functional mobility and decrease fall risk.   OBJECTIVE IMPAIRMENTS Abnormal gait, decreased balance, decreased coordination, decreased mobility, difficulty walking, decreased ROM, decreased strength, decreased safety awareness, dizziness, impaired flexibility, impaired tone, and postural dysfunction.     ACTIVITY LIMITATIONS sitting, standing, stairs, transfers, and locomotion level   PARTICIPATION LIMITATIONS: driving, community activity, and living independently   [place] Time since onset of injury/illness/exacerbation and 3+ comorbidities: see above  are also affecting patient's functional outcome.    REHAB POTENTIAL: Good   CLINICAL DECISION MAKING: Evolving/moderate complexity   EVALUATION COMPLEXITY: Moderate   PLAN: PT FREQUENCY: 2x/week   PT DURATION: 8 weeks, including eval week   PLANNED INTERVENTIONS: Therapeutic exercises, Therapeutic activity, Neuromuscular re-education, Balance training, Gait training, Patient/Family education, Self Care, Stair training, Vestibular training, [place] repositioning, DME instructions, and Manual therapy   PLAN FOR NEXT SESSION: Check STGs and make sure pt has appts through POC.  review additions to HEP to address standing and weightshifting-continue work on gluts, hamstrings, quads; work on [place], weightbearing through LLE with standing, transfers, wider BOS with gait  Madalynne Gutmann W., PT 06/28/2022, 5:15 PM  [place] [facility] [address], [place], [place] [address] Phone # [phone] Fax # [phone]

## 2022-06-28 NOTE — Progress Notes (Deleted)
Chronic Care Management Pharmacy Note   Summary: PharmD Follow up.  Now approved for Farxiga for free this year.  Plans to get in pill packs for adherence.  Will be due for A1c next month.  Also due to recheck lipids to see how Crestor has helped.   06/28/2022 Name:  Barry [name] MRN:  [id number] DOB:  01-30-1933  Subjective: Barry [name] is an 88 y.o. year old male who is a primary patient of Pickard, Cammie Mcgee, MD.  The CCM team was consulted for assistance with disease management and care coordination needs.    Engaged with patient by telephone for follow up visit in response to provider referral for pharmacy case management and/or care coordination services.   Consent to Services:  The patient was given the following information about Chronic Care Management services today, agreed to services, and gave verbal consent: 1. CCM service includes personalized support from designated clinical staff supervised by the primary care provider, including individualized plan of care and coordination with other care providers 2. 24/7 contact phone numbers for assistance for urgent and routine care needs. 3. Service will only be billed when office clinical staff spend 20 minutes or more in a month to coordinate care. 4. Only one practitioner may furnish and bill the service in a calendar month. 5.The patient may stop CCM services at any time (effective at the end of the month) by phone call to the office staff. 6. The patient will be responsible for cost sharing (co-pay) of up to 20% of the service fee (after annual deductible is met). Patient agreed to services and consent obtained.  Patient Care Team: Susy Frizzle, MD as PCP - General (Family Medicine) Early, Arvilla Meres, MD (Vascular Surgery) Sherlynn Stalls, MD (Ophthalmology) Allyn Kenner, MD (Dermatology) Josue Hector, MD (Cardiology) Edythe Clarity, [facility] as Pharmacist (Pharmacist) Cloria Spring, MD as Consulting Physician  ([place])  Recent office visits:  03/09/21 Dr. Dennard Schaumann For follow-up. No medication changes.    Recent consult visits:  04/02/21 (Video Visit) [place] Cloria Spring, MD. For generalized anxiety.    Hospital visits:  07/27/21 (ED - Hospital) - bilateral CVA, metoprolol and amlodipine were added due to elevated BP  Objective:  Lab Results  Component Value Date   CREATININE 1.34 (H) 01/04/2022   BUN 32 (H) 01/04/2022   GFR 76.82 07/10/2013   GFRNONAA >60 10/05/2021   GFRAA 66 03/19/2021   NA 139 01/04/2022   K 4.1 01/04/2022   CALCIUM 9.2 01/04/2022   CO2 28 01/04/2022    Lab Results  Component Value Date/Time   HGBA1C 8.1 (H) 01/04/2022 04:30 PM   HGBA1C 6.7 (H) 07/27/2021 12:31 PM   GFR 76.82 07/10/2013 12:13 PM   GFR 69.02 06/06/2012 10:45 AM   MICROALBUR 232.9 03/19/2021 09:15 AM   MICROALBUR 25.6 11/17/2017 08:38 AM    Last diabetic Eye exam:  Lab Results  Component Value Date/Time   HMDIABEYEEXA Retinopathy (A) 03/03/2021 10:05 AM    Last diabetic Foot exam:  Lab Results  Component Value Date/Time   HMDIABFOOTEX yes 04/29/2009 12:00 AM     Lab Results  Component Value Date   CHOL 164 07/27/2021   HDL 64 07/27/2021   LDLCALC 84 07/27/2021   LDLDIRECT 152.6 05/28/2008   TRIG 81 07/27/2021   CHOLHDL 2.6 07/27/2021       Latest Ref Rng & Units 08/03/2021    5:43 AM 07/27/2021   12:31 PM 03/19/2021  9:15 AM  Hepatic Function  Total Protein 6.5 - 8.1 g/dL 5.4  7.9  5.8   Albumin 3.5 - 5.0 g/dL 2.4  4.4    AST 15 - 41 U/L '15  25  18   ' ALT 0 - 44 U/L '13  17  12   ' Alk Phosphatase 38 - 126 U/L 36  49    Total Bilirubin 0.3 - 1.2 mg/dL 0.6  0.5  0.3     Lab Results  Component Value Date/Time   TSH 1.266 12/21/2018 07:21 AM   TSH 2.071 10/27/2018 06:58 AM   TSH 2.080 02/05/2015 08:17 AM   TSH 1.59 06/06/2012 10:45 AM       Latest Ref Rng & Units 01/04/2022    4:30 PM 10/05/2021    1:09 PM 08/03/2021    5:43 AM  CBC  WBC 3.8  - 10.8 Thousand/uL 14.1  9.2  9.5   Hemoglobin 13.2 - 17.1 g/dL 13.0  13.8  13.5   Hematocrit 38.5 - 50.0 % 39.7  42.8  40.1   Platelets 140 - 400 Thousand/uL 237  302  231     Lab Results  Component Value Date/Time   VD25OH 23.10 (L) 08/04/2021 05:20 AM    Clinical ASCVD: Yes  The ASCVD Risk score (Arnett DK, et al., 2019) failed to calculate for the following reasons:   The 2019 ASCVD risk score is only valid for ages 23 to 19   The patient has a prior MI or stroke diagnosis       12/25/2021    2:11 PM 12/15/2021    1:53 PM 10/23/2021    2:00 PM  Depression screen PHQ 2/9  Decreased Interest 0 1 0  Down, Depressed, Hopeless '2 2 3  ' PHQ - 2 Score '2 3 3  ' Altered sleeping 0 0 1  Tired, decreased energy 0 2 1  Change in appetite 0 0 0  Feeling bad or failure about yourself  0 1 0  Trouble concentrating 0 0 1  Moving slowly or fidgety/restless 0 1 0  Suicidal thoughts 0 0 0  PHQ-9 Score '2 7 6  ' Difficult doing work/chores Somewhat difficult Somewhat difficult       Social History   Tobacco Use  Smoking Status Former   Packs/day: 1.00   Years: 60.00   Total pack years: 60.00   Types: Cigarettes  Smokeless Tobacco Never  Tobacco Comments   Hasn't smoked since he had his stroke    BP Readings from Last 3 Encounters:  06/16/22 (!) 149/76  02/23/22 140/68  01/04/22 138/64   Pulse Readings from Last 3 Encounters:  06/16/22 61  02/23/22 69  01/04/22 69   Wt Readings from Last 3 Encounters:  01/04/22 162 lb (73.5 kg)  12/25/21 170 lb (77.1 kg)  11/03/21 165 lb 6.4 oz (75 kg)    Assessment/Interventions: Review of patient past medical history, allergies, medications, health status, including review of consultants reports, laboratory and other test data, was performed as part of comprehensive evaluation and provision of chronic care management services.   SDOH:  (Social Determinants of Health) assessments and interventions performed: No SDOH Interventions     Flowsheet Row Clinical Support from 12/25/2021 in [place] Video Visit from 12/15/2021 in confidential department Chronic Care Management from 10/23/2021 in [place] Interventions     Food Insecurity Interventions Intervention Not Indicated -- Intervention Not Indicated  Housing Interventions Intervention Not Indicated -- --  Transportation Interventions Intervention Not Indicated -- Intervention Not Indicated  Depression Interventions/Treatment  Counseling  [facility] counseling session with Dr. Harrington Challenger out of [place] last Monday.] Currently on Treatment Medication  Financial Strain Interventions Intervention Not Indicated -- --  Physical Activity Interventions Exercise Physiologist for Individual Counseling -- --  Stress Interventions Other (Comment)  [Related to recent health changes. Sees Dr. Harrington Challenger for counseling.] -- --  Social Connections Interventions Other (Comment)  [Lives with son and daughter in law.] -- --       [place]  Allergies  Allergen Reactions   Lisinopril-Hydrochlorothiazide Swelling    ZESTRIL   Doxycycline Rash    Medications Reviewed Today     Reviewed by Frazier Butt, PT (Physical Therapist) on 06/16/22 at 1406  Med List Status: <None>   Medication Order Taking? Sig Documenting Provider Last Dose Status Informant  acetaminophen (TYLENOL) 325 MG tablet [id number] No Take 2 tablets (650 mg total) by mouth every 6 (six) hours as needed for mild pain (or Fever >/= 101). Cathlyn Parsons, PA-C Taking Active   amLODipine (NORVASC) 10 MG tablet [id number] No Take 1 tablet (10 mg total) by mouth daily. Susy Frizzle, MD Taking Active   artificial tears (LACRILUBE) OINT ophthalmic ointment [id number] No Place into both eyes every 4 (four) hours as needed for dry eyes. Angiulli, Lavon Paganini, PA-C Taking Active   B Complex-C (B-COMPLEX WITH VITAMIN C) tablet [id number] No Take 1 tablet by mouth daily. Cathlyn Parsons, PA-C  Taking Active   buPROPion (WELLBUTRIN XL) 150 MG 24 hr tablet [id number] No TAKE 1 TABLET (150 MG TOTAL) BY MOUTH DAILY. Susy Frizzle, MD Taking Active   clonazePAM (KLONOPIN) 0.5 MG tablet [id number] No TAKE 1/2 TABLET BY MOUTH 3 TIMES DAILY AS NEEDED FOR ANXIETY. Susy Frizzle, MD Taking Active   clopidogrel (PLAVIX) 75 MG tablet [id number] No Take 1 tablet (75 mg total) by mouth daily. Susy Frizzle, MD Taking Active   diclofenac Sodium (VOLTAREN) 1 % GEL [id number] No Apply 2 g topically 4 (four) times daily. Cathlyn Parsons, PA-C Taking Active   ezetimibe (ZETIA) 10 MG tablet [id number] No TAKE 1 TABLET BY MOUTH DAILY Susy Frizzle, MD Taking Active   FLUoxetine (PROZAC) 10 MG capsule [id number] No Take 1 capsule (10 mg total) by mouth daily.  Patient not taking: Reported on 06/07/2022   Cloria Spring, MD Not Taking Active   FLUoxetine (PROZAC) 20 MG capsule [id number] No TAKE (1) CAPSULE BY MOUTH EACH MORNING. Cloria Spring, MD Taking Active   fluticasone [facility]) 50 MCG/ACT nasal spray [id number] No Place 2 sprays into both nostrils daily. Susy Frizzle, MD Taking Active   gabapentin (NEURONTIN) 300 MG capsule [id number] No Take 1 capsule (300 mg total) by mouth 2 (two) times daily as needed (nerve pain). Susy Frizzle, MD Taking Active   glipiZIDE (GLIPIZIDE XL) 5 MG 24 hr tablet [id number] No Take 1 tablet (5 mg total) by mouth daily with breakfast. Susy Frizzle, MD Taking Active   hydrALAZINE (APRESOLINE) 100 MG tablet [id number] No TAKE 1 TABLET BY MOUTH EVERY 8 HOURS. Susy Frizzle, MD Taking Active   magnesium gluconate (MAGONATE) 500 MG tablet [id number] No Take 0.5 tablets (250 mg total) by mouth at bedtime.  Patient not taking: Reported on 06/07/2022   Angiulli, Lavon Paganini, PA-C Not Taking Active            Med Note (CHESSON, MARY C  Wed Nov 11, 2021  3:03 PM) 11/11/21 son has to pick up  metoprolol succinate (TOPROL-XL) 25 MG 24 hr tablet [id number] No  Take 0.5 tablets (12.5 mg total) by mouth daily. Susy Frizzle, MD Taking Active   montelukast (SINGULAIR) 10 MG tablet [id number] No Take 1 tablet (10 mg total) by mouth at bedtime. Susy Frizzle, MD Taking Active   rOPINIRole (REQUIP) 0.25 MG tablet [id number] No Take 1 tablet (0.25 mg total) by mouth at bedtime. Susy Frizzle, MD Taking Active   rosuvastatin (CRESTOR) 20 MG tablet [id number] No Take 1 tablet (20 mg total) by mouth daily. Susy Frizzle, MD Taking Active   sitaGLIPtin (JANUVIA) 100 MG tablet [id number] No Take 1 tablet (100 mg total) by mouth daily. Susy Frizzle, MD Taking Active   sulfamethoxazole-trimethoprim (BACTRIM DS) 800-160 MG tablet [id number] No Take 1 tablet by mouth 2 (two) times daily.  Patient not taking: Reported on 06/07/2022   Susy Frizzle, MD Not Taking Active   valsartan (DIOVAN) 160 MG tablet [id number] No Take 1 tablet (160 mg total) by mouth 2 (two) times daily. Susy Frizzle, MD Taking Active   vitamin B-12 (CYANOCOBALAMIN) 1000 MCG tablet [id number] No Take 1,000 mcg by mouth daily. [provider] Taking Active Multiple Informants            Patient Active Problem List   Diagnosis Date Noted   Right thalamic infarction ([place]) 07/31/2021   Stage 3a chronic kidney disease ([place])    Benign essential HTN    Left hemiparesis ([place])    Type 2 diabetes mellitus with hyperglycemia, without long-term current use of insulin ([place])    CVA (cerebral vascular accident) ([place]) 07/27/2021   Frequent PVCs 03/20/2020   Aortic stenosis 03/20/2020   COPD (chronic obstructive pulmonary disease) ([place]) 03/20/2020   Constipation 04/30/2019   Protein-calorie malnutrition ([place]) 04/30/2019   Anxiety with depression 12/05/2018   Seasonal and perennial allergic rhinitis 08/09/2018   Carotid artery disease ([place]) 11/18/2017   Hx of colonic polyps 08/26/2015   Erectile dysfunction 02/17/2015   AAA (abdominal aortic aneurysm) without rupture  ([place]) 05/21/2014   Chest pain 01/31/2014   CAD (coronary artery disease) 12/24/2013   HEARING LOSS, CONDUCTIVE, LEFT 02/23/2010   DIZZINESS, CHRONIC 05/28/2008   PULMONARY NODULE 09/07/2007   Diabetes mellitus type II, controlled ([place]) 09/06/2007   Hyperlipidemia 09/06/2007   TOBACCO ABUSE 09/06/2007   Essential hypertension 09/06/2007   Allergic rhinitis 09/06/2007   GERD 09/06/2007   BASAL CELL CARCINOMA, NOSE 09/06/2007    Immunization History  Administered Date(s) Administered   Fluad Quad(high Dose 65+) 07/12/2019, 12/15/2020, 08/12/2021   H1N1 10/15/2008   Influenza Split 07/12/2011, 08/18/2012   Influenza Whole 09/25/2007, 08/23/2008, 08/25/2010   Influenza, High Dose Seasonal PF 08/01/2013, 08/16/2014, 09/05/2018   Influenza-Unspecified 08/05/2015, 08/28/2016, 09/02/2017   Moderna Sars-Covid-2 Vaccination 11/19/2019, 12/17/2019, 11/18/2020   Pneumococcal Conjugate-13 02/17/2015   Td 06/06/2012    Conditions to be addressed/monitored:  HTN, CAD, GERD, Type II DM, HLD,  Depression/Anxiety.    There are no care plans that you recently modified to display for this patient.    Medication Assistance: None required.  Patient affirms current coverage meets needs.  Patient's preferred pharmacy is:  [facility], [address] [place] [address] Phone: [phone] Fax: [phone]   Uses pill box? Yes Pt endorses 100% compliance  We discussed: Benefits of medication synchronization, packaging and  delivery as well as enhanced pharmacist oversight with [facility]. Patient decided to: Utilize [facility] pharmacy for medication synchronization, packaging and delivery  Verbal consent obtained for [facility] enhanced pharmacy services (medication synchronization, adherence packaging, delivery coordination). A medication sync plan was created to allow patient to get all medications delivered once every 30 to 90  days per patient preference. Patient understands they have freedom to choose pharmacy and clinical pharmacist will coordinate care between all prescribers and [facility].  Care Plan and Follow Up Patient Decision:  Patient agrees to Care Plan and Follow-up.  Plan: The care management team will reach out to the patient again over the next 180 days.  Beverly Milch, PharmD, CPP Clinical Pharmacist Practitioner [facility] [phone]   Current Barriers:  Unable to independently monitor therapeutic efficacy Unable to maintain control of blood pressure LDL above goal  Pharmacist Clinical Goal(s):  Over the next 180 days, patient will achieve adherence to monitoring guidelines and medication adherence to achieve therapeutic efficacy achieve control of blood pressure as evidenced by home monitoring maintain control of blood sugar as evidenced by A1c/glucose   adhere to prescribed medication regimen as evidenced by fill dates through collaboration with PharmD and provider.   Interventions: 1:1 collaboration with Buelah Manis, Modena Nunnery, MD regarding development and update of comprehensive plan of care as evidenced by provider attestation and co-signature Inter-disciplinary care team collaboration (see longitudinal plan of care) Comprehensive medication review performed; medication list updated in electronic medical record  Hypertension (BP goal <130/80) -Not ideally controlled -Current treatment: Metoprolol XL 12.3m Amlodipine 17mdaily Hydralazine 10029m8h Valsartan 160m52mily -Medications previously tried: lisinopril/HCTZ (swelling), Nifedipine (pt d/c on his own)  -Current home readings: unavailable, pt not really checking -Denies hypotensive/hypertensive symptoms -Educated on BP goals and benefits of medications for prevention of heart attack, stroke and kidney damage; Importance of home blood pressure monitoring; Symptoms of hypotension and importance  of maintaining adequate hydration; -Counseled to monitor BP at home at least a few times per week, document, and provide log at future appointments -He does not monitor often due to getting worked up over it -He stopped nifedipine on his own and his last office blood pressure was WNL.  He reports he was not taking it at this time. -Counseled him on need for home monitoring to adjust medications or monitor for increased/decreased BP/ -Recommended to continue current medication Recommended follow up with cardiology on nifedipine.  Update 10/01/21 BP @ home?? - they have not been checking BP at home, but they did just get a cuff. Counseled on appropriate blood pressure goals. Previously on ramipril and HCTZ so I have asked that they monitor closely for 2 weeks and we will check back to determine if anything else needs to be added back.  In the mean time watch salt in diet, contact me if they are noticing consistent readings > 140/90 before the two week period. No changes to medications at this time.   Hyperlipidemia/CAD: (LDL goal < 70) -Controlled -Current treatment: Rosuvastatin 20mg63mpropriate, Query effective, ,  Ezetimibe 10mg 77mopriate, Query effective, ,  -Medications previously tried: none noted -Educated on Cholesterol goals;  Benefits of statin for ASCVD risk reduction;  -Continues to be adherent' -Most recent lipid panel excellent, congratulations on this! -Recommended to continue current medication  Update 10/01/21 LDL in hospital was 84 - goal < 70. Patient is hight risk due to previous stroke. Currently on moderate intensity statin.  There is also the potential  of increased myopathy and rhabdomyolysis with amlodipine and simvastatin doses over 62m.  Based on that I would consider a switch to Crestor 248mdaily for better LDL lowering and no interaction with amlodipine.  Update 12/31/21 Did not see fill of Crestor in recent fill history. Patient's son states he  believes he is taking Crestor but will confirm when home around his medications. Recommend recheck lipids to make sure Ldl is moving towards goal < 70. Denies any adverse effects. FU after next OV for lipids.  Diabetes (A1c goal <7%) -Controlled -Current medications: Farxiga 16m58maily -Medications previously tried: metformin (headaches)  -Current home glucose readings fasting glucose: 100-115 most mornings post prandial glucose: none available -Denies hypoglycemic/hyperglycemic symptoms -Current meal patterns: reports a good balanced diet -Exercise - active with yard work for himself and neighbors -Educated on A1c and blood sugar goals; Benefits of routine self-monitoring of blood sugar; -Counseled to check feet daily and get yearly eye exams -Denies hypoglycemia since decreased dose of glimepiride, continue to monitor -Recommended to continue current medication  Update 10/01/21 170-227 have been the range of sugars since coming home from rehab, no episodes of hypoglycemia. Monitoring sugars at home? 220s mid day today D/c Jardiance? - He does not remember why he stopped this, there was also a mention of a trial of Farxiga, however he reports that cost was the barrier in him taking this. Would he consider Jardiance/Farxiga  if price was not an issue? Yes the only barrier he can remember is cost. We spent a lot of time reviewing diet and portion control.  Counseled on limiting carbs to one source per meal.   For cardiovascular and renal benefits, I would consider a switch to Farxiga 16mg78mily and d/c glimepiride.  Work on carbohydrate servings and excess sugars.   Future plans to introduce low dose metformin if glucose still not at goal. Of course this is only if we can get him approved for patient assistance program. FU in 2 weeks on glucose readings and PAP application.   Update 12/31/21 140-160 fasting glucose reported at home. He is not getting Farx[place]ough patient  assistance. Discussed fasting glucose goals - sugars are showing big improvement since last discussion. He is tolerating FarxIranl. Due to recheck A1c next month, future plans to add low dose metformin if A1c not near or below goal. No changes at this time - we are working to set him up with [facility] pill packaging.  Depression/Anxiety (Goal: Control symptoms) -Controlled -Current treatment: Fluoxetine 20mg52mnazepam 0.1216mg 38m-Medications previously tried/failed: none noted -Educated on Benefits of medication for symptom control  -Pt reports his mood has been great lately, no issues with sleep -Recommended to continue current medication  Update 10/01/21 Mentions vivid dreams at night, the son can hear him tossing and turning all night.  Mr. ShumatDittmarnot realize he has not slept well the next morning. Started ropinirole recently in hospital due to restless legs.  This can often cause vivid dreams and sleep disturbance especially at the beginning of treatment.  Have asked his son to trial him off of this for a few nights to see if this resolves the issue. Has FU with PCP tomorrow 12/16 - will discuss at that appt.  GERD (Goal: Minimize symptoms) -Controlled -Current treatment  Famotidine 20mg p316mMedications previously tried: none noted -No longer taking famotidine scheduled, just takes prn -Reports his symptoms are very rare -Recommended to continue current medication   Patient Goals/Self-Care Activities Over the next  180 days, patient will:  - take medications as prescribed check glucose daily, document, and provide at future appointments check blood pressure at least a few times per week, document, and provide at future appointments  Follow Up Plan: The care management team will reach out to the patient again over the next 90 days.

## 2022-06-30 ENCOUNTER — Ambulatory Visit: Payer: Medicare Other | Admitting: Occupational Therapy

## 2022-06-30 ENCOUNTER — Ambulatory Visit: Payer: Medicare Other | Admitting: Physical Therapy

## 2022-07-02 NOTE — Therapy (Signed)
OUTPATIENT PHYSICAL THERAPY TREATMENT NOTE   Patient Name: Barry [name] MRN: [id number] DOB:06-13-1933, 88 y.o., male Today's Date: 07/05/2022  PCP:  Jenna Luo, MD REFERRING PROVIDER: Penni Bombard, MD   END OF SESSION:   PT End of Session - 07/05/22 1441     Visit Number 6    Number of Visits 16    Date for PT Re-Evaluation 07/30/22    Authorization Type Medicare/[employer]    Progress Note Due on Visit 10    Equipment Utilized During Treatment Gait belt    Activity Tolerance Patient tolerated treatment well    Behavior During Therapy [facility] for tasks assessed/performed               Past Medical History:  Diagnosis Date   AAA (abdominal aortic aneurysm) ([place]) 12/17/2007   4.7 cm   Allergy    rhinitis   Anxiety    Cancer ([place])    skin   Cataract    Depression    Diabetes mellitus    Diverticulosis    GERD (gastroesophageal reflux disease)    Hepatitis    Hx of colonic polyps 08/26/2015   Hyperlipidemia    Hypertension    Nephrolithiasis    Personal history of colonic polyps    adenomas, serrated also   PVD (peripheral vascular disease) ([place])    Stroke ([place])    Tobacco abuse    Past Surgical History:  Procedure Laterality Date   COLONOSCOPY  2006, 2008, 2010, 05/27/2011   numerous adenomas - 13 in 2006, 3, 2008, 2 2012 (up to 1 cm), 4 diminutive adenomas, serrated adenomas 2012. Diverticulosis and hemorrhoids also.   EYE SURGERY     EYE SURGERY Left    Kidney stone operation     lower limb amputation, other toe 5th     percutaneous stent graft repair of infrarenal AAA  11/10   5.6 cm (T. early)   surgical excision basal cell carcinoma     TEE WITHOUT CARDIOVERSION N/A 07/29/2021   Procedure: TRANSESOPHAGEAL ECHOCARDIOGRAM (TEE);  Surgeon: Geralynn Rile, MD;  Location: [facility];  Service: Cardiovascular;  Laterality: N/A;   tympanic eardrum repair     VASECTOMY     Patient Active Problem List   Diagnosis Date Noted   Right  thalamic infarction ([place]) 07/31/2021   Stage 3a chronic kidney disease ([place])    Benign essential HTN    Left hemiparesis ([place])    Type 2 diabetes mellitus with hyperglycemia, without long-term current use of insulin ([place])    CVA (cerebral vascular accident) ([place]) 07/27/2021   Frequent PVCs 03/20/2020   Aortic stenosis 03/20/2020   COPD (chronic obstructive pulmonary disease) ([place]) 03/20/2020   Constipation 04/30/2019   Protein-calorie malnutrition ([place]) 04/30/2019   Anxiety with depression 12/05/2018   Seasonal and perennial allergic rhinitis 08/09/2018   Carotid artery disease ([place]) 11/18/2017   Hx of colonic polyps 08/26/2015   Erectile dysfunction 02/17/2015   AAA (abdominal aortic aneurysm) without rupture ([place]) 05/21/2014   Chest pain 01/31/2014   CAD (coronary artery disease) 12/24/2013   HEARING LOSS, CONDUCTIVE, LEFT 02/23/2010   DIZZINESS, CHRONIC 05/28/2008   PULMONARY NODULE 09/07/2007   Diabetes mellitus type II, controlled ([place]) 09/06/2007   Hyperlipidemia 09/06/2007   TOBACCO ABUSE 09/06/2007   Essential hypertension 09/06/2007   Allergic rhinitis 09/06/2007   GERD 09/06/2007   BASAL CELL CARCINOMA, NOSE 09/06/2007    REFERRING DIAG:  K74.259 (ICD-10-CM) - Cerebrovascular accident (CVA) due to  bilateral embolism of posterior cerebral arteries (HCC)  G81.94 (ICD-10-CM) - Left hemiparesis (HCC)    THERAPY DIAG:  Unsteadiness on feet  Muscle weakness (generalized)  Other abnormalities of gait and mobility  Rationale for Evaluation and Treatment Rehabilitation  PERTINENT HISTORY: PMH:  AAA, anxiety, depression, skin cancer, DM, GERD, HLD, HTN, CVA, CAD, COPD  PRECAUTIONS:  Fall  SUBJECTIVE: I feel like it's medicine making me sleepy. Still feeling dizzy when standing. Wearing knee brace today and not sure if he should wear it.   PAIN:  Are you having pain? NO: NPRS scale: 5/10 Pain location: headache-frontal Pain description: headache Aggravating  factors: unsure Relieving factors: unsure   OBJECTIVE:     TODAY'S TREATMENT: 07/05/22 Activity Comments  Vitals  165/76 mmHg, 56 bpm, 97% spO2  STS in II bars 2x Pulling up using bars   Attempted standing in II bars Limited by c/o wooziness   LAQ with 3# 10x each C/o difficulty on L LE; Limited eccentric control   Vitals  164/79 mmHg, 60bpm  Sitting march with 3# 20x2 Limited eccentric control   Nustep L4 x 7 min Ues/Les    Sitting red TB row 2x10 Cues for reduced pace and scap retraction   Sitting red TB extension x10 Cues for speed        PATIENT EDUCATION: Education details: encouraged patient to f/u with PCP about lightheadedness and orthostasis seen in previous sessions  Person educated: Patient and Child(ren) (son) Education method: Explanation Education comprehension: verbalized understanding   Access Code: [email or web address] URL: [email or web address] Date: 06/16/2022-most recent additions Prepared by: [facility]  Exercises - Sit to Stand with Armchair  - 1-2 x daily - 7 x weekly - 1 sets - 5 reps - 3 sec hold - Seated Hip Abduction with Resistance  - 1 x daily - 5 x weekly - 2 sets - 10 reps - Seated Ankle Dorsiflexion with Resistance  - 1 x daily - 5 x weekly - 2 sets - 10 reps - Side to side weightshift  - 1 x daily - 5 x weekly - 1-2 sets - 10 reps    ---------------------------------------------------------------------------------- (objective measures completed at initial evaluation unless otherwise dated)   DIAGNOSTIC FINDINGS: [facility] 07/27/[facility] to [facility] with acute onset of left-sided weakness.  CT/MRI showed acute right posterior lentiform nucleus, thalamus, anterior caudate infarct with additional punctate infarct in the lateral left thalamus and possible the left lentiform.  CT angiogram head and neck with mild atherosclerotic plaque of the bilateral carotid bulbs resulting in 30 to 40%  stenosis on the right.  1 cm enhancing lesion in the right parotid gland indeterminate recommend ENT follow-up as outpatient.  MRA of the head showed no significant proximal stenosis aneurysm or occlusion.  Echocardiogram with ejection fraction of 60 to 44% grade 1 diastolic dysfunction.  TEE showed no left atrial appendage or thrombus detected.  No PFO.  (Per 07/2021 notes)   COGNITION: Overall cognitive status: Within functional limits for tasks assessed             SENSATION: Light touch: WFL Proprioception: WFL   COORDINATION: Decreased coordination, slowed movement pattern, LLE     MUSCLE TONE: LLE: Moderate     POSTURE: rounded shoulders and forward head   LOWER EXTREMITY ROM:      Active  Right Eval Left Eval  Hip flexion      Hip extension      Hip abduction  Hip adduction      Hip internal rotation      Hip external rotation      Knee flexion      Knee extension   Decreased full AROM (-24 degrees); passive -5 degrees  Ankle dorsiflexion      Ankle plantarflexion      Ankle inversion      Ankle eversion       (Blank rows = not tested)   LOWER EXTREMITY MMT:     MMT Right Eval Left Eval  Hip flexion 5 4  Hip extension      Hip abduction 5 4  Hip adduction 5 4  Hip internal rotation      Hip external rotation      Knee flexion 5 3+  Knee extension 5 3-  Ankle dorsiflexion 4 3+  Ankle plantarflexion      Ankle inversion   3+  Ankle eversion   3+  (Blank rows = not tested)     TRANSFERS: Assistive device utilized: Environmental consultant - 2 wheeled  Sit to stand: Min A Stand to sit: Min A LUE at walker, RUE pushes up from transport chair.  At times, he attempts to push up with BUEs on walker; he sits, keeping BUEs at walker.  Cues provided for correct hand placement for improved sit<>stand technique   GAIT: Gait pattern: step through pattern, decreased step length- Left, decreased ankle dorsiflexion- Left, knee flexed in stance- Right, knee flexed in stance-  Left, scissoring, narrow BOS, and poor foot clearance- Left Distance walked: 40 ft Assistive device utilized: Environmental consultant - 2 wheeled Level of assistance: Min A Comments: 32.94 sec in 10 meter walk:  0.99 ft/sec   FUNCTIONAL TESTs:  5 times sit to stand: 27.97 sec with BUE support Timed up and go (TUG): 39.12 sec with RW Berg Balance Scale: 14/56 (Scores <45/56 indicate increased fall risk   PATIENT SURVEYS:  FOTO NA due to CVA >6 months ago   TODAY'S TREATMENT:  Sit<>stand from elevated mat height, simulating bed, x 5 reps, with cues for hand placement, foot placement; cues upon standing for glut/quad activation, upright posture.  HEP initiated-see below     PATIENT EDUCATION: Education details: Eval results, POC; initiated HEP-hand placement, safety awareness with sit<>Stand transfers Person educated: Patient Education method: Explanation, Demonstration, Tactile cues, Verbal cues, and Handouts Education comprehension: verbalized understanding, returned demonstration, verbal cues required, and needs further education     HOME EXERCISE PROGRAM: Access Code: [email or web address] URL: [email or web address] Date: 06/07/2022 Prepared by: [facility]   Exercises - Sit to Stand with Armchair  - 1-2 x daily - 7 x weekly - 1 sets - 5 reps - 3 sec hold   -------------------------------------------------------------------------------------------    GOALS: Goals reviewed with patient? Yes   SHORT TERM GOALS: Target date: 07/02/2022   Pt will be supervision with HEP for improved strength, balance, gait. Baseline: Goal status: MET   2.  Pt will improve 5x sit<>stand to less than or equal to 20 sec to demonstrate improved functional strength and transfer efficiency.  Baseline: 27.97 sec with definite UE support; 07/05/22 26.24 sec with B armrest support Goal status: IN PROGRESS   3.  Pt will improve Berg score to at least 22/56 to decrease fall  risk. Baseline: 14/56 Goal status: INITIAL     LONG TERM GOALS: Target date: 07/30/2022   Pt will be independent with HEP for improved strength, balance, transfers, and gait. Baseline:  Goal status: INITIAL   2.  Pt will improve 5x sit<>stand to less than or equal to 15 sec to demonstrate improved functional strength and transfer efficiency. Baseline: 27.97 sec BUE support at eval Goal status: INITIAL   3.  Pt will improve TUG score to less than or equal to 20 sec for decreased fall risk. Baseline: 39.12 sec with RW Goal status: INITIAL   4.  Pt will improve gait velocity to at least 1.8 ft/sec for improved gait efficiency and safety. Baseline: 0.99 ft/sec Goal status: INITIAL   5.  Pt will improve Berg score to at least 32/56 to decrease fall risk. Baseline: 14/56 Goal status: INITIAL   ASSESSMENT:   CLINICAL IMPRESSION: Patient arrived to session with report of continued dizziness upon standing during previous OT session. Vitals at start of session revealed elevated systolic BP and slight bradycardia. Patient was able to complete 5xSTS in 24 sec, indicating increased risk of falls. Unable to test Berg today d/t c/o wooziness upon standing. Worked on seated LE ther-ex with limited eccentric control and more difficulty evident on L >R side. Able to tolerate remainder of session well. Patient and son reported understanding of recommendation to f/u with PCP about c/o dizziness.    OBJECTIVE IMPAIRMENTS Abnormal gait, decreased balance, decreased coordination, decreased mobility, difficulty walking, decreased ROM, decreased strength, decreased safety awareness, dizziness, impaired flexibility, impaired tone, and postural dysfunction.    ACTIVITY LIMITATIONS sitting, standing, stairs, transfers, and locomotion level   PARTICIPATION LIMITATIONS: driving, community activity, and living independently   [place] Time since onset of injury/illness/exacerbation and 3+  comorbidities: see above  are also affecting patient's functional outcome.    REHAB POTENTIAL: Good   CLINICAL DECISION MAKING: Evolving/moderate complexity   EVALUATION COMPLEXITY: Moderate   PLAN: PT FREQUENCY: 2x/week   PT DURATION: 8 weeks, including eval week   PLANNED INTERVENTIONS: Therapeutic exercises, Therapeutic activity, Neuromuscular re-education, Balance training, Gait training, Patient/Family education, Self Care, Stair training, Vestibular training, [place] repositioning, DME instructions, and Manual therapy   PLAN FOR NEXT SESSION: Check STGs - berg review additions to HEP to address standing and weightshifting-continue work on gluts, hamstrings, quads; work on [place], weightbearing through LLE with standing, transfers, wider BOS with gait   Janene Harvey, PT, DPT 07/05/22 2:43 PM  [place] [facility] [address] [place], [place] [address] Phone # [phone] Fax # [phone]

## 2022-07-05 ENCOUNTER — Encounter: Payer: Self-pay | Admitting: Physical Therapy

## 2022-07-05 ENCOUNTER — Ambulatory Visit: Payer: Medicare Other | Admitting: Physical Therapy

## 2022-07-05 ENCOUNTER — Ambulatory Visit: Payer: Medicare Other | Admitting: Occupational Therapy

## 2022-07-05 ENCOUNTER — Telehealth: Payer: Self-pay

## 2022-07-05 VITALS — BP 151/82 | HR 66

## 2022-07-05 DIAGNOSIS — R278 Other lack of coordination: Secondary | ICD-10-CM | POA: Diagnosis not present

## 2022-07-05 DIAGNOSIS — I69354 Hemiplegia and hemiparesis following cerebral infarction affecting left non-dominant side: Secondary | ICD-10-CM

## 2022-07-05 DIAGNOSIS — M6281 Muscle weakness (generalized): Secondary | ICD-10-CM | POA: Diagnosis not present

## 2022-07-05 DIAGNOSIS — R2689 Other abnormalities of gait and mobility: Secondary | ICD-10-CM | POA: Diagnosis not present

## 2022-07-05 DIAGNOSIS — R2681 Unsteadiness on feet: Secondary | ICD-10-CM | POA: Diagnosis not present

## 2022-07-05 DIAGNOSIS — R293 Abnormal posture: Secondary | ICD-10-CM | POA: Diagnosis not present

## 2022-07-05 NOTE — Therapy (Signed)
OUTPATIENT OCCUPATIONAL THERAPY NEURO  Treatment Session  Patient Name: Barry [name] MRN: [id number] DOB:04-29-1933, 88 y.o., male Today's Date: 07/05/2022  PCP: Susy Frizzle, MD REFERRING PROVIDER: Penni Bombard, MD    OT End of Session - 07/05/22 1324     Visit Number 5    Number of Visits 17    Date for OT Re-Evaluation 08/06/22    Authorization Type Medicare A &B; [employer] (secondary)    OT Start Time 1318    OT Stop Time 1400    OT Time Calculation (min) 42 min    Activity Tolerance Patient tolerated treatment well    Behavior During Therapy [facility] for tasks assessed/performed                 Past Medical History:  Diagnosis Date   AAA (abdominal aortic aneurysm) ([place]) 12/17/2007   4.7 cm   Allergy    rhinitis   Anxiety    Cancer ([place])    skin   Cataract    Depression    Diabetes mellitus    Diverticulosis    GERD (gastroesophageal reflux disease)    Hepatitis    Hx of colonic polyps 08/26/2015   Hyperlipidemia    Hypertension    Nephrolithiasis    Personal history of colonic polyps    adenomas, serrated also   PVD (peripheral vascular disease) ([place])    Stroke ([place])    Tobacco abuse    Past Surgical History:  Procedure Laterality Date   COLONOSCOPY  2006, 2008, 2010, 05/27/2011   numerous adenomas - 13 in 2006, 3, 2008, 2 2012 (up to 1 cm), 4 diminutive adenomas, serrated adenomas 2012. Diverticulosis and hemorrhoids also.   EYE SURGERY     EYE SURGERY Left    Kidney stone operation     lower limb amputation, other toe 5th     percutaneous stent graft repair of infrarenal AAA  11/10   5.6 cm (T. early)   surgical excision basal cell carcinoma     TEE WITHOUT CARDIOVERSION N/A 07/29/2021   Procedure: TRANSESOPHAGEAL ECHOCARDIOGRAM (TEE);  Surgeon: Geralynn Rile, MD;  Location: [facility];  Service: Cardiovascular;  Laterality: N/A;   tympanic eardrum repair     VASECTOMY     Patient Active Problem List   Diagnosis  Date Noted   Right thalamic infarction ([place]) 07/31/2021   Stage 3a chronic kidney disease ([place])    Benign essential HTN    Left hemiparesis ([place])    Type 2 diabetes mellitus with hyperglycemia, without long-term current use of insulin ([place])    CVA (cerebral vascular accident) ([place]) 07/27/2021   Frequent PVCs 03/20/2020   Aortic stenosis 03/20/2020   COPD (chronic obstructive pulmonary disease) ([place]) 03/20/2020   Constipation 04/30/2019   Protein-calorie malnutrition ([place]) 04/30/2019   Anxiety with depression 12/05/2018   Seasonal and perennial allergic rhinitis 08/09/2018   Carotid artery disease ([place]) 11/18/2017   Hx of colonic polyps 08/26/2015   Erectile dysfunction 02/17/2015   AAA (abdominal aortic aneurysm) without rupture ([place]) 05/21/2014   Chest pain 01/31/2014   CAD (coronary artery disease) 12/24/2013   HEARING LOSS, CONDUCTIVE, LEFT 02/23/2010   DIZZINESS, CHRONIC 05/28/2008   PULMONARY NODULE 09/07/2007   Diabetes mellitus type II, controlled ([place]) 09/06/2007   Hyperlipidemia 09/06/2007   TOBACCO ABUSE 09/06/2007   Essential hypertension 09/06/2007   Allergic rhinitis 09/06/2007   GERD 09/06/2007   BASAL CELL CARCINOMA, NOSE 09/06/2007    ONSET DATE: 07/27/2021  REFERRING DIAG: V56.433 (ICD-10-CM) - Cerebrovascular accident (CVA) due to bilateral embolism of posterior cerebral arteries (HCC) G81.94 (ICD-10-CM) - Left hemiparesis   THERAPY DIAG:  Hemiplegia and hemiparesis following cerebral infarction affecting left non-dominant side (HCC)  Other lack of coordination  Muscle weakness (generalized)  Unsteadiness on feet  Rationale for Evaluation and Treatment Rehabilitation  SUBJECTIVE:   SUBJECTIVE STATEMENT: Pt reports the days are long as he does not feel that he can do the things he was doing, and he used to be always doing something.  Pt accompanied by: self and family member (son remained in waiting room)  PERTINENT HISTORY: CAD, COPD, type 2  diabetes, hypertension, GERD, allergic rhinitis, and protein calorie malnutrition   PRECAUTIONS: Fall  WEIGHT BEARING RESTRICTIONS No  PAIN:  Are you having pain? No.   FALLS: Has patient fallen in last 6 months? No  LIVING ENVIRONMENT: Lives with: lives with their family and was living independently prior to CVA but is now living with son and his wife Lives in: House/apartment Stairs: Yes: Internal: full flight to go upstairs, he uses a stair lift to get upstairs for shower, but otherwise remains on main floor steps; and External: 2 steps at front and 6 steps at back steps; uses RW to get down steps Has following equipment at home: Gilford Rile - 2 wheeled, Wheelchair (manual), Shower bench, bed side commode, Grab bars, and transport chair  PLOF: Independent, Independent with homemaking with ambulation, and Independent with gait  PATIENT GOALS to be able to walk again  OBJECTIVE:   HAND DOMINANCE: Right  ADLs: Transfers/ambulation related to ADLs: Mod I toilet transfers from w/c, Supervision shower transfers UB Dressing: Mod I, difficulty with managing buttons LB Dressing: Mod I from sit > stand level, decreased coordination with tying shoes Bathing: Min-mod assist for bathing Tub Shower transfers: Supervision for stand pivot transfer w/c <> shower chair/bench Equipment: Transfer tub bench, Grab bars, Walk in shower, and bed side commode   IADLs: Light housekeeping: family is performing majority of these tasks, but reports that he feels he could do some of these things Meal Prep: not currently performing, family may leave prepared food for him if they are out Community mobility: was driving prior to stroke but has not returned to driving Medication management: Son is providing assist   UPPER EXTREMITY ROM     Active ROM Right eval Left eval  Shoulder flexion 120 108  Shoulder abduction    Shoulder adduction    Shoulder extension    Shoulder internal rotation [facility]   Shoulder external rotation [facility]  Elbow flexion [facility] WFL  Elbow extension [facility] WFL  Wrist flexion    Wrist extension    Wrist ulnar deviation    Wrist radial deviation    Wrist pronation    Wrist supination    (Blank rows = not tested)   UPPER EXTREMITY MMT:     MMT Right eval Left eval  Shoulder flexion 5/5 4/5  Shoulder abduction    Shoulder adduction    Shoulder extension    Shoulder internal rotation    Shoulder external rotation    Middle trapezius    Lower trapezius    Elbow flexion  4/5  Elbow extension  4/5  Wrist flexion    Wrist extension    Wrist ulnar deviation    Wrist radial deviation    Wrist pronation    Wrist supination    (Blank rows = not tested)  COORDINATION: Finger Nose  Finger test: mild to moderate dysmetria on L 9 Hole Peg test: Right: 36.13 sec; Left: 51.91 sec 06/28/22: Left: 45.09 sec Box and Blocks:  Right 43 blocks, Left 34 blocks 06/28/22: Left 33 blocks Rapid hand alternating movements: pt with decreased speed and coordination on L  COGNITION: Overall cognitive status:  reports some loss of memory of instances from before the stroke, reports difficulty with dates on important information from prior to stroke.  Reports no issues with STM or current recall of information.  Will need to be further assessed during next session and during functional tasks  VISION: Subjective report: no changes Baseline vision: Wears glasses for reading only  VISION ASSESSMENT: To be further assessed in functional context  Patient has difficulty with following activities due to following visual impairments: IP rehab noted some L inattention, pt does not report any during eval.  Will need to be further assessed in functional context -------------------------------------------------------------------------------------------------------------------------------------------------------- (objective measures above completed at initial evaluation unless  otherwise dated)   TODAY'S TREATMENT:  Pipe tree puzzle: engaged in BUE coordination task in sitting progressing to standing to focus on motor control, functional reach, and balance.  Pt continues to report dizziness and instability in standing requiring min assist initially for pt to gain increased standing balance/posture.  Therapist able to fade from min - CGA throughout session as pt demonstrating mild increase in stability with increased upright standing. Pt tolerated standing 1-3 mins each attempt with min - CGA due to instability and reports of dizziness.  Noted mild impairments with picking up and manipulating PVC pieces into pattern. Self-care: educated on activity tolerance and current decrease in tolerance due to decreased expenditure.  Engaged in discussion about pt's medical concerns regarding his L shoulder pain (pt frequently mentions that he thinks UE issues are more from injury than CVA) as well as his persistent dizziness/instability with standing tasks. Vitals assessed in sitting, after standing 1 min and after standing 3 mins.  See below for measurements.    Vitals:   07/05/22 1335 07/05/22 1343 07/05/22 1344  BP: (!) 162/77 (!) 154/79 (!) 151/82  Pulse: (!) 58 63 66      PATIENT EDUCATION: Education details: Educated on fine motor control tasks and provided with HEP. Person educated: Patient Education method: Explanation, Verbal cues, and Handouts Education comprehension: verbalized understanding and needs further education   HOME EXERCISE PROGRAM: Fine motor control HEP    GOALS: Goals reviewed with patient? Yes  SHORT TERM GOALS: Target date: 07/09/2022  Pt will be independent with FMC/GMC HEP to increase independence with ADLs and IADLs. Baseline: Goal status: IN PROGRESS  2.  Pt will verbalize understanding of task modifications, adaptive strategies, and/or potential AE needs to increase ease, safety, and independence w/ ADLs and IADLs. Baseline:  Goal  status: IN PROGRESS  3.  Pt will demonstrate improved fine motor coordination for ADLs (buttons, tying shoes) as evidenced by decreasing 9 hole peg test score for LUE by 5 secs Baseline: 9 Hole Peg test: Right: 36.13 sec; Left: 51.91 sec Goal status: MET - Left: 45.09 sec on 06/28/22   LONG TERM GOALS: Target date: 08/06/2022  Pt will perform dynamic standing task for simple IADLs w/o LOB using DME and/or countertop support prn. Baseline:  Goal status: IN PROGRESS  2.  Pt will demonstrate improved fine motor coordination for ADLs as evidenced by decreasing 9 hole peg test score for LUE by 10 secs Baseline: 9 Hole Peg test: Right: 36.13 sec; Left: 51.91 sec Goal  status: REVISED increased from 8 sec improvement to 10 sec improvement based on progress as of 06/28/22  3.  Pt will demonstrate improved UE functional use for ADLs as evidenced by increasing box/ blocks score by 4 blocks with LUE Baseline: Box and Blocks:  Right 43 blocks, Left 34 blocks Goal status: IN PROGRESS  4.  Pt will demonstrate ability to safely complete functional bilateral IADL task (e.g., folding clothes, making a sandwich, microwave use, making the bed) w/ Mod I Baseline:  Goal status: IN PROGRESS  5. Pt will demonstrate awareness of return to driving recommendations.  Baseline:  Goal status: IN PROGRESS   ASSESSMENT:  CLINICAL IMPRESSION: Pt participation is limited by lightheadedness/dizziness and need to assess vitals during session.  Pt also continues to voice uncertainty that his impairments on LUE are from previous shoulder injury or from CVA - therapist recommends pt get shoulder checked out since he does have h/o injury.  Pt continues to demonstrate mild impairments with fine and gross motor tasks with LUE.  PERFORMANCE DEFICITS in functional skills including ADLs, IADLs, coordination, dexterity, ROM, strength, pain, FMC, GMC, mobility, balance, body mechanics, endurance, decreased knowledge of  precautions, decreased knowledge of use of DME, and UE functional use, cognitive skills including memory, and psychosocial skills including environmental adaptation.   IMPAIRMENTS are limiting patient from ADLs and IADLs.   COMORBIDITIES may have co-morbidities  that affects occupational performance. Patient will benefit from skilled OT to address above impairments and improve overall function.  MODIFICATION OR ASSISTANCE TO COMPLETE EVALUATION: Min-Moderate modification of tasks or assist with assess necessary to complete an evaluation.  OT OCCUPATIONAL PROFILE AND HISTORY: Detailed assessment: Review of records and additional review of physical, cognitive, psychosocial history related to current functional performance.  CLINICAL DECISION MAKING: LOW - limited treatment options, no task modification necessary  REHAB POTENTIAL: Good  EVALUATION COMPLEXITY: Low    PLAN: OT FREQUENCY: 1-2x/week  OT DURATION: 8 weeks  PLANNED INTERVENTIONS: self care/ADL training, therapeutic exercise, therapeutic activity, neuromuscular re-education, passive range of motion, balance training, functional mobility training, electrical stimulation, ultrasound, moist heat, cryotherapy, patient/family education, cognitive remediation/compensation, energy conservation, coping strategies training, and DME and/or AE instructions  RECOMMENDED OTHER SERVICES: NA  CONSULTED AND AGREED WITH PLAN OF CARE: Patient and family member/caregiver  PLAN FOR NEXT SESSION: Review coordination HEP, engage in BUE functional tasks, transfers, functional reach and standing balance   Jozette Castrellon, [place], OTR/L 07/05/2022, 1:24 PM

## 2022-07-08 ENCOUNTER — Ambulatory Visit: Payer: Medicare Other | Admitting: Physical Therapy

## 2022-07-08 ENCOUNTER — Ambulatory Visit: Payer: Medicare Other | Admitting: Occupational Therapy

## 2022-07-08 DIAGNOSIS — R2689 Other abnormalities of gait and mobility: Secondary | ICD-10-CM | POA: Diagnosis not present

## 2022-07-08 DIAGNOSIS — I69354 Hemiplegia and hemiparesis following cerebral infarction affecting left non-dominant side: Secondary | ICD-10-CM | POA: Diagnosis not present

## 2022-07-08 DIAGNOSIS — M6281 Muscle weakness (generalized): Secondary | ICD-10-CM

## 2022-07-08 DIAGNOSIS — R2681 Unsteadiness on feet: Secondary | ICD-10-CM

## 2022-07-08 DIAGNOSIS — R293 Abnormal posture: Secondary | ICD-10-CM | POA: Diagnosis not present

## 2022-07-08 DIAGNOSIS — R278 Other lack of coordination: Secondary | ICD-10-CM

## 2022-07-08 NOTE — Therapy (Signed)
OUTPATIENT PHYSICAL THERAPY TREATMENT NOTE   Patient Name: Barry [name] MRN: [id number] DOB:[birth year], 88 y.o., male Today's Date: 07/08/2022  PCP:  Jenna Luo, MD REFERRING PROVIDER: Penni Bombard, MD   END OF SESSION:   PT End of Session - 07/08/22 1559     Visit Number 7    Number of Visits 16    Date for PT Re-Evaluation 07/30/22    Authorization Type Medicare/[employer]    Progress Note Due on Visit 10    PT Start Time 6226    PT Stop Time 1539    PT Time Calculation (min) 47 min    Equipment Utilized During Treatment Gait belt    Activity Tolerance Patient tolerated treatment well;Patient limited by pain    Behavior During Therapy [facility] for tasks assessed/performed                Past Medical History:  Diagnosis Date   AAA (abdominal aortic aneurysm) ([place]) 12/17/2007   4.7 cm   Allergy    rhinitis   Anxiety    Cancer ([place])    skin   Cataract    Depression    Diabetes mellitus    Diverticulosis    GERD (gastroesophageal reflux disease)    Hepatitis    Hx of colonic polyps 08/26/2015   Hyperlipidemia    Hypertension    Nephrolithiasis    Personal history of colonic polyps    adenomas, serrated also   PVD (peripheral vascular disease) ([place])    Stroke ([place])    Tobacco abuse    Past Surgical History:  Procedure Laterality Date   COLONOSCOPY  2006, 2008, 2010, 05/27/2011   numerous adenomas - 13 in 2006, 3, 2008, 2 2012 (up to 1 cm), 4 diminutive adenomas, serrated adenomas 2012. Diverticulosis and hemorrhoids also.   EYE SURGERY     EYE SURGERY Left    Kidney stone operation     lower limb amputation, other toe 5th     percutaneous stent graft repair of infrarenal AAA  11/10   5.6 cm (T. early)   surgical excision basal cell carcinoma     TEE WITHOUT CARDIOVERSION N/A 07/29/2021   Procedure: TRANSESOPHAGEAL ECHOCARDIOGRAM (TEE);  Surgeon: Geralynn Rile, MD;  Location: [facility];  Service: Cardiovascular;  Laterality: N/A;    tympanic eardrum repair     VASECTOMY     Patient Active Problem List   Diagnosis Date Noted   Right thalamic infarction ([place]) 07/31/2021   Stage 3a chronic kidney disease ([place])    Benign essential HTN    Left hemiparesis ([place])    Type 2 diabetes mellitus with hyperglycemia, without long-term current use of insulin ([place])    CVA (cerebral vascular accident) ([place]) 07/27/2021   Frequent PVCs 03/20/2020   Aortic stenosis 03/20/2020   COPD (chronic obstructive pulmonary disease) ([place]) 03/20/2020   Constipation 04/30/2019   Protein-calorie malnutrition ([place]) 04/30/2019   Anxiety with depression 12/05/2018   Seasonal and perennial allergic rhinitis 08/09/2018   Carotid artery disease ([place]) 11/18/2017   Hx of colonic polyps 08/26/2015   Erectile dysfunction 02/17/2015   AAA (abdominal aortic aneurysm) without rupture ([place]) 05/21/2014   Chest pain 01/31/2014   CAD (coronary artery disease) 12/24/2013   HEARING LOSS, CONDUCTIVE, LEFT 02/23/2010   DIZZINESS, CHRONIC 05/28/2008   PULMONARY NODULE 09/07/2007   Diabetes mellitus type II, controlled ([place]) 09/06/2007   Hyperlipidemia 09/06/2007   TOBACCO ABUSE 09/06/2007   Essential hypertension 09/06/2007   Allergic  rhinitis 09/06/2007   GERD 09/06/2007   BASAL CELL CARCINOMA, NOSE 09/06/2007    REFERRING DIAG:  N00.370 (ICD-10-CM) - Cerebrovascular accident (CVA) due to bilateral embolism of posterior cerebral arteries (HCC)  G81.94 (ICD-10-CM) - Left hemiparesis ([place])    THERAPY DIAG:  Muscle weakness (generalized)  Unsteadiness on feet  Other abnormalities of gait and mobility  Rationale for Evaluation and Treatment Rehabilitation  PERTINENT HISTORY: PMH:  AAA, anxiety, depression, skin cancer, DM, GERD, HLD, HTN, CVA, CAD, COPD  PRECAUTIONS:  Fall  SUBJECTIVE: Knee buckled with walking on walker yesterday, caught myself.  Son states this happens often, and yesterday was not a new occurrence.  PAIN:  Are you having  pain? No pain when asked specifically.  He reports pain upon standing, but does not specifically give number.  OBJECTIVE:   Brief assessment of L knee.  Pt reports L knee buckling "giving way below knee" yesterday.  No pain to palpation along L patella, minimal c/o pain inferior and lateral to L knee.  Edema noted BLEs, L>R at ankles.  No c/o pain at ankles.    TODAY'S TREATMENT: 07/08/2022 Activity Comments  Berg Balance test:  18/56 Improved from 14/56 at eval  Gait 20 ft x 2 reps with RW, pt wearing knee brace, bilateral knees flexed, min assist Cues for quad/glut activation LLE to improve stability with LLE as stance leg  TherEx: Bridging 2 x 10 reps  Hooklying hip abduction, LLE 2 x 10 reps, red theraband SAQ 2 x 10 reps, 2# weight LLE Cues for technique, hold time            [facility] PT Assessment - 07/08/22 0001       Standardized Balance Assessment   Standardized Balance Assessment Berg Balance Test;Five Times Sit to Stand      Berg Balance Test   Sit to Stand Able to stand  independently using hands    Standing Unsupported Able to stand 2 minutes with supervision    Sitting with Back Unsupported but Feet Supported on Floor or Stool Able to sit safely and securely 2 minutes    Stand to Sit Controls descent by using hands    Transfers Able to transfer with verbal cueing and /or supervision    Standing Unsupported with Eyes Closed Needs help to keep from falling    Standing Unsupported with Feet Together Needs help to attain position but able to stand for 30 seconds with feet together    From Standing, Reach Forward with Outstretched Arm Loses balance while trying/requires external support    From Standing Position, Pick up Object from Floor Unable to try/needs assist to keep balance    From Standing Position, Turn to Look Behind Over each Shoulder Needs supervision when turning    Turn 360 Degrees Needs assistance while turning    Standing Unsupported, Alternately Place Feet on  Step/Stool Needs assistance to keep from falling or unable to try    Standing Unsupported, One Foot in Front Needs help to step but can hold 15 seconds    Standing on One Leg Unable to try or needs assist to prevent fall    Total Score 18            Access Code: [email or web address] URL: [email or web address] Date: 07/08/2022 Prepared by: [facility]  Exercises - Sit to Stand with Armchair  - 1-2 x daily - 7 x weekly - 1 sets - 5 reps - 3 sec hold -  Seated Hip Abduction with Resistance  - 1 x daily - 5 x weekly - 2 sets - 10 reps - Seated Ankle Dorsiflexion with Resistance  - 1 x daily - 5 x weekly - 2 sets - 10 reps - Side to side weightshift  - 1 x daily - 5 x weekly - 1-2 sets - 10 reps - Supine Bridge  - 1 x daily - 5 x weekly - 2 sets - 10 reps - 3 sec hold - Hooklying Clamshell with Resistance  - 1 x daily - 5 x weekly - 1-2 sets - 10 reps - Short Arc Quad with Ankle Weight  - 1 x daily - 5 x weekly - 1-2 sets - 10 reps   PATIENT EDUCATION: Education details: HEP updates; progress towards goals; knee pain, dizziness (trying to discern unsteadiness, instability through hips/BLEs in standing versus actual vertigo) as limiting factors; discussed with patient and son (and OT) possibility of transfer to [employer] as that location is closer to [employer] Person educated: Patient and Child(ren) Education method: Consulting civil engineer, Demonstration, Verbal cues, and Handouts Education comprehension: verbalized understanding, returned demonstration, and needs further education    ---------------------------------------------------------------------------------- (objective measures completed at initial evaluation unless otherwise dated)   DIAGNOSTIC FINDINGS: Presented 07/27/20[facility] to [facility] with acute onset of left-sided weakness.  CT/MRI showed acute right posterior lentiform nucleus, thalamus, anterior caudate infarct with  additional punctate infarct in the lateral left thalamus and possible the left lentiform.  CT angiogram head and neck with mild atherosclerotic plaque of the bilateral carotid bulbs resulting in 30 to 40% stenosis on the right.  1 cm enhancing lesion in the right parotid gland indeterminate recommend ENT follow-up as outpatient.  MRA of the head showed no significant proximal stenosis aneurysm or occlusion.  Echocardiogram with ejection fraction of 60 to 16% grade 1 diastolic dysfunction.  TEE showed no left atrial appendage or thrombus detected.  No PFO.  (Per 07/2021 notes)   COGNITION: Overall cognitive status: Within functional limits for tasks assessed             SENSATION: Light touch: WFL Proprioception: WFL   COORDINATION: Decreased coordination, slowed movement pattern, LLE     MUSCLE TONE: LLE: Moderate     POSTURE: rounded shoulders and forward head   LOWER EXTREMITY ROM:      Active  Right Eval Left Eval  Hip flexion      Hip extension      Hip abduction      Hip adduction      Hip internal rotation      Hip external rotation      Knee flexion      Knee extension   Decreased full AROM (-24 degrees); passive -5 degrees  Ankle dorsiflexion      Ankle plantarflexion      Ankle inversion      Ankle eversion       (Blank rows = not tested)   LOWER EXTREMITY MMT:     MMT Right Eval Left Eval  Hip flexion 5 4  Hip extension      Hip abduction 5 4  Hip adduction 5 4  Hip internal rotation      Hip external rotation      Knee flexion 5 3+  Knee extension 5 3-  Ankle dorsiflexion 4 3+  Ankle plantarflexion      Ankle inversion   3+  Ankle eversion   3+  (Blank rows = not  tested)     TRANSFERS: Assistive device utilized: Environmental consultant - 2 wheeled  Sit to stand: Min A Stand to sit: Min A LUE at walker, RUE pushes up from transport chair.  At times, he attempts to push up with BUEs on walker; he sits, keeping BUEs at walker.  Cues provided for correct hand  placement for improved sit<>stand technique   GAIT: Gait pattern: step through pattern, decreased step length- Left, decreased ankle dorsiflexion- Left, knee flexed in stance- Right, knee flexed in stance- Left, scissoring, narrow BOS, and poor foot clearance- Left Distance walked: 40 ft Assistive device utilized: Environmental consultant - 2 wheeled Level of assistance: Min A Comments: 32.94 sec in 10 meter walk:  0.99 ft/sec   FUNCTIONAL TESTs:  5 times sit to stand: 27.97 sec with BUE support Timed up and go (TUG): 39.12 sec with RW Berg Balance Scale: 14/56 (Scores <45/56 indicate increased fall risk   PATIENT SURVEYS:  FOTO NA due to CVA >6 months ago   TODAY'S TREATMENT:  Sit<>stand from elevated mat height, simulating bed, x 5 reps, with cues for hand placement, foot placement; cues upon standing for glut/quad activation, upright posture.  HEP initiated-see below     PATIENT EDUCATION: Education details: Eval results, POC; initiated HEP-hand placement, safety awareness with sit<>Stand transfers Person educated: Patient Education method: Explanation, Demonstration, Tactile cues, Verbal cues, and Handouts Education comprehension: verbalized understanding, returned demonstration, verbal cues required, and needs further education     -------------------------------------------------------------------------------------------    GOALS: Goals reviewed with patient? Yes   SHORT TERM GOALS: Target date: 07/02/2022   Pt will be supervision with HEP for improved strength, balance, gait. Baseline: Goal status: MET   2.  Pt will improve 5x sit<>stand to less than or equal to 20 sec to demonstrate improved functional strength and transfer efficiency.  Baseline: 27.97 sec with definite UE support; 07/05/22 26.24 sec with B armrest support Goal status: NOT MET   3.  Pt will improve Berg score to at least 22/56 to decrease fall risk. Baseline: 14/56>18/56 07/08/2022 Goal status: NOT MET     LONG  TERM GOALS: Target date: 07/30/2022   Pt will be independent with HEP for improved strength, balance, transfers, and gait. Baseline:  Goal status: IN PROGRESS   2.  Pt will improve 5x sit<>stand to less than or equal to 15 sec to demonstrate improved functional strength and transfer efficiency. Baseline: 27.97 sec BUE support at eval Goal status: IN PROGRESS   3.  Pt will improve TUG score to less than or equal to 20 sec for decreased fall risk. Baseline: 39.12 sec with RW Goal status: IN PROGRESS   4.  Pt will improve gait velocity to at least 1.8 ft/sec for improved gait efficiency and safety. Baseline: 0.99 ft/sec Goal status: IN PROGRESS    5.  Pt will improve Berg score to at least 32/56 to decrease fall risk. Baseline: 14/56 Goal status: IN PROGRESS   ASSESSMENT:   CLINICAL IMPRESSION: Assessed Berg test today, with pt scoring 18/56, improved from 14/56 at eval.  Pt has met 1 of 3 STGs.  Pt has ongoing c/o vertigo and dizziness, but today in static standing with Merrilee Jansky, he has increased trunk sway and reports this is is dizziness feeling.  He also reports he had a near fall yesterday with gait.  He continues to demonstrate decreased strength, decreased balance, decreased safety awareness, decreased independence with gait.  Updated HEP to include supine glut and quad strengthening for safe  performance at home.  Pt and son indicate that the [facility] [address] location is closer to their home/work and would be a better location to continue with therapy.  Pt will continue to benefit from skilled PT towards LTGs for improved strength, balance, gait for improved overall functional mobility and decreased fall risk.   OBJECTIVE IMPAIRMENTS Abnormal gait, decreased balance, decreased coordination, decreased mobility, difficulty walking, decreased ROM, decreased strength, decreased safety awareness, dizziness, impaired flexibility, impaired tone, and postural dysfunction.    ACTIVITY  LIMITATIONS sitting, standing, stairs, transfers, and locomotion level   PARTICIPATION LIMITATIONS: driving, community activity, and living independently   [place] Time since onset of injury/illness/exacerbation and 3+ comorbidities: see above  are also affecting patient's functional outcome.    REHAB POTENTIAL: Good   CLINICAL DECISION MAKING: Evolving/moderate complexity   EVALUATION COMPLEXITY: Moderate   PLAN: PT FREQUENCY: 2x/week   PT DURATION: 8 weeks, including eval week   PLANNED INTERVENTIONS: Therapeutic exercises, Therapeutic activity, Neuromuscular re-education, Balance training, Gait training, Patient/Family education, Self Care, Stair training, Vestibular training, Canalith repositioning, DME instructions, and Manual therapy   PLAN FOR NEXT SESSION: Review supine exercises added to HEP.  Considering transfer to [facility] to be closer to [employer]  If so, he may benefit from BWSTT for gait training.  Continue work on gluts, hamstrings, quads; work on [place], weightbearing through LLE with standing, transfers, wider BOS with gait   Mady Haagensen, PT 07/08/22 4:17 PM Phone: [phone] Fax: [facility] [place], [place], [place] [address] Phone # [phone] Fax # [phone]

## 2022-07-08 NOTE — Therapy (Signed)
OUTPATIENT OCCUPATIONAL THERAPY NEURO  Treatment Session  Patient Name: Barry [name] MRN: [id number] DOB:04/12/1933, 88 y.o., male Today's Date: 07/08/2022  PCP: Susy Frizzle, MD REFERRING PROVIDER: Penni Bombard, MD    OT End of Session - 07/08/22 1552     Visit Number 6    Number of Visits 17    Date for OT Re-Evaluation 08/06/22    Authorization Type Medicare A &B; [employer] (secondary)    OT Start Time 1404    OT Stop Time 1444    OT Time Calculation (min) 40 min    Activity Tolerance Patient tolerated treatment well    Behavior During Therapy [facility] for tasks assessed/performed                  Past Medical History:  Diagnosis Date   AAA (abdominal aortic aneurysm) ([place]) 12/17/2007   4.7 cm   Allergy    rhinitis   Anxiety    Cancer ([place])    skin   Cataract    Depression    Diabetes mellitus    Diverticulosis    GERD (gastroesophageal reflux disease)    Hepatitis    Hx of colonic polyps 08/26/2015   Hyperlipidemia    Hypertension    Nephrolithiasis    Personal history of colonic polyps    adenomas, serrated also   PVD (peripheral vascular disease) ([place])    Stroke ([place])    Tobacco abuse    Past Surgical History:  Procedure Laterality Date   COLONOSCOPY  2006, 2008, 2010, 05/27/2011   numerous adenomas - 13 in 2006, 3, 2008, 2 2012 (up to 1 cm), 4 diminutive adenomas, serrated adenomas 2012. Diverticulosis and hemorrhoids also.   EYE SURGERY     EYE SURGERY Left    Kidney stone operation     lower limb amputation, other toe 5th     percutaneous stent graft repair of infrarenal AAA  11/10   5.6 cm (T. early)   surgical excision basal cell carcinoma     TEE WITHOUT CARDIOVERSION N/A 07/29/2021   Procedure: TRANSESOPHAGEAL ECHOCARDIOGRAM (TEE);  Surgeon: Geralynn Rile, MD;  Location: [facility];  Service: Cardiovascular;  Laterality: N/A;   tympanic eardrum repair     VASECTOMY     Patient Active Problem List   Diagnosis  Date Noted   Right thalamic infarction ([place]) 07/31/2021   Stage 3a chronic kidney disease ([place])    Benign essential HTN    Left hemiparesis ([place])    Type 2 diabetes mellitus with hyperglycemia, without long-term current use of insulin ([place])    CVA (cerebral vascular accident) ([place]) 07/27/2021   Frequent PVCs 03/20/2020   Aortic stenosis 03/20/2020   COPD (chronic obstructive pulmonary disease) ([place]) 03/20/2020   Constipation 04/30/2019   Protein-calorie malnutrition ([place]) 04/30/2019   Anxiety with depression 12/05/2018   Seasonal and perennial allergic rhinitis 08/09/2018   Carotid artery disease ([place]) 11/18/2017   Hx of colonic polyps 08/26/2015   Erectile dysfunction 02/17/2015   AAA (abdominal aortic aneurysm) without rupture ([place]) 05/21/2014   Chest pain 01/31/2014   CAD (coronary artery disease) 12/24/2013   HEARING LOSS, CONDUCTIVE, LEFT 02/23/2010   DIZZINESS, CHRONIC 05/28/2008   PULMONARY NODULE 09/07/2007   Diabetes mellitus type II, controlled ([place]) 09/06/2007   Hyperlipidemia 09/06/2007   TOBACCO ABUSE 09/06/2007   Essential hypertension 09/06/2007   Allergic rhinitis 09/06/2007   GERD 09/06/2007   BASAL CELL CARCINOMA, NOSE 09/06/2007    ONSET DATE:  07/27/2021  REFERRING DIAG: D63.875 (ICD-10-CM) - Cerebrovascular accident (CVA) due to bilateral embolism of posterior cerebral arteries (HCC) G81.94 (ICD-10-CM) - Left hemiparesis   THERAPY DIAG:  Hemiplegia and hemiparesis following cerebral infarction affecting left non-dominant side (HCC)  Other lack of coordination  Muscle weakness (generalized)  Unsteadiness on feet  Other abnormalities of gait and mobility  Rationale for Evaluation and Treatment Rehabilitation  SUBJECTIVE:   SUBJECTIVE STATEMENT: Pt reports that he was walking yesterday and felt his knee give out but was able to catch himself, but now is noticing some swelling in his L ankle.    Pt accompanied by: self and family member (son  remained in waiting room)  PERTINENT HISTORY: CAD, COPD, type 2 diabetes, hypertension, GERD, allergic rhinitis, and protein calorie malnutrition   PRECAUTIONS: Fall  WEIGHT BEARING RESTRICTIONS No  PAIN:  Are you having pain? Yes: NPRS scale: 2/10 Pain location: L knee Pain description: sore, tight Aggravating factors: standing, transfers, walking Relieving factors: rest, elevation .   FALLS: Has patient fallen in last 6 months? No  LIVING ENVIRONMENT: Lives with: lives with their family and was living independently prior to CVA but is now living with son and his wife Lives in: House/apartment Stairs: Yes: Internal: full flight to go upstairs, he uses a stair lift to get upstairs for shower, but otherwise remains on main floor steps; and External: 2 steps at front and 6 steps at back steps; uses RW to get down steps Has following equipment at home: Environmental consultant - 2 wheeled, Wheelchair (manual), Shower bench, bed side commode, Grab bars, and transport chair  PLOF: Independent, Independent with homemaking with ambulation, and Independent with gait  PATIENT GOALS to be able to walk again  OBJECTIVE:   HAND DOMINANCE: Right  COORDINATION: Finger Nose Finger test: mild to moderate dysmetria on L 9 Hole Peg test: Right: 36.13 sec; Left: 51.91 sec 06/28/22: Left: 45.09 sec Box and Blocks:  Right 43 blocks, Left 34 blocks 06/28/22: Left 33 blocks Rapid hand alternating movements: pt with decreased speed and coordination on L  COGNITION: Overall cognitive status:  reports some loss of memory of instances from before the stroke, reports difficulty with dates on important information from prior to stroke.  Reports no issues with STM or current recall of information.  Will need to be further assessed during next session and during functional  tasks   -------------------------------------------------------------------------------------------------------------------------------------------------------- (objective measures above completed at initial evaluation unless otherwise dated)   TODAY'S TREATMENT:  Self-care: discussed possibility of alternative bracing for L knee, recommending further discussion and trials with PT.  Care for ankle/knee after buckling and onset of swelling in L knee.  Educated on RICE. ROM: closed-chain AAROM with large therapy ball.  Pt completed shoulder flexion x20, with OT providing demonstration and mod verbal cues for improved technique to facilitate increased L shoulder flexion/ROM.  Engaged in horizontal abduction/adduction with pt tolerating x20, however pt then reporting increased pain post exercise. Further assessment of L shoulder: no subluxation.  Pt reports pain with internal and external rotation.  Reiterated recommendation to have shoulder assessed by PCP and spoke with son afterwards to facilitate follow up.  Pt continues to verbalize questions about whether LUE concerns are neuro vs ortho.  Therapist reiterating previous education about stroke impairments as well as orthopedic impairments, reiterating recommendation to follow up with PCP. Resistance Clothespins: 2,4,6# with LUE for mid functional reaching and sustained pinch. Pt req'd min cues for movement pattern and maintaining good positioning of LUE  with reach.  Mild decreased coordination when picking up clothespins, dropping 2 of 20.  OT modified positioning of clothespins to facilitate increased diagonal and vertical reach within pain tolerance.     07/05/22 Pipe tree puzzle: engaged in BUE coordination task in sitting progressing to standing to focus on motor control, functional reach, and balance.  Pt continues to report dizziness and instability in standing requiring min assist initially for pt to gain increased standing balance/posture.   Therapist able to fade from min - CGA throughout session as pt demonstrating mild increase in stability with increased upright standing. Pt tolerated standing 1-3 mins each attempt with min - CGA due to instability and reports of dizziness.  Noted mild impairments with picking up and manipulating PVC pieces into pattern. Self-care: educated on activity tolerance and current decrease in tolerance due to decreased expenditure.  Engaged in discussion about pt's medical concerns regarding his L shoulder pain (pt frequently mentions that he thinks UE issues are more from injury than CVA) as well as his persistent dizziness/instability with standing tasks.      PATIENT EDUCATION: Education details: Educated on care for shoulder with sleeping suggestions and RICE for ankle.  Reiterated education on neuro deficits and f/u with PCP Person educated: Patient and Child(ren) Education method: Explanation and Verbal cues Education comprehension: verbalized understanding and needs further education   HOME EXERCISE PROGRAM: Fine motor control HEP    GOALS: Goals reviewed with patient? Yes  SHORT TERM GOALS: Target date: 07/09/2022  Pt will be independent with FMC/GMC HEP to increase independence with ADLs and IADLs. Baseline: Goal status: NOT MET - pt demonstrates decreased carryover and recall of education as well as decreased understanding of impairments 07/08/22  2.  Pt will verbalize understanding of task modifications, adaptive strategies, and/or potential AE needs to increase ease, safety, and independence w/ ADLs and IADLs. Baseline:  Goal status: NOT MET - pt with decreased recall of education as well as pt frequently focused on dizziness, LOB, and shoulder concerns 07/08/22  3.  Pt will demonstrate improved fine motor coordination for ADLs (buttons, tying shoes) as evidenced by decreasing 9 hole peg test score for LUE by 5 secs Baseline: 9 Hole Peg test: Right: 36.13 sec; Left: 51.91 sec Goal  status: MET - Left: 45.09 sec on 06/28/22   LONG TERM GOALS: Target date: 08/06/2022  Pt will perform dynamic standing task for simple IADLs w/o LOB using DME and/or countertop support prn. Baseline:  Goal status: IN PROGRESS  2.  Pt will demonstrate improved fine motor coordination for ADLs as evidenced by decreasing 9 hole peg test score for LUE by 10 secs Baseline: 9 Hole Peg test: Right: 36.13 sec; Left: 51.91 sec Goal status: REVISED increased from 8 sec improvement to 10 sec improvement based on progress as of 06/28/22  3.  Pt will demonstrate improved UE functional use for ADLs as evidenced by increasing box/ blocks score by 4 blocks with LUE Baseline: Box and Blocks:  Right 43 blocks, Left 34 blocks Goal status: IN PROGRESS  4.  Pt will demonstrate ability to safely complete functional bilateral IADL task (e.g., folding clothes, making a sandwich, microwave use, making the bed) w/ Mod I Baseline:  Goal status: IN PROGRESS  5. Pt will demonstrate awareness of return to driving recommendations.  Baseline:  Goal status: IN PROGRESS   ASSESSMENT:  CLINICAL IMPRESSION: Pt participation is limited by perseveration on shoulder limitations with OT providing education on neurological impairments in conjunction with h/o  injury prior to CVA, OT continues to recommend follow up with PCP.  Pt continues to demonstrate mild impairments with fine and gross motor tasks with LUE largely due to decreased motor control, attention to task, and recall of education from previous sessions.  Spoke with pt and pt's son, whom he resides with now, in regards to potential change of clinic location to allow services to be met closer to home.  Pt and pt's son report understanding of potential delay in scheduling due to therapist availability and they will follow up with scheduler at other [facility] neuro clinic and may return to this clinic for OT services if significant lapse in OT availability.    PERFORMANCE  DEFICITS in functional skills including ADLs, IADLs, coordination, dexterity, ROM, strength, pain, FMC, GMC, mobility, balance, body mechanics, endurance, decreased knowledge of precautions, decreased knowledge of use of DME, and UE functional use, cognitive skills including memory, and psychosocial skills including environmental adaptation.   IMPAIRMENTS are limiting patient from ADLs and IADLs.   COMORBIDITIES may have co-morbidities  that affects occupational performance. Patient will benefit from skilled OT to address above impairments and improve overall function.  MODIFICATION OR ASSISTANCE TO COMPLETE EVALUATION: Min-Moderate modification of tasks or assist with assess necessary to complete an evaluation.  OT OCCUPATIONAL PROFILE AND HISTORY: Detailed assessment: Review of records and additional review of physical, cognitive, psychosocial history related to current functional performance.  CLINICAL DECISION MAKING: LOW - limited treatment options, no task modification necessary  REHAB POTENTIAL: Good  EVALUATION COMPLEXITY: Low    PLAN: OT FREQUENCY: 1-2x/week  OT DURATION: 8 weeks  PLANNED INTERVENTIONS: self care/ADL training, therapeutic exercise, therapeutic activity, neuromuscular re-education, passive range of motion, balance training, functional mobility training, electrical stimulation, ultrasound, moist heat, cryotherapy, patient/family education, cognitive remediation/compensation, energy conservation, coping strategies training, and DME and/or AE instructions  RECOMMENDED OTHER SERVICES: NA  CONSULTED AND AGREED WITH PLAN OF CARE: Patient and family member/caregiver  PLAN FOR NEXT SESSION: Review coordination HEP, implement gross motor/ROM HEP within pain tolerance, engage in BUE functional tasks, transfers, functional reach and standing balance   Merissa [place], OTR/L 07/08/2022, 4:06 PM

## 2022-07-09 ENCOUNTER — Ambulatory Visit (INDEPENDENT_AMBULATORY_CARE_PROVIDER_SITE_OTHER): Payer: Medicare Other | Admitting: Pharmacist

## 2022-07-09 ENCOUNTER — Other Ambulatory Visit: Payer: Self-pay | Admitting: Family Medicine

## 2022-07-09 ENCOUNTER — Other Ambulatory Visit (HOSPITAL_COMMUNITY): Payer: Self-pay | Admitting: Psychiatry

## 2022-07-09 DIAGNOSIS — E1165 Type 2 diabetes mellitus with hyperglycemia: Secondary | ICD-10-CM

## 2022-07-09 DIAGNOSIS — E7800 Pure hypercholesterolemia, unspecified: Secondary | ICD-10-CM

## 2022-07-09 NOTE — Patient Instructions (Addendum)
Visit Information   Goals Addressed             This Visit's Progress    Track and Manage My Blood Pressure-Hypertension   On track    Timeframe:  Long-Range Goal Priority:  High Start Date:   01/01/21                          Expected End Date:   12/31/21                  Follow Up Date 12/31/21   - check blood pressure 3 times per week - write blood pressure results in a log or diary    Why is this important?   You won't feel high blood pressure, but it can still hurt your blood vessels.  High blood pressure can cause heart or kidney problems. It can also cause a stroke.  Making lifestyle changes like losing a little weight or eating less salt will help.  Checking your blood pressure at home and at different times of the day can help to control blood pressure.  If the doctor prescribes medicine remember to take it the way the doctor ordered.  Call the office if you cannot afford the medicine or if there are questions about it.     Notes:        Patient Care Plan: General Pharmacy (Adult)     Problem Identified: HTN, HLD, Depression/Anxiety, GERD, Type II DM   Priority: High  Onset Date: 01/01/2021     Long-Range Goal: Patient-Specific Goal   Start Date: 01/01/2021  Expected End Date: 07/04/2021  Recent Progress: On track  Priority: High  Note:   Current Barriers:  Unable to independently monitor therapeutic efficacy Unable to maintain control of blood pressure LDL above goal  Pharmacist Clinical Goal(s):  Over the next 180 days, patient will achieve adherence to monitoring guidelines and medication adherence to achieve therapeutic efficacy achieve control of blood pressure as evidenced by home monitoring maintain control of blood sugar as evidenced by A1c/glucose   adhere to prescribed medication regimen as evidenced by fill dates through collaboration with PharmD and provider.   Interventions: 1:1 collaboration with Buelah Manis, Modena Nunnery, MD regarding  development and update of comprehensive plan of care as evidenced by provider attestation and co-signature Inter-disciplinary care team collaboration (see longitudinal plan of care) Comprehensive medication review performed; medication list updated in electronic medical record  Hypertension (BP goal <130/80) -Not ideally controlled, home BP updated patient making appt -Current treatment: Metoprolol XL 12.47m Appropriate, Effective, Safe, Accessible Amlodipine 154mdaily Appropriate, Effective, Safe, Accessible Hydralazine 10037m8h Appropriate, Effective, Safe, Accessible Valsartan 160m65mily Appropriate, Effective, Safe, Accessible -Medications previously tried: lisinopril/HCTZ (swelling), Nifedipine (pt d/c on his own)  -Current home readings: Am readings: 133/66, 143/67, 141/67. 131/69, 151/79 -Denies hypotensive/hypertensive symptoms -Educated on BP goals and benefits of medications for prevention of heart attack, stroke and kidney damage; Importance of home blood pressure monitoring; Symptoms of hypotension and importance of maintaining adequate hydration; -Counseled to monitor BP at home at least a few times per week, document, and provide log at future appointments -He does not monitor often due to getting worked up over it -He stopped nifedipine on his own and his last office blood pressure was WNL.  He reports he was not taking it at this time. -Counseled him on need for home monitoring to adjust medications or monitor for increased/decreased BP/ -Recommended to continue current  medication Recommended follow up with cardiology on nifedipine.  Update 10/01/21 BP @ home?? - they have not been checking BP at home, but they did just get a cuff. Counseled on appropriate blood pressure goals. Previously on ramipril and HCTZ so I have asked that they monitor closely for 2 weeks and we will check back to determine if anything else needs to be added back.  In the mean time watch salt in  diet, contact me if they are noticing consistent readings > 140/90 before the two week period. No changes to medications at this time.   Hyperlipidemia/CAD: (LDL goal < 70) 07/09/22 -Unontrolled, fill history up to date -Current treatment: Rosuvastatin 72m  Appropriate,Effective, Safe, Accessible  Ezetimibe 172mppropriate,Effective, Safe, Accessible  -Medications previously tred: none noted -Educated on Cholesterol goals;  Benefits of statin for ASCVD risk reduction;  -Continues to be adherent' -Most recent lipid panel LDL 84. Continue current meds, no changes needed at this time due to patient age.  Update 10/01/21 LDL in hospital was 84 - goal < 70. Patient is hight risk due to previous stroke. Currently on moderate intensity statin.  There is also the potential of increased myopathy and rhabdomyolysis with amlodipine and simvastatin doses over 2058m Based on that I would consider a switch to Crestor 20m64mily for better LDL lowering and no interaction with amlodipine.  Update 12/31/21 Did not see fill of Crestor in recent fill history. Patient's son states he believes he is taking Crestor but will confirm when home around his medications. Recommend recheck lipids to make sure Ldl is moving towards goal < 70. Denies any adverse effects. FU after next OV for lipids.  Diabetes (A1c goal <8%) 07/09/22 -Controlled -Current medications: Januvia 100mg66mly Appropriate, Effective, Safe, Accessible Glipizide XL 5mg A21mopriate, Query effective, -Medications previously tried: metformin (headaches), Farxiga (UTI) -Current home glucose readings fasting glucose: 121, 155, 132, 141,  post prandial glucose: 5:30pm one day son came home - glucose was 90 -Denies hypoglycemic/hyperglycemic symptoms -Current meal patterns: reports a good balanced diet, eats what he wants -Exercise - active with yard work for himself and neighbors -Educated on A1c and blood sugar goals; Benefits of  routine self-monitoring of blood sugar; -He was unable to tolerate to Farxig[place]o UTI.  He was started on glipizide and Januvia.  Son reports some dizziness, but does not mention any glucose < 90.  I would shoot for A1c < 8 as his goal at this time.  As long as he keeps glucose 100-200 then he should meet this goal.  Have asked them to continue to monitor, would prefer to d/c glipizide at some point if possible.   Recheck A1c and make adjustments as needed.  Update 10/01/21 170-227 have been the range of sugars since coming home from rehab, no episodes of hypoglycemia. Monitoring sugars at home? 220s mid day today D/c Jardiance? - He does not remember why he stopped this, there was also a mention of a trial of Farxiga, however he reports that cost was the barrier in him taking this. Would he consider Jardiance/Farxiga  if price was not an issue? Yes the only barrier he can remember is cost. We spent a lot of time reviewing diet and portion control.  Counseled on limiting carbs to one source per meal.   For cardiovascular and renal benefits, I would consider a switch to Farxiga 5mg da12m and d/c glimepiride.  Work on carbohydrate servings and excess sugars.   Future plans to introduce low  dose metformin if glucose still not at goal. Of course this is only if we can get him approved for patient assistance program. FU in 2 weeks on glucose readings and PAP application.  Update 12/31/21 140-160 fasting glucose reported at home. He is not getting [place] through patient assistance. Discussed fasting glucose goals - sugars are showing big improvement since last discussion. He is tolerating [place] well. Due to recheck A1c next month, future plans to add low dose metformin if A1c not near or below goal. No changes at this time - we are working to set him up with [employer] pill packaging.  Depression/Anxiety (Goal: Control symptoms) -Controlled -Current treatment: Fluoxetine 33m Clonazepam  0.1212mprn -Medications previously tried/failed: none noted -Educated on Benefits of medication for symptom control  -Pt reports his mood has been great lately, no issues with sleep -Recommended to continue current medication  Update 10/01/21 Mentions vivid dreams at night, the son can hear him tossing and turning all night.  Mr. ShCharneyoes not realize he has not slept well the next morning. Started ropinirole recently in hospital due to restless legs.  This can often cause vivid dreams and sleep disturbance especially at the beginning of treatment.  Have asked his son to trial him off of this for a few nights to see if this resolves the issue. Has FU with PCP tomorrow 12/16 - will discuss at that appt.  GERD (Goal: Minimize symptoms) -Controlled -Current treatment  Famotidine 2047mrn -Medications previously tried: none noted -No longer taking famotidine scheduled, just takes prn -Reports his symptoms are very rare -Recommended to continue current medication   Patient Goals/Self-Care Activities Over the next 180 days, patient will:  - take medications as prescribed check glucose daily, document, and provide at future appointments check blood pressure at least a few times per week, document, and provide at future appointments  Follow Up Plan: The care management team will reach out to the patient again over the next 180 days.             Patient Care Plan: RN Care Manager Plan of Care  Completed 05/24/2022   Problem Identified: No plan of care established for management of chronic disease states  (HTN, DM2, CVA, COPD) Resolved 05/24/2022  Priority: High     Long-Range Goal: Development of plan of care for chronic disease management  (HTN, DM2, CVA, COPD) Completed 05/24/2022  Start Date: 10/23/2021  Expected End Date: 08/28/2022  Priority: High  Note:   Current Barriers:  Knowledge Deficits related to plan of care for management of HTN and DMII  No Advanced Directives in  place- patient's son states did receive advanced directives packet and plans to complete. Spoke with patient's son DarSaveon PlantR who reports pt continues to live with him and his wife. Home health PT discharged, they were working with him after being inpatient rehab after CVA back in early October 2022.  Son reports pt has balance issues, left side deficits with mainly left leg affected which has led to some falls, has DME in the home and stair lift for going up to the shower, has hired help at son's home from 8am-1230 pm which has helped tremendously, reports pt is walking some now for exercise, there are deficits with short term memory per son. Patient finished antibiotics for dysuria. Son reports blood pressure is checked frequently with "readings good", reports CBG checked once daily with fasting ranges 80-135, random ranges all under 200, appetite is very good. Patient recently  had changes to diabetes medication due to having some blood sugar readings in the 70's, this has helped, had a reading of 80 1-2 weeks ago. Patient PHQ-9=6, pt is on medication and recently wellbutrin added. LCSW previously declined. Patient's son reports no new changes, states " we are doing well, he's well taken care of"  RNCM Clinical Goal(s):  Patient will verbalize understanding of plan for management of HTN and DMII as evidenced by patient report, review of EHR and  through collaboration with RN Care manager, provider, and care team.   Interventions: 1:1 collaboration with primary care provider regarding development and update of comprehensive plan of care as evidenced by provider attestation and co-signature Inter-disciplinary care team collaboration (see longitudinal plan of care) Evaluation of current treatment plan related to  self management and patient's adherence to plan as established by provider   Diabetes Interventions:  (Status:  New goal. and Goal on track:  Yes.) Long Term Goal Assessed patient's  understanding of A1c goal: <7% Reviewed medications with patient and discussed importance of medication adherence Counseled on importance of regular laboratory monitoring as prescribed Review of patient status, including review of consultants reports, relevant laboratory and other test results, and medications completed Reviewed carbohydrate modified diet, portion control Reviewed the role of infection related to elevated CBG Reviewed sign/symptoms hypoglycemia and treatment Reviewed plan of care with patient's son including case closure today, son verbalizes understanding and is in agreement Lab Results  Component Value Date   HGBA1C 6.7 (H) 07/27/2021   Hypertension Interventions:  (Status:  New goal. and Goal on track:  Yes.) Long Term Goal Last practice recorded BP readings:  BP Readings from Last 3 Encounters:  10/05/21 (!) 163/91  10/02/21 (!) 142/78  08/26/21 (!) 147/67  Most recent eGFR/CrCl: No results found for: EGFR  No components found for: CRCL  Evaluation of current treatment plan related to hypertension self management and patient's adherence to plan as established by provider Reviewed medications with patient and discussed importance of compliance Advised patient, providing education and rationale, to monitor blood pressure daily and record, calling PCP for findings outside established parameters Discussed complications of poorly controlled blood pressure such as heart disease, stroke, circulatory complications, vision complications, kidney impairment, sexual dysfunction  Reviewed importance of following low sodium diet and reading food labels Reinforced importance of using DME in the home, completing exercised prescribed by PT Reviewed safety precautions  Patient Goals/Self-Care Activities: Take medications as prescribed   Attend all scheduled provider appointments Call provider office for new concerns or questions  check blood sugar at prescribed times: twice  daily check feet daily for cuts, sores or redness enter blood sugar readings and medication or insulin into daily log take the blood sugar log to all doctor visits take the blood sugar meter to all doctor visits trim toenails straight across fill half of plate with vegetables limit fast food meals to no more than 1 per week manage portion size read food labels for fat, fiber, carbohydrates and portion size keep feet up while sitting wash and dry feet carefully every day wear comfortable, cotton socks wear comfortable, well-fitting shoes check blood pressure daily write blood pressure results in a log or diary keep a blood pressure log take blood pressure log to all doctor appointments call doctor for signs and symptoms of high blood pressure keep all doctor appointments take medications for blood pressure exactly as prescribed report new symptoms to your doctor Limit fast food, read labels Practice portion control Continue  following carbohydrate modified and low sodium diet Continue to complete prescribed exercises (given by PT) daily Always ask for assistance as needed fall prevention strategies: change position slowly, use assistive device such as walker or cane (per provider recommendations) when walking, keep walkways clear, have good lighting in room. It is important to contact your provider if you have any falls, maintain muscle strength/tone by exercise per provider recommendations. Follow RULE OF 15 for low blood sugar management:  How to treat low blood sugars (Blood sugar less than 70 mg/dl  Please follow the RULE OF 15 for the treatment of hypoglycemia treatment (When your blood sugars are less than 70 mg/ dl) STEP  1:  Take 15 grams of carbohydrates when your blood sugar is low, which includes:   3-4 glucose tabs or  3-4 oz of juice or regular soda or  One tube of glucose gel STEP 2:  Recheck blood sugar in 15 minutes STEP 3:  If your blood sugar is still low at the 15  minute recheck ---then, go back to STEP 1 and treat again with another 15 grams of carbohydrates        The patient verbalized understanding of instructions, educational materials, and care plan provided today and DECLINED offer to receive copy of patient instructions, educational materials, and care plan.  Telephone follow up appointment with pharmacy team member scheduled for: 6 months  Edythe Clarity, [place], PharmD, [place] Clinical Pharmacist Practitioner [place] [phone]

## 2022-07-09 NOTE — Progress Notes (Signed)
Chronic Care Management Pharmacy Note   Summary: PharmD Follow up.  No longer on Farxiga due to UTI.  Glucose has normalized since starting Januvia.  BP appears to be slightly elevated.  Have asked them to schedule FU for A1c and updated labs.  NO changes at this time.  FU 6 months   07/09/2022 Name:  Barry [name] MRN:  [id number] DOB:  27-Mar-1933  Subjective: Barry [name] is an 88 y.o. year old male who is a primary patient of Pickard, Cammie Mcgee, MD.  The CCM team was consulted for assistance with disease management and care coordination needs.    Engaged with patient by telephone for follow up visit in response to provider referral for pharmacy case management and/or care coordination services.   Consent to Services:  The patient was given the following information about Chronic Care Management services today, agreed to services, and gave verbal consent: 1. CCM service includes personalized support from designated clinical staff supervised by the primary care provider, including individualized plan of care and coordination with other care providers 2. 24/7 contact phone numbers for assistance for urgent and routine care needs. 3. Service will only be billed when office clinical staff spend 20 minutes or more in a month to coordinate care. 4. Only one practitioner may furnish and bill the service in a calendar month. 5.The patient may stop CCM services at any time (effective at the end of the month) by phone call to the office staff. 6. The patient will be responsible for cost sharing (co-pay) of up to 20% of the service fee (after annual deductible is met). Patient agreed to services and consent obtained.  Patient Care Team: Susy Frizzle, MD as PCP - General (Family Medicine) Early, Arvilla Meres, MD (Vascular Surgery) Sherlynn Stalls, MD (Ophthalmology) Allyn Kenner, MD (Dermatology) Josue Hector, MD (Cardiology) Edythe Clarity, [facility] as Pharmacist (Pharmacist) Cloria Spring, MD as Consulting Physician ([place])  Recent office visits:  03/09/21 Dr. Dennard Schaumann For follow-up. No medication changes.    Recent consult visits:  04/02/21 (Video Visit) [place] Cloria Spring, MD. For generalized anxiety.    Hospital visits:  07/27/21 (ED - Hospital) - bilateral CVA, metoprolol and amlodipine were added due to elevated BP  Objective:  Lab Results  Component Value Date   CREATININE 1.34 (H) 01/04/2022   BUN 32 (H) 01/04/2022   GFR 76.82 07/10/2013   GFRNONAA >60 10/05/2021   GFRAA 66 03/19/2021   NA 139 01/04/2022   K 4.1 01/04/2022   CALCIUM 9.2 01/04/2022   CO2 28 01/04/2022    Lab Results  Component Value Date/Time   HGBA1C 8.1 (H) 01/04/2022 04:30 PM   HGBA1C 6.7 (H) 07/27/2021 12:31 PM   GFR 76.82 07/10/2013 12:13 PM   GFR 69.02 06/06/2012 10:45 AM   MICROALBUR 232.9 03/19/2021 09:15 AM   MICROALBUR 25.6 11/17/2017 08:38 AM    Last diabetic Eye exam:  Lab Results  Component Value Date/Time   HMDIABEYEEXA Retinopathy (A) 03/03/2021 10:05 AM    Last diabetic Foot exam:  Lab Results  Component Value Date/Time   HMDIABFOOTEX yes 04/29/2009 12:00 AM     Lab Results  Component Value Date   CHOL 164 07/27/2021   HDL 64 07/27/2021   LDLCALC 84 07/27/2021   LDLDIRECT 152.6 05/28/2008   TRIG 81 07/27/2021   CHOLHDL 2.6 07/27/2021       Latest Ref Rng & Units 08/03/2021    5:43 AM 07/27/2021  12:31 PM 03/19/2021    9:15 AM  Hepatic Function  Total Protein 6.5 - 8.1 g/dL 5.4  7.9  5.8   Albumin 3.5 - 5.0 g/dL 2.4  4.4    AST 15 - 41 U/L '15  25  18   ' ALT 0 - 44 U/L '13  17  12   ' Alk Phosphatase 38 - 126 U/L 36  49    Total Bilirubin 0.3 - 1.2 mg/dL 0.6  0.5  0.3     Lab Results  Component Value Date/Time   TSH 1.266 12/21/2018 07:21 AM   TSH 2.071 10/27/2018 06:58 AM   TSH 2.080 02/05/2015 08:17 AM   TSH 1.59 06/06/2012 10:45 AM       Latest Ref Rng & Units 01/04/2022    4:30 PM 10/05/2021    1:09 PM  08/03/2021    5:43 AM  CBC  WBC 3.8 - 10.8 Thousand/uL 14.1  9.2  9.5   Hemoglobin 13.2 - 17.1 g/dL 13.0  13.8  13.5   Hematocrit 38.5 - 50.0 % 39.7  42.8  40.1   Platelets 140 - 400 Thousand/uL 237  302  231     Lab Results  Component Value Date/Time   VD25OH 23.10 (L) 08/04/2021 05:20 AM    Clinical ASCVD: Yes  The ASCVD Risk score (Arnett DK, et al., 2019) failed to calculate for the following reasons:   The 2019 ASCVD risk score is only valid for ages 54 to 28   The patient has a prior MI or stroke diagnosis       12/25/2021    2:11 PM 12/15/2021    1:53 PM 10/23/2021    2:00 PM  Depression screen PHQ 2/9  Decreased Interest 0 1 0  Down, Depressed, Hopeless '2 2 3  ' PHQ - 2 Score '2 3 3  ' Altered sleeping 0 0 1  Tired, decreased energy 0 2 1  Change in appetite 0 0 0  Feeling bad or failure about yourself  0 1 0  Trouble concentrating 0 0 1  Moving slowly or fidgety/restless 0 1 0  Suicidal thoughts 0 0 0  PHQ-9 Score '2 7 6  ' Difficult doing work/chores Somewhat difficult Somewhat difficult       Social History   Tobacco Use  Smoking Status Former   Packs/day: 1.00   Years: 60.00   Total pack years: 60.00   Types: Cigarettes  Smokeless Tobacco Never  Tobacco Comments   Hasn't smoked since he had his stroke    BP Readings from Last 3 Encounters:  07/05/22 (!) 151/82  06/16/22 (!) 149/76  02/23/22 140/68   Pulse Readings from Last 3 Encounters:  07/05/22 66  06/16/22 61  02/23/22 69   Wt Readings from Last 3 Encounters:  01/04/22 162 lb (73.5 kg)  12/25/21 170 lb (77.1 kg)  11/03/21 165 lb 6.4 oz (75 kg)    Assessment/Interventions: Review of patient past medical history, allergies, medications, health status, including review of consultants reports, laboratory and other test data, was performed as part of comprehensive evaluation and provision of chronic care management services.   SDOH:  (Social Determinants of Health) assessments and interventions  performed: No, done within year Financial Resource Strain: Low Risk  (12/25/2021)   Overall Financial Resource Strain (CARDIA)    Difficulty of Paying Living Expenses: Not hard at all    SDOH Interventions    Flowsheet Row Clinical Support from 12/25/2021 in [place] Video Visit from  12/15/2021 in confidential department Chronic Care Management from 10/23/2021 in [place]  SDOH Interventions     Food Insecurity Interventions Intervention Not Indicated -- Intervention Not Indicated  Housing Interventions Intervention Not Indicated -- --  Transportation Interventions Intervention Not Indicated -- Intervention Not Indicated  Depression Interventions/Treatment  Counseling  [facility] counseling session with Dr. Harrington Challenger out of [place] last Monday.] Currently on Treatment Medication  Financial Strain Interventions Intervention Not Indicated -- --  Physical Activity Interventions Exercise Physiologist for Individual Counseling -- --  Stress Interventions Other (Comment)  [Related to recent health changes. Sees Dr. Harrington Challenger for counseling.] -- --  Social Connections Interventions Other (Comment)  Vassie Moselle with son and daughter in law.] -- --       CCM Care Plan  Allergies  Allergen Reactions   Lisinopril-Hydrochlorothiazide Swelling    ZESTRIL   Doxycycline Rash    Medications Reviewed Today     Reviewed by Edythe Clarity, [facility] (Pharmacist) on 07/09/22 at 1041  Med List Status: <None>   Medication Order Taking? Sig Documenting Provider Last Dose Status Informant  acetaminophen (TYLENOL) 325 MG tablet [id number] Yes Take 2 tablets (650 mg total) by mouth every 6 (six) hours as needed for mild pain (or Fever >/= 101). Cathlyn Parsons, PA-C Taking Active   amLODipine (NORVASC) 10 MG tablet [id number] Yes Take 1 tablet (10 mg total) by mouth daily. Susy Frizzle, MD Taking Active   artificial tears (LACRILUBE) OINT ophthalmic ointment [id number] Yes Place into  both eyes every 4 (four) hours as needed for dry eyes. Angiulli, Lavon Paganini, PA-C Taking Active   B Complex-C (B-COMPLEX WITH VITAMIN C) tablet [id number] Yes Take 1 tablet by mouth daily. Cathlyn Parsons, PA-C Taking Active   buPROPion (WELLBUTRIN XL) 150 MG 24 hr tablet [id number] Yes TAKE 1 TABLET (150 MG TOTAL) BY MOUTH DAILY. Susy Frizzle, MD Taking Active   clonazePAM (KLONOPIN) 0.5 MG tablet [id number] Yes TAKE 1/2 TABLET BY MOUTH 3 TIMES DAILY AS NEEDED FOR ANXIETY. Susy Frizzle, MD Taking Active   clopidogrel (PLAVIX) 75 MG tablet [id number] Yes Take 1 tablet (75 mg total) by mouth daily. Susy Frizzle, MD Taking Active   diclofenac Sodium (VOLTAREN) 1 % GEL [id number] Yes Apply 2 g topically 4 (four) times daily. Cathlyn Parsons, PA-C Taking Active   ezetimibe (ZETIA) 10 MG tablet [id number] Yes TAKE 1 TABLET BY MOUTH DAILY Susy Frizzle, MD Taking Active   FLUoxetine (PROZAC) 10 MG capsule [id number] Yes Take 1 capsule (10 mg total) by mouth daily. Cloria Spring, MD Taking Active   FLUoxetine (PROZAC) 20 MG capsule [id number] Yes TAKE (1) CAPSULE BY MOUTH EACH MORNING. Cloria Spring, MD Taking Active   fluticasone [facility]) 50 MCG/ACT nasal spray [id number] Yes Place 2 sprays into both nostrils daily. Susy Frizzle, MD Taking Active   gabapentin (NEURONTIN) 300 MG capsule [id number] Yes Take 1 capsule (300 mg total) by mouth 2 (two) times daily as needed (nerve pain). Susy Frizzle, MD Taking Active   glipiZIDE (GLIPIZIDE XL) 5 MG 24 hr tablet [id number] Yes Take 1 tablet (5 mg total) by mouth daily with breakfast. Susy Frizzle, MD Taking Active   hydrALAZINE (APRESOLINE) 100 MG tablet [id number] Yes TAKE 1 TABLET BY MOUTH EVERY 8 HOURS. Susy Frizzle, MD Taking Active   JANUVIA 100 MG tablet [id number] Yes TAKE 1 TABLET (100 MG TOTAL) BY MOUTH DAILY. Susy Frizzle, MD Taking Active  magnesium gluconate (MAGONATE) 500 MG tablet [id number] Yes Take 0.5  tablets (250 mg total) by mouth at bedtime. Cathlyn Parsons, PA-C Taking Active            Med Note Jacobo Forest, Reynaldo Minium Nov 11, 2021  3:03 PM) 11/11/21 son has to pick up  metoprolol succinate (TOPROL-XL) 25 MG 24 hr tablet [id number] Yes Take 0.5 tablets (12.5 mg total) by mouth daily. Susy Frizzle, MD Taking Active   montelukast (SINGULAIR) 10 MG tablet [id number] Yes TAKE 1 TABLET (10 MG TOTAL) BY MOUTH AT BEDTIME. Susy Frizzle, MD Taking Active   rOPINIRole (REQUIP) 0.25 MG tablet [id number] Yes Take 1 tablet (0.25 mg total) by mouth at bedtime. Susy Frizzle, MD Taking Active   rosuvastatin (CRESTOR) 20 MG tablet [id number] Yes Take 1 tablet (20 mg total) by mouth daily. Susy Frizzle, MD Taking Active   sulfamethoxazole-trimethoprim (BACTRIM DS) 800-160 MG tablet [id number] Yes Take 1 tablet by mouth 2 (two) times daily. Susy Frizzle, MD Taking Active   valsartan (DIOVAN) 160 MG tablet [id number] Yes Take 1 tablet (160 mg total) by mouth 2 (two) times daily. Susy Frizzle, MD Taking Active   vitamin B-12 (CYANOCOBALAMIN) 1000 MCG tablet [id number] Yes Take 1,000 mcg by mouth daily. [provider] Taking Active Multiple Informants            Patient Active Problem List   Diagnosis Date Noted   Right thalamic infarction ([place]) 07/31/2021   Stage 3a chronic kidney disease ([place])    Benign essential HTN    Left hemiparesis ([place])    Type 2 diabetes mellitus with hyperglycemia, without long-term current use of insulin ([place])    CVA (cerebral vascular accident) ([place]) 07/27/2021   Frequent PVCs 03/20/2020   Aortic stenosis 03/20/2020   COPD (chronic obstructive pulmonary disease) ([place]) 03/20/2020   Constipation 04/30/2019   Protein-calorie malnutrition ([place]) 04/30/2019   Anxiety with depression 12/05/2018   Seasonal and perennial allergic rhinitis 08/09/2018   Carotid artery disease ([place]) 11/18/2017   Hx of colonic polyps 08/26/2015   Erectile  dysfunction 02/17/2015   AAA (abdominal aortic aneurysm) without rupture ([place]) 05/21/2014   Chest pain 01/31/2014   CAD (coronary artery disease) 12/24/2013   HEARING LOSS, CONDUCTIVE, LEFT 02/23/2010   DIZZINESS, CHRONIC 05/28/2008   PULMONARY NODULE 09/07/2007   Diabetes mellitus type II, controlled ([place]) 09/06/2007   Hyperlipidemia 09/06/2007   TOBACCO ABUSE 09/06/2007   Essential hypertension 09/06/2007   Allergic rhinitis 09/06/2007   GERD 09/06/2007   BASAL CELL CARCINOMA, NOSE 09/06/2007    Immunization History  Administered Date(s) Administered   Fluad Quad(high Dose 65+) 07/12/2019, 12/15/2020, 08/12/2021   H1N1 10/15/2008   Influenza Split 07/12/2011, 08/18/2012   Influenza Whole 09/25/2007, 08/23/2008, 08/25/2010   Influenza, High Dose Seasonal PF 08/01/2013, 08/16/2014, 09/05/2018   Influenza-Unspecified 08/05/2015, 08/28/2016, 09/02/2017   Moderna Sars-Covid-2 Vaccination 11/19/2019, 12/17/2019, 11/18/2020   Pneumococcal Conjugate-13 02/17/2015   Td 06/06/2012    Conditions to be addressed/monitored:  HTN, CAD, GERD, Type II DM, HLD,  Depression/Anxiety.    Care Plan : General Pharmacy (Adult)  Updates made by Edythe Clarity, RPH since 07/09/2022 12:00 AM     Problem: HTN, HLD, Depression/Anxiety, GERD, Type II DM   Priority: High  Onset Date: 01/01/2021     Long-Range Goal: Patient-Specific Goal   Start Date: 01/01/2021  Expected End Date: 07/04/2021  Recent Progress: On track  Priority: High  Note:  Current Barriers:  Unable to independently monitor therapeutic efficacy Unable to maintain control of blood pressure LDL above goal  Pharmacist Clinical Goal(s):  Over the next 180 days, patient will achieve adherence to monitoring guidelines and medication adherence to achieve therapeutic efficacy achieve control of blood pressure as evidenced by home monitoring maintain control of blood sugar as evidenced by A1c/glucose   adhere to prescribed  medication regimen as evidenced by fill dates through collaboration with PharmD and provider.   Interventions: 1:1 collaboration with Buelah Manis, Modena Nunnery, MD regarding development and update of comprehensive plan of care as evidenced by provider attestation and co-signature Inter-disciplinary care team collaboration (see longitudinal plan of care) Comprehensive medication review performed; medication list updated in electronic medical record  Hypertension (BP goal <130/80) -Not ideally controlled, home BP updated patient making appt -Current treatment: Metoprolol XL 12.46m Appropriate, Effective, Safe, Accessible Amlodipine 169mdaily Appropriate, Effective, Safe, Accessible Hydralazine 100110m8h Appropriate, Effective, Safe, Accessible Valsartan 160m61mily Appropriate, Effective, Safe, Accessible -Medications previously tried: lisinopril/HCTZ (swelling), Nifedipine (pt d/c on his own)  -Current home readings: Am readings: 133/66, 143/67, 141/67. 131/69, 151/79 -Denies hypotensive/hypertensive symptoms -Educated on BP goals and benefits of medications for prevention of heart attack, stroke and kidney damage; Importance of home blood pressure monitoring; Symptoms of hypotension and importance of maintaining adequate hydration; -Counseled to monitor BP at home at least a few times per week, document, and provide log at future appointments -He does not monitor often due to getting worked up over it -He stopped nifedipine on his own and his last office blood pressure was WNL.  He reports he was not taking it at this time. -Counseled him on need for home monitoring to adjust medications or monitor for increased/decreased BP/ -Recommended to continue current medication Recommended follow up with cardiology on nifedipine.  Update 10/01/21 BP @ home?? - they have not been checking BP at home, but they did just get a cuff. Counseled on appropriate blood pressure goals. Previously on ramipril and  HCTZ so I have asked that they monitor closely for 2 weeks and we will check back to determine if anything else needs to be added back.  In the mean time watch salt in diet, contact me if they are noticing consistent readings > 140/90 before the two week period. No changes to medications at this time.   Hyperlipidemia/CAD: (LDL goal < 70) 07/09/22 -Unontrolled, fill history up to date -Current treatment: Rosuvastatin 20mg66mpropriate,Effective, Safe, Accessible  Ezetimibe 10mgA91mpriate,Effective, Safe, Accessible  -Medications previously tred: none noted -Educated on Cholesterol goals;  Benefits of statin for ASCVD risk reduction;  -Continues to be adherent' -Most recent lipid panel LDL 84. Continue current meds, no changes needed at this time due to patient age.  Update 10/01/21 LDL in hospital was 84 - goal < 70. Patient is hight risk due to previous stroke. Currently on moderate intensity statin.  There is also the potential of increased myopathy and rhabdomyolysis with amlodipine and simvastatin doses over 20mg. 55med on that I would consider a switch to Crestor 20mg da34mfor better LDL lowering and no interaction with amlodipine.  Update 12/31/21 Did not see fill of Crestor in recent fill history. Patient's son states he believes he is taking Crestor but will confirm when home around his medications. Recommend recheck lipids to make sure Ldl is moving towards goal < 70. Denies any adverse effects. FU after next OV for lipids.  Diabetes (A1c goal <8%) 07/09/22 -Controlled -Current medications: Januvia  1551m daily Appropriate, Effective, Safe, Accessible Glipizide XL 566mAppropriate, Query effective, -Medications previously tried: metformin (headaches), FaWilder GladeUTI) -Current home glucose readings fasting glucose: 121, 155, 132, 141,  post prandial glucose: 5:30pm one day son came home - glucose was 90 -Denies hypoglycemic/hyperglycemic symptoms -Current meal patterns:  reports a good balanced diet, eats what he wants -Exercise - active with yard work for himself and neighbors -Educated on A1c and blood sugar goals; Benefits of routine self-monitoring of blood sugar; -He was unable to tolerate to FaIranue to UTI.  He was started on glipizide and Januvia.  Son reports some dizziness, but does not mention any glucose < 90.  I would shoot for A1c < 8 as his goal at this time.  As long as he keeps glucose 100-200 then he should meet this goal.  Have asked them to continue to monitor, would prefer to d/c glipizide at some point if possible.   Recheck A1c and make adjustments as needed.  Update 10/01/21 170-227 have been the range of sugars since coming home from rehab, no episodes of hypoglycemia. Monitoring sugars at home? 220s mid day today D/c Jardiance? - He does not remember why he stopped this, there was also a mention of a trial of Farxiga, however he reports that cost was the barrier in him taking this. Would he consider Jardiance/Farxiga  if price was not an issue? Yes the only barrier he can remember is cost. We spent a lot of time reviewing diet and portion control.  Counseled on limiting carbs to one source per meal.   For cardiovascular and renal benefits, I would consider a switch to Farxiga 51m53maily and d/c glimepiride.  Work on carbohydrate servings and excess sugars.   Future plans to introduce low dose metformin if glucose still not at goal. Of course this is only if we can get him approved for patient assistance program. FU in 2 weeks on glucose readings and PAP application.  Update 12/31/21 140-160 fasting glucose reported at home. He is not getting Far[place]rough patient assistance. Discussed fasting glucose goals - sugars are showing big improvement since last discussion. He is tolerating FarIranll. Due to recheck A1c next month, future plans to add low dose metformin if A1c not near or below goal. No changes at this time - we are  working to set him up with [facility] pill packaging.  Depression/Anxiety (Goal: Control symptoms) -Controlled -Current treatment: Fluoxetine 11m851monazepam 0.1251mg251m -Medications previously tried/failed: none noted -Educated on Benefits of medication for symptom control  -Pt reports his mood has been great lately, no issues with sleep -Recommended to continue current medication  Update 10/01/21 Mentions vivid dreams at night, the son can hear him tossing and turning all night.  Mr. ShumaOverbeck not realize he has not slept well the next morning. Started ropinirole recently in hospital due to restless legs.  This can often cause vivid dreams and sleep disturbance especially at the beginning of treatment.  Have asked his son to trial him off of this for a few nights to see if this resolves the issue. Has FU with PCP tomorrow 12/16 - will discuss at that appt.  GERD (Goal: Minimize symptoms) -Controlled -Current treatment  Famotidine 11mg 91m-Medications previously tried: none noted -No longer taking famotidine scheduled, just takes prn -Reports his symptoms are very rare -Recommended to continue current medication   Patient Goals/Self-Care Activities Over the next 180 days, patient will:  - take medications as prescribed check  glucose daily, document, and provide at future appointments check blood pressure at least a few times per week, document, and provide at future appointments  Follow Up Plan: The care management team will reach out to the patient again over the next 180 days.              Compliance/Adherence/Medication fill history: Care Gaps: Foot exam, eye exam, A1c  Star-Rating Drugs: Rosuvastatin 73m 06/19/22 90ds Valsartan 1665m09/07/23    Medication Assistance: None required.  Patient affirms current coverage meets needs.  [id number]: [phone]: [phone] Uses pill box? Yes Pt endorses 100% compliance  We discussed: Benefits of medication synchronization, packaging and delivery as well as enhanced pharmacist oversight with [facility]. Patient decided to: Utilize [facility] pharmacy for medication synchronization, packaging and delivery  Verbal consent obtained for [facility] enhanced pharmacy services (medication synchronization, adherence packaging, delivery coordination). A medication sync plan was created to allow patient to get all medications delivered once every 30 to 90 days per patient preference. Patient understands they have freedom to choose pharmacy and clinical pharmacist will coordinate care between all prescribers and [facility].  Care Plan and Follow Up Patient Decision:  Patient agrees to Care Plan and Follow-up.  Plan: The care management team will reach out to the patient again over the next 180 days.  ChBeverly MilchPharmD, CPP Clinical Pharmacist Practitioner [phone]

## 2022-07-09 NOTE — Telephone Encounter (Signed)
Requested Prescriptions  Pending Prescriptions Disp Refills  . hydrALAZINE (APRESOLINE) 100 MG tablet [Pharmacy Med Name: HYDRALAZINE 100 MG TABLET 100 Tablet] 90 tablet 7    Sig: TAKE 1 TABLET BY MOUTH EVERY 8 HOURS.     Cardiovascular:  Vasodilators Failed - 07/09/2022  9:23 AM      Failed - HGB in normal range and within 360 days    Hemoglobin  Date Value Ref Range Status  01/04/2022 13.0 (L) 13.2 - 17.1 g/dL Final         Failed - RBC in normal range and within 360 days    RBC  Date Value Ref Range Status  01/04/2022 4.12 (L) 4.20 - 5.80 Million/uL Final         Failed - WBC in normal range and within 360 days    WBC  Date Value Ref Range Status  01/04/2022 14.1 (H) 3.8 - 10.8 Thousand/uL Final         Failed - ANA Screen, Ifa, Serum in normal range and within 360 days    No results found for: "ANA", "ANATITER", "LABANTI"       Failed - Last BP in normal range    BP Readings from Last 1 Encounters:  07/05/22 (!) 151/82         Passed - HCT in normal range and within 360 days    HCT  Date Value Ref Range Status  01/04/2022 39.7 38.5 - 50.0 % Final         Passed - PLT in normal range and within 360 days    Platelets  Date Value Ref Range Status  01/04/2022 237 140 - 400 Thousand/uL Final         Passed - Valid encounter within last 12 months    Recent Outpatient Visits          4 months ago Type 2 diabetes mellitus with hyperglycemia, without long-term current use of insulin ([place])   [place] Pickard, Cammie Mcgee, MD   6 months ago [place], Cammie Mcgee, MD   8 months ago Essential hypertension   [place], Cammie Mcgee, MD   9 months ago Mass of parotid gland   [place] Pickard, Cammie Mcgee, MD   1 year ago Dizziness and giddiness   [place] Pickard, Cammie Mcgee, MD

## 2022-07-12 ENCOUNTER — Encounter: Payer: Self-pay | Admitting: Physical Therapy

## 2022-07-12 ENCOUNTER — Ambulatory Visit: Payer: Medicare Other | Admitting: Physical Therapy

## 2022-07-12 ENCOUNTER — Ambulatory Visit: Payer: Medicare Other | Admitting: Occupational Therapy

## 2022-07-12 DIAGNOSIS — M6281 Muscle weakness (generalized): Secondary | ICD-10-CM

## 2022-07-12 DIAGNOSIS — R2681 Unsteadiness on feet: Secondary | ICD-10-CM | POA: Diagnosis not present

## 2022-07-12 DIAGNOSIS — R2689 Other abnormalities of gait and mobility: Secondary | ICD-10-CM

## 2022-07-12 DIAGNOSIS — R293 Abnormal posture: Secondary | ICD-10-CM

## 2022-07-12 DIAGNOSIS — I69354 Hemiplegia and hemiparesis following cerebral infarction affecting left non-dominant side: Secondary | ICD-10-CM

## 2022-07-12 DIAGNOSIS — R278 Other lack of coordination: Secondary | ICD-10-CM | POA: Diagnosis not present

## 2022-07-12 NOTE — Therapy (Signed)
OUTPATIENT PHYSICAL THERAPY TREATMENT NOTE   Patient Name: Barry [name] MRN: [id number] DOB:07-15-1933, 88 y.o., male Today's Date: 07/12/2022  PCP:  Jenna Luo, MD REFERRING PROVIDER: Penni Bombard, MD   END OF SESSION:   PT End of Session - 07/12/22 1444     Visit Number 8    Number of Visits 16    Date for PT Re-Evaluation 07/30/22    Authorization Type Medicare/[employer]    Progress Note Due on Visit 10    PT Start Time 1401    PT Stop Time 1443    PT Time Calculation (min) 42 min    Equipment Utilized During Treatment Gait belt    Activity Tolerance Patient tolerated treatment well;Other (comment)   dizziness   Behavior During Therapy [facility] for tasks assessed/performed                 Past Medical History:  Diagnosis Date   AAA (abdominal aortic aneurysm) ([place]) 12/17/2007   4.7 cm   Allergy    rhinitis   Anxiety    Cancer ([place])    skin   Cataract    Depression    Diabetes mellitus    Diverticulosis    GERD (gastroesophageal reflux disease)    Hepatitis    Hx of colonic polyps 08/26/2015   Hyperlipidemia    Hypertension    Nephrolithiasis    Personal history of colonic polyps    adenomas, serrated also   PVD (peripheral vascular disease) ([place])    Stroke ([place])    Tobacco abuse    Past Surgical History:  Procedure Laterality Date   COLONOSCOPY  2006, 2008, 2010, 05/27/2011   numerous adenomas - 13 in 2006, 3, 2008, 2 2012 (up to 1 cm), 4 diminutive adenomas, serrated adenomas 2012. Diverticulosis and hemorrhoids also.   EYE SURGERY     EYE SURGERY Left    Kidney stone operation     lower limb amputation, other toe 5th     percutaneous stent graft repair of infrarenal AAA  11/10   5.6 cm (T. early)   surgical excision basal cell carcinoma     TEE WITHOUT CARDIOVERSION N/A 07/29/2021   Procedure: TRANSESOPHAGEAL ECHOCARDIOGRAM (TEE);  Surgeon: Geralynn Rile, MD;  Location: [facility];  Service: Cardiovascular;  Laterality:  N/A;   tympanic eardrum repair     VASECTOMY     Patient Active Problem List   Diagnosis Date Noted   Right thalamic infarction ([place]) 07/31/2021   Stage 3a chronic kidney disease ([place])    Benign essential HTN    Left hemiparesis ([place])    Type 2 diabetes mellitus with hyperglycemia, without long-term current use of insulin ([place])    CVA (cerebral vascular accident) ([place]) 07/27/2021   Frequent PVCs 03/20/2020   Aortic stenosis 03/20/2020   COPD (chronic obstructive pulmonary disease) ([place]) 03/20/2020   Constipation 04/30/2019   Protein-calorie malnutrition ([place]) 04/30/2019   Anxiety with depression 12/05/2018   Seasonal and perennial allergic rhinitis 08/09/2018   Carotid artery disease ([place]) 11/18/2017   Hx of colonic polyps 08/26/2015   Erectile dysfunction 02/17/2015   AAA (abdominal aortic aneurysm) without rupture ([place]) 05/21/2014   Chest pain 01/31/2014   CAD (coronary artery disease) 12/24/2013   HEARING LOSS, CONDUCTIVE, LEFT 02/23/2010   DIZZINESS, CHRONIC 05/28/2008   PULMONARY NODULE 09/07/2007   Diabetes mellitus type II, controlled ([place]) 09/06/2007   Hyperlipidemia 09/06/2007   TOBACCO ABUSE 09/06/2007   Essential hypertension 09/06/2007  Allergic rhinitis 09/06/2007   GERD 09/06/2007   BASAL CELL CARCINOMA, NOSE 09/06/2007    REFERRING DIAG:  J88.325 (ICD-10-CM) - Cerebrovascular accident (CVA) due to bilateral embolism of posterior cerebral arteries (HCC)  G81.94 (ICD-10-CM) - Left hemiparesis ([place])    THERAPY DIAG:  Muscle weakness (generalized)  Unsteadiness on feet  Other abnormalities of gait and mobility  Rationale for Evaluation and Treatment Rehabilitation  PERTINENT HISTORY: PMH:  AAA, anxiety, depression, skin cancer, DM, GERD, HLD, HTN, CVA, CAD, COPD  PRECAUTIONS:  Fall  SUBJECTIVE: Doing fair- nothing new going on.  Denies recent falls. Dizziness is about the same- "I can't stand up." Still wanting to transfer to [address] for PT as it  is closer to his son's home.   PAIN:  Are you having pain? No  OBJECTIVE:     TODAY'S TREATMENT: 07/12/22 Activity Comments  Review of updated HEP: Bridge 10x hooklying clamshell TB 10x  SAQ 10x each  Cues to reduce speed  R/L SLR 5x each Cues to maintain quad contraction throughout and improve eccentric lower; more difficulty on L LE  Hooklying march 2x20 2# Limited control of L LE  HS stretch with strap 2x30" each Cueing to maintain position of stretch throughout; PT assist on L LE d/t ataxia  Mod thomas stretch 2x30" each  Good tolerance  Sidelying hip abduction 2x10  Cueing for alignment and preventing compensation; PT assist on L LE  Sidelying hip adduction 2x10 with opposite LE elevated on bolster  PT assist on L LE    PATIENT EDUCATION: Education details: provided note with [address] [facility] rehab location and phone number- advised patient/son to call Person educated: Patient Education method: Explanation, Demonstration, Tactile cues, Verbal cues, and Handouts Education comprehension: verbalized understanding      Access Code: [email or web address] URL: [email or web address] Date: 07/08/2022 Prepared by: [facility]  Exercises - Sit to Stand with Armchair  - 1-2 x daily - 7 x weekly - 1 sets - 5 reps - 3 sec hold - Seated Hip Abduction with Resistance  - 1 x daily - 5 x weekly - 2 sets - 10 reps - Seated Ankle Dorsiflexion with Resistance  - 1 x daily - 5 x weekly - 2 sets - 10 reps - Side to side weightshift  - 1 x daily - 5 x weekly - 1-2 sets - 10 reps - Supine Bridge  - 1 x daily - 5 x weekly - 2 sets - 10 reps - 3 sec hold - Hooklying Clamshell with Resistance  - 1 x daily - 5 x weekly - 1-2 sets - 10 reps - Short Arc Quad with Ankle Weight  - 1 x daily - 5 x weekly - 1-2 sets - 10 reps   ---------------------------------------------------------------------------------- (objective measures completed at initial evaluation  unless otherwise dated)   DIAGNOSTIC FINDINGS: [facility] 07/27/[facility] to [facility] with acute onset of left-sided weakness.  CT/MRI showed acute right posterior lentiform nucleus, thalamus, anterior caudate infarct with additional punctate infarct in the lateral left thalamus and possible the left lentiform.  CT angiogram head and neck with mild atherosclerotic plaque of the bilateral carotid bulbs resulting in 30 to 40% stenosis on the right.  1 cm enhancing lesion in the right parotid gland indeterminate recommend ENT follow-up as outpatient.  MRA of the head showed no significant proximal stenosis aneurysm or occlusion.  Echocardiogram with ejection fraction of 60 to 49% grade 1 diastolic dysfunction.  TEE showed no left atrial appendage or thrombus detected.  No PFO.  (Per 07/2021 notes)   COGNITION: Overall cognitive status: Within functional limits for tasks assessed             SENSATION: Light touch: WFL Proprioception: WFL   COORDINATION: Decreased coordination, slowed movement pattern, LLE     MUSCLE TONE: LLE: Moderate     POSTURE: rounded shoulders and forward head   LOWER EXTREMITY ROM:      Active  Right Eval Left Eval  Hip flexion      Hip extension      Hip abduction      Hip adduction      Hip internal rotation      Hip external rotation      Knee flexion      Knee extension   Decreased full AROM (-24 degrees); passive -5 degrees  Ankle dorsiflexion      Ankle plantarflexion      Ankle inversion      Ankle eversion       (Blank rows = not tested)   LOWER EXTREMITY MMT:     MMT Right Eval Left Eval  Hip flexion 5 4  Hip extension      Hip abduction 5 4  Hip adduction 5 4  Hip internal rotation      Hip external rotation      Knee flexion 5 3+  Knee extension 5 3-  Ankle dorsiflexion 4 3+  Ankle plantarflexion      Ankle inversion   3+  Ankle eversion   3+  (Blank rows = not tested)     TRANSFERS: Assistive device utilized: Environmental consultant -  2 wheeled  Sit to stand: Min A Stand to sit: Min A LUE at walker, RUE pushes up from transport chair.  At times, he attempts to push up with BUEs on walker; he sits, keeping BUEs at walker.  Cues provided for correct hand placement for improved sit<>stand technique   GAIT: Gait pattern: step through pattern, decreased step length- Left, decreased ankle dorsiflexion- Left, knee flexed in stance- Right, knee flexed in stance- Left, scissoring, narrow BOS, and poor foot clearance- Left Distance walked: 40 ft Assistive device utilized: Environmental consultant - 2 wheeled Level of assistance: Min A Comments: 32.94 sec in 10 meter walk:  0.99 ft/sec   FUNCTIONAL TESTs:  5 times sit to stand: 27.97 sec with BUE support Timed up and go (TUG): 39.12 sec with RW Berg Balance Scale: 14/56 (Scores <45/56 indicate increased fall risk   PATIENT SURVEYS:  FOTO NA due to CVA >6 months ago   TODAY'S TREATMENT:  Sit<>stand from elevated mat height, simulating bed, x 5 reps, with cues for hand placement, foot placement; cues upon standing for glut/quad activation, upright posture.  HEP initiated-see below     PATIENT EDUCATION: Education details: Eval results, POC; initiated HEP-hand placement, safety awareness with sit<>Stand transfers Person educated: Patient Education method: Explanation, Demonstration, Tactile cues, Verbal cues, and Handouts Education comprehension: verbalized understanding, returned demonstration, verbal cues required, and needs further education     -------------------------------------------------------------------------------------------    GOALS: Goals reviewed with patient? Yes   SHORT TERM GOALS: Target date: 07/02/2022   Pt will be supervision with HEP for improved strength, balance, gait. Baseline: Goal status: MET   2.  Pt will improve 5x sit<>stand to less than or equal to 20 sec to demonstrate improved functional strength and transfer efficiency.  Baseline: 27.97 sec with  definite UE  support; 07/05/22 26.24 sec with B armrest support Goal status: NOT MET   3.  Pt will improve Berg score to at least 22/56 to decrease fall risk. Baseline: 14/56>18/56 07/08/2022 Goal status: NOT MET     LONG TERM GOALS: Target date: 07/30/2022   Pt will be independent with HEP for improved strength, balance, transfers, and gait. Baseline:  Goal status: IN PROGRESS   2.  Pt will improve 5x sit<>stand to less than or equal to 15 sec to demonstrate improved functional strength and transfer efficiency. Baseline: 27.97 sec BUE support at eval Goal status: IN PROGRESS   3.  Pt will improve TUG score to less than or equal to 20 sec for decreased fall risk. Baseline: 39.12 sec with RW Goal status: IN PROGRESS   4.  Pt will improve gait velocity to at least 1.8 ft/sec for improved gait efficiency and safety. Baseline: 0.99 ft/sec Goal status: IN PROGRESS    5.  Pt will improve Berg score to at least 32/56 to decrease fall risk. Baseline: 14/56 Goal status: IN PROGRESS   ASSESSMENT:   CLINICAL IMPRESSION: Patient arrived to session without new complaints, however still reports remaining dizziness when standing up. Also reports that he is still planning on transferring to [address] for PT d/t convenience. Upon observation, L ankle moderately edematous and without pitting edema, redness, warmth, or TTP in calf. Reviewed HEP for max carryover and understanding, providing cues to reduce pacing for max control. Continued with mat ther-ex to reduce c/o dizziness. Patient required occasional assist or correction of positioning for form. Patient reported no increase in dizziness at end of session.    OBJECTIVE IMPAIRMENTS Abnormal gait, decreased balance, decreased coordination, decreased mobility, difficulty walking, decreased ROM, decreased strength, decreased safety awareness, dizziness, impaired flexibility, impaired tone, and postural dysfunction.    ACTIVITY LIMITATIONS sitting,  standing, stairs, transfers, and locomotion level   PARTICIPATION LIMITATIONS: driving, community activity, and living independently   [place] Time since onset of injury/illness/exacerbation and 3+ comorbidities: see above  are also affecting patient's functional outcome.    REHAB POTENTIAL: Good   CLINICAL DECISION MAKING: Evolving/moderate complexity   EVALUATION COMPLEXITY: Moderate   PLAN: PT FREQUENCY: 2x/week   PT DURATION: 8 weeks, including eval week   PLANNED INTERVENTIONS: Therapeutic exercises, Therapeutic activity, Neuromuscular re-education, Balance training, Gait training, Patient/Family education, Self Care, Stair training, Vestibular training, Canalith repositioning, DME instructions, and Manual therapy   PLAN FOR NEXT SESSION:  Considering transfer to [facility] to be closer to [employer]  If so, he may benefit from BWSTT for gait training.  Continue work on gluts, hamstrings, quads; work on [place], weightbearing through LLE with standing, transfers, wider BOS with gait   Janene Harvey, PT, DPT 07/12/22 2:46 PM  [facility] at [facility] [place], [place], [place] [address] Phone # [phone] Fax # [phone]

## 2022-07-12 NOTE — Therapy (Addendum)
OUTPATIENT OCCUPATIONAL THERAPY NEURO  Treatment Session  Patient Name: Barry [name] MRN: [id number] DOB:1932-12-03, 88 y.o., male Today's Date: 07/12/2022  PCP: Susy Frizzle, MD REFERRING PROVIDER: Penni Bombard, MD    OT End of Session - 07/12/22 1319     Visit Number 7    Number of Visits 17    Date for OT Re-Evaluation 08/06/22    Authorization Type Medicare A &B; [employer] (secondary)    OT Start Time 1318    OT Stop Time 1358    OT Time Calculation (min) 40 min    Activity Tolerance Patient tolerated treatment well    Behavior During Therapy [facility] for tasks assessed/performed                   Past Medical History:  Diagnosis Date   AAA (abdominal aortic aneurysm) ([place]) 12/17/2007   4.7 cm   Allergy    rhinitis   Anxiety    Cancer ([place])    skin   Cataract    Depression    Diabetes mellitus    Diverticulosis    GERD (gastroesophageal reflux disease)    Hepatitis    Hx of colonic polyps 08/26/2015   Hyperlipidemia    Hypertension    Nephrolithiasis    Personal history of colonic polyps    adenomas, serrated also   PVD (peripheral vascular disease) ([place])    Stroke ([place])    Tobacco abuse    Past Surgical History:  Procedure Laterality Date   COLONOSCOPY  2006, 2008, 2010, 05/27/2011   numerous adenomas - 13 in 2006, 3, 2008, 2 2012 (up to 1 cm), 4 diminutive adenomas, serrated adenomas 2012. Diverticulosis and hemorrhoids also.   EYE SURGERY     EYE SURGERY Left    Kidney stone operation     lower limb amputation, other toe 5th     percutaneous stent graft repair of infrarenal AAA  11/10   5.6 cm (T. early)   surgical excision basal cell carcinoma     TEE WITHOUT CARDIOVERSION N/A 07/29/2021   Procedure: TRANSESOPHAGEAL ECHOCARDIOGRAM (TEE);  Surgeon: Geralynn Rile, MD;  Location: [facility];  Service: Cardiovascular;  Laterality: N/A;   tympanic eardrum repair     VASECTOMY     Patient Active Problem List    Diagnosis Date Noted   Right thalamic infarction ([place]) 07/31/2021   Stage 3a chronic kidney disease ([place])    Benign essential HTN    Left hemiparesis ([place])    Type 2 diabetes mellitus with hyperglycemia, without long-term current use of insulin ([place])    CVA (cerebral vascular accident) ([place]) 07/27/2021   Frequent PVCs 03/20/2020   Aortic stenosis 03/20/2020   COPD (chronic obstructive pulmonary disease) ([place]) 03/20/2020   Constipation 04/30/2019   Protein-calorie malnutrition ([place]) 04/30/2019   Anxiety with depression 12/05/2018   Seasonal and perennial allergic rhinitis 08/09/2018   Carotid artery disease ([place]) 11/18/2017   Hx of colonic polyps 08/26/2015   Erectile dysfunction 02/17/2015   AAA (abdominal aortic aneurysm) without rupture ([place]) 05/21/2014   Chest pain 01/31/2014   CAD (coronary artery disease) 12/24/2013   HEARING LOSS, CONDUCTIVE, LEFT 02/23/2010   DIZZINESS, CHRONIC 05/28/2008   PULMONARY NODULE 09/07/2007   Diabetes mellitus type II, controlled ([place]) 09/06/2007   Hyperlipidemia 09/06/2007   TOBACCO ABUSE 09/06/2007   Essential hypertension 09/06/2007   Allergic rhinitis 09/06/2007   GERD 09/06/2007   BASAL CELL CARCINOMA, NOSE 09/06/2007    ONSET  DATE: 07/27/2021  REFERRING DIAG: H47.425 (ICD-10-CM) - Cerebrovascular accident (CVA) due to bilateral embolism of posterior cerebral arteries (HCC) G81.94 (ICD-10-CM) - Left hemiparesis   THERAPY DIAG:  Hemiplegia and hemiparesis following cerebral infarction affecting left non-dominant side (HCC)  Other lack of coordination  Abnormal posture  Unsteadiness on feet  Rationale for Evaluation and Treatment Rehabilitation  SUBJECTIVE:   SUBJECTIVE STATEMENT: Pt reports that he is unaware of the status of transitioning him to the other neuro clinic due to proximity to home.  Pt accompanied by: self and family member (son dropped him off)  PERTINENT HISTORY: CAD, COPD, type 2 diabetes, hypertension, GERD,  allergic rhinitis, and protein calorie malnutrition   PRECAUTIONS: Fall  WEIGHT BEARING RESTRICTIONS No  PAIN:  Are you having pain? Yes: NPRS scale: 2/10 Pain location: L shoulder Pain description: sore Aggravating factors: reaching Relieving factors: rest  FALLS: Has patient fallen in last 6 months? No  LIVING ENVIRONMENT: Lives with: lives with their family and was living independently prior to CVA but is now living with son and his wife Lives in: House/apartment Stairs: Yes: Internal: full flight to go upstairs, he uses a stair lift to get upstairs for shower, but otherwise remains on main floor steps; and External: 2 steps at front and 6 steps at back steps; uses RW to get down steps Has following equipment at home: Environmental consultant - 2 wheeled, Wheelchair (manual), Shower bench, bed side commode, Grab bars, and transport chair  PLOF: Independent, Independent with homemaking with ambulation, and Independent with gait  PATIENT GOALS to be able to walk again  OBJECTIVE:   HAND DOMINANCE: Right  COORDINATION: Finger Nose Finger test: mild to moderate dysmetria on L 9 Hole Peg test: Right: 36.13 sec; Left: 51.91 sec 06/28/22: Left: 45.09 sec Box and Blocks:  Right 43 blocks, Left 34 blocks 06/28/22: Left 33 blocks Rapid hand alternating movements: pt with decreased speed and coordination on L  COGNITION: Overall cognitive status:  reports some loss of memory of instances from before the stroke, reports difficulty with dates on important information from prior to stroke.  Reports no issues with STM or current recall of information.  Will need to be further assessed during next session and during functional tasks   -------------------------------------------------------------------------------------------------------------------------------------------------------- (objective measures above completed at initial evaluation unless otherwise dated)   TODAY'S TREATMENT:  07/12/22 Coordination: picking up and placing various sized pegs with LUE with focus on motor control and ROM.  Pt requiring increased time, dropping 2 pegs when attempting to pick up from surface - only with smaller pegs.  Pt with no c/o pain in shoulder with progressively increased reach along peg board.  Removed pegs completing with in-hand manipulation and translation.  Pt with difficulty translating back to finger tips.   [place]: Therapist modified peg task to completing with coins and placing in coin slot for increased functional carryover.  Pt had to complete one at a time initially but then able to transition to completing with in-hand manipulation and translation.  OT providing mod demonstration and verbal cues for technique.  Pt dropping 3 of 15 coins when translating palm to finger tips. Engaged in tacking of coins.  Pt demonstrating difficulty when attempting to pick up from table top, frequently having to slide coin across table. Coordination: Placing large jacks into upright cones to facilitate grasp as well as functional reach.  Pt knocking over cones x2 due to decreased gross motor control.  Pt occasionally picking up more than one jack at  a time due to decreased coordination and sensation. GMC/FMC: placing rings over top of cones with focus on grasp and motor control with repetition.  Pt demonstrating decreased shoulder flexion and motor control when attempting to lift rings off of cones, knocking over cones x2.  OT facilitating increased reach by modifying placement of cones intermittently to further challenge shoulder flexion and reaching outside body and across midline.       07/08/22 Self-care: discussed possibility of alternative bracing for L knee, recommending further discussion and trials with PT.  Care for ankle/knee after buckling and onset of swelling in L knee.  Educated on RICE. ROM: closed-chain AAROM with large therapy ball.  Pt completed shoulder flexion x20, with OT providing  demonstration and mod verbal cues for improved technique to facilitate increased L shoulder flexion/ROM.  Engaged in horizontal abduction/adduction with pt tolerating x20, however pt then reporting increased pain post exercise. Further assessment of L shoulder: no subluxation.  Pt reports pain with internal and external rotation.  Reiterated recommendation to have shoulder assessed by PCP and spoke with son afterwards to facilitate follow up.  Pt continues to verbalize questions about whether LUE concerns are neuro vs ortho.  Therapist reiterating previous education about stroke impairments as well as orthopedic impairments, reiterating recommendation to follow up with PCP. Resistance Clothespins: 2,4,6# with LUE for mid functional reaching and sustained pinch. Pt req'd min cues for movement pattern and maintaining good positioning of LUE with reach.  Mild decreased coordination when picking up clothespins, dropping 2 of 20.  OT modified positioning of clothespins to facilitate increased diagonal and vertical reach within pain tolerance.     07/05/22 Pipe tree puzzle: engaged in BUE coordination task in sitting progressing to standing to focus on motor control, functional reach, and balance.  Pt continues to report dizziness and instability in standing requiring min assist initially for pt to gain increased standing balance/posture.  Therapist able to fade from min - CGA throughout session as pt demonstrating mild increase in stability with increased upright standing. Pt tolerated standing 1-3 mins each attempt with min - CGA due to instability and reports of dizziness.  Noted mild impairments with picking up and manipulating PVC pieces into pattern. Self-care: educated on activity tolerance and current decrease in tolerance due to decreased expenditure.  Engaged in discussion about pt's medical concerns regarding his L shoulder pain (pt frequently mentions that he thinks UE issues are more from injury than  CVA) as well as his persistent dizziness/instability with standing tasks.      PATIENT EDUCATION: Education details: Educated on care for shoulder with sleeping suggestions and RICE for ankle.  Reiterated education on neuro deficits and f/u with PCP Person educated: Patient and Child(ren) Education method: Explanation and Verbal cues Education comprehension: verbalized understanding and needs further education   HOME EXERCISE PROGRAM: Fine motor control HEP    GOALS: Goals reviewed with patient? Yes  SHORT TERM GOALS: Target date: 07/09/2022  Pt will be independent with FMC/GMC HEP to increase independence with ADLs and IADLs. Baseline: Goal status: NOT MET - pt demonstrates decreased carryover and recall of education as well as decreased understanding of impairments 07/08/22  2.  Pt will verbalize understanding of task modifications, adaptive strategies, and/or potential AE needs to increase ease, safety, and independence w/ ADLs and IADLs. Baseline:  Goal status: NOT MET - pt with decreased recall of education as well as pt frequently focused on dizziness, LOB, and shoulder concerns 07/08/22  3.  Pt will  demonstrate improved fine motor coordination for ADLs (buttons, tying shoes) as evidenced by decreasing 9 hole peg test score for LUE by 5 secs Baseline: 9 Hole Peg test: Right: 36.13 sec; Left: 51.91 sec Goal status: MET - Left: 45.09 sec on 06/28/22   LONG TERM GOALS: Target date: 08/06/2022  Pt will perform dynamic standing task for simple IADLs w/o LOB using DME and/or countertop support prn. Baseline:  Goal status: IN PROGRESS  2.  Pt will demonstrate improved fine motor coordination for ADLs as evidenced by decreasing 9 hole peg test score for LUE by 10 secs Baseline: 9 Hole Peg test: Right: 36.13 sec; Left: 51.91 sec Goal status: REVISED increased from 8 sec improvement to 10 sec improvement based on progress as of 06/28/22  3.  Pt will demonstrate improved UE  functional use for ADLs as evidenced by increasing box/ blocks score by 4 blocks with LUE Baseline: Box and Blocks:  Right 43 blocks, Left 34 blocks Goal status: IN PROGRESS  4.  Pt will demonstrate ability to safely complete functional bilateral IADL task (e.g., folding clothes, making a sandwich, microwave use, making the bed) w/ Mod I Baseline:  Goal status: IN PROGRESS  5. Pt will demonstrate awareness of return to driving recommendations.  Baseline:  Goal status: IN PROGRESS   ASSESSMENT:  CLINICAL IMPRESSION: Pt participation is limited by perseveration on shoulder limitations with OT providing education on neurological impairments in conjunction with h/o injury prior to CVA, OT continues to recommend follow up with PCP.  Pt demonstrating mild impairments with fine and gross motor tasks with LUE largely due to decreased motor control, attention to task, and recall of education from previous sessions.  Engaged in seated activity this session to truly focus on LUE as standing tolerance frequently limited by decreased instability in standing.  PERFORMANCE DEFICITS in functional skills including ADLs, IADLs, coordination, dexterity, ROM, strength, pain, FMC, GMC, mobility, balance, body mechanics, endurance, decreased knowledge of precautions, decreased knowledge of use of DME, and UE functional use, cognitive skills including memory, and psychosocial skills including environmental adaptation.   IMPAIRMENTS are limiting patient from ADLs and IADLs.   COMORBIDITIES may have co-morbidities  that affects occupational performance. Patient will benefit from skilled OT to address above impairments and improve overall function.  MODIFICATION OR ASSISTANCE TO COMPLETE EVALUATION: Min-Moderate modification of tasks or assist with assess necessary to complete an evaluation.  OT OCCUPATIONAL PROFILE AND HISTORY: Detailed assessment: Review of records and additional review of physical, cognitive,  psychosocial history related to current functional performance.  CLINICAL DECISION MAKING: LOW - limited treatment options, no task modification necessary  REHAB POTENTIAL: Good  EVALUATION COMPLEXITY: Low    PLAN: OT FREQUENCY: 1-2x/week  OT DURATION: 8 weeks  PLANNED INTERVENTIONS: self care/ADL training, therapeutic exercise, therapeutic activity, neuromuscular re-education, passive range of motion, balance training, functional mobility training, electrical stimulation, ultrasound, moist heat, cryotherapy, patient/family education, cognitive remediation/compensation, energy conservation, coping strategies training, and DME and/or AE instructions  RECOMMENDED OTHER SERVICES: NA  CONSULTED AND AGREED WITH PLAN OF CARE: Patient and family member/caregiver  PLAN FOR NEXT SESSION: Review coordination HEP, implement gross motor/ROM HEP within pain tolerance, engage in BUE functional tasks, transfers, functional reach and standing balance   Akeria Hedstrom, [place], OTR/L 07/12/2022, 2:11 PM   OCCUPATIONAL THERAPY DISCHARGE SUMMARY  Visits from Start of Care: 7  Current functional level related to goals / functional outcomes: Pt was limited in participation in therapy with LUE due to perseveration on shoulder limitations  with OT providing education on neurological impairments in conjunction with h/o injury prior to CVA. Pt demonstrating mild impairments with fine and gross motor tasks with LUE largely due to decreased motor control, attention to task, and recall of education from previous sessions.  Pt was to be transferred to another outpatient clinic for proximity to home, however never resumed OT services.   Remaining deficits: Pt demonstrating mild impairments with fine and gross motor tasks with LUE largely due to decreased motor control, attention to task, and recall of education from previous sessions.  Requiring CGA to min assist for transfers and dynamic standing due to instability and  reports of "dizziness" with mobility.   Education / Equipment: HEP for coordination, dynamic balance, care for shoulder/positioning   Patient agrees to discharge. Patient goals were not met. Patient is being discharged due to not returning since the last visit.Marland Kitchen

## 2022-07-15 ENCOUNTER — Ambulatory Visit: Payer: Medicare Other | Admitting: Occupational Therapy

## 2022-07-15 ENCOUNTER — Ambulatory Visit: Payer: Medicare Other | Admitting: Physical Therapy

## 2022-07-16 ENCOUNTER — Ambulatory Visit: Payer: Medicare Other

## 2022-07-16 DIAGNOSIS — R2681 Unsteadiness on feet: Secondary | ICD-10-CM | POA: Diagnosis not present

## 2022-07-16 DIAGNOSIS — R293 Abnormal posture: Secondary | ICD-10-CM | POA: Diagnosis not present

## 2022-07-16 DIAGNOSIS — M6281 Muscle weakness (generalized): Secondary | ICD-10-CM | POA: Diagnosis not present

## 2022-07-16 DIAGNOSIS — R278 Other lack of coordination: Secondary | ICD-10-CM | POA: Diagnosis not present

## 2022-07-16 DIAGNOSIS — I69354 Hemiplegia and hemiparesis following cerebral infarction affecting left non-dominant side: Secondary | ICD-10-CM

## 2022-07-16 DIAGNOSIS — R2689 Other abnormalities of gait and mobility: Secondary | ICD-10-CM

## 2022-07-16 NOTE — Therapy (Signed)
OUTPATIENT PHYSICAL THERAPY TREATMENT NOTE   Patient Name: Barry [name] MRN: [id number] DOB:1933-07-28, 88 y.o., male Today's Date: 07/16/2022  PCP:  Jenna Luo, MD REFERRING PROVIDER: Penni Bombard, MD   END OF SESSION:   PT End of Session - 07/16/22 1352     Visit Number 9    Number of Visits 16    Date for PT Re-Evaluation 07/30/22    Authorization Type Medicare/[employer]    Progress Note Due on Visit 10    PT Start Time 9191    PT Stop Time 1443    PT Time Calculation (min) 50 min    Equipment Utilized During Treatment Gait belt    Activity Tolerance Patient tolerated treatment well    Behavior During Therapy [facility] for tasks assessed/performed                 Past Medical History:  Diagnosis Date   AAA (abdominal aortic aneurysm) ([place]) 12/17/2007   4.7 cm   Allergy    rhinitis   Anxiety    Cancer ([place])    skin   Cataract    Depression    Diabetes mellitus    Diverticulosis    GERD (gastroesophageal reflux disease)    Hepatitis    Hx of colonic polyps 08/26/2015   Hyperlipidemia    Hypertension    Nephrolithiasis    Personal history of colonic polyps    adenomas, serrated also   PVD (peripheral vascular disease) ([place])    Stroke ([place])    Tobacco abuse    Past Surgical History:  Procedure Laterality Date   COLONOSCOPY  2006, 2008, 2010, 05/27/2011   numerous adenomas - 13 in 2006, 3, 2008, 2 2012 (up to 1 cm), 4 diminutive adenomas, serrated adenomas 2012. Diverticulosis and hemorrhoids also.   EYE SURGERY     EYE SURGERY Left    Kidney stone operation     lower limb amputation, other toe 5th     percutaneous stent graft repair of infrarenal AAA  11/10   5.6 cm (T. early)   surgical excision basal cell carcinoma     TEE WITHOUT CARDIOVERSION N/A 07/29/2021   Procedure: TRANSESOPHAGEAL ECHOCARDIOGRAM (TEE);  Surgeon: Geralynn Rile, MD;  Location: [facility];  Service: Cardiovascular;  Laterality: N/A;   tympanic eardrum  repair     VASECTOMY     Patient Active Problem List   Diagnosis Date Noted   Right thalamic infarction ([place]) 07/31/2021   Stage 3a chronic kidney disease ([place])    Benign essential HTN    Left hemiparesis ([place])    Type 2 diabetes mellitus with hyperglycemia, without long-term current use of insulin ([place])    CVA (cerebral vascular accident) ([place]) 07/27/2021   Frequent PVCs 03/20/2020   Aortic stenosis 03/20/2020   COPD (chronic obstructive pulmonary disease) ([place]) 03/20/2020   Constipation 04/30/2019   Protein-calorie malnutrition ([place]) 04/30/2019   Anxiety with depression 12/05/2018   Seasonal and perennial allergic rhinitis 08/09/2018   Carotid artery disease ([place]) 11/18/2017   Hx of colonic polyps 08/26/2015   Erectile dysfunction 02/17/2015   AAA (abdominal aortic aneurysm) without rupture ([place]) 05/21/2014   Chest pain 01/31/2014   CAD (coronary artery disease) 12/24/2013   HEARING LOSS, CONDUCTIVE, LEFT 02/23/2010   DIZZINESS, CHRONIC 05/28/2008   PULMONARY NODULE 09/07/2007   Diabetes mellitus type II, controlled ([place]) 09/06/2007   Hyperlipidemia 09/06/2007   TOBACCO ABUSE 09/06/2007   Essential hypertension 09/06/2007   Allergic rhinitis 09/06/2007  GERD 09/06/2007   BASAL CELL CARCINOMA, NOSE 09/06/2007    REFERRING DIAG:  X09.407 (ICD-10-CM) - Cerebrovascular accident (CVA) due to bilateral embolism of posterior cerebral arteries (HCC)  G81.94 (ICD-10-CM) - Left hemiparesis ([place])    THERAPY DIAG:  Other lack of coordination  Abnormal posture  Other abnormalities of gait and mobility  Muscle weakness (generalized)  Unsteadiness on feet  Hemiplegia and hemiparesis following cerebral infarction affecting left non-dominant side (HCC)  Rationale for Evaluation and Treatment Rehabilitation  PERTINENT HISTORY: PMH:  AAA, anxiety, depression, skin cancer, DM, GERD, HLD, HTN, CVA, CAD, COPD  PRECAUTIONS:  Fall  SUBJECTIVE: Patient reports doing fair. Still  some dizziness upon standing, states he has "vertigo." Doesn't describe it as a spinning sensation, more that he's just going to fall over. No falls or near falls.   PAIN:  Are you having pain? No   TODAY'S TREATMENT:   NMR: -BP seated: 147/67 -BP standing: 143/66 -STS x5 with MinA/CGA-> L LE on 4" step to bias L LE x5-> red theraband proximal to knees for improved hip abductor activation STS x5 -Minisquats at counter 3x12 with BUE + CGA -NuStep level 3 BUE/LE x8 mins  GAIT: -38f x4 with RW + CGA + verbal cues for improved L glute and quad activation during stance phase  HOME EXERCISE PROGRAM  Access Code: [email or web address] URL: [email or web address] Date: 07/08/2022 Prepared by: [facility]  Exercises - Sit to Stand with Resistance Around Legs  - 1 x daily - 7 x weekly - 3 sets - 10 reps - Seated Hip Abduction with Resistance  - 1 x daily - 5 x weekly - 2 sets - 10 reps - Seated Ankle Dorsiflexion with Resistance  - 1 x daily - 5 x weekly - 2 sets - 10 reps - Side to side weightshift  - 1 x daily - 5 x weekly - 1-2 sets - 10 reps - Supine Bridge  - 1 x daily - 5 x weekly - 2 sets - 10 reps - 3 sec hold - Hooklying Clamshell with Resistance  - 1 x daily - 5 x weekly - 1-2 sets - 10 reps - Short Arc Quad with Ankle Weight  - 1 x daily - 5 x weekly - 1-2 sets - 10 reps   PATIENT EDUCATION: Education details: sit <> stand from wheelchair with RW, HEP modifications Person educated: Patient Education method: Explanation, Demonstration, Tactile cues, Verbal cues, and Handouts Education comprehension: verbalized understanding, returned demonstration, verbal cues required, and needs further education     GOALS: Goals reviewed with patient? Yes   SHORT TERM GOALS: Target date: 07/02/2022   Pt will be supervision with HEP for improved strength, balance, gait. Baseline: Goal status: MET   2.  Pt will improve 5x sit<>stand to less than or  equal to 20 sec to demonstrate improved functional strength and transfer efficiency.  Baseline: 27.97 sec with definite UE support; 07/05/22 26.24 sec with B armrest support Goal status: NOT MET   3.  Pt will improve Berg score to at least 22/56 to decrease fall risk. Baseline: 14/56>18/56 07/08/2022 Goal status: NOT MET     LONG TERM GOALS: Target date: 07/30/2022   Pt will be independent with HEP for improved strength, balance, transfers, and gait. Baseline:  Goal status: IN PROGRESS   2.  Pt will improve 5x sit<>stand to less than or equal to 15 sec to demonstrate improved functional strength and transfer efficiency. Baseline:  27.97 sec BUE support at eval Goal status: IN PROGRESS   3.  Pt will improve TUG score to less than or equal to 20 sec for decreased fall risk. Baseline: 39.12 sec with RW Goal status: IN PROGRESS   4.  Pt will improve gait velocity to at least 1.8 ft/sec for improved gait efficiency and safety. Baseline: 0.99 ft/sec Goal status: IN PROGRESS    5.  Pt will improve Berg score to at least 32/56 to decrease fall risk. Baseline: 14/56 Goal status: IN PROGRESS   ASSESSMENT:   CLINICAL IMPRESSION: Patient seen for skilled PT session with emphasis on gait training and L NMR. Patient able to ambulate household distance with RW + CGA/MinA increasing assist with increasing fatigue. Limited full L knee extension in stance phase (?quad strength vs hamstring length). Would benefit from further L LE functional strengthening and progression of complexity in HEP as he tolerates. Continue POC.    OBJECTIVE IMPAIRMENTS Abnormal gait, decreased balance, decreased coordination, decreased mobility, difficulty walking, decreased ROM, decreased strength, decreased safety awareness, dizziness, impaired flexibility, impaired tone, and postural dysfunction.    ACTIVITY LIMITATIONS sitting, standing, stairs, transfers, and locomotion level   PARTICIPATION LIMITATIONS: driving,  community activity, and living independently   [place] Time since onset of injury/illness/exacerbation and 3+ comorbidities: see above  are also affecting patient's functional outcome.    REHAB POTENTIAL: Good   CLINICAL DECISION MAKING: Evolving/moderate complexity   EVALUATION COMPLEXITY: Moderate   PLAN: PT FREQUENCY: 2x/week   PT DURATION: 8 weeks, including eval week   PLANNED INTERVENTIONS: Therapeutic exercises, Therapeutic activity, Neuromuscular re-education, Balance training, Gait training, Patient/Family education, Self Care, Stair training, Vestibular training, Canalith repositioning, DME instructions, and Manual therapy   PLAN FOR NEXT SESSION:  Continue work on gluts, hamstrings, quads; work on [place], weightbearing through LLE with standing, transfers, wider BOS with gait, progressing household gait with RW   Debbora Dus, PT, DPT, CBIS 07/16/22 2:49 PM

## 2022-07-17 DIAGNOSIS — F32A Depression, unspecified: Secondary | ICD-10-CM

## 2022-07-17 DIAGNOSIS — E1169 Type 2 diabetes mellitus with other specified complication: Secondary | ICD-10-CM

## 2022-07-17 DIAGNOSIS — I10 Essential (primary) hypertension: Secondary | ICD-10-CM

## 2022-07-17 DIAGNOSIS — E785 Hyperlipidemia, unspecified: Secondary | ICD-10-CM

## 2022-07-17 DIAGNOSIS — Z7984 Long term (current) use of oral hypoglycemic drugs: Secondary | ICD-10-CM

## 2022-07-17 DIAGNOSIS — I2510 Atherosclerotic heart disease of native coronary artery without angina pectoris: Secondary | ICD-10-CM | POA: Diagnosis not present

## 2022-07-19 ENCOUNTER — Ambulatory Visit: Payer: Medicare Other | Admitting: Physical Therapy

## 2022-07-19 ENCOUNTER — Encounter: Payer: Medicare Other | Admitting: Occupational Therapy

## 2022-07-20 ENCOUNTER — Ambulatory Visit: Payer: Medicare Other | Attending: Diagnostic Neuroimaging

## 2022-07-20 DIAGNOSIS — R278 Other lack of coordination: Secondary | ICD-10-CM | POA: Insufficient documentation

## 2022-07-20 DIAGNOSIS — M6281 Muscle weakness (generalized): Secondary | ICD-10-CM | POA: Insufficient documentation

## 2022-07-20 DIAGNOSIS — R2689 Other abnormalities of gait and mobility: Secondary | ICD-10-CM | POA: Diagnosis not present

## 2022-07-20 DIAGNOSIS — R2681 Unsteadiness on feet: Secondary | ICD-10-CM | POA: Diagnosis not present

## 2022-07-20 DIAGNOSIS — R293 Abnormal posture: Secondary | ICD-10-CM | POA: Diagnosis not present

## 2022-07-20 NOTE — Therapy (Signed)
OUTPATIENT PHYSICAL THERAPY TREATMENT NOTE/ PROGRESS NOTE   Patient Name: Barry [name] MRN: [id number] DOB:07-Feb-1933, 88 y.o., male Today's Date: 07/20/2022  PCP:  Jenna Luo, MD REFERRING PROVIDER: Penni Bombard, MD   Physical Therapy Progress Note   Dates of Reporting Period:06/07/22- 07/20/22  See Note below for Objective Data and Assessment of Progress/Goals.  Thank you for the referral of this patient. Debbora Dus, PT, DPT, CBIS   END OF SESSION:   PT End of Session - 07/20/22 1405     Visit Number 10    Number of Visits 16    Date for PT Re-Evaluation 07/30/22    Authorization Type Medicare/[employer]    Progress Note Due on Visit 20    PT Start Time 1400    PT Stop Time 1440    PT Time Calculation (min) 40 min    Equipment Utilized During Treatment Gait belt    Activity Tolerance Patient tolerated treatment well    Behavior During Therapy [facility] for tasks assessed/performed                 Past Medical History:  Diagnosis Date   AAA (abdominal aortic aneurysm) ([place]) 12/17/2007   4.7 cm   Allergy    rhinitis   Anxiety    Cancer ([place])    skin   Cataract    Depression    Diabetes mellitus    Diverticulosis    GERD (gastroesophageal reflux disease)    Hepatitis    Hx of colonic polyps 08/26/2015   Hyperlipidemia    Hypertension    Nephrolithiasis    Personal history of colonic polyps    adenomas, serrated also   PVD (peripheral vascular disease) ([place])    Stroke ([place])    Tobacco abuse    Past Surgical History:  Procedure Laterality Date   COLONOSCOPY  2006, 2008, 2010, 05/27/2011   numerous adenomas - 13 in 2006, 3, 2008, 2 2012 (up to 1 cm), 4 diminutive adenomas, serrated adenomas 2012. Diverticulosis and hemorrhoids also.   EYE SURGERY     EYE SURGERY Left    Kidney stone operation     lower limb amputation, other toe 5th     percutaneous stent graft repair of infrarenal AAA  11/10   5.6 cm (T. early)   surgical  excision basal cell carcinoma     TEE WITHOUT CARDIOVERSION N/A 07/29/2021   Procedure: TRANSESOPHAGEAL ECHOCARDIOGRAM (TEE);  Surgeon: Geralynn Rile, MD;  Location: [facility];  Service: Cardiovascular;  Laterality: N/A;   tympanic eardrum repair     VASECTOMY     Patient Active Problem List   Diagnosis Date Noted   Right thalamic infarction ([place]) 07/31/2021   Stage 3a chronic kidney disease ([place])    Benign essential HTN    Left hemiparesis ([place])    Type 2 diabetes mellitus with hyperglycemia, without long-term current use of insulin ([place])    CVA (cerebral vascular accident) ([place]) 07/27/2021   Frequent PVCs 03/20/2020   Aortic stenosis 03/20/2020   COPD (chronic obstructive pulmonary disease) ([place]) 03/20/2020   Constipation 04/30/2019   Protein-calorie malnutrition ([place]) 04/30/2019   Anxiety with depression 12/05/2018   Seasonal and perennial allergic rhinitis 08/09/2018   Carotid artery disease ([place]) 11/18/2017   Hx of colonic polyps 08/26/2015   Erectile dysfunction 02/17/2015   AAA (abdominal aortic aneurysm) without rupture ([place]) 05/21/2014   Chest pain 01/31/2014   CAD (coronary artery disease) 12/24/2013   HEARING LOSS,  CONDUCTIVE, LEFT 02/23/2010   DIZZINESS, CHRONIC 05/28/2008   PULMONARY NODULE 09/07/2007   Diabetes mellitus type II, controlled ([place]) 09/06/2007   Hyperlipidemia 09/06/2007   TOBACCO ABUSE 09/06/2007   Essential hypertension 09/06/2007   Allergic rhinitis 09/06/2007   GERD 09/06/2007   BASAL CELL CARCINOMA, NOSE 09/06/2007    REFERRING DIAG:  V37.106 (ICD-10-CM) - Cerebrovascular accident (CVA) due to bilateral embolism of posterior cerebral arteries (HCC)  G81.94 (ICD-10-CM) - Left hemiparesis ([place])    THERAPY DIAG:  Other lack of coordination  Abnormal posture  Other abnormalities of gait and mobility  Muscle weakness (generalized)  Unsteadiness on feet  Rationale for Evaluation and Treatment Rehabilitation  PERTINENT HISTORY:  PMH:  AAA, anxiety, depression, skin cancer, DM, GERD, HLD, HTN, CVA, CAD, COPD  PRECAUTIONS:  Fall  SUBJECTIVE: Patient reports doing well. Dizziness has been about the same. Patient asking again about brace to prevent L knee from buckling. PT informing patient that there is no brace that would do that without exclusively maintaining L knee in extension throughout gait.   PAIN:  Are you having pain? No   TODAY'S TREATMENT:   NMR: -NuStep x10 mins level 3 B UE/LE  [facility] PT Assessment - 07/20/22 0001       Standardized Balance Assessment   Five times sit to stand comments  20   B UE + MinA and RW     Berg Balance Test   Sit to Stand Able to stand  independently using hands    Standing Unsupported Able to stand 30 seconds unsupported    Sitting with Back Unsupported but Feet Supported on Floor or Stool Able to sit safely and securely 2 minutes    Stand to Sit Controls descent by using hands    Transfers Needs one person to assist    Standing Unsupported with Eyes Closed Able to stand 3 seconds    Standing Unsupported with Feet Together Needs help to attain position but able to stand for 30 seconds with feet together    From Standing, Reach Forward with Outstretched Arm Reaches forward but needs supervision    From Standing Position, Pick up Object from Floor Unable to try/needs assist to keep balance    From Standing Position, Turn to Look Behind Over each Shoulder Needs supervision when turning    Turn 360 Degrees Needs assistance while turning    Standing Unsupported, Alternately Place Feet on Step/Stool Needs assistance to keep from falling or unable to try    Standing Unsupported, One Foot in Front Needs help to step but can hold 15 seconds    Standing on One Leg Unable to try or needs assist to prevent fall    Total Score 19           -increased dizziness reported with BBS(BP: 162/64)  -VOR x1 seated horizontal/vertical 3x30s  -horizontal: 1/5  -vertical: 2/5  -seated  picking object up off the floor x3 with slight increase in dizziness when bending to the L Vestibular Asssessment  Vestibular-Ocular Reflex (VOR):   Slow VOR: Positive Left    Head-Impulse Test: HIT Right: negative HIT Left: positive   Motion Sensitivity:   Motion Sensitivity Quotient  Intensity: 0 = none, 1 = Lightheaded, 2 = Mild, 3 = Moderate, 4 = Severe, 5 = Vomiting Duration: < 5 s = 0, 5-10s = 1,11-30s = 2, >30s = 3 Score = Intensity + duration     Intensity Duration Score  1. Sitting to supine  2. Supine to L side     3. Supine to R side     4. Supine to sitting     5. L Hallpike-Dix     6. Up from L      7. R Hallpike-Dix     8. Up from R      9. Sitting, head  tipped to L knee 1    10. Head up from L  knee 1    11. Sitting, head  tipped to R knee 1    12. Head up from R  knee 1    13. Sitting head turns x5 1    14.Sitting head nods x5 2    15. In stance, 180  turn to L      16. In stance, 180  turn to R      MSQ = Total score  (# of positions) / 20.48 MSQ = __________________  0-10 mild; 11-30 moderate; 31-100 severe      HOME EXERCISE PROGRAM  Access Code: [email or web address] URL: [email or web address] Date: 07/08/2022 Prepared by: [facility]  Exercises - Sit to Stand with Resistance Around Legs  - 1 x daily - 7 x weekly - 3 sets - 10 reps - Seated Hip Abduction with Resistance  - 1 x daily - 5 x weekly - 2 sets - 10 reps - Seated Ankle Dorsiflexion with Resistance  - 1 x daily - 5 x weekly - 2 sets - 10 reps - Side to side weightshift  - 1 x daily - 5 x weekly - 1-2 sets - 10 reps - Supine Bridge  - 1 x daily - 5 x weekly - 2 sets - 10 reps - 3 sec hold - Hooklying Clamshell with Resistance  - 1 x daily - 5 x weekly - 1-2 sets - 10 reps - Short Arc Quad with Ankle Weight  - 1 x daily - 5 x weekly - 1-2 sets - 10 reps   Gaze Stabilization: Sitting    Keeping eyes on target on wall 5 feet away,  tilt head down 15-30 and move head side to side for __30__ seconds. Repeat while moving head up and down for __30__ seconds. Do __3__ sessions per day. Repeat using target on pattern background.  PATIENT EDUCATION: Education details: HEP, vestib assessment findings Person educated: Patient Education method: Explanation, Demonstration, Tactile cues, Verbal cues, and Handouts Education comprehension: verbalized understanding, returned demonstration, verbal cues required, and needs further education     GOALS: Goals reviewed with patient? Yes   SHORT TERM GOALS: Target date: 07/02/2022   Pt will be supervision with HEP for improved strength, balance, gait. Baseline: Goal status: MET   2.  Pt will improve 5x sit<>stand to less than or equal to 20 sec to demonstrate improved functional strength and transfer efficiency.  Baseline: 27.97 sec with definite UE support; 07/05/22 26.24 sec with B armrest support Goal status: NOT MET   3.  Pt will improve Berg score to at least 22/56 to decrease fall risk. Baseline: 14/56>18/56 07/08/2022 Goal status: NOT MET     LONG TERM GOALS: Target date: 07/30/2022   Pt will be independent with HEP for improved strength, balance, transfers, and gait. Baseline:  Goal status: IN PROGRESS   2.  Pt will improve 5x sit<>stand to less than or equal to 15 sec to demonstrate improved functional strength and transfer efficiency. Baseline: 27.97 sec BUE support at eval Goal status:  IN PROGRESS   3.  Pt will improve TUG score to less than or equal to 20 sec for decreased fall risk. Baseline: 39.12 sec with RW Goal status: IN PROGRESS   4.  Pt will improve gait velocity to at least 1.8 ft/sec for improved gait efficiency and safety. Baseline: 0.99 ft/sec Goal status: IN PROGRESS    5.  Pt will improve Berg score to at least 32/56 to decrease fall risk. Baseline: 14/56 Goal status: IN PROGRESS   ASSESSMENT:   CLINICAL IMPRESSION: Patient seen for  skilled PT session with emphasis on L NMR and vestib retraining. Patient with (+) HIT L indicating L hypofunction. Able to tolerate ~30s of horizontal head turns with slight increase in dizziness. Five times Sit to Stand Test (FTSS) Method: Use a straight back chair with a solid seat that is 17-18" high. Ask participant to sit on the chair with arms folded across their chest.   Instructions: "Stand up and sit down as quickly as possible 5 times, keeping your arms folded across your chest."   Measurement: Stop timing when the participant touches the chair in sitting the 5th time.  TIME: 20 sec with BUE  Cut off scores indicative of increased fall risk: >12 sec CVA, >16 sec PD, >13 sec vestibular (ANPTA Core Set of Outcome Measures for Adults with Neurologic Conditions, 2018). Patient demonstrated increased fall risk noted by score of 19/56 on the [facility] Scale.  <45/56 = fall risk, <42/56 = predictive of recurrent falls, <40/56 = 100% fall risk  >41 = independent, 21-40 = assistive device, 0-20 = wheelchair level  MDC 6.9 (4 pts 45-56, 5 pts 35-44, 7 pts 25-34) (ANPTA Core Set of Outcome Measures for Adults with Neurologic Conditions, 2018). Continue POC.      OBJECTIVE IMPAIRMENTS Abnormal gait, decreased balance, decreased coordination, decreased mobility, difficulty walking, decreased ROM, decreased strength, decreased safety awareness, dizziness, impaired flexibility, impaired tone, and postural dysfunction.    ACTIVITY LIMITATIONS sitting, standing, stairs, transfers, and locomotion level   PARTICIPATION LIMITATIONS: driving, community activity, and living independently   [place] Time since onset of injury/illness/exacerbation and 3+ comorbidities: see above  are also affecting patient's functional outcome.    REHAB POTENTIAL: Good   CLINICAL DECISION MAKING: Evolving/moderate complexity   EVALUATION COMPLEXITY: Moderate   PLAN: PT FREQUENCY: 2x/week   PT  DURATION: 8 weeks, including eval week   PLANNED INTERVENTIONS: Therapeutic exercises, Therapeutic activity, Neuromuscular re-education, Balance training, Gait training, Patient/Family education, Self Care, Stair training, Vestibular training, Canalith repositioning, DME instructions, and Manual therapy   PLAN FOR NEXT SESSION:  Continue work on gluts, hamstrings, quads; work on [place], weightbearing through LLE with standing, transfers, wider BOS with gait, progressing household gait with RW; VOR x1    Debbora Dus, PT, DPT, CBIS 07/20/22 2:51 PM

## 2022-07-22 ENCOUNTER — Ambulatory Visit: Payer: Medicare Other | Admitting: Physical Therapy

## 2022-07-22 ENCOUNTER — Encounter: Payer: Medicare Other | Admitting: Occupational Therapy

## 2022-07-23 ENCOUNTER — Ambulatory Visit: Payer: Medicare Other

## 2022-07-23 ENCOUNTER — Encounter: Payer: Self-pay | Admitting: Family Medicine

## 2022-07-23 ENCOUNTER — Ambulatory Visit (INDEPENDENT_AMBULATORY_CARE_PROVIDER_SITE_OTHER): Payer: Medicare Other | Admitting: Family Medicine

## 2022-07-23 VITALS — BP 114/56 | HR 60 | Temp 97.5°F | Wt 177.0 lb

## 2022-07-23 DIAGNOSIS — L570 Actinic keratosis: Secondary | ICD-10-CM | POA: Diagnosis not present

## 2022-07-23 DIAGNOSIS — I2510 Atherosclerotic heart disease of native coronary artery without angina pectoris: Secondary | ICD-10-CM

## 2022-07-23 DIAGNOSIS — C44319 Basal cell carcinoma of skin of other parts of face: Secondary | ICD-10-CM | POA: Diagnosis not present

## 2022-07-23 DIAGNOSIS — R278 Other lack of coordination: Secondary | ICD-10-CM

## 2022-07-23 DIAGNOSIS — M6281 Muscle weakness (generalized): Secondary | ICD-10-CM | POA: Diagnosis not present

## 2022-07-23 DIAGNOSIS — X32XXXD Exposure to sunlight, subsequent encounter: Secondary | ICD-10-CM | POA: Diagnosis not present

## 2022-07-23 DIAGNOSIS — R2681 Unsteadiness on feet: Secondary | ICD-10-CM

## 2022-07-23 DIAGNOSIS — R2689 Other abnormalities of gait and mobility: Secondary | ICD-10-CM | POA: Diagnosis not present

## 2022-07-23 DIAGNOSIS — L218 Other seborrheic dermatitis: Secondary | ICD-10-CM | POA: Diagnosis not present

## 2022-07-23 DIAGNOSIS — R293 Abnormal posture: Secondary | ICD-10-CM | POA: Diagnosis not present

## 2022-07-23 DIAGNOSIS — M25512 Pain in left shoulder: Secondary | ICD-10-CM | POA: Diagnosis not present

## 2022-07-23 NOTE — Therapy (Signed)
OUTPATIENT PHYSICAL THERAPY TREATMENT NOTE   Patient Name: Barry [name] MRN: [id number] DOB:10-16-1933, 88 y.o., male Today's Date: 07/23/2022  PCP:  Jenna Luo, MD REFERRING PROVIDER: Penni Bombard, MD   END OF SESSION:   PT End of Session - 07/23/22 1447     Visit Number 11    Number of Visits 16    Date for PT Re-Evaluation 07/30/22    Authorization Type Medicare/[employer]    Progress Note Due on Visit 20    PT Start Time 7741    PT Stop Time 1525    PT Time Calculation (min) 40 min    Equipment Utilized During Treatment Gait belt    Activity Tolerance Patient tolerated treatment well    Behavior During Therapy [facility] for tasks assessed/performed             Past Medical History:  Diagnosis Date   AAA (abdominal aortic aneurysm) ([place]) 12/17/2007   4.7 cm   Allergy    rhinitis   Anxiety    Cancer ([place])    skin   Cataract    Depression    Diabetes mellitus    Diverticulosis    GERD (gastroesophageal reflux disease)    Hepatitis    Hx of colonic polyps 08/26/2015   Hyperlipidemia    Hypertension    Nephrolithiasis    Personal history of colonic polyps    adenomas, serrated also   PVD (peripheral vascular disease) ([place])    Stroke ([place])    Tobacco abuse    Past Surgical History:  Procedure Laterality Date   COLONOSCOPY  2006, 2008, 2010, 05/27/2011   numerous adenomas - 13 in 2006, 3, 2008, 2 2012 (up to 1 cm), 4 diminutive adenomas, serrated adenomas 2012. Diverticulosis and hemorrhoids also.   EYE SURGERY     EYE SURGERY Left    Kidney stone operation     lower limb amputation, other toe 5th     percutaneous stent graft repair of infrarenal AAA  11/10   5.6 cm (T. early)   surgical excision basal cell carcinoma     TEE WITHOUT CARDIOVERSION N/A 07/29/2021   Procedure: TRANSESOPHAGEAL ECHOCARDIOGRAM (TEE);  Surgeon: Geralynn Rile, MD;  Location: [facility];  Service: Cardiovascular;  Laterality: N/A;   tympanic eardrum repair      VASECTOMY     Patient Active Problem List   Diagnosis Date Noted   Right thalamic infarction ([place]) 07/31/2021   Stage 3a chronic kidney disease ([place])    Benign essential HTN    Left hemiparesis ([place])    Type 2 diabetes mellitus with hyperglycemia, without long-term current use of insulin ([place])    CVA (cerebral vascular accident) ([place]) 07/27/2021   Frequent PVCs 03/20/2020   Aortic stenosis 03/20/2020   COPD (chronic obstructive pulmonary disease) ([place]) 03/20/2020   Constipation 04/30/2019   Protein-calorie malnutrition ([place]) 04/30/2019   Anxiety with depression 12/05/2018   Seasonal and perennial allergic rhinitis 08/09/2018   Carotid artery disease ([place]) 11/18/2017   Hx of colonic polyps 08/26/2015   Erectile dysfunction 02/17/2015   AAA (abdominal aortic aneurysm) without rupture ([place]) 05/21/2014   Chest pain 01/31/2014   CAD (coronary artery disease) 12/24/2013   HEARING LOSS, CONDUCTIVE, LEFT 02/23/2010   DIZZINESS, CHRONIC 05/28/2008   PULMONARY NODULE 09/07/2007   Diabetes mellitus type II, controlled ([place]) 09/06/2007   Hyperlipidemia 09/06/2007   TOBACCO ABUSE 09/06/2007   Essential hypertension 09/06/2007   Allergic rhinitis 09/06/2007   GERD 09/06/2007  BASAL CELL CARCINOMA, NOSE 09/06/2007    REFERRING DIAG:  X28.118 (ICD-10-CM) - Cerebrovascular accident (CVA) due to bilateral embolism of posterior cerebral arteries (HCC)  G81.94 (ICD-10-CM) - Left hemiparesis (HCC)    THERAPY DIAG:  Other lack of coordination  Abnormal posture  Other abnormalities of gait and mobility  Muscle weakness (generalized)  Unsteadiness on feet  Rationale for Evaluation and Treatment Rehabilitation  PERTINENT HISTORY: PMH:  AAA, anxiety, depression, skin cancer, DM, GERD, HLD, HTN, CVA, CAD, COPD  PRECAUTIONS:  Fall  SUBJECTIVE: Patient reports doing fair. Is very tired from multiple drs appts today. Denies falls/near falls.   PAIN:  Are you having pain?  No   TODAY'S TREATMENT:   NMR: -NuStep x10 mins level 3 B UE/LE -VOR x1 horizontal 3x30s (increased eye slippage from target with head turn L)  -VOR x1 vertical 3x30s (no eye slippage noted)  -hooklying bridges 3x12 -hooklying marches 3x12 -sit <> stand x5 with RW + MinA and verbal cues for full B knee extension    HOME EXERCISE PROGRAM  Access Code: [email or web address] URL: [email or web address] Date: 07/08/2022 Prepared by: [facility]  Exercises - Sit to Stand with Resistance Around Legs  - 1 x daily - 7 x weekly - 3 sets - 10 reps - Seated Hip Abduction with Resistance  - 1 x daily - 5 x weekly - 2 sets - 10 reps - Seated Ankle Dorsiflexion with Resistance  - 1 x daily - 5 x weekly - 2 sets - 10 reps - Side to side weightshift  - 1 x daily - 5 x weekly - 1-2 sets - 10 reps - Supine Bridge  - 1 x daily - 5 x weekly - 2 sets - 10 reps - 3 sec hold - Hooklying Clamshell with Resistance  - 1 x daily - 5 x weekly - 1-2 sets - 10 reps - Short Arc Quad with Ankle Weight  - 1 x daily - 5 x weekly - 1-2 sets - 10 reps   Gaze Stabilization: Sitting    Keeping eyes on target on wall 5 feet away, tilt head down 15-30 and move head side to side for __30__ seconds. Repeat while moving head up and down for __30__ seconds. Do __3__ sessions per day. Repeat using target on pattern background.  PATIENT EDUCATION: Education details: HEP, vestib assessment findings Person educated: Patient Education method: Explanation, Demonstration, Tactile cues, Verbal cues, and Handouts Education comprehension: verbalized understanding, returned demonstration, verbal cues required, and needs further education     GOALS: Goals reviewed with patient? Yes   SHORT TERM GOALS: Target date: 07/02/2022   Pt will be supervision with HEP for improved strength, balance, gait. Baseline: Goal status: MET   2.  Pt will improve 5x sit<>stand to less than or equal to  20 sec to demonstrate improved functional strength and transfer efficiency.  Baseline: 27.97 sec with definite UE support; 07/05/22 26.24 sec with B armrest support Goal status: NOT MET   3.  Pt will improve Berg score to at least 22/56 to decrease fall risk. Baseline: 14/56>18/56 07/08/2022 Goal status: NOT MET     LONG TERM GOALS: Target date: 07/30/2022   Pt will be independent with HEP for improved strength, balance, transfers, and gait. Baseline:  Goal status: IN PROGRESS   2.  Pt will improve 5x sit<>stand to less than or equal to 15 sec to demonstrate improved functional strength and transfer efficiency. Baseline: 27.97 sec  BUE support at eval Goal status: IN PROGRESS   3.  Pt will improve TUG score to less than or equal to 20 sec for decreased fall risk. Baseline: 39.12 sec with RW Goal status: IN PROGRESS   4.  Pt will improve gait velocity to at least 1.8 ft/sec for improved gait efficiency and safety. Baseline: 0.99 ft/sec Goal status: IN PROGRESS    5.  Pt will improve Berg score to at least 32/56 to decrease fall risk. Baseline: 14/56 Goal status: IN PROGRESS   ASSESSMENT:   CLINICAL IMPRESSION: Patient seen for skilled PT session with emphasis on L NMR and vestib retraining. He tolerated VOR exercises well with increased cuing to complete task accurately. Continues to demonstrate B flexed knees in stance, but able to achieve full functional knee extension with verbal cues to do so. Would benefit from B LE endurance. Continue POC.      OBJECTIVE IMPAIRMENTS Abnormal gait, decreased balance, decreased coordination, decreased mobility, difficulty walking, decreased ROM, decreased strength, decreased safety awareness, dizziness, impaired flexibility, impaired tone, and postural dysfunction.    ACTIVITY LIMITATIONS sitting, standing, stairs, transfers, and locomotion level   PARTICIPATION LIMITATIONS: driving, community activity, and living independently   [place] Time since onset of injury/illness/exacerbation and 3+ comorbidities: see above  are also affecting patient's functional outcome.    REHAB POTENTIAL: Good   CLINICAL DECISION MAKING: Evolving/moderate complexity   EVALUATION COMPLEXITY: Moderate   PLAN: PT FREQUENCY: 2x/week   PT DURATION: 8 weeks, including eval week   PLANNED INTERVENTIONS: Therapeutic exercises, Therapeutic activity, Neuromuscular re-education, Balance training, Gait training, Patient/Family education, Self Care, Stair training, Vestibular training, Canalith repositioning, DME instructions, and Manual therapy   PLAN FOR NEXT SESSION:  Continue work on gluts, hamstrings, quads; work on Diaz, weightbearing through LLE with standing, transfers, wider BOS with gait, progressing household gait with RW; VOR x1 , endurance   Debbora Dus, PT, DPT, CBIS 07/23/22 3:32 PM

## 2022-07-23 NOTE — Progress Notes (Signed)
Subjective:    Patient ID: Barry [name], male    DOB: Apr 07, 1933, 88 y.o.   MRN: [id number]  Shoulder Pain   Dizziness  Patient had a cortisone injection in his left shoulder in March.  He is asking for repeat cortisone injection in his left shoulder.  He complains of an aching throbbing pain at night while trying to sleep.  He has pain with range of motion. Past Medical History:  Diagnosis Date   AAA (abdominal aortic aneurysm) ([place]) 12/17/2007   4.7 cm   Allergy    rhinitis   Anxiety    Cancer ([place])    skin   Cataract    Depression    Diabetes mellitus    Diverticulosis    GERD (gastroesophageal reflux disease)    Hepatitis    Hx of colonic polyps 08/26/2015   Hyperlipidemia    Hypertension    Nephrolithiasis    Personal history of colonic polyps    adenomas, serrated also   PVD (peripheral vascular disease) ([place])    Stroke ([place])    Tobacco abuse    Past Surgical History:  Procedure Laterality Date   COLONOSCOPY  2006, 2008, 2010, 05/27/2011   numerous adenomas - 13 in 2006, 3, 2008, 2 2012 (up to 1 cm), 4 diminutive adenomas, serrated adenomas 2012. Diverticulosis and hemorrhoids also.   EYE SURGERY     EYE SURGERY Left    Kidney stone operation     lower limb amputation, other toe 5th     percutaneous stent graft repair of infrarenal AAA  11/10   5.6 cm (T. early)   surgical excision basal cell carcinoma     TEE WITHOUT CARDIOVERSION N/A 07/29/2021   Procedure: TRANSESOPHAGEAL ECHOCARDIOGRAM (TEE);  Surgeon: Geralynn Rile, MD;  Location: [facility];  Service: Cardiovascular;  Laterality: N/A;   tympanic eardrum repair     VASECTOMY     Current Outpatient Medications on File Prior to Visit  Medication Sig Dispense Refill   acetaminophen (TYLENOL) 325 MG tablet Take 2 tablets (650 mg total) by mouth every 6 (six) hours as needed for mild pain (or Fever >/= 101).     amLODipine (NORVASC) 10 MG tablet Take 1 tablet (10 mg total) by mouth daily. 90 tablet  3   artificial tears (LACRILUBE) OINT ophthalmic ointment Place into both eyes every 4 (four) hours as needed for dry eyes.     B Complex-C (B-COMPLEX WITH VITAMIN C) tablet Take 1 tablet by mouth daily.     buPROPion (WELLBUTRIN XL) 150 MG 24 hr tablet TAKE 1 TABLET (150 MG TOTAL) BY MOUTH DAILY. 30 tablet 2   clonazePAM (KLONOPIN) 0.5 MG tablet TAKE 1/2 TABLET BY MOUTH 3 TIMES DAILY AS NEEDED FOR ANXIETY. 60 tablet 1   clopidogrel (PLAVIX) 75 MG tablet Take 1 tablet (75 mg total) by mouth daily. 90 tablet 3   diclofenac Sodium (VOLTAREN) 1 % GEL Apply 2 g topically 4 (four) times daily.     ezetimibe (ZETIA) 10 MG tablet TAKE 1 TABLET BY MOUTH DAILY 90 tablet 3   FLUoxetine (PROZAC) 10 MG capsule Take 1 capsule (10 mg total) by mouth daily. 90 capsule 2   FLUoxetine (PROZAC) 20 MG capsule TAKE (1) CAPSULE BY MOUTH EACH MORNING. 90 capsule 2   fluticasone (FLONASE) 50 MCG/ACT nasal spray Place 2 sprays into both nostrils daily. 16 g 6   gabapentin (NEURONTIN) 300 MG capsule Take 1 capsule (300 mg total) by mouth 2 (  two) times daily as needed (nerve pain). 60 capsule 3   glipiZIDE (GLIPIZIDE XL) 5 MG 24 hr tablet Take 1 tablet (5 mg total) by mouth daily with breakfast. 90 tablet 3   hydrALAZINE (APRESOLINE) 100 MG tablet TAKE 1 TABLET BY MOUTH EVERY 8 HOURS. 90 tablet 7   JANUVIA 100 MG tablet TAKE 1 TABLET (100 MG TOTAL) BY MOUTH DAILY. 30 tablet 3   magnesium gluconate (MAGONATE) 500 MG tablet Take 0.5 tablets (250 mg total) by mouth at bedtime.     metoprolol succinate (TOPROL-XL) 25 MG 24 hr tablet Take 0.5 tablets (12.5 mg total) by mouth daily. 90 tablet 3   montelukast (SINGULAIR) 10 MG tablet TAKE 1 TABLET (10 MG TOTAL) BY MOUTH AT BEDTIME. 90 tablet 1   rOPINIRole (REQUIP) 0.25 MG tablet Take 1 tablet (0.25 mg total) by mouth at bedtime. 90 tablet 3   rosuvastatin (CRESTOR) 20 MG tablet Take 1 tablet (20 mg total) by mouth daily. 90 tablet 3   sulfamethoxazole-trimethoprim (BACTRIM DS)  800-160 MG tablet Take 1 tablet by mouth 2 (two) times daily. 20 tablet 0   valsartan (DIOVAN) 160 MG tablet Take 1 tablet (160 mg total) by mouth 2 (two) times daily. 180 tablet 3   vitamin B-12 (CYANOCOBALAMIN) 1000 MCG tablet Take 1,000 mcg by mouth daily.     No current facility-administered medications on file prior to visit.   Allergies  Allergen Reactions   Lisinopril-Hydrochlorothiazide Swelling    ZESTRIL   Doxycycline Rash   Social History   Socioeconomic History   Marital status: Widowed    Spouse name: Not on file   Number of children: 2   Years of education: Not on file   Highest education level: Not on file  Occupational History    Employer: RETIRED  Tobacco Use   Smoking status: Former    Packs/day: 1.00    Years: 60.00    Total pack years: 60.00    Types: Cigarettes   Smokeless tobacco: Never   Tobacco comments:    Hasn't smoked since he had his stroke   Vaping Use   Vaping Use: Never used  Substance and Sexual Activity   Alcohol use: No   Drug use: No   Sexual activity: Yes    Partners: Female    Birth control/protection: Surgical    Comment: vasectomy  Other Topics Concern   Not on file  Social History Narrative   11/11/21 living with son, [name]   [employer], 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 retired 77 years - keeping busy. Lives alone. 1- step-daughter, 1 son - '68; 3 grandchildren. 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  (12/25/2021)   Overall Financial Resource Strain (CARDIA)    Difficulty of Paying Living Expenses: Not hard at all  Food Insecurity: No Food Insecurity (12/25/2021)   Hunger Vital Sign    Worried About Running Out of Food in the Last Year: Never true    [place] in the Last Year: Never true  Transportation Needs: No Transportation Needs (12/25/2021)   PRAPARE - Hydrologist (Medical): No    Lack of Transportation (Non-Medical): No   Physical Activity: Sufficiently Active (12/25/2021)   Exercise Vital Sign    Days of Exercise per Week: 3 days    Minutes of Exercise per Session: 60 min  Stress: Stress Concern Present (12/25/2021)   [place] -  Occupational Stress Questionnaire    Feeling of Stress : To some extent  Social Connections: Moderately Isolated (12/25/2021)   Social Connection and Isolation Panel [NHANES]    Frequency of Communication with Friends and Family: More than three times a week    Frequency of Social Gatherings with Friends and Family: More than three times a week    Attends Religious Services: 1 to 4 times per year    Active Member of [employer] or Organizations: No    Attends Archivist Meetings: Never    Marital Status: Widowed  Intimate Partner Violence: Not At Risk (12/25/2021)   Humiliation, Afraid, Rape, and Kick questionnaire    Fear of Current or Ex-Partner: No    Emotionally Abused: No    Physically Abused: No    Sexually Abused: No     Review of Systems  Neurological:  Positive for dizziness.  All other systems reviewed and are negative.      Objective:   Physical Exam Vitals reviewed.  Constitutional:      General: He is not in acute distress.    Appearance: He is not toxic-appearing.  Cardiovascular:     Rate and Rhythm: Normal rate.     Heart sounds: Murmur heard.  Pulmonary:     Effort: Pulmonary effort is normal.     Breath sounds: Normal breath sounds. No wheezing.  Musculoskeletal:     Left shoulder: Tenderness and crepitus present. No bony tenderness. Decreased range of motion.     Right lower leg: No edema.     Left lower leg: No edema.  Neurological:     General: No focal deficit present.     Mental Status: He is alert and oriented to person, place, and time.     Cranial Nerves: No cranial nerve deficit.     Sensory: No sensory deficit.     Coordination: Coordination normal.     Gait: Gait abnormal.     Deep Tendon  Reflexes: Reflexes normal.           Assessment & Plan:  Acute pain of left shoulder I believe the patient has chronic bursitis in the shoulder as well as supraspinatus tendinopathy.  Using sterile technique, I injected his left shoulder with 2 cc lidocaine, 2 cc of Marcaine, and 2 cc of 40 mg/mL Kenalog.

## 2022-07-26 ENCOUNTER — Encounter: Payer: Self-pay | Admitting: Family Medicine

## 2022-07-26 ENCOUNTER — Other Ambulatory Visit: Payer: Self-pay | Admitting: Family Medicine

## 2022-07-26 DIAGNOSIS — R42 Dizziness and giddiness: Secondary | ICD-10-CM

## 2022-07-27 ENCOUNTER — Ambulatory Visit: Payer: Medicare Other

## 2022-07-27 DIAGNOSIS — R293 Abnormal posture: Secondary | ICD-10-CM | POA: Diagnosis not present

## 2022-07-27 DIAGNOSIS — R2689 Other abnormalities of gait and mobility: Secondary | ICD-10-CM

## 2022-07-27 DIAGNOSIS — M6281 Muscle weakness (generalized): Secondary | ICD-10-CM | POA: Diagnosis not present

## 2022-07-27 DIAGNOSIS — R278 Other lack of coordination: Secondary | ICD-10-CM

## 2022-07-27 DIAGNOSIS — R2681 Unsteadiness on feet: Secondary | ICD-10-CM

## 2022-07-27 NOTE — Therapy (Signed)
OUTPATIENT PHYSICAL THERAPY TREATMENT NOTE   Patient Name: Barry [name] MRN: [id number] DOB:[birth year], 88 y.o., male Today's Date: 07/27/2022  PCP:  Jenna Luo, MD REFERRING PROVIDER: Penni Bombard, MD   END OF SESSION:   PT End of Session - 07/27/22 1403     Visit Number 12    Number of Visits 16    Date for PT Re-Evaluation 07/30/22    Authorization Type Medicare/[employer]    Progress Note Due on Visit 20    PT Start Time 1400    PT Stop Time 3710    PT Time Calculation (min) 42 min    Equipment Utilized During Treatment Gait belt    Activity Tolerance Patient tolerated treatment well    Behavior During Therapy WFL for tasks assessed/performed             Past Medical History:  Diagnosis Date   AAA (abdominal aortic aneurysm) ([place]) 12/17/2007   4.7 cm   Allergy    rhinitis   Anxiety    Cancer ([place])    skin   Cataract    Depression    Diabetes mellitus    Diverticulosis    GERD (gastroesophageal reflux disease)    Hepatitis    Hx of colonic polyps 08/26/2015   Hyperlipidemia    Hypertension    Nephrolithiasis    Personal history of colonic polyps    adenomas, serrated also   PVD (peripheral vascular disease) ([place])    Stroke ([place])    Tobacco abuse    Past Surgical History:  Procedure Laterality Date   COLONOSCOPY  2006, 2008, 2010, 05/27/2011   numerous adenomas - 13 in 2006, 3, 2008, 2 2012 (up to 1 cm), 4 diminutive adenomas, serrated adenomas 2012. Diverticulosis and hemorrhoids also.   EYE SURGERY     EYE SURGERY Left    Kidney stone operation     lower limb amputation, other toe 5th     percutaneous stent graft repair of infrarenal AAA  11/10   5.6 cm (T. early)   surgical excision basal cell carcinoma     TEE WITHOUT CARDIOVERSION N/A 07/29/2021   Procedure: TRANSESOPHAGEAL ECHOCARDIOGRAM (TEE);  Surgeon: Geralynn Rile, MD;  Location: [facility];  Service: Cardiovascular;  Laterality: N/A;   tympanic eardrum repair      VASECTOMY     Patient Active Problem List   Diagnosis Date Noted   Right thalamic infarction ([place]) 07/31/2021   Stage 3a chronic kidney disease ([place])    Benign essential HTN    Left hemiparesis ([place])    Type 2 diabetes mellitus with hyperglycemia, without long-term current use of insulin ([place])    CVA (cerebral vascular accident) ([place]) 07/27/2021   Frequent PVCs 03/20/2020   Aortic stenosis 03/20/2020   COPD (chronic obstructive pulmonary disease) ([place]) 03/20/2020   Constipation 04/30/2019   Protein-calorie malnutrition ([place]) 04/30/2019   Anxiety with depression 12/05/2018   Seasonal and perennial allergic rhinitis 08/09/2018   Carotid artery disease ([place]) 11/18/2017   Hx of colonic polyps 08/26/2015   Erectile dysfunction 02/17/2015   AAA (abdominal aortic aneurysm) without rupture ([place]) 05/21/2014   Chest pain 01/31/2014   CAD (coronary artery disease) 12/24/2013   HEARING LOSS, CONDUCTIVE, LEFT 02/23/2010   DIZZINESS, CHRONIC 05/28/2008   PULMONARY NODULE 09/07/2007   Diabetes mellitus type II, controlled ([place]) 09/06/2007   Hyperlipidemia 09/06/2007   TOBACCO ABUSE 09/06/2007   Essential hypertension 09/06/2007   Allergic rhinitis 09/06/2007   GERD 09/06/2007  BASAL CELL CARCINOMA, NOSE 09/06/2007    REFERRING DIAG:  E72.094 (ICD-10-CM) - Cerebrovascular accident (CVA) due to bilateral embolism of posterior cerebral arteries (HCC)  G81.94 (ICD-10-CM) - Left hemiparesis (HCC)    THERAPY DIAG:  Abnormal posture  Other abnormalities of gait and mobility  Muscle weakness (generalized)  Other lack of coordination  Unsteadiness on feet  Rationale for Evaluation and Treatment Rehabilitation  PERTINENT HISTORY: PMH:  AAA, anxiety, depression, skin cancer, DM, GERD, HLD, HTN, CVA, CAD, COPD  PRECAUTIONS:  Fall  SUBJECTIVE: Patient reports doing fair. Is very tired today. Discussed with patient referral placed for Vertigo- patient currently having vertigo  addressed during this POC and so new referral has been closed. Denies falls/near falls. Reports still walking ~55f at a time.   PAIN:  Are you having pain? No   TODAY'S TREATMENT:   NMR: -seated VOR x1 horizontal/vertical 2x30s  -seated Hart Chart x3 lines horizontal  -standing Hart Chart x5 lines horizontal  -standing VOR x1 horizontal/vertical 2x30s with increased difficulty maintaining gaze noted, especially with horizontal -NuStep level 3 x10 mins B UE/LE    Gait:    -2MWT: 115' RW + CGA with intermittent verbal cues for increased B knee extension in stance  HOME EXERCISE PROGRAM  Access Code: [email or web address] URL: [email or web address] Date: 07/08/2022 [facility]  Exercises - Sit to Stand with Resistance Around Legs  - 1 x daily - 7 x weekly - 3 sets - 10 reps - Seated Hip Abduction with Resistance  - 1 x daily - 5 x weekly - 2 sets - 10 reps - Seated Ankle Dorsiflexion with Resistance  - 1 x daily - 5 x weekly - 2 sets - 10 reps - Side to side weightshift  - 1 x daily - 5 x weekly - 1-2 sets - 10 reps - Supine Bridge  - 1 x daily - 5 x weekly - 2 sets - 10 reps - 3 sec hold - Hooklying Clamshell with Resistance  - 1 x daily - 5 x weekly - 1-2 sets - 10 reps - Short Arc Quad with Ankle Weight  - 1 x daily - 5 x weekly - 1-2 sets - 10 reps   Gaze Stabilization: Sitting    Keeping eyes on target on wall 5 feet away, tilt head down 15-30 and move head side to side for __30__ seconds. Repeat while moving head up and down for __30__ seconds. Do __3__ sessions per day. Repeat using target on pattern background.  You Can Walk For A Certain Length Of Time Each Day                          Walk 2 minutes 2 times per day.             Increase 1  minutes every 3 days              Work up to 15 minutes (1-2 times per day).               Example:                         Day 1-2           4-5 minutes     3 times per  day  Day 7-8           10-12 minutes 2-3 times per day                         Day 13-14       20-22 minutes 1-2 times per day   PATIENT EDUCATION: Education details: HEP, vestib assessment findings Person educated: Patient Education method: Explanation, Demonstration, Tactile cues, Verbal cues, and Handouts Education comprehension: verbalized understanding, returned demonstration, verbal cues required, and needs further education     GOALS: Goals reviewed with patient? Yes   SHORT TERM GOALS: Target date: 07/02/2022   Pt will be supervision with HEP for improved strength, balance, gait. Baseline: Goal status: MET   2.  Pt will improve 5x sit<>stand to less than or equal to 20 sec to demonstrate improved functional strength and transfer efficiency.  Baseline: 27.97 sec with definite UE support; 07/05/22 26.24 sec with B armrest support Goal status: NOT MET   3.  Pt will improve Berg score to at least 22/56 to decrease fall risk. Baseline: 14/56>18/56 07/08/2022 Goal status: NOT MET     LONG TERM GOALS: Target date: 07/30/2022   Pt will be independent with HEP for improved strength, balance, transfers, and gait. Baseline:  Goal status: IN PROGRESS   2.  Pt will improve 5x sit<>stand to less than or equal to 15 sec to demonstrate improved functional strength and transfer efficiency. Baseline: 27.97 sec BUE support at eval Goal status: IN PROGRESS   3.  Pt will improve TUG score to less than or equal to 20 sec for decreased fall risk. Baseline: 39.12 sec with RW Goal status: IN PROGRESS   4.  Pt will improve gait velocity to at least 1.8 ft/sec for improved gait efficiency and safety. Baseline: 0.99 ft/sec Goal status: IN PROGRESS    5.  Pt will improve Berg score to at least 32/56 to decrease fall risk. Baseline: 14/56 Goal status: IN PROGRESS  6. Patient will ambulate >265f during 2MWT with LRAD and assist as needed to demonstrate improved  endurance.   Baseline: 115' RW + CGA  Goal status: NEW   ASSESSMENT:   CLINICAL IMPRESSION: Patient seen for skilled PT session with emphasis on L NMR and vestib retraining. Patient continuing to require consistent cues to complete VOR exercises correctly, but when compelted correctly, patient reporting less dizziness and less eye slippage from target, especially with horizontal VOR. 2 Min Walk Test:  Instructed patient to ambulate as quickly and as safely as possible for 2 minutes using LRAD. Patient was allowed to take standing rest breaks without stopping the test, but if the patient required a sitting rest break the clock would be stopped and the test would be over.  Results: 115 feet using a RW with CGA and consistent verbal cues for increased B knee extension in stance. Results indicate that the patient has reduced endurance with ambulation compared to age matched and population- specific norms.  Age Matched and population specific Norms: community dwelling elderly adults: 4957f([id number] MS: 567.5ft95fDC: 63ft67fCI: 358.5ft. 60ftinue POC.     OBJECTIVE IMPAIRMENTS Abnormal gait, decreased balance, decreased coordination, decreased mobility, difficulty walking, decreased ROM, decreased strength, decreased safety awareness, dizziness, impaired flexibility, impaired tone, and postural dysfunction.    ACTIVITY LIMITATIONS sitting, standing, stairs, transfers, and locomotion level   PARTICIPATION LIMITATIONS: driving, community activity, and living independently   PERSONGenevasince onset of injury/illness/exacerbation and 3+  comorbidities: see above  are also affecting patient's functional outcome.    REHAB POTENTIAL: Good   CLINICAL DECISION MAKING: Evolving/moderate complexity   EVALUATION COMPLEXITY: Moderate   PLAN: PT FREQUENCY: 2x/week   PT DURATION: 8 weeks, including eval week   PLANNED INTERVENTIONS: Therapeutic exercises, Therapeutic activity, Neuromuscular  re-education, Balance training, Gait training, Patient/Family education, Self Care, Stair training, Vestibular training, Canalith repositioning, DME instructions, and Manual therapy   PLAN FOR NEXT SESSION:  Continue work on gluts, hamstrings, quads; work on [place], weightbearing through LLE with standing, transfers, wider BOS with gait, progressing household gait with RW; VOR x1 , endurance   Debbora Dus, PT, DPT, CBIS 07/27/22 2:45 PM

## 2022-07-30 ENCOUNTER — Ambulatory Visit: Payer: Medicare Other

## 2022-07-30 DIAGNOSIS — M6281 Muscle weakness (generalized): Secondary | ICD-10-CM | POA: Diagnosis not present

## 2022-07-30 DIAGNOSIS — R2689 Other abnormalities of gait and mobility: Secondary | ICD-10-CM

## 2022-07-30 DIAGNOSIS — R2681 Unsteadiness on feet: Secondary | ICD-10-CM | POA: Diagnosis not present

## 2022-07-30 DIAGNOSIS — R293 Abnormal posture: Secondary | ICD-10-CM

## 2022-07-30 DIAGNOSIS — R278 Other lack of coordination: Secondary | ICD-10-CM

## 2022-07-30 NOTE — Therapy (Unsigned)
OUTPATIENT PHYSICAL THERAPY TREATMENT NOTE/RE-CERT   Patient Name: Barry [name] MRN: [id number] DOB:Mar 04, 1933, 88 y.o., male Today's Date: 07/30/2022  PCP:  Jenna Luo, MD REFERRING PROVIDER: Penni Bombard, MD   END OF SESSION:   PT End of Session - 07/30/22 1407     Visit Number 13    Number of Visits 24    Date for PT Re-Evaluation 08/27/22    Authorization Type Medicare/[employer]    Progress Note Due on Visit 20    PT Start Time 1400    PT Stop Time 7408    PT Time Calculation (min) 45 min    Equipment Utilized During Treatment Gait belt    Activity Tolerance Patient tolerated treatment well    Behavior During Therapy [facility] for tasks assessed/performed             Past Medical History:  Diagnosis Date   AAA (abdominal aortic aneurysm) ([place]) 12/17/2007   4.7 cm   Allergy    rhinitis   Anxiety    Cancer ([place])    skin   Cataract    Depression    Diabetes mellitus    Diverticulosis    GERD (gastroesophageal reflux disease)    Hepatitis    Hx of colonic polyps 08/26/2015   Hyperlipidemia    Hypertension    Nephrolithiasis    Personal history of colonic polyps    adenomas, serrated also   PVD (peripheral vascular disease) ([place])    Stroke ([place])    Tobacco abuse    Past Surgical History:  Procedure Laterality Date   COLONOSCOPY  2006, 2008, 2010, 05/27/2011   numerous adenomas - 13 in 2006, 3, 2008, 2 2012 (up to 1 cm), 4 diminutive adenomas, serrated adenomas 2012. Diverticulosis and hemorrhoids also.   EYE SURGERY     EYE SURGERY Left    Kidney stone operation     lower limb amputation, other toe 5th     percutaneous stent graft repair of infrarenal AAA  11/10   5.6 cm (T. early)   surgical excision basal cell carcinoma     TEE WITHOUT CARDIOVERSION N/A 07/29/2021   Procedure: TRANSESOPHAGEAL ECHOCARDIOGRAM (TEE);  Surgeon: Geralynn Rile, MD;  Location: [facility];  Service: Cardiovascular;  Laterality: N/A;   tympanic eardrum  repair     VASECTOMY     Patient Active Problem List   Diagnosis Date Noted   Right thalamic infarction ([place]) 07/31/2021   Stage 3a chronic kidney disease ([place])    Benign essential HTN    Left hemiparesis ([place])    Type 2 diabetes mellitus with hyperglycemia, without long-term current use of insulin ([place])    CVA (cerebral vascular accident) ([place]) 07/27/2021   Frequent PVCs 03/20/2020   Aortic stenosis 03/20/2020   COPD (chronic obstructive pulmonary disease) ([place]) 03/20/2020   Constipation 04/30/2019   Protein-calorie malnutrition ([place]) 04/30/2019   Anxiety with depression 12/05/2018   Seasonal and perennial allergic rhinitis 08/09/2018   Carotid artery disease ([place]) 11/18/2017   Hx of colonic polyps 08/26/2015   Erectile dysfunction 02/17/2015   AAA (abdominal aortic aneurysm) without rupture ([place]) 05/21/2014   Chest pain 01/31/2014   CAD (coronary artery disease) 12/24/2013   HEARING LOSS, CONDUCTIVE, LEFT 02/23/2010   DIZZINESS, CHRONIC 05/28/2008   PULMONARY NODULE 09/07/2007   Diabetes mellitus type II, controlled ([place]) 09/06/2007   Hyperlipidemia 09/06/2007   TOBACCO ABUSE 09/06/2007   Essential hypertension 09/06/2007   Allergic rhinitis 09/06/2007   GERD 09/06/2007  BASAL CELL CARCINOMA, NOSE 09/06/2007    REFERRING DIAG:  N18.335 (ICD-10-CM) - Cerebrovascular accident (CVA) due to bilateral embolism of posterior cerebral arteries (HCC)  G81.94 (ICD-10-CM) - Left hemiparesis (HCC)    THERAPY DIAG:  Abnormal posture  Other abnormalities of gait and mobility  Muscle weakness (generalized)  Other lack of coordination  Unsteadiness on feet  Rationale for Evaluation and Treatment Rehabilitation  PERTINENT HISTORY: PMH:  AAA, anxiety, depression, skin cancer, DM, GERD, HLD, HTN, CVA, CAD, COPD  PRECAUTIONS:  Fall  SUBJECTIVE: Patient doing well- still tired. Reports walking about the same amount at home.   PAIN:  Are you having pain? No   TODAY'S  TREATMENT:   NMR: -seated VOR x1 horizontal/vertical 2x30s   Gait:  [facility] PT Assessment - 07/30/22 0001       Standardized Balance Assessment   Standardized Balance Assessment Timed Up and Go Test    Five times sit to stand comments  20   B UE + CGA and RW   10 Meter Walk .85ms      Timed Up and Go Test   Normal TUG (seconds) 34.5            2MWT: 153' -quad sets   HOME EXERCISE PROGRAM  Access Code: [email or web address] URL: [email or web address] Date: 07/08/2022 [facility]  Exercises - Sit to Stand with Resistance Around Legs  - 1 x daily - 7 x weekly - 3 sets - 10 reps - Seated Hip Abduction with Resistance  - 1 x daily - 5 x weekly - 2 sets - 10 reps - Seated Ankle Dorsiflexion with Resistance  - 1 x daily - 5 x weekly - 2 sets - 10 reps - Side to side weightshift  - 1 x daily - 5 x weekly - 1-2 sets - 10 reps - Supine Bridge  - 1 x daily - 5 x weekly - 2 sets - 10 reps - 3 sec hold - Hooklying Clamshell with Resistance  - 1 x daily - 5 x weekly - 1-2 sets - 10 reps - Short Arc Quad with Ankle Weight  - 1 x daily - 5 x weekly - 1-2 sets - 10 reps   Gaze Stabilization: Sitting    Keeping eyes on target on wall 5 feet away, tilt head down 15-30 and move head side to side for __30__ seconds. Repeat while moving head up and down for __30__ seconds. Do __3__ sessions per day. Repeat using target on pattern background.  You Can Walk For A Certain Length Of Time Each Day                          Walk 2 minutes 2 times per day.             Increase 1  minutes every 3 days              Work up to 15 minutes (1-2 times per day).               Example:                         Day 1-2           4-5 minutes     3 times per day  Day 7-8           10-12 minutes 2-3 times per day                         Day 13-14       20-22 minutes 1-2 times per day   PATIENT EDUCATION: Education details:  HEP, Exam findings, PT POC Person educated: Patient Education method: Explanation, Demonstration, Tactile cues, Verbal cues, and Handouts Education comprehension: verbalized understanding, returned demonstration, verbal cues required, and needs further education     GOALS: Goals reviewed with patient? Yes   SHORT TERM GOALS: Target date: 07/02/2022   Pt will be supervision with HEP for improved strength, balance, gait. Baseline: Goal status: MET   2.  Pt will improve 5x sit<>stand to less than or equal to 20 sec to demonstrate improved functional strength and transfer efficiency.  Baseline: 27.97 sec with definite UE support; 07/05/22 26.24 sec with B armrest support Goal status: NOT MET   3.  Pt will improve Berg score to at least 22/56 to decrease fall risk. Baseline: 14/56>18/56 07/08/2022 Goal status: NOT MET     LONG TERM GOALS: Target date: 07/30/2022   Pt will be independent with HEP for improved strength, balance, transfers, and gait. Baseline: provided  Goal status: NOT MET, patient not able to complete all exercises independently   2.  Pt will improve 5x sit<>stand to less than or equal to 15 sec to demonstrate improved functional strength and transfer efficiency. Baseline: 27.97 sec BUE support at eval; 20s with B UE + CGA and RW Goal status: NOT MET   3.  Pt will improve TUG score to less than or equal to 20 sec for decreased fall risk. Baseline: 39.12 sec with RW; 34.5s with CGA + RW Goal status: NOT MET   4.  Pt will improve gait velocity to at least 1.8 ft/sec for improved gait efficiency and safety. Baseline: 0.99 ft/sec; 1.76f/s or .346m  Goal status: NOT MET   5.  Pt will improve Berg score to at least 32/56 to decrease fall risk. Baseline: 14/56; 19/56 Goal status: NOT MET  6. Patient will ambulate >20052furing 2MWT with LRAD and assist as needed to demonstrate improved endurance.   Baseline: 115' RW + CGA  Goal status: NEW   NEW LONG TERM GOALS:  Target date 08/27/22 Pt will be independent with guiding caregivers helping him with HEP for improved strength, balance, transfers, and gait. Baseline: to be finalized  Goal status: NEW   Pt will improve 5x sit<>stand to less than or equal to 15 sec to demonstrate improved functional strength and transfer efficiency. Baseline:  20s with B UE + CGA and RW Goal status: NEW  Pt will improve TUG score to less than or equal to 20 sec for decreased fall risk. Baseline: 34.5s with CGA + RW Goal status: NEW  Pt will improve gait velocity to at least 1.8 ft/sec for improved gait efficiency and safety. Baseline:1.2ft41for .20m/44moal status: NEW  Pt will improve Berg score to at least 32/56 to decrease fall risk. Baseline: 19/56 Goal status: NEW  Patient will ambulate >200ft 63fng 2MWT with LRAD and assist as needed to demonstrate improved endurance.  Baseline: 153' RW + CGA   Goal status: NEW  ASSESSMENT:   CLINICAL IMPRESSION: Patient seen for skilled PT session with emphasis on goal assessment and re-cert. 2 Min Walk Test:  Instructed patient to ambulate as quickly  and as safely as possible for 2 minutes using LRAD. Patient was allowed to take standing rest breaks without stopping the test, but if the patient required a sitting rest break the clock would be stopped and the test would be over.  Results: 153 feet.  Results indicate that the patient has reduced endurance with ambulation compared to age matched and population- specific norms.  Age Matched and population specific Norms: community dwelling elderly adults: 458f (MLott 462f; MS: 567.27f27fMDC: 38f46fSCI: 358.27ft.87ftient completed the Timed Up and Go test (TUG) in 34.5 seconds.  Geriatrics: need for further assessment of fall risk: ? 12 sec; Recurrent falls: > 15 sec; Vestibular Disorders fall risk: > 15 sec; Parkinson's Disease fall risk: > 16 sec ([email or web address]). Patient demonstrated increased fall risk noted by score of  19/56 on the [facility].  <45/56 = fall risk, <42/56 = predictive of recurrent falls, <40/56 = 100% fall risk  >41 = independent, 21-40 = assistive device, 0-20 = wheelchair level  MDC 6.9 (4 pts 45-56, 5 pts 35-44, 7 pts 25-34) (ANPTA Core Set of Outcome Measures for Adults with Neurologic Conditions, 2018). Five times Sit to Stand Test (FTSS) Method: Use a straight back chair with a solid seat that is 17-18" high. Ask participant to sit on the chair with arms folded across their chest.   Instructions: "Stand up and sit down as quickly as possible 5 times, keeping your arms folded across your chest."   Measurement: Stop timing when the participant touches the chair in sitting the 5th time.  TIME: 20 sec  Cut off scores indicative of increased fall risk: >12 sec CVA, >16 sec PD, >13 sec vestibular (ANPTA Core Set of Outcome Measures for Adults with Neurologic Conditions, 2018). 10 Meter Walk Test: Patient instructed to walk 10 meters (32.8 ft) as quickly and as safely as possible at their normal speed x2 and at a fast speed x2. Time measured from 2 meter mark to 8 meter mark to accommodate ramp-up and ramp-down.  Normal speed: .45m/s71m off scores: <0.4 m/s = household Ambulator, 0.4-0.8 m/s = limited community Ambulator, >0.8 m/s = community Ambulator, >1.2 m/s = crossing a street, <1.0 = increased fall risk MCID 0.05 m/s (small), 0.13 m/s (moderate), 0.06 m/s (significant)  (ANPTA Core Set of Outcome Measures for Adults with Neurologic Conditions, 2018). Continue POC.    OBJECTIVE IMPAIRMENTS Abnormal gait, decreased balance, decreased coordination, decreased mobility, difficulty walking, decreased ROM, decreased strength, decreased safety awareness, dizziness, impaired flexibility, impaired tone, and postural dysfunction.    ACTIVITY LIMITATIONS sitting, standing, stairs, transfers, and locomotion level   PARTICIPATION LIMITATIONS: driving, community activity, and living  independently   PERSONBelmondsince onset of injury/illness/exacerbation and 3+ comorbidities: see above  are also affecting patient's functional outcome.    REHAB POTENTIAL: Good   CLINICAL DECISION MAKING: Evolving/moderate complexity   EVALUATION COMPLEXITY: Moderate   PLAN: PT FREQUENCY: 2x/week   PT DURATION: 8 weeks, including eval week   PLANNED INTERVENTIONS: Therapeutic exercises, Therapeutic activity, Neuromuscular re-education, Balance training, Gait training, Patient/Family education, Self Care, Stair training, Vestibular training, Canalith repositioning, DME instructions, and Manual therapy   PLAN FOR NEXT SESSION:  Continue work on gluts, hamstrings, quads; work on weightHarrisonhtbearing through LLE with standing, transfers, wider BOS with gait, progressing household gait with RW; VOR x1 , endurance   JennifDebbora DusDPT, CBIS 07/30/22 2:47 PM

## 2022-08-03 ENCOUNTER — Ambulatory Visit: Payer: Medicare Other | Admitting: Physical Therapy

## 2022-08-03 ENCOUNTER — Other Ambulatory Visit: Payer: Self-pay | Admitting: Family Medicine

## 2022-08-03 NOTE — Telephone Encounter (Signed)
Requested Prescriptions  Pending Prescriptions Disp Refills  . buPROPion (WELLBUTRIN XL) 150 MG 24 hr tablet [Pharmacy Med Name: BUPROPION ER XL 150 MG TAB 150 Tablet] 30 tablet 2    Sig: TAKE 1 TABLET (150 MG TOTAL) BY MOUTH DAILY.     Psychiatry: Antidepressants - bupropion Failed - 08/03/2022  5:46 PM      Failed - Cr in normal range and within 360 days    Creat  Date Value Ref Range Status  01/04/2022 1.34 (H) 0.70 - 1.22 mg/dL Final   Creatinine, Urine  Date Value Ref Range Status  03/19/2021 116 20 - 320 mg/dL Final         Failed - AST in normal range and within 360 days    AST  Date Value Ref Range Status  08/03/2021 15 15 - 41 U/L Final         Failed - ALT in normal range and within 360 days    ALT  Date Value Ref Range Status  08/03/2021 13 0 - 44 U/L Final         Passed - Completed PHQ-2 or PHQ-9 in the last 360 days      Passed - Last BP in normal range    BP Readings from Last 1 Encounters:  07/23/22 (!) 114/56         Passed - Valid encounter within last 6 months    Recent Outpatient Visits          5 months ago Type 2 diabetes mellitus with hyperglycemia, without long-term current use of insulin ([place])   [place] Pickard, Cammie Mcgee, MD   7 months ago [place] Schaumann, Cammie Mcgee, MD   9 months ago Essential hypertension   [place], Cammie Mcgee, MD   10 months ago Mass of parotid gland   [place] Pickard, Cammie Mcgee, MD   1 year ago Dizziness and giddiness   [place] Pickard, Cammie Mcgee, MD

## 2022-08-06 ENCOUNTER — Ambulatory Visit: Payer: Medicare Other

## 2022-08-06 DIAGNOSIS — R2681 Unsteadiness on feet: Secondary | ICD-10-CM

## 2022-08-06 DIAGNOSIS — R293 Abnormal posture: Secondary | ICD-10-CM

## 2022-08-06 DIAGNOSIS — R2689 Other abnormalities of gait and mobility: Secondary | ICD-10-CM | POA: Diagnosis not present

## 2022-08-06 DIAGNOSIS — M6281 Muscle weakness (generalized): Secondary | ICD-10-CM

## 2022-08-06 DIAGNOSIS — R278 Other lack of coordination: Secondary | ICD-10-CM

## 2022-08-06 NOTE — Therapy (Signed)
OUTPATIENT PHYSICAL THERAPY TREATMENT NOTE/RE-CERT   Patient Name: Barry [name] MRN: [id number] DOB:09-Jan-1933, 88 y.o., male Today's Date: 08/06/2022  PCP:  Jenna Luo, MD REFERRING PROVIDER: Penni Bombard, MD   END OF SESSION:   PT End of Session - 08/06/22 1358     Visit Number 14    Number of Visits 24    Date for PT Re-Evaluation 08/27/22    Authorization Type Medicare/[employer]    Progress Note Due on Visit 20    PT Start Time 1400    PT Stop Time 1440    PT Time Calculation (min) 40 min    Equipment Utilized During Treatment Gait belt    Activity Tolerance Patient tolerated treatment well    Behavior During Therapy [facility] for tasks assessed/performed             Past Medical History:  Diagnosis Date   AAA (abdominal aortic aneurysm) ([place]) 12/17/2007   4.7 cm   Allergy    rhinitis   Anxiety    Cancer ([place])    skin   Cataract    Depression    Diabetes mellitus    Diverticulosis    GERD (gastroesophageal reflux disease)    Hepatitis    Hx of colonic polyps 08/26/2015   Hyperlipidemia    Hypertension    Nephrolithiasis    Personal history of colonic polyps    adenomas, serrated also   PVD (peripheral vascular disease) ([place])    Stroke ([place])    Tobacco abuse    Past Surgical History:  Procedure Laterality Date   COLONOSCOPY  2006, 2008, 2010, 05/27/2011   numerous adenomas - 13 in 2006, 3, 2008, 2 2012 (up to 1 cm), 4 diminutive adenomas, serrated adenomas 2012. Diverticulosis and hemorrhoids also.   EYE SURGERY     EYE SURGERY Left    Kidney stone operation     lower limb amputation, other toe 5th     percutaneous stent graft repair of infrarenal AAA  11/10   5.6 cm (T. early)   surgical excision basal cell carcinoma     TEE WITHOUT CARDIOVERSION N/A 07/29/2021   Procedure: TRANSESOPHAGEAL ECHOCARDIOGRAM (TEE);  Surgeon: Geralynn Rile, MD;  Location: [facility];  Service: Cardiovascular;  Laterality: N/A;   tympanic eardrum  repair     VASECTOMY     Patient Active Problem List   Diagnosis Date Noted   Right thalamic infarction ([place]) 07/31/2021   Stage 3a chronic kidney disease ([place])    Benign essential HTN    Left hemiparesis ([place])    Type 2 diabetes mellitus with hyperglycemia, without long-term current use of insulin ([place])    CVA (cerebral vascular accident) ([place]) 07/27/2021   Frequent PVCs 03/20/2020   Aortic stenosis 03/20/2020   COPD (chronic obstructive pulmonary disease) ([place]) 03/20/2020   Constipation 04/30/2019   Protein-calorie malnutrition ([place]) 04/30/2019   Anxiety with depression 12/05/2018   Seasonal and perennial allergic rhinitis 08/09/2018   Carotid artery disease ([place]) 11/18/2017   Hx of colonic polyps 08/26/2015   Erectile dysfunction 02/17/2015   AAA (abdominal aortic aneurysm) without rupture ([place]) 05/21/2014   Chest pain 01/31/2014   CAD (coronary artery disease) 12/24/2013   HEARING LOSS, CONDUCTIVE, LEFT 02/23/2010   DIZZINESS, CHRONIC 05/28/2008   PULMONARY NODULE 09/07/2007   Diabetes mellitus type II, controlled ([place]) 09/06/2007   Hyperlipidemia 09/06/2007   TOBACCO ABUSE 09/06/2007   Essential hypertension 09/06/2007   Allergic rhinitis 09/06/2007   GERD 09/06/2007  BASAL CELL CARCINOMA, NOSE 09/06/2007    REFERRING DIAG:  M57.846 (ICD-10-CM) - Cerebrovascular accident (CVA) due to bilateral embolism of posterior cerebral arteries (HCC)  G81.94 (ICD-10-CM) - Left hemiparesis (HCC)    THERAPY DIAG:  Abnormal posture  Other abnormalities of gait and mobility  Muscle weakness (generalized)  Other lack of coordination  Unsteadiness on feet  Rationale for Evaluation and Treatment Rehabilitation  PERTINENT HISTORY: PMH:  AAA, anxiety, depression, skin cancer, DM, GERD, HLD, HTN, CVA, CAD, COPD  PRECAUTIONS:  Fall  SUBJECTIVE: Patient reports doing fair- still tired. States exercises are going "fair" and has not been walking any more than he previously was.  His "legs won't carry" him.   PAIN:  Are you having pain? No   TODAY'S TREATMENT:  GAIT:  -86' x2 with RW + CGA/MinA with Max verbal cues to engage L quads during stance phase for improved stability  -extensive conversation about the need to walk and do exercises at home in order to see significant improvement   -patient asking again about L knee brace that would assist with knee extension- PT reminding patient that we already discussed this and that a brace like that would not be appropriate for him (KAFO)   NMR:  -supine SAQ 2# B LE 3x15 -hooklying bridge 3x12 -sidelying hip abduction 2# ankle weight 1x10 B LE    HOME EXERCISE PROGRAM  Access Code: [email or web address] URL: [email or web address] Date: 07/08/2022 Prepared by: [facility]  Exercises - Sit to Stand with Resistance Around Legs  - 1 x daily - 7 x weekly - 3 sets - 10 reps - Seated Hip Abduction with Resistance  - 1 x daily - 5 x weekly - 2 sets - 10 reps - Seated Ankle Dorsiflexion with Resistance  - 1 x daily - 5 x weekly - 2 sets - 10 reps - Side to side weightshift  - 1 x daily - 5 x weekly - 1-2 sets - 10 reps - Supine Bridge  - 1 x daily - 5 x weekly - 2 sets - 10 reps - 3 sec hold - Hooklying Clamshell with Resistance  - 1 x daily - 5 x weekly - 1-2 sets - 10 reps - Short Arc Quad with Ankle Weight  - 1 x daily - 5 x weekly - 1-2 sets - 10 reps   Gaze Stabilization: Sitting    Keeping eyes on target on wall 5 feet away, tilt head down 15-30 and move head side to side for __30__ seconds. Repeat while moving head up and down for __30__ seconds. Do __3__ sessions per day. Repeat using target on pattern background.  You Can Walk For A Certain Length Of Time Each Day                          Walk 2 minutes 2 times per day.             Increase 1  minutes every 3 days              Work up to 15 minutes (1-2 times per day).               Example:                          Day 1-2           4-5 minutes  3 times per day                         Day 7-8           10-12 minutes 2-3 times per day                         Day 13-14       20-22 minutes 1-2 times per day   PATIENT EDUCATION: Education details: HEP, Exam findings, PT POC Person educated: Patient Education method: Explanation, Demonstration, Tactile cues, Verbal cues, and Handouts Education comprehension: verbalized understanding, returned demonstration, verbal cues required, and needs further education     GOALS: Goals reviewed with patient? Yes   SHORT TERM GOALS: Target date: 07/02/2022   Pt will be supervision with HEP for improved strength, balance, gait. Baseline: Goal status: MET   2.  Pt will improve 5x sit<>stand to less than or equal to 20 sec to demonstrate improved functional strength and transfer efficiency.  Baseline: 27.97 sec with definite UE support; 07/05/22 26.24 sec with B armrest support Goal status: NOT MET   3.  Pt will improve Berg score to at least 22/56 to decrease fall risk. Baseline: 14/56>18/56 07/08/2022 Goal status: NOT MET     LONG TERM GOALS: Target date: 07/30/2022   Pt will be independent with HEP for improved strength, balance, transfers, and gait. Baseline: provided  Goal status: NOT MET, patient not able to complete all exercises independently   2.  Pt will improve 5x sit<>stand to less than or equal to 15 sec to demonstrate improved functional strength and transfer efficiency. Baseline: 27.97 sec BUE support at eval; 20s with B UE + CGA and RW Goal status: NOT MET   3.  Pt will improve TUG score to less than or equal to 20 sec for decreased fall risk. Baseline: 39.12 sec with RW; 34.5s with CGA + RW Goal status: NOT MET   4.  Pt will improve gait velocity to at least 1.8 ft/sec for improved gait efficiency and safety. Baseline: 0.99 ft/sec; 1.16f/s or .383m  Goal status: NOT MET   5.  Pt will improve Berg score to at least 32/56 to  decrease fall risk. Baseline: 14/56; 19/56 Goal status: NOT MET  6. Patient will ambulate >20082furing 2MWT with LRAD and assist as needed to demonstrate improved endurance.   Baseline: 115' RW + CGA  Goal status: NEW   NEW LONG TERM GOALS: Target date 08/27/22 Pt will be independent with guiding caregivers helping him with HEP for improved strength, balance, transfers, and gait. Baseline: to be finalized  Goal status: NEW   Pt will improve 5x sit<>stand to less than or equal to 15 sec to demonstrate improved functional strength and transfer efficiency. Baseline:  20s with B UE + CGA and RW Goal status: NEW  Pt will improve TUG score to less than or equal to 20 sec for decreased fall risk. Baseline: 34.5s with CGA + RW Goal status: NEW  Pt will improve gait velocity to at least 1.8 ft/sec for improved gait efficiency and safety. Baseline:1.2ft67for .69m/52moal status: NEW  Pt will improve Berg score to at least 32/56 to decrease fall risk. Baseline: 19/56 Goal status: NEW  Patient will ambulate >200ft 43fng 2MWT with LRAD and assist as needed to demonstrate improved endurance.  Baseline: 153' RW + CGA   Goal status: NEW  ASSESSMENT:  CLINICAL IMPRESSION: Patient seen for skilled PT session with emphasis on functional LE strengthening. Patient continues to requires education on functional return post stroke. Continue POC.    OBJECTIVE IMPAIRMENTS Abnormal gait, decreased balance, decreased coordination, decreased mobility, difficulty walking, decreased ROM, decreased strength, decreased safety awareness, dizziness, impaired flexibility, impaired tone, and postural dysfunction.    ACTIVITY LIMITATIONS sitting, standing, stairs, transfers, and locomotion level   PARTICIPATION LIMITATIONS: driving, community activity, and living independently   [place] Time since onset of injury/illness/exacerbation and 3+ comorbidities: see above  are also affecting patient's  functional outcome.    REHAB POTENTIAL: Good   CLINICAL DECISION MAKING: Evolving/moderate complexity   EVALUATION COMPLEXITY: Moderate   PLAN: PT FREQUENCY: 2x/week   PT DURATION: 8 weeks, including eval week   PLANNED INTERVENTIONS: Therapeutic exercises, Therapeutic activity, Neuromuscular re-education, Balance training, Gait training, Patient/Family education, Self Care, Stair training, Vestibular training, Canal[place] repositioning, DME instructions, and Manual therapy   PLAN FOR NEXT SESSION:  Continue work on gluts, hamstrings, quads; work on [place], weightbearing through LLE with standing, transfers, wider BOS with gait, progressing household gait with RW; VOR x1 , endurance   Debbora Dus, PT, DPT, CBIS 08/06/22 2:46 PM

## 2022-08-12 ENCOUNTER — Ambulatory Visit: Payer: Medicare Other | Admitting: Physical Therapy

## 2022-08-13 ENCOUNTER — Ambulatory Visit: Payer: Medicare Other | Admitting: Physical Therapy

## 2022-08-20 ENCOUNTER — Ambulatory Visit: Payer: Medicare Other | Attending: Diagnostic Neuroimaging | Admitting: Physical Therapy

## 2022-08-20 DIAGNOSIS — M6281 Muscle weakness (generalized): Secondary | ICD-10-CM | POA: Diagnosis not present

## 2022-08-20 DIAGNOSIS — R2689 Other abnormalities of gait and mobility: Secondary | ICD-10-CM | POA: Insufficient documentation

## 2022-08-20 DIAGNOSIS — R293 Abnormal posture: Secondary | ICD-10-CM | POA: Diagnosis not present

## 2022-08-20 DIAGNOSIS — R2681 Unsteadiness on feet: Secondary | ICD-10-CM | POA: Diagnosis not present

## 2022-08-20 NOTE — Therapy (Signed)
OUTPATIENT PHYSICAL THERAPY TREATMENT NOTE-DISCHARGE   Patient Name: CODEE TUTSON MRN: [id number] DOB:[birth year], 88 y.o., male Today's Date: 08/20/2022  PCP:  Jenna Luo, MD REFERRING PROVIDER: Penni Bombard, MD   PHYSICAL THERAPY DISCHARGE SUMMARY  Visits from Start of Care: 15  Current functional level related to goals / functional outcomes: CGA to min A for transfers and gait with RW   Remaining deficits: Decreased safety/balance/independence with transfers and functional mobility   Education / Equipment: HEP handout provided in previous sessions   Patient agrees to discharge. Patient goals were not met. Patient is being discharged due to maximized rehab potential.    END OF SESSION:   PT End of Session - 08/20/22 1404     Visit Number 15    Number of Visits 24    Date for PT Re-Evaluation 08/27/22    Authorization Type Medicare/[employer]    Progress Note Due on Visit 20    PT Start Time 1402    PT Stop Time 1440   d/c   PT Time Calculation (min) 38 min    Equipment Utilized During Treatment Gait belt    Activity Tolerance Patient tolerated treatment well    Behavior During Therapy WFL for tasks assessed/performed              Past Medical History:  Diagnosis Date   AAA (abdominal aortic aneurysm) ([place]) 12/17/2007   4.7 cm   Allergy    rhinitis   Anxiety    Cancer ([place])    skin   Cataract    Depression    Diabetes mellitus    Diverticulosis    GERD (gastroesophageal reflux disease)    Hepatitis    Hx of colonic polyps 08/26/2015   Hyperlipidemia    Hypertension    Nephrolithiasis    Personal history of colonic polyps    adenomas, serrated also   PVD (peripheral vascular disease) ([place])    Stroke ([place])    Tobacco abuse    Past Surgical History:  Procedure Laterality Date   COLONOSCOPY  2006, 2008, 2010, 05/27/2011   numerous adenomas - 13 in 2006, 3, 2008, 2 2012 (up to 1 cm), 4 diminutive adenomas, serrated adenomas  2012. Diverticulosis and hemorrhoids also.   EYE SURGERY     EYE SURGERY Left    Kidney stone operation     lower limb amputation, other toe 5th     percutaneous stent graft repair of infrarenal AAA  11/10   5.6 cm (T. early)   surgical excision basal cell carcinoma     TEE WITHOUT CARDIOVERSION N/A 07/29/2021   Procedure: TRANSESOPHAGEAL ECHOCARDIOGRAM (TEE);  Surgeon: Geralynn Rile, MD;  Location: [facility];  Service: Cardiovascular;  Laterality: N/A;   tympanic eardrum repair     VASECTOMY     Patient Active Problem List   Diagnosis Date Noted   Right thalamic infarction ([place]) 07/31/2021   Stage 3a chronic kidney disease ([place])    Benign essential HTN    Left hemiparesis ([place])    Type 2 diabetes mellitus with hyperglycemia, without long-term current use of insulin ([place])    CVA (cerebral vascular accident) ([place]) 07/27/2021   Frequent PVCs 03/20/2020   Aortic stenosis 03/20/2020   COPD (chronic obstructive pulmonary disease) ([place]) 03/20/2020   Constipation 04/30/2019   Protein-calorie malnutrition ([place]) 04/30/2019   Anxiety with depression 12/05/2018   Seasonal and perennial allergic rhinitis 08/09/2018   Carotid artery disease ([place]) 11/18/2017   Hx of  colonic polyps 08/26/2015   Erectile dysfunction 02/17/2015   AAA (abdominal aortic aneurysm) without rupture ([place]) 05/21/2014   Chest pain 01/31/2014   CAD (coronary artery disease) 12/24/2013   HEARING LOSS, CONDUCTIVE, LEFT 02/23/2010   DIZZINESS, CHRONIC 05/28/2008   PULMONARY NODULE 09/07/2007   Diabetes mellitus type II, controlled ([place]) 09/06/2007   Hyperlipidemia 09/06/2007   TOBACCO ABUSE 09/06/2007   Essential hypertension 09/06/2007   Allergic rhinitis 09/06/2007   GERD 09/06/2007   BASAL CELL CARCINOMA, NOSE 09/06/2007    REFERRING DIAG:  G40.102 (ICD-10-CM) - Cerebrovascular accident (CVA) due to bilateral embolism of posterior cerebral arteries (HCC)  G81.94 (ICD-10-CM) - Left hemiparesis ([place])     THERAPY DIAG:  Abnormal posture  Other abnormalities of gait and mobility  Muscle weakness (generalized)  Unsteadiness on feet  Rationale for Evaluation and Treatment Rehabilitation  PERTINENT HISTORY: PMH:  AAA, anxiety, depression, skin cancer, DM, GERD, HLD, HTN, CVA, CAD, COPD  PRECAUTIONS:  Fall  SUBJECTIVE: Pt reports no changes since last session, no falls. No pain today. Pt reports he has not been doing his exercises as it has been cold.  PAIN:  Are you having pain? No   TODAY'S TREATMENT:  THER ACT:   [facility] PT Assessment - 08/20/22 1410       Ambulation/Gait   Gait velocity 32.8 ft over 24.78 sec = 1.32 ft/sec      Standardized Balance Assessment   Standardized Balance Assessment Timed Up and Go Test;Five Times Sit to Stand    Five times sit to stand comments  35   pushing up from armrests to RW; CGA     Berg Balance Test   Sit to Stand Able to stand  independently using hands    Standing Unsupported Able to stand 2 minutes with supervision    Sitting with Back Unsupported but Feet Supported on Floor or Stool Able to sit safely and securely 2 minutes    Stand to Sit Sits safely with minimal use of hands    Transfers Needs one person to assist    Standing Unsupported with Eyes Closed Able to stand 10 seconds with supervision    Standing Unsupported with Feet Together Able to place feet together independently but unable to hold for 30 seconds    From Standing, Reach Forward with Outstretched Arm Reaches forward but needs supervision    From Standing Position, Pick up Object from Floor Unable to try/needs assist to keep balance    From Standing Position, Turn to Look Behind Over each Shoulder Needs assist to keep from losing balance and falling    Turn 360 Degrees Needs assistance while turning    Standing Unsupported, Alternately Place Feet on Step/Stool Needs assistance to keep from falling or unable to try    Standing Unsupported, One Foot in [employer]  balance while stepping or standing    Standing on One Leg Unable to try or needs assist to prevent fall    Total Score 21    Berg comment: 21/56, high fall risk      Timed Up and Go Test   TUG Normal TUG    Normal TUG (seconds) 23.5   with RW, near fall when turning to sit            2MWT: 110 ft with RW, initially with Supervision with progression to min A with onset of fatigue  Gait pattern: decreased step length- Right, decreased step length- Left, knee flexed in stance- Right, knee flexed in  stance- Left, trunk flexed, and poor foot clearance- Left Distance walked: 250 ft Assistive device utilized: Walker - 2 wheeled Level of assistance: Min A Comments: decreased LE clearance with onset of fatigue; urgency with sitting down and decreased safety awareness   HOME EXERCISE PROGRAM  Access Code: [email or web address] URL: [email or web address] Date: 07/08/2022 Prepared by: [facility]  Exercises - Sit to Stand with Resistance Around Legs  - 1 x daily - 7 x weekly - 3 sets - 10 reps - Seated Hip Abduction with Resistance  - 1 x daily - 5 x weekly - 2 sets - 10 reps - Seated Ankle Dorsiflexion with Resistance  - 1 x daily - 5 x weekly - 2 sets - 10 reps - Side to side weightshift  - 1 x daily - 5 x weekly - 1-2 sets - 10 reps - Supine Bridge  - 1 x daily - 5 x weekly - 2 sets - 10 reps - 3 sec hold - Hooklying Clamshell with Resistance  - 1 x daily - 5 x weekly - 1-2 sets - 10 reps - Short Arc Quad with Ankle Weight  - 1 x daily - 5 x weekly - 1-2 sets - 10 reps   Gaze Stabilization: Sitting    Keeping eyes on target on wall 5 feet away, tilt head down 15-30 and move head side to side for __30__ seconds. Repeat while moving head up and down for __30__ seconds. Do __3__ sessions per day. Repeat using target on pattern background.  You Can Walk For A Certain Length Of Time Each Day                          Walk 2 minutes 2 times per  day.             Increase 1  minutes every 3 days              Work up to 15 minutes (1-2 times per day).               Example:                         Day 1-2           4-5 minutes     3 times per day                         Day 7-8           10-12 minutes 2-3 times per day                         Day 13-14       20-22 minutes 1-2 times per day   PATIENT EDUCATION: Education details: continue HEP and walking program, d/c from PT Person educated: Patient Education method: Explanation, Demonstration, Tactile cues, Verbal cues Education comprehension: verbalized understanding, returned demonstration     GOALS: Goals reviewed with patient? Yes   SHORT TERM GOALS: Target date: 07/02/2022   Pt will be supervision with HEP for improved strength, balance, gait. Baseline: Goal status: MET   2.  Pt will improve 5x sit<>stand to less than or equal to 20 sec to demonstrate improved functional strength and transfer efficiency.  Baseline: 27.97 sec with definite UE support; 07/05/22 26.24 sec with B armrest support  Goal status: NOT MET   3.  Pt will improve Berg score to at least 22/56 to decrease fall risk. Baseline: 14/56>18/56 07/08/2022 Goal status: NOT MET     LONG TERM GOALS: Target date: 07/30/2022   Pt will be independent with HEP for improved strength, balance, transfers, and gait. Baseline: provided  Goal status: NOT MET, patient not able to complete all exercises independently   2.  Pt will improve 5x sit<>stand to less than or equal to 15 sec to demonstrate improved functional strength and transfer efficiency. Baseline: 27.97 sec BUE support at eval; 20s with B UE + CGA and RW Goal status: NOT MET   3.  Pt will improve TUG score to less than or equal to 20 sec for decreased fall risk. Baseline: 39.12 sec with RW; 34.5s with CGA + RW Goal status: NOT MET   4.  Pt will improve gait velocity to at least 1.8 ft/sec for improved gait efficiency and safety. Baseline: 0.99  ft/sec; 1.71f/s or .384m  Goal status: NOT MET   5.  Pt will improve Berg score to at least 32/56 to decrease fall risk. Baseline: 14/56; 19/56 Goal status: NOT MET  6. Patient will ambulate >20077furing 2MWT with LRAD and assist as needed to demonstrate improved endurance.   Baseline: 115' RW + CGA  Goal status: NEW   NEW LONG TERM GOALS: Target date 08/27/22 Pt will be independent with guiding caregivers helping him with HEP for improved strength, balance, transfers, and gait. Baseline: to be finalized; pt not performing at home currently Goal status: NOT MET  Pt will improve 5x sit<>stand to less than or equal to 15 sec to demonstrate improved functional strength and transfer efficiency. Baseline:  20s with B UE + CGA and RW, 35 sec Goal status: NOT MET  Pt will improve TUG score to less than or equal to 20 sec for decreased fall risk. Baseline: 34.5s with CGA + RW, 23.5 sec with RW and CGA Goal status: NOT MET  Pt will improve gait velocity to at least 1.8 ft/sec for improved gait efficiency and safety. Baseline:1.2ft59for .9m/23m.32 ft/sec Goal status: NOT MET  Pt will improve Berg score to at least 32/56 to decrease fall risk. Baseline: 19/56, 21/56 Goal status: NOT MET  Patient will ambulate >200ft 41fng 2MWT with LRAD and assist as needed to demonstrate improved endurance.  Baseline: 153' RW + CGA, 110 ft with RW and min A   Goal status: NOT MET ASSESSMENT:   CLINICAL IMPRESSION: Emphasis of skilled PT session on reassessing LTG for d/c from PT services this date. Pt has not been seen by therapy since 08/06/22 due to frequent cancellations of scheduled appointments. Pt has met 0/6 LTG due to decreased participation in his HEP and decreased motivation level. Pt requires education during therapy session of importance of participation in his HEP in order to see improvements in his overall functional level. Pt does exhibit an improved TUG score from 34.5 sec to 23.5  sec this date, increased gait speed from 1.2 ft/sec to 1.32 ft/sec, and an improved Berg score from 19/56 to 21/56 but did not improve sufficiently in order to met set goals. Pt exhibits increased 5xSTS time from 20 sec to 35 sec this date and exhibits decreased distance covered during 2MWT from 153 ft initially to 110 ft this date. Pt encouraged to work on his walking program and HEP at home in order to improve his overall balance, strength, and endurance and  to see benefits of exercise. Pt to d/c from PT services this date as he has maximized his current rehab potential. Continue POC.   OBJECTIVE IMPAIRMENTS Abnormal gait, decreased balance, decreased coordination, decreased mobility, difficulty walking, decreased ROM, decreased strength, decreased safety awareness, dizziness, impaired flexibility, impaired tone, and postural dysfunction.    ACTIVITY LIMITATIONS sitting, standing, stairs, transfers, and locomotion level   PARTICIPATION LIMITATIONS: driving, community activity, and living independently   [place] Time since onset of injury/illness/exacerbation and 3+ comorbidities: see above  are also affecting patient's functional outcome.    REHAB POTENTIAL: Good   CLINICAL DECISION MAKING: Evolving/moderate complexity   EVALUATION COMPLEXITY: Moderate      Excell Seltzer, PT, DPT, CSRS  08/20/22 2:42 PM

## 2022-08-21 DIAGNOSIS — Z23 Encounter for immunization: Secondary | ICD-10-CM | POA: Diagnosis not present

## 2022-09-03 DIAGNOSIS — Z08 Encounter for follow-up examination after completed treatment for malignant neoplasm: Secondary | ICD-10-CM | POA: Diagnosis not present

## 2022-09-03 DIAGNOSIS — C44319 Basal cell carcinoma of skin of other parts of face: Secondary | ICD-10-CM | POA: Diagnosis not present

## 2022-09-03 DIAGNOSIS — Z85828 Personal history of other malignant neoplasm of skin: Secondary | ICD-10-CM | POA: Diagnosis not present

## 2022-09-11 ENCOUNTER — Ambulatory Visit
Admission: EM | Admit: 2022-09-11 | Discharge: 2022-09-11 | Disposition: A | Discharge status: Home / Self Care | Payer: Medicare Other | Attending: Physician Assistant | Admitting: Physician Assistant

## 2022-09-11 ENCOUNTER — Encounter: Payer: Self-pay | Admitting: Emergency Medicine

## 2022-09-11 ENCOUNTER — Ambulatory Visit
Admission: EM | Admit: 2022-09-11 | Discharge: 2022-09-11 | Discharge status: Against Medical Advice | Payer: Medicare Other

## 2022-09-11 ENCOUNTER — Other Ambulatory Visit: Payer: Self-pay

## 2022-09-11 DIAGNOSIS — U071 COVID-19: Secondary | ICD-10-CM | POA: Diagnosis not present

## 2022-09-11 NOTE — ED Provider Notes (Signed)
[name]    CSN: [id number] Arrival date & time: 09/11/22  1321      History   Chief Complaint Chief Complaint  Patient presents with   Cough    HPI LOREN SAWAYA is a 88 y.o. male.   Patient here today for evaluation of covid 19. He tested positive earlier today and is interested in antiviral therapy. He has not had any vomiting or diarrhea. He denies any fever. Congestion and cough started last night. Last labs with GFR of 52- 8 months ago.   The history is provided by the patient.  Cough Associated symptoms: no chills, no ear pain, no eye discharge, no fever, no shortness of breath and no sore throat     Past Medical History:  Diagnosis Date   AAA (abdominal aortic aneurysm) ([place]) 12/17/2007   4.7 cm   Allergy    rhinitis   Anxiety    Cancer ([place])    skin   Cataract    Depression    Diabetes mellitus    Diverticulosis    GERD (gastroesophageal reflux disease)    Hepatitis    Hx of colonic polyps 08/26/2015   Hyperlipidemia    Hypertension    Nephrolithiasis    Personal history of colonic polyps    adenomas, serrated also   PVD (peripheral vascular disease) ([place])    Stroke ([place])    Tobacco abuse     Patient Active Problem List   Diagnosis Date Noted   Right thalamic infarction ([place]) 07/31/2021   Stage 3a chronic kidney disease ([place])    Benign essential HTN    Left hemiparesis ([place])    Type 2 diabetes mellitus with hyperglycemia, without long-term current use of insulin (HCC)    CVA (cerebral vascular accident) ([place]) 07/27/2021   Frequent PVCs 03/20/2020   Aortic stenosis 03/20/2020   COPD (chronic obstructive pulmonary disease) ([place]) 03/20/2020   Constipation 04/30/2019   Protein-calorie malnutrition ([place]) 04/30/2019   Anxiety with depression 12/05/2018   Seasonal and perennial allergic rhinitis 08/09/2018   Carotid artery disease ([place]) 11/18/2017   Hx of colonic polyps 08/26/2015   Erectile dysfunction 02/17/2015   AAA  (abdominal aortic aneurysm) without rupture ([place]) 05/21/2014   Chest pain 01/31/2014   CAD (coronary artery disease) 12/24/2013   HEARING LOSS, CONDUCTIVE, LEFT 02/23/2010   DIZZINESS, CHRONIC 05/28/2008   PULMONARY NODULE 09/07/2007   Diabetes mellitus type II, controlled ([place]) 09/06/2007   Hyperlipidemia 09/06/2007   TOBACCO ABUSE 09/06/2007   Essential hypertension 09/06/2007   Allergic rhinitis 09/06/2007   GERD 09/06/2007   BASAL CELL CARCINOMA, NOSE 09/06/2007    Past Surgical History:  Procedure Laterality Date   COLONOSCOPY  2006, 2008, 2010, 05/27/2011   numerous adenomas - 13 in 2006, 3, 2008, 2 2012 (up to 1 cm), 4 diminutive adenomas, serrated adenomas 2012. Diverticulosis and hemorrhoids also.   EYE SURGERY     EYE SURGERY Left    Kidney stone operation     lower limb amputation, other toe 5th     percutaneous stent graft repair of infrarenal AAA  11/10   5.6 cm (T. early)   surgical excision basal cell carcinoma     TEE WITHOUT CARDIOVERSION N/A 07/29/2021   Procedure: TRANSESOPHAGEAL ECHOCARDIOGRAM (TEE);  Surgeon: Geralynn Rile, MD;  Location: [facility];  Service: Cardiovascular;  Laterality: N/A;   tympanic eardrum repair     VASECTOMY         Home Medications  Prior to Admission medications   Medication Sig Start Date End Date Taking? Authorizing Provider  acetaminophen (TYLENOL) 325 MG tablet Take 2 tablets (650 mg total) by mouth every 6 (six) hours as needed for mild pain (or Fever >/= 101). 08/24/21   Angiulli, Lavon Paganini, PA-C  amLODipine (NORVASC) 10 MG tablet Take 1 tablet (10 mg total) by mouth daily. 10/26/21   Susy Frizzle, MD  artificial tears (LACRILUBE) OINT ophthalmic ointment Place into both eyes every 4 (four) hours as needed for dry eyes. 08/24/21   Angiulli, Lavon Paganini, PA-C  B Complex-C (B-COMPLEX WITH VITAMIN C) tablet Take 1 tablet by mouth daily. 08/25/21   Angiulli, Lavon Paganini, PA-C  buPROPion (WELLBUTRIN XL) 150 MG 24 hr tablet TAKE  1 TABLET (150 MG TOTAL) BY MOUTH DAILY. 08/03/22   Susy Frizzle, MD  clonazePAM (KLONOPIN) 0.5 MG tablet TAKE 1/2 TABLET BY MOUTH 3 TIMES DAILY AS NEEDED FOR ANXIETY. 07/09/22   Cloria Spring, MD  clopidogrel (PLAVIX) 75 MG tablet Take 1 tablet (75 mg total) by mouth daily. 10/06/21   Susy Frizzle, MD  diclofenac Sodium (VOLTAREN) 1 % GEL Apply 2 g topically 4 (four) times daily. 08/24/21   Angiulli, Lavon Paganini, PA-C  ezetimibe (ZETIA) 10 MG tablet TAKE 1 TABLET BY MOUTH DAILY 10/06/21   Susy Frizzle, MD  FLUoxetine (PROZAC) 10 MG capsule Take 1 capsule (10 mg total) by mouth daily. 12/15/21 12/15/22  Cloria Spring, MD  FLUoxetine (PROZAC) 20 MG capsule TAKE (1) CAPSULE BY MOUTH EACH MORNING. 12/15/21   Cloria Spring, MD  fluticasone [facility]) 50 MCG/ACT nasal spray Place 2 sprays into both nostrils daily. 10/02/21   Susy Frizzle, MD  gabapentin (NEURONTIN) 300 MG capsule Take 1 capsule (300 mg total) by mouth 2 (two) times daily as needed (nerve pain). 05/17/22   Susy Frizzle, MD  glipiZIDE (GLIPIZIDE XL) 5 MG 24 hr tablet Take 1 tablet (5 mg total) by mouth daily with breakfast. 01/04/22   Susy Frizzle, MD  hydrALAZINE (APRESOLINE) 100 MG tablet TAKE 1 TABLET BY MOUTH EVERY 8 HOURS. 07/09/22   Susy Frizzle, MD  JANUVIA 100 MG tablet TAKE 1 TABLET (100 MG TOTAL) BY MOUTH DAILY. 06/22/22   Susy Frizzle, MD  magnesium gluconate (MAGONATE) 500 MG tablet Take 0.5 tablets (250 mg total) by mouth at bedtime. 08/24/21   Angiulli, Lavon Paganini, PA-C  metoprolol succinate (TOPROL-XL) 25 MG 24 hr tablet Take 0.5 tablets (12.5 mg total) by mouth daily. 10/06/21   Susy Frizzle, MD  montelukast (SINGULAIR) 10 MG tablet TAKE 1 TABLET (10 MG TOTAL) BY MOUTH AT BEDTIME. 06/22/22   Susy Frizzle, MD  rOPINIRole (REQUIP) 0.25 MG tablet Take 1 tablet (0.25 mg total) by mouth at bedtime. 10/13/21   Susy Frizzle, MD  rosuvastatin (CRESTOR) 20 MG tablet Take 1 tablet (20 mg total)  by mouth daily. 10/02/21   Susy Frizzle, MD  sulfamethoxazole-trimethoprim (BACTRIM DS) 800-160 MG tablet Take 1 tablet by mouth 2 (two) times daily. 01/04/22   Susy Frizzle, MD  valsartan (DIOVAN) 160 MG tablet Take 1 tablet (160 mg total) by mouth 2 (two) times daily. 12/25/21   Susy Frizzle, MD  vitamin B-12 (CYANOCOBALAMIN) 1000 MCG tablet Take 1,000 mcg by mouth daily.    [provider]    Family History Family History  Problem Relation Age of Onset   Emphysema Father    Cancer  Brother        Lung   Depression Brother    Depression Sister    Lung disease Sister        Fibrosis and died from pneumonia on 2013/04/20   Kidney disease Sister    Diabetes Sister    Learning disabilities Sister    Mental retardation Sister    Heart disease Mother    Cancer Brother    Colon cancer Neg Hx    Dementia Neg Hx    Alcohol abuse Neg Hx    Drug abuse Neg Hx    Paranoid behavior Neg Hx    Schizophrenia Neg Hx    Anxiety disorder Neg Hx    Bipolar disorder Neg Hx    OCD Neg Hx    Sexual abuse Neg Hx    Physical abuse Neg Hx    Seizures Neg Hx    Esophageal cancer Neg Hx    Stomach cancer Neg Hx    Rectal cancer Neg Hx     Social History Social History   Tobacco Use   Smoking status: Former    Packs/day: 1.00    Years: 60.00    Total pack years: 60.00    Types: Cigarettes   Smokeless tobacco: Never   Tobacco comments:    Hasn't smoked since he had his stroke   Vaping Use   Vaping Use: Never used  Substance Use Topics   Alcohol use: No   Drug use: No     Allergies   Lisinopril-hydrochlorothiazide and Doxycycline   Review of Systems Review of Systems  Constitutional:  Negative for chills and fever.  HENT:  Positive for congestion. Negative for ear pain and sore throat.   Eyes:  Negative for discharge and redness.  Respiratory:  Positive for cough. Negative for shortness of breath.   Gastrointestinal:  Negative for abdominal pain, nausea and  vomiting.     Physical Exam Triage Vital Signs ED Triage Vitals [09/11/22 1334]  Enc Vitals Group     BP (!) 174/71     Pulse Rate 80     Resp 18     Temp 98 F (36.7 C)     Temp Source Oral     SpO2 95 %     Weight      Height      Head Circumference      Peak Flow      Pain Score 0     Pain Loc      Pain Edu?      Excl. in [place]?    No data found.  Updated Vital Signs BP (!) 174/71 (BP Location: Right Arm)   Pulse 80   Temp 98 F (36.7 C) (Oral)   Resp 18   SpO2 95%     Physical Exam Vitals and nursing note reviewed.  Constitutional:      General: He is not in acute distress.    Appearance: Normal appearance. He is not ill-appearing.  HENT:     Head: Normocephalic and atraumatic.     Nose: Congestion present.  Eyes:     Conjunctiva/sclera: Conjunctivae normal.  Cardiovascular:     Rate and Rhythm: Normal rate and regular rhythm.     Heart sounds: Murmur heard.  Pulmonary:     Effort: Pulmonary effort is normal. No respiratory distress.     Breath sounds: Normal breath sounds. No wheezing, rhonchi or rales.  Skin:    General: Skin is warm and dry.  Neurological:     Mental Status: He is alert.  Psychiatric:        Mood and Affect: Mood normal.        Thought Content: Thought content normal.      UC Treatments / Results  Labs (all labs ordered are listed, but only abnormal results are displayed) Labs Reviewed  COMPREHENSIVE METABOLIC PANEL    EKG   Radiology No results found.  Procedures Procedures (including critical care time)  Medications Ordered in UC Medications - No data to display  Initial Impression / Assessment and Plan / UC Course  I have reviewed the triage vital signs and the nursing notes.  Pertinent labs & imaging results that were available during my care of the patient were reviewed by me and considered in my medical decision making (see chart for details).    CMP ordered for further evaluation- determine appropriate  course of treatment. Encouraged follow up with any worsening or persistent symptoms. Discussed symptomatic treatment otherwise.   Final Clinical Impressions(s) / UC Diagnoses   Final diagnoses:  [id number]   Discharge Instructions   None    ED Prescriptions   None    PDMP not reviewed this encounter.   Francene Finders, PA-C 09/11/22 1432

## 2022-09-11 NOTE — ED Triage Notes (Signed)
Pt here for cough starting this am; pt with positive home covid test

## 2022-09-12 ENCOUNTER — Telehealth: Payer: Self-pay | Admitting: Physician Assistant

## 2022-09-12 ENCOUNTER — Ambulatory Visit (HOSPITAL_COMMUNITY): Payer: Medicare Other

## 2022-09-12 LAB — COMPREHENSIVE METABOLIC PANEL
ALT: 27 IU/L (ref 0–44)
AST: 21 IU/L (ref 0–40)
Albumin/Globulin Ratio: 1.6 (ref 1.2–2.2)
Albumin: 3.7 g/dL (ref 3.7–4.7)
Alkaline Phosphatase: 48 IU/L (ref 44–121)
BUN/Creatinine Ratio: 22 (ref 10–24)
BUN: 29 mg/dL — ABNORMAL HIGH (ref 8–27)
Bilirubin Total: 0.3 mg/dL (ref 0.0–1.2)
CO2: 24 mmol/L (ref 20–29)
Calcium: 9.2 mg/dL (ref 8.6–10.2)
Chloride: 102 mmol/L (ref 96–106)
Creatinine, Ser: 1.32 mg/dL — ABNORMAL HIGH (ref 0.76–1.27)
Globulin, Total: 2.3 g/dL (ref 1.5–4.5)
Glucose: 267 mg/dL — ABNORMAL HIGH (ref 70–99)
Potassium: 4.1 mmol/L (ref 3.5–5.2)
Sodium: 141 mmol/L (ref 134–144)
Total Protein: 6 g/dL (ref 6.0–8.5)
eGFR: 52 mL/min/{1.73_m2} — ABNORMAL LOW (ref >59)

## 2022-09-12 MED ORDER — MOLNUPIRAVIR EUA 200MG CAPSULE: 4.0000 | ORAL_CAPSULE | Freq: Two times a day (BID) | ORAL | 0 refills | Status: AC

## 2022-09-12 NOTE — Telephone Encounter (Signed)
Kidney function stable, patient is on plavix- will treat with molnupiravir. Encourage follow up with any further concerns.

## 2022-09-17 ENCOUNTER — Ambulatory Visit
Admission: RE | Admit: 2022-09-17 | Discharge: 2022-09-17 | Disposition: A | Discharge status: Home / Self Care | Payer: Medicare Other | Source: Ambulatory Visit | Attending: Internal Medicine | Admitting: Internal Medicine

## 2022-09-17 ENCOUNTER — Ambulatory Visit (INDEPENDENT_AMBULATORY_CARE_PROVIDER_SITE_OTHER): Payer: Medicare Other

## 2022-09-17 VITALS — BP 143/74 | HR 69 | Temp 97.9°F | Resp 19

## 2022-09-17 DIAGNOSIS — U071 COVID-19: Secondary | ICD-10-CM

## 2022-09-17 DIAGNOSIS — R059 Cough, unspecified: Secondary | ICD-10-CM

## 2022-09-17 DIAGNOSIS — R051 Acute cough: Secondary | ICD-10-CM | POA: Diagnosis not present

## 2022-09-17 MED ORDER — BENZONATATE 100 MG PO CAPS: 100.0000 mg | ORAL_CAPSULE | Freq: Three times a day (TID) | ORAL | 0 refills | Status: DC | PRN

## 2022-09-17 NOTE — ED Triage Notes (Signed)
Pt presents to uc with son. Pt recently diagnosed with covid about 1 week ago and has cont. to have cough and congestion

## 2022-09-17 NOTE — ED Provider Notes (Signed)
[name]    CSN: [id number] Arrival date & time: 09/17/22  1551      History   Chief Complaint Chief Complaint  Patient presents with   Follow-up    Still having some issues for Covid - Entered by patient   Cough    HPI Barry [name] is a 88 y.o. male.   Patient presents with son who helps provide 69 of patient's history.  They report that he has had approximately 6 to 7-day history of nasal congestion and cough.  He was recently testing positive for COVID-19.  He was seen in urgent care on 09/11/2022 and prescribed molnupiravir antiviral.  His son reports that he took 3.5 days of molnupiravir as it was not easily tolerable for him.  He has taken over-the-counter cough medication as well with minimal improvement.  Son and patient deny formal diagnosis of asthma or COPD.  They also report that he had low-grade temp of 99.9 yesterday.  He reports that he had some intermittent shortness of breath but denies chest pain.   Cough   Past Medical History:  Diagnosis Date   AAA (abdominal aortic aneurysm) ([place]) 12/17/2007   4.7 cm   Allergy    rhinitis   Anxiety    Cancer ([place])    skin   Cataract    Depression    Diabetes mellitus    Diverticulosis    GERD (gastroesophageal reflux disease)    Hepatitis    Hx of colonic polyps 08/26/2015   Hyperlipidemia    Hypertension    Nephrolithiasis    Personal history of colonic polyps    adenomas, serrated also   PVD (peripheral vascular disease) ([place])    Stroke ([place])    Tobacco abuse     Patient Active Problem List   Diagnosis Date Noted   Right thalamic infarction ([place]) 07/31/2021   Stage 3a chronic kidney disease ([place])    Benign essential HTN    Left hemiparesis ([place])    Type 2 diabetes mellitus with hyperglycemia, without long-term current use of insulin (HCC)    CVA (cerebral vascular accident) ([place]) 07/27/2021   Frequent PVCs 03/20/2020   Aortic stenosis 03/20/2020   COPD (chronic  obstructive pulmonary disease) ([place]) 03/20/2020   Constipation 04/30/2019   Protein-calorie malnutrition ([place]) 04/30/2019   Anxiety with depression 12/05/2018   Seasonal and perennial allergic rhinitis 08/09/2018   Carotid artery disease ([place]) 11/18/2017   Hx of colonic polyps 08/26/2015   Erectile dysfunction 02/17/2015   AAA (abdominal aortic aneurysm) without rupture ([place]) 05/21/2014   Chest pain 01/31/2014   CAD (coronary artery disease) 12/24/2013   HEARING LOSS, CONDUCTIVE, LEFT 02/23/2010   DIZZINESS, CHRONIC 05/28/2008   PULMONARY NODULE 09/07/2007   Diabetes mellitus type II, controlled ([place]) 09/06/2007   Hyperlipidemia 09/06/2007   TOBACCO ABUSE 09/06/2007   Essential hypertension 09/06/2007   Allergic rhinitis 09/06/2007   GERD 09/06/2007   BASAL CELL CARCINOMA, NOSE 09/06/2007    Past Surgical History:  Procedure Laterality Date   COLONOSCOPY  2006, 2008, 2010, 05/27/2011   numerous adenomas - 13 in 2006, 3, 2008, 2 2012 (up to 1 cm), 4 diminutive adenomas, serrated adenomas 2012. Diverticulosis and hemorrhoids also.   EYE SURGERY     EYE SURGERY Left    Kidney stone operation     lower limb amputation, other toe 5th     percutaneous stent graft repair of infrarenal AAA  11/10   5.6 cm (T. early)   surgical  excision basal cell carcinoma     TEE WITHOUT CARDIOVERSION N/A 07/29/2021   Procedure: TRANSESOPHAGEAL ECHOCARDIOGRAM (TEE);  Surgeon: Geralynn Rile, MD;  Location: [facility];  Service: Cardiovascular;  Laterality: N/A;   tympanic eardrum repair     VASECTOMY         Home Medications    Prior to Admission medications   Medication Sig Start Date End Date Taking? Authorizing Provider  benzonatate (TESSALON) 100 MG capsule Take 1 capsule (100 mg total) by mouth every 8 (eight) hours as needed for cough. 09/17/22  Yes Makhya Arave, Michele Rockers, FNP  acetaminophen (TYLENOL) 325 MG tablet Take 2 tablets (650 mg total) by mouth every 6 (six) hours as needed for mild  pain (or Fever >/= 101). 08/24/21   Angiulli, Lavon Paganini, PA-C  amLODipine (NORVASC) 10 MG tablet Take 1 tablet (10 mg total) by mouth daily. 10/26/21   Susy Frizzle, MD  artificial tears (LACRILUBE) OINT ophthalmic ointment Place into both eyes every 4 (four) hours as needed for dry eyes. 08/24/21   Angiulli, Lavon Paganini, PA-C  B Complex-C (B-COMPLEX WITH VITAMIN C) tablet Take 1 tablet by mouth daily. 08/25/21   Angiulli, Lavon Paganini, PA-C  buPROPion (WELLBUTRIN XL) 150 MG 24 hr tablet TAKE 1 TABLET (150 MG TOTAL) BY MOUTH DAILY. 08/03/22   Susy Frizzle, MD  clonazePAM (KLONOPIN) 0.5 MG tablet TAKE 1/2 TABLET BY MOUTH 3 TIMES DAILY AS NEEDED FOR ANXIETY. 07/09/22   Cloria Spring, MD  clopidogrel (PLAVIX) 75 MG tablet Take 1 tablet (75 mg total) by mouth daily. 10/06/21   Susy Frizzle, MD  diclofenac Sodium (VOLTAREN) 1 % GEL Apply 2 g topically 4 (four) times daily. 08/24/21   Angiulli, Lavon Paganini, PA-C  ezetimibe (ZETIA) 10 MG tablet TAKE 1 TABLET BY MOUTH DAILY 10/06/21   Susy Frizzle, MD  FLUoxetine (PROZAC) 10 MG capsule Take 1 capsule (10 mg total) by mouth daily. 12/15/21 12/15/22  Cloria Spring, MD  FLUoxetine (PROZAC) 20 MG capsule TAKE (1) CAPSULE BY MOUTH EACH MORNING. 12/15/21   Cloria Spring, MD  fluticasone [facility]) 50 MCG/ACT nasal spray Place 2 sprays into both nostrils daily. 10/02/21   Susy Frizzle, MD  gabapentin (NEURONTIN) 300 MG capsule Take 1 capsule (300 mg total) by mouth 2 (two) times daily as needed (nerve pain). 05/17/22   Susy Frizzle, MD  glipiZIDE (GLIPIZIDE XL) 5 MG 24 hr tablet Take 1 tablet (5 mg total) by mouth daily with breakfast. 01/04/22   Susy Frizzle, MD  hydrALAZINE (APRESOLINE) 100 MG tablet TAKE 1 TABLET BY MOUTH EVERY 8 HOURS. 07/09/22   Susy Frizzle, MD  JANUVIA 100 MG tablet TAKE 1 TABLET (100 MG TOTAL) BY MOUTH DAILY. 06/22/22   Susy Frizzle, MD  magnesium gluconate (MAGONATE) 500 MG tablet Take 0.5 tablets (250 mg total) by  mouth at bedtime. 08/24/21   Angiulli, Lavon Paganini, PA-C  metoprolol succinate (TOPROL-XL) 25 MG 24 hr tablet Take 0.5 tablets (12.5 mg total) by mouth daily. 10/06/21   Susy Frizzle, MD  molnupiravir EUA (LAGEVRIO) 200 mg CAPS capsule Take 4 capsules (800 mg total) by mouth 2 (two) times daily for 5 days. 09/12/22 09/17/22  Francene Finders, PA-C  montelukast (SINGULAIR) 10 MG tablet TAKE 1 TABLET (10 MG TOTAL) BY MOUTH AT BEDTIME. 06/22/22   Susy Frizzle, MD  rOPINIRole (REQUIP) 0.25 MG tablet Take 1 tablet (0.25 mg total) by mouth at bedtime.  10/13/21   Susy Frizzle, MD  rosuvastatin (CRESTOR) 20 MG tablet Take 1 tablet (20 mg total) by mouth daily. 10/02/21   Susy Frizzle, MD  sulfamethoxazole-trimethoprim (BACTRIM DS) 800-160 MG tablet Take 1 tablet by mouth 2 (two) times daily. 01/04/22   Susy Frizzle, MD  valsartan (DIOVAN) 160 MG tablet Take 1 tablet (160 mg total) by mouth 2 (two) times daily. 12/25/21   Susy Frizzle, MD  vitamin B-12 (CYANOCOBALAMIN) 1000 MCG tablet Take 1,000 mcg by mouth daily.    [provider]    Family History Family History  Problem Relation Age of Onset   Emphysema Father    Cancer Brother        Lung   Depression Brother    Depression Sister    Lung disease Sister        Fibrosis and died from pneumonia on March 25, 2013   Kidney disease Sister    Diabetes Sister    Learning disabilities Sister    Mental retardation Sister    Heart disease Mother    Cancer Brother    Colon cancer Neg Hx    Dementia Neg Hx    Alcohol abuse Neg Hx    Drug abuse Neg Hx    Paranoid behavior Neg Hx    Schizophrenia Neg Hx    Anxiety disorder Neg Hx    Bipolar disorder Neg Hx    OCD Neg Hx    Sexual abuse Neg Hx    Physical abuse Neg Hx    Seizures Neg Hx    Esophageal cancer Neg Hx    Stomach cancer Neg Hx    Rectal cancer Neg Hx     Social History Social History   Tobacco Use   Smoking status: Former    Packs/day: 1.00    Years:  60.00    Total pack years: 60.00    Types: Cigarettes   Smokeless tobacco: Never   Tobacco comments:    Hasn't smoked since he had his stroke   Vaping Use   Vaping Use: Never used  Substance Use Topics   Alcohol use: No   Drug use: No     Allergies   Lisinopril-hydrochlorothiazide and Doxycycline   Review of Systems Review of Systems Per HPI  Physical Exam Triage Vital Signs ED Triage Vitals  Enc Vitals Group     BP 09/17/22 1630 (!) 143/74     Pulse Rate 09/17/22 1630 (!) 116     Resp 09/17/22 1630 19     Temp 09/17/22 1630 97.9 F (36.6 C)     Temp src --      SpO2 09/17/22 1630 98 %     Weight --      Height --      Head Circumference --      Peak Flow --      Pain Score 09/17/22 1633 0     Pain Loc --      Pain Edu? --      Excl. in [place]? --    No data found.  Updated Vital Signs BP (!) 143/74   Pulse 69   Temp 97.9 F (36.6 C)   Resp 19   SpO2 98%   Visual Acuity Right Eye Distance:   Left Eye Distance:   Bilateral Distance:    Right Eye Near:   Left Eye Near:    Bilateral Near:     Physical Exam Constitutional:      General:  He is not in acute distress.    Appearance: Normal appearance. He is not toxic-appearing or diaphoretic.  HENT:     Head: Normocephalic and atraumatic.     Right Ear: Tympanic membrane and ear canal normal.     Left Ear: Tympanic membrane and ear canal normal.     Nose: Congestion present.     Mouth/Throat:     Mouth: Mucous membranes are moist.     Pharynx: No posterior oropharyngeal erythema.  Eyes:     Extraocular Movements: Extraocular movements intact.     Conjunctiva/sclera: Conjunctivae normal.     Pupils: Pupils are equal, round, and reactive to light.  Cardiovascular:     Rate and Rhythm: Normal rate and regular rhythm.     Pulses: Normal pulses.     Heart sounds: Normal heart sounds.  Pulmonary:     Effort: Pulmonary effort is normal. No respiratory distress.     Breath sounds: No stridor. No  wheezing, rhonchi or rales.     Comments: Diminished lung sounds Abdominal:     General: Abdomen is flat. Bowel sounds are normal.     Palpations: Abdomen is soft.  Musculoskeletal:        General: Normal range of motion.     Cervical back: Normal range of motion.  Skin:    General: Skin is warm and dry.  Neurological:     General: No focal deficit present.     Mental Status: He is alert and oriented to person, place, and time. Mental status is at baseline.  Psychiatric:        Mood and Affect: Mood normal.        Behavior: Behavior normal.      UC Treatments / Results  Labs (all labs ordered are listed, but only abnormal results are displayed) Labs Reviewed - No data to display  EKG   Radiology DG Chest 2 View  Result Date: 09/17/2022 CLINICAL DATA:  Cough, diagnosed with COVID 1 week ago EXAM: CHEST - 2 VIEW COMPARISON:  10/05/2021 FINDINGS: Frontal and lateral views of the chest are obtained. Evaluation is suboptimal due to over penetration on the frontal view and patient positioning on the lateral view. Cardiac silhouette is unremarkable. No focal consolidation, effusion, or pneumothorax. No acute bony abnormalities. IMPRESSION: 1. Suboptimal evaluation due to technique and patient positioning. 2. No acute airspace disease. Electronically Signed   By: Randa Ngo M.D.   On: 09/17/2022 17:18    Procedures Procedures (including critical care time)  Medications Ordered in UC Medications - No data to display  Initial Impression / Assessment and Plan / UC Course  I have reviewed the triage vital signs and the nursing notes.  Pertinent labs & imaging results that were available during my care of the patient were reviewed by me and considered in my medical decision making (see chart for details).     Patient has persistent viral COVID-19 symptoms.  Chest x-ray completed that was negative for any acute cardiopulmonary process.  Given patient's difficulty standing, x-ray  positioning was compromised.  Although, no obvious acute cardiopulmonary process noted.  Patient's physical exam stable and vital signs are stable.  Initial triage reported a heart rate of 116 but I do not think this is accurate as physical exam and recheck of heart rate was normal.  Therefore, do not think that any further workup is necessary for this.  Patient and son deny history of COPD but this is noted in patient's chart.  Although, do not suspect any COPD exacerbation at this time.  Will treat symptomatically with benzonatate.  Also advised Flonase but son reports that he already has this at home.  Discussed strict return and ER precautions.  Patient and son verbalized understanding and were agreeable with plan. Final Clinical Impressions(s) / UC Diagnoses   Final diagnoses:  COVID-19  Acute cough     Discharge Instructions      X-ray was normal.  A cough medication has been prescribed.  Please follow-up if symptoms persist or worsen.     ED Prescriptions     Medication Sig Dispense Auth. Provider   benzonatate (TESSALON) 100 MG capsule Take 1 capsule (100 mg total) by mouth every 8 (eight) hours as needed for cough. 21 capsule [place], Michele Rockers, [place]      PDMP not reviewed this encounter.   Teodora Medici, [place] 09/17/22 1734

## 2022-09-17 NOTE — Discharge Instructions (Signed)
X-ray was normal.  A cough medication has been prescribed.  Please follow-up if symptoms persist or worsen.

## 2022-09-18 ENCOUNTER — Other Ambulatory Visit: Payer: Self-pay | Admitting: Family Medicine

## 2022-09-21 ENCOUNTER — Other Ambulatory Visit: Payer: Self-pay | Admitting: Family Medicine

## 2022-09-21 ENCOUNTER — Telehealth: Payer: Self-pay

## 2022-09-21 MED ORDER — HYDROCODONE BIT-HOMATROP MBR 5-1.5 MG/5ML PO SOLN: 5.0000 mL | Freq: Three times a day (TID) | ORAL | 0 refills | Status: DC | PRN

## 2022-09-21 NOTE — Telephone Encounter (Signed)
Pt's son, [name] called stating his father was dx with Covid on 11/25. Took 3 days of Molnupiravir. Pt went back to urgent care on 12/1 due to lingering cough. CXR was negative and was given Benzonatate. Darrell states his dad is still having issues with cough and congestion. Is there anything else they can do or can he have an antibiotic sent in? Thank you/

## 2022-09-22 ENCOUNTER — Ambulatory Visit (INDEPENDENT_AMBULATORY_CARE_PROVIDER_SITE_OTHER): Payer: Medicare Other | Admitting: Family Medicine

## 2022-09-22 VITALS — BP 124/58 | HR 69 | Temp 97.8°F | Ht 71.0 in | Wt 175.0 lb

## 2022-09-22 DIAGNOSIS — R052 Subacute cough: Secondary | ICD-10-CM

## 2022-09-22 NOTE — Assessment & Plan Note (Signed)
Patient seen today for persistent cough and congestion, started Hycodan yesterday, will continue. No symptoms of pneumonia, this seems to be a subacute cough. His son also expressed concerns of generalized weakness and occasional hallucinations, no concerning findings on exam suggestive of stroke. I had a long discussion with this son regarding expected course of his covid illness and concerning findings to monitor for suggestive of pneumonia or stroke such as fever, shortness of breath, lethargy, confusion, unilateral weakness, facial droop, slurred speech. Encouraged son to continue to closely monitor him, allow him to rest, and maintain nutrition and hydration. Return to office if symptoms persist or worsen.

## 2022-09-22 NOTE — Progress Notes (Signed)
Acute Office Visit  Subjective:     Patient ID: Barry [name], male    DOB: 1933/03/03, 88 y.o.   MRN: [id number]  Chief Complaint  Patient presents with   Follow-up    S/P covid dx on 09/11/22. Still having cough and congestion. mjp    HPI Patient is in today for cough and congestion. Recently had Covid the Saturday after Thanksgiving, went to UC, took 3.5 days of Molnupiravir (couldn't tolerate the complete course), and symptoms are overall a little better. Started Hycodan last night and this AM without noticeable improvement. His son, who he lives with, reports confusion and hallucination over the last 2 weeks with worsening mobility. He is sleeping more than usual. He did fall 2x the Saturday after Thanksgiving, no LOC, could not move arms or legs immediately after the fall and did not go to the ER but was seen by EMS. Denies shortness of breath, fevers, chest pain, N/V/D, slurred speech, unilateral weakness, AMS, or facial droop.  Review of Systems  Reason unable to perform ROS: see HPI.  All other systems reviewed and are negative.  Past Medical History:  Diagnosis Date   AAA (abdominal aortic aneurysm) ([place]) 12/17/2007   4.7 cm   Allergy    rhinitis   Anxiety    Cancer ([place])    skin   Cataract    Depression    Diabetes mellitus    Diverticulosis    GERD (gastroesophageal reflux disease)    Hepatitis    Hx of colonic polyps 08/26/2015   Hyperlipidemia    Hypertension    Nephrolithiasis    Personal history of colonic polyps    adenomas, serrated also   PVD (peripheral vascular disease) ([place])    Stroke ([place])    Tobacco abuse    Past Surgical History:  Procedure Laterality Date   COLONOSCOPY  2006, 2008, 2010, 05/27/2011   numerous adenomas - 13 in 2006, 3, 2008, 2 2012 (up to 1 cm), 4 diminutive adenomas, serrated adenomas 2012. Diverticulosis and hemorrhoids also.   EYE SURGERY     EYE SURGERY Left    Kidney stone operation     lower limb amputation,  other toe 5th     percutaneous stent graft repair of infrarenal AAA  11/10   5.6 cm (T. early)   surgical excision basal cell carcinoma     TEE WITHOUT CARDIOVERSION N/A 07/29/2021   Procedure: TRANSESOPHAGEAL ECHOCARDIOGRAM (TEE);  Surgeon: Geralynn Rile, MD;  Location: [facility];  Service: Cardiovascular;  Laterality: N/A;   tympanic eardrum repair     VASECTOMY     Current Outpatient Medications on File Prior to Visit  Medication Sig Dispense Refill   acetaminophen (TYLENOL) 325 MG tablet Take 2 tablets (650 mg total) by mouth every 6 (six) hours as needed for mild pain (or Fever >/= 101).     amLODipine (NORVASC) 10 MG tablet Take 1 tablet (10 mg total) by mouth daily. 90 tablet 3   artificial tears (LACRILUBE) OINT ophthalmic ointment Place into both eyes every 4 (four) hours as needed for dry eyes.     B Complex-C (B-COMPLEX WITH VITAMIN C) tablet Take 1 tablet by mouth daily.     buPROPion (WELLBUTRIN XL) 150 MG 24 hr tablet TAKE 1 TABLET (150 MG TOTAL) BY MOUTH DAILY. 30 tablet 2   clonazePAM (KLONOPIN) 0.5 MG tablet TAKE 1/2 TABLET BY MOUTH 3 TIMES DAILY AS NEEDED FOR ANXIETY. 60 tablet 1   clopidogrel (PLAVIX)  75 MG tablet Take 1 tablet (75 mg total) by mouth daily. 90 tablet 3   diclofenac Sodium (VOLTAREN) 1 % GEL Apply 2 g topically 4 (four) times daily.     ezetimibe (ZETIA) 10 MG tablet TAKE 1 TABLET BY MOUTH DAILY 90 tablet 3   FLUoxetine (PROZAC) 10 MG capsule Take 1 capsule (10 mg total) by mouth daily. 90 capsule 2   FLUoxetine (PROZAC) 20 MG capsule TAKE (1) CAPSULE BY MOUTH EACH MORNING. 90 capsule 2   fluticasone (FLONASE) 50 MCG/ACT nasal spray Place 2 sprays into both nostrils daily. 16 g 6   gabapentin (NEURONTIN) 300 MG capsule Take 1 capsule (300 mg total) by mouth 2 (two) times daily as needed (nerve pain). 60 capsule 3   glipiZIDE (GLIPIZIDE XL) 5 MG 24 hr tablet Take 1 tablet (5 mg total) by mouth daily with breakfast. 90 tablet 3   hydrALAZINE (APRESOLINE)  100 MG tablet TAKE 1 TABLET BY MOUTH EVERY 8 HOURS. 90 tablet 7   HYDROcodone bit-homatropine (HYCODAN) 5-1.5 MG/5ML syrup Take 5 mLs by mouth every 8 (eight) hours as needed for cough. 120 mL 0   JANUVIA 100 MG tablet TAKE 1 TABLET (100 MG TOTAL) BY MOUTH DAILY. 30 tablet 3   metoprolol succinate (TOPROL-XL) 25 MG 24 hr tablet Take 0.5 tablets (12.5 mg total) by mouth daily. 90 tablet 3   montelukast (SINGULAIR) 10 MG tablet TAKE 1 TABLET (10 MG TOTAL) BY MOUTH AT BEDTIME. 90 tablet 1   rOPINIRole (REQUIP) 0.25 MG tablet Take 1 tablet (0.25 mg total) by mouth at bedtime. 90 tablet 3   rosuvastatin (CRESTOR) 20 MG tablet TAKE 1 TABLET BY MOUTH DAILY. 90 tablet 3   valsartan (DIOVAN) 160 MG tablet Take 1 tablet (160 mg total) by mouth 2 (two) times daily. 180 tablet 3   vitamin B-12 (CYANOCOBALAMIN) 1000 MCG tablet Take 1,000 mcg by mouth daily.     benzonatate (TESSALON) 100 MG capsule Take 1 capsule (100 mg total) by mouth every 8 (eight) hours as needed for cough. (Patient not taking: Reported on 09/22/2022) 21 capsule 0   magnesium gluconate (MAGONATE) 500 MG tablet Take 0.5 tablets (250 mg total) by mouth at bedtime. (Patient not taking: Reported on 09/22/2022)     sulfamethoxazole-trimethoprim (BACTRIM DS) 800-160 MG tablet Take 1 tablet by mouth 2 (two) times daily. (Patient not taking: Reported on 09/22/2022) 20 tablet 0   No current facility-administered medications on file prior to visit.   Allergies  Allergen Reactions   Lisinopril-Hydrochlorothiazide Swelling    ZESTRIL   Doxycycline Rash        Objective:    BP (!) 124/58   Pulse 69   Temp 97.8 F (36.6 C) (Oral)   Ht '5\' 11"'$  (1.803 m)   Wt 175 lb (79.4 kg)   SpO2 98%   BMI 24.41 kg/m    Physical Exam Vitals and nursing note reviewed.  Constitutional:      General: He is not in acute distress.    Appearance: Normal appearance. He is normal weight. He is not toxic-appearing.  HENT:     Head: Normocephalic and  atraumatic.  Eyes:     Conjunctiva/sclera:     Right eye: Right conjunctiva is injected.     Left eye: Left conjunctiva is injected.  Cardiovascular:     Rate and Rhythm: Normal rate and regular rhythm.     Pulses: Normal pulses.     Heart sounds: Murmur heard.  Pulmonary:  Effort: Pulmonary effort is normal.     Breath sounds: Normal breath sounds.  Abdominal:     General: Bowel sounds are normal.     Palpations: Abdomen is soft.  Skin:    General: Skin is warm and dry.     Capillary Refill: Capillary refill takes less than 2 seconds.  Neurological:     General: No focal deficit present.     Mental Status: He is alert and oriented to person, place, and time. Mental status is at baseline.     GCS: GCS eye subscore is 4. GCS verbal subscore is 5. GCS motor subscore is 6.     Cranial Nerves: No cranial nerve deficit, dysarthria or facial asymmetry.     Sensory: No sensory deficit.     Motor: Weakness (LLE baseline since prior stroke per son) present.     Comments: Remains in wheelchair  Psychiatric:        Mood and Affect: Mood normal.        Behavior: Behavior normal.        Thought Content: Thought content normal.        Judgment: Judgment normal.     No results found for any visits on 09/22/22.      Assessment & Plan:   Problem List Items Addressed This Visit       Other   Subacute cough - Primary    Patient seen today for persistent cough and congestion, started Hycodan yesterday, will continue. No symptoms of pneumonia, this seems to be a subacute cough. His son also expressed concerns of generalized weakness and occasional hallucinations, no concerning findings on exam suggestive of stroke. I had a long discussion with this son regarding expected course of his covid illness and concerning findings to monitor for suggestive of pneumonia or stroke such as fever, shortness of breath, lethargy, confusion, unilateral weakness, facial droop, slurred speech. Encouraged  son to continue to closely monitor him, allow him to rest, and maintain nutrition and hydration. Return to office if symptoms persist or worsen.       No orders of the defined types were placed in this encounter.   Return if symptoms worsen or fail to improve.  Rubie Maid, FNP

## 2022-09-23 ENCOUNTER — Emergency Department (HOSPITAL_COMMUNITY)
Admission: EM | Admit: 2022-09-23 | Discharge: 2022-09-23 | Disposition: A | Discharge status: Home / Self Care | Payer: Medicare Other | Attending: Emergency Medicine | Admitting: Emergency Medicine

## 2022-09-23 ENCOUNTER — Emergency Department (HOSPITAL_COMMUNITY): Payer: Medicare Other

## 2022-09-23 ENCOUNTER — Other Ambulatory Visit: Payer: Self-pay

## 2022-09-23 DIAGNOSIS — R442 Other hallucinations: Secondary | ICD-10-CM | POA: Diagnosis not present

## 2022-09-23 DIAGNOSIS — R4182 Altered mental status, unspecified: Secondary | ICD-10-CM | POA: Diagnosis not present

## 2022-09-23 DIAGNOSIS — R9431 Abnormal electrocardiogram [ECG] [EKG]: Secondary | ICD-10-CM | POA: Diagnosis not present

## 2022-09-23 DIAGNOSIS — U071 COVID-19: Secondary | ICD-10-CM | POA: Diagnosis not present

## 2022-09-23 DIAGNOSIS — Z7984 Long term (current) use of oral hypoglycemic drugs: Secondary | ICD-10-CM | POA: Insufficient documentation

## 2022-09-23 DIAGNOSIS — N390 Urinary tract infection, site not specified: Secondary | ICD-10-CM | POA: Diagnosis not present

## 2022-09-23 DIAGNOSIS — R531 Weakness: Secondary | ICD-10-CM | POA: Diagnosis not present

## 2022-09-23 DIAGNOSIS — R059 Cough, unspecified: Secondary | ICD-10-CM | POA: Diagnosis present

## 2022-09-23 DIAGNOSIS — B9689 Other specified bacterial agents as the cause of diseases classified elsewhere: Secondary | ICD-10-CM | POA: Diagnosis not present

## 2022-09-23 DIAGNOSIS — E119 Type 2 diabetes mellitus without complications: Secondary | ICD-10-CM | POA: Insufficient documentation

## 2022-09-23 DIAGNOSIS — I959 Hypotension, unspecified: Secondary | ICD-10-CM | POA: Diagnosis not present

## 2022-09-23 DIAGNOSIS — I10 Essential (primary) hypertension: Secondary | ICD-10-CM | POA: Diagnosis not present

## 2022-09-23 LAB — URINALYSIS, ROUTINE W REFLEX MICROSCOPIC
Bilirubin Urine: NEGATIVE
Glucose, UA: NEGATIVE mg/dL
Hgb urine dipstick: NEGATIVE
Ketones, ur: NEGATIVE mg/dL
Nitrite: NEGATIVE
Protein, ur: 100 mg/dL — AB
Specific Gravity, Urine: 1.015 (ref 1.005–1.030)
WBC, UA: >50 WBC/hpf — ABNORMAL HIGH (ref 0–5)
pH: 5 (ref 5.0–8.0)

## 2022-09-23 LAB — BASIC METABOLIC PANEL
Anion gap: 10 (ref 5–15)
BUN: 23 mg/dL (ref 8–23)
CO2: 24 mmol/L (ref 22–32)
Calcium: 8.7 mg/dL — ABNORMAL LOW (ref 8.9–10.3)
Chloride: 101 mmol/L (ref 98–111)
Creatinine, Ser: 1.36 mg/dL — ABNORMAL HIGH (ref 0.61–1.24)
GFR, Estimated: 50 mL/min — ABNORMAL LOW (ref >60)
Glucose, Bld: 243 mg/dL — ABNORMAL HIGH (ref 70–99)
Potassium: 4.2 mmol/L (ref 3.5–5.1)
Sodium: 135 mmol/L (ref 135–145)

## 2022-09-23 LAB — CBC
HCT: 35.9 % — ABNORMAL LOW (ref 39.0–52.0)
Hemoglobin: 11.6 g/dL — ABNORMAL LOW (ref 13.0–17.0)
MCH: 30.5 pg (ref 26.0–34.0)
MCHC: 32.3 g/dL (ref 30.0–36.0)
MCV: 94.5 fL (ref 80.0–100.0)
Platelets: 360 10*3/uL (ref 150–400)
RBC: 3.8 MIL/uL — ABNORMAL LOW (ref 4.22–5.81)
RDW: 13.3 % (ref 11.5–15.5)
WBC: 20.8 10*3/uL — ABNORMAL HIGH (ref 4.0–10.5)
nRBC: 0 % (ref 0.0–0.2)

## 2022-09-23 LAB — SARS CORONAVIRUS 2 BY RT PCR: SARS Coronavirus 2 by RT PCR: POSITIVE — AB

## 2022-09-23 LAB — CBG MONITORING, ED: Glucose-Capillary: 236 mg/dL — ABNORMAL HIGH (ref 70–99)

## 2022-09-23 MED ORDER — SODIUM CHLORIDE 0.9 % IV SOLN
1.0000 g | Freq: Once | INTRAVENOUS | Status: AC
  Administered 2022-09-23: 1 g via INTRAVENOUS
  Filled 2022-09-23: qty 10

## 2022-09-23 MED ORDER — CEPHALEXIN 500 MG PO CAPS: 500.0000 mg | ORAL_CAPSULE | Freq: Three times a day (TID) | ORAL | 0 refills | Status: AC

## 2022-09-23 NOTE — Discharge Instructions (Addendum)
You were seen in the ER today for a UTI and COVID-19. Based on your lab results, you have a clear urinary tract infection that we will treat with antibiotics at home. A 10 day course of Keflex has been sent to your pharmacy that you should take three times daily until completed. Your urine sample was sent in for culture to determine if any changes in the antibiotic therapy need to be made so be aware that a new antibiotic may be called in based on those results which typically take a few days to result.  If symptoms worsen or you feel more confused/disoriented, please return back to the ER.

## 2022-09-23 NOTE — ED Triage Notes (Signed)
EMS stated, dx with COVID  a week ago, today so weak unable to get out of bed this morning.

## 2022-09-23 NOTE — ED Triage Notes (Signed)
Pt. Stated, I was positive for COVID and today I just feel sick and feel so weak.

## 2022-09-23 NOTE — ED Provider Notes (Signed)
[facility] EMERGENCY DEPARTMENT Provider Note   CSN: [id number] Arrival date & time: 09/23/22  2979     History Chief Complaint  Patient presents with   Weakness   Nasal Congestion    Barry [name] is a 88 y.o. male.   Weakness Associated symptoms: cough   Associated symptoms: no abdominal pain, no chest pain, no diarrhea, no dysuria, no frequency, no nausea, no shortness of breath and no vomiting    Patient presents to the ER following one day of weakness. Patient tested positive for COVID-19 2 days after Thanksgiving and recently began to behave differently according to son. Son reports that patient has appeared more confused and speaking incoherently at times with one episode of enuresis yesterday that patient does not recall. Patient was treated with monoclonal antibodies for covid infection but was unable to tolerate treatment. Antiviral not given due to decreased kidney function, per son.    Home Medications Prior to Admission medications   Medication Sig Start Date End Date Taking? Authorizing Provider  cephALEXin (KEFLEX) 500 MG capsule Take 1 capsule (500 mg total) by mouth 3 (three) times daily for 10 days. 09/23/22 10/03/22 Yes Luvenia Heller, PA-C  acetaminophen (TYLENOL) 325 MG tablet Take 2 tablets (650 mg total) by mouth every 6 (six) hours as needed for mild pain (or Fever >/= 101). 08/24/21   Angiulli, Lavon Paganini, PA-C  amLODipine (NORVASC) 10 MG tablet Take 1 tablet (10 mg total) by mouth daily. 10/26/21   Susy Frizzle, MD  artificial tears (LACRILUBE) OINT ophthalmic ointment Place into both eyes every 4 (four) hours as needed for dry eyes. 08/24/21   Angiulli, Lavon Paganini, PA-C  B Complex-C (B-COMPLEX WITH VITAMIN C) tablet Take 1 tablet by mouth daily. 08/25/21   Angiulli, Lavon Paganini, PA-C  benzonatate (TESSALON) 100 MG capsule Take 1 capsule (100 mg total) by mouth every 8 (eight) hours as needed for cough. Patient not taking: Reported on  09/22/2022 09/17/22   Teodora Medici, FNP  buPROPion (WELLBUTRIN XL) 150 MG 24 hr tablet TAKE 1 TABLET (150 MG TOTAL) BY MOUTH DAILY. 08/03/22   Susy Frizzle, MD  clonazePAM (KLONOPIN) 0.5 MG tablet TAKE 1/2 TABLET BY MOUTH 3 TIMES DAILY AS NEEDED FOR ANXIETY. 07/09/22   Cloria Spring, MD  clopidogrel (PLAVIX) 75 MG tablet Take 1 tablet (75 mg total) by mouth daily. 10/06/21   Susy Frizzle, MD  diclofenac Sodium (VOLTAREN) 1 % GEL Apply 2 g topically 4 (four) times daily. 08/24/21   Angiulli, Lavon Paganini, PA-C  ezetimibe (ZETIA) 10 MG tablet TAKE 1 TABLET BY MOUTH DAILY 10/06/21   Susy Frizzle, MD  FLUoxetine (PROZAC) 10 MG capsule Take 1 capsule (10 mg total) by mouth daily. 12/15/21 12/15/22  Cloria Spring, MD  FLUoxetine (PROZAC) 20 MG capsule TAKE (1) CAPSULE BY MOUTH EACH MORNING. 12/15/21   Cloria Spring, MD  fluticasone [facility]) 50 MCG/ACT nasal spray Place 2 sprays into both nostrils daily. 10/02/21   Susy Frizzle, MD  gabapentin (NEURONTIN) 300 MG capsule Take 1 capsule (300 mg total) by mouth 2 (two) times daily as needed (nerve pain). 05/17/22   Susy Frizzle, MD  glipiZIDE (GLIPIZIDE XL) 5 MG 24 hr tablet Take 1 tablet (5 mg total) by mouth daily with breakfast. 01/04/22   Susy Frizzle, MD  hydrALAZINE (APRESOLINE) 100 MG tablet TAKE 1 TABLET BY MOUTH EVERY 8 HOURS. 07/09/22   Susy Frizzle, MD  HYDROcodone bit-homatropine (HYCODAN) 5-1.5 MG/5ML syrup Take 5 mLs by mouth every 8 (eight) hours as needed for cough. 09/21/22   Susy Frizzle, MD  JANUVIA 100 MG tablet TAKE 1 TABLET (100 MG TOTAL) BY MOUTH DAILY. 06/22/22   Susy Frizzle, MD  magnesium gluconate (MAGONATE) 500 MG tablet Take 0.5 tablets (250 mg total) by mouth at bedtime. Patient not taking: Reported on 09/22/2022 08/24/21   Angiulli, Lavon Paganini, PA-C  metoprolol succinate (TOPROL-XL) 25 MG 24 hr tablet Take 0.5 tablets (12.5 mg total) by mouth daily. 10/06/21   Susy Frizzle, MD  montelukast  (SINGULAIR) 10 MG tablet TAKE 1 TABLET (10 MG TOTAL) BY MOUTH AT BEDTIME. 06/22/22   Susy Frizzle, MD  rOPINIRole (REQUIP) 0.25 MG tablet Take 1 tablet (0.25 mg total) by mouth at bedtime. 10/13/21   Susy Frizzle, MD  rosuvastatin (CRESTOR) 20 MG tablet TAKE 1 TABLET BY MOUTH DAILY. 09/20/22   Susy Frizzle, MD  sulfamethoxazole-trimethoprim (BACTRIM DS) 800-160 MG tablet Take 1 tablet by mouth 2 (two) times daily. Patient not taking: Reported on 09/22/2022 01/04/22   Susy Frizzle, MD  valsartan (DIOVAN) 160 MG tablet Take 1 tablet (160 mg total) by mouth 2 (two) times daily. 12/25/21   Susy Frizzle, MD  vitamin B-12 (CYANOCOBALAMIN) 1000 MCG tablet Take 1,000 mcg by mouth daily.    [provider]      Allergies    Lisinopril-hydrochlorothiazide and Doxycycline    Review of Systems   Review of Systems  Constitutional:  Positive for activity change.  HENT:  Positive for sore throat.   Eyes:  Positive for discharge and redness.  Respiratory:  Positive for cough. Negative for shortness of breath.   Cardiovascular:  Negative for chest pain.  Gastrointestinal:  Negative for abdominal pain, diarrhea, nausea and vomiting.  Genitourinary:  Negative for dysuria, frequency and hematuria.  Neurological:  Positive for weakness.  All other systems reviewed and are negative.   Physical Exam Updated Vital Signs BP (!) 146/75   Pulse 73   Temp 98 F (36.7 C) (Oral)   Resp 17   SpO2 100%  Physical Exam Vitals and nursing note reviewed.  Constitutional:      Appearance: Normal appearance. He is normal weight.  HENT:     Head: Normocephalic and atraumatic.  Eyes:     Conjunctiva/sclera: Conjunctivae normal.     Pupils: Pupils are equal, round, and reactive to light.  Cardiovascular:     Rate and Rhythm: Normal rate and regular rhythm.  Pulmonary:     Effort: Pulmonary effort is normal.     Breath sounds: Normal breath sounds.  Abdominal:     General: Abdomen  is flat.  Musculoskeletal:        General: Normal range of motion.  Skin:    General: Skin is warm and dry.  Neurological:     Mental Status: He is alert.     ED Results / Procedures / Treatments   Labs (all labs ordered are listed, but only abnormal results are displayed) Labs Reviewed  SARS CORONAVIRUS 2 BY RT PCR - Abnormal; Notable for the following components:      Result Value   SARS Coronavirus 2 by RT PCR POSITIVE (*)    All other components within normal limits  BASIC METABOLIC PANEL - Abnormal; Notable for the following components:   Glucose, Bld 243 (*)    Creatinine, Ser 1.36 (*)    Calcium 8.7 (*)  GFR, Estimated 50 (*)    All other components within normal limits  CBC - Abnormal; Notable for the following components:   WBC 20.8 (*)    RBC 3.80 (*)    Hemoglobin 11.6 (*)    HCT 35.9 (*)    All other components within normal limits  URINALYSIS, ROUTINE W REFLEX MICROSCOPIC - Abnormal; Notable for the following components:   Color, Urine AMBER (*)    APPearance CLOUDY (*)    Protein, ur 100 (*)    Leukocytes,Ua MODERATE (*)    WBC, UA >50 (*)    Bacteria, UA MANY (*)    All other components within normal limits  CBG MONITORING, ED - Abnormal; Notable for the following components:   Glucose-Capillary 236 (*)    All other components within normal limits  URINE CULTURE    EKG None  Radiology CT Head Wo Contrast  Result Date: 09/23/2022 CLINICAL DATA:  Mental status change, unknown cause EXAM: CT HEAD WITHOUT CONTRAST TECHNIQUE: Contiguous axial images were obtained from the base of the skull through the vertex without intravenous contrast. RADIATION DOSE REDUCTION: This exam was performed according to the departmental dose-optimization program which includes automated exposure control, adjustment of the mA and/or kV according to patient size and/or use of iterative reconstruction technique. COMPARISON:  10/05/2021. FINDINGS: Brain: There is periventricular  white matter decreased attenuation consistent with small vessel ischemic changes. Ventricles, sulci and cisterns are prominent consistent with age related involutional changes. No acute intracranial hemorrhage, mass effect or shift. No hydrocephalus. Bilateral basal ganglia encephalomalacia consistent with old lacunar CVAs. Vascular: Dense atheromatous calcifications of the intra cerebral vasculature noted. Skull: Normal. Negative for fracture or focal lesion. Sinuses/Orbits: Extensive sinus opacification with aeration only of a portion of the frontal and ethmoid sinuses. Findings are consistent with sinonasal polyposis or severe chronic pansinusitis. IMPRESSION: 1. Atrophy and chronic small vessel ischemic changes. 2. Chronic basal ganglia lacunar CVAs. 3. Severe chronic pansinusitis versus sinonasal polyposis. Electronically Signed   By: Sammie Bench M.D.   On: 09/23/2022 18:28   DG Chest 2 View  Result Date: 09/23/2022 CLINICAL DATA:  Weakness. EXAM: CHEST - 2 VIEW COMPARISON:  September 17, 2022. FINDINGS: The heart size and mediastinal contours are within normal limits. Both lungs are clear. The visualized skeletal structures are unremarkable. IMPRESSION: No active cardiopulmonary disease. Electronically Signed   By: Marijo Conception M.D.   On: 09/23/2022 10:51    Procedures Procedures   Medications Ordered in ED Medications  cefTRIAXone (ROCEPHIN) 1 g in sodium chloride 0.9 % 100 mL IVPB (0 g Intravenous Stopped 09/23/22 2000)    ED Course/ Medical Decision Making/ A&P Clinical Course as of 09/23/22 2100  Thu Sep 23, 2022  1744 Bacteria, UA(!): MANY [OZ]    Clinical Course User Index [OZ] Luvenia Heller, PA-C                           Medical Decision Making Amount and/or Complexity of Data Reviewed Labs:  Decision-making details documented in ED Course.   This patient presents to the ED for concern of UTI, this involves an extensive number of treatment options, and is a complaint  that carries with it a high risk of complications and morbidity.  The differential diagnosis includes but not limited to COVID-19, arrhythmia, PE, viral URI, or dehydration.   Co morbidities that complicate the patient evaluation  Type 2 diabetes, current COVID-19 infection,  Additional history obtained:  Additional history obtained from son   Lab Tests:  I Ordered, and personally interpreted labs.  The pertinent results include:  Positive COVID-19, elevated WBC 20.8, and UA consistent with UTI   Imaging Studies ordered:  I ordered imaging studies including CT head, Chest xray  I independently visualized and interpreted imaging which showed no active cardiopulmonary disease, no acute changes I agree with the radiologist interpretation    Problem List / ED Course / Critical interventions / Medication management  Patient presented to the ER with his son with 1 day of generalized weakness.  Patient's son reported that patient tested positive for COVID-19 2 days after Thanksgiving and has been symptomatic since that time.  He states that in the last few days patient appears to be more confused, disoriented, and possibly experiencing verbal/auditory hallucinations as he is talking with things that are not present in the room.  Patient's workup was concerning for current COVID-19 infection as well as urinary tract infection based on urinalysis results.  I shared these findings with the patient and his son and we discussed treatment options.  Based on patient's availability for care at home from those around him and his ability to swallow and take medications by mouth, I believe it is currently beneficial to perform antibiotic therapy in the outpatient setting.  10-day course of Keflex was sent into pharmacy.  Advised patient and his son of this plan as well as return precautions and they were agreeable. I have reviewed the patients home medicines and have made adjustments as  needed   Final Clinical Impression(s) / ED Diagnoses Final diagnoses:  Urinary tract infection without hematuria, site unspecified  COVID-19    Rx / DC Orders ED Discharge Orders          Ordered    cephALEXin (KEFLEX) 500 MG capsule  3 times daily        09/23/22 1938              Luvenia Heller, PA-C 09/23/22 2101    Noemi Chapel, MD 09/25/22 1200

## 2022-09-23 NOTE — ED Provider Triage Note (Signed)
Emergency Medicine Provider Triage Evaluation Note  Barry [name] , a 88 y.o. male  was evaluated in triage.  Pt complains of weakness onset 1 week.  Patient notes that he was recently diagnosed with COVID prior to the onset of his symptoms.  Notes he has been taking Sudafed at home.  Denies chest pain, shortness of breath, abdominal pain, nausea, vomiting, dizziness, lightheadedness.  Review of Systems  Positive:  Negative:   Physical Exam  BP 119/71 (BP Location: Left Arm)   Pulse 91   Temp 100.2 F (37.9 C)   Resp 18   SpO2 96%  Gen:   Awake, no distress   Resp:  Normal effort MSK:   Moves extremities without difficulty  Other:    Medical Decision Making  Medically screening exam initiated at 10:42 AM.  Appropriate orders placed.  Barry [name] [facility] was informed that the remainder of the evaluation will be completed by another provider, this initial triage assessment does not replace that evaluation, and the importance of remaining in the ED until their evaluation is complete.     Barry [name] A, PA-C 09/23/22 1042

## 2022-09-23 NOTE — ED Notes (Signed)
Spoke with [place], [place]. Informed her that the patient's son reported to me that he has been having some confusion as well. CT head ordered.

## 2022-09-23 NOTE — ED Notes (Signed)
Pt re-evaluated. Son sitting with patient. NAD

## 2022-09-23 NOTE — ED Notes (Signed)
Due to Dinamap error pt o2 sat was not 86% on room air it was 96.

## 2022-09-23 NOTE — ED Provider Notes (Signed)
This patient is a very well-appearing 88 year old male, although he is chronically ill he does not appear to be in distress.  According to the son who is the primary historian he has had COVID recently approximately 2 weeks ago during which time he became quite symptomatic and has gradually been improving.  Over the last 24 hours she is started to have mild hallucinations and confusion and some increasing general weakness.  The patient has not had any fevers or vomiting and has been eating and drinking well according to the son.  The patient denies any complaints at this time.  On my exam he has no abnormal lung sounds, he has no difficulty breathing, his vital signs reflect an oxygen of 96% on room air in the room blood pressure of 140/67 with a pulse of 73.  He does have a urinary infection with many bacteria and greater than 50 white blood cells.  A culture will be sent.  His kidney function is at baseline, creatinine of 1.36, it was 1.322 weeks ago and it was 1.34 in March 2023.  He is slightly hyperglycemic but has no electrolyte abnormalities.  Of note he does have what appears to be a leukocytosis at almost 21,000, he is not significantly anemic.  He still test positive for COVID but has a negative x-ray, he has a CT scan of his head which does not show any specific new acute findings.  The son does report that he has had a stroke and has been healing from the left-sided deficits.  There has been several months of a gradual decline but he is still living at home.  The son states he has been able to walk with assistance.  I went over the plan of antibiotics and home, the son and the patient are in agreement.  The patient does not appear to be septic though he does have some signs of infection.  He is taking pills without vomiting, we will start with a gram of Rocephin and discharged with cephalexin.  Culture pending  The family is in agreement with the plan  Medical screening  examination/treatment/procedure(s) were conducted as a shared visit with non-physician practitioner(s) and myself.  I personally evaluated the patient during the encounter.  Clinical Impression:   Final diagnoses:  None         Noemi Chapel, MD 09/25/22 1200

## 2022-09-23 NOTE — ED Notes (Signed)
Lab called at this time and stated that they are able to add on urine culture to urine already present in lab.

## 2022-09-26 LAB — URINE CULTURE: Culture: >=100000 — AB

## 2022-09-27 ENCOUNTER — Telehealth (HOSPITAL_BASED_OUTPATIENT_CLINIC_OR_DEPARTMENT_OTHER): Payer: Self-pay

## 2022-09-27 ENCOUNTER — Other Ambulatory Visit: Payer: Self-pay | Admitting: Family Medicine

## 2022-09-27 ENCOUNTER — Telehealth: Payer: Self-pay

## 2022-09-27 ENCOUNTER — Telehealth (HOSPITAL_BASED_OUTPATIENT_CLINIC_OR_DEPARTMENT_OTHER): Payer: Self-pay | Admitting: Emergency Medicine

## 2022-09-27 MED ORDER — SULFAMETHOXAZOLE-TRIMETHOPRIM 800-160 MG PO TABS: 1.0000 | ORAL_TABLET | Freq: Two times a day (BID) | ORAL | 0 refills | Status: DC

## 2022-09-27 NOTE — Progress Notes (Signed)
ED Antimicrobial Stewardship Positive Culture Follow Up   Barry [name] is an 88 y.o. male who presented to [facility] on 09/23/2022 with a chief complaint of  Chief Complaint  Patient presents with   Weakness   Nasal Congestion    Recent Results (from the past 720 hour(s))  SARS Coronavirus 2 by RT PCR (hospital order, performed in [facility] hospital lab) *cepheid single result test* Anterior Nasal Swab     Status: Abnormal   Collection Time: 09/23/22 10:21 AM   Specimen: Anterior Nasal Swab  Result Value Ref Range Status   SARS Coronavirus 2 by RT PCR POSITIVE (A) NEGATIVE Final    Comment: (NOTE) SARS-CoV-2 target nucleic acids are DETECTED  SARS-CoV-2 RNA is generally detectable in upper respiratory specimens  during the acute phase of infection.  Positive results are indicative  of the presence of the identified virus, but do not rule out bacterial infection or co-infection with other pathogens not detected by the test.  Clinical correlation with patient history and  other diagnostic information is necessary to determine patient infection status.  The expected result is negative.  Fact Sheet for Patients:   https://www.patel.info/   Fact Sheet for Healthcare Providers:   https://hall.com/    This test is not yet approved or cleared by the [place] FDA and  has been authorized for detection and/or diagnosis of SARS-CoV-2 by FDA under an Emergency Use Authorization (EUA).  This EUA will remain in effect (meaning this test can be used) for the duration of  the COVID-19 declaration under Section 564(b)(1)  of the Act, 21 U.S.C. section 360-bbb-3(b)(1), unless the authorization is terminated or revoked sooner.   Performed at [facility], [place] [address] [place], [place] [address]   Urine Culture     Status: Abnormal   Collection Time: 09/23/22  7:05 PM   Specimen: Urine, Clean Catch  Result Value Ref Range  Status   Specimen Description URINE, CLEAN CATCH  Final   Special Requests   Final    NONE Performed at [facility], [place] [address] [place], [place] [address]    Culture (A)  Final    >=100,000 COLONIES/mL KLEBSIELLA PNEUMONIAE Confirmed Extended Spectrum Beta-Lactamase Producer (ESBL).  In bloodstream infections from ESBL organisms, carbapenems are preferred over piperacillin/tazobactam. They are shown to have a lower risk of mortality.    Report Status 09/26/2022 FINAL  Final   Organism ID, Bacteria KLEBSIELLA PNEUMONIAE (A)  Final      Susceptibility   Klebsiella pneumoniae - MIC*    AMPICILLIN >=32 RESISTANT Resistant     CEFAZOLIN >=64 RESISTANT Resistant     CEFEPIME >=32 RESISTANT Resistant     CEFTRIAXONE >=64 RESISTANT Resistant     CIPROFLOXACIN 0.5 INTERMEDIATE Intermediate     GENTAMICIN <=1 SENSITIVE Sensitive     IMIPENEM <=0.25 SENSITIVE Sensitive     NITROFURANTOIN 32 SENSITIVE Sensitive     TRIMETH/SULFA <=20 SENSITIVE Sensitive     AMPICILLIN/SULBACTAM 8 SENSITIVE Sensitive     PIP/TAZO <=4 SENSITIVE Sensitive     * >=100,000 COLONIES/mL KLEBSIELLA PNEUMONIAE    '[x]'$  Treated with Keflex, organism resistant to prescribed antimicrobial '[]'$  Patient discharged originally without antimicrobial agent and treatment is now indicated  New antibiotic prescription: Augmentin  ED Provider: Margarita Mail, PA-C   [name] 09/27/2022, 7:15 AM Clinical Pharmacist Monday - Friday phone -  [phone] Saturday - Sunday phone - [phone]

## 2022-09-27 NOTE — Telephone Encounter (Signed)
Pt was seen at [facility] on 09/23/22 and was dx with UTI. Pt's son, [name] asks if you would look at the culture results and make sure pt is on the correct abx? Pt was given IV Rocephin in ER and a 10-day course of Keflex was sent into pharmacy. Thank you.

## 2022-09-27 NOTE — Telephone Encounter (Signed)
Spoke with Darrell and informed per drs notes and recommendations, he verbalizes understanding.

## 2022-10-01 ENCOUNTER — Other Ambulatory Visit (HOSPITAL_COMMUNITY): Payer: Self-pay | Admitting: Psychiatry

## 2022-10-01 ENCOUNTER — Other Ambulatory Visit: Payer: Self-pay | Admitting: Family Medicine

## 2022-10-01 NOTE — Telephone Encounter (Signed)
PT NEED CPE APPT W/PCP FOR FUTURE REFILLS  

## 2022-10-05 ENCOUNTER — Other Ambulatory Visit (HOSPITAL_COMMUNITY): Payer: Self-pay | Admitting: Psychiatry

## 2022-10-07 ENCOUNTER — Other Ambulatory Visit: Payer: Self-pay

## 2022-10-08 MED ORDER — CLONAZEPAM 0.5 MG PO TABS: ORAL_TABLET | ORAL | 1 refills | Status: DC

## 2022-10-08 MED ORDER — FLUOXETINE HCL 20 MG PO CAPS: ORAL_CAPSULE | ORAL | 2 refills | Status: DC

## 2022-10-15 NOTE — Telephone Encounter (Signed)
CPE scheduled for 11/15/22.

## 2022-10-22 ENCOUNTER — Other Ambulatory Visit: Payer: Self-pay | Admitting: Family Medicine

## 2022-10-22 ENCOUNTER — Telehealth: Payer: Self-pay

## 2022-10-22 MED ORDER — OSELTAMIVIR PHOSPHATE 75 MG PO CAPS: 75.0000 mg | ORAL_CAPSULE | Freq: Two times a day (BID) | ORAL | 0 refills | Status: DC

## 2022-10-22 NOTE — Telephone Encounter (Signed)
Pt's son [name] called and states that he and his family have had the flu. Darrell asks if an rx for Tamilflu can be sent in to pharmacy in case his Dad develops the flu?

## 2022-10-25 ENCOUNTER — Telehealth: Payer: Self-pay

## 2022-10-25 ENCOUNTER — Other Ambulatory Visit: Payer: Self-pay

## 2022-10-25 DIAGNOSIS — I10 Essential (primary) hypertension: Secondary | ICD-10-CM

## 2022-10-25 DIAGNOSIS — E119 Type 2 diabetes mellitus without complications: Secondary | ICD-10-CM

## 2022-10-25 DIAGNOSIS — I7143 Infrarenal abdominal aortic aneurysm, without rupture: Secondary | ICD-10-CM

## 2022-10-25 DIAGNOSIS — I639 Cerebral infarction, unspecified: Secondary | ICD-10-CM

## 2022-10-25 DIAGNOSIS — I2510 Atherosclerotic heart disease of native coronary artery without angina pectoris: Secondary | ICD-10-CM

## 2022-10-25 DIAGNOSIS — I350 Nonrheumatic aortic (valve) stenosis: Secondary | ICD-10-CM

## 2022-10-25 MED ORDER — AMLODIPINE BESYLATE 10 MG PO TABS: 10.0000 mg | ORAL_TABLET | Freq: Every day | ORAL | 1 refills | Status: DC

## 2022-10-25 MED ORDER — SITAGLIPTIN PHOSPHATE 100 MG PO TABS: 100.0000 mg | ORAL_TABLET | Freq: Every day | ORAL | 1 refills | Status: DC

## 2022-10-25 MED ORDER — CLOPIDOGREL BISULFATE 75 MG PO TABS: 75.0000 mg | ORAL_TABLET | Freq: Every day | ORAL | 1 refills | Status: DC

## 2022-10-25 NOTE — Telephone Encounter (Signed)
Pt's son called in concerned that pt will be out of his meds before his scheduled cpe on 11/15/22. Pt's son would like the nurse to cb to give the meds that pt is going to be out of. Please advise.  Cb#: Barry [name], pt's son [phone]

## 2022-10-28 ENCOUNTER — Other Ambulatory Visit: Payer: Self-pay | Admitting: Family Medicine

## 2022-11-15 ENCOUNTER — Ambulatory Visit (INDEPENDENT_AMBULATORY_CARE_PROVIDER_SITE_OTHER): Payer: Medicare Other | Admitting: Family Medicine

## 2022-11-15 ENCOUNTER — Encounter: Payer: Self-pay | Admitting: Family Medicine

## 2022-11-15 VITALS — BP 122/60 | HR 60 | Temp 97.9°F | Ht 71.0 in | Wt 167.4 lb

## 2022-11-15 DIAGNOSIS — I10 Essential (primary) hypertension: Secondary | ICD-10-CM

## 2022-11-15 DIAGNOSIS — I2510 Atherosclerotic heart disease of native coronary artery without angina pectoris: Secondary | ICD-10-CM | POA: Diagnosis not present

## 2022-11-15 DIAGNOSIS — M25512 Pain in left shoulder: Secondary | ICD-10-CM

## 2022-11-15 DIAGNOSIS — E119 Type 2 diabetes mellitus without complications: Secondary | ICD-10-CM | POA: Diagnosis not present

## 2022-11-15 DIAGNOSIS — E7800 Pure hypercholesterolemia, unspecified: Secondary | ICD-10-CM

## 2022-11-15 DIAGNOSIS — I6930 Unspecified sequelae of cerebral infarction: Secondary | ICD-10-CM | POA: Diagnosis not present

## 2022-11-15 DIAGNOSIS — I639 Cerebral infarction, unspecified: Secondary | ICD-10-CM

## 2022-11-15 DIAGNOSIS — Z0000 Encounter for general adult medical examination without abnormal findings: Secondary | ICD-10-CM

## 2022-11-15 NOTE — Progress Notes (Signed)
Subjective:    Patient ID: Barry [name], male    DOB: Aug 15, 1933, 89 y.o.   MRN: [id number]  HPI  Patient is a very pleasant 89 year old Caucasian gentleman with a history of stroke in 2022, coronary artery disease, hypertension, hyperlipidemia, and type 2 diabetes mellitus.  He suffers from chronic vertigo every time he stands up.  He also has decreased dexterity in his left upper extremity from stroke.  Due to his frail condition, we have not pursued further treatment for AAA.  He also has a history of moderate aortic stenosis as well as carotid artery disease however we have elected not to screen for this with echocardiograms or ultrasound as the patient is a high risk surgical candidate.  I discussed this again with the patient and his son and they both elected not to proceed with further screening at the present time given his frail condition.  His blood pressure today is excellent.  He is due for an RSV shot as well as the shingles vaccine.  Obviously were not screening for colon cancer or prostate cancer. Past Medical History:  Diagnosis Date   AAA (abdominal aortic aneurysm) ([place]) 12/17/2007   4.7 cm   Allergy    rhinitis   Anxiety    Cancer ([place])    skin   Cataract    Depression    Diabetes mellitus    Diverticulosis    GERD (gastroesophageal reflux disease)    Hepatitis    Hx of colonic polyps 08/26/2015   Hyperlipidemia    Hypertension    Nephrolithiasis    Personal history of colonic polyps    adenomas, serrated also   PVD (peripheral vascular disease) ([place])    Stroke ([place])    Tobacco abuse    Past Surgical History:  Procedure Laterality Date   COLONOSCOPY  2006, 2008, 2010, 05/27/2011   numerous adenomas - 13 in 2006, 3, 2008, 2 2012 (up to 1 cm), 4 diminutive adenomas, serrated adenomas 2012. Diverticulosis and hemorrhoids also.   EYE SURGERY     EYE SURGERY Left    Kidney stone operation     lower limb amputation, other toe 5th     percutaneous stent graft  repair of infrarenal AAA  11/10   5.6 cm (T. early)   surgical excision basal cell carcinoma     TEE WITHOUT CARDIOVERSION N/A 07/29/2021   Procedure: TRANSESOPHAGEAL ECHOCARDIOGRAM (TEE);  Surgeon: Geralynn Rile, MD;  Location: [facility];  Service: Cardiovascular;  Laterality: N/A;   tympanic eardrum repair     VASECTOMY     Current Outpatient Medications on File Prior to Visit  Medication Sig Dispense Refill   oseltamivir (TAMIFLU) 75 MG capsule Take 1 capsule (75 mg total) by mouth 2 (two) times daily. 10 capsule 0   acetaminophen (TYLENOL) 325 MG tablet Take 2 tablets (650 mg total) by mouth every 6 (six) hours as needed for mild pain (or Fever >/= 101).     amLODipine (NORVASC) 10 MG tablet Take 1 tablet (10 mg total) by mouth daily. 90 tablet 1   artificial tears (LACRILUBE) OINT ophthalmic ointment Place into both eyes every 4 (four) hours as needed for dry eyes.     B Complex-C (B-COMPLEX WITH VITAMIN C) tablet Take 1 tablet by mouth daily.     buPROPion (WELLBUTRIN XL) 150 MG 24 hr tablet TAKE 1 TABLET (150 MG TOTAL) BY MOUTH DAILY. 30 tablet 1   clonazePAM (KLONOPIN) 0.5 MG tablet TAKE 1/2 TABLET  BY MOUTH 3 TIMES DAILY AS NEEDED FOR ANXIETY. 60 tablet 1   clopidogrel (PLAVIX) 75 MG tablet Take 1 tablet (75 mg total) by mouth daily. 30 tablet 1   ezetimibe (ZETIA) 10 MG tablet TAKE 1 TABLET BY MOUTH DAILY 90 tablet 3   FLUoxetine (PROZAC) 10 MG capsule Take 1 capsule (10 mg total) by mouth daily. 90 capsule 2   FLUoxetine (PROZAC) 20 MG capsule TAKE (1) CAPSULE BY MOUTH EACH MORNING. 90 capsule 2   fluticasone (FLONASE) 50 MCG/ACT nasal spray Place 2 sprays into both nostrils daily. 16 g 6   gabapentin (NEURONTIN) 300 MG capsule Take 1 capsule (300 mg total) by mouth 2 (two) times daily as needed (nerve pain). 60 capsule 3   glipiZIDE (GLIPIZIDE XL) 5 MG 24 hr tablet Take 1 tablet (5 mg total) by mouth daily with breakfast. 90 tablet 3   hydrALAZINE (APRESOLINE) 100 MG tablet  TAKE 1 TABLET BY MOUTH EVERY 8 HOURS. 90 tablet 7   metoprolol succinate (TOPROL-XL) 25 MG 24 hr tablet Take 0.5 tablets (12.5 mg total) by mouth daily. 90 tablet 3   montelukast (SINGULAIR) 10 MG tablet TAKE 1 TABLET (10 MG TOTAL) BY MOUTH AT BEDTIME. 90 tablet 1   rOPINIRole (REQUIP) 0.25 MG tablet Take 1 tablet (0.25 mg total) by mouth at bedtime. 90 tablet 3   rosuvastatin (CRESTOR) 20 MG tablet TAKE 1 TABLET BY MOUTH DAILY. 90 tablet 3   sitaGLIPtin (JANUVIA) 100 MG tablet Take 1 tablet (100 mg total) by mouth daily. 30 tablet 1   sulfamethoxazole-trimethoprim (BACTRIM DS) 800-160 MG tablet Take 1 tablet by mouth 2 (two) times daily. 20 tablet 0   valsartan (DIOVAN) 160 MG tablet Take 1 tablet (160 mg total) by mouth 2 (two) times daily. 180 tablet 3   vitamin B-12 (CYANOCOBALAMIN) 1000 MCG tablet Take 1,000 mcg by mouth daily.     No current facility-administered medications on file prior to visit.     Allergies  Allergen Reactions   Lisinopril-Hydrochlorothiazide Swelling    ZESTRIL   Doxycycline Rash   Social History   Socioeconomic History   Marital status: Widowed    Spouse name: Not on file   Number of children: 2   Years of education: Not on file   Highest education level: Not on file  Occupational History    Employer: RETIRED  Tobacco Use   Smoking status: Former    Packs/day: 1.00    Years: 60.00    Total pack years: 60.00    Types: Cigarettes   Smokeless tobacco: Never   Tobacco comments:    Hasn't smoked since he had his stroke   Vaping Use   Vaping Use: Never used  Substance and Sexual Activity   Alcohol use: No   Drug use: No   Sexual activity: Yes    Partners: Female    Birth control/protection: Surgical    Comment: vasectomy  Other Topics Concern   Not on file  Social History Narrative   11/11/21 living with son, [name]   HS[employer], 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 retired 32 years - keeping busy. Lives alone. 1- step-daughter, 1 son - '68; 3  grandchildren. 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  (12/25/2021)   Overall Financial Resource Strain (CARDIA)    Difficulty of Paying Living Expenses: Not hard at all  Food Insecurity: No Food Insecurity (12/25/2021)   Hunger Vital Sign  Worried About Charity fundraiser in the Last Year: Never true    [place] in the Last Year: Never true  Transportation Needs: No Transportation Needs (12/25/2021)   PRAPARE - Hydrologist (Medical): No    Lack of Transportation (Non-Medical): No  Physical Activity: Sufficiently Active (12/25/2021)   Exercise Vital Sign    Days of Exercise per Week: 3 days    Minutes of Exercise per Session: 60 min  Stress: Stress Concern Present (12/25/2021)   [place]    Feeling of Stress : To some extent  Social Connections: Moderately Isolated (12/25/2021)   Social Connection and Isolation Panel [NHANES]    Frequency of Communication with Friends and Family: More than three times a week    Frequency of Social Gatherings with Friends and Family: More than three times a week    Attends Religious Services: 1 to 4 times per year    Active Member of [employer] or Organizations: No    Attends Archivist Meetings: Never    Marital Status: Widowed  Intimate Partner Violence: Not At Risk (12/25/2021)   Humiliation, Afraid, Rape, and Kick questionnaire    Fear of Current or Ex-Partner: No    Emotionally Abused: No    Physically Abused: No    Sexually Abused: No     Review of Systems  All other systems reviewed and are negative.      Objective:   Physical Exam Vitals reviewed.  Constitutional:      General: He is not in acute distress.    Appearance: He is not toxic-appearing.  Cardiovascular:     Rate and Rhythm: Normal rate.     Heart sounds: Murmur heard.  Pulmonary:     Effort: Pulmonary  effort is normal.     Breath sounds: Normal breath sounds. No wheezing.  Musculoskeletal:     Right lower leg: No edema.     Left lower leg: No edema.  Neurological:     General: No focal deficit present.     Mental Status: He is alert and oriented to person, place, and time.     Cranial Nerves: No cranial nerve deficit.     Sensory: No sensory deficit.     Coordination: Coordination normal.     Gait: Gait abnormal.     Deep Tendon Reflexes: Reflexes normal.           Assessment & Plan:  Coronary artery disease involving native coronary artery of native heart without angina pectoris  Essential hypertension  Cerebrovascular accident (CVA), unspecified mechanism ([place])  Controlled type 2 diabetes mellitus without complication, without long-term current use of insulin ([place]) - Plan: Hemoglobin A1c, CBC with Differential/Platelet, COMPLETE METABOLIC PANEL WITH GFR, Lipid panel, Protein / Creatinine Ratio, Urine  Pure hypercholesterolemia  Encounter for Medicare annual wellness exam  Acute pain of left shoulder Patient deals with chronic pain in his left shoulder.  He would like another cortisone injection as this gives him relief for approximately 2 months.  Using sterile technique, I injected the left subacromial space with 2 cc of lidocaine, 2 cc of Marcaine, and 2 cc of 40 mg/mL Kenalog.  The patient tolerated the procedure well without complication.  I recommended the shingles vaccine as well as an RSV vaccine.  I recommended against prostate cancer screening colon cancer screening.  We discussed AAA surveillance, carotid artery surveillance, and moderate aortic stenosis surveillance.  I recommended against all of these given his frail health.  He can certainly discuss this with his son but not advised to be to focus on quality of life and avoid any risky and aggressive interventions.  I will check a CBC a CMP a lipid panel as well as an A1c.  I will be happy with an A1c in the sevens  and an LDL cholesterol less than 55.  Monitor renal function.

## 2022-11-16 LAB — LIPID PANEL
Cholesterol: 120 mg/dL (ref <200)
HDL: 53 mg/dL (ref >=40)
LDL Cholesterol (Calc): 46 mg/dL (calc)
Non-HDL Cholesterol (Calc): 67 mg/dL (calc) (ref <130)
Total CHOL/HDL Ratio: 2.3 (calc) (ref <5.0)
Triglycerides: 128 mg/dL (ref <150)

## 2022-11-16 LAB — CBC WITH DIFFERENTIAL/PLATELET
Absolute Monocytes: 1056 cells/uL — ABNORMAL HIGH (ref 200–950)
Basophils Absolute: 71 cells/uL (ref 0–200)
Basophils Relative: 1.2 %
Eosinophils Absolute: 148 cells/uL (ref 15–500)
Eosinophils Relative: 2.5 %
HCT: 39.5 % (ref 38.5–50.0)
Hemoglobin: 13.2 g/dL (ref 13.2–17.1)
Lymphs Abs: 1528 cells/uL (ref 850–3900)
MCH: 32.1 pg (ref 27.0–33.0)
MCHC: 33.4 g/dL (ref 32.0–36.0)
MCV: 96.1 fL (ref 80.0–100.0)
MPV: 10.3 fL (ref 7.5–12.5)
Monocytes Relative: 17.9 %
Neutro Abs: 3098 cells/uL (ref 1500–7800)
Neutrophils Relative %: 52.5 %
Platelets: 240 10*3/uL (ref 140–400)
RBC: 4.11 10*6/uL — ABNORMAL LOW (ref 4.20–5.80)
RDW: 13.2 % (ref 11.0–15.0)
Total Lymphocyte: 25.9 %
WBC: 5.9 10*3/uL (ref 3.8–10.8)

## 2022-11-16 LAB — COMPLETE METABOLIC PANEL WITH GFR
AG Ratio: 1.5 (calc) (ref 1.0–2.5)
ALT: 14 U/L (ref 9–46)
AST: 17 U/L (ref 10–35)
Albumin: 3.8 g/dL (ref 3.6–5.1)
Alkaline phosphatase (APISO): 39 U/L (ref 35–144)
BUN/Creatinine Ratio: 21 (calc) (ref 6–22)
BUN: 29 mg/dL — ABNORMAL HIGH (ref 7–25)
CO2: 29 mmol/L (ref 20–32)
Calcium: 9.3 mg/dL (ref 8.6–10.3)
Chloride: 105 mmol/L (ref 98–110)
Creat: 1.4 mg/dL — ABNORMAL HIGH (ref 0.70–1.22)
Globulin: 2.5 g/dL (calc) (ref 1.9–3.7)
Glucose, Bld: 171 mg/dL — ABNORMAL HIGH (ref 65–99)
Potassium: 4.8 mmol/L (ref 3.5–5.3)
Sodium: 142 mmol/L (ref 135–146)
Total Bilirubin: 0.2 mg/dL (ref 0.2–1.2)
Total Protein: 6.3 g/dL (ref 6.1–8.1)
eGFR: 49 mL/min/{1.73_m2} — ABNORMAL LOW (ref >=60)

## 2022-11-16 LAB — HEMOGLOBIN A1C
Hgb A1c MFr Bld: 7.2 % of total Hgb — ABNORMAL HIGH (ref <5.7)
Mean Plasma Glucose: 160 mg/dL
eAG (mmol/L): 8.9 mmol/L

## 2022-11-18 NOTE — Addendum Note (Signed)
Addended by: Jenna Luo T on: 11/18/2022 02:02 PM   Modules accepted: Level of Service

## 2022-11-26 ENCOUNTER — Other Ambulatory Visit: Payer: Self-pay

## 2022-11-26 ENCOUNTER — Telehealth: Payer: Self-pay

## 2022-11-26 MED ORDER — GLIPIZIDE ER 10 MG PO TB24: 10.0000 mg | ORAL_TABLET | Freq: Every day | ORAL | 30 refills | Status: DC

## 2022-11-26 MED ORDER — GLIPIZIDE 10 MG PO TABS: 10.0000 mg | ORAL_TABLET | Freq: Every day | ORAL | 30 refills | Status: DC

## 2022-11-26 NOTE — Telephone Encounter (Signed)
Spoke w/pt's son this afternoon, re: pt's b/s. Told pt's son, per Dr. Dennard Schaumann," Increase glipizide XR to 10 mg poqday " (also per Dr. Dennard Schaumann, if can't find glipizide XR, try Glipizide XL.) Rx sent to pharmacy.   Per pt's son, voiced understanding and not further.

## 2022-11-26 NOTE — Progress Notes (Signed)
Error entry

## 2022-11-26 NOTE — Telephone Encounter (Signed)
Pt's son, [name] called this morning stating that pt has had a hard time regulating his blood sugars for the last 3 to 4 days now. Per pt told son, it has been running anywhere 175 to 280  For exp:  yester around 3pm pt checked @ 280/again at 47 when son got home from work @200$ -250 noted that pt has not eaten any from 1130am to 630pm.  This morning between 7pm last night to 9am @ 175  Checked at 12:00pm this afternoon @ 255  Please advice?

## 2022-11-29 ENCOUNTER — Other Ambulatory Visit: Payer: Self-pay | Admitting: Family Medicine

## 2022-11-29 DIAGNOSIS — I2510 Atherosclerotic heart disease of native coronary artery without angina pectoris: Secondary | ICD-10-CM

## 2022-12-16 ENCOUNTER — Other Ambulatory Visit: Payer: Self-pay | Admitting: Family Medicine

## 2022-12-16 DIAGNOSIS — E119 Type 2 diabetes mellitus without complications: Secondary | ICD-10-CM

## 2022-12-17 ENCOUNTER — Other Ambulatory Visit: Payer: Self-pay | Admitting: Family Medicine

## 2022-12-25 ENCOUNTER — Other Ambulatory Visit: Payer: Self-pay | Admitting: Family Medicine

## 2022-12-25 DIAGNOSIS — I350 Nonrheumatic aortic (valve) stenosis: Secondary | ICD-10-CM

## 2022-12-25 DIAGNOSIS — I639 Cerebral infarction, unspecified: Secondary | ICD-10-CM

## 2023-01-06 ENCOUNTER — Ambulatory Visit (INDEPENDENT_AMBULATORY_CARE_PROVIDER_SITE_OTHER): Payer: Medicare Other

## 2023-01-06 VITALS — Ht 71.0 in | Wt 167.0 lb

## 2023-01-06 DIAGNOSIS — Z0000 Encounter for general adult medical examination without abnormal findings: Secondary | ICD-10-CM

## 2023-01-06 NOTE — Progress Notes (Signed)
Subjective:   Barry [name] is a 89 y.o. male who presents for Medicare Annual/Subsequent preventive examination.  I connected with  [facility] on 01/06/23 by a audio enabled telemedicine application and verified that I am speaking with the correct person using two identifiers.  Patient Location: Home  Provider Location: Office/Clinic  I discussed the limitations of evaluation and management by telemedicine. The patient expressed understanding and agreed to proceed.  Review of Systems     Cardiac Risk Factors include: advanced age (>75men, >56 women);diabetes mellitus;dyslipidemia;male gender;hypertension;sedentary lifestyle     Objective:    Today's Vitals   01/06/23 1421  Weight: 167 lb (75.8 kg)  Height: 5\' 11"  (1.803 m)   Body mass index is 23.29 kg/m.     01/06/2023    2:24 PM 09/23/2022   10:19 AM 06/07/2022   10:51 AM 06/07/2022   10:27 AM 12/25/2021    2:16 PM 10/23/2021    1:54 PM 10/05/2021    5:57 PM  Advanced Directives  Does Patient Have a Medical Advance Directive? Yes No No No No No No  Type of Advance Directive [place]        Does patient want to make changes to medical advance directive? No - Patient declined        Copy of [place] in Chart? No - copy requested        Would patient like information on creating a medical advance directive?   No - Patient declined No - Patient declined No - Patient declined Yes (MAU/Ambulatory/Procedural Areas - Information given)     Current Medications (verified)   Allergies (verified) Lisinopril-hydrochlorothiazide and Doxycycline   History: Past Medical History:  Diagnosis Date   AAA (abdominal aortic aneurysm) (HCC) 12/17/2007   4.7 cm   Allergy    rhinitis   Anxiety    Cancer ([place])    skin   Cataract    Depression    Diabetes mellitus    Diverticulosis    GERD (gastroesophageal reflux disease)    Hepatitis    Hx of colonic polyps 08/26/2015    Hyperlipidemia    Hypertension    Nephrolithiasis    Personal history of colonic polyps    adenomas, serrated also   PVD (peripheral vascular disease) ([place])    Stroke ([place])    Tobacco abuse    Past Surgical History:  Procedure Laterality Date   COLONOSCOPY  2006, 2008, 2010, 05/27/2011   numerous adenomas - 13 in 2006, 3, 2008, 2 2012 (up to 1 cm), 4 diminutive adenomas, serrated adenomas 2012. Diverticulosis and hemorrhoids also.   EYE SURGERY     EYE SURGERY Left    Kidney stone operation     lower limb amputation, other toe 5th     percutaneous stent graft repair of infrarenal AAA  11/10   5.6 cm (T. early)   surgical excision basal cell carcinoma     TEE WITHOUT CARDIOVERSION N/A 07/29/2021   Procedure: TRANSESOPHAGEAL ECHOCARDIOGRAM (TEE);  Surgeon: Geralynn Rile, MD;  Location: [facility];  Service: Cardiovascular;  Laterality: N/A;   tympanic eardrum repair     VASECTOMY     Family History  Problem Relation Age of Onset   Emphysema Father    Cancer Brother        Lung   Depression Brother    Depression Sister    Lung disease Sister        Fibrosis and died  from pneumonia on 03/22/2013   Kidney disease Sister    Diabetes Sister    Learning disabilities Sister    Mental retardation Sister    Heart disease Mother    Cancer Brother    Colon cancer Neg Hx    Dementia Neg Hx    Alcohol abuse Neg Hx    Drug abuse Neg Hx    Paranoid behavior Neg Hx    Schizophrenia Neg Hx    Anxiety disorder Neg Hx    Bipolar disorder Neg Hx    OCD Neg Hx    Sexual abuse Neg Hx    Physical abuse Neg Hx    Seizures Neg Hx    Esophageal cancer Neg Hx    Stomach cancer Neg Hx    Rectal cancer Neg Hx    Social History   Socioeconomic History   Marital status: Widowed    Spouse name: Not on file   Number of children: 2   Years of education: Not on file   Highest education level: Not on file  Occupational History    Employer: RETIRED  Tobacco Use   Smoking status: Former     Packs/day: 1.00    Years: 60.00    Additional pack years: 0.00    Total pack years: 60.00    Types: Cigarettes   Smokeless tobacco: Never   Tobacco comments:    Hasn't smoked since he had his stroke   Vaping Use   Vaping Use: Never used  Substance and Sexual Activity   Alcohol use: No   Drug use: No   Sexual activity: Yes    Partners: Female    Birth control/protection: Surgical    Comment: vasectomy  Other Topics Concern   Not on file  Social History Narrative   11/11/21 living with son, Barry [name]   HSG, 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 retired 67 years - keeping busy. Lives alone. 1- step-daughter, 1 son - '68; 3 grandchildren. 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  (01/06/2023)   Overall Financial Resource Strain (CARDIA)    Difficulty of Paying Living Expenses: Not hard at all  Food Insecurity: No Food Insecurity (01/06/2023)   Hunger Vital Sign    Worried About Running Out of Food in the Last Year: Never true    [place] in the Last Year: Never true  Transportation Needs: No Transportation Needs (01/06/2023)   PRAPARE - Hydrologist (Medical): No    Lack of Transportation (Non-Medical): No  Physical Activity: Inactive (01/06/2023)   Exercise Vital Sign    Days of Exercise per Week: 0 days    Minutes of Exercise per Session: 0 min  Stress: No Stress Concern Present (01/06/2023)   [place]    Feeling of Stress : Only a little  Social Connections: Moderately Isolated (01/06/2023)   Social Connection and Isolation Panel [NHANES]    Frequency of Communication with Friends and Family: More than three times a week    Frequency of Social Gatherings with Friends and Family: More than three times a week    Attends Religious Services: 1 to 4 times per year    Active Member of [employer] or Organizations: No    Attends English as a second language teacher Meetings: Never    Marital Status: Widowed    Tobacco Counseling Counseling given: Not Answered Tobacco comments: Hasn't smoked since  he had his stroke    Clinical Intake:  Pre-visit preparation completed: Yes  Pain : No/denies pain  Diabetes: Yes CBG done?: No Did pt. bring in CBG monitor from home?: No  How often do you need to have someone help you when you read instructions, pamphlets, or other written materials from your doctor or pharmacy?: 1 - Never  Diabetic?Yes   Nutrition Risk Assessment:  Has the patient had any N/V/D within the last 2 months?  No  Does the patient have any non-healing wounds?  No  Has the patient had any unintentional weight loss or weight gain?  No   Diabetes:  Is the patient diabetic?  Yes  If diabetic, was a CBG obtained today?  No  Did the patient bring in their glucometer from home?  No  How often do you monitor your CBG's? Daily.   Financial Strains and Diabetes Management:  Are you having any financial strains with the device, your supplies or your medication? No .  Does the patient want to be seen by Chronic Care Management for management of their diabetes?  No  Would the patient like to be referred to a Nutritionist or for Diabetic Management?  No   Diabetic Exams:  Diabetic Eye Exam: Overdue for diabetic eye exam. Pt has been advised about the importance in completing this exam. Patient advised to call and schedule an eye exam. Diabetic Foot Exam: Overdue, Pt has been advised about the importance in completing this exam. Pt is scheduled for diabetic foot exam on at next office visit.   Interpreter Needed?: No  Comments: Assisted with visit by son -Darryl Information entered by :: Denman George LPN   Activities of Daily Living    01/06/2023    2:24 PM  In your present state of health, do you have any difficulty performing the following activities:  Hearing? 1  Vision? 0  Difficulty concentrating or  making decisions? 0  Walking or climbing stairs? 1  Dressing or bathing? 0  Doing errands, shopping? 1  Preparing Food and eating ? N  Using the Toilet? N  In the past six months, have you accidently leaked urine? N  Do you have problems with loss of bowel control? N  Managing your Medications? Y  Managing your Finances? Y  Housekeeping or managing your Housekeeping? Y    Patient Care Team: Susy Frizzle, MD as PCP - General (Family Medicine) Early, Arvilla Meres, MD (Vascular Surgery) Sherlynn Stalls, MD (Ophthalmology) Allyn Kenner, MD (Dermatology) Josue Hector, MD (Cardiology) Edythe Clarity, [facility] as Pharmacist (Pharmacist) Cloria Spring, MD as Consulting Physician [facility])  Indicate any recent Medical Services you may have received from other than [facility] providers in the past year (date may be approximate).     Assessment:   This is a routine wellness examination for [place].  Hearing/Vision screen Hearing Screening - Comments:: Hard of hearing  Vision Screening - Comments:: No vision problems; will schedule routine eye exam soon    Dietary issues and exercise activities discussed: Current Exercise Habits: The patient does not participate in regular exercise at present   Goals Addressed             This Visit's Progress    COMPLETED: Pharmacy Care Plan:       [place] (see longitudinal plan of care for additional care plan information)  Current Barriers:  Chronic Disease Management support, education, and care coordination needs related to Hypertension, Hyperlipidemia, Diabetes, and  Depression   Hypertension BP Readings from Last 3 Encounters:  03/20/20 (!) 162/88  03/18/20 (!) 158/88  02/22/20 (!) 148/78  Pharmacist Clinical Goal(s): Over the next 180 days, patient will work with PharmD and providers to achieve BP goal <130/80 Current regimen:  HCTZ 25mg  Metoprolol XL 25mg  Nifedipine XL 30mg  Ramipril 10mg  Interventions: Reviewed  home monitoring procedures Discussed medication adherence Patient self care activities - Over the next 180 days, patient will: Check BP periodically, document, and provide at future appointments Ensure daily salt intake < 2300 mg/day  Hyperlipidemia Lab Results  Component Value Date/Time   LDLCALC 60 02/21/2020 08:13 AM   LDLDIRECT 152.6 05/28/2008 01:33 PM  Pharmacist Clinical Goal(s): Over the next 180 days, patient will work with PharmD and providers to maintain LDL goal < 70 Current regimen:  Ezetimibe 10mg  Simvastatin 40mg  Interventions: Reviewed most recent lipid panel Discussed importance of statin medications Patient self care activities - Over the next 180 days, patient will: Continue to focus on medication adherence by pill box  Diabetes Lab Results  Component Value Date/Time   HGBA1C 6.4 (H) 02/21/2020 08:13 AM   HGBA1C 6.7 (H) 04/27/2019 08:24 AM  Pharmacist Clinical Goal(s): Over the next 180 days, patient will work with PharmD and providers to maintain A1c goal <7% Current regimen:  Glimepiride 1mg  Interventions: Reviewed home blood sugar readings Reviewed most recent A1c Discussed importance of eating regular meals to avoid hypoglycemia Patient self care activities - Over the next 180 days, patient will: Check blood sugar once or twice daily, document, and provide at future appointments Contact provider with any episodes of hypoglycemia Continue to eat diet low in carbohydrates and sugars  Depression Pharmacist Clinical Goal(s) Over the next 180 days, patient will work with PharmD and providers to optimize medication and minimize symptoms of depression. Current regimen:  Fluoxetine 20mg  Clonazepam 0.5mg  one-half tablet twice daily as needed Interventions: Discussed frequency of dizziness Reviewed current depression symptoms. Patient self care activities - Over the next 180 days, patient will: Continue decreased dose of clonazepam Contact providers  with any sudden change in symptoms   Initial goal documentation      COMPLETED: Track and Manage My Blood Pressure-Hypertension       Timeframe:  Long-Range Goal Priority:  High Start Date:   01/01/21                          Expected End Date:   12/31/21                  Follow Up Date 12/31/21   - check blood pressure 3 times per week - write blood pressure results in a log or diary    Why is this important?   You won't feel high blood pressure, but it can still hurt your blood vessels.  High blood pressure can cause heart or kidney problems. It can also cause a stroke.  Making lifestyle changes like losing a little weight or eating less salt will help.  Checking your blood pressure at home and at different times of the day can help to control blood pressure.  If the doctor prescribes medicine remember to take it the way the doctor ordered.  Call the office if you cannot afford the medicine or if there are questions about it.     Notes:       Depression Screen    01/06/2023    2:23 PM 12/25/2021    2:11 PM  12/15/2021    1:53 PM 10/23/2021    2:00 PM 10/23/2021    1:58 PM 02/13/2021   10:53 AM 11/10/2020    9:54 AM  PHQ 2/9 Scores  PHQ - 2 Score 0 2  3 4  0 0  PHQ- 9 Score  2  6  2 3      Information is confidential and restricted. Go to Review Flowsheets to unlock data.    Fall Risk    01/06/2023    2:22 PM 12/25/2021    2:17 PM 11/11/2021    3:06 PM 10/23/2021    1:45 PM 02/13/2021   10:53 AM  Fall Risk   Falls in the past year? 0 1 1 1  0  Number falls in past yr: 0 1 1 1    Injury with Fall? 0 0 0 0   Risk for fall due to : Impaired balance/gait;Impaired mobility History of fall(s);Impaired balance/gait;Impaired mobility   Impaired mobility;Impaired balance/gait  Follow up Falls prevention discussed;Education provided;Falls evaluation completed Falls prevention discussed   Falls evaluation completed    FALL RISK PREVENTION PERTAINING TO THE HOME:  Any stairs in or  around the home? No  If so, are there any without handrails? No  Home free of loose throw rugs in walkways, pet beds, electrical cords, etc? Yes  Adequate lighting in your home to reduce risk of falls? Yes   ASSISTIVE DEVICES UTILIZED TO PREVENT FALLS:  Life alert? No  Use of a cane, walker or w/c? Yes  Grab bars in the bathroom? Yes  Shower chair or bench in shower? No  Elevated toilet seat or a handicapped toilet? Yes   TIMED UP AND GO:  Was the test performed? No . Telephonic visit   Cognitive Function:        01/06/2023    2:24 PM  6CIT Screen  What Year? 0 points  What month? 0 points  What time? 0 points  Count back from 20 0 points  Months in reverse 2 points  Repeat phrase 2 points  Total Score 4 points    Immunizations Immunization History  Administered Date(s) Administered   Fluad Quad(high Dose 65+) 07/12/2019, 12/15/2020, 08/12/2021   H1N1 10/15/2008   Influenza Split 07/12/2011, 08/18/2012   Influenza Whole 09/25/2007, 08/23/2008, 08/25/2010   Influenza, High Dose Seasonal PF 08/01/2013, 08/16/2014, 09/05/2018   Influenza-Unspecified 08/05/2015, 08/28/2016, 09/02/2017   Moderna Sars-Covid-2 Vaccination 11/19/2019, 12/17/2019, 11/18/2020   Pneumococcal Conjugate-13 02/17/2015   Td 06/06/2012    TDAP status: Due, Education has been provided regarding the importance of this vaccine. Advised may receive this vaccine at local pharmacy or Health Dept. Aware to provide a copy of the vaccination record if obtained from local pharmacy or Health Dept. Verbalized acceptance and understanding.  Flu Vaccine status: Up to date  Pneumococcal vaccine status: Up to date  Covid-19 vaccine status: Completed vaccines  Qualifies for Shingles Vaccine? Yes   Zostavax completed No   Shingrix Completed?: No.    Education has been provided regarding the importance of this vaccine. Patient has been advised to call insurance company to determine out of pocket expense if they  have not yet received this vaccine. Advised may also receive vaccine at local pharmacy or Health Dept. Verbalized acceptance and understanding.  Screening Tests Health Maintenance  Topic Date Due   Zoster Vaccines- Shingrix (1 of 2) Never done   Pneumonia Vaccine 37+ Years old (2 of 2 - PPSV23 or PCV20) 04/14/2015   FOOT EXAM  12/15/2021  OPHTHALMOLOGY EXAM  03/03/2022   INFLUENZA VACCINE  05/18/2022   DTaP/Tdap/Td (2 - Tdap) 06/06/2022   COVID-19 Vaccine (4 - 2023-24 season) 06/18/2022   HEMOGLOBIN A1C  05/16/2023   Medicare Annual Wellness (AWV)  01/06/2024   HPV VACCINES  Aged Out   COLONOSCOPY (Pts 45-36yrs Insurance coverage will need to be confirmed)  Discontinued    Health Maintenance  Health Maintenance Due  Topic Date Due   Zoster Vaccines- Shingrix (1 of 2) Never done   Pneumonia Vaccine 34+ Years old (2 of 2 - PPSV23 or PCV20) 04/14/2015   FOOT EXAM  12/15/2021   OPHTHALMOLOGY EXAM  03/03/2022   INFLUENZA VACCINE  05/18/2022   DTaP/Tdap/Td (2 - Tdap) 06/06/2022   COVID-19 Vaccine (4 - 2023-24 season) 06/18/2022    Colorectal cancer screening: No longer required.   Lung Cancer Screening: (Low Dose CT Chest recommended if Age 66-80 years, 30 pack-year currently smoking OR have quit w/in 15years.) does not qualify.   Lung Cancer Screening Referral: n/a  Additional Screening:  Hepatitis C Screening: does not qualify;  Vision Screening: Recommended annual ophthalmology exams for early detection of glaucoma and other disorders of the eye. Is the patient up to date with their annual eye exam?  No  Who is the provider or what is the name of the office in which the patient attends annual eye exams? None If pt is not established with a provider, would they like to be referred to a provider to establish care? No .   Dental Screening: Recommended annual dental exams for proper oral hygiene  Community Resource Referral / Chronic Care Management: CRR required this  visit?  No   CCM required this visit?  No      Plan:     I have personally reviewed and noted the following in the patient's chart:   Medical and social history Use of alcohol, tobacco or illicit drugs  Current medications and supplements including opioid prescriptions. Patient is not currently taking opioid prescriptions. Functional ability and status Nutritional status Physical activity Advanced directives List of other physicians Hospitalizations, surgeries, and ER visits in previous 12 months Vitals Screenings to include cognitive, depression, and falls Referrals and appointments  In addition, I have reviewed and discussed with patient certain preventive protocols, quality metrics, and best practice recommendations. A written personalized care plan for preventive services as well as general preventive health recommendations were provided to patient.     Vanetta Mulders, [place]   [id number]   Due to this being a virtual visit, the after visit summary with patients personalized plan was offered to patient via mail or my-chart. Patient would like to access on my-chart  Nurse Notes: No concerns

## 2023-01-06 NOTE — Patient Instructions (Signed)
Barry [name] , Thank you for taking time to come for your Medicare Wellness Visit. I appreciate your ongoing commitment to your health goals. Please review the following plan we discussed and let me know if I can assist you in the future.   These are the goals we discussed:  Goals      Exercise 3x per week (30 min per time)     Continue PT to strengthen legs.         This is a list of the screening recommended for you and due dates:  Health Maintenance  Topic Date Due   Zoster (Shingles) Vaccine (1 of 2) Never done   Pneumonia Vaccine (2 of 2 - PPSV23 or PCV20) 04/14/2015   Complete foot exam   12/15/2021   Eye exam for diabetics  03/03/2022   Flu Shot  05/18/2022   DTaP/Tdap/Td vaccine (2 - Tdap) 06/06/2022   COVID-19 Vaccine (4 - 2023-24 season) 06/18/2022   Hemoglobin A1C  05/16/2023   Medicare Annual Wellness Visit  01/06/2024   HPV Vaccine  Aged Out   Colon Cancer Screening  Discontinued    Advanced directives: Please bring a copy of your health care power of attorney and living will to the office to be added to your chart at your convenience.   Conditions/risks identified: Aim for 30 minutes of exercise or brisk walking, 6-8 glasses of water, and 5 servings of fruits and vegetables each day.   Next appointment: Follow up in one year for your annual wellness visit.   Preventive Care 31 Years and Older, Male  Preventive care refers to lifestyle choices and visits with your health care provider that can promote health and wellness. What does preventive care include? A yearly physical exam. This is also called an annual well check. Dental exams once or twice a year. Routine eye exams. Ask your health care provider how often you should have your eyes checked. Personal lifestyle choices, including: Daily care of your teeth and gums. Regular physical activity. Eating a healthy diet. Avoiding tobacco and drug use. Limiting alcohol use. Practicing safe sex. Taking low  doses of aspirin every day. Taking vitamin and mineral supplements as recommended by your health care provider. What happens during an annual well check? The services and screenings done by your health care provider during your annual well check will depend on your age, overall health, lifestyle risk factors, and family history of disease. Counseling  Your health care provider may ask you questions about your: Alcohol use. Tobacco use. Drug use. Emotional well-being. Home and relationship well-being. Sexual activity. Eating habits. History of falls. Memory and ability to understand (cognition). Work and work Statistician. Screening  You may have the following tests or measurements: Height, weight, and BMI. Blood pressure. Lipid and cholesterol levels. These may be checked every 5 years, or more frequently if you are over 53 years old. Skin check. Lung cancer screening. You may have this screening every year starting at age 2 if you have a 30-pack-year history of smoking and currently smoke or have quit within the past 15 years. Fecal occult blood test (FOBT) of the stool. You may have this test every year starting at age 25. Flexible sigmoidoscopy or colonoscopy. You may have a sigmoidoscopy every 5 years or a colonoscopy every 10 years starting at age 38. Prostate cancer screening. Recommendations will vary depending on your family history and other risks. Hepatitis C blood test. Hepatitis B blood test. Sexually transmitted disease (STD) testing. Diabetes  screening. This is done by checking your blood sugar (glucose) after you have not eaten for a while (fasting). You may have this done every 1-3 years. Abdominal aortic aneurysm (AAA) screening. You may need this if you are a current or former smoker. Osteoporosis. You may be screened starting at age 40 if you are at high risk. Talk with your health care provider about your test results, treatment options, and if necessary, the need  for more tests. Vaccines  Your health care provider may recommend certain vaccines, such as: Influenza vaccine. This is recommended every year. Tetanus, diphtheria, and acellular pertussis (Tdap, Td) vaccine. You may need a Td booster every 10 years. Zoster vaccine. You may need this after age 79. Pneumococcal 13-valent conjugate (PCV13) vaccine. One dose is recommended after age 77. Pneumococcal polysaccharide (PPSV23) vaccine. One dose is recommended after age 70. Talk to your health care provider about which screenings and vaccines you need and how often you need them. This information is not intended to replace advice given to you by your health care provider. Make sure you discuss any questions you have with your health care provider. Document Released: 10/31/2015 Document Revised: 06/23/2016 Document Reviewed: 08/05/2015 Elsevier Interactive Patient Education  2017 [place] Prevention in the Home Falls can cause injuries. They can happen to people of all ages. There are many things you can do to make your home safe and to help prevent falls. What can I do on the outside of my home? Regularly fix the edges of walkways and driveways and fix any cracks. Remove anything that might make you trip as you walk through a door, such as a raised step or threshold. Trim any bushes or trees on the path to your home. Use bright outdoor lighting. Clear any walking paths of anything that might make someone trip, such as rocks or tools. Regularly check to see if handrails are loose or broken. Make sure that both sides of any steps have handrails. Any raised decks and porches should have guardrails on the edges. Have any leaves, snow, or ice cleared regularly. Use sand or salt on walking paths during winter. Clean up any spills in your garage right away. This includes oil or grease spills. What can I do in the bathroom? Use night lights. Install grab bars by the toilet and in the tub and  shower. Do not use towel bars as grab bars. Use non-skid mats or decals in the tub or shower. If you need to sit down in the shower, use a plastic, non-slip stool. Keep the floor dry. Clean up any water that spills on the floor as soon as it happens. Remove soap buildup in the tub or shower regularly. Attach bath mats securely with double-sided non-slip rug tape. Do not have throw rugs and other things on the floor that can make you trip. What can I do in the bedroom? Use night lights. Make sure that you have a light by your bed that is easy to reach. Do not use any sheets or blankets that are too big for your bed. They should not hang down onto the floor. Have a firm chair that has side arms. You can use this for support while you get dressed. Do not have throw rugs and other things on the floor that can make you trip. What can I do in the kitchen? Clean up any spills right away. Avoid walking on wet floors. Keep items that you use a lot in easy-to-reach places.  If you need to reach something above you, use a strong step stool that has a grab bar. Keep electrical cords out of the way. Do not use floor polish or wax that makes floors slippery. If you must use wax, use non-skid floor wax. Do not have throw rugs and other things on the floor that can make you trip. What can I do with my stairs? Do not leave any items on the stairs. Make sure that there are handrails on both sides of the stairs and use them. Fix handrails that are broken or loose. Make sure that handrails are as long as the stairways. Check any carpeting to make sure that it is firmly attached to the stairs. Fix any carpet that is loose or worn. Avoid having throw rugs at the top or bottom of the stairs. If you do have throw rugs, attach them to the floor with carpet tape. Make sure that you have a light switch at the top of the stairs and the bottom of the stairs. If you do not have them, ask someone to add them for  you. What else can I do to help prevent falls? Wear shoes that: Do not have high heels. Have rubber bottoms. Are comfortable and fit you well. Are closed at the toe. Do not wear sandals. If you use a stepladder: Make sure that it is fully opened. Do not climb a closed stepladder. Make sure that both sides of the stepladder are locked into place. Ask someone to hold it for you, if possible. Clearly mark and make sure that you can see: Any grab bars or handrails. First and last steps. Where the edge of each step is. Use tools that help you move around (mobility aids) if they are needed. These include: Canes. Walkers. Scooters. Crutches. Turn on the lights when you go into a dark area. Replace any light bulbs as soon as they burn out. Set up your furniture so you have a clear path. Avoid moving your furniture around. If any of your floors are uneven, fix them. If there are any pets around you, be aware of where they are. Review your medicines with your doctor. Some medicines can make you feel dizzy. This can increase your chance of falling. Ask your doctor what other things that you can do to help prevent falls. This information is not intended to replace advice given to you by your health care provider. Make sure you discuss any questions you have with your health care provider. Document Released: 07/31/2009 Document Revised: 03/11/2016 Document Reviewed: 11/08/2014 Elsevier Interactive Patient Education  2017 [employer].

## 2023-01-13 ENCOUNTER — Telehealth: Payer: Medicare Other

## 2023-01-13 ENCOUNTER — Other Ambulatory Visit: Payer: Self-pay | Admitting: Family Medicine

## 2023-01-15 ENCOUNTER — Other Ambulatory Visit: Payer: Self-pay | Admitting: Family Medicine

## 2023-01-15 DIAGNOSIS — E119 Type 2 diabetes mellitus without complications: Secondary | ICD-10-CM

## 2023-01-17 NOTE — Telephone Encounter (Signed)
Requested Prescriptions  Pending Prescriptions Disp Refills   JANUVIA 100 MG tablet [Pharmacy Med Name: Januvia 100 MG Oral Tablet] 90 tablet 0    Sig: Take 1 tablet by mouth once daily     Endocrinology:  Diabetes - DPP-4 Inhibitors Failed - 01/15/2023  9:53 AM      Failed - Cr in normal range and within 360 days    Creat  Date Value Ref Range Status  11/15/2022 1.40 (H) 0.70 - 1.22 mg/dL Final   Creatinine, Urine  Date Value Ref Range Status  03/19/2021 116 20 - 320 mg/dL Final         Failed - Valid encounter within last 6 months    Recent Outpatient Visits           10 months ago Type 2 diabetes mellitus with hyperglycemia, without long-term current use of insulin ([place])   [facility] Pickard, Cammie Mcgee, MD   1 year ago [place] Susy Frizzle, MD   1 year ago Essential hypertension   [place] Dennard Schaumann, Cammie Mcgee, MD   1 year ago Mass of parotid gland   [place] Susy Frizzle, MD   1 year ago Dizziness and giddiness   [place] Pickard, Cammie Mcgee, MD              Passed - HBA1C is between 0 and 7.9 and within 180 days    Hgb A1c MFr Bld  Date Value Ref Range Status  11/15/2022 7.2 (H) <5.7 % of total Hgb Final    Comment:    For someone without known diabetes, a hemoglobin A1c value of 6.5% or greater indicates that they may have  diabetes and this should be confirmed with a follow-up  test. . For someone with known diabetes, a value <7% indicates  that their diabetes is well controlled and a value  greater than or equal to 7% indicates suboptimal  control. A1c targets should be individualized based on  duration of diabetes, age, comorbid conditions, and  other considerations. . Currently, no consensus exists regarding use of hemoglobin A1c for diagnosis of diabetes for children. Marland Kitchen

## 2023-02-24 ENCOUNTER — Other Ambulatory Visit: Payer: Self-pay | Admitting: Family Medicine

## 2023-02-24 DIAGNOSIS — I2510 Atherosclerotic heart disease of native coronary artery without angina pectoris: Secondary | ICD-10-CM

## 2023-02-24 DIAGNOSIS — I639 Cerebral infarction, unspecified: Secondary | ICD-10-CM

## 2023-02-24 DIAGNOSIS — I350 Nonrheumatic aortic (valve) stenosis: Secondary | ICD-10-CM

## 2023-03-15 DIAGNOSIS — X32XXXD Exposure to sunlight, subsequent encounter: Secondary | ICD-10-CM | POA: Diagnosis not present

## 2023-03-15 DIAGNOSIS — C44319 Basal cell carcinoma of skin of other parts of face: Secondary | ICD-10-CM | POA: Diagnosis not present

## 2023-03-15 DIAGNOSIS — L570 Actinic keratosis: Secondary | ICD-10-CM | POA: Diagnosis not present

## 2023-03-15 DIAGNOSIS — Z85828 Personal history of other malignant neoplasm of skin: Secondary | ICD-10-CM | POA: Diagnosis not present

## 2023-03-15 DIAGNOSIS — Z08 Encounter for follow-up examination after completed treatment for malignant neoplasm: Secondary | ICD-10-CM | POA: Diagnosis not present

## 2023-03-16 ENCOUNTER — Other Ambulatory Visit: Payer: Self-pay | Admitting: Family Medicine

## 2023-03-17 ENCOUNTER — Other Ambulatory Visit: Payer: Self-pay | Admitting: Family Medicine

## 2023-03-18 NOTE — Telephone Encounter (Signed)
Patient will need an office visit for further refills. Requested Prescriptions  Pending Prescriptions Disp Refills   buPROPion (WELLBUTRIN XL) 150 MG 24 hr tablet [Pharmacy Med Name: BUPROPION HCL XL 150 MG TAB 150 Tablet] 90 tablet 0    Sig: TAKE 1 TABLET (150 MG TOTAL) BY MOUTH DAILY.     Psychiatry: Antidepressants - bupropion Failed - 03/17/2023 12:37 PM      Failed - Cr in normal range and within 360 days    Creat  Date Value Ref Range Status  11/15/2022 1.40 (H) 0.70 - 1.22 mg/dL Final   Creatinine, Urine  Date Value Ref Range Status  03/19/2021 116 20 - 320 mg/dL Final         Failed - Valid encounter within last 6 months    Recent Outpatient Visits           1 year ago Type 2 diabetes mellitus with hyperglycemia, without long-term current use of insulin (HCC)   [facility] Pickard, Priscille Heidelberg, MD   1 year ago Dysuria   [facility] Donita Brooks, MD   1 year ago Essential hypertension   [facility] Donita Brooks, MD   1 year ago Mass of parotid gland   [facility] Donita Brooks, MD   2 years ago Dizziness and giddiness   [facility] Pickard, Priscille Heidelberg, MD              Passed - AST in normal range and within 360 days    AST  Date Value Ref Range Status  11/15/2022 17 10 - 35 U/L Final         Passed - ALT in normal range and within 360 days    ALT  Date Value Ref Range Status  11/15/2022 14 9 - 46 U/L Final         Passed - Completed PHQ-2 or PHQ-9 in the last 360 days      Passed - Last BP in normal range    BP Readings from Last 1 Encounters:  11/15/22 122/60

## 2023-03-21 ENCOUNTER — Emergency Department (HOSPITAL_COMMUNITY): Payer: Medicare Other

## 2023-03-21 ENCOUNTER — Emergency Department (HOSPITAL_COMMUNITY)
Admission: EM | Admit: 2023-03-21 | Discharge: 2023-03-21 | Disposition: A | Discharge status: Home / Self Care | Payer: Medicare Other | Attending: Emergency Medicine | Admitting: Emergency Medicine

## 2023-03-21 ENCOUNTER — Other Ambulatory Visit: Payer: Self-pay

## 2023-03-21 ENCOUNTER — Encounter (HOSPITAL_COMMUNITY): Payer: Self-pay

## 2023-03-21 DIAGNOSIS — Z7902 Long term (current) use of antithrombotics/antiplatelets: Secondary | ICD-10-CM | POA: Diagnosis not present

## 2023-03-21 DIAGNOSIS — N3000 Acute cystitis without hematuria: Secondary | ICD-10-CM | POA: Insufficient documentation

## 2023-03-21 DIAGNOSIS — Z85828 Personal history of other malignant neoplasm of skin: Secondary | ICD-10-CM | POA: Insufficient documentation

## 2023-03-21 DIAGNOSIS — Z79899 Other long term (current) drug therapy: Secondary | ICD-10-CM | POA: Diagnosis not present

## 2023-03-21 DIAGNOSIS — R42 Dizziness and giddiness: Secondary | ICD-10-CM | POA: Diagnosis not present

## 2023-03-21 DIAGNOSIS — G4489 Other headache syndrome: Secondary | ICD-10-CM | POA: Diagnosis not present

## 2023-03-21 DIAGNOSIS — R739 Hyperglycemia, unspecified: Secondary | ICD-10-CM | POA: Diagnosis not present

## 2023-03-21 DIAGNOSIS — Z1152 Encounter for screening for COVID-19: Secondary | ICD-10-CM | POA: Insufficient documentation

## 2023-03-21 DIAGNOSIS — I10 Essential (primary) hypertension: Secondary | ICD-10-CM | POA: Insufficient documentation

## 2023-03-21 DIAGNOSIS — Z8673 Personal history of transient ischemic attack (TIA), and cerebral infarction without residual deficits: Secondary | ICD-10-CM | POA: Diagnosis not present

## 2023-03-21 DIAGNOSIS — I491 Atrial premature depolarization: Secondary | ICD-10-CM | POA: Diagnosis not present

## 2023-03-21 DIAGNOSIS — R410 Disorientation, unspecified: Secondary | ICD-10-CM | POA: Diagnosis not present

## 2023-03-21 DIAGNOSIS — R519 Headache, unspecified: Secondary | ICD-10-CM | POA: Diagnosis not present

## 2023-03-21 DIAGNOSIS — I6381 Other cerebral infarction due to occlusion or stenosis of small artery: Secondary | ICD-10-CM | POA: Diagnosis not present

## 2023-03-21 DIAGNOSIS — R509 Fever, unspecified: Secondary | ICD-10-CM | POA: Diagnosis not present

## 2023-03-21 LAB — URINALYSIS, W/ REFLEX TO CULTURE (INFECTION SUSPECTED)
Bilirubin Urine: NEGATIVE
Glucose, UA: NEGATIVE mg/dL
Hgb urine dipstick: NEGATIVE
Ketones, ur: NEGATIVE mg/dL
Nitrite: NEGATIVE
Protein, ur: 100 mg/dL — AB
Specific Gravity, Urine: 1.018 (ref 1.005–1.030)
WBC, UA: >50 WBC/hpf (ref 0–5)
pH: 5 (ref 5.0–8.0)

## 2023-03-21 LAB — COMPREHENSIVE METABOLIC PANEL
ALT: 21 U/L (ref 0–44)
AST: 24 U/L (ref 15–41)
Albumin: 3.1 g/dL — ABNORMAL LOW (ref 3.5–5.0)
Alkaline Phosphatase: 31 U/L — ABNORMAL LOW (ref 38–126)
Anion gap: 8 (ref 5–15)
BUN: 32 mg/dL — ABNORMAL HIGH (ref 8–23)
CO2: 23 mmol/L (ref 22–32)
Calcium: 8.3 mg/dL — ABNORMAL LOW (ref 8.9–10.3)
Chloride: 103 mmol/L (ref 98–111)
Creatinine, Ser: 1.28 mg/dL — ABNORMAL HIGH (ref 0.61–1.24)
GFR, Estimated: 54 mL/min — ABNORMAL LOW (ref >60)
Glucose, Bld: 211 mg/dL — ABNORMAL HIGH (ref 70–99)
Potassium: 3.7 mmol/L (ref 3.5–5.1)
Sodium: 134 mmol/L — ABNORMAL LOW (ref 135–145)
Total Bilirubin: 0.6 mg/dL (ref 0.3–1.2)
Total Protein: 6.2 g/dL — ABNORMAL LOW (ref 6.5–8.1)

## 2023-03-21 LAB — CBC WITH DIFFERENTIAL/PLATELET
Abs Immature Granulocytes: 0.08 10*3/uL — ABNORMAL HIGH (ref 0.00–0.07)
Basophils Absolute: 0 10*3/uL (ref 0.0–0.1)
Basophils Relative: 0 %
Eosinophils Absolute: 0 10*3/uL (ref 0.0–0.5)
Eosinophils Relative: 0 %
HCT: 35.4 % — ABNORMAL LOW (ref 39.0–52.0)
Hemoglobin: 11.3 g/dL — ABNORMAL LOW (ref 13.0–17.0)
Immature Granulocytes: 1 %
Lymphocytes Relative: 9 %
Lymphs Abs: 0.5 10*3/uL — ABNORMAL LOW (ref 0.7–4.0)
MCH: 30.5 pg (ref 26.0–34.0)
MCHC: 31.9 g/dL (ref 30.0–36.0)
MCV: 95.4 fL (ref 80.0–100.0)
Monocytes Absolute: 0.8 10*3/uL (ref 0.1–1.0)
Monocytes Relative: 14 %
Neutro Abs: 4.2 10*3/uL (ref 1.7–7.7)
Neutrophils Relative %: 76 %
Platelets: 175 10*3/uL (ref 150–400)
RBC: 3.71 MIL/uL — ABNORMAL LOW (ref 4.22–5.81)
RDW: 14.9 % (ref 11.5–15.5)
WBC: 5.6 10*3/uL (ref 4.0–10.5)
nRBC: 0 % (ref 0.0–0.2)

## 2023-03-21 LAB — LACTIC ACID, PLASMA: Lactic Acid, Venous: 1 mmol/L (ref 0.5–1.9)

## 2023-03-21 LAB — SARS CORONAVIRUS 2 BY RT PCR: SARS Coronavirus 2 by RT PCR: NEGATIVE

## 2023-03-21 MED ORDER — CEPHALEXIN 500 MG PO CAPS: 500.0000 mg | ORAL_CAPSULE | Freq: Three times a day (TID) | ORAL | 0 refills | Status: DC

## 2023-03-21 MED ORDER — CEPHALEXIN 500 MG PO CAPS
500.0000 mg | ORAL_CAPSULE | Freq: Once | ORAL | Status: AC
  Administered 2023-03-21: 500 mg via ORAL
  Filled 2023-03-21: qty 1

## 2023-03-21 NOTE — ED Notes (Signed)
Pt able to ambulate with a walker. Son reports that his ambulation was base line.

## 2023-03-21 NOTE — ED Provider Notes (Signed)
EMERGENCY DEPARTMENT AT [facility] Provider Note   CSN: [id number] Arrival date & time: 03/21/23  0354     History  Chief Complaint  Patient presents with   Dizziness   Fever    Barry [name] is a 89 y.o. male.  The history is provided by the patient and a relative.  Patient with extensive history including previous CVA with right-sided weakness, hypertension, skin cancer presents with generalized weakness and fever.  Patient lives with his son. Son reports that tonight patient woke up around 1 shivering reporting he was cold.  They checked his temperature and it was 103.5.  He was given Tylenol.  Patient reported dizziness and generalized weakness.  He did have an episode of vomiting has had mild headache.  No cough or shortness of breath.  No diarrhea. He did have a fall yesterday morning while going to the bathroom but does not recall having any traumatic injuries.  He did have some lesions removed from his left face and forehead recently, but no discharge or significant pain with those wounds   Past Medical History:  Diagnosis Date   AAA (abdominal aortic aneurysm) (HCC) 12/17/2007   4.7 cm   Allergy    rhinitis   Anxiety    Cancer (HCC)    skin   Cataract    Depression    Diabetes mellitus    Diverticulosis    GERD (gastroesophageal reflux disease)    Hepatitis    Hx of colonic polyps 08/26/2015   Hyperlipidemia    Hypertension    Nephrolithiasis    Personal history of colonic polyps    adenomas, serrated also   PVD (peripheral vascular disease) (HCC)    Stroke (HCC)    Tobacco abuse     Home Medications Prior to Admission medications   Medication Sig Start Date End Date Taking? Authorizing Provider  cephALEXin (KEFLEX) 500 MG capsule Take 1 capsule (500 mg total) by mouth 3 (three) times daily. 03/21/23  Yes Zadie Rhine, MD  acetaminophen (TYLENOL) 325 MG tablet Take 2 tablets (650 mg total) by mouth every 6 (six) hours as  needed for mild pain (or Fever >/= 101). 08/24/21   Angiulli, Mcarthur Rossetti, PA-C  amLODipine (NORVASC) 10 MG tablet Take 1 tablet (10 mg total) by mouth daily. 10/25/22   Donita Brooks, MD  artificial tears (LACRILUBE) OINT ophthalmic ointment Place into both eyes every 4 (four) hours as needed for dry eyes. 08/24/21   Angiulli, Mcarthur Rossetti, PA-C  buPROPion (WELLBUTRIN XL) 150 MG 24 hr tablet TAKE 1 TABLET (150 MG TOTAL) BY MOUTH DAILY. 03/18/23   Donita Brooks, MD  clonazePAM (KLONOPIN) 0.5 MG tablet TAKE 1/2 TABLET BY MOUTH 3 TIMES DAILY AS NEEDED FOR ANXIETY. 03/16/23   Donita Brooks, MD  clopidogrel (PLAVIX) 75 MG tablet TAKE 1 TABLET (75 MG TOTAL) BY MOUTH DAILY. NEED APPOINTMENT WITH PRIMARY CARE PROVIDER FOR FURTHER REFILLS 02/24/23   Donita Brooks, MD  ezetimibe (ZETIA) 10 MG tablet TAKE 1 TABLET BY MOUTH DAILY 02/24/23   Donita Brooks, MD  FLUoxetine (PROZAC) 20 MG capsule TAKE (1) CAPSULE BY MOUTH EACH MORNING. 10/08/22   Donita Brooks, MD  fluticasone (FLONASE) 50 MCG/ACT nasal spray Place 2 sprays into both nostrils daily. 10/02/21   Donita Brooks, MD  gabapentin (NEURONTIN) 300 MG capsule TAKE 1 CAPSULE BY MOUTH 2 TIMES DAILY AS NEEDED (NERVE PAIN). 01/13/23   Donita Brooks, MD  glipiZIDE (  GLIPIZIDE XL) 10 MG 24 hr tablet Take 1 tablet (10 mg total) by mouth daily with breakfast. 11/26/22   Donita Brooks, MD  hydrALAZINE (APRESOLINE) 100 MG tablet TAKE 1 TABLET BY MOUTH EVERY 8 HOURS. 03/16/23   Donita Brooks, MD  metoprolol succinate (TOPROL-XL) 25 MG 24 hr tablet TAKE 1/2 TABLET BY MOUTH DAILY. 12/27/22   Donita Brooks, MD  montelukast (SINGULAIR) 10 MG tablet TAKE 1 TABLET (10 MG TOTAL) BY MOUTH AT BEDTIME. 06/22/22   Donita Brooks, MD  RESTASIS 0.05 % ophthalmic emulsion 1 drop 2 (two) times daily. 11/09/22   [provider]  rosuvastatin (CRESTOR) 20 MG tablet TAKE 1 TABLET BY MOUTH DAILY. 09/20/22   Donita Brooks, MD  sitaGLIPtin (JANUVIA) 100 MG  tablet Take 1 tablet by mouth once daily 01/17/23   Donita Brooks, MD  valsartan (DIOVAN) 160 MG tablet TAKE 1 TABLET BY MOUTH 2 TIMES DAILY. 12/17/22   Donita Brooks, MD  vitamin B-12 (CYANOCOBALAMIN) 1000 MCG tablet Take 1,000 mcg by mouth daily.    [provider]      Allergies    Lisinopril-hydrochlorothiazide and Doxycycline    Review of Systems   Review of Systems  Constitutional:  Positive for fatigue and fever.  Gastrointestinal:  Positive for vomiting.    Physical Exam Updated Vital Signs BP 135/70   Pulse 76   Temp 97.9 F (36.6 C) (Oral)   Resp 18   Ht 1.778 m (5\' 10" )   Wt 79.4 kg   SpO2 96%   BMI 25.11 kg/m  Physical Exam CONSTITUTIONAL: Elderly, frail, no acute distress HEAD: Normocephalic/atraumatic, healing lesions noted to his forehead EYES: EOMI/PERRL ENMT: Mucous membranes moist Area of excision noted to his left cheek, with mild erythema but no discharge NECK: supple no meningeal signs SPINE/BACK:entire spine nontender No bruising/crepitance/stepoffs noted to spine CV: S1/S2 noted LUNGS: Coarse breath sounds in the bases, no acute distress ABDOMEN: soft, nontender GU:no cva tenderness NEURO: Pt is awake/alert/appropriate EXTREMITIES: pulses normal/equal, full ROM, no deformities SKIN: warm, color normal PSYCH: no abnormalities of mood noted, alert and oriented to situation  ED Results / Procedures / Treatments   Labs (all labs ordered are listed, but only abnormal results are displayed) Labs Reviewed  COMPREHENSIVE METABOLIC PANEL - Abnormal; Notable for the following components:      Result Value   Sodium 134 (*)    Glucose, Bld 211 (*)    BUN 32 (*)    Creatinine, Ser 1.28 (*)    Calcium 8.3 (*)    Total Protein 6.2 (*)    Albumin 3.1 (*)    Alkaline Phosphatase 31 (*)    GFR, Estimated 54 (*)    All other components within normal limits  CBC WITH DIFFERENTIAL/PLATELET - Abnormal; Notable for the following components:    RBC 3.71 (*)    Hemoglobin 11.3 (*)    HCT 35.4 (*)    Lymphs Abs 0.5 (*)    Abs Immature Granulocytes 0.08 (*)    All other components within normal limits  URINALYSIS, W/ REFLEX TO CULTURE (INFECTION SUSPECTED) - Abnormal; Notable for the following components:   APPearance HAZY (*)    Protein, ur 100 (*)    Leukocytes,Ua MODERATE (*)    Bacteria, UA MANY (*)    All other components within normal limits  SARS CORONAVIRUS 2 BY RT PCR  CULTURE, BLOOD (ROUTINE X 2)  CULTURE, BLOOD (ROUTINE X 2)  URINE CULTURE  LACTIC ACID, PLASMA    EKG EKG Interpretation  Date/Time:  Monday March 21 2023 04:42:19 EDT Ventricular Rate:  74 PR Interval:  165 QRS Duration: 93 QT Interval:  375 QTC Calculation: 416 R Axis:   2 Text Interpretation: Sinus rhythm Probable anteroseptal infarct, old Confirmed by Zadie Rhine ([id number]) on 03/21/2023 4:59:38 AM  Radiology DG Chest Port 1 View  Result Date: 03/21/2023 CLINICAL DATA:  89 year old male with dizziness, headache, fever. EXAM: PORTABLE CHEST 1 VIEW COMPARISON:  Chest radiographs 09/23/2022 and earlier. FINDINGS: Portable AP semi upright view at 0444 hours. Lower lung volumes. Accounting for this mediastinal contours are stable, mildly exaggerated. Calcified aortic atherosclerosis. Visualized tracheal air column is within normal limits. Mild crowding of lung markings also. No pneumothorax, pleural effusion or consolidation. No convincing pulmonary edema. No acute osseous abnormality identified. Paucity of bowel gas in the visible abdomen. IMPRESSION: Low lung volumes, otherwise no acute cardiopulmonary abnormality. Electronically Signed   By: Odessa Fleming M.D.   On: 03/21/2023 05:52   CT Head Wo Contrast  Result Date: 03/21/2023 CLINICAL DATA:  89 year old male with dizziness, headache, fever. EXAM: CT HEAD WITHOUT CONTRAST TECHNIQUE: Contiguous axial images were obtained from the base of the skull through the vertex without intravenous contrast.  RADIATION DOSE REDUCTION: This exam was performed according to the departmental dose-optimization program which includes automated exposure control, adjustment of the mA and/or kV according to patient size and/or use of iterative reconstruction technique. COMPARISON:  Head CT 09/23/2022.  Brain MRI 07/27/2021. FINDINGS: Brain: Chronic small vessel disease. Stable from December but progressed since 2022 left corona radiata, right internal capsule lacunar infarcts. Chronic widespread and confluent bilateral cerebral white matter hypodensity. Brainstem and cerebellum remain relatively spared. No superimposed No midline shift, ventriculomegaly, mass effect, evidence of mass lesion, intracranial hemorrhage or evidence of cortically based acute infarction. Vascular: Calcified atherosclerosis at the skull base. No suspicious intracranial vascular hyperdensity. Skull: No acute osseous abnormality identified. Sinuses/Orbits: Virtually resolved bilateral paranasal sinus and left mastoid opacifications seen in December. Tympanic cavities are clear. No significant sinus disease now. Other: No acute orbit or scalp soft tissue finding. IMPRESSION: 1. No acute intracranial abnormality. Stable non contrast CT appearance of advanced small vessel disease. 2. Essentially resolved paranasal sinus disease since December. Electronically Signed   By: Odessa Fleming M.D.   On: 03/21/2023 05:51    Procedures Procedures    Medications Ordered in ED Medications  cephALEXin (KEFLEX) capsule 500 mg (has no administration in time range)    ED Course/ Medical Decision Making/ A&P Clinical Course as of 03/21/23 0658  Mon Mar 21, 2023  0440 Patient presents with generalized weakness and was found to have a fever up to 103 at home.  He also had a recent fall and reports headache.  It appears he is on Plavix.  Will obtain CT head, chest x-ray and labs for fever [DW]  0506 Glucose(!): 211 Hyperglycemia [DW]  0506 Creatinine(!): 1.28 Renal  insufficiency [DW]  6045 Patient stable, no new complaints, workup pending at this time [DW]  0657 Patient improved.  He is not septic appearing.  He can ambulate at his baseline.  Patient found to have a UTI.  This is similar to prior episodes.  Patient and son feel comfortable going home.  He has responded well to Keflex previously.  Patient is safe for outpatient management [DW]  0658 Glori Luis): MODERATE UTI noted [DW]    Clinical Course User Index [DW] Zadie Rhine, MD  Medical Decision Making Amount and/or Complexity of Data Reviewed Labs: ordered. Decision-making details documented in ED Course. Radiology: ordered. ECG/medicine tests: ordered.  Risk Prescription drug management.   This patient presents to the ED for concern of weakness, this involves an extensive number of treatment options, and is a complaint that carries with it a high risk of complications and morbidity.  The differential diagnosis includes but is not limited to CVA, intracranial hemorrhage, acute coronary syndrome, renal failure, urinary tract infection, electrolyte disturbance, pneumonia    Comorbidities that complicate the patient evaluation: Patient's presentation is complicated by their history of previous stroke  Social Determinants of Health: Patient's  difficulty ambulating   increases the complexity of managing their presentation  Additional history obtained: Additional history obtained from family Records reviewed Primary Care Documents  Lab Tests: I Ordered, and personally interpreted labs.  The pertinent results include: Hyperglycemia, UTI noted  Imaging Studies ordered: I ordered imaging studies including CT scan head and X-ray chest   I independently visualized and interpreted imaging which showed no acute findings I agree with the radiologist interpretation  Medicines ordered and prescription drug management: I ordered medication including  Keflex for UTI  Reevaluation: After the interventions noted above, I reevaluated the patient and found that they have :improved  Complexity of problems addressed: Patient's presentation is most consistent with  acute presentation with potential threat to life or bodily function  Disposition: After consideration of the diagnostic results and the patient's response to treatment,  I feel that the patent would benefit from discharge   .           Final Clinical Impression(s) / ED Diagnoses Final diagnoses:  Acute cystitis without hematuria    Rx / DC Orders ED Discharge Orders          Ordered    cephALEXin (KEFLEX) 500 MG capsule  3 times daily        03/21/23 5784              Zadie Rhine, MD 03/21/23 [phone]

## 2023-03-21 NOTE — ED Triage Notes (Signed)
Pt complaining of headache, nausea, dizziness fever earlier today that came down with Tylenol.   Alert and oriented x2. This is increasing confusion per family.   Bp 139/73 Spo2 96% on ra Pulse 80 Cbg 193

## 2023-03-22 LAB — URINE CULTURE

## 2023-03-22 LAB — CULTURE, BLOOD (ROUTINE X 2)

## 2023-03-23 LAB — CULTURE, BLOOD (ROUTINE X 2)
Culture: NO GROWTH
Culture: NO GROWTH

## 2023-03-23 LAB — URINE CULTURE: Culture: >=100000 — AB

## 2023-03-24 ENCOUNTER — Telehealth (HOSPITAL_BASED_OUTPATIENT_CLINIC_OR_DEPARTMENT_OTHER): Payer: Self-pay

## 2023-03-24 LAB — CULTURE, BLOOD (ROUTINE X 2)

## 2023-03-24 NOTE — Progress Notes (Signed)
ED Antimicrobial Stewardship Positive Culture Follow Up   Barry [name] is an 89 y.o. male who presented to [facility] on 03/21/2023 with a chief complaint of  Chief Complaint  Patient presents with   Dizziness   Fever    Recent Results (from the past 720 hour(s))  Culture, blood (Routine x 2)     Status: None (Preliminary result)   Collection Time: 03/21/23  4:33 AM   Specimen: BLOOD  Result Value Ref Range Status   Specimen Description   Final    BLOOD BLOOD LEFT FOREARM Performed at [facility], [address] [place], [place] [address]    Special Requests   Final    BOTTLES DRAWN AEROBIC AND ANAEROBIC Blood Culture adequate volume Performed at [facility], [address], [place], [place] [address]    Culture   Final    NO GROWTH 3 DAYS Performed at [facility], [address], [place], [place] [address]    Report Status PENDING  Incomplete  Culture, blood (Routine x 2)     Status: None (Preliminary result)   Collection Time: 03/21/23  4:40 AM   Specimen: BLOOD  Result Value Ref Range Status   Specimen Description   Final    BLOOD BLOOD RIGHT FOREARM Performed at [facility], [address], [place], [place] [address]    Special Requests   Final    BOTTLES DRAWN AEROBIC AND ANAEROBIC Blood Culture adequate volume Performed at [facility], [address], [place], [place] [address]    Culture   Final    NO GROWTH 3 DAYS Performed at [facility], [address] [place], [place] [address]    Report Status PENDING  Incomplete  SARS Coronavirus 2 by RT PCR (hospital order, performed in [facility] hospital lab) *cepheid single result test* Anterior Nasal Swab     Status: None   Collection Time: 03/21/23  5:20 AM   Specimen: Anterior Nasal Swab  Result Value Ref Range Status   SARS Coronavirus 2 by RT PCR NEGATIVE NEGATIVE Final    Comment: (NOTE) SARS-CoV-2 target  nucleic acids are NOT DETECTED.  The SARS-CoV-2 RNA is generally detectable in upper and lower respiratory specimens during the acute phase of infection. The lowest concentration of SARS-CoV-2 viral copies this assay can detect is 250 copies / mL. A negative result does not preclude SARS-CoV-2 infection and should not be used as the sole basis for treatment or other patient management decisions.  A negative result may occur with improper specimen collection / handling, submission of specimen other than nasopharyngeal swab, presence of viral mutation(s) within the areas targeted by this assay, and inadequate number of viral copies (<250 copies / mL). A negative result must be combined with clinical observations, patient history, and epidemiological information.  Fact Sheet for Patients:   RoadLapTop.co.za  Fact Sheet for Healthcare Providers: http://kim-miller.com/  This test is not yet approved or  cleared by the [place] FDA and has been authorized for detection and/or diagnosis of SARS-CoV-2 by FDA under an Emergency Use Authorization (EUA).  This EUA will remain in effect (meaning this test can be used) for the duration of the COVID-19 declaration under Section 564(b)(1) of the Act, 21 U.S.C. section 360bbb-3(b)(1), unless the authorization is terminated or revoked sooner.  Performed at [facility], [address] [place], [place] [address]   Urine Culture     Status:  Abnormal   Collection Time: 03/21/23  5:43 AM   Specimen: Urine, Random  Result Value Ref Range Status   Specimen Description   Final    URINE, RANDOM Performed at [facility], [address] [place], [place] [address]    Special Requests   Final    NONE Reflexed from [id number] Performed at [facility], [address] [place], [place] [address]    Culture (A)  Final    >=100,000 COLONIES/mL  KLEBSIELLA PNEUMONIAE Confirmed Extended Spectrum Beta-Lactamase Producer (ESBL).  In bloodstream infections from ESBL organisms, carbapenems are preferred over piperacillin/tazobactam. They are shown to have a lower risk of mortality.    Report Status 03/23/2023 FINAL  Final   Organism ID, Bacteria KLEBSIELLA PNEUMONIAE (A)  Final      Susceptibility   Klebsiella pneumoniae - MIC*    AMPICILLIN >=32 RESISTANT Resistant     CEFAZOLIN >=64 RESISTANT Resistant     CEFEPIME >=32 RESISTANT Resistant     CEFTRIAXONE >=64 RESISTANT Resistant     CIPROFLOXACIN 0.5 INTERMEDIATE Intermediate     GENTAMICIN <=1 SENSITIVE Sensitive     IMIPENEM 0.5 SENSITIVE Sensitive     NITROFURANTOIN 64 INTERMEDIATE Intermediate     TRIMETH/SULFA <=20 SENSITIVE Sensitive     AMPICILLIN/SULBACTAM 8 SENSITIVE Sensitive     PIP/TAZO <=4 SENSITIVE Sensitive     * >=100,000 COLONIES/mL KLEBSIELLA PNEUMONIAE    [x]  Treated with Cephalexin 500 mg PO CAPS, organism resistant to prescribed antimicrobial  New antibiotic prescription: Bactrim 1 DS tablet BID for 3 days.Stop Cephalexin.   ED Provider: Aline August, PA-C   Erasmo Leventhal, Pharm D Candidate 03/24/2023, 9:00 AM Clinical Pharmacist Monday - Friday phone -  [phone] Saturday - Sunday phone - [phone]

## 2023-03-24 NOTE — Telephone Encounter (Signed)
Post ED Visit - Positive Culture Follow-up: Successful Patient Follow-Up  Culture assessed and recommendations reviewed by:  [x]  Sharin Mons, Pharm.D. []  Celedonio Miyamoto, Pharm.D., BCPS AQ-ID []  Garvin Fila, Pharm.D., BCPS []  Georgina Pillion, Pharm.D., BCPS []  [place], [address].D., BCPS, AAHIVP []  Estella Husk, Pharm.D., BCPS, AAHIVP []  Lysle Pearl, PharmD, BCPS []  Phillips Climes, PharmD, BCPS []  Agapito Games, PharmD, BCPS []  Verlan Friends, PharmD  Positive urine culture  []  Patient discharged without antimicrobial prescription and treatment is now indicated [x]  Organism is resistant to prescribed ED discharge antimicrobial []  Patient with positive blood cultures  Changes discussed with ED provider: Aline August, PA-C New antibiotic prescription Bactrim DS 1 tab BID x 3 days Called to [facility], [place], [place]  Contacted patient son, date 03/24/23, time 9:45 am   Sandria Senter 03/24/2023, 9:48 AM

## 2023-03-25 ENCOUNTER — Telehealth: Payer: Self-pay

## 2023-03-25 NOTE — Telephone Encounter (Signed)
Transition Care Management Follow-up Telephone Call Date of discharge and from where: 03/21/2023 [facility] How have you been since you were released from the hospital? Patient is feeling better Any questions or concerns? No  Items Reviewed: Did the pt receive and understand the discharge instructions provided? Yes  Medications obtained and verified? Yes  Other? No  Any new allergies since your discharge? No  Dietary orders reviewed? Yes Do you have support at home? Yes   Follow up appointments reviewed:  PCP Hospital f/u appt confirmed? No  Scheduled to see  on  @ . Specialist Hospital f/u appt confirmed? No  Scheduled to see  on  @ . Are transportation arrangements needed? No  If their condition worsens, is the pt aware to call PCP or go to the Emergency Dept.? Yes Was the patient provided with contact information for the PCP's office or ED? Yes Was to pt encouraged to call back with questions or concerns? Yes  Joyanna Kleman [facility]  [facility] Community Resource Care Guide   [email or web address]  ?? [phone]   Website: [email or web address]  [email or web address]

## 2023-03-26 LAB — CULTURE, BLOOD (ROUTINE X 2)
Special Requests: ADEQUATE
Special Requests: ADEQUATE

## 2023-04-20 ENCOUNTER — Other Ambulatory Visit: Payer: Self-pay | Admitting: Family Medicine

## 2023-04-20 DIAGNOSIS — E119 Type 2 diabetes mellitus without complications: Secondary | ICD-10-CM

## 2023-04-20 DIAGNOSIS — I2510 Atherosclerotic heart disease of native coronary artery without angina pectoris: Secondary | ICD-10-CM

## 2023-04-20 MED ORDER — EZETIMIBE 10 MG PO TABS
10.0000 mg | ORAL_TABLET | Freq: Every day | ORAL | 0 refills | Status: DC
Start: 2023-04-20 — End: 2023-06-06

## 2023-04-20 MED ORDER — SITAGLIPTIN PHOSPHATE 100 MG PO TABS
100.0000 mg | ORAL_TABLET | Freq: Every day | ORAL | 0 refills | Status: DC
Start: 2023-04-20 — End: 2023-08-26

## 2023-04-24 ENCOUNTER — Other Ambulatory Visit: Payer: Self-pay | Admitting: Family Medicine

## 2023-04-24 DIAGNOSIS — E119 Type 2 diabetes mellitus without complications: Secondary | ICD-10-CM

## 2023-04-25 ENCOUNTER — Other Ambulatory Visit: Payer: Self-pay | Admitting: Family Medicine

## 2023-04-25 ENCOUNTER — Telehealth: Payer: Self-pay

## 2023-04-25 DIAGNOSIS — I350 Nonrheumatic aortic (valve) stenosis: Secondary | ICD-10-CM

## 2023-04-25 DIAGNOSIS — I10 Essential (primary) hypertension: Secondary | ICD-10-CM

## 2023-04-25 DIAGNOSIS — I639 Cerebral infarction, unspecified: Secondary | ICD-10-CM

## 2023-04-25 NOTE — Telephone Encounter (Signed)
Pt's son called in to ask nurse about pt's med Januvia. Pt's son states that this med is becoming too expensive. Pt son would like to talk with nurse/pcp about an alternative to this med possibly Metformin or getting pt some assistance with getting the Januvia. Please advise.  Cb#: Khareem Klomp pt's son at [phone]

## 2023-04-25 NOTE — Telephone Encounter (Signed)
Requested Prescriptions  Refused Prescriptions Disp Refills   JANUVIA 100 MG tablet [Pharmacy Med Name: Januvia 100 MG Oral Tablet] 90 tablet 0    Sig: Take 1 tablet by mouth once daily     Endocrinology:  Diabetes - DPP-4 Inhibitors Failed - 04/24/2023  2:35 PM      Failed - Cr in normal range and within 360 days    Creat  Date Value Ref Range Status  11/15/2022 1.40 (H) 0.70 - 1.22 mg/dL Final   Creatinine, Ser  Date Value Ref Range Status  03/21/2023 1.28 (H) 0.61 - 1.24 mg/dL Final   Creatinine, Urine  Date Value Ref Range Status  03/19/2021 116 20 - 320 mg/dL Final         Failed - Valid encounter within last 6 months    Recent Outpatient Visits           1 year ago Type 2 diabetes mellitus with hyperglycemia, without long-term current use of insulin (HCC)   [facility] Pickard, Priscille Heidelberg, MD   1 year ago Dysuria   [facility] Donita Brooks, MD   1 year ago Essential hypertension   [facility] Tanya Nones, Priscille Heidelberg, MD   1 year ago Mass of parotid gland   [facility] Donita Brooks, MD   2 years ago Dizziness and giddiness   [facility] Pickard, Priscille Heidelberg, MD              Passed - HBA1C is between 0 and 7.9 and within 180 days    Hgb A1c MFr Bld  Date Value Ref Range Status  11/15/2022 7.2 (H) <5.7 % of total Hgb Final    Comment:    For someone without known diabetes, a hemoglobin A1c value of 6.5% or greater indicates that they may have  diabetes and this should be confirmed with a follow-up  test. . For someone with known diabetes, a value <7% indicates  that their diabetes is well controlled and a value  greater than or equal to 7% indicates suboptimal  control. A1c targets should be individualized based on  duration of diabetes, age, comorbid conditions, and  other considerations. . Currently, no consensus exists regarding use of hemoglobin A1c for diagnosis of  diabetes for children. Marland Kitchen

## 2023-04-26 ENCOUNTER — Other Ambulatory Visit: Payer: Self-pay

## 2023-04-26 DIAGNOSIS — E1165 Type 2 diabetes mellitus with hyperglycemia: Secondary | ICD-10-CM

## 2023-04-26 MED ORDER — PIOGLITAZONE HCL 30 MG PO TABS
30.0000 mg | ORAL_TABLET | Freq: Every day | ORAL | 3 refills | Status: DC
Start: 2023-04-26 — End: 2023-08-26

## 2023-05-04 ENCOUNTER — Ambulatory Visit
Admission: EM | Admit: 2023-05-04 | Discharge: 2023-05-04 | Disposition: A | Discharge status: Home / Self Care | Payer: Medicare Other | Attending: Internal Medicine | Admitting: Internal Medicine

## 2023-05-04 DIAGNOSIS — E46 Unspecified protein-calorie malnutrition: Secondary | ICD-10-CM | POA: Diagnosis not present

## 2023-05-04 DIAGNOSIS — R531 Weakness: Secondary | ICD-10-CM | POA: Diagnosis not present

## 2023-05-04 DIAGNOSIS — Z1152 Encounter for screening for COVID-19: Secondary | ICD-10-CM | POA: Insufficient documentation

## 2023-05-04 DIAGNOSIS — B349 Viral infection, unspecified: Secondary | ICD-10-CM

## 2023-05-04 LAB — POCT URINALYSIS DIP (MANUAL ENTRY)
Bilirubin, UA: NEGATIVE
Blood, UA: NEGATIVE
Glucose, UA: NEGATIVE mg/dL
Ketones, POC UA: NEGATIVE mg/dL
Leukocytes, UA: NEGATIVE
Nitrite, UA: NEGATIVE
Protein Ur, POC: >=300 mg/dL — AB
Spec Grav, UA: >=1.03 — AB (ref 1.010–1.025)
Urobilinogen, UA: 1 E.U./dL
pH, UA: 5.5 (ref 5.0–8.0)

## 2023-05-04 LAB — POCT INFLUENZA A/B
Influenza A, POC: NEGATIVE
Influenza B, POC: NEGATIVE

## 2023-05-04 NOTE — ED Provider Notes (Addendum)
[name]    CSN: [id number] Arrival date & time: 05/04/23  1428      History   Chief Complaint No chief complaint on file.   HPI Barry [name] is a 89 y.o. male.   Patient presents with his son who helps provide history.  They report the patient has been generally feeling bad over the past 2 days with generalized weakness.  Patient reports that he developed some nasal drainage today as well.  Denies coughing or fever.  Denies body aches or chills.  Patient's son is concerned about UTI given he has been previously asymptomatic regarding urinary symptoms with UTI.  He has also had 3 UTIs in the past 3 months with the last 1 being in June.  Patient currently denies any dysuria, urinary frequency, hematuria.  Patient does have a history of CVA approximately 1.5 years ago and has left-sided weakness at baseline with confusion at baseline as well.  Patient's son reports that confusion is no different from baseline at this time.  Denies any obvious known sick contacts.  Patient denies chest pain, shortness of breath, headache, dizziness, nausea, vomiting, diarrhea.  Patient having normal bowel movements with no blood in stool. Denies any recent falls or head injuries.      Past Medical History:  Diagnosis Date   AAA (abdominal aortic aneurysm) (HCC) 12/17/2007   4.7 cm   Allergy    rhinitis   Anxiety    Cancer (HCC)    skin   Cataract    Depression    Diabetes mellitus    Diverticulosis    GERD (gastroesophageal reflux disease)    Hepatitis    Hx of colonic polyps 08/26/2015   Hyperlipidemia    Hypertension    Nephrolithiasis    Personal history of colonic polyps    adenomas, serrated also   PVD (peripheral vascular disease) (HCC)    Stroke (HCC)    Tobacco abuse     Patient Active Problem List   Diagnosis Date Noted   Subacute cough 09/22/2022   Right thalamic infarction (HCC) 07/31/2021   Stage 3a chronic kidney disease (HCC)    Benign essential HTN     Left hemiparesis (HCC)    Type 2 diabetes mellitus with hyperglycemia, without long-term current use of insulin (HCC)    CVA (cerebral vascular accident) (HCC) 07/27/2021   Frequent PVCs 03/20/2020   Aortic stenosis 03/20/2020   COPD (chronic obstructive pulmonary disease) (HCC) 03/20/2020   Constipation 04/30/2019   Protein-calorie malnutrition (HCC) 04/30/2019   Anxiety with depression 12/05/2018   Seasonal and perennial allergic rhinitis 08/09/2018   Carotid artery disease (HCC) 11/18/2017   Hx of colonic polyps 08/26/2015   Erectile dysfunction 02/17/2015   AAA (abdominal aortic aneurysm) without rupture (HCC) 05/21/2014   Chest pain 01/31/2014   CAD (coronary artery disease) 12/24/2013   HEARING LOSS, CONDUCTIVE, LEFT 02/23/2010   DIZZINESS, CHRONIC 05/28/2008   PULMONARY NODULE 09/07/2007   Diabetes mellitus type II, controlled (HCC) 09/06/2007   Hyperlipidemia 09/06/2007   TOBACCO ABUSE 09/06/2007   Essential hypertension 09/06/2007   Allergic rhinitis 09/06/2007   GERD 09/06/2007   BASAL CELL CARCINOMA, NOSE 09/06/2007    Past Surgical History:  Procedure Laterality Date   COLONOSCOPY  2006, 2008, 2010, 05/27/2011   numerous adenomas - 13 in 2006, 3, 2008, 2 2012 (up to 1 cm), 4 diminutive adenomas, serrated adenomas 2012. Diverticulosis and hemorrhoids also.   EYE SURGERY     EYE SURGERY Left  Kidney stone operation     lower limb amputation, other toe 5th     percutaneous stent graft repair of infrarenal AAA  11/10   5.6 cm (T. early)   surgical excision basal cell carcinoma     TEE WITHOUT CARDIOVERSION N/A 07/29/2021   Procedure: TRANSESOPHAGEAL ECHOCARDIOGRAM (TEE);  Surgeon: Sande Rives, MD;  Location: [facility];  Service: Cardiovascular;  Laterality: N/A;   tympanic eardrum repair     VASECTOMY         Home Medications      Family History Family History  Problem Relation Age of Onset   Emphysema Father    Cancer Brother        Lung    Depression Brother    Depression Sister    Lung disease Sister        Fibrosis and died from pneumonia on 03-27-2013   Kidney disease Sister    Diabetes Sister    Learning disabilities Sister    Mental retardation Sister    Heart disease Mother    Cancer Brother    Colon cancer Neg Hx    Dementia Neg Hx    Alcohol abuse Neg Hx    Drug abuse Neg Hx    Paranoid behavior Neg Hx    Schizophrenia Neg Hx    Anxiety disorder Neg Hx    Bipolar disorder Neg Hx    OCD Neg Hx    Sexual abuse Neg Hx    Physical abuse Neg Hx    Seizures Neg Hx    Esophageal cancer Neg Hx    Stomach cancer Neg Hx    Rectal cancer Neg Hx     Social History Social History   Tobacco Use   Smoking status: Former    Current packs/day: 1.00    Average packs/day: 1 pack/day for 60.0 years (60.0 ttl pk-yrs)    Types: Cigarettes   Smokeless tobacco: Never   Tobacco comments:    Hasn't smoked since he had his stroke   Vaping Use   Vaping status: Never Used  Substance Use Topics   Alcohol use: No   Drug use: No     Allergies   Lisinopril-hydrochlorothiazide and Doxycycline   Review of Systems Review of Systems Per HPI  Physical Exam Triage Vital Signs ED Triage Vitals [05/04/23 1440]  Encounter Vitals Group     BP (!) 153/72     Systolic BP Percentile      Diastolic BP Percentile      Pulse Rate 66     Resp 16     Temp 97.9 F (36.6 C)     Temp Source Oral     SpO2 94 %     Weight      Height      Head Circumference      Peak Flow      Pain Score 0     Pain Loc      Pain Education      Exclude from Growth Chart    No data found.  Updated Vital Signs BP (!) 153/72 (BP Location: Left Arm)   Pulse 66   Temp 97.9 F (36.6 C) (Oral)   Resp 16   SpO2 95%   Visual Acuity Right Eye Distance:   Left Eye Distance:   Bilateral Distance:    Right Eye Near:   Left Eye Near:    Bilateral Near:     Physical Exam Constitutional:  General: He is not in acute distress.     Appearance: Normal appearance. He is not toxic-appearing or diaphoretic.  HENT:     Head: Normocephalic and atraumatic.     Right Ear: Tympanic membrane and ear canal normal.     Left Ear: Tympanic membrane and ear canal normal.     Nose: Congestion present.     Mouth/Throat:     Mouth: Mucous membranes are moist.     Pharynx: No posterior oropharyngeal erythema.  Eyes:     Extraocular Movements: Extraocular movements intact.     Conjunctiva/sclera: Conjunctivae normal.     Pupils: Pupils are equal, round, and reactive to light.  Cardiovascular:     Rate and Rhythm: Normal rate and regular rhythm.     Pulses: Normal pulses.     Heart sounds: Murmur heard.     Comments: Patient reports murmur at baseline.  Pulmonary:     Effort: Pulmonary effort is normal. No respiratory distress.     Breath sounds: Normal breath sounds. No stridor. No wheezing, rhonchi or rales.  Abdominal:     General: Abdomen is flat. Bowel sounds are normal. There is no distension.     Palpations: Abdomen is soft.     Tenderness: There is no abdominal tenderness.  Musculoskeletal:        General: Normal range of motion.     Cervical back: Normal range of motion.  Skin:    General: Skin is warm and dry.  Neurological:     General: No focal deficit present.     Mental Status: He is alert and oriented to person, place, and time. Mental status is at baseline.     Cranial Nerves: Cranial nerves 2-12 are intact.     Sensory: Sensation is intact.     Comments: In wheelchair given left sided weakness at baseline. Difficult to assess motor function, gait, and coordination.   Psychiatric:        Mood and Affect: Mood normal.        Behavior: Behavior normal.      UC Treatments / Results  Labs (all labs ordered are listed, but only abnormal results are displayed) Labs Reviewed  POCT URINALYSIS DIP (MANUAL ENTRY) - Abnormal; Notable for the following components:      Result Value   Spec Grav, UA >=1.030 (*)     Protein Ur, POC >=300 (*)    All other components within normal limits  SARS CORONAVIRUS 2 (TAT 6-24 HRS)  COMPREHENSIVE METABOLIC PANEL  CBC  POCT INFLUENZA A/B    EKG   Radiology No results found.  Procedures Procedures (including critical care time)  Medications Ordered in UC Medications - No data to display  Initial Impression / Assessment and Plan / UC Course  I have reviewed the triage vital signs and the nursing notes.  Pertinent labs & imaging results that were available during my care of the patient were reviewed by me and considered in my medical decision making (see chart for details).     Patient's vital signs are stable and patient is at baseline mental status and neuroexam per son.  EKG was also normal sinus rhythm so do not think that emergent evaluation is necessary at this time.  Patient is at baseline mental and neuro status so do not think that imaging of the head is necessary. UA completed that did not show any sign of UTI but did show large amount of protein.  Therefore, will obtain a CMP  and CBC to check kidney function to rule any worrisome etiology related to current symptoms. Awaiting results as we do not have STAT bloodwork available at urgent care.  I am most suspicious that patient most likely has a viral illness given he is reporting generalized weakness, generally not feeling well, and nasal drainage.  Rapid flu was negative.  COVID test is pending.  There are no adventitious lung sounds on exam and patient denies cough so chest imaging was deferred.  Advised supportive care related to this.  Advised strict ER precautions if symptoms persist or worsen.  Patient to follow up with PCP. Patient and son verbalized understanding and were agreeable with plan. Final Clinical Impressions(s) / UC Diagnoses   Final diagnoses:  General weakness  Viral illness     Discharge Instructions      Urine was clear from infection.  EKG of the heart was normal.  The  flu test was negative.  COVID test and blood work are pending.  We will call if anything is abnormal.  If symptoms persist or worsen, please follow-up for further evaluation and management at the emergency department.     ED Prescriptions   None    PDMP not reviewed this encounter.   Gustavus Bryant, [place] 05/04/23 1652    Gustavus Bryant, [place] 05/04/23 1653    Gustavus Bryant, [place] 05/04/23 [phone]

## 2023-05-04 NOTE — ED Notes (Signed)
Pt could not  produce any urine after a cup of water. Per son, they want to take it home to urinate. Provider would like results today.  Pt given more water.

## 2023-05-04 NOTE — ED Triage Notes (Signed)
Pt reports he over all feels bad x 2 days. Denies frequent urination, lower abdominal pain, burning with urination, and urgency.   Son states he has been more confused that usual. Persistent with previous UTIs.

## 2023-05-04 NOTE — Discharge Instructions (Addendum)
Urine was clear from infection.  EKG of the heart was normal.  The flu test was negative.  COVID test and blood work are pending.  We will call if anything is abnormal.  If symptoms persist or worsen, please follow-up for further evaluation and management at the emergency department.

## 2023-05-05 LAB — CBC

## 2023-05-05 LAB — COMPREHENSIVE METABOLIC PANEL
ALT: 12 IU/L (ref 0–44)
AST: 19 IU/L (ref 0–40)
Albumin: 4.2 g/dL (ref 3.7–4.7)
Alkaline Phosphatase: 51 IU/L (ref 44–121)
BUN/Creatinine Ratio: 26 — ABNORMAL HIGH (ref 10–24)
BUN: 33 mg/dL — ABNORMAL HIGH (ref 8–27)
Bilirubin Total: <0.2 mg/dL (ref 0.0–1.2)
CO2: 24 mmol/L (ref 20–29)
Calcium: 9.2 mg/dL (ref 8.6–10.2)
Chloride: 100 mmol/L (ref 96–106)
Creatinine, Ser: 1.28 mg/dL — ABNORMAL HIGH (ref 0.76–1.27)
Globulin, Total: 2.6 g/dL (ref 1.5–4.5)
Glucose: 84 mg/dL (ref 70–99)
Potassium: 4.7 mmol/L (ref 3.5–5.2)
Sodium: 136 mmol/L (ref 134–144)
Total Protein: 6.8 g/dL (ref 6.0–8.5)
eGFR: 54 mL/min/{1.73_m2} — ABNORMAL LOW (ref >59)

## 2023-05-05 LAB — SARS CORONAVIRUS 2 (TAT 6-24 HRS): SARS Coronavirus 2: NEGATIVE

## 2023-05-08 ENCOUNTER — Ambulatory Visit
Admission: EM | Admit: 2023-05-08 | Discharge: 2023-05-08 | Disposition: A | Discharge status: Home / Self Care | Payer: Medicare Other | Attending: Physician Assistant | Admitting: Physician Assistant

## 2023-05-08 DIAGNOSIS — Z0189 Encounter for other specified special examinations: Secondary | ICD-10-CM | POA: Diagnosis not present

## 2023-05-08 NOTE — ED Provider Notes (Signed)
Lab visit only- recollection.    Tomi Bamberger, PA-C 05/08/23 1320

## 2023-05-08 NOTE — ED Triage Notes (Signed)
Previous clotted sample.  Son wished to have drawn here "where it was ordered".   B. Roten CMA

## 2023-05-09 LAB — CBC WITH DIFFERENTIAL/PLATELET
Basophils Absolute: 0.1 10*3/uL (ref 0.0–0.2)
Basos: 1 %
EOS (ABSOLUTE): 0.2 10*3/uL (ref 0.0–0.4)
Eos: 3 %
Hematocrit: 40.8 % (ref 37.5–51.0)
Hemoglobin: 12.6 g/dL — ABNORMAL LOW (ref 13.0–17.7)
Immature Grans (Abs): 0 10*3/uL (ref 0.0–0.1)
Immature Granulocytes: 1 %
Lymphocytes Absolute: 1.6 10*3/uL (ref 0.7–3.1)
Lymphs: 23 %
MCH: 29.8 pg (ref 26.6–33.0)
MCHC: 30.9 g/dL — ABNORMAL LOW (ref 31.5–35.7)
MCV: 97 fL (ref 79–97)
Monocytes Absolute: 1.1 10*3/uL — ABNORMAL HIGH (ref 0.1–0.9)
Monocytes: 16 %
Neutrophils Absolute: 4 10*3/uL (ref 1.4–7.0)
Neutrophils: 56 %
Platelets: 176 10*3/uL (ref 150–450)
RBC: 4.23 x10E6/uL (ref 4.14–5.80)
RDW: 14.1 % (ref 11.6–15.4)
WBC: 7 10*3/uL (ref 3.4–10.8)

## 2023-05-16 ENCOUNTER — Ambulatory Visit: Payer: Medicare Other | Admitting: Family Medicine

## 2023-05-17 ENCOUNTER — Encounter: Payer: Self-pay | Admitting: Family Medicine

## 2023-05-17 ENCOUNTER — Ambulatory Visit (INDEPENDENT_AMBULATORY_CARE_PROVIDER_SITE_OTHER): Payer: Medicare Other | Admitting: Family Medicine

## 2023-05-17 VITALS — BP 120/64 | HR 69 | Temp 98.4°F | Ht 70.0 in | Wt 175.8 lb

## 2023-05-17 DIAGNOSIS — G8929 Other chronic pain: Secondary | ICD-10-CM

## 2023-05-17 DIAGNOSIS — M25512 Pain in left shoulder: Secondary | ICD-10-CM | POA: Diagnosis not present

## 2023-05-17 DIAGNOSIS — T1592XA Foreign body on external eye, part unspecified, left eye, initial encounter: Secondary | ICD-10-CM

## 2023-05-17 DIAGNOSIS — M25511 Pain in right shoulder: Secondary | ICD-10-CM | POA: Diagnosis not present

## 2023-05-17 MED ORDER — LIDOCAINE HCL (PF) 1 % IJ SOLN
4.0000 mL | Freq: Once | INTRAMUSCULAR | Status: AC
Start: 2023-05-17 — End: 2023-05-17
  Administered 2023-05-17: 4 mL

## 2023-05-17 MED ORDER — POLYMYXIN B-TRIMETHOPRIM 10000-0.1 UNIT/ML-% OP SOLN: 2.0000 [drp] | OPHTHALMIC | 0 refills | Status: DC

## 2023-05-17 MED ORDER — BUPIVACAINE HCL 0.25 % IJ SOLN
4.0000 mL | Freq: Once | INTRAMUSCULAR | Status: AC
Start: 2023-05-17 — End: 2023-05-17
  Administered 2023-05-17: 4 mL via INTRA_ARTICULAR

## 2023-05-17 MED ORDER — TRIAMCINOLONE ACETONIDE 40 MG/ML IJ SUSP
80.0000 mg | Freq: Once | INTRAMUSCULAR | Status: AC
Start: 2023-05-17 — End: 2023-05-17
  Administered 2023-05-17: 80 mg via INTRA_ARTICULAR

## 2023-05-17 NOTE — Addendum Note (Signed)
Addended by: Darral Dash on: 05/17/2023 04:47 PM   Modules accepted: Orders

## 2023-05-17 NOTE — Progress Notes (Signed)
Subjective:    Patient ID: Barry [name], male    DOB: 01-13-1933, 89 y.o.   MRN: [id number]  Shoulder Pain   Eye Problem     Patient has a foreign body in his left eye.  At the edge of his iris at approximately 4:00, there is a small 1 mm black foreign material.  I numbed up the patient's eye with tetracaine and try to remove the foreign material with a Q-tip.  Despite sleeping with a Q-tip several times the material could not be dislodged from the eye.  Patient reports a foreign body sensation but no blurry vision.  He appears to have a corneal abrasion around the foreign body.  He is also requesting cortisone injections in both shoulders.  He has a longstanding history of bilateral shoulder pain for which he receives cortisone injections in the subacromial space every 3 to 6 months.  He is requesting that I repeat that today Past Medical History:  Diagnosis Date   AAA (abdominal aortic aneurysm) (HCC) 12/17/2007   4.7 cm   Allergy    rhinitis   Anxiety    Cancer (HCC)    skin   Cataract    Depression    Diabetes mellitus    Diverticulosis    GERD (gastroesophageal reflux disease)    Hepatitis    Hx of colonic polyps 08/26/2015   Hyperlipidemia    Hypertension    Nephrolithiasis    Personal history of colonic polyps    adenomas, serrated also   PVD (peripheral vascular disease) (HCC)    Stroke (HCC)    Tobacco abuse    Past Surgical History:  Procedure Laterality Date   COLONOSCOPY  2006, 2008, 2010, 05/27/2011   numerous adenomas - 13 in 2006, 3, 2008, 2 2012 (up to 1 cm), 4 diminutive adenomas, serrated adenomas 2012. Diverticulosis and hemorrhoids also.   EYE SURGERY     EYE SURGERY Left    Kidney stone operation     lower limb amputation, other toe 5th     percutaneous stent graft repair of infrarenal AAA  11/10   5.6 cm (T. early)   surgical excision basal cell carcinoma     TEE WITHOUT CARDIOVERSION N/A 07/29/2021   Procedure: TRANSESOPHAGEAL  ECHOCARDIOGRAM (TEE);  Surgeon: Sande Rives, MD;  Location: [facility];  Service: Cardiovascular;  Laterality: N/A;   tympanic eardrum repair     VASECTOMY     Med list reviewed in epic.     Allergies  Allergen Reactions   Lisinopril-Hydrochlorothiazide Swelling    ZESTRIL   Doxycycline Rash   Social History   Socioeconomic History   Marital status: Widowed    Spouse name: Not on file   Number of children: 2   Years of education: Not on file   Highest education level: Not on file  Occupational History    Employer: RETIRED  Tobacco Use   Smoking status: Former    Current packs/day: 1.00    Average packs/day: 1 pack/day for 60.0 years (60.0 ttl pk-yrs)    Types: Cigarettes   Smokeless tobacco: Never   Tobacco comments:    Hasn't smoked since he had his stroke   Vaping Use   Vaping status: Never Used  Substance and Sexual Activity   Alcohol use: No   Drug use: No   Sexual activity: Yes    Partners: Female    Birth control/protection: Surgical    Comment: vasectomy  Other Topics Concern  Not on file  Social History Narrative   11/11/21 living with son, Barry [name]   HSG, 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 Lives alone. 1- step-daughter, 1 son - '68; 3 grandchildren. does odd-jobs, yard work   Chemical engineer Strain: Low Risk  (01/06/2023)   Overall Financial Resource Strain (CARDIA)    Difficulty of Paying Living Expenses: Not hard at all  Food Insecurity: No Food Insecurity (01/06/2023)   Hunger Vital Sign    Worried About Running Out of Food in the Last Year: Never true    Ran Out of Food in the Last Year: Never true  Transportation Needs: No Transportation Needs (01/06/2023)   PRAPARE - Administrator, Civil Service (Medical): No    Lack of Transportation (Non-Medical): No  Physical Activity: Inactive (01/06/2023)   Exercise Vital Sign    Days of Exercise per Week: 0 days     Minutes of Exercise per Session: 0 min  Stress: No Stress Concern Present (01/06/2023)   [employer] of Occupational Health - Occupational Stress Questionnaire    Feeling of Stress : Only a little  Social Connections: Moderately Isolated (01/06/2023)   Social Connection and Isolation Panel [NHANES]    Frequency of Communication with Friends and Family: More than three times a week    Frequency of Social Gatherings with Friends and Family: More than three times a week    Attends Religious Services: 1 to 4 times per year    Active Member of [employer] or Organizations: No    Attends Banker Meetings: Never    Marital Status: Widowed  Intimate Partner Violence: Not At Risk (01/06/2023)   Humiliation, Afraid, Rape, and Kick questionnaire    Fear of Current or Ex-Partner: No    Emotionally Abused: No    Physically Abused: No    Sexually Abused: No     Review of Systems  All other systems reviewed and are negative.      Objective:   Physical Exam Vitals reviewed.  Constitutional:      General: He is not in acute distress.    Appearance: He is not toxic-appearing.  Eyes:     General:        Left eye: Foreign body present.  Cardiovascular:     Rate and Rhythm: Normal rate.     Heart sounds: Murmur heard.  Pulmonary:     Effort: Pulmonary effort is normal.     Breath sounds: Normal breath sounds. No wheezing.  Musculoskeletal:     Right lower leg: No edema.     Left lower leg: No edema.  Neurological:     General: No focal deficit present.     Mental Status: He is alert and oriented to person, place, and time.     Cranial Nerves: No cranial nerve deficit.     Sensory: No sensory deficit.     Coordination: Coordination normal.     Gait: Gait abnormal.     Deep Tendon Reflexes: Reflexes normal.           Assessment & Plan:  Foreign body, eye, left, initial encounter - Plan: Ambulatory referral to Ophthalmology  Chronic pain of both shoulders  Patient  deals with chronic pain in both shoulders.  He would like another cortisone injection as this gives him relief for approximately 2 months.  Using sterile technique, I injected the left subacromial space with 2 cc of lidocaine,  2 cc of Marcaine, and 2 cc of 40 mg/mL Kenalog.  The patient tolerated the procedure well without complication.  I then repeated the same injection in the right shoulder using sterile technique.  The patient tolerated the procedure well.  Begin Polytrim drops 2 drops every 4 hours in the left eye to prevent infection.  Consult ophthalmology to remove the foreign body from the left eye.

## 2023-05-19 DIAGNOSIS — T1502XA Foreign body in cornea, left eye, initial encounter: Secondary | ICD-10-CM | POA: Diagnosis not present

## 2023-05-25 DIAGNOSIS — C44311 Basal cell carcinoma of skin of nose: Secondary | ICD-10-CM | POA: Diagnosis not present

## 2023-05-25 DIAGNOSIS — T1502XA Foreign body in cornea, left eye, initial encounter: Secondary | ICD-10-CM | POA: Diagnosis not present

## 2023-05-25 DIAGNOSIS — L980 Pyogenic granuloma: Secondary | ICD-10-CM | POA: Diagnosis not present

## 2023-05-25 DIAGNOSIS — L218 Other seborrheic dermatitis: Secondary | ICD-10-CM | POA: Diagnosis not present

## 2023-05-25 LAB — HM DIABETES EYE EXAM

## 2023-05-27 ENCOUNTER — Other Ambulatory Visit: Payer: Self-pay | Admitting: Family Medicine

## 2023-05-27 DIAGNOSIS — I2510 Atherosclerotic heart disease of native coronary artery without angina pectoris: Secondary | ICD-10-CM

## 2023-05-30 NOTE — Telephone Encounter (Signed)
Unable to refill per protocol, Rx request is too soon. Last refill 04/20/23 for 90 days.  Requested Prescriptions  Pending Prescriptions Disp Refills   ezetimibe (ZETIA) 10 MG tablet [Pharmacy Med Name: EZETIMIBE 10 MG TAB 10 Tablet] 90 tablet 0    Sig: TAKE 1 TABLET BY MOUTH DAILY     Cardiovascular:  Antilipid - Sterol Transport Inhibitors Failed - 05/27/2023  1:46 PM      Failed - Valid encounter within last 12 months    Recent Outpatient Visits           1 year ago Type 2 diabetes mellitus with hyperglycemia, without long-term current use of insulin (HCC)   [facility] Pickard, Priscille Heidelberg, MD   1 year ago Dysuria   [facility] Pickard, Priscille Heidelberg, MD   1 year ago Essential hypertension   [facility] Tanya Nones, Priscille Heidelberg, MD   1 year ago Mass of parotid gland   [facility] Donita Brooks, MD   2 years ago Dizziness and giddiness   [facility] Tanya Nones, Priscille Heidelberg, MD              Failed - Lipid Panel in normal range within the last 12 months    Cholesterol  Date Value Ref Range Status  11/15/2022 120 <200 mg/dL Final   LDL Cholesterol (Calc)  Date Value Ref Range Status  11/15/2022 46 mg/dL (calc) Final    Comment:    Reference range: <100 . Desirable range <100 mg/dL for primary prevention;   <70 mg/dL for patients with CHD or diabetic patients  with > or = 2 CHD risk factors. Marland Kitchen LDL-C is now calculated using the Martin-Hopkins  calculation, which is a validated novel method providing  better accuracy than the Friedewald equation in the  estimation of LDL-C.  Horald Pollen et al. Lenox Ahr. 6644;034(74): 2061-2068  (http://education.[facility].com/faq/FAQ164)    Direct LDL  Date Value Ref Range Status  05/28/2008 152.6 mg/dL Final    Comment:    See lab report for associated comment(s)   HDL  Date Value Ref Range Status  11/15/2022 53 > OR = 40 mg/dL Final   Triglycerides  Date  Value Ref Range Status  11/15/2022 128 <150 mg/dL Final         Passed - AST in normal range and within 360 days    AST  Date Value Ref Range Status  05/04/2023 19 0 - 40 IU/L Final         Passed - ALT in normal range and within 360 days    ALT  Date Value Ref Range Status  05/04/2023 12 0 - 44 IU/L Final         Passed - Patient is not pregnant

## 2023-06-06 ENCOUNTER — Other Ambulatory Visit: Payer: Self-pay | Admitting: Family Medicine

## 2023-06-06 DIAGNOSIS — I2510 Atherosclerotic heart disease of native coronary artery without angina pectoris: Secondary | ICD-10-CM

## 2023-06-09 ENCOUNTER — Other Ambulatory Visit: Payer: Self-pay | Admitting: Family Medicine

## 2023-06-10 NOTE — Telephone Encounter (Signed)
Requested medication (s) are due for refill today: routing for approval  Requested medication (s) are on the active medication list: yes  Last refill:  03/16/23  Future visit scheduled: no  Notes to clinic:  Unable to refill per protocol, cannot delegate.      Requested Prescriptions  Pending Prescriptions Disp Refills   clonazePAM (KLONOPIN) 0.5 MG tablet [Pharmacy Med Name: CLONAZEPAM 0.5 MG TAB 0.5 Tablet] 60 tablet 1    Sig: TAKE 1/2 TABLET BY MOUTH 3 TIMES DAILY AS NEEDED FOR ANXIETY.     Not Delegated - Psychiatry: Anxiolytics/Hypnotics 2 Failed - 06/09/2023  5:46 PM      Failed - This refill cannot be delegated      Failed - Urine Drug Screen completed in last 360 days      Failed - Valid encounter within last 6 months    Recent Outpatient Visits           1 year ago Type 2 diabetes mellitus with hyperglycemia, without long-term current use of insulin (HCC)   [facility] Pickard, Priscille Heidelberg, MD   1 year ago Dysuria   [facility] Donita Brooks, MD   1 year ago Essential hypertension   [facility] Tanya Nones, Priscille Heidelberg, MD   1 year ago Mass of parotid gland   [facility] Donita Brooks, MD   2 years ago Dizziness and giddiness   [facility] Pickard, Priscille Heidelberg, MD              Passed - Patient is not pregnant

## 2023-06-11 ENCOUNTER — Other Ambulatory Visit: Payer: Self-pay | Admitting: Family Medicine

## 2023-06-13 ENCOUNTER — Encounter: Payer: Self-pay | Admitting: Family Medicine

## 2023-06-13 ENCOUNTER — Other Ambulatory Visit: Payer: Self-pay

## 2023-06-13 DIAGNOSIS — G8929 Other chronic pain: Secondary | ICD-10-CM

## 2023-06-13 DIAGNOSIS — F418 Other specified anxiety disorders: Secondary | ICD-10-CM

## 2023-06-13 NOTE — Telephone Encounter (Signed)
Requested Prescriptions  Pending Prescriptions Disp Refills   buPROPion (WELLBUTRIN XL) 150 MG 24 hr tablet [Pharmacy Med Name: BUPROPION HCL XL 150 MG TAB 150 Tablet] 90 tablet 0    Sig: TAKE 1 TABLET (150 MG TOTAL) BY MOUTH DAILY. NEED OFFICE VISIT FOR REFILLS     Psychiatry: Antidepressants - bupropion Failed - 06/11/2023 11:24 AM      Failed - Cr in normal range and within 360 days    Creat  Date Value Ref Range Status  11/15/2022 1.40 (H) 0.70 - 1.22 mg/dL Final   Creatinine, Ser  Date Value Ref Range Status  05/04/2023 1.28 (H) 0.76 - 1.27 mg/dL Final   Creatinine, Urine  Date Value Ref Range Status  03/19/2021 116 20 - 320 mg/dL Final         Failed - Valid encounter within last 6 months    Recent Outpatient Visits           1 year ago Type 2 diabetes mellitus with hyperglycemia, without long-term current use of insulin (HCC)   [facility] Pickard, Priscille Heidelberg, MD   1 year ago Dysuria   [facility] Donita Brooks, MD   1 year ago Essential hypertension   [facility] Donita Brooks, MD   1 year ago Mass of parotid gland   [facility] Donita Brooks, MD   2 years ago Dizziness and giddiness   [facility] Pickard, Priscille Heidelberg, MD              Passed - AST in normal range and within 360 days    AST  Date Value Ref Range Status  05/04/2023 19 0 - 40 IU/L Final         Passed - ALT in normal range and within 360 days    ALT  Date Value Ref Range Status  05/04/2023 12 0 - 44 IU/L Final         Passed - Completed PHQ-2 or PHQ-9 in the last 360 days      Passed - Last BP in normal range    BP Readings from Last 1 Encounters:  05/17/23 120/64

## 2023-06-14 MED ORDER — EZETIMIBE 10 MG PO TABS
10.0000 mg | ORAL_TABLET | Freq: Every day | ORAL | 1 refills | Status: DC
Start: 2023-06-14 — End: 2023-08-26

## 2023-06-25 ENCOUNTER — Other Ambulatory Visit: Payer: Self-pay | Admitting: Family Medicine

## 2023-06-25 DIAGNOSIS — I639 Cerebral infarction, unspecified: Secondary | ICD-10-CM

## 2023-06-25 DIAGNOSIS — I350 Nonrheumatic aortic (valve) stenosis: Secondary | ICD-10-CM

## 2023-06-27 NOTE — Telephone Encounter (Signed)
Requested medication (s) are due for refill today: yes   Requested medication (s) are on the active medication list: yes  Last refill:  Prozac: 10/08/22 #90 2 RF                  Plavix: 04/25/23 #30 1 RF  Future visit scheduled: no   Notes to clinic: was last seen in Jan. Lab work from July Out of range   Requested Prescriptions  Pending Prescriptions Disp Refills   FLUoxetine (PROZAC) 20 MG capsule [Pharmacy Med Name: FLUOXETINE HCL 20 MG CAP 20 Capsule] 90 capsule 2    Sig: TAKE 1 CAPSULE BY MOUTH EACH MORNING.     Psychiatry:  Antidepressants - SSRI Failed - 06/25/2023  8:57 AM      Failed - Valid encounter within last 6 months    Recent Outpatient Visits           1 year ago Type 2 diabetes mellitus with hyperglycemia, without long-term current use of insulin (HCC)   [facility] Pickard, Priscille Heidelberg, MD   1 year ago Dysuria   [facility] Pickard, Priscille Heidelberg, MD   1 year ago Essential hypertension   [facility] Tanya Nones, Priscille Heidelberg, MD   1 year ago Mass of parotid gland   [facility] Donita Brooks, MD   2 years ago Dizziness and giddiness   [facility] Pickard, Priscille Heidelberg, MD              Passed - Completed PHQ-2 or PHQ-9 in the last 360 days       clopidogrel (PLAVIX) 75 MG tablet [Pharmacy Med Name: CLOPIDOGREL 75 MG TABLET 75 Tablet] 30 tablet 1    Sig: TAKE 1 TABLET (75 MG TOTAL) BY MOUTH DAILY. *NEED APPOINTMENT WITH PRIMARY CARE PROVIDER FOR FURTHER REFILLS*     Hematology: Antiplatelets - clopidogrel Failed - 06/25/2023  8:57 AM      Failed - HGB in normal range and within 180 days    Hemoglobin  Date Value Ref Range Status  05/08/2023 12.6 (L) 13.0 - 17.7 g/dL Final         Failed - Cr in normal range and within 360 days    Creat  Date Value Ref Range Status  11/15/2022 1.40 (H) 0.70 - 1.22 mg/dL Final   Creatinine, Ser  Date Value Ref Range Status  05/04/2023 1.28 (H) 0.76  - 1.27 mg/dL Final   Creatinine, Urine  Date Value Ref Range Status  03/19/2021 116 20 - 320 mg/dL Final         Failed - Valid encounter within last 6 months    Recent Outpatient Visits           1 year ago Type 2 diabetes mellitus with hyperglycemia, without long-term current use of insulin (HCC)   [facility] Pickard, Priscille Heidelberg, MD   1 year ago Dysuria   [facility] Donita Brooks, MD   1 year ago Essential hypertension   [facility] Tanya Nones, Priscille Heidelberg, MD   1 year ago Mass of parotid gland   [facility] Medicine Donita Brooks, MD   2 years ago Dizziness and giddiness   [facility] Pickard, Priscille Heidelberg, MD              Passed - HCT in normal range and within 180 days  Hematocrit  Date Value Ref Range Status  05/08/2023 40.8 37.5 - 51.0 % Final         Passed - PLT in normal range and within 180 days    Platelets  Date Value Ref Range Status  05/08/2023 176 150 - 450 x10E3/uL Final

## 2023-07-01 ENCOUNTER — Other Ambulatory Visit: Payer: Self-pay

## 2023-07-01 DIAGNOSIS — I350 Nonrheumatic aortic (valve) stenosis: Secondary | ICD-10-CM

## 2023-07-01 DIAGNOSIS — C44219 Basal cell carcinoma of skin of left ear and external auricular canal: Secondary | ICD-10-CM | POA: Diagnosis not present

## 2023-07-01 DIAGNOSIS — I639 Cerebral infarction, unspecified: Secondary | ICD-10-CM

## 2023-07-01 MED ORDER — CLOPIDOGREL BISULFATE 75 MG PO TABS
ORAL_TABLET | ORAL | 1 refills | Status: DC
Start: 2023-07-01 — End: 2023-08-26

## 2023-07-01 NOTE — Telephone Encounter (Signed)
Patient's son [name] called to follow up on refills requested for  FLUoxetine (PROZAC) 20 MG capsule [[id number]]   clopidogrel (PLAVIX) 75 MG tablet   **LOV: 05/10/23** **Requesting for all scripts to be sent in for 6 months at a time where possible.  Pharmacy confirmed as:   [facility] - [place], [place] - [address] [place] [address] Phone: [phone]  Fax: [phone]   Please advise Darrell at [phone].

## 2023-07-06 ENCOUNTER — Other Ambulatory Visit: Payer: Self-pay

## 2023-07-06 ENCOUNTER — Encounter: Payer: Self-pay | Admitting: Family Medicine

## 2023-07-06 DIAGNOSIS — F418 Other specified anxiety disorders: Secondary | ICD-10-CM

## 2023-07-06 MED ORDER — FLUOXETINE HCL 20 MG PO CAPS
ORAL_CAPSULE | ORAL | 1 refills | Status: DC
Start: 2023-07-06 — End: 2023-08-26

## 2023-07-08 ENCOUNTER — Encounter: Payer: Self-pay | Admitting: Family Medicine

## 2023-07-24 DIAGNOSIS — Z23 Encounter for immunization: Secondary | ICD-10-CM | POA: Diagnosis not present

## 2023-07-29 DIAGNOSIS — Z08 Encounter for follow-up examination after completed treatment for malignant neoplasm: Secondary | ICD-10-CM | POA: Diagnosis not present

## 2023-07-29 DIAGNOSIS — Z85828 Personal history of other malignant neoplasm of skin: Secondary | ICD-10-CM | POA: Diagnosis not present

## 2023-07-29 DIAGNOSIS — C44219 Basal cell carcinoma of skin of left ear and external auricular canal: Secondary | ICD-10-CM | POA: Diagnosis not present

## 2023-08-06 ENCOUNTER — Other Ambulatory Visit: Payer: Self-pay | Admitting: Family Medicine

## 2023-08-06 DIAGNOSIS — I350 Nonrheumatic aortic (valve) stenosis: Secondary | ICD-10-CM

## 2023-08-06 DIAGNOSIS — I639 Cerebral infarction, unspecified: Secondary | ICD-10-CM

## 2023-08-08 NOTE — Telephone Encounter (Signed)
Requested medication (s) are due for refill today:   Yes  Requested medication (s) are on the active medication list:   Yes  Future visit scheduled:   Yes 11/8 with Dr. Tanya Nones   LOV 05/17/2023 for an acute issue.   11/15/2022 for check up   Last ordered: 07/01/2023 #30, 1 refill    Returned for provider review for refill prior to upcoming appt.  Courtesy supply given in Sept.  Requested Prescriptions  Pending Prescriptions Disp Refills   clopidogrel (PLAVIX) 75 MG tablet [Pharmacy Med Name: CLOPIDOGREL 75 MG TABLET 75 Tablet] 30 tablet 1    Sig: tAKE 1 TABLET (75 MG TOTAL) BY MOUTH DAILY. nEED APPOINTMENT WITH PRIMARY CARE PROVIDER FOR FURTHER REFILLS     Hematology: Antiplatelets - clopidogrel Failed - 08/06/2023  9:16 AM      Failed - HGB in normal range and within 180 days    Hemoglobin  Date Value Ref Range Status  05/08/2023 12.6 (L) 13.0 - 17.7 g/dL Final         Failed - Cr in normal range and within 360 days    Creat  Date Value Ref Range Status  11/15/2022 1.40 (H) 0.70 - 1.22 mg/dL Final   Creatinine, Ser  Date Value Ref Range Status  05/04/2023 1.28 (H) 0.76 - 1.27 mg/dL Final   Creatinine, Urine  Date Value Ref Range Status  03/19/2021 116 20 - 320 mg/dL Final         Failed - Valid encounter within last 6 months    Recent Outpatient Visits           1 year ago Type 2 diabetes mellitus with hyperglycemia, without long-term current use of insulin (HCC)   [facility] Pickard, Priscille Heidelberg, MD   1 year ago Dysuria   [facility] Medicine Pickard, Priscille Heidelberg, MD   1 year ago Essential hypertension   [facility] Tanya Nones, Priscille Heidelberg, MD   1 year ago Mass of parotid gland   [facility] Medicine Donita Brooks, MD   2 years ago Dizziness and giddiness   [facility] Medicine Tanya Nones, Priscille Heidelberg, MD       Future Appointments             In 2 weeks Tanya Nones, Priscille Heidelberg, MD  [facility],  [facility]            Passed - HCT in normal range and within 180 days    Hematocrit  Date Value Ref Range Status  05/08/2023 40.8 37.5 - 51.0 % Final         Passed - PLT in normal range and within 180 days    Platelets  Date Value Ref Range Status  05/08/2023 176 150 - 450 x10E3/uL Final         Rosita Fire

## 2023-08-26 ENCOUNTER — Ambulatory Visit (INDEPENDENT_AMBULATORY_CARE_PROVIDER_SITE_OTHER): Payer: Medicare Other | Admitting: Family Medicine

## 2023-08-26 ENCOUNTER — Encounter: Payer: Self-pay | Admitting: Family Medicine

## 2023-08-26 VITALS — BP 126/62 | HR 65 | Temp 97.9°F | Ht 70.0 in | Wt 177.0 lb

## 2023-08-26 DIAGNOSIS — F418 Other specified anxiety disorders: Secondary | ICD-10-CM | POA: Diagnosis not present

## 2023-08-26 DIAGNOSIS — M25512 Pain in left shoulder: Secondary | ICD-10-CM | POA: Diagnosis not present

## 2023-08-26 DIAGNOSIS — Z7984 Long term (current) use of oral hypoglycemic drugs: Secondary | ICD-10-CM

## 2023-08-26 DIAGNOSIS — G8929 Other chronic pain: Secondary | ICD-10-CM | POA: Diagnosis not present

## 2023-08-26 DIAGNOSIS — M25511 Pain in right shoulder: Secondary | ICD-10-CM | POA: Diagnosis not present

## 2023-08-26 DIAGNOSIS — N1831 Chronic kidney disease, stage 3a: Secondary | ICD-10-CM | POA: Diagnosis not present

## 2023-08-26 DIAGNOSIS — E1165 Type 2 diabetes mellitus with hyperglycemia: Secondary | ICD-10-CM | POA: Diagnosis not present

## 2023-08-26 DIAGNOSIS — I639 Cerebral infarction, unspecified: Secondary | ICD-10-CM | POA: Diagnosis not present

## 2023-08-26 DIAGNOSIS — I10 Essential (primary) hypertension: Secondary | ICD-10-CM | POA: Diagnosis not present

## 2023-08-26 DIAGNOSIS — I350 Nonrheumatic aortic (valve) stenosis: Secondary | ICD-10-CM | POA: Diagnosis not present

## 2023-08-26 DIAGNOSIS — I2510 Atherosclerotic heart disease of native coronary artery without angina pectoris: Secondary | ICD-10-CM | POA: Diagnosis not present

## 2023-08-26 DIAGNOSIS — E119 Type 2 diabetes mellitus without complications: Secondary | ICD-10-CM

## 2023-08-26 MED ORDER — METOPROLOL SUCCINATE ER 25 MG PO TB24: 12.5000 mg | ORAL_TABLET | Freq: Every day | ORAL | 3 refills | Status: AC

## 2023-08-26 MED ORDER — ROSUVASTATIN CALCIUM 20 MG PO TABS
20.0000 mg | ORAL_TABLET | Freq: Every day | ORAL | 3 refills | Status: AC
Start: 2023-08-26 — End: ?

## 2023-08-26 MED ORDER — VITAMIN B-12 1000 MCG PO TABS
1000.0000 ug | ORAL_TABLET | Freq: Every day | ORAL | 3 refills | Status: AC
Start: 2023-08-26 — End: ?

## 2023-08-26 MED ORDER — SITAGLIPTIN PHOSPHATE 100 MG PO TABS
100.0000 mg | ORAL_TABLET | Freq: Every day | ORAL | 3 refills | Status: DC
Start: 2023-08-26 — End: 2023-09-11

## 2023-08-26 MED ORDER — FLUOXETINE HCL 20 MG PO CAPS
ORAL_CAPSULE | ORAL | 1 refills | Status: AC
Start: 2023-08-26 — End: ?

## 2023-08-26 MED ORDER — VALSARTAN 160 MG PO TABS
160.0000 mg | ORAL_TABLET | Freq: Two times a day (BID) | ORAL | 3 refills | Status: AC
Start: 2023-08-26 — End: ?

## 2023-08-26 MED ORDER — TRIAMCINOLONE ACETONIDE 40 MG/ML IJ SUSP
80.0000 mg | Freq: Once | INTRAMUSCULAR | Status: AC
Start: 2023-08-26 — End: 2023-08-26
  Administered 2023-08-26: 80 mg via INTRA_ARTICULAR

## 2023-08-26 MED ORDER — AMLODIPINE BESYLATE 10 MG PO TABS: 10.0000 mg | ORAL_TABLET | Freq: Every day | ORAL | 3 refills | Status: AC

## 2023-08-26 MED ORDER — GABAPENTIN 300 MG PO CAPS: 300.0000 mg | ORAL_CAPSULE | Freq: Two times a day (BID) | ORAL | 3 refills | Status: AC

## 2023-08-26 MED ORDER — BUPROPION HCL ER (XL) 150 MG PO TB24: 150.0000 mg | ORAL_TABLET | Freq: Every day | ORAL | 3 refills | Status: AC

## 2023-08-26 MED ORDER — HYDRALAZINE HCL 100 MG PO TABS: 100.0000 mg | ORAL_TABLET | Freq: Three times a day (TID) | ORAL | 9 refills | Status: AC

## 2023-08-26 MED ORDER — GLIPIZIDE ER 10 MG PO TB24
10.0000 mg | ORAL_TABLET | Freq: Every day | ORAL | 3 refills | Status: AC
Start: 2023-08-26 — End: ?

## 2023-08-26 MED ORDER — PIOGLITAZONE HCL 30 MG PO TABS: 30.0000 mg | ORAL_TABLET | Freq: Every day | ORAL | 3 refills | Status: AC

## 2023-08-26 MED ORDER — EZETIMIBE 10 MG PO TABS
10.0000 mg | ORAL_TABLET | Freq: Every day | ORAL | 3 refills | Status: AC
Start: 2023-08-26 — End: ?

## 2023-08-26 MED ORDER — CLOPIDOGREL BISULFATE 75 MG PO TABS
75.0000 mg | ORAL_TABLET | Freq: Every day | ORAL | 3 refills | Status: AC
Start: 2023-08-26 — End: ?

## 2023-08-26 NOTE — Addendum Note (Signed)
Addended by: Venia Carbon K on: 08/26/2023 11:01 AM   Modules accepted: Orders

## 2023-08-26 NOTE — Progress Notes (Signed)
Subjective:    Patient ID: Barry [name], male    DOB: November 23, 1932, 89 y.o.   MRN: [id number]  HPI  Patient is a very pleasant 89 year old Caucasian gentleman with a history of stroke in 2022, coronary artery disease, hypertension, hyperlipidemia, and type 2 diabetes mellitus.  He suffers from chronic vertigo every time he stands up.  He also has decreased dexterity in his left upper extremity from stroke.  Due to his frail condition, we have not pursued further treatment for AAA.  He also has a history of moderate aortic stenosis as well as carotid artery disease however we have elected not to screen for this with echocardiograms or ultrasound as the patient is a high risk surgical candidate.  Patient continued to have bilateral shoulder pain.  He is requesting a cortisone injection in both shoulders today.  Otherwise he is doing well.  He is due for an RSV vaccine.  He is due for shingles vaccine.  He denies any chest pain.  He denies any shortness of breath.  His blood pressure today is well-controlled.  His son reports that his sugars are typically between 100-130.  They seldom see his blood sugar over 200, perhaps twice a month.  Patient denies any syncope or presyncope.  He does have a history of aortic stenosis.  He has a 3/6 systolic ejection murmur heard today on exam but he is asymptomatic and declines any further screening. Past Medical History:  Diagnosis Date   AAA (abdominal aortic aneurysm) (HCC) 12/17/2007   4.7 cm   Allergy    rhinitis   Anxiety    Cancer (HCC)    skin   Cataract    Depression    Diabetes mellitus    Diverticulosis    GERD (gastroesophageal reflux disease)    Hepatitis    Hx of colonic polyps 08/26/2015   Hyperlipidemia    Hypertension    Nephrolithiasis    Personal history of colonic polyps    adenomas, serrated also   PVD (peripheral vascular disease) (HCC)    Stroke (HCC)    Tobacco abuse    Past Surgical History:  Procedure Laterality Date    COLONOSCOPY  2006, 2008, 2010, 05/27/2011   numerous adenomas - 13 in 2006, 3, 2008, 2 2012 (up to 1 cm), 4 diminutive adenomas, serrated adenomas 2012. Diverticulosis and hemorrhoids also.   EYE SURGERY     EYE SURGERY Left    Kidney stone operation     lower limb amputation, other toe 5th     percutaneous stent graft repair of infrarenal AAA  11/10   5.6 cm (T. early)   surgical excision basal cell carcinoma     TEE WITHOUT CARDIOVERSION N/A 07/29/2021   Procedure: TRANSESOPHAGEAL ECHOCARDIOGRAM (TEE);  Surgeon: Sande Rives, MD;  Location: [facility];  Service: Cardiovascular;  Laterality: N/A;   tympanic eardrum repair     VASECTOMY     Current Outpatient Medications on File Prior to Visit  Medication Sig Dispense Refill   acetaminophen (TYLENOL) 325 MG tablet Take 2 tablets (650 mg total) by mouth every 6 (six) hours as needed for mild pain (or Fever >/= 101).     amLODipine (NORVASC) 10 MG tablet TAKE 1 TABLET (10 MG TOTAL) BY MOUTH DAILY. 90 tablet 1   artificial tears (LACRILUBE) OINT ophthalmic ointment Place into both eyes every 4 (four) hours as needed for dry eyes.     buPROPion (WELLBUTRIN XL) 150 MG 24 hr tablet TAKE  1 TABLET (150 MG TOTAL) BY MOUTH DAILY. NEED OFFICE VISIT FOR REFILLS 90 tablet 0   cephALEXin (KEFLEX) 500 MG capsule Take 1 capsule (500 mg total) by mouth 3 (three) times daily. (Patient not taking: Reported on 05/17/2023) 21 capsule 0   clonazePAM (KLONOPIN) 0.5 MG tablet TAKE 1/2 TABLET BY MOUTH 3 TIMES DAILY AS NEEDED FOR ANXIETY. 60 tablet 1   clopidogrel (PLAVIX) 75 MG tablet tAKE 1 TABLET (75 MG TOTAL) BY MOUTH DAILY. nEED APPOINTMENT WITH PRIMARY CARE PROVIDER FOR FURTHER REFILLS 30 tablet 1   ezetimibe (ZETIA) 10 MG tablet Take 1 tablet (10 mg total) by mouth daily. 90 tablet 1   FLUoxetine (PROZAC) 20 MG capsule TAKE (1) CAPSULE BY MOUTH EACH MORNING. 90 capsule 1   fluticasone (FLONASE) 50 MCG/ACT nasal spray Place 2 sprays into both nostrils  daily. 16 g 6   gabapentin (NEURONTIN) 300 MG capsule TAKE 1 CAPSULE BY MOUTH 2 TIMES DAILY AS NEEDED (NERVE PAIN). 60 capsule 3   glipiZIDE (GLIPIZIDE XL) 10 MG 24 hr tablet Take 1 tablet (10 mg total) by mouth daily with breakfast. 30 tablet 30   hydrALAZINE (APRESOLINE) 100 MG tablet TAKE 1 TABLET BY MOUTH EVERY 8 HOURS. 90 tablet 7   metoprolol succinate (TOPROL-XL) 25 MG 24 hr tablet TAKE 1/2 TABLET BY MOUTH DAILY. 45 tablet 3   montelukast (SINGULAIR) 10 MG tablet TAKE 1 TABLET (10 MG TOTAL) BY MOUTH AT BEDTIME. 90 tablet 1   pioglitazone (ACTOS) 30 MG tablet Take 1 tablet (30 mg total) by mouth daily. 30 tablet 3   RESTASIS 0.05 % ophthalmic emulsion 1 drop 2 (two) times daily.     rosuvastatin (CRESTOR) 20 MG tablet TAKE 1 TABLET BY MOUTH DAILY. 90 tablet 3   sitaGLIPtin (JANUVIA) 100 MG tablet Take 1 tablet (100 mg total) by mouth daily. 90 tablet 0   trimethoprim-polymyxin b (POLYTRIM) ophthalmic solution Place 2 drops into the left eye every 4 (four) hours. 10 mL 0   valsartan (DIOVAN) 160 MG tablet TAKE 1 TABLET BY MOUTH 2 TIMES DAILY. 180 tablet 3   vitamin B-12 (CYANOCOBALAMIN) 1000 MCG tablet Take 1,000 mcg by mouth daily.     No current facility-administered medications on file prior to visit.     Allergies  Allergen Reactions   Lisinopril-Hydrochlorothiazide Swelling    ZESTRIL   Doxycycline Rash   Social History   Socioeconomic History   Marital status: Widowed    Spouse name: Not on file   Number of children: 2   Years of education: Not on file   Highest education level: Not on file  Occupational History    Employer: RETIRED  Tobacco Use   Smoking status: Former    Current packs/day: 1.00    Average packs/day: 1 pack/day for 60.0 years (60.0 ttl pk-yrs)    Types: Cigarettes   Smokeless tobacco: Never   Tobacco comments:    Hasn't smoked since he had his stroke   Vaping Use   Vaping status: Never Used  Substance and Sexual Activity   Alcohol use: No    Drug use: No   Sexual activity: Yes    Partners: Female    Birth control/protection: Surgical    Comment: vasectomy  Other Topics Concern   Not on file  Social History Narrative   11/11/21 living with son, Barry [name]   HSG, 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 Lives alone. 1- step-daughter, 1 son - '68; 3 grandchildren.  does odd-jobs, yard work   Chemical engineer Strain: Low Risk  (01/06/2023)   Overall Financial Resource Strain (CARDIA)    Difficulty of Paying Living Expenses: Not hard at all  Food Insecurity: No Food Insecurity (01/06/2023)   Hunger Vital Sign    Worried About Running Out of Food in the Last Year: Never true    Ran Out of Food in the Last Year: Never true  Transportation Needs: No Transportation Needs (01/06/2023)   PRAPARE - Administrator, Civil Service (Medical): No    Lack of Transportation (Non-Medical): No  Physical Activity: Inactive (01/06/2023)   Exercise Vital Sign    Days of Exercise per Week: 0 days    Minutes of Exercise per Session: 0 min  Stress: No Stress Concern Present (01/06/2023)   [employer] of Occupational Health - Occupational Stress Questionnaire    Feeling of Stress : Only a little  Social Connections: Moderately Isolated (01/06/2023)   Social Connection and Isolation Panel [NHANES]    Frequency of Communication with Friends and Family: More than three times a week    Frequency of Social Gatherings with Friends and Family: More than three times a week    Attends Religious Services: 1 to 4 times per year    Active Member of [employer] or Organizations: No    Attends Banker Meetings: Never    Marital Status: Widowed  Intimate Partner Violence: Not At Risk (01/06/2023)   Humiliation, Afraid, Rape, and Kick questionnaire    Fear of Current or Ex-Partner: No    Emotionally Abused: No    Physically Abused: No    Sexually Abused: No     Review of  Systems  All other systems reviewed and are negative.      Objective:   Physical Exam Vitals reviewed.  Constitutional:      General: He is not in acute distress.    Appearance: He is not toxic-appearing.  Cardiovascular:     Rate and Rhythm: Normal rate.     Heart sounds: Murmur heard.  Pulmonary:     Effort: Pulmonary effort is normal.     Breath sounds: Normal breath sounds. No wheezing.  Musculoskeletal:     Right lower leg: No edema.     Left lower leg: No edema.  Neurological:     General: No focal deficit present.     Mental Status: He is alert and oriented to person, place, and time.     Cranial Nerves: No cranial nerve deficit.     Sensory: No sensory deficit.     Coordination: Coordination normal.     Gait: Gait abnormal.     Deep Tendon Reflexes: Reflexes normal.           Assessment & Plan:  Type 2 diabetes mellitus with hyperglycemia, without long-term current use of insulin (HCC) - Plan: Hemoglobin A1c, CBC with Differential/Platelet, COMPLETE METABOLIC PANEL WITH GFR, Lipid panel  Cerebrovascular accident (CVA), unspecified mechanism (HCC)  Aortic valve stenosis, etiology of cardiac valve disease unspecified  Coronary artery disease involving native coronary artery of native heart without angina pectoris  Essential hypertension  Chronic pain of both shoulders Blood pressure is outstanding today.  I recommended an RSV shot as well as a shingles vaccine.  Patient defers these at the present time.  Due to age I would not recommend any prostate cancer screening colon cancer screening.  We discussed an echocardiogram to evaluate for worsening  aortic stenosis however patient declines this at the present time.  He would like to get cortisone injections in both shoulders due to the pain.  I am happy to do that today.  I would also check a CBC CMP lipid panel and an A1c.  I like to see his A1c less than 7 and his LDL cholesterol less than 70.  His blood pressure  is well below 130/80 which is his goal.  Using sterile technique, I injected the left shoulder with 2 cc of lidocaine, 2 cc Marcaine, and 2 cc of 40 g mL Kenalog.  The patient tolerated the procedure well.  I then repeated the exact same injection in the right shoulder using 2 cc of Marcaine, 2 cc of lidocaine, and 2 cc of 40 mg/mL Kenalog.  Patient tolerated this procedure as well.

## 2023-08-26 NOTE — Addendum Note (Signed)
Addended by: Venia Carbon K on: 08/26/2023 01:06 PM   Modules accepted: Orders

## 2023-08-27 LAB — COMPLETE METABOLIC PANEL WITH GFR
AG Ratio: 1.5 (calc) (ref 1.0–2.5)
ALT: 8 U/L — ABNORMAL LOW (ref 9–46)
AST: 15 U/L (ref 10–35)
Albumin: 3.5 g/dL — ABNORMAL LOW (ref 3.6–5.1)
Alkaline phosphatase (APISO): 34 U/L — ABNORMAL LOW (ref 35–144)
BUN/Creatinine Ratio: 27 (calc) — ABNORMAL HIGH (ref 6–22)
BUN: 35 mg/dL — ABNORMAL HIGH (ref 7–25)
CO2: 27 mmol/L (ref 20–32)
Calcium: 9 mg/dL (ref 8.6–10.3)
Chloride: 106 mmol/L (ref 98–110)
Creat: 1.32 mg/dL — ABNORMAL HIGH (ref 0.70–1.22)
Globulin: 2.3 g/dL (ref 1.9–3.7)
Glucose, Bld: 146 mg/dL — ABNORMAL HIGH (ref 65–99)
Potassium: 4.4 mmol/L (ref 3.5–5.3)
Sodium: 141 mmol/L (ref 135–146)
Total Bilirubin: 0.3 mg/dL (ref 0.2–1.2)
Total Protein: 5.8 g/dL — ABNORMAL LOW (ref 6.1–8.1)
eGFR: 52 mL/min/{1.73_m2} — ABNORMAL LOW (ref >=60)

## 2023-08-27 LAB — CBC WITH DIFFERENTIAL/PLATELET
Absolute Lymphocytes: 1326 {cells}/uL (ref 850–3900)
Absolute Monocytes: 668 {cells}/uL (ref 200–950)
Basophils Absolute: 51 {cells}/uL (ref 0–200)
Basophils Relative: 1 %
Eosinophils Absolute: 128 {cells}/uL (ref 15–500)
Eosinophils Relative: 2.5 %
HCT: 37.6 % — ABNORMAL LOW (ref 38.5–50.0)
Hemoglobin: 12.1 g/dL — ABNORMAL LOW (ref 13.2–17.1)
MCH: 31.9 pg (ref 27.0–33.0)
MCHC: 32.2 g/dL (ref 32.0–36.0)
MCV: 99.2 fL (ref 80.0–100.0)
MPV: 11 fL (ref 7.5–12.5)
Monocytes Relative: 13.1 %
Neutro Abs: 2927 {cells}/uL (ref 1500–7800)
Neutrophils Relative %: 57.4 %
Platelets: 183 10*3/uL (ref 140–400)
RBC: 3.79 10*6/uL — ABNORMAL LOW (ref 4.20–5.80)
RDW: 14.6 % (ref 11.0–15.0)
Total Lymphocyte: 26 %
WBC: 5.1 10*3/uL (ref 3.8–10.8)

## 2023-08-27 LAB — HEMOGLOBIN A1C
Hgb A1c MFr Bld: 7 %{Hb} — ABNORMAL HIGH (ref <5.7)
Mean Plasma Glucose: 154 mg/dL
eAG (mmol/L): 8.5 mmol/L

## 2023-08-27 LAB — LIPID PANEL
Cholesterol: 127 mg/dL (ref <200)
HDL: 51 mg/dL (ref >=40)
LDL Cholesterol (Calc): 55 mg/dL
Non-HDL Cholesterol (Calc): 76 mg/dL (ref <130)
Total CHOL/HDL Ratio: 2.5 (calc) (ref <5.0)
Triglycerides: 124 mg/dL (ref <150)

## 2023-09-01 IMAGING — CT CT ANGIO HEAD-NECK (W OR W/O PERF)
3 of 7 series · 10 of 36 positions shown · IV contrast (Omnipaque or Isovue)
Comparison: Same-day noncontrast CT head, MRA head/neck 03/13/2021

CLINICAL DATA: Left-sided weakness

EXAM:
CT ANGIOGRAPHY HEAD AND NECK
TECHNIQUE: Multidetector CT imaging of the head and neck was performed using
the standard protocol during bolus administration of intravenous
contrast. Multiplanar CT image reconstructions and MIPs were
obtained to evaluate the vascular anatomy. Carotid stenosis
measurements (when applicable) are obtained utilizing NASCET
criteria, using the distal internal carotid diameter as the
denominator.
CONTRAST:  75mL OMNIPAQUE IOHEXOL 350 MG/ML SOLN

[Series 5: cta head & neck · axial · 0.58mm/px · z∈[-178,-58]mm · 2 of 180 slices shown]
[im 60/180  soft-tissue]
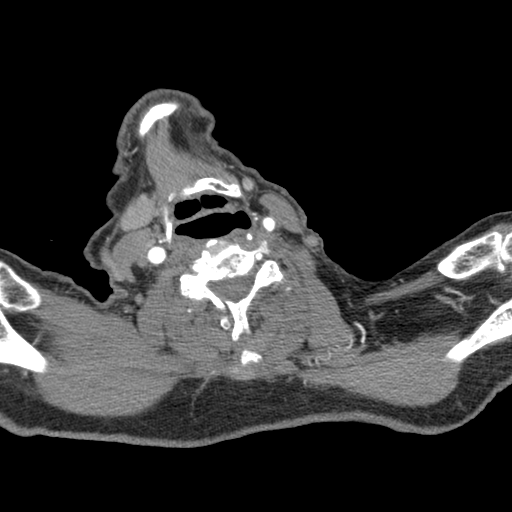
[im 120/180  soft-tissue]
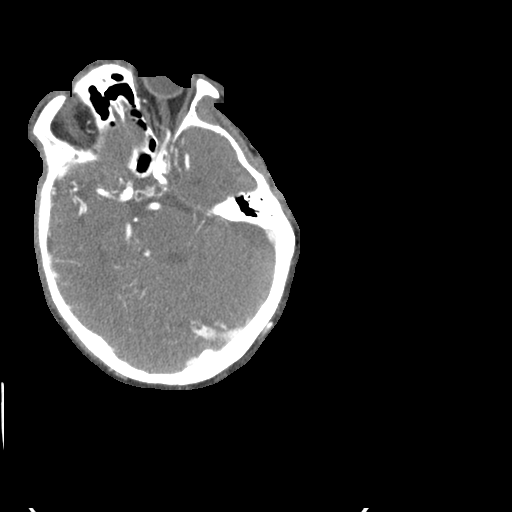

[Series 7: ax thins · axial · 0.43mm/px · z∈[-294,-31]mm · 6 of 380 slices shown]
[im 55/380  soft-tissue]
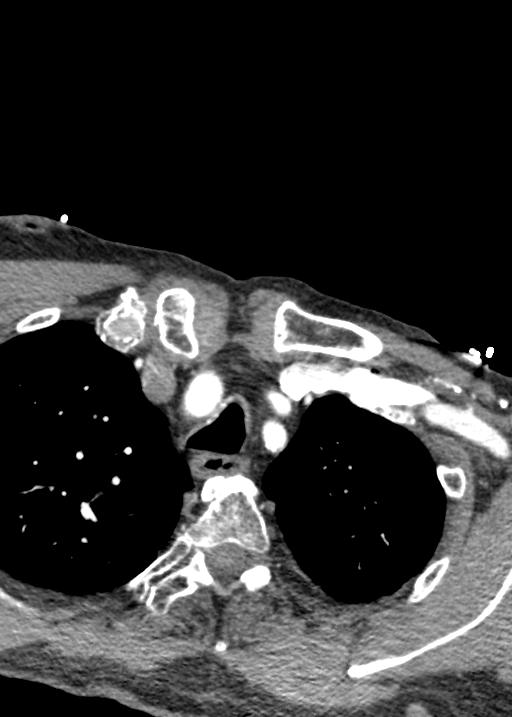
[im 109/380  bone]
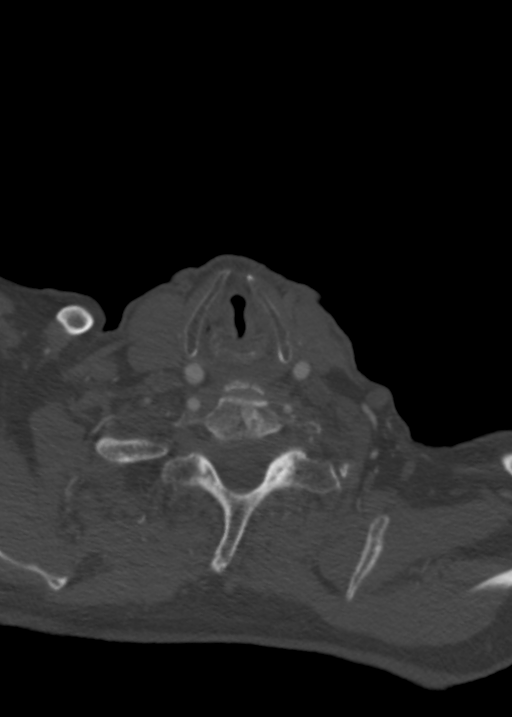
[im 163/380  soft-tissue]
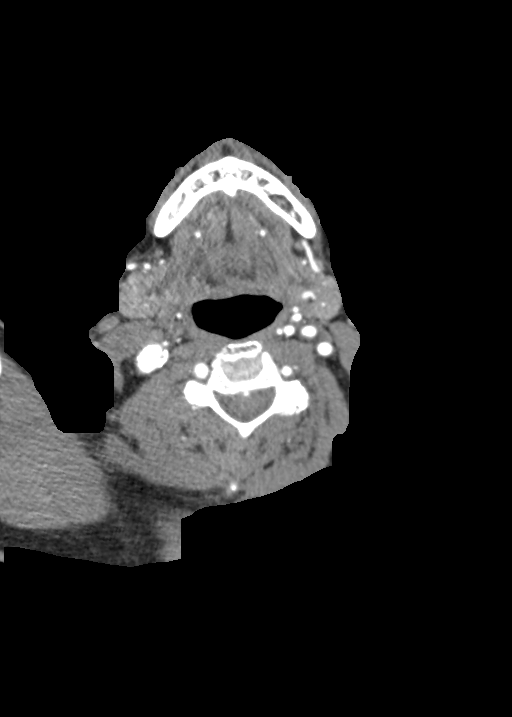
[im 217/380  bone]
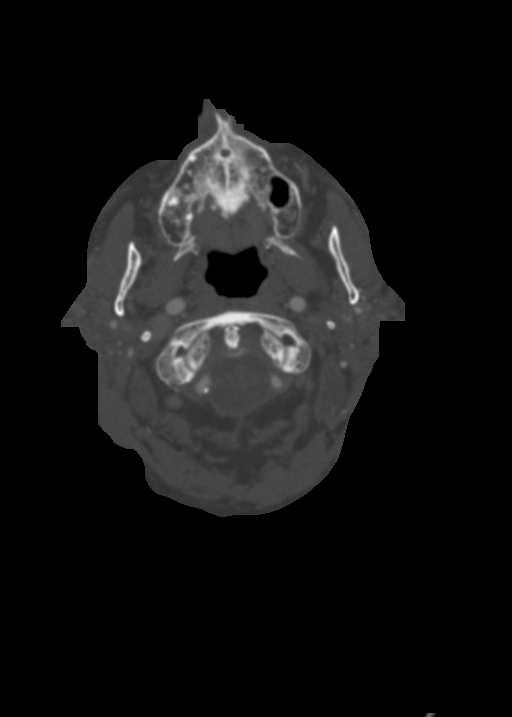
[im 271/380  soft-tissue]
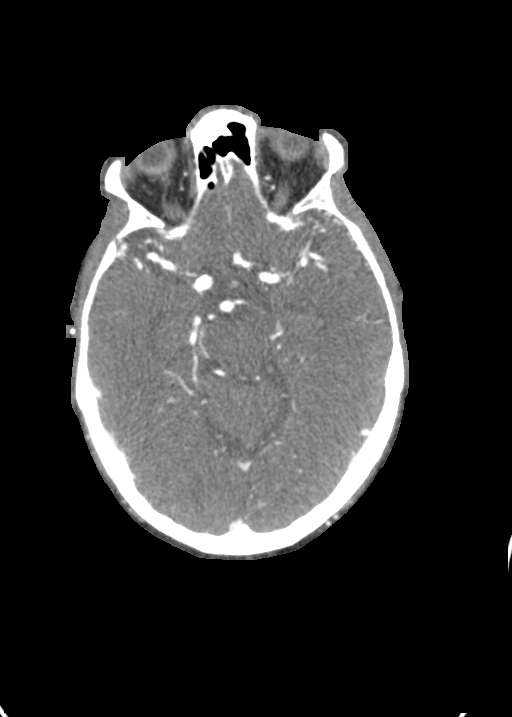
[im 325/380  bone]
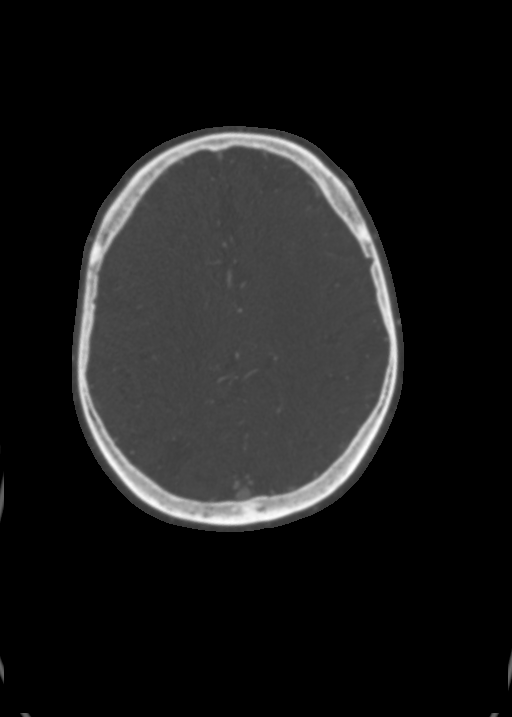

[Series 9: sag thin · sagittal · 0.60mm/px · 2 of 201 slices shown]
[im 77/201  soft-tissue]
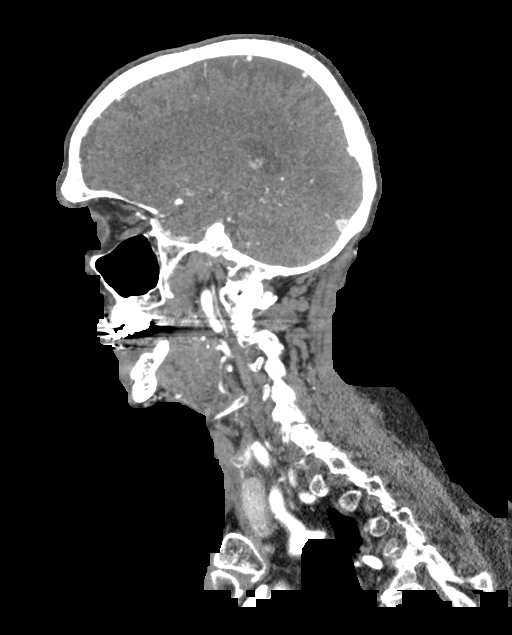
[im 125/201  soft-tissue]
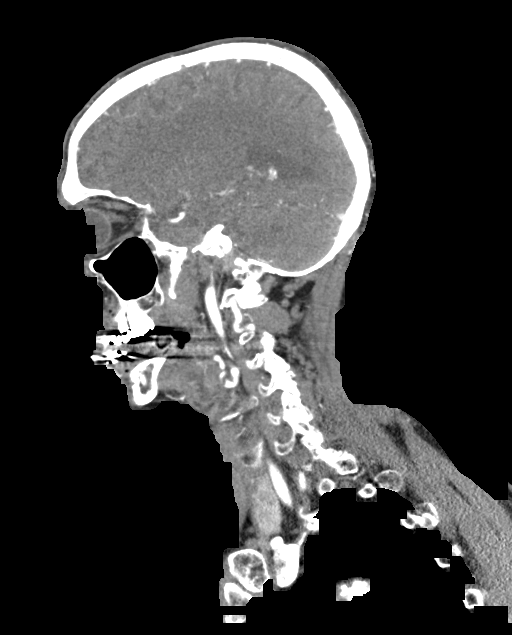

[10 of 36 positions shown; findings below may reference images not displayed]

FINDINGS: CTA NECK FINDINGS

Aortic arch: There is calcified atherosclerotic plaque of the aortic
arch and in the proximal great vessels. There is multifocal
irregularity of the right subclavian artery without high-grade
stenosis or occlusion. There is no evidence of dissection or
aneurysm.

Right carotid system: There is scattered calcified atherosclerotic
plaque in the right common carotid artery without hemodynamically
significant stenosis. There is soft and calcified atherosclerotic
plaque of the proximal right internal carotid artery resulting in up
to 30-40% stenosis. There is no evidence of dissection or aneurysm.

Left carotid system: There is mild calcified atherosclerotic plaque
in the left common carotid artery without hemodynamically
significant stenosis. There is mild calcified atherosclerotic plaque
in the proximal left internal carotid artery without hemodynamically
significant stenosis or occlusion. There is no dissection or
aneurysm.

Vertebral arteries: There is mild calcified atherosclerotic plaque
in the proximal right vertebral artery and at the V2/V3 junction
without hemodynamically significant stenosis or occlusion. The
remainder of the right vertebral artery is patent. There is no
dissection or aneurysm. The left vertebral artery is2 smaller than
the right, a normal variant. There is no hemodynamically significant
stenosis or occlusion. There is no dissection or aneurysm.

Skeleton: There is multilevel degenerative change of the cervical
spine, most advanced at C5-C6.

Other neck: There is a subcentimeter left thyroid nodule. Small
bilateral palatine tonsilliths are noted. There is a 1.0 cm
enhancing lesion in the right parotid gland.

Upper chest: There is emphysema in the lung apices. There is
cavitation in the medial left upper lobe, incompletely imaged but
also present on the prior CT chest from 12/21/2018.

Review of the MIP images confirms the above findings

CTA HEAD FINDINGS

Anterior circulation: There is calcified atherosclerotic plaque of
the bilateral intracranial carotid arteries resulting in mild to
moderate stenosis on the right and mild stenosis on the left,
similar to the prior study. There is no evidence of occlusion. The
bilateral MCAs are patent.

The bilateral ACAs are patent.

There is no aneurysm.

Posterior circulation: There is mild calcified atherosclerotic
plaque of the right V4 segment without hemodynamically significant
stenosis or occlusion. The left V4 segment is patent. PICA is patent
bilaterally.

There is mild focal stenosis of the the proximal left P2 segment
(8-117), similar to the prior study. Basilar artery is patent. The
bilateral PCAs are patent. There is no aneurysm.

Venous sinuses: Patent.

Anatomic variants: None.

Review of the MIP images confirms the above findings
IMPRESSION: 1. Mild atherosclerotic plaque in the bilateral carotid bulbs
resulting in up to 30-40% stenosis on the right. Patent vertebral
arteries.
2. Calcified atherosclerotic plaque of the bilateral intracranial
ICAs resulting in mild to moderate stenosis on the right and mild
stenosis on the left, not significantly changed.
3. Mild focal stenosis of the proximal left P2 segment. Otherwise,
patent intracranial vasculature.
4. 1.0 cm enhancing lesion in the right parotid gland is
indeterminate with differential including both benign and malignant
etiologies. Recommend ENT referral.

Aortic Atherosclerosis (VBYLJ-SYZ.Z) and Emphysema (VBYLJ-SJL.Q).

## 2023-09-01 IMAGING — CT CT HEAD CODE STROKE
3 series · 15 of 47 positions shown, 18 images · non-contrast
Comparison: CT head 10/27/2018, brain MRI 03/13/2021

CLINICAL DATA: Code stroke. Left arm weakness starting at [DATE] this
morning

EXAM:
CT HEAD WITHOUT CONTRAST
TECHNIQUE: Contiguous axial images were obtained from the base of the skull
through the vertex without intravenous contrast.

[Series 2: head w o · axial · 0.47mm/px · z∈[-146,-6]mm · 9 of 34 slices shown, 12 images]
[im 3/34  brain]
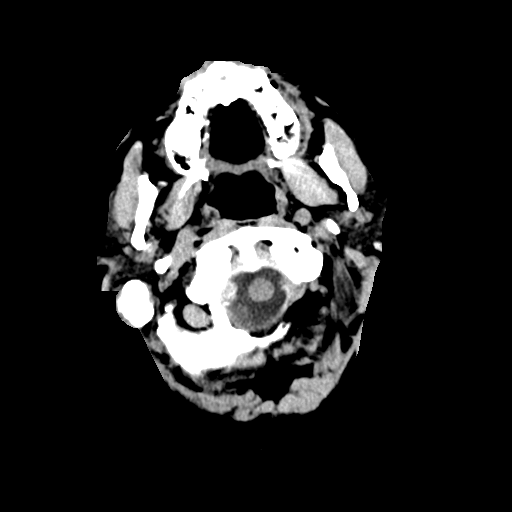
[im 3/34  bone]
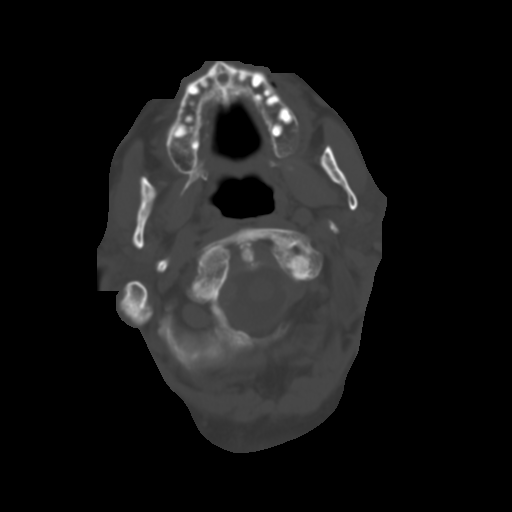
[im 6/34  brain]
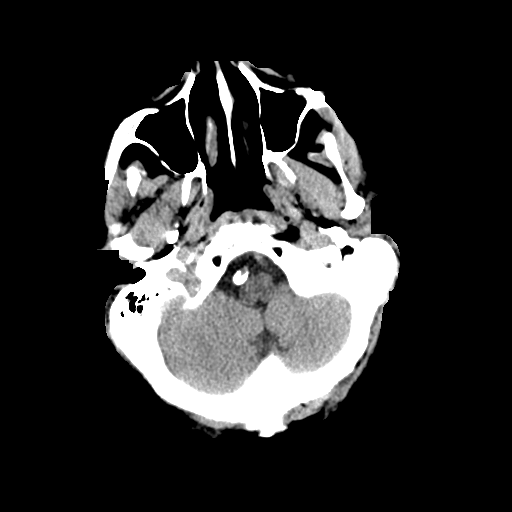
[im 10/34  brain]
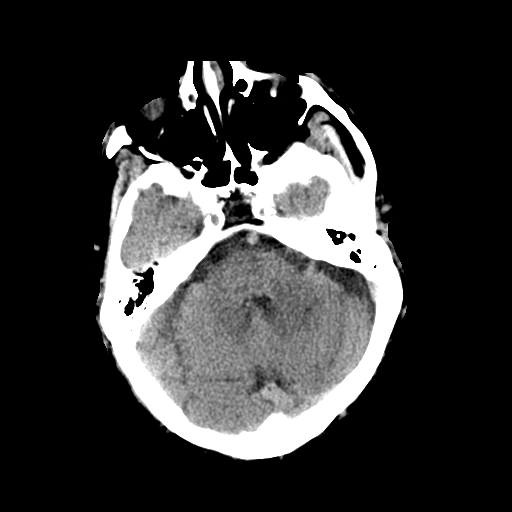
[im 13/34  brain]
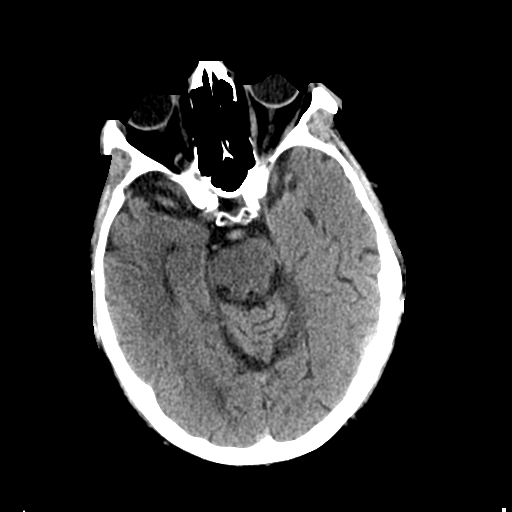
[im 18/34  brain]
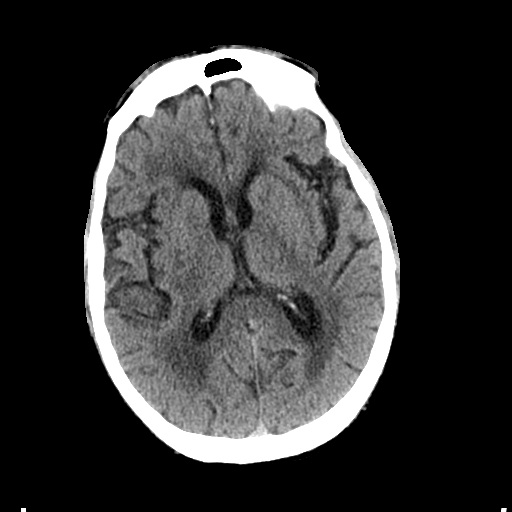
[im 18/34  bone]
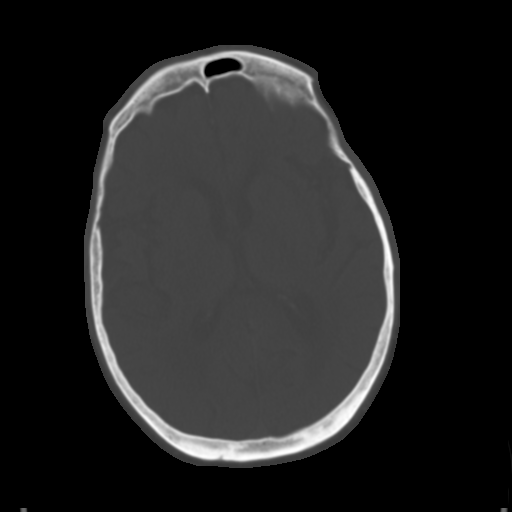
[im 21/34  brain]
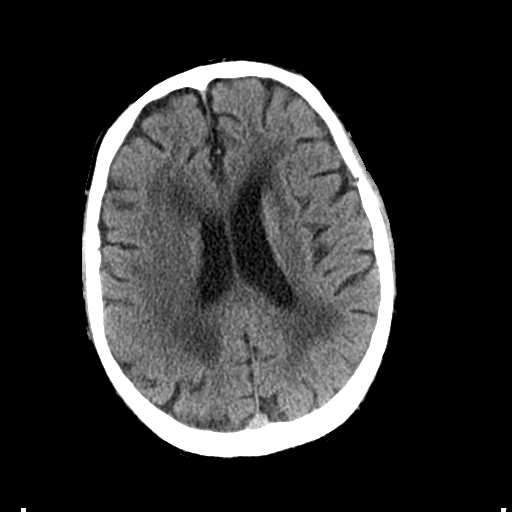
[im 24/34  brain]
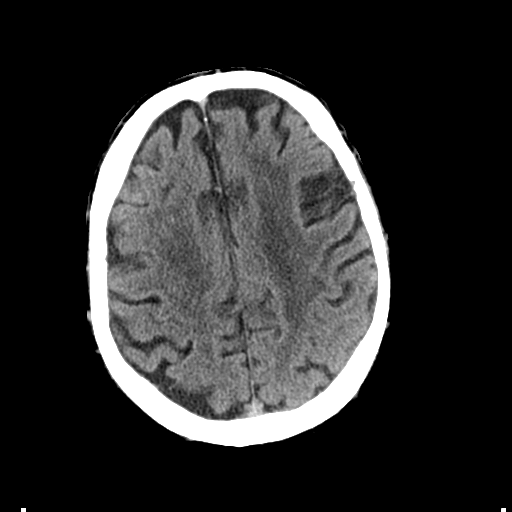
[im 28/34  brain]
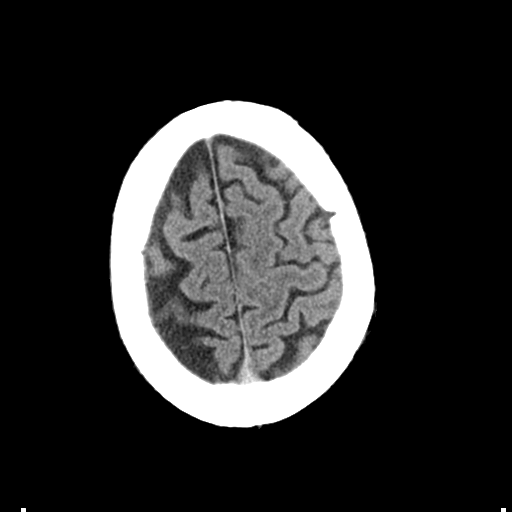
[im 31/34  brain]
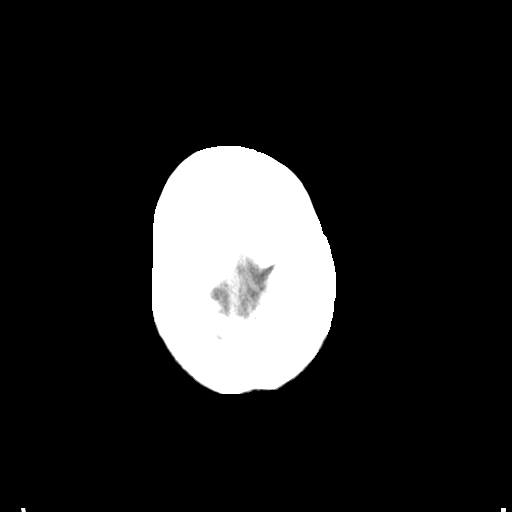
[im 31/34  bone]
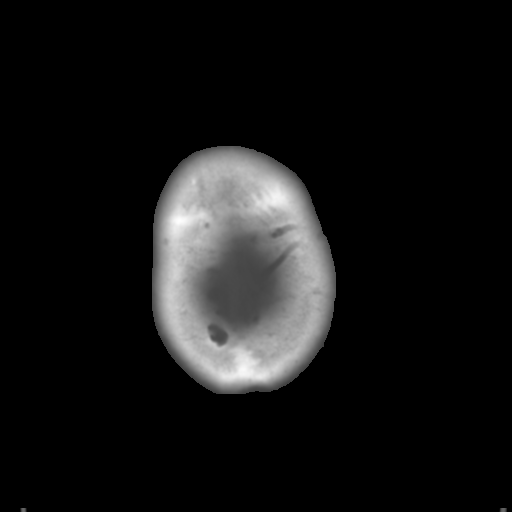

[Series 4: coronal soft · coronal · 0.34mm/px · 3 of 74 slices shown]
[im 25/74  brain]
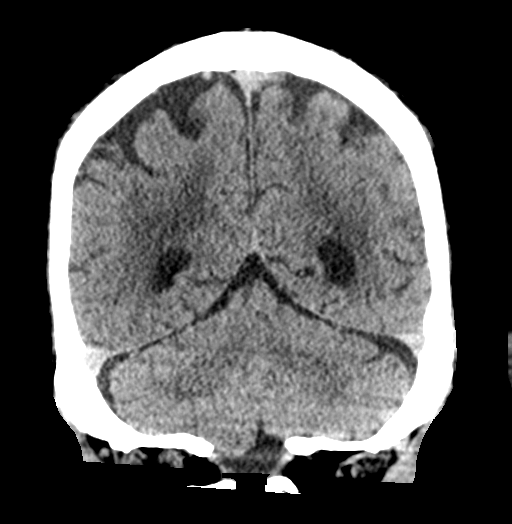
[im 33/74  brain]
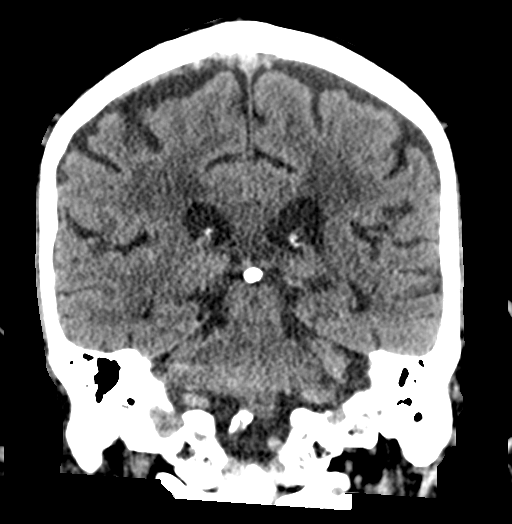
[im 41/74  brain]
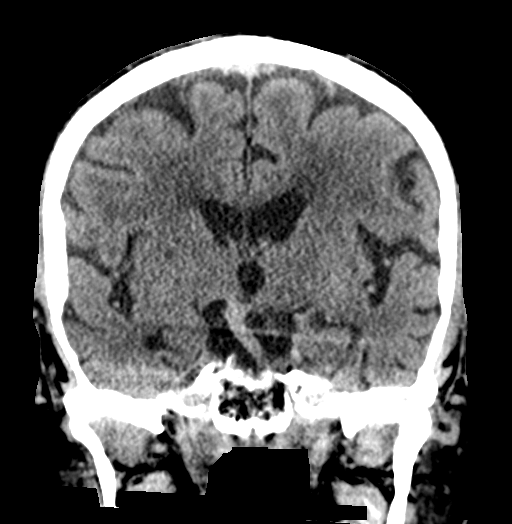

[Series 5: sagittal soft · sagittal · 0.37mm/px · 3 of 61 slices shown]
[im 21/61  brain]
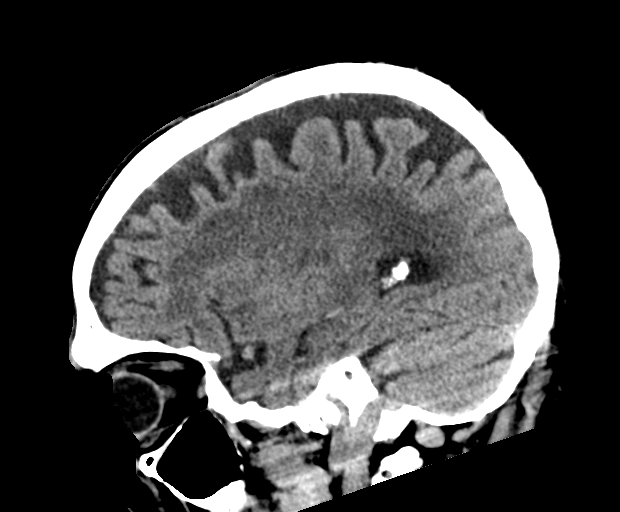
[im 31/61  brain]
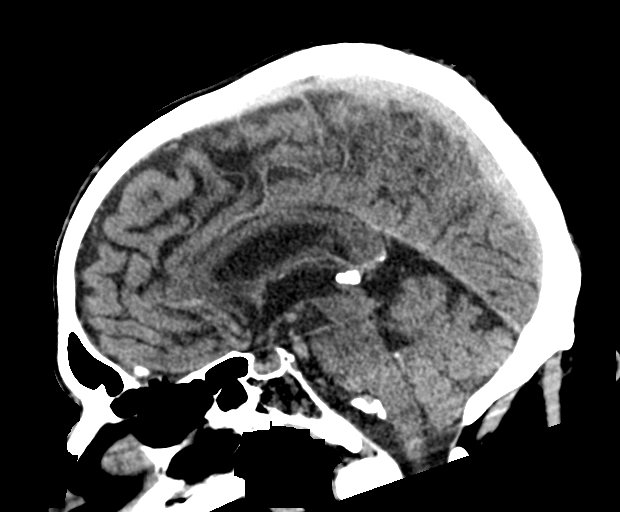
[im 41/61  brain]
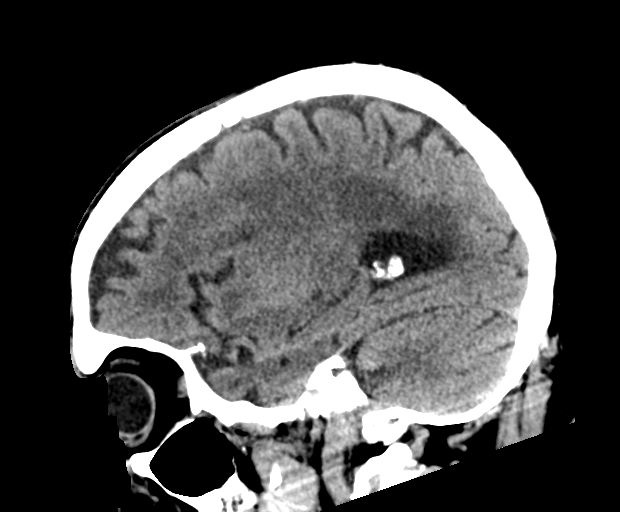

[15 of 47 positions shown; findings below may reference images not displayed]

FINDINGS: Brain: There is no evidence of acute intracranial hemorrhage,
extra-axial fluid collection, or acute infarct.

There is mild parenchymal volume loss. Confluent hypodensity
throughout the subcortical and periventricular white matter likely
reflects sequela of advanced chronic white matter microangiopathy.
The ventricles are stable in size.

There is no mass lesion.  There is no midline shift.

Vascular: There is calcification of the bilateral cavernous ICAs and
vertebral arteries. There is no dense vessel.

Skull: Normal. Negative for fracture or focal lesion.

Sinuses/Orbits: The paranasal sinuses are clear. Bilateral lens
implants are in place. The globes and orbits are otherwise
unremarkable.

Other: A left mastoid effusion is again seen.

ASPECTS (Alberta Stroke Program Early CT Score)

- Ganglionic level infarction (caudate, lentiform nuclei, internal
capsule, insula, M1-M3 cortex): 7

- Supraganglionic infarction (M4-M6 cortex): 3

Total score (0-10 with 10 being normal): 10
IMPRESSION: 1. No acute intracranial pathology.
2. ASPECTS is 10
3. Mild parenchymal volume loss and advanced chronic white matter
microangiopathy.

These results were called by telephone at the time of interpretation
on 07/27/2021 at [DATE] to provider JABUNIA GIO , who verbally
acknowledged these results.

## 2023-09-06 ENCOUNTER — Other Ambulatory Visit: Payer: Self-pay | Admitting: Family Medicine

## 2023-09-06 ENCOUNTER — Telehealth: Payer: Self-pay

## 2023-09-06 MED ORDER — CLONAZEPAM 0.5 MG PO TABS: ORAL_TABLET | ORAL | 1 refills | Status: DC

## 2023-09-06 NOTE — Telephone Encounter (Signed)
Copied from CRM [id number]. Topic: Clinical - Medication Refill >> Sep 06, 2023  1:45 PM Larwance Sachs wrote: Most Recent Primary Care Visit:  Provider: Lynnea Ferrier T  Department: [facility]  Visit Type: PHYSICAL  Date: 08/26/2023  Medication: clonazePAM (KLONOPIN) 0.5 MG tablet  Has the patient contacted their pharmacy? Yes (Agent: If no, request that the patient contact the pharmacy for the refill. If patient does not wish to contact the pharmacy document the reason why and proceed with request.) (Agent: If yes, when and what did the pharmacy advise?) Pharmacy stated they reach out and are waiting on response  Is this the correct pharmacy for this prescription? Yes If no, delete pharmacy and type the correct one.  This is the patient's preferred pharmacy:  [facility] - [place], [place] - [address] Marye Round [place] [address] Phone: [phone] Fax: [phone]  [id number] - [address], [place] - [address] [place] [address] Phone: [phone] Fax: [phone]  [facility] [address] [place]),  - [address] [place]) [place] [address] Phone: [phone] Fax: [phone]   Has the prescription been filled recently? No  Is the patient out of the medication? Yes  Has the patient been seen for an appointment in the last year OR does the patient have an upcoming appointment? Yes  Can we respond through MyChart? No, call back at [phone]  Patient rep also stated he informed Dr.Pickard the medication was needed at patients previous appointment, as well as the pharmacy has outreach to clinic and is waiting on a response  Agent: Please be advised that Rx refills may take up to 3 business days. We ask that you follow-up with your pharmacy.

## 2023-09-07 NOTE — Telephone Encounter (Signed)
Requested medication (s) are due for refill today - no  Requested medication (s) are on the active medication list -yes  Future visit scheduled -no  Last refill: 09/06/23 #60 1RF  Notes to clinic: non delegated Rx- duplicate request  Requested Prescriptions  Pending Prescriptions Disp Refills   clonazePAM (KLONOPIN) 0.5 MG tablet [Pharmacy Med Name: CLONAZEPAM 0.5 MG TAB 0.5 Tablet] 60 tablet 1    Sig: TAKE 1/2 TABLET BY MOUTH 3 TIMES DAILY AS NEEDED FOR ANXIETY.     Not Delegated - Psychiatry: Anxiolytics/Hypnotics 2 Failed - 09/06/2023  9:52 AM      Failed - This refill cannot be delegated      Failed - Urine Drug Screen completed in last 360 days      Failed - Valid encounter within last 6 months    Recent Outpatient Visits           1 year ago Type 2 diabetes mellitus with hyperglycemia, without long-term current use of insulin (HCC)   [facility] Pickard, Priscille Heidelberg, MD   1 year ago Dysuria   [facility] Pickard, Priscille Heidelberg, MD   1 year ago Essential hypertension   [facility] Tanya Nones, Priscille Heidelberg, MD   1 year ago Mass of parotid gland   [facility] Donita Brooks, MD   2 years ago Dizziness and giddiness   [facility] Pickard, Priscille Heidelberg, MD              Passed - Patient is not pregnant         Requested Prescriptions  Pending Prescriptions Disp Refills   clonazePAM (KLONOPIN) 0.5 MG tablet [Pharmacy Med Name: CLONAZEPAM 0.5 MG TAB 0.5 Tablet] 60 tablet 1    Sig: TAKE 1/2 TABLET BY MOUTH 3 TIMES DAILY AS NEEDED FOR ANXIETY.     Not Delegated - Psychiatry: Anxiolytics/Hypnotics 2 Failed - 09/06/2023  9:52 AM      Failed - This refill cannot be delegated      Failed - Urine Drug Screen completed in last 360 days      Failed - Valid encounter within last 6 months    Recent Outpatient Visits           1 year ago Type 2 diabetes mellitus with hyperglycemia, without long-term  current use of insulin (HCC)   [facility] Pickard, Priscille Heidelberg, MD   1 year ago Dysuria   [facility] Donita Brooks, MD   1 year ago Essential hypertension   [facility] Tanya Nones, Priscille Heidelberg, MD   1 year ago Mass of parotid gland   [facility] Donita Brooks, MD   2 years ago Dizziness and giddiness   [facility] Pickard, Priscille Heidelberg, MD              Passed - Patient is not pregnant

## 2023-09-11 ENCOUNTER — Inpatient Hospital Stay (HOSPITAL_COMMUNITY)
Admission: EM | Admit: 2023-09-11 | Discharge: 2023-09-14 | DRG: 690 | Disposition: A | Discharge status: Skilled Nursing Facility | Payer: Medicare Other | Attending: Family Medicine | Admitting: Family Medicine

## 2023-09-11 ENCOUNTER — Other Ambulatory Visit: Payer: Self-pay

## 2023-09-11 ENCOUNTER — Encounter (HOSPITAL_COMMUNITY): Payer: Self-pay | Admitting: Emergency Medicine

## 2023-09-11 ENCOUNTER — Emergency Department (HOSPITAL_COMMUNITY): Payer: Medicare Other

## 2023-09-11 DIAGNOSIS — Z85828 Personal history of other malignant neoplasm of skin: Secondary | ICD-10-CM | POA: Diagnosis not present

## 2023-09-11 DIAGNOSIS — K219 Gastro-esophageal reflux disease without esophagitis: Secondary | ICD-10-CM | POA: Diagnosis present

## 2023-09-11 DIAGNOSIS — F419 Anxiety disorder, unspecified: Secondary | ICD-10-CM | POA: Diagnosis present

## 2023-09-11 DIAGNOSIS — I739 Peripheral vascular disease, unspecified: Secondary | ICD-10-CM | POA: Diagnosis not present

## 2023-09-11 DIAGNOSIS — Z87891 Personal history of nicotine dependence: Secondary | ICD-10-CM | POA: Diagnosis not present

## 2023-09-11 DIAGNOSIS — Z888 Allergy status to other drugs, medicaments and biological substances status: Secondary | ICD-10-CM

## 2023-09-11 DIAGNOSIS — I10 Essential (primary) hypertension: Secondary | ICD-10-CM | POA: Diagnosis present

## 2023-09-11 DIAGNOSIS — N390 Urinary tract infection, site not specified: Secondary | ICD-10-CM | POA: Diagnosis present

## 2023-09-11 DIAGNOSIS — D72829 Elevated white blood cell count, unspecified: Secondary | ICD-10-CM | POA: Insufficient documentation

## 2023-09-11 DIAGNOSIS — E1151 Type 2 diabetes mellitus with diabetic peripheral angiopathy without gangrene: Secondary | ICD-10-CM | POA: Diagnosis present

## 2023-09-11 DIAGNOSIS — Z1612 Extended spectrum beta lactamase (ESBL) resistance: Secondary | ICD-10-CM | POA: Diagnosis present

## 2023-09-11 DIAGNOSIS — Z833 Family history of diabetes mellitus: Secondary | ICD-10-CM | POA: Diagnosis not present

## 2023-09-11 DIAGNOSIS — N1831 Chronic kidney disease, stage 3a: Secondary | ICD-10-CM | POA: Diagnosis present

## 2023-09-11 DIAGNOSIS — R531 Weakness: Secondary | ICD-10-CM

## 2023-09-11 DIAGNOSIS — G629 Polyneuropathy, unspecified: Secondary | ICD-10-CM | POA: Diagnosis not present

## 2023-09-11 DIAGNOSIS — Z7984 Long term (current) use of oral hypoglycemic drugs: Secondary | ICD-10-CM | POA: Diagnosis not present

## 2023-09-11 DIAGNOSIS — Z7902 Long term (current) use of antithrombotics/antiplatelets: Secondary | ICD-10-CM

## 2023-09-11 DIAGNOSIS — I129 Hypertensive chronic kidney disease with stage 1 through stage 4 chronic kidney disease, or unspecified chronic kidney disease: Secondary | ICD-10-CM | POA: Diagnosis not present

## 2023-09-11 DIAGNOSIS — E1165 Type 2 diabetes mellitus with hyperglycemia: Secondary | ICD-10-CM | POA: Diagnosis not present

## 2023-09-11 DIAGNOSIS — M1990 Unspecified osteoarthritis, unspecified site: Secondary | ICD-10-CM | POA: Diagnosis not present

## 2023-09-11 DIAGNOSIS — D509 Iron deficiency anemia, unspecified: Secondary | ICD-10-CM | POA: Diagnosis present

## 2023-09-11 DIAGNOSIS — B961 Klebsiella pneumoniae [K. pneumoniae] as the cause of diseases classified elsewhere: Secondary | ICD-10-CM | POA: Diagnosis not present

## 2023-09-11 DIAGNOSIS — I469 Cardiac arrest, cause unspecified: Secondary | ICD-10-CM | POA: Diagnosis not present

## 2023-09-11 DIAGNOSIS — I69354 Hemiplegia and hemiparesis following cerebral infarction affecting left non-dominant side: Secondary | ICD-10-CM | POA: Diagnosis not present

## 2023-09-11 DIAGNOSIS — Z8673 Personal history of transient ischemic attack (TIA), and cerebral infarction without residual deficits: Secondary | ICD-10-CM | POA: Diagnosis not present

## 2023-09-11 DIAGNOSIS — E1122 Type 2 diabetes mellitus with diabetic chronic kidney disease: Secondary | ICD-10-CM | POA: Diagnosis present

## 2023-09-11 DIAGNOSIS — W19XXXA Unspecified fall, initial encounter: Secondary | ICD-10-CM | POA: Diagnosis not present

## 2023-09-11 DIAGNOSIS — E782 Mixed hyperlipidemia: Secondary | ICD-10-CM | POA: Diagnosis not present

## 2023-09-11 DIAGNOSIS — Z8249 Family history of ischemic heart disease and other diseases of the circulatory system: Secondary | ICD-10-CM

## 2023-09-11 DIAGNOSIS — R4182 Altered mental status, unspecified: Secondary | ICD-10-CM | POA: Diagnosis not present

## 2023-09-11 DIAGNOSIS — Z79899 Other long term (current) drug therapy: Secondary | ICD-10-CM

## 2023-09-11 DIAGNOSIS — N3000 Acute cystitis without hematuria: Secondary | ICD-10-CM | POA: Diagnosis not present

## 2023-09-11 DIAGNOSIS — F32A Depression, unspecified: Secondary | ICD-10-CM | POA: Diagnosis present

## 2023-09-11 DIAGNOSIS — Z825 Family history of asthma and other chronic lower respiratory diseases: Secondary | ICD-10-CM | POA: Diagnosis not present

## 2023-09-11 DIAGNOSIS — Z810 Family history of intellectual disabilities: Secondary | ICD-10-CM

## 2023-09-11 DIAGNOSIS — Z7401 Bed confinement status: Secondary | ICD-10-CM | POA: Diagnosis not present

## 2023-09-11 DIAGNOSIS — R457 State of emotional shock and stress, unspecified: Secondary | ICD-10-CM | POA: Diagnosis not present

## 2023-09-11 DIAGNOSIS — Z841 Family history of disorders of kidney and ureter: Secondary | ICD-10-CM | POA: Diagnosis not present

## 2023-09-11 DIAGNOSIS — E860 Dehydration: Secondary | ICD-10-CM | POA: Insufficient documentation

## 2023-09-11 DIAGNOSIS — G2581 Restless legs syndrome: Secondary | ICD-10-CM | POA: Diagnosis not present

## 2023-09-11 DIAGNOSIS — J449 Chronic obstructive pulmonary disease, unspecified: Secondary | ICD-10-CM | POA: Diagnosis not present

## 2023-09-11 DIAGNOSIS — D539 Nutritional anemia, unspecified: Secondary | ICD-10-CM | POA: Diagnosis present

## 2023-09-11 DIAGNOSIS — R2681 Unsteadiness on feet: Secondary | ICD-10-CM | POA: Diagnosis not present

## 2023-09-11 DIAGNOSIS — Z7901 Long term (current) use of anticoagulants: Secondary | ICD-10-CM | POA: Diagnosis not present

## 2023-09-11 DIAGNOSIS — H04123 Dry eye syndrome of bilateral lacrimal glands: Secondary | ICD-10-CM | POA: Diagnosis not present

## 2023-09-11 DIAGNOSIS — M792 Neuralgia and neuritis, unspecified: Secondary | ICD-10-CM | POA: Diagnosis not present

## 2023-09-11 DIAGNOSIS — I959 Hypotension, unspecified: Secondary | ICD-10-CM | POA: Diagnosis not present

## 2023-09-11 LAB — URINALYSIS, ROUTINE W REFLEX MICROSCOPIC
Bilirubin Urine: NEGATIVE
Glucose, UA: NEGATIVE mg/dL
Hgb urine dipstick: NEGATIVE
Ketones, ur: NEGATIVE mg/dL
Nitrite: NEGATIVE
Protein, ur: 100 mg/dL — AB
Specific Gravity, Urine: 1.019 (ref 1.005–1.030)
WBC, UA: >50 WBC/hpf (ref 0–5)
pH: 5 (ref 5.0–8.0)

## 2023-09-11 LAB — URINALYSIS, W/ REFLEX TO CULTURE (INFECTION SUSPECTED)
Bilirubin Urine: NEGATIVE
Glucose, UA: 50 mg/dL — AB
Hgb urine dipstick: NEGATIVE
Ketones, ur: NEGATIVE mg/dL
Nitrite: NEGATIVE
Protein, ur: 100 mg/dL — AB
Specific Gravity, Urine: 1.023 (ref 1.005–1.030)
WBC, UA: >50 WBC/hpf (ref 0–5)
pH: 5 (ref 5.0–8.0)

## 2023-09-11 LAB — BASIC METABOLIC PANEL
Anion gap: 5 (ref 5–15)
BUN: 34 mg/dL — ABNORMAL HIGH (ref 8–23)
CO2: 25 mmol/L (ref 22–32)
Calcium: 8.5 mg/dL — ABNORMAL LOW (ref 8.9–10.3)
Chloride: 103 mmol/L (ref 98–111)
Creatinine, Ser: 1.47 mg/dL — ABNORMAL HIGH (ref 0.61–1.24)
GFR, Estimated: 46 mL/min — ABNORMAL LOW (ref >60)
Glucose, Bld: 242 mg/dL — ABNORMAL HIGH (ref 70–99)
Potassium: 4.5 mmol/L (ref 3.5–5.1)
Sodium: 133 mmol/L — ABNORMAL LOW (ref 135–145)

## 2023-09-11 LAB — CBG MONITORING, ED: Glucose-Capillary: 214 mg/dL — ABNORMAL HIGH (ref 70–99)

## 2023-09-11 LAB — CBC
HCT: 36.1 % — ABNORMAL LOW (ref 39.0–52.0)
Hemoglobin: 11.6 g/dL — ABNORMAL LOW (ref 13.0–17.0)
MCH: 32.5 pg (ref 26.0–34.0)
MCHC: 32.1 g/dL (ref 30.0–36.0)
MCV: 101.1 fL — ABNORMAL HIGH (ref 80.0–100.0)
Platelets: 173 10*3/uL (ref 150–400)
RBC: 3.57 MIL/uL — ABNORMAL LOW (ref 4.22–5.81)
RDW: 15.1 % (ref 11.5–15.5)
WBC: 13.7 10*3/uL — ABNORMAL HIGH (ref 4.0–10.5)
nRBC: 0 % (ref 0.0–0.2)

## 2023-09-11 MED ORDER — ACETAMINOPHEN 325 MG PO TABS: 650.0000 mg | ORAL_TABLET | Freq: Four times a day (QID) | ORAL | Status: DC | PRN

## 2023-09-11 MED ORDER — GABAPENTIN 300 MG PO CAPS
300.0000 mg | ORAL_CAPSULE | Freq: Two times a day (BID) | ORAL | Status: DC
  Administered 2023-09-11 – 2023-09-14 (×6): 300 mg via ORAL
  Filled 2023-09-11 (×6): qty 1

## 2023-09-11 MED ORDER — CLOPIDOGREL BISULFATE 75 MG PO TABS
75.0000 mg | ORAL_TABLET | Freq: Every day | ORAL | Status: DC
  Administered 2023-09-12 – 2023-09-14 (×3): 75 mg via ORAL
  Filled 2023-09-11 (×3): qty 1

## 2023-09-11 MED ORDER — ONDANSETRON HCL 4 MG PO TABS: 4.0000 mg | ORAL_TABLET | Freq: Four times a day (QID) | ORAL | Status: DC | PRN

## 2023-09-11 MED ORDER — METOPROLOL SUCCINATE ER 25 MG PO TB24
12.5000 mg | ORAL_TABLET | Freq: Every day | ORAL | Status: DC
  Administered 2023-09-12 – 2023-09-14 (×3): 12.5 mg via ORAL
  Filled 2023-09-11 (×3): qty 1

## 2023-09-11 MED ORDER — HYDRALAZINE HCL 50 MG PO TABS
100.0000 mg | ORAL_TABLET | Freq: Three times a day (TID) | ORAL | Status: DC
  Administered 2023-09-11 – 2023-09-14 (×8): 100 mg via ORAL
  Filled 2023-09-11 (×8): qty 2

## 2023-09-11 MED ORDER — SODIUM CHLORIDE 0.9 % IV SOLN: INTRAVENOUS | Status: DC

## 2023-09-11 MED ORDER — ROSUVASTATIN CALCIUM 10 MG PO TABS
20.0000 mg | ORAL_TABLET | Freq: Every day | ORAL | Status: DC
  Administered 2023-09-12 – 2023-09-14 (×3): 20 mg via ORAL
  Filled 2023-09-11 (×3): qty 2

## 2023-09-11 MED ORDER — EZETIMIBE 10 MG PO TABS
10.0000 mg | ORAL_TABLET | Freq: Every day | ORAL | Status: DC
  Administered 2023-09-12 – 2023-09-14 (×3): 10 mg via ORAL
  Filled 2023-09-11 (×3): qty 1

## 2023-09-11 MED ORDER — FLUOXETINE HCL 20 MG PO CAPS
20.0000 mg | ORAL_CAPSULE | Freq: Every day | ORAL | Status: DC
  Administered 2023-09-12 – 2023-09-14 (×3): 20 mg via ORAL
  Filled 2023-09-11 (×3): qty 1

## 2023-09-11 MED ORDER — ENOXAPARIN SODIUM 40 MG/0.4ML IJ SOSY
40.0000 mg | PREFILLED_SYRINGE | INTRAMUSCULAR | Status: DC
  Administered 2023-09-11 – 2023-09-13 (×3): 40 mg via SUBCUTANEOUS
  Filled 2023-09-11 (×3): qty 0.4

## 2023-09-11 MED ORDER — ACETAMINOPHEN 650 MG RE SUPP: 650.0000 mg | Freq: Four times a day (QID) | RECTAL | Status: DC | PRN

## 2023-09-11 MED ORDER — BUPROPION HCL ER (XL) 150 MG PO TB24
150.0000 mg | ORAL_TABLET | Freq: Every day | ORAL | Status: DC
  Administered 2023-09-12 – 2023-09-14 (×3): 150 mg via ORAL
  Filled 2023-09-11 (×3): qty 1

## 2023-09-11 MED ORDER — SODIUM CHLORIDE 0.9 % IV SOLN
1.0000 g | Freq: Once | INTRAVENOUS | Status: AC
  Administered 2023-09-11: 1 g via INTRAVENOUS
  Filled 2023-09-11: qty 10

## 2023-09-11 MED ORDER — SODIUM CHLORIDE 0.9 % IV SOLN
500.0000 mg | Freq: Three times a day (TID) | INTRAVENOUS | Status: DC
  Administered 2023-09-11 – 2023-09-13 (×5): 500 mg via INTRAVENOUS
  Filled 2023-09-11 (×7): qty 10

## 2023-09-11 MED ORDER — ONDANSETRON HCL 4 MG/2ML IJ SOLN: 4.0000 mg | Freq: Four times a day (QID) | INTRAMUSCULAR | Status: DC | PRN

## 2023-09-11 NOTE — ED Triage Notes (Signed)
Pt BIB EMS, coming from home. Per son Pt has been feeling weak and had couple of falls last night. This morning he had an unwitness fell, son doesn't think he lost conscious, Unsure if pt hit his head. Pt is on Plavix. Has hx of UTI, has increased confused for couple of days. Alert to self. Denies n/v or pain. VSS, NAD  BP 112/70, Hr 70s, Spo2 90 RA,

## 2023-09-11 NOTE — Progress Notes (Signed)
Pharmacy Antibiotic Note  Barry [name] is a 89 y.o. male admitted on 09/11/2023 with UTI.  Patient with history of ESBL Klebsiella pneumoniae UTI.  11/24 urinalysis positive for leukocytes.  Pharmacy has been consulted for Primaxin dosing (specified DAW per provider).  Plan: Primaxin 500 mg IV every 8 hours Monitor clinical progress, renal function F/U C&S, abx deescalation / LOT   Height: 5\' 10"  (177.8 cm) Weight: 80.3 kg (177 lb) IBW/kg (Calculated) : 73  Temp (24hrs), Avg:98.8 F (37.1 C), Min:98.7 F (37.1 C), Max:99 F (37.2 C)  Recent Labs  Lab 09/11/23 1233  WBC 13.7*  CREATININE 1.47*    Estimated Creatinine Clearance: 35.9 mL/min (A) (by C-G formula based on SCr of 1.47 mg/dL (H)).    Allergies  Allergen Reactions   Lisinopril-Hydrochlorothiazide Swelling   Doxycycline Rash    Antimicrobials this admission: 11/24 ceftriaxone x 1 11/24 Primaxin >>  Microbiology results: 11/24 UCx:    Thank you for allowing pharmacy to be a part of this patient's care.  Lynden Ang, PharmD, BCPS 09/11/2023 8:16 PM

## 2023-09-11 NOTE — ED Notes (Signed)
..ED TO INPATIENT HANDOFF REPORT  Name/Age/Gender Barry [name] 89 y.o. male  Code Status Code Status History     Date Active Date Inactive Code Status Order ID Comments User Context   07/31/2021 1335 08/26/2021 1613 Full Code [id number]  Charlton Amor, PA-C Inpatient   07/31/2021 1335 07/31/2021 1335 Full Code [id number]  Lynnae Prude Inpatient   07/27/2021 1430 07/31/2021 1239 Full Code [id number]  Erick Blinks, DO ED       Home/SNF/Other Home  Chief Complaint UTI (urinary tract infection) [N39.0]  Level of Care/Admitting Diagnosis ED Disposition     ED Disposition  Admit   Condition  --   Comment  Hospital Area: [facility] [[id number]]  Level of Care: Med-Surg [16]  May admit patient to [facility] or [facility] if equivalent level of care is available:: Yes  Covid Evaluation: Asymptomatic - no recent exposure (last 10 days) testing not required  Diagnosis: UTI (urinary tract infection) [643329]  Admitting Physician: Frankey Shown [[id number]]  Attending Physician: Frankey Shown [[id number]]  Certification:: I certify this patient will need inpatient services for at least 2 midnights  Expected Medical Readiness: 09/14/2023          Medical History Past Medical History:  Diagnosis Date   AAA (abdominal aortic aneurysm) (HCC) 12/17/2007   4.7 cm   Allergy    rhinitis   Anxiety    Cancer (HCC)    skin   Cataract    Depression    Diabetes mellitus    Diverticulosis    GERD (gastroesophageal reflux disease)    Hepatitis    Hx of colonic polyps 08/26/2015   Hyperlipidemia    Hypertension    Nephrolithiasis    Personal history of colonic polyps    adenomas, serrated also   PVD (peripheral vascular disease) (HCC)    Stroke (HCC)    Tobacco abuse     Allergies Allergies  Allergen Reactions   Lisinopril-Hydrochlorothiazide Swelling    ZESTRIL   Doxycycline Rash    IV Location/Drains/Wounds Patient  Lines/Drains/Airways Status     Active Line/Drains/Airways     Name Placement date Placement time Site Days   Peripheral IV 09/11/23 20 G Anterior;Distal;Left;Upper Arm 09/11/23  1647  Arm  less than 1            Labs/Imaging Results for orders placed or performed during the hospital encounter of 09/11/23 (from the past 48 hour(s))  CBG monitoring, ED     Status: Abnormal   Collection Time: 09/11/23 12:29 PM  Result Value Ref Range   Glucose-Capillary 214 (H) 70 - 99 mg/dL    Comment: Glucose reference range applies only to samples taken after fasting for at least 8 hours.  Basic metabolic panel     Status: Abnormal   Collection Time: 09/11/23 12:33 PM  Result Value Ref Range   Sodium 133 (L) 135 - 145 mmol/L   Potassium 4.5 3.5 - 5.1 mmol/L   Chloride 103 98 - 111 mmol/L   CO2 25 22 - 32 mmol/L   Glucose, Bld 242 (H) 70 - 99 mg/dL    Comment: Glucose reference range applies only to samples taken after fasting for at least 8 hours.   BUN 34 (H) 8 - 23 mg/dL   Creatinine, Ser 0.10 (H) 0.61 - 1.24 mg/dL   Calcium 8.5 (L) 8.9 - 10.3 mg/dL   GFR, Estimated 46 (L) >60 mL/min    Comment: (NOTE) Calculated using the  CKD-EPI Creatinine Equation (2021)    Anion gap 5 5 - 15    Comment: Performed at [facility], [address] [place], [place] [address]  CBC     Status: Abnormal   Collection Time: 09/11/23 12:33 PM  Result Value Ref Range   WBC 13.7 (H) 4.0 - 10.5 K/uL   RBC 3.57 (L) 4.22 - 5.81 MIL/uL   Hemoglobin 11.6 (L) 13.0 - 17.0 g/dL   HCT 62.1 (L) 30.8 - 65.7 %   MCV 101.1 (H) 80.0 - 100.0 fL   MCH 32.5 26.0 - 34.0 pg   MCHC 32.1 30.0 - 36.0 g/dL   RDW 84.6 96.2 - 95.2 %   Platelets 173 150 - 400 K/uL   nRBC 0.0 0.0 - 0.2 %    Comment: Performed at [facility], [address] [place], [place] [address]  Urinalysis, Routine w reflex microscopic -Urine, Clean Catch     Status: Abnormal   Collection Time: 09/11/23  2:23 PM   Result Value Ref Range   Color, Urine YELLOW YELLOW   APPearance CLOUDY (A) CLEAR   Specific Gravity, Urine 1.019 1.005 - 1.030   pH 5.0 5.0 - 8.0   Glucose, UA NEGATIVE NEGATIVE mg/dL   Hgb urine dipstick NEGATIVE NEGATIVE   Bilirubin Urine NEGATIVE NEGATIVE   Ketones, ur NEGATIVE NEGATIVE mg/dL   Protein, ur 440 (A) NEGATIVE mg/dL   Nitrite NEGATIVE NEGATIVE   Leukocytes,Ua LARGE (A) NEGATIVE   RBC / HPF 11-20 0 - 5 RBC/hpf   WBC, UA >50 0 - 5 WBC/hpf   Bacteria, UA RARE (A) NONE SEEN   Squamous Epithelial / HPF 0-5 0 - 5 /HPF   WBC Clumps PRESENT    Mucus PRESENT     Comment: Performed at [facility], [address] [place], [place] [address]   CT Head Wo Contrast  Result Date: 09/11/2023 CLINICAL DATA:  Weakness and fall. EXAM: CT HEAD WITHOUT CONTRAST TECHNIQUE: Contiguous axial images were obtained from the base of the skull through the vertex without intravenous contrast. RADIATION DOSE REDUCTION: This exam was performed according to the departmental dose-optimization program which includes automated exposure control, adjustment of the mA and/or kV according to patient size and/or use of iterative reconstruction technique. COMPARISON:  CT head dated March 21, 2023. FINDINGS: Brain: No evidence of acute infarction, hemorrhage, hydrocephalus, extra-axial collection or mass lesion/mass effect. Stable atrophy and advanced chronic microvascular ischemic changes. Vascular: Calcified atherosclerosis at the skull base. No hyperdense vessel. Skull: Normal. Negative for fracture or focal lesion. Sinuses/Orbits: No acute finding. Other: None. IMPRESSION: 1. No acute intracranial abnormality. 2. Stable atrophy and advanced chronic microvascular ischemic changes. Electronically Signed   By: Obie Dredge M.D.   On: 09/11/2023 18:29    Pending Labs Unresulted Labs (From admission, onward)    None       Vitals/Pain Today's Vitals   09/11/23 1558 09/11/23 1815  09/11/23 1817 09/11/23 1822  BP: (!) 120/51 130/61 122/65 (!) 124/54  Pulse: 63 66 70 80  Resp: (!) 22 18 18 18   Temp: 98.7 F (37.1 C) 98.7 F (37.1 C) 98.7 F (37.1 C) 98.7 F (37.1 C)  TempSrc: Oral   Oral  SpO2: 99% 99% 99% 100%  Weight:      Height:      PainSc:        Isolation Precautions No active isolations  Medications Medications  cefTRIAXone (ROCEPHIN) 1 g in sodium chloride  0.9 % 100 mL IVPB (0 g Intravenous Stopped 09/11/23 1815)    Mobility walks with person assist

## 2023-09-11 NOTE — ED Provider Notes (Signed)
[place] EMERGENCY DEPARTMENT AT [facility] Provider Note   CSN: [id number] Arrival date & time: 09/11/23  1148     History  Chief Complaint  Patient presents with   Weakness   Fall    Barry [name] is a 89 y.o. male.  HPI   89 year old male presents emergency department with generalized weakness and a couple falls over the past week.  He currently lives at home with his son.  He has a caregiver from 8-12 but otherwise the son independently cares for him.  Over the past couple days he has been having worsening weakness.  Yesterday while he was in the bathroom he missed stepped and had to grab the bar on the wall and lower himself to the ground.  The son heard the fall, stated that it did not sound like a loud/hard fall.  When he went in there he was sitting up and alert.  Patient denies syncope or loss of consciousness. He denies any acute injuries including any head injury/pain, neck pain, back pain, extremity pain. At baseline the patient can walk with a walker and do independent living activities.  However over the last 24 hours he has been unable to walk or care for himself.  He admits to urinary frequency but denies any dysuria or hematuria.  Denies any fever/chills.    Home Medications Prior to Admission medications   Medication Sig Start Date End Date Taking? Authorizing Provider  acetaminophen (TYLENOL) 325 MG tablet Take 2 tablets (650 mg total) by mouth every 6 (six) hours as needed for mild pain (or Fever >/= 101). 08/24/21   Angiulli, Mcarthur Rossetti, PA-C  amLODipine (NORVASC) 10 MG tablet Take 1 tablet (10 mg total) by mouth daily. 08/26/23   Donita Brooks, MD  artificial tears (LACRILUBE) OINT ophthalmic ointment Place into both eyes every 4 (four) hours as needed for dry eyes. 08/24/21   Angiulli, Mcarthur Rossetti, PA-C  buPROPion (WELLBUTRIN XL) 150 MG 24 hr tablet Take 1 tablet (150 mg total) by mouth daily. 08/26/23   Donita Brooks, MD  clonazePAM (KLONOPIN)  0.5 MG tablet TAKE 1/2 TABLET BY MOUTH 3 TIMES DAILY AS NEEDED FOR ANXIETY. 09/06/23   Donita Brooks, MD  clopidogrel (PLAVIX) 75 MG tablet Take 1 tablet (75 mg total) by mouth daily. tAKE 1 TABLET (75 MG TOTAL) BY MOUTH DAILY. nEED APPOINTMENT WITH PRIMARY CARE PROVIDER FOR FURTHER REFILLS 08/26/23   Donita Brooks, MD  cyanocobalamin (VITAMIN B12) 1000 MCG tablet Take 1 tablet (1,000 mcg total) by mouth daily. 08/26/23   Donita Brooks, MD  ezetimibe (ZETIA) 10 MG tablet Take 1 tablet (10 mg total) by mouth daily. 08/26/23   Donita Brooks, MD  FLUoxetine (PROZAC) 20 MG capsule TAKE (1) CAPSULE BY MOUTH EACH MORNING. 08/26/23   Donita Brooks, MD  gabapentin (NEURONTIN) 300 MG capsule Take 1 capsule (300 mg total) by mouth 2 (two) times daily. 08/26/23   Donita Brooks, MD  glipiZIDE (GLIPIZIDE XL) 10 MG 24 hr tablet Take 1 tablet (10 mg total) by mouth daily with breakfast. 08/26/23   Donita Brooks, MD  hydrALAZINE (APRESOLINE) 100 MG tablet Take 1 tablet (100 mg total) by mouth every 8 (eight) hours. 08/26/23   Donita Brooks, MD  metoprolol succinate (TOPROL-XL) 25 MG 24 hr tablet Take 0.5 tablets (12.5 mg total) by mouth daily. 08/26/23   Donita Brooks, MD  montelukast (SINGULAIR) 10 MG tablet TAKE 1 TABLET (  10 MG TOTAL) BY MOUTH AT BEDTIME. 06/22/22   Donita Brooks, MD  pioglitazone (ACTOS) 30 MG tablet Take 1 tablet (30 mg total) by mouth daily. 08/26/23   Donita Brooks, MD  RESTASIS 0.05 % ophthalmic emulsion 1 drop 2 (two) times daily. Patient not taking: Reported on 08/26/2023 11/09/22   [provider]  rosuvastatin (CRESTOR) 20 MG tablet Take 1 tablet (20 mg total) by mouth daily. 08/26/23   Donita Brooks, MD  sitaGLIPtin (JANUVIA) 100 MG tablet Take 1 tablet (100 mg total) by mouth daily. 08/26/23   Donita Brooks, MD  trimethoprim-polymyxin b (POLYTRIM) ophthalmic solution Place 2 drops into the left eye every 4 (four) hours. 05/17/23   Donita Brooks, MD  valsartan (DIOVAN) 160 MG tablet Take 1 tablet (160 mg total) by mouth 2 (two) times daily. 08/26/23   Donita Brooks, MD      Allergies    Lisinopril-hydrochlorothiazide and Doxycycline    Review of Systems   Review of Systems  Constitutional:  Positive for fatigue. Negative for appetite change and fever.  Respiratory:  Negative for shortness of breath.   Cardiovascular:  Negative for chest pain.  Gastrointestinal:  Negative for abdominal pain, diarrhea and vomiting.  Genitourinary:  Positive for frequency. Negative for dysuria, flank pain and hematuria.  Musculoskeletal:  Negative for back pain and neck pain.  Skin:  Negative for rash.  Neurological:  Positive for weakness. Negative for headaches.    Physical Exam Updated Vital Signs BP (!) 104/53 (BP Location: Right Arm)   Pulse 61   Temp 99 F (37.2 C) (Oral)   Resp 12   Ht 5\' 10"  (1.778 m)   Wt 80.3 kg   SpO2 96%   BMI 25.40 kg/m  Physical Exam Vitals and nursing note reviewed.  Constitutional:      Appearance: Normal appearance.     Comments: Fatigued appearing  HENT:     Head: Normocephalic and atraumatic.     Mouth/Throat:     Mouth: Mucous membranes are moist.  Eyes:     Pupils: Pupils are equal, round, and reactive to light.  Cardiovascular:     Rate and Rhythm: Normal rate.  Pulmonary:     Effort: Pulmonary effort is normal. No respiratory distress.  Abdominal:     Palpations: Abdomen is soft.     Tenderness: There is no abdominal tenderness. There is no guarding or rebound.  Musculoskeletal:        General: No swelling or deformity.     Cervical back: No rigidity or tenderness.  Skin:    General: Skin is warm.     Comments: Superficial abrasion to right ankle  Neurological:     Mental Status: He is alert and oriented to person, place, and time.     Comments: Generally weak     ED Results / Procedures / Treatments   Labs (all labs ordered are listed, but only abnormal results  are displayed) Labs Reviewed  BASIC METABOLIC PANEL - Abnormal; Notable for the following components:      Result Value   Sodium 133 (*)    Glucose, Bld 242 (*)    BUN 34 (*)    Creatinine, Ser 1.47 (*)    Calcium 8.5 (*)    GFR, Estimated 46 (*)    All other components within normal limits  CBC - Abnormal; Notable for the following components:   WBC 13.7 (*)    RBC 3.57 (*)  Hemoglobin 11.6 (*)    HCT 36.1 (*)    MCV 101.1 (*)    All other components within normal limits  CBG MONITORING, ED - Abnormal; Notable for the following components:   Glucose-Capillary 214 (*)    All other components within normal limits  URINALYSIS, ROUTINE W REFLEX MICROSCOPIC    EKG EKG Interpretation Date/Time:  Sunday September 11 2023 12:02:40 EST Ventricular Rate:  66 PR Interval:  156 QRS Duration:  97 QT Interval:  379 QTC Calculation: 397 R Axis:   12  Text Interpretation: Sinus rhythm Ventricular premature complex Consider anterior infarct Similar to previous Confirmed by Coralee Pesa [phone]) on 09/11/2023 1:08:34 PM  Radiology No results found.  Procedures Procedures    Medications Ordered in ED Medications - No data to display  ED Course/ Medical Decision Making/ A&P                                 Medical Decision Making Amount and/or Complexity of Data Reviewed Labs: ordered. Radiology: ordered.  Risk Decision regarding hospitalization.   89 year old male presents emergency department with worsening weakness male inability to walk and episode of confusion.  Vital stable on arrival.  He looks generally fatigued but denies any acute complaints.  He has had multiple low mechanism falls.  Denies any head injury or loss of consciousness/syncope.  Son is at bedside who cares for him.  They have a caregiver, for couple hours a day but he states that his current state he would be able to care for him as he is not able to walk with a walker which is baseline.  Blood  work shows a mild leukocytosis.  Urinalysis looks like a urinary tract infection.  Antibiotics ordered and urine culture sent.  Head CT shows no acute finding, orthostatic vitals are stable.  Patients evaluation and results requires admission for further treatment and care.  Spoke with hospitalist, reviewed patient's ED course and they accept admission.  Patient agrees with admission plan, offers no new complaints and is stable/unchanged at time of admit.        Final Clinical Impression(s) / ED Diagnoses Final diagnoses:  None    Rx / DC Orders ED Discharge Orders     None         Rozelle Logan, DO 09/11/23 1919

## 2023-09-11 NOTE — Plan of Care (Signed)
  Problem: Education: Goal: Knowledge of General Education information will improve Description: Including pain rating scale, medication(s)/side effects and non-pharmacologic comfort measures Outcome: Progressing   Problem: Safety: Goal: Ability to remain free from injury will improve Outcome: Progressing   

## 2023-09-11 NOTE — H&P (Signed)
History and Physical    Patient: Barry [name] JYN:[id number] DOB: 10-Feb-1933 DOA: 09/11/2023 DOS: the patient was seen and examined on 09/11/2023 PCP: Donita Brooks, MD  Patient coming from: Home  Chief Complaint:  Chief Complaint  Patient presents with   Weakness   Fall   HPI: Barry [name] is a 89 y.o. male with medical history significant of hypertension, hyperlipidemia, T2DM, history of stroke, anxiety and depression who presents to the emergency department via EMS from home due to generalized weakness and fall at home. Patient lives at home with son and ambulates with a walker and was capable of doing his ADLs at baseline, he has a caregiver from 8 AM to 12 noon and his son takes care of him for the rest of the day.  Most of the history was obtained from ED physician and son at bedside.  Per report, patient has been having increasing weakness within the last couple of days, yesterday, he sustained a fall after mis-stepping and he had to grab a bar and lower himself to the floor and sustained a small abrasion around the right ankle.  Son heard the fall and quickly ran to assist patient was noted to be sitting up and was alert. Patient was also reported to present with incontinence yesterday in the morning (this was unusual from baseline), he felt so weak that he was unable to ambulate with a walker even with assistance.  Patient admits to urinary frequency, but denies blood in urine or any painful urination, he denies chest pain, shortness of breath, head injury, back pain.  ED Course:  In the emergency department, BP was 110/56, other vital signs were within normal range.  Workup in the ED showed leukocytosis and microcytic anemia, BMP was normal except for sodium of 133, blood glucose 242, BUN/creatinine 34/1.47 (creatinine is within baseline range).  Urinalysis was suggestive of UTI. CT head without contrast showed no acute or acute abnormality Patient was treated with IV  ceftriaxone.  Hospitalist was asked to admit patient for further evaluation and management.  Review of Systems: Review of systems as noted in the HPI. All other systems reviewed and are negative.   Past Medical History:  Diagnosis Date   AAA (abdominal aortic aneurysm) (HCC) 12/17/2007   4.7 cm   Allergy    rhinitis   Anxiety    Cancer (HCC)    skin   Cataract    Depression    Diabetes mellitus    Diverticulosis    GERD (gastroesophageal reflux disease)    Hepatitis    Hx of colonic polyps 08/26/2015   Hyperlipidemia    Hypertension    Nephrolithiasis    Personal history of colonic polyps    adenomas, serrated also   PVD (peripheral vascular disease) (HCC)    Stroke (HCC)    Tobacco abuse    Past Surgical History:  Procedure Laterality Date   COLONOSCOPY  2006, 2008, 2010, 05/27/2011   numerous adenomas - 13 in 2006, 3, 2008, 2 2012 (up to 1 cm), 4 diminutive adenomas, serrated adenomas 2012. Diverticulosis and hemorrhoids also.   EYE SURGERY     EYE SURGERY Left    Kidney stone operation     lower limb amputation, other toe 5th     percutaneous stent graft repair of infrarenal AAA  11/10   5.6 cm (T. early)   surgical excision basal cell carcinoma     TEE WITHOUT CARDIOVERSION N/A 07/29/2021   Procedure: TRANSESOPHAGEAL ECHOCARDIOGRAM (  TEE);  Surgeon: Sande Rives, MD;  Location: [facility];  Service: Cardiovascular;  Laterality: N/A;   tympanic eardrum repair     VASECTOMY      Social History:  reports that he has quit smoking. His smoking use included cigarettes. He has a 60 pack-year smoking history. He has never used smokeless tobacco. He reports that he does not drink alcohol and does not use drugs.   Allergies  Allergen Reactions   Lisinopril-Hydrochlorothiazide Swelling   Doxycycline Rash    Family History  Problem Relation Age of Onset   Emphysema Father    Cancer Brother        Lung   Depression Brother    Depression Sister    Lung disease  Sister        Fibrosis and died from pneumonia on Apr 02, 2013   Kidney disease Sister    Diabetes Sister    Learning disabilities Sister    Mental retardation Sister    Heart disease Mother    Cancer Brother    Colon cancer Neg Hx    Dementia Neg Hx    Alcohol abuse Neg Hx    Drug abuse Neg Hx    Paranoid behavior Neg Hx    Schizophrenia Neg Hx    Anxiety disorder Neg Hx    Bipolar disorder Neg Hx    OCD Neg Hx    Sexual abuse Neg Hx    Physical abuse Neg Hx    Seizures Neg Hx    Esophageal cancer Neg Hx    Stomach cancer Neg Hx    Rectal cancer Neg Hx      Prior to Admission medications   Medication Sig Start Date End Date Taking? Authorizing Provider  acetaminophen (TYLENOL) 325 MG tablet Take 2 tablets (650 mg total) by mouth every 6 (six) hours as needed for mild pain (or Fever >/= 101). 08/24/21   Angiulli, Mcarthur Rossetti, PA-C  amLODipine (NORVASC) 10 MG tablet Take 1 tablet (10 mg total) by mouth daily. 08/26/23   Donita Brooks, MD  artificial tears (LACRILUBE) OINT ophthalmic ointment Place into both eyes every 4 (four) hours as needed for dry eyes. 08/24/21   Angiulli, Mcarthur Rossetti, PA-C  buPROPion (WELLBUTRIN XL) 150 MG 24 hr tablet Take 1 tablet (150 mg total) by mouth daily. 08/26/23   Donita Brooks, MD  clonazePAM (KLONOPIN) 0.5 MG tablet TAKE 1/2 TABLET BY MOUTH 3 TIMES DAILY AS NEEDED FOR ANXIETY. 09/06/23   Donita Brooks, MD  clopidogrel (PLAVIX) 75 MG tablet Take 1 tablet (75 mg total) by mouth daily. tAKE 1 TABLET (75 MG TOTAL) BY MOUTH DAILY. nEED APPOINTMENT WITH PRIMARY CARE PROVIDER FOR FURTHER REFILLS 08/26/23   Donita Brooks, MD  cyanocobalamin (VITAMIN B12) 1000 MCG tablet Take 1 tablet (1,000 mcg total) by mouth daily. 08/26/23   Donita Brooks, MD  ezetimibe (ZETIA) 10 MG tablet Take 1 tablet (10 mg total) by mouth daily. 08/26/23   Donita Brooks, MD  FLUoxetine (PROZAC) 20 MG capsule TAKE (1) CAPSULE BY MOUTH EACH MORNING. 08/26/23   Donita Brooks,  MD  gabapentin (NEURONTIN) 300 MG capsule Take 1 capsule (300 mg total) by mouth 2 (two) times daily. 08/26/23   Donita Brooks, MD  glipiZIDE (GLIPIZIDE XL) 10 MG 24 hr tablet Take 1 tablet (10 mg total) by mouth daily with breakfast. 08/26/23   Donita Brooks, MD  hydrALAZINE (APRESOLINE) 100 MG tablet Take 1 tablet (100 mg  total) by mouth every 8 (eight) hours. 08/26/23   Donita Brooks, MD  metoprolol succinate (TOPROL-XL) 25 MG 24 hr tablet Take 0.5 tablets (12.5 mg total) by mouth daily. 08/26/23   Donita Brooks, MD  montelukast (SINGULAIR) 10 MG tablet TAKE 1 TABLET (10 MG TOTAL) BY MOUTH AT BEDTIME. 06/22/22   Donita Brooks, MD  pioglitazone (ACTOS) 30 MG tablet Take 1 tablet (30 mg total) by mouth daily. 08/26/23   Donita Brooks, MD  RESTASIS 0.05 % ophthalmic emulsion 1 drop 2 (two) times daily. Patient not taking: Reported on 08/26/2023 11/09/22   [provider]  rosuvastatin (CRESTOR) 20 MG tablet Take 1 tablet (20 mg total) by mouth daily. 08/26/23   Donita Brooks, MD  sitaGLIPtin (JANUVIA) 100 MG tablet Take 1 tablet (100 mg total) by mouth daily. 08/26/23   Donita Brooks, MD  trimethoprim-polymyxin b (POLYTRIM) ophthalmic solution Place 2 drops into the left eye every 4 (four) hours. 05/17/23   Donita Brooks, MD  valsartan (DIOVAN) 160 MG tablet Take 1 tablet (160 mg total) by mouth 2 (two) times daily. 08/26/23   Donita Brooks, MD    Physical Exam: BP (!) 146/65 (BP Location: Right Arm)   Pulse 67   Temp 98.4 F (36.9 C) (Oral)   Resp 16   Ht 5\' 10"  (1.778 m)   Wt 80.3 kg   SpO2 99%   BMI 25.40 kg/m   General: 89 y.o. year-old male well developed well nourished in no acute distress.  Alert and oriented x3. HEENT: NCAT, EOMI, Dry mucous membrane Neck: Supple, trachea medial Cardiovascular: Regular rate and rhythm with no rubs or gallops.  No thyromegaly or JVD noted.  No lower extremity edema. 2/4 pulses in all 4  extremities. Respiratory: Clear to auscultation with no wheezes or rales. Good inspiratory effort. Abdomen: Soft, nontender nondistended with normal bowel sounds x4 quadrants. Muskuloskeletal: No cyanosis, clubbing or edema noted bilaterally Neuro: CN II-XII intact, strength 5/5 x 4, sensation, reflexes intact Skin: Small abrasion lesions in right ankle.  Psychiatry: Judgement and insight appear normal. Mood is appropriate for condition and setting          Labs on Admission:  Basic Metabolic Panel: Recent Labs  Lab 09/11/23 1233  NA 133*  K 4.5  CL 103  CO2 25  GLUCOSE 242*  BUN 34*  CREATININE 1.47*  CALCIUM 8.5*   Liver Function Tests: No results for input(s): "AST", "ALT", "ALKPHOS", "BILITOT", "PROT", "ALBUMIN" in the last 168 hours. No results for input(s): "LIPASE", "AMYLASE" in the last 168 hours. No results for input(s): "AMMONIA" in the last 168 hours. CBC: Recent Labs  Lab 09/11/23 1233  WBC 13.7*  HGB 11.6*  HCT 36.1*  MCV 101.1*  PLT 173   Cardiac Enzymes: No results for input(s): "CKTOTAL", "CKMB", "CKMBINDEX", "TROPONINI" in the last 168 hours.  BNP (last 3 results) No results for input(s): "BNP" in the last 8760 hours.  ProBNP (last 3 results) No results for input(s): "PROBNP" in the last 8760 hours.  CBG: Recent Labs  Lab 09/11/23 1229  GLUCAP 214*    Radiological Exams on Admission: CT Head Wo Contrast  Result Date: 09/11/2023 CLINICAL DATA:  Weakness and fall. EXAM: CT HEAD WITHOUT CONTRAST TECHNIQUE: Contiguous axial images were obtained from the base of the skull through the vertex without intravenous contrast. RADIATION DOSE REDUCTION: This exam was performed according to the departmental dose-optimization program which includes automated exposure control, adjustment  of the mA and/or kV according to patient size and/or use of iterative reconstruction technique. COMPARISON:  CT head dated March 21, 2023. FINDINGS: Brain: No evidence of  acute infarction, hemorrhage, hydrocephalus, extra-axial collection or mass lesion/mass effect. Stable atrophy and advanced chronic microvascular ischemic changes. Vascular: Calcified atherosclerosis at the skull base. No hyperdense vessel. Skull: Normal. Negative for fracture or focal lesion. Sinuses/Orbits: No acute finding. Other: None. IMPRESSION: 1. No acute intracranial abnormality. 2. Stable atrophy and advanced chronic microvascular ischemic changes. Electronically Signed   By: Obie Dredge M.D.   On: 09/11/2023 18:29    EKG: I independently viewed the EKG done and my findings are as followed: Normal sinus rhythm at a rate of 66 bpm with VPCs  Assessment/Plan Present on Admission:  UTI (urinary tract infection)  Type 2 diabetes mellitus with hyperglycemia, without long-term current use of insulin (HCC)  Stage 3a chronic kidney disease (HCC)  Essential hypertension  Mixed hyperlipidemia  Depression  Principal Problem:   UTI (urinary tract infection) Active Problems:   Mixed hyperlipidemia   Essential hypertension   Depression   Stage 3a chronic kidney disease (HCC)   Type 2 diabetes mellitus with hyperglycemia, without long-term current use of insulin (HCC)   Generalized weakness   Leukocytosis   Dehydration   Macrocytic anemia   History of stroke   Peripheral neuropathy  UTI POA Urinalysis was suggestive of UTI, patient complained of increased urinary frequency and he presented with urinary incontinence (unusual at baseline) Urine culture on 04/17/2023 was positive for Klebsiella pneumoniae which was sensitive to imipenem, but resistant to ceftriaxone. He was started on IV ceftriaxone, we shall continue with imipenem Urine culture pending  Generalized weakness in the setting of above Continue management as described above Continue PT/OT eval and treat TOC will be consulted for possible discharge to SNF for rehabilitation if needed  Leukocytosis WBC 13.7, this is  possibly due to UTI Continue treatment as described for UTI  Dehydration Continue IV hydration  CKD stage 3A Stable, creatinine is within baseline range  Macrocytic Anemia MCV 101.1.  This may be due to folic acid/B12 deficiency, alcohol/liver disease, hypothyroidism etc Vitamin B12/folate levels will be checked Patient has no history of alcohol use Liver enzymes and TSH levels will be checked  Essential hypertension Continue Toprol-XL, hydralazine  Mixed hyperlipidemia Continue Zetia, Crestor  Depression Continue fluoxetine, bupropion  Peripheral neuropathy Continue gabapentin  History of stroke Continue Plavix, Crestor   DVT prophylaxis: Lovenox  Code Status: Full code  Family Communication: Son at bedside (all questions answered to satisfaction)  Consults: None  Severity of Illness: The appropriate patient status for this patient is INPATIENT. Inpatient status is judged to be reasonable and necessary in order to provide the required intensity of service to ensure the patient's safety. The patient's presenting symptoms, physical exam findings, and initial radiographic and laboratory data in the context of their chronic comorbidities is felt to place them at high risk for further clinical deterioration. Furthermore, it is not anticipated that the patient will be medically stable for discharge from the hospital within 2 midnights of admission.   * I certify that at the point of admission it is my clinical judgment that the patient will require inpatient hospital care spanning beyond 2 midnights from the point of admission due to high intensity of service, high risk for further deterioration and high frequency of surveillance required.*  Author: Frankey Shown, DO 09/11/2023 9:00 PM  For on call review [email or web address].

## 2023-09-12 LAB — CBC
HCT: 33.7 % — ABNORMAL LOW (ref 39.0–52.0)
Hemoglobin: 10.6 g/dL — ABNORMAL LOW (ref 13.0–17.0)
MCH: 32.1 pg (ref 26.0–34.0)
MCHC: 31.5 g/dL (ref 30.0–36.0)
MCV: 102.1 fL — ABNORMAL HIGH (ref 80.0–100.0)
Platelets: 146 10*3/uL — ABNORMAL LOW (ref 150–400)
RBC: 3.3 MIL/uL — ABNORMAL LOW (ref 4.22–5.81)
RDW: 15.1 % (ref 11.5–15.5)
WBC: 11.1 10*3/uL — ABNORMAL HIGH (ref 4.0–10.5)
nRBC: 0 % (ref 0.0–0.2)

## 2023-09-12 LAB — PHOSPHORUS: Phosphorus: 3.2 mg/dL (ref 2.5–4.6)

## 2023-09-12 LAB — FOLATE: Folate: 11.4 ng/mL (ref >5.9)

## 2023-09-12 LAB — COMPREHENSIVE METABOLIC PANEL
ALT: 13 U/L (ref 0–44)
AST: 13 U/L — ABNORMAL LOW (ref 15–41)
Albumin: 2.5 g/dL — ABNORMAL LOW (ref 3.5–5.0)
Alkaline Phosphatase: 26 U/L — ABNORMAL LOW (ref 38–126)
Anion gap: 3 — ABNORMAL LOW (ref 5–15)
BUN: 35 mg/dL — ABNORMAL HIGH (ref 8–23)
CO2: 25 mmol/L (ref 22–32)
Calcium: 8.2 mg/dL — ABNORMAL LOW (ref 8.9–10.3)
Chloride: 107 mmol/L (ref 98–111)
Creatinine, Ser: 1.47 mg/dL — ABNORMAL HIGH (ref 0.61–1.24)
GFR, Estimated: 46 mL/min — ABNORMAL LOW (ref >60)
Glucose, Bld: 120 mg/dL — ABNORMAL HIGH (ref 70–99)
Potassium: 4.6 mmol/L (ref 3.5–5.1)
Sodium: 135 mmol/L (ref 135–145)
Total Bilirubin: 0.4 mg/dL (ref <1.2)
Total Protein: 5 g/dL — ABNORMAL LOW (ref 6.5–8.1)

## 2023-09-12 LAB — VITAMIN B12: Vitamin B-12: 377 pg/mL (ref 180–914)

## 2023-09-12 LAB — MAGNESIUM: Magnesium: 2 mg/dL (ref 1.7–2.4)

## 2023-09-12 LAB — TSH: TSH: 1.719 u[IU]/mL (ref 0.350–4.500)

## 2023-09-12 NOTE — TOC Initial Note (Signed)
Transition of Care [facility]) - Initial/Assessment Note    Patient Details  Name: Barry [name] MRN: [id number] Date of Birth: Apr 11, 1933  [facility] CM/SW Contact:    Otelia Santee, LCSW Phone Number: 09/12/2023, 12:18 PM  Clinical Narrative:                 Pt from home where he lives with his son. Pt receives private caregiver services from 8am to 12pm daily. Pt and pt's son agreeable to recommendation for SNF placement. Their top choice for placement is at [facility]. Referrals have been sent out for SNF placement and currently awaiting bed offers.  Pt's son shares that it is becoming more difficult to manage his fathers care at home and is interested in having conversations around alternate options. CSW provided pt's son with agency resources to explore in home care vs placement.   Expected Discharge Plan: Skilled Nursing Facility Barriers to Discharge: No Barriers Identified   Patient Goals and CMS Choice Patient states their goals for this hospitalization and ongoing recovery are:: For pt to go to SNF CMS Medicare.gov Compare Post Acute Care list provided to:: Patient Choice offered to / list presented to : Patient, HC POA / Guardian [place] ownership interest in [facility].provided to:: Adult Children    Expected Discharge Plan and Services In-house Referral: Clinical Social Work Discharge Planning Services: NA Post Acute Care Choice: Skilled Nursing Facility Living arrangements for the past 2 months: Single Family Home                 DME Arranged: N/A DME Agency: NA                  Prior Living Arrangements/Services Living arrangements for the past 2 months: Single Family Home Lives with:: Adult Children Patient language and need for interpreter reviewed:: Yes Do you feel safe going back to the place where you live?: Yes      Need for Family Participation in Patient Care: Yes (Comment) Care giver support system in place?: Yes  (comment) Current home services: DME, Other (comment) (Private caregiver 4 hrs a day) Criminal Activity/Legal Involvement Pertinent to Current Situation/Hospitalization: No - Comment as needed  Activities of Daily Living   ADL Screening (condition at time of admission) Independently performs ADLs?: No Does the patient have a NEW difficulty with bathing/dressing/toileting/self-feeding that is expected to last >3 days?: No Does the patient have a NEW difficulty with getting in/out of bed, walking, or climbing stairs that is expected to last >3 days?: No Does the patient have a NEW difficulty with communication that is expected to last >3 days?: No Is the patient deaf or have difficulty hearing?: Yes Does the patient have difficulty seeing, even when wearing glasses/contacts?: No Does the patient have difficulty concentrating, remembering, or making decisions?: No  Permission Sought/Granted Permission sought to share information with : Facility Medical sales representative, Family Supports Permission granted to share information with : Yes, Verbal Permission Granted  Share Information with NAME: Tuan Delsignore  Permission granted to share info w AGENCY: SNF's  Permission granted to share info w Relationship: Son  Permission granted to share info w Contact Information: [phone]  Emotional Assessment Appearance:: Appears stated age Attitude/Demeanor/Rapport: Engaged Affect (typically observed): Pleasant, Accepting Orientation: : Oriented to Self, Oriented to Place, Oriented to  Time, Oriented to Situation Alcohol / Substance Use: Not Applicable Psych Involvement: No (comment)  Admission diagnosis:  UTI (urinary tract infection) [N39.0] Urinary tract infection  without hematuria, site unspecified [N39.0] Patient Active Problem List   Diagnosis Date Noted   UTI (urinary tract infection) 09/11/2023   Generalized weakness 09/11/2023   Leukocytosis 09/11/2023   Dehydration 09/11/2023    Macrocytic anemia 09/11/2023   History of stroke 09/11/2023   Peripheral neuropathy 09/11/2023   Subacute cough 09/22/2022   Right thalamic infarction (HCC) 07/31/2021   Stage 3a chronic kidney disease (HCC)    Benign essential HTN    Left hemiparesis (HCC)    Type 2 diabetes mellitus with hyperglycemia, without long-term current use of insulin (HCC)    CVA (cerebral vascular accident) (HCC) 07/27/2021   Frequent PVCs 03/20/2020   Aortic stenosis 03/20/2020   COPD (chronic obstructive pulmonary disease) (HCC) 03/20/2020   Constipation 04/30/2019   Protein-calorie malnutrition (HCC) 04/30/2019   Depression 12/05/2018   Seasonal and perennial allergic rhinitis 08/09/2018   Deviated nasal septum 08/01/2018   Eustachian tube dysfunction, left 08/01/2018   Nasal obstruction 08/01/2018   Carotid artery disease (HCC) 11/18/2017   Hx of colonic polyps 08/26/2015   Erectile dysfunction 02/17/2015   AAA (abdominal aortic aneurysm) without rupture (HCC) 05/21/2014   Chest pain 01/31/2014   CAD (coronary artery disease) 12/24/2013   Conductive hearing loss 02/23/2010   DIZZINESS, CHRONIC 05/28/2008   PULMONARY NODULE 09/07/2007   Diabetes mellitus type II, controlled (HCC) 09/06/2007   Mixed hyperlipidemia 09/06/2007   TOBACCO ABUSE 09/06/2007   Essential hypertension 09/06/2007   Allergic rhinitis 09/06/2007   GERD 09/06/2007   Malignant neoplasm of skin of face 09/06/2007   PCP:  Donita Brooks, MD Pharmacy:   [facility] - [place], [place] - [address] [place] [address] Phone: [phone] Fax: [phone]  [id number] - [address] [place] - [address] [place] [address] Phone: [phone] Fax: [phone]  [facility] [address]), [place] - [address] [place] ([place]) [place] [address] Phone: [phone] Fax: [phone]     Social Determinants of  Health (SDOH) Social History: SDOH Screenings   Food Insecurity: No Food Insecurity (09/11/2023)  Housing: Low Risk  (09/11/2023)  Transportation Needs: No Transportation Needs (09/11/2023)  Utilities: Not At Risk (09/11/2023)  Alcohol Screen: Low Risk  (01/06/2023)  Depression (PHQ2-9): Low Risk  (08/26/2023)  Financial Resource Strain: Low Risk  (01/06/2023)  Physical Activity: Inactive (01/06/2023)  Social Connections: Moderately Isolated (01/06/2023)  Stress: No Stress Concern Present (01/06/2023)  Tobacco Use: Medium Risk (09/11/2023)   SDOH Interventions:     Readmission Risk Interventions    09/12/2023   12:14 PM 07/29/2021    2:57 PM  Readmission Risk Prevention Plan  Medication Screening  Complete  Transportation Screening Complete Complete  PCP or Specialist Appt within 3-5 Days Complete   HRI or Home Care Consult Complete   Social Work Consult for Recovery Care Planning/Counseling Complete   Palliative Care Screening Not Applicable   Medication Review Oceanographer) Complete

## 2023-09-12 NOTE — [place] FL2 (Signed)
[place] MEDICAID FL2 LEVEL OF CARE FORM     IDENTIFICATION  Patient Name: Barry [name] Birthdate: 05-23-1933 Sex: male Admission Date (Current Location): 09/11/2023  [facility] and [place] Number:  Producer, television/film/video and Address:  [facility],  [address] [place], [place] [address]      Provider Number: [id number]  Attending Physician Name and Address:  Frankey Shown, DO  Relative Name and Phone Number:  Sahibjot, [employer] [phone]    Current Level of Care: Hospital Recommended Level of Care: Skilled Nursing Facility Prior Approval Number:    Date Approved/Denied:   PASRR Number: [id number]  Discharge Plan: SNF    Current Diagnoses: Patient Active Problem List   Diagnosis Date Noted   UTI (urinary tract infection) 09/11/2023   Generalized weakness 09/11/2023   Leukocytosis 09/11/2023   Dehydration 09/11/2023   Macrocytic anemia 09/11/2023   History of stroke 09/11/2023   Peripheral neuropathy 09/11/2023   Subacute cough 09/22/2022   Right thalamic infarction (HCC) 07/31/2021   Stage 3a chronic kidney disease (HCC)    Benign essential HTN    Left hemiparesis (HCC)    Type 2 diabetes mellitus with hyperglycemia, without long-term current use of insulin (HCC)    CVA (cerebral vascular accident) (HCC) 07/27/2021   Frequent PVCs 03/20/2020   Aortic stenosis 03/20/2020   COPD (chronic obstructive pulmonary disease) (HCC) 03/20/2020   Constipation 04/30/2019   Protein-calorie malnutrition (HCC) 04/30/2019   Depression 12/05/2018   Seasonal and perennial allergic rhinitis 08/09/2018   Deviated nasal septum 08/01/2018   Eustachian tube dysfunction, left 08/01/2018   Nasal obstruction 08/01/2018   Carotid artery disease (HCC) 11/18/2017   Hx of colonic polyps 08/26/2015   Erectile dysfunction 02/17/2015   AAA (abdominal aortic aneurysm) without rupture (HCC) 05/21/2014   Chest pain 01/31/2014   CAD (coronary artery disease) 12/24/2013    Conductive hearing loss 02/23/2010   DIZZINESS, CHRONIC 05/28/2008   PULMONARY NODULE 09/07/2007   Diabetes mellitus type II, controlled (HCC) 09/06/2007   Mixed hyperlipidemia 09/06/2007   TOBACCO ABUSE 09/06/2007   Essential hypertension 09/06/2007   Allergic rhinitis 09/06/2007   GERD 09/06/2007   Malignant neoplasm of skin of face 09/06/2007    Orientation RESPIRATION BLADDER Height & Weight     Self, Time, Situation, Place  Normal Continent Weight: 177 lb (80.3 kg) Height:  5\' 10"  (177.8 cm)  BEHAVIORAL SYMPTOMS/MOOD NEUROLOGICAL BOWEL NUTRITION STATUS      Continent Diet (Heart healthy)  AMBULATORY STATUS COMMUNICATION OF NEEDS Skin   Limited Assist Verbally Normal                       Personal Care Assistance Level of Assistance  Bathing, Feeding, Dressing Bathing Assistance: Limited assistance Feeding assistance: Limited assistance Dressing Assistance: Limited assistance     Functional Limitations Info  Sight, Hearing, Speech Sight Info: Adequate Hearing Info: Impaired Speech Info: Adequate    SPECIAL CARE FACTORS FREQUENCY  PT (By licensed PT), OT (By licensed OT)     PT Frequency: 5x/wk OT Frequency: 5x/wk            Contractures Contractures Info: Not present    Additional Factors Info  Code Status, Allergies Code Status Info: FULL Allergies Info: Lisinopril-hydrochlorothiazide, Doxycycline           Current Medications (09/12/2023):  This is the current hospital active medication list Current Facility-Administered Medications  Medication Dose Route Frequency Provider Last Rate Last Admin   0.9 %  sodium chloride infusion   Intravenous Continuous Adefeso, Oladapo, DO 100 mL/hr at 09/11/23 2048 New Bag at 09/11/23 2048   acetaminophen (TYLENOL) tablet 650 mg  650 mg Oral Q6H PRN Frankey Shown, DO       Or   acetaminophen (TYLENOL) suppository 650 mg  650 mg Rectal Q6H PRN Adefeso, Oladapo, DO       buPROPion (WELLBUTRIN XL) 24 hr  tablet 150 mg  150 mg Oral Daily Adefeso, Oladapo, DO   150 mg at 09/12/23 1037   clopidogrel (PLAVIX) tablet 75 mg  75 mg Oral Daily Adefeso, Oladapo, DO   75 mg at 09/12/23 1037   enoxaparin (LOVENOX) injection 40 mg  40 mg Subcutaneous Q24H Adefeso, Oladapo, DO   40 mg at 09/11/23 2150   ezetimibe (ZETIA) tablet 10 mg  10 mg Oral Daily Adefeso, Oladapo, DO   10 mg at 09/12/23 1037   FLUoxetine (PROZAC) capsule 20 mg  20 mg Oral Daily Adefeso, Oladapo, DO   20 mg at 09/12/23 1037   gabapentin (NEURONTIN) capsule 300 mg  300 mg Oral BID Adefeso, Oladapo, DO   300 mg at 09/12/23 1037   hydrALAZINE (APRESOLINE) tablet 100 mg  100 mg Oral Q8H Adefeso, Oladapo, DO   100 mg at 09/12/23 0528   imipenem-cilastatin (PRIMAXIN) 500 mg in sodium chloride 0.9 % 100 mL IVPB  500 mg Intravenous Q8H Lynden Ang, RPH 200 mL/hr at 09/12/23 0529 500 mg at 09/12/23 0529   metoprolol succinate (TOPROL-XL) 24 hr tablet 12.5 mg  12.5 mg Oral Daily Adefeso, Oladapo, DO   12.5 mg at 09/12/23 1037   ondansetron (ZOFRAN) tablet 4 mg  4 mg Oral Q6H PRN Adefeso, Oladapo, DO       Or   ondansetron (ZOFRAN) injection 4 mg  4 mg Intravenous Q6H PRN Adefeso, Oladapo, DO       rosuvastatin (CRESTOR) tablet 20 mg  20 mg Oral Daily Adefeso, Oladapo, DO   20 mg at 09/12/23 1037     Discharge Medications: Please see discharge summary for a list of discharge medications.  Relevant Imaging Results:  Relevant Lab Results:   Additional Information SSN: [id number]  Otelia Santee, LCSW

## 2023-09-12 NOTE — Evaluation (Signed)
Occupational Therapy Evaluation Patient Details Name: Barry [name] MRN: [id number] DOB: 25-Oct-1932 Today's Date: 09/12/2023   History of Present Illness 89 y.o. male  who presented to the emergency department via EMS from home due to generalized weakness and fall at home. Dx of UTI. Pt  with medical history significant of hypertension, hyperlipidemia, T2DM, history of stroke, anxiety and depression   Clinical Impression   Prior to hospital admission, pt with gradual functional decline over the past year. Hx of frequent falls. Pt lives with son in two story home with stair lift and has aide during the mornings, but is alone in the afternoons. Aide assists with ADLs and sponge baths, pt primarily uses wc for mobility but walks ~60ft with RW with aide.   Overall, pt will require setup for eating/grooming activities, minA for UB ADLs seated, modA for LB ADLs seated, modA for stand pivot/squat pivot to [facility], and maxA for toliet hygiene. Pt's level of cognitive impairments impact ADL peformance in addition to his current presentation of weakness. Pt would benefit from skilled OT services to address noted impairments and functional limitations (see below for any additional details) in order to maximize safety and independence while minimizing falls risk and caregiver burden. Patient will benefit from continued inpatient follow up therapy, <3 hours/day.      If plan is discharge home, recommend the following: A lot of help with walking and/or transfers;A lot of help with bathing/dressing/bathroom;Direct supervision/assist for medications management;Direct supervision/assist for financial management;Supervision due to cognitive status;Help with stairs or ramp for entrance;Assist for transportation    Functional Status Assessment  Patient has had a recent decline in their functional status and demonstrates the ability to make significant improvements in function in a reasonable and predictable amount  of time.  Equipment Recommendations  None recommended by OT (defer to next LOC)    Recommendations for Other Services       Precautions / Restrictions Precautions Precautions: Fall Precaution Comments: son reports 2-3 falls in past couple weeks, and several others in past 6 months Restrictions Weight Bearing Restrictions: No      Mobility Bed Mobility               General bed mobility comments: in recliner    Transfers Overall transfer level: Needs assistance Equipment used: Rolling walker (2 wheels) Transfers: Sit to/from Stand, Bed to chair/wheelchair/BSC Sit to Stand: Mod assist, From elevated surface           General transfer comment: pt declined to transfer this date, anticipate modA for stand pivot to [facility] (could use drop arm if fatigued)      Balance Overall balance assessment: Needs assistance Sitting-balance support: Feet supported Sitting balance-Leahy Scale: Good Sitting balance - Comments: pt's son initally reporting poor seated balance requiring unilateral UE support, pt able to maintain static sitting balance without UE support for ~10 seconds   Standing balance support: During functional activity, Bilateral upper extremity supported, Reliant on assistive device for balance Standing balance-Leahy Scale: Poor Standing balance comment: stands with RW, BUE support                           ADL either performed or assessed with clinical judgement   ADL Overall ADL's : Needs assistance/impaired Eating/Feeding: Set up;Sitting   Grooming: Minimal assistance;Sitting   Upper Body Bathing: Minimal assistance;Sitting   Lower Body Bathing: Moderate assistance;Sit to/from stand;Sitting/lateral leans   Upper Body Dressing : Minimal assistance;Cueing for  sequencing   Lower Body Dressing: Moderate assistance;Sitting/lateral leans Lower Body Dressing Details (indicate cue type and reason): dons/doffs socks with minA and cues for figure four  technique Toilet Transfer: Squat-pivot;Stand-pivot;BSC/3in1;Rolling walker (2 wheels);Requires drop arm   Toileting- Clothing Manipulation and Hygiene: Maximal assistance;Sitting/lateral lean       Functional mobility during ADLs: Rolling walker (2 wheels);Moderate assistance General ADL Comments: Overall, pt will require setup for eating/grooming activities, minA for UB ADLs seated, modA for LB ADLs seated, modA for stand pivot/squat pivot to [facility], and maxA for toliet hygiene. Pt's level of cognitive impairments impact ADL peformance in addition to his current presentation of weakness.     Vision Baseline Vision/History: 1 Wears glasses Patient Visual Report: No change from baseline Vision Assessment?: Wears glasses for reading Additional Comments: reads clock WNL, glasses for reading            Pertinent Vitals/Pain Pain Assessment Pain Assessment: Faces Faces Pain Scale: Hurts little more Pain Location: back Pain Descriptors / Indicators: Aching, Cramping, Dull, Grimacing, Discomfort Pain Intervention(s): Limited activity within patient's tolerance, Monitored during session, Other (comment) (son reports that pt does not typically complain of back pain but had a hard time getting pt OOB at home over the weekend so pt may have strained his back)     Extremity/Trunk Assessment Upper Extremity Assessment Upper Extremity Assessment: Generalized weakness;Right hand dominant;LUE deficits/detail LUE Deficits / Details: hx of CVA, decreased FMC noted while donning/doffing socks   Lower Extremity Assessment Lower Extremity Assessment: Defer to PT evaluation LLE Deficits / Details: h/o CVA with L HP, pt not able to actively extend L knee, reports sensation intact to light touch, stated he can't use his LLE to walk LLE Sensation: WNL LLE Coordination: decreased gross motor   Cervical / Trunk Assessment Cervical / Trunk Assessment: Normal   Communication Communication Communication:  Hearing impairment   Cognition Arousal: Alert Behavior During Therapy: WFL for tasks assessed/performed Overall Cognitive Status: History of cognitive impairments - at baseline                                 General Comments: oriented to self, not to month/year, poor historian, poor STM noted during eval                Home Living Family/patient expects to be discharged to:: Private residence Living Arrangements: Children Available Help at Discharge: Family;Available PRN/intermittently;Personal care attendant Type of Home: House Home Access: Level entry     Home Layout: Two level;Able to live on main level with bedroom/bathroom Alternate Level Stairs-Number of Steps: 12   Bathroom Shower/Tub: Walk-in shower;Sponge bathes at baseline   [employer]: Handicapped height Bathroom Accessibility: Yes How Accessible: Accessible via wheelchair;Accessible via walker Home Equipment: Wheelchair - Forensic psychologist (2 wheels);Grab bars - toilet;Other (comment) (stair lift to get to second floor)   Additional Comments: lives with son and DIL who work; aide comes 8-12 M-F; son provided hx as pt is poor historian. pt's son gets him into the shower on weekends (uses chair lift to get to second floor, reports pt has very poor seated balance and needs UE support for seated showering).      Prior Functioning/Environment Prior Level of Function : Needs assist       Physical Assist : ADLs (physical);Mobility (physical)     Mobility Comments: pt sometimes transfers independently to wc, but usually has aide or son assist. walking ~25  ft with aide but spends most of the day in wc using feet to propel. pt sometimes uses RW for transfers, often stand pivots from bed to wc ADLs Comments: pt has aide that assists with ADLs, son helps with IADLs        OT Problem List: Decreased range of motion;Decreased strength;Impaired balance (sitting and/or standing);Decreased  cognition;Decreased safety awareness;Decreased knowledge of use of DME or AE      OT Treatment/Interventions: Self-care/ADL training;Therapeutic exercise;Neuromuscular education;DME and/or AE instruction;Therapeutic activities;Cognitive remediation/compensation;Patient/family education;Balance training    OT Goals(Current goals can be found in the care plan section) Acute Rehab OT Goals OT Goal Formulation: With patient/family Time For Goal Achievement: 09/26/23 Potential to Achieve Goals: Fair  OT Frequency: Min 1X/week       AM-PAC OT "6 Clicks" Daily Activity     Outcome Measure Help from another person eating meals?: None Help from another person taking care of personal grooming?: None Help from another person toileting, which includes using toliet, bedpan, or urinal?: A Lot Help from another person bathing (including washing, rinsing, drying)?: A Lot Help from another person to put on and taking off regular upper body clothing?: A Little Help from another person to put on and taking off regular lower body clothing?: A Lot 6 Click Score: 17   End of Session Equipment Utilized During Treatment: Gait belt;Rolling walker (2 wheels) Nurse Communication: Mobility status  Activity Tolerance: Patient tolerated treatment well Patient left: in chair;with call bell/phone within reach;with chair alarm set;with family/visitor present  OT Visit Diagnosis: Unsteadiness on feet (R26.81);Muscle weakness (generalized) (M62.81);Repeated falls (R29.6);Other symptoms and signs involving cognitive function                Time: 1914-7829 OT Time Calculation (min): 30 min Charges:  OT General Charges $OT Visit: 1 Visit OT Evaluation $OT Eval Low Complexity: 1 Low OT Treatments $Self Care/Home Management : 8-22 mins  Barry [name], OTR/L  09/12/23, 11:38 AM  09/12/2023, 11:34 AM

## 2023-09-12 NOTE — Evaluation (Signed)
Physical Therapy Evaluation Patient Details Name: Barry [name] MRN: [id number] DOB: Sep 19, 1933 Today's Date: 09/12/2023  History of Present Illness  89 y.o. male  who presented to the emergency department via EMS from home due to generalized weakness and fall at home. Dx of UTI. Pt  with medical history significant of hypertension, hyperlipidemia, T2DM, history of stroke, anxiety and depression  Clinical Impression  Pt admitted with above diagnosis. Pt primarily uses a WC at baseline, transfers independently to [facility], he walks very short distances with a RW with assistance. He has a Programmer, systems 4 hours/ day who assists with ADLs, lives with son who works during the day. Son reports 2-3 falls in past 2-3 weeks, and several other falls in past 6 months. Pt required mod assist for sit to stand, and min assist to safely transfer bed to recliner with RW today. Patient will benefit from continued inpatient follow up therapy, <3 hours/day. Pt currently with functional limitations due to the deficits listed below (see PT Problem List). Pt will benefit from acute skilled PT to increase their independence and safety with mobility to allow discharge.           If plan is discharge home, recommend the following: A lot of help with walking and/or transfers;A lot of help with bathing/dressing/bathroom;Assistance with cooking/housework;Assist for transportation   Can travel by private vehicle   No    Equipment Recommendations None recommended by PT  Recommendations for Other Services       Functional Status Assessment Patient has had a recent decline in their functional status and demonstrates the ability to make significant improvements in function in a reasonable and predictable amount of time.     Precautions / Restrictions Precautions Precautions: Fall Precaution Comments: son reports 2-3 falls in past couple weeks, and several others in past 6 months Restrictions Weight Bearing Restrictions:  No      Mobility  Bed Mobility Overal bed mobility: Needs Assistance Bed Mobility: Supine to Sit     Supine to sit: Mod assist, HOB elevated     General bed mobility comments: assist to raise trunk    Transfers Overall transfer level: Needs assistance Equipment used: Rolling walker (2 wheels) Transfers: Sit to/from Stand, Bed to chair/wheelchair/BSC Sit to Stand: Mod assist, From elevated surface   Step pivot transfers: Min assist       General transfer comment: assist to power up, VCs hand placement, attempted to sit prior to reaching recliner    Ambulation/Gait                  Stairs            Wheelchair Mobility     Tilt Bed    Modified Rankin (Stroke Patients Only)       Balance Overall balance assessment: Needs assistance Sitting-balance support: Feet supported Sitting balance-Leahy Scale: Good     Standing balance support: During functional activity, Bilateral upper extremity supported, Reliant on assistive device for balance Standing balance-Leahy Scale: Poor                               Pertinent Vitals/Pain Pain Assessment Pain Assessment: 0-10 Pain Score: 0-No pain    Home Living Family/patient expects to be discharged to:: Private residence Living Arrangements: Children Available Help at Discharge: Family;Available PRN/intermittently;Personal care attendant Type of Home: House Home Access: Level entry       Home Layout: One level  Home Equipment: Wheelchair - Forensic psychologist (2 wheels) Additional Comments: lives with son and DIL who work; aide comes 8-12 M-F; son provided hx as pt is poor historian    Prior Function Prior Level of Function : Needs assist       Physical Assist : ADLs (physical);Mobility (physical)     Mobility Comments: transfers independently to [facility], independent toilet transfers, reports he walks short distances with RW only when he has help; multiple falls in past 6 months ADLs  Comments: assist from aide for bathing/dressing     Extremity/Trunk Assessment   Upper Extremity Assessment Upper Extremity Assessment: Defer to OT evaluation    Lower Extremity Assessment Lower Extremity Assessment: LLE deficits/detail LLE Deficits / Details: h/o CVA with L HP, pt not able to actively extend L knee, reports sensation intact to light touch, stated he can't use his LLE to walk LLE Sensation: WNL LLE Coordination: decreased gross motor    Cervical / Trunk Assessment Cervical / Trunk Assessment: Normal  Communication   Communication Communication: Hearing impairment  Cognition Arousal: Alert Behavior During Therapy: WFL for tasks assessed/performed Overall Cognitive Status: History of cognitive impairments - at baseline                                 General Comments: oriented to self, not to month/year, poor historian        General Comments      Exercises     Assessment/Plan    PT Assessment Patient needs continued PT services  PT Problem List Decreased activity tolerance;Decreased mobility;Decreased balance;Decreased strength       PT Treatment Interventions Gait training;Functional mobility training;Therapeutic activities;Patient/family education;Balance training;Therapeutic exercise    PT Goals (Current goals can be found in the Care Plan section)  Acute Rehab PT Goals Patient Stated Goal: to get stronger, stop falling PT Goal Formulation: With patient/family Time For Goal Achievement: 09/26/23 Potential to Achieve Goals: Good    Frequency Min 1X/week     Co-evaluation               AM-PAC PT "6 Clicks" Mobility  Outcome Measure Help needed turning from your back to your side while in a flat bed without using bedrails?: A Little Help needed moving from lying on your back to sitting on the side of a flat bed without using bedrails?: A Lot Help needed moving to and from a bed to a chair (including a wheelchair)?: A  Lot Help needed standing up from a chair using your arms (e.g., wheelchair or bedside chair)?: A Lot Help needed to walk in hospital room?: Total Help needed climbing 3-5 steps with a railing? : Total 6 Click Score: 11    End of Session Equipment Utilized During Treatment: Gait belt Activity Tolerance: Patient tolerated treatment well Patient left: in chair;with call bell/phone within reach;with family/visitor present;with chair alarm set Nurse Communication: Mobility status PT Visit Diagnosis: Other abnormalities of gait and mobility (R26.89);Difficulty in walking, not elsewhere classified (R26.2)    Time: 1610-9604 PT Time Calculation (min) (ACUTE ONLY): 25 min   Charges:   PT Evaluation $PT Eval Moderate Complexity: 1 Mod PT Treatments $Therapeutic Activity: 8-22 mins PT General Charges $$ ACUTE PT VISIT: 1 Visit         Tamala Ser PT 09/12/2023  Acute Rehabilitation Services  Office [phone]

## 2023-09-13 DIAGNOSIS — N390 Urinary tract infection, site not specified: Secondary | ICD-10-CM | POA: Diagnosis not present

## 2023-09-13 LAB — GLUCOSE, CAPILLARY
Glucose-Capillary: 258 mg/dL — ABNORMAL HIGH (ref 70–99)
Glucose-Capillary: 209 mg/dL — ABNORMAL HIGH (ref 70–99)
Glucose-Capillary: 153 mg/dL — ABNORMAL HIGH (ref 70–99)

## 2023-09-13 MED ORDER — INSULIN ASPART 100 UNIT/ML IJ SOLN
0.0000 [IU] | Freq: Three times a day (TID) | INTRAMUSCULAR | Status: DC
  Administered 2023-09-13: 5 [IU] via SUBCUTANEOUS
  Administered 2023-09-14: 2 [IU] via SUBCUTANEOUS
  Administered 2023-09-14: 5 [IU] via SUBCUTANEOUS

## 2023-09-13 MED ORDER — SODIUM CHLORIDE 0.9 % IV SOLN
1.0000 g | Freq: Two times a day (BID) | INTRAVENOUS | Status: DC
  Administered 2023-09-13 – 2023-09-14 (×3): 1 g via INTRAVENOUS
  Filled 2023-09-13 (×3): qty 20

## 2023-09-13 MED ORDER — SALINE SPRAY 0.65 % NA SOLN
1.0000 | NASAL | Status: DC | PRN
  Administered 2023-09-13: 1 via NASAL
  Filled 2023-09-13: qty 44

## 2023-09-13 MED ORDER — MELATONIN 5 MG PO TABS
5.0000 mg | ORAL_TABLET | Freq: Once | ORAL | Status: AC
  Administered 2023-09-13: 5 mg via ORAL
  Filled 2023-09-13: qty 1

## 2023-09-13 NOTE — Progress Notes (Signed)
Marland KitchensrAte too much as [place] [facility] Progress Note  Patient: Barry [name]     YQI:[id number]  DOA: 09/11/2023   PCP: Donita Brooks, MD       Brief hospital course: This is an 89 year old male with diabetes mellitus, hypertension, history of CVA who presents from home due to generalized weakness and falls.  Family has noted increased confusion as well.  In the emergency room he was noted to have a UA consistent with a UTI and a WBC count of 13.7.  He was also felt to be dehydrated.  Antibiotics and IV fluids were started.  Subjective:  He has no complaints today.   Assessment and Plan: Principal Problem:   UTI (urinary tract infection) - Urine cultures growing greater than 100,000 gram-negative rods - He has a history of ESBL Klebsiella -Imipenem being switched to meropenem today - Continue to follow urine culture  Active Problems:     Stage 3a chronic kidney disease (HCC) -Follow-up as needed    Type 2 diabetes mellitus with hyperglycemia, without long-term current use of insulin (HCC) -On Actos as outpatient - Start sliding scale insulin    Generalized weakness Will likely be discharged to skilled nursing facility       Code Status: Full Code Total time on patient care: 35 DVT prophylaxis:  enoxaparin (LOVENOX) injection 40 mg Start: 09/11/23 2200 SCDs Start: 09/11/23 2008     Objective:   Vitals:   09/12/23 1149 09/12/23 1928 09/12/23 2135 09/13/23 0502  BP: 133/62 (!) 154/66 (!) 149/68 (!) 164/65  Pulse: (!) 54 69 66 63  Resp: 17 15  20   Temp: (!) 97.4 F (36.3 C) 98.1 F (36.7 C)  97.9 F (36.6 C)  TempSrc: Oral     SpO2: 97% 100%  96%  Weight:      Height:       Filed Weights   09/11/23 1232  Weight: 80.3 kg   Exam: General exam: Appears comfortable  HEENT: oral mucosa moist Respiratory system: Clear to auscultation.  Cardiovascular system: S1 & S2 heard  Gastrointestinal system: Abdomen soft, non-tender, nondistended. Normal  bowel sounds   Extremities: No cyanosis, clubbing or edema Psychiatry:  Mood & affect appropriate.      CBC: Recent Labs  Lab 09/11/23 1233 09/12/23 0446  WBC 13.7* 11.1*  HGB 11.6* 10.6*  HCT 36.1* 33.7*  MCV 101.1* 102.1*  PLT 173 146*   Basic Metabolic Panel: Recent Labs  Lab 09/11/23 1233 09/12/23 0446  NA 133* 135  K 4.5 4.6  CL 103 107  CO2 25 25  GLUCOSE 242* 120*  BUN 34* 35*  CREATININE 1.47* 1.47*  CALCIUM 8.5* 8.2*  MG  --  2.0  PHOS  --  3.2     Scheduled Meds:  buPROPion  150 mg Oral Daily   clopidogrel  75 mg Oral Daily   enoxaparin (LOVENOX) injection  40 mg Subcutaneous Q24H   ezetimibe  10 mg Oral Daily   FLUoxetine  20 mg Oral Daily   gabapentin  300 mg Oral BID   hydrALAZINE  100 mg Oral Q8H   metoprolol succinate  12.5 mg Oral Daily   rosuvastatin  20 mg Oral Daily   Continuous Infusions:  sodium chloride 100 mL/hr at 09/13/23 0534   imipenem-cilastatin 500 mg (09/13/23 0535)   Imaging and lab data was personally reviewed   Author: Calvert Cantor  09/13/2023 11:46 AM  To contact [facility]>   Check the care team  in [facility] and look for the attending/consulting TRH provider listed  Log into [email or web address] and use [place]'s universal password   Go to> "[facility]"  and find provider  If you still have difficulty reaching the provider, please page the [facility] (Director on Call) for the Hospitalists listed on amion

## 2023-09-13 NOTE — TOC Progression Note (Signed)
Transition of Care [facility]) - Progression Note    Patient Details  Name: Barry [name] MRN: [id number] Date of Birth: 1932-12-25  [facility] CM/SW Contact  Otelia Santee, LCSW Phone Number: 09/13/2023, 10:40 AM  Clinical Narrative:    Met with pt to review bed offer. Pt accepted offer for [facility] in [place]. Pt will have 3rd inpatient midnight tonight and will be able to transfer to facility tomorrow at the earliest if medically ready.    Expected Discharge Plan: Skilled Nursing Facility Barriers to Discharge: No Barriers Identified  Expected Discharge Plan and Services In-house Referral: Clinical Social Work Discharge Planning Services: NA Post Acute Care Choice: Skilled Nursing Facility Living arrangements for the past 2 months: Single Family Home                 DME Arranged: N/A DME Agency: NA                   Social Determinants of Health (SDOH) Interventions SDOH Screenings   Food Insecurity: No Food Insecurity (09/11/2023)  Housing: Low Risk  (09/11/2023)  Transportation Needs: No Transportation Needs (09/11/2023)  Utilities: Not At Risk (09/11/2023)  Alcohol Screen: Low Risk  (01/06/2023)  Depression (PHQ2-9): Low Risk  (08/26/2023)  Financial Resource Strain: Low Risk  (01/06/2023)  Physical Activity: Inactive (01/06/2023)  Social Connections: Moderately Isolated (01/06/2023)  Stress: No Stress Concern Present (01/06/2023)  Tobacco Use: Medium Risk (09/11/2023)    Readmission Risk Interventions    09/12/2023   12:14 PM 07/29/2021    2:57 PM  Readmission Risk Prevention Plan  Medication Screening  Complete  Transportation Screening Complete Complete  PCP or Specialist Appt within 3-5 Days Complete   HRI or Home Care Consult Complete   Social Work Consult for Recovery Care Planning/Counseling Complete   Palliative Care Screening Not Applicable   Medication Review Oceanographer) Complete

## 2023-09-13 NOTE — Plan of Care (Signed)
  Problem: Clinical Measurements: Goal: Ability to maintain clinical measurements within normal limits will improve Outcome: Progressing Goal: Will remain free from infection Outcome: Progressing Goal: Diagnostic test results will improve Outcome: Progressing Goal: Respiratory complications will improve Outcome: Progressing Goal: Cardiovascular complication will be avoided Outcome: Progressing   Problem: Pain Management: Goal: General experience of comfort will improve Outcome: Progressing   Problem: Elimination: Goal: Will not experience complications related to bowel motility Outcome: Progressing Goal: Will not experience complications related to urinary retention Outcome: Progressing

## 2023-09-14 DIAGNOSIS — N1831 Chronic kidney disease, stage 3a: Secondary | ICD-10-CM | POA: Diagnosis not present

## 2023-09-14 DIAGNOSIS — J449 Chronic obstructive pulmonary disease, unspecified: Secondary | ICD-10-CM | POA: Diagnosis not present

## 2023-09-14 DIAGNOSIS — R410 Disorientation, unspecified: Secondary | ICD-10-CM | POA: Diagnosis not present

## 2023-09-14 DIAGNOSIS — R0602 Shortness of breath: Secondary | ICD-10-CM | POA: Diagnosis not present

## 2023-09-14 DIAGNOSIS — R2681 Unsteadiness on feet: Secondary | ICD-10-CM | POA: Diagnosis not present

## 2023-09-14 DIAGNOSIS — E1151 Type 2 diabetes mellitus with diabetic peripheral angiopathy without gangrene: Secondary | ICD-10-CM | POA: Diagnosis not present

## 2023-09-14 DIAGNOSIS — R457 State of emotional shock and stress, unspecified: Secondary | ICD-10-CM | POA: Diagnosis not present

## 2023-09-14 DIAGNOSIS — F32A Depression, unspecified: Secondary | ICD-10-CM | POA: Diagnosis not present

## 2023-09-14 DIAGNOSIS — Z7901 Long term (current) use of anticoagulants: Secondary | ICD-10-CM | POA: Diagnosis not present

## 2023-09-14 DIAGNOSIS — M792 Neuralgia and neuritis, unspecified: Secondary | ICD-10-CM | POA: Diagnosis not present

## 2023-09-14 DIAGNOSIS — F419 Anxiety disorder, unspecified: Secondary | ICD-10-CM | POA: Diagnosis not present

## 2023-09-14 DIAGNOSIS — I10 Essential (primary) hypertension: Secondary | ICD-10-CM | POA: Diagnosis not present

## 2023-09-14 DIAGNOSIS — Z7401 Bed confinement status: Secondary | ICD-10-CM | POA: Diagnosis not present

## 2023-09-14 DIAGNOSIS — R531 Weakness: Secondary | ICD-10-CM | POA: Diagnosis not present

## 2023-09-14 DIAGNOSIS — N390 Urinary tract infection, site not specified: Secondary | ICD-10-CM | POA: Diagnosis not present

## 2023-09-14 DIAGNOSIS — R509 Fever, unspecified: Secondary | ICD-10-CM | POA: Diagnosis not present

## 2023-09-14 DIAGNOSIS — G629 Polyneuropathy, unspecified: Secondary | ICD-10-CM | POA: Diagnosis not present

## 2023-09-14 DIAGNOSIS — E1165 Type 2 diabetes mellitus with hyperglycemia: Secondary | ICD-10-CM | POA: Diagnosis not present

## 2023-09-14 DIAGNOSIS — I69354 Hemiplegia and hemiparesis following cerebral infarction affecting left non-dominant side: Secondary | ICD-10-CM | POA: Diagnosis not present

## 2023-09-14 DIAGNOSIS — D539 Nutritional anemia, unspecified: Secondary | ICD-10-CM | POA: Diagnosis not present

## 2023-09-14 DIAGNOSIS — M1990 Unspecified osteoarthritis, unspecified site: Secondary | ICD-10-CM | POA: Diagnosis not present

## 2023-09-14 DIAGNOSIS — E785 Hyperlipidemia, unspecified: Secondary | ICD-10-CM | POA: Diagnosis not present

## 2023-09-14 DIAGNOSIS — E860 Dehydration: Secondary | ICD-10-CM | POA: Diagnosis not present

## 2023-09-14 DIAGNOSIS — N3000 Acute cystitis without hematuria: Secondary | ICD-10-CM

## 2023-09-14 DIAGNOSIS — K219 Gastro-esophageal reflux disease without esophagitis: Secondary | ICD-10-CM | POA: Diagnosis not present

## 2023-09-14 DIAGNOSIS — I469 Cardiac arrest, cause unspecified: Secondary | ICD-10-CM | POA: Diagnosis not present

## 2023-09-14 DIAGNOSIS — E782 Mixed hyperlipidemia: Secondary | ICD-10-CM | POA: Diagnosis not present

## 2023-09-14 DIAGNOSIS — G8194 Hemiplegia, unspecified affecting left nondominant side: Secondary | ICD-10-CM | POA: Diagnosis not present

## 2023-09-14 DIAGNOSIS — H04123 Dry eye syndrome of bilateral lacrimal glands: Secondary | ICD-10-CM | POA: Diagnosis not present

## 2023-09-14 DIAGNOSIS — G2581 Restless legs syndrome: Secondary | ICD-10-CM | POA: Diagnosis not present

## 2023-09-14 DIAGNOSIS — Z8673 Personal history of transient ischemic attack (TIA), and cerebral infarction without residual deficits: Secondary | ICD-10-CM | POA: Diagnosis not present

## 2023-09-14 DIAGNOSIS — I739 Peripheral vascular disease, unspecified: Secondary | ICD-10-CM | POA: Diagnosis not present

## 2023-09-14 LAB — BASIC METABOLIC PANEL
Anion gap: 6 (ref 5–15)
BUN: 24 mg/dL — ABNORMAL HIGH (ref 8–23)
CO2: 24 mmol/L (ref 22–32)
Calcium: 8.3 mg/dL — ABNORMAL LOW (ref 8.9–10.3)
Chloride: 108 mmol/L (ref 98–111)
Creatinine, Ser: 1.26 mg/dL — ABNORMAL HIGH (ref 0.61–1.24)
GFR, Estimated: 55 mL/min — ABNORMAL LOW (ref >60)
Glucose, Bld: 155 mg/dL — ABNORMAL HIGH (ref 70–99)
Potassium: 4 mmol/L (ref 3.5–5.1)
Sodium: 138 mmol/L (ref 135–145)

## 2023-09-14 LAB — CBC
HCT: 36.4 % — ABNORMAL LOW (ref 39.0–52.0)
Hemoglobin: 11.3 g/dL — ABNORMAL LOW (ref 13.0–17.0)
MCH: 31.9 pg (ref 26.0–34.0)
MCHC: 31 g/dL (ref 30.0–36.0)
MCV: 102.8 fL — ABNORMAL HIGH (ref 80.0–100.0)
Platelets: 166 10*3/uL (ref 150–400)
RBC: 3.54 MIL/uL — ABNORMAL LOW (ref 4.22–5.81)
RDW: 14.7 % (ref 11.5–15.5)
WBC: 5.4 10*3/uL (ref 4.0–10.5)
nRBC: 0 % (ref 0.0–0.2)

## 2023-09-14 LAB — GLUCOSE, CAPILLARY
Glucose-Capillary: 143 mg/dL — ABNORMAL HIGH (ref 70–99)
Glucose-Capillary: 211 mg/dL — ABNORMAL HIGH (ref 70–99)

## 2023-09-14 LAB — URINE CULTURE: Culture: >=100000 — AB

## 2023-09-14 MED ORDER — CLONAZEPAM 0.5 MG PO TABS: ORAL_TABLET | ORAL | 0 refills | Status: AC

## 2023-09-14 MED ORDER — SULFAMETHOXAZOLE-TRIMETHOPRIM 800-160 MG PO TABS: 1.0000 | ORAL_TABLET | Freq: Two times a day (BID) | ORAL | 0 refills | Status: AC

## 2023-09-14 NOTE — Progress Notes (Signed)
Report called to Mia at [facility]. Aware [facility] is here now for transport.

## 2023-09-14 NOTE — Discharge Summary (Signed)
Physician Discharge Summary   Patient: Barry [name] MRN: [id number] DOB: 18-May-1933  Admit date:     09/11/2023  Discharge date: 09/14/23  Discharge Physician: Barry [name]   PCP: Donita Brooks, MD     Recommendations at discharge:  Follow up with PCP Dr. Tanya Nones for recurrent ESBL UTI within 1 week of discharge from rehab     Discharge Diagnoses: Principal Problem:   Generalized weakness due to UTI (urinary tract infection) Active Problems:   Mixed hyperlipidemia   Essential hypertension   Depression   Stage 3a chronic kidney disease (HCC)   Type 2 diabetes mellitus with hyperglycemia, without long-term current use of insulin (HCC)   Dehydration   Macrocytic anemia   History of stroke   Peripheral neuropathy     Hospital Course: Barry [name] is an 89 y.o. M with HTN, DM, hx stroke who presented with generalized weakness.  In the ER, renal function at baseline, WBC 11K.  CT head normal, but urinalysis with bacteria.  Admitted on fluids, antibiotics.         Generalized weakness due to recurrent ESBL UTI Admitted on antibiotics, treated with Meropenem.  Urine culture growing Bactrim susceptible Klebsiella pneumoniae again.  Discussed with ID.    WBC normalized, temp < 100 F, heart rate < 100bpm, RR < 24.  No CVA tenderness, flank pain, hematuria to suggest stone or abscess.  Discharged to complete 7 more days Bactrim.                 The [facility] Controlled Substances Registry was reviewed for this patient prior to discharge.   Consultants: None Procedures performed: CT head   Disposition: Skilled nursing facility Diet recommendation:  Discharge Diet Orders (From admission, onward)     Start     Ordered   09/14/23 0000  Diet - low sodium heart healthy        09/14/23 1302             DISCHARGE MEDICATION: Allergies as of 09/14/2023       Reactions   Lisinopril-hydrochlorothiazide Swelling   Doxycycline Rash         Medication List     TAKE these medications    acetaminophen 325 MG tablet Commonly known as: TYLENOL Take 2 tablets (650 mg total) by mouth every 6 (six) hours as needed for mild pain (or Fever >/= 101).   amLODipine 10 MG tablet Commonly known as: NORVASC Take 1 tablet (10 mg total) by mouth daily.   artificial tears Oint ophthalmic ointment Commonly known as: LACRILUBE Place into both eyes every 4 (four) hours as needed for dry eyes.   buPROPion 150 MG 24 hr tablet Commonly known as: WELLBUTRIN XL Take 1 tablet (150 mg total) by mouth daily.   clonazePAM 0.5 MG tablet Commonly known as: KLONOPIN TAKE 1/2 TABLET BY MOUTH 3 TIMES DAILY AS NEEDED FOR ANXIETY. What changed:  how much to take how to take this when to take this reasons to take this additional instructions   clopidogrel 75 MG tablet Commonly known as: PLAVIX Take 1 tablet (75 mg total) by mouth daily. tAKE 1 TABLET (75 MG TOTAL) BY MOUTH DAILY. nEED APPOINTMENT WITH PRIMARY CARE PROVIDER FOR FURTHER REFILLS What changed: additional instructions   cyanocobalamin 1000 MCG tablet Commonly known as: VITAMIN B12 Take 1 tablet (1,000 mcg total) by mouth daily.   ezetimibe 10 MG tablet Commonly known as: ZETIA Take 1 tablet (10 mg total) by  mouth daily.   FLUoxetine 20 MG capsule Commonly known as: PROZAC TAKE (1) CAPSULE BY MOUTH EACH MORNING. What changed:  how much to take how to take this when to take this   gabapentin 300 MG capsule Commonly known as: NEURONTIN Take 1 capsule (300 mg total) by mouth 2 (two) times daily. What changed:  when to take this reasons to take this   glipiZIDE 10 MG 24 hr tablet Commonly known as: glipiZIDE XL Take 1 tablet (10 mg total) by mouth daily with breakfast.   hydrALAZINE 100 MG tablet Commonly known as: APRESOLINE Take 1 tablet (100 mg total) by mouth every 8 (eight) hours.   metoprolol succinate 25 MG 24 hr tablet Commonly known as:  TOPROL-XL Take 0.5 tablets (12.5 mg total) by mouth daily.   montelukast 10 MG tablet Commonly known as: SINGULAIR TAKE 1 TABLET (10 MG TOTAL) BY MOUTH AT BEDTIME.   pioglitazone 30 MG tablet Commonly known as: ACTOS Take 1 tablet (30 mg total) by mouth daily.   rosuvastatin 20 MG tablet Commonly known as: CRESTOR Take 1 tablet (20 mg total) by mouth daily.   sulfamethoxazole-trimethoprim 800-160 MG tablet Commonly known as: BACTRIM DS Take 1 tablet by mouth 2 (two) times daily for 7 days.   valsartan 160 MG tablet Commonly known as: DIOVAN Take 1 tablet (160 mg total) by mouth 2 (two) times daily.        Contact information for follow-up providers     Donita Brooks, MD Follow up.   Specialty: Family Medicine Contact information: [address] [place] [address] [phone]              Contact information for after-discharge care     Destination     [facility] [place] Preferred SNF .   Service: Skilled Nursing Contact information: [address] [place] [address] [phone]                     Discharge Instructions     Diet - low sodium heart healthy   Complete by: As directed    Increase activity slowly   Complete by: As directed        Discharge Exam: Filed Weights   09/11/23 1232  Weight: 80.3 kg    General: Pt is alert, awake, not in acute distress Cardiovascular: RRR, nl S1-S2, no murmurs appreciated.   No LE edema.   Respiratory: Normal respiratory rate and rhythm.  CTAB without rales or wheezes. Abdominal: Abdomen soft and non-tender.  No distension or HSM.   Neuro/Psych: Strength symmetric in upper and lower extremities.  Judgment and insight appear at baseline.   Condition at discharge: fair  The results of significant diagnostics from this hospitalization (including imaging, microbiology, ancillary and laboratory) are listed below for reference.   Imaging  Studies: CT Head Wo Contrast  Result Date: 09/11/2023 CLINICAL DATA:  Weakness and fall. EXAM: CT HEAD WITHOUT CONTRAST TECHNIQUE: Contiguous axial images were obtained from the base of the skull through the vertex without intravenous contrast. RADIATION DOSE REDUCTION: This exam was performed according to the departmental dose-optimization program which includes automated exposure control, adjustment of the mA and/or kV according to patient size and/or use of iterative reconstruction technique. COMPARISON:  CT head dated March 21, 2023. FINDINGS: Brain: No evidence of acute infarction, hemorrhage, hydrocephalus, extra-axial collection or mass lesion/mass effect. Stable atrophy and advanced chronic microvascular ischemic changes. Vascular: Calcified atherosclerosis at the  skull base. No hyperdense vessel. Skull: Normal. Negative for fracture or focal lesion. Sinuses/Orbits: No acute finding. Other: None. IMPRESSION: 1. No acute intracranial abnormality. 2. Stable atrophy and advanced chronic microvascular ischemic changes. Electronically Signed   By: Obie Dredge M.D.   On: 09/11/2023 18:29    Microbiology: Results for orders placed or performed during the hospital encounter of 09/11/23  Urine Culture     Status: Abnormal   Collection Time: 09/11/23  8:24 PM   Specimen: Urine, Random  Result Value Ref Range Status   Specimen Description   Final    URINE, RANDOM Performed at [facility], [address] [place], [place] [address]    Special Requests   Final    NONE Reflexed from [id number] Performed at [facility], [address] [place], [place] [address]    Culture (A)  Final    >=100,000 COLONIES/mL KLEBSIELLA PNEUMONIAE Confirmed Extended Spectrum Beta-Lactamase Producer (ESBL).  In bloodstream infections from ESBL organisms, carbapenems are preferred over piperacillin/tazobactam. They are shown to have a lower risk of mortality.    Report Status  09/14/2023 FINAL  Final   Organism ID, Bacteria KLEBSIELLA PNEUMONIAE (A)  Final      Susceptibility   Klebsiella pneumoniae - MIC*    AMPICILLIN >=32 RESISTANT Resistant     CEFAZOLIN >=64 RESISTANT Resistant     CEFEPIME >=32 RESISTANT Resistant     CEFTRIAXONE >=64 RESISTANT Resistant     CIPROFLOXACIN 0.5 INTERMEDIATE Intermediate     GENTAMICIN <=1 SENSITIVE Sensitive     IMIPENEM <=0.25 SENSITIVE Sensitive     NITROFURANTOIN 128 RESISTANT Resistant     TRIMETH/SULFA <=20 SENSITIVE Sensitive     AMPICILLIN/SULBACTAM 16 INTERMEDIATE Intermediate     PIP/TAZO <=4 SENSITIVE Sensitive ug/mL    * >=100,000 COLONIES/mL KLEBSIELLA PNEUMONIAE    Labs: CBC: Recent Labs  Lab 09/11/23 1233 09/12/23 0446 09/14/23 0829  WBC 13.7* 11.1* 5.4  HGB 11.6* 10.6* 11.3*  HCT 36.1* 33.7* 36.4*  MCV 101.1* 102.1* 102.8*  PLT 173 146* 166   Basic Metabolic Panel: Recent Labs  Lab 09/11/23 1233 09/12/23 0446 09/14/23 0829  NA 133* 135 138  K 4.5 4.6 4.0  CL 103 107 108  CO2 25 25 24   GLUCOSE 242* 120* 155*  BUN 34* 35* 24*  CREATININE 1.47* 1.47* 1.26*  CALCIUM 8.5* 8.2* 8.3*  MG  --  2.0  --   PHOS  --  3.2  --    Liver Function Tests: Recent Labs  Lab 09/12/23 0446  AST 13*  ALT 13  ALKPHOS 26*  BILITOT 0.4  PROT 5.0*  ALBUMIN 2.5*   CBG: Recent Labs  Lab 09/13/23 1325 09/13/23 1723 09/13/23 2124 09/14/23 0726 09/14/23 1111  GLUCAP 258* 209* 153* 143* 211*    Discharge time spent: approximately 35 minutes spent on discharge counseling, evaluation of patient on day of discharge, and coordination of discharge planning with nursing, social work, pharmacy and case management  Signed: Alberteen Sam, MD [facility] 09/14/2023

## 2023-09-14 NOTE — TOC Transition Note (Signed)
Transition of Care [facility]) - CM/SW Discharge Note   Patient Details  Name: Barry [name] MRN: [id number] Date of Birth: 1932/12/02  [facility] CM/SW Contact:  Otelia Santee, LCSW Phone Number: 09/14/2023, 1:27 PM   Clinical Narrative:    Pt to transfer to [facility] for SNF placement. Pt will be going to room 101B. RN to call report to [phone]. DC packet placed at RN station. PTAR called at 1:43pm for transportation.    Final next level of care: Skilled Nursing Facility Barriers to Discharge: No Barriers Identified   Patient Goals and CMS Choice CMS Medicare.gov Compare Post Acute Care list provided to:: Patient Choice offered to / list presented to : Patient, [facility] POA / Guardian  Discharge Placement     Existing PASRR number confirmed : 09/12/23          Patient chooses bed at: [facility], [place] Patient to be transferred to facility by: [facility] Name of family member notified: Patient and son Patient and family notified of of transfer: 09/14/23  Discharge Plan and Services Additional resources added to the After Visit Summary for   In-house Referral: Clinical Social Work Discharge Planning Services: NA Post Acute Care Choice: Skilled Nursing Facility          DME Arranged: N/A DME Agency: NA                  Social Determinants of Health (SDOH) Interventions SDOH Screenings   Food Insecurity: No Food Insecurity (09/11/2023)  Housing: Low Risk  (09/11/2023)  Transportation Needs: No Transportation Needs (09/11/2023)  Utilities: Not At Risk (09/11/2023)  Alcohol Screen: Low Risk  (01/06/2023)  Depression (PHQ2-9): Low Risk  (08/26/2023)  Financial Resource Strain: Low Risk  (01/06/2023)  Physical Activity: Inactive (01/06/2023)  Social Connections: Moderately Isolated (01/06/2023)  Stress: No Stress Concern Present (01/06/2023)  Tobacco Use: Medium Risk (09/11/2023)     Readmission Risk Interventions    09/12/2023   12:14 PM  07/29/2021    2:57 PM  Readmission Risk Prevention Plan  Medication Screening  Complete  Transportation Screening Complete Complete  PCP or Specialist Appt within 3-5 Days Complete   HRI or Home Care Consult Complete   Social Work Consult for Recovery Care Planning/Counseling Complete   Palliative Care Screening Not Applicable   Medication Review Oceanographer) Complete

## 2023-09-18 DIAGNOSIS — Z8673 Personal history of transient ischemic attack (TIA), and cerebral infarction without residual deficits: Secondary | ICD-10-CM | POA: Diagnosis not present

## 2023-09-18 DIAGNOSIS — F419 Anxiety disorder, unspecified: Secondary | ICD-10-CM | POA: Diagnosis not present

## 2023-09-18 DIAGNOSIS — J449 Chronic obstructive pulmonary disease, unspecified: Secondary | ICD-10-CM | POA: Diagnosis not present

## 2023-09-18 DIAGNOSIS — E1151 Type 2 diabetes mellitus with diabetic peripheral angiopathy without gangrene: Secondary | ICD-10-CM | POA: Diagnosis not present

## 2023-09-18 DIAGNOSIS — E785 Hyperlipidemia, unspecified: Secondary | ICD-10-CM | POA: Diagnosis not present

## 2023-09-18 DIAGNOSIS — N390 Urinary tract infection, site not specified: Secondary | ICD-10-CM | POA: Diagnosis not present

## 2023-09-18 DIAGNOSIS — I10 Essential (primary) hypertension: Secondary | ICD-10-CM | POA: Diagnosis not present

## 2023-09-18 DIAGNOSIS — F32A Depression, unspecified: Secondary | ICD-10-CM | POA: Diagnosis not present

## 2023-09-18 DIAGNOSIS — K219 Gastro-esophageal reflux disease without esophagitis: Secondary | ICD-10-CM | POA: Diagnosis not present

## 2023-09-26 ENCOUNTER — Other Ambulatory Visit: Payer: Self-pay | Admitting: *Deleted

## 2023-09-26 NOTE — Patient Outreach (Signed)
Barry [name] resides in [facility]. Screening for potential complex care management services as benefit of health plan and PCP.   Previous update received from [place], [place] social worker. Possible transition plan is  for LTC.   Will continue to follow for confirmation disposition plans/needs.   Raiford Noble, MSN, RN, BSN [place]  [facility], [facility] Commun[facility] RN Post- Acute Care Manager Direct Dial: [phone]

## 2023-10-09 DIAGNOSIS — R509 Fever, unspecified: Secondary | ICD-10-CM | POA: Diagnosis not present

## 2023-10-09 DIAGNOSIS — G8194 Hemiplegia, unspecified affecting left nondominant side: Secondary | ICD-10-CM | POA: Diagnosis not present

## 2023-10-09 DIAGNOSIS — R410 Disorientation, unspecified: Secondary | ICD-10-CM | POA: Diagnosis not present

## 2023-10-09 DIAGNOSIS — E1151 Type 2 diabetes mellitus with diabetic peripheral angiopathy without gangrene: Secondary | ICD-10-CM | POA: Diagnosis not present

## 2023-10-10 DIAGNOSIS — R0602 Shortness of breath: Secondary | ICD-10-CM | POA: Diagnosis not present

## 2023-10-21 DIAGNOSIS — E1100 Type 2 diabetes mellitus with hyperosmolarity without nonketotic hyperglycemic-hyperosmolar coma (NKHHC): Secondary | ICD-10-CM | POA: Diagnosis not present

## 2023-10-21 DIAGNOSIS — D649 Anemia, unspecified: Secondary | ICD-10-CM | POA: Diagnosis not present

## 2023-11-07 ENCOUNTER — Other Ambulatory Visit: Payer: Self-pay | Admitting: *Deleted

## 2023-11-07 NOTE — Patient Outreach (Signed)
Post-Acute Care Manager follow up. Mr. Flint resides in [facility] [place] [facility].  Update received from [employer]. Mr. Faustini has transitioned to LTC.   No identifiable chronic care management needs.  Raiford Noble, MSN, RN, BSN [place]  [facility], Healthy Communities RN Post- Acute Care Manager Direct Dial: [phone]

## 2023-11-13 DIAGNOSIS — D649 Anemia, unspecified: Secondary | ICD-10-CM | POA: Diagnosis not present

## 2023-11-13 DIAGNOSIS — R600 Localized edema: Secondary | ICD-10-CM | POA: Diagnosis not present

## 2023-11-13 DIAGNOSIS — R2681 Unsteadiness on feet: Secondary | ICD-10-CM | POA: Diagnosis not present

## 2023-11-14 DIAGNOSIS — E1165 Type 2 diabetes mellitus with hyperglycemia: Secondary | ICD-10-CM | POA: Diagnosis not present

## 2023-11-14 DIAGNOSIS — I10 Essential (primary) hypertension: Secondary | ICD-10-CM | POA: Diagnosis not present

## 2023-11-14 DIAGNOSIS — J449 Chronic obstructive pulmonary disease, unspecified: Secondary | ICD-10-CM | POA: Diagnosis not present

## 2023-11-14 DIAGNOSIS — I509 Heart failure, unspecified: Secondary | ICD-10-CM | POA: Diagnosis not present

## 2023-11-16 DIAGNOSIS — M7989 Other specified soft tissue disorders: Secondary | ICD-10-CM | POA: Diagnosis not present

## 2023-11-20 DIAGNOSIS — C44229 Squamous cell carcinoma of skin of left ear and external auricular canal: Secondary | ICD-10-CM | POA: Diagnosis not present

## 2023-11-20 DIAGNOSIS — R600 Localized edema: Secondary | ICD-10-CM | POA: Diagnosis not present

## 2023-11-25 DIAGNOSIS — C44219 Basal cell carcinoma of skin of left ear and external auricular canal: Secondary | ICD-10-CM | POA: Diagnosis not present

## 2023-12-02 DIAGNOSIS — E1165 Type 2 diabetes mellitus with hyperglycemia: Secondary | ICD-10-CM | POA: Diagnosis not present

## 2023-12-02 DIAGNOSIS — E1100 Type 2 diabetes mellitus with hyperosmolarity without nonketotic hyperglycemic-hyperosmolar coma (NKHHC): Secondary | ICD-10-CM | POA: Diagnosis not present

## 2023-12-02 DIAGNOSIS — E559 Vitamin D deficiency, unspecified: Secondary | ICD-10-CM | POA: Diagnosis not present

## 2023-12-02 DIAGNOSIS — D649 Anemia, unspecified: Secondary | ICD-10-CM | POA: Diagnosis not present

## 2023-12-06 ENCOUNTER — Encounter: Payer: Self-pay | Admitting: Family Medicine

## 2023-12-07 DIAGNOSIS — E782 Mixed hyperlipidemia: Secondary | ICD-10-CM | POA: Diagnosis not present

## 2023-12-07 DIAGNOSIS — R2681 Unsteadiness on feet: Secondary | ICD-10-CM | POA: Diagnosis not present

## 2023-12-07 DIAGNOSIS — M6281 Muscle weakness (generalized): Secondary | ICD-10-CM | POA: Diagnosis not present

## 2023-12-08 DIAGNOSIS — R2681 Unsteadiness on feet: Secondary | ICD-10-CM | POA: Diagnosis not present

## 2023-12-08 DIAGNOSIS — E782 Mixed hyperlipidemia: Secondary | ICD-10-CM | POA: Diagnosis not present

## 2023-12-08 DIAGNOSIS — M6281 Muscle weakness (generalized): Secondary | ICD-10-CM | POA: Diagnosis not present

## 2023-12-09 DIAGNOSIS — R2681 Unsteadiness on feet: Secondary | ICD-10-CM | POA: Diagnosis not present

## 2023-12-09 DIAGNOSIS — E782 Mixed hyperlipidemia: Secondary | ICD-10-CM | POA: Diagnosis not present

## 2023-12-09 DIAGNOSIS — M6281 Muscle weakness (generalized): Secondary | ICD-10-CM | POA: Diagnosis not present

## 2023-12-12 DIAGNOSIS — R2681 Unsteadiness on feet: Secondary | ICD-10-CM | POA: Diagnosis not present

## 2023-12-12 DIAGNOSIS — M6281 Muscle weakness (generalized): Secondary | ICD-10-CM | POA: Diagnosis not present

## 2023-12-12 DIAGNOSIS — E782 Mixed hyperlipidemia: Secondary | ICD-10-CM | POA: Diagnosis not present

## 2023-12-13 ENCOUNTER — Ambulatory Visit (INDEPENDENT_AMBULATORY_CARE_PROVIDER_SITE_OTHER): Payer: Medicare Other | Admitting: Family Medicine

## 2023-12-13 ENCOUNTER — Encounter: Payer: Self-pay | Admitting: Family Medicine

## 2023-12-13 VITALS — BP 132/60 | HR 63 | Temp 97.7°F | Ht 70.0 in | Wt 177.0 lb

## 2023-12-13 DIAGNOSIS — G8929 Other chronic pain: Secondary | ICD-10-CM

## 2023-12-13 DIAGNOSIS — M25511 Pain in right shoulder: Secondary | ICD-10-CM

## 2023-12-13 DIAGNOSIS — M6281 Muscle weakness (generalized): Secondary | ICD-10-CM | POA: Diagnosis not present

## 2023-12-13 DIAGNOSIS — M25512 Pain in left shoulder: Secondary | ICD-10-CM

## 2023-12-13 DIAGNOSIS — R2681 Unsteadiness on feet: Secondary | ICD-10-CM | POA: Diagnosis not present

## 2023-12-13 DIAGNOSIS — E782 Mixed hyperlipidemia: Secondary | ICD-10-CM | POA: Diagnosis not present

## 2023-12-13 MED ORDER — TRIAMCINOLONE ACETONIDE 40 MG/ML IJ SUSP
80.0000 mg | Freq: Once | INTRAMUSCULAR | Status: AC
  Administered 2023-12-13: 80 mg via INTRA_ARTICULAR

## 2023-12-13 NOTE — Addendum Note (Signed)
 Addended by: Darral Dash on: 12/13/2023 12:39 PM   Modules accepted: Orders

## 2023-12-13 NOTE — Progress Notes (Signed)
 Subjective:    Patient ID: Barry [name], male    DOB: June 07, 1933, 89 y.o.   MRN: [id number]  Patient has a history of chronic shoulder pain especially in his left shoulder.  He has received several cortisone injection in his left shoulder.  He receives significant benefit with each cortisone injection.  He report pain with abduction greater than 90.  He is only received physical therapy at rehab facility and he is having significant pain in his shoulders with overhead activity and range of motion.  It also causes him pain when he tries to sleep at night.  He is having similar pain in his right shoulder.  Overhead activity irritates his right shoulder as well.  He is requesting a cortisone injection in both shoulders today. Past Medical History:  Diagnosis Date   AAA (abdominal aortic aneurysm) (HCC) 12/17/2007   4.7 cm   Allergy    rhinitis   Anxiety    Cancer (HCC)    skin   Cataract    Depression    Diabetes mellitus    Diverticulosis    GERD (gastroesophageal reflux disease)    Hepatitis    Hx of colonic polyps 08/26/2015   Hyperlipidemia    Hypertension    Nephrolithiasis    Personal history of colonic polyps    adenomas, serrated also   PVD (peripheral vascular disease) (HCC)    Stroke (HCC)    Tobacco abuse    Past Surgical History:  Procedure Laterality Date   COLONOSCOPY  2006, 2008, 2010, 05/27/2011   numerous adenomas - 13 in 2006, 3, 2008, 2 2012 (up to 1 cm), 4 diminutive adenomas, serrated adenomas 2012. Diverticulosis and hemorrhoids also.   EYE SURGERY     EYE SURGERY Left    Kidney stone operation     lower limb amputation, other toe 5th     percutaneous stent graft repair of infrarenal AAA  11/10   5.6 cm (T. early)   surgical excision basal cell carcinoma     TEE WITHOUT CARDIOVERSION N/A 07/29/2021   Procedure: TRANSESOPHAGEAL ECHOCARDIOGRAM (TEE);  Surgeon: Sande Rives, MD;  Location: [facility];  Service: Cardiovascular;  Laterality: N/A;    tympanic eardrum repair     VASECTOMY     Current Outpatient Medications on File Prior to Visit  Medication Sig Dispense Refill   acetaminophen (TYLENOL) 325 MG tablet Take 2 tablets (650 mg total) by mouth every 6 (six) hours as needed for mild pain (or Fever >/= 101).     amLODipine (NORVASC) 10 MG tablet Take 1 tablet (10 mg total) by mouth daily. 90 tablet 3   artificial tears (LACRILUBE) OINT ophthalmic ointment Place into both eyes every 4 (four) hours as needed for dry eyes.     buPROPion (WELLBUTRIN XL) 150 MG 24 hr tablet Take 1 tablet (150 mg total) by mouth daily. 90 tablet 3   clonazePAM (KLONOPIN) 0.5 MG tablet TAKE 1/2 TABLET BY MOUTH 3 TIMES DAILY AS NEEDED FOR ANXIETY. 5 tablet 0   clopidogrel (PLAVIX) 75 MG tablet Take 1 tablet (75 mg total) by mouth daily. tAKE 1 TABLET (75 MG TOTAL) BY MOUTH DAILY. nEED APPOINTMENT WITH PRIMARY CARE PROVIDER FOR FURTHER REFILLS (Patient taking differently: Take 75 mg by mouth daily.) 90 tablet 3   cyanocobalamin (VITAMIN B12) 1000 MCG tablet Take 1 tablet (1,000 mcg total) by mouth daily. 90 tablet 3   ezetimibe (ZETIA) 10 MG tablet Take 1 tablet (10 mg total) by  mouth daily. 90 tablet 3   FLUoxetine (PROZAC) 20 MG capsule TAKE (1) CAPSULE BY MOUTH EACH MORNING. (Patient taking differently: Take 20 mg by mouth daily. TAKE (1) CAPSULE BY MOUTH EACH MORNING.) 90 capsule 1   gabapentin (NEURONTIN) 300 MG capsule Take 1 capsule (300 mg total) by mouth 2 (two) times daily. (Patient taking differently: Take 300 mg by mouth 2 (two) times daily as needed (nerve pain).) 180 capsule 3   glipiZIDE (GLIPIZIDE XL) 10 MG 24 hr tablet Take 1 tablet (10 mg total) by mouth daily with breakfast. 90 tablet 3   hydrALAZINE (APRESOLINE) 100 MG tablet Take 1 tablet (100 mg total) by mouth every 8 (eight) hours. 90 tablet 9   metoprolol succinate (TOPROL-XL) 25 MG 24 hr tablet Take 0.5 tablets (12.5 mg total) by mouth daily. 45 tablet 3   montelukast (SINGULAIR) 10 MG  tablet TAKE 1 TABLET (10 MG TOTAL) BY MOUTH AT BEDTIME. 90 tablet 1   pioglitazone (ACTOS) 30 MG tablet Take 1 tablet (30 mg total) by mouth daily. 90 tablet 3   rosuvastatin (CRESTOR) 20 MG tablet Take 1 tablet (20 mg total) by mouth daily. 90 tablet 3   valsartan (DIOVAN) 160 MG tablet Take 1 tablet (160 mg total) by mouth 2 (two) times daily. 180 tablet 3   No current facility-administered medications on file prior to visit.   Allergies  Allergen Reactions   Lisinopril-Hydrochlorothiazide Swelling   Doxycycline Rash   Social History   Socioeconomic History   Marital status: Widowed    Spouse name: Not on file   Number of children: 2   Years of education: Not on file   Highest education level: Not on file  Occupational History    Employer: RETIRED  Tobacco Use   Smoking status: Former    Current packs/day: 1.00    Average packs/day: 1 pack/day for 60.0 years (60.0 ttl pk-yrs)    Types: Cigarettes   Smokeless tobacco: Never   Tobacco comments:    Hasn't smoked since he had his stroke   Vaping Use   Vaping status: Never Used  Substance and Sexual Activity   Alcohol use: No   Drug use: No   Sexual activity: Yes    Partners: Female    Birth control/protection: Surgical    Comment: vasectomy  Other Topics Concern   Not on file  Social History Narrative   11/11/21 living with son, [name]   HSG, 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 Lives alone. 1- step-daughter, 1 son - '68; 3 grandchildren. 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)   Overall Financial Resource Strain (CARDIA)    Difficulty of Paying Living Expenses: Not hard at all  Food Insecurity: No Food Insecurity (09/11/2023)   Hunger Vital Sign    Worried About Running Out of Food in the Last Year: Never true    Ran Out of Food in the Last Year: Never true  Transportation Needs: No Transportation Needs (09/11/2023)    PRAPARE - Administrator, Civil Service (Medical): No    Lack of Transportation (Non-Medical): No  Physical Activity: Inactive (01/06/2023)   Exercise Vital Sign    Days of Exercise per Week: 0 days    Minutes of Exercise per Session: 0 min  Stress: No Stress Concern Present (01/06/2023)   [employer] of Occupational Health - Occupational Stress Questionnaire  Feeling of Stress : Only a little  Social Connections: Moderately Isolated (01/06/2023)   Social Connection and Isolation Panel [NHANES]    Frequency of Communication with Friends and Family: More than three times a week    Frequency of Social Gatherings with Friends and Family: More than three times a week    Attends Religious Services: 1 to 4 times per year    Active Member of [employer] or Organizations: No    Attends Banker Meetings: Never    Marital Status: Widowed  Intimate Partner Violence: Not At Risk (09/11/2023)   Humiliation, Afraid, Rape, and Kick questionnaire    Fear of Current or Ex-Partner: No    Emotionally Abused: No    Physically Abused: No    Sexually Abused: No     Review of Systems  Neurological:  Positive for dizziness.  All other systems reviewed and are negative.      Objective:   Physical Exam Vitals reviewed.  Constitutional:      General: He is not in acute distress.    Appearance: He is not toxic-appearing.  Cardiovascular:     Rate and Rhythm: Normal rate.     Heart sounds: Murmur heard.  Pulmonary:     Effort: Pulmonary effort is normal.     Breath sounds: Normal breath sounds. No wheezing.  Musculoskeletal:     Right shoulder: Tenderness and crepitus present. No bony tenderness. Decreased range of motion.     Left shoulder: Tenderness and crepitus present. No bony tenderness. Decreased range of motion.     Right lower leg: No edema.     Left lower leg: No edema.  Neurological:     General: No focal deficit present.     Mental Status: He is alert and  oriented to person, place, and time.     Cranial Nerves: No cranial nerve deficit.     Sensory: No sensory deficit.     Coordination: Coordination normal.     Gait: Gait abnormal.     Deep Tendon Reflexes: Reflexes normal.           Assessment & Plan:  Chronic pain of both shoulders  Using sterile technique, I injected his left shoulder with 2 cc lidocaine, 2 cc of Marcaine, and 2 cc of 40 mg/mL Kenalog.  The patient tolerated the procedure well.  I then injected his right shoulder with 2 cc of lidocaine, 2 cc of Marcaine, 2 cc of 40 mg/mL Kenalog.  Patient tolerated this procedure well.  I believe his shoulder pain is due to chronic tendinosis of the the supraspinatus bilaterally coupled with chronic impingement syndrome and likely arthritis.  He receives subjective pain relief from the injections.  We can injections every 3 months as needed

## 2023-12-14 DIAGNOSIS — E782 Mixed hyperlipidemia: Secondary | ICD-10-CM | POA: Diagnosis not present

## 2023-12-14 DIAGNOSIS — M6281 Muscle weakness (generalized): Secondary | ICD-10-CM | POA: Diagnosis not present

## 2023-12-14 DIAGNOSIS — R2681 Unsteadiness on feet: Secondary | ICD-10-CM | POA: Diagnosis not present

## 2023-12-15 DIAGNOSIS — R2681 Unsteadiness on feet: Secondary | ICD-10-CM | POA: Diagnosis not present

## 2023-12-15 DIAGNOSIS — M6281 Muscle weakness (generalized): Secondary | ICD-10-CM | POA: Diagnosis not present

## 2023-12-15 DIAGNOSIS — E782 Mixed hyperlipidemia: Secondary | ICD-10-CM | POA: Diagnosis not present

## 2023-12-19 DIAGNOSIS — E782 Mixed hyperlipidemia: Secondary | ICD-10-CM | POA: Diagnosis not present

## 2023-12-19 DIAGNOSIS — M6281 Muscle weakness (generalized): Secondary | ICD-10-CM | POA: Diagnosis not present

## 2023-12-19 DIAGNOSIS — R2681 Unsteadiness on feet: Secondary | ICD-10-CM | POA: Diagnosis not present

## 2023-12-21 DIAGNOSIS — E782 Mixed hyperlipidemia: Secondary | ICD-10-CM | POA: Diagnosis not present

## 2023-12-21 DIAGNOSIS — R2681 Unsteadiness on feet: Secondary | ICD-10-CM | POA: Diagnosis not present

## 2023-12-21 DIAGNOSIS — M6281 Muscle weakness (generalized): Secondary | ICD-10-CM | POA: Diagnosis not present

## 2023-12-22 DIAGNOSIS — M6281 Muscle weakness (generalized): Secondary | ICD-10-CM | POA: Diagnosis not present

## 2023-12-22 DIAGNOSIS — E782 Mixed hyperlipidemia: Secondary | ICD-10-CM | POA: Diagnosis not present

## 2023-12-22 DIAGNOSIS — R2681 Unsteadiness on feet: Secondary | ICD-10-CM | POA: Diagnosis not present

## 2023-12-25 DIAGNOSIS — G8194 Hemiplegia, unspecified affecting left nondominant side: Secondary | ICD-10-CM | POA: Diagnosis not present

## 2023-12-25 DIAGNOSIS — F419 Anxiety disorder, unspecified: Secondary | ICD-10-CM | POA: Diagnosis not present

## 2023-12-25 DIAGNOSIS — C44219 Basal cell carcinoma of skin of left ear and external auricular canal: Secondary | ICD-10-CM | POA: Diagnosis not present

## 2023-12-25 DIAGNOSIS — E1151 Type 2 diabetes mellitus with diabetic peripheral angiopathy without gangrene: Secondary | ICD-10-CM | POA: Diagnosis not present

## 2023-12-25 DIAGNOSIS — R600 Localized edema: Secondary | ICD-10-CM | POA: Diagnosis not present

## 2023-12-25 DIAGNOSIS — I10 Essential (primary) hypertension: Secondary | ICD-10-CM | POA: Diagnosis not present

## 2023-12-26 DIAGNOSIS — E782 Mixed hyperlipidemia: Secondary | ICD-10-CM | POA: Diagnosis not present

## 2023-12-26 DIAGNOSIS — R2681 Unsteadiness on feet: Secondary | ICD-10-CM | POA: Diagnosis not present

## 2023-12-26 DIAGNOSIS — M6281 Muscle weakness (generalized): Secondary | ICD-10-CM | POA: Diagnosis not present

## 2023-12-27 DIAGNOSIS — M6281 Muscle weakness (generalized): Secondary | ICD-10-CM | POA: Diagnosis not present

## 2023-12-27 DIAGNOSIS — R2681 Unsteadiness on feet: Secondary | ICD-10-CM | POA: Diagnosis not present

## 2023-12-27 DIAGNOSIS — E782 Mixed hyperlipidemia: Secondary | ICD-10-CM | POA: Diagnosis not present

## 2023-12-28 DIAGNOSIS — M546 Pain in thoracic spine: Secondary | ICD-10-CM | POA: Diagnosis not present

## 2023-12-28 DIAGNOSIS — M545 Low back pain, unspecified: Secondary | ICD-10-CM | POA: Diagnosis not present

## 2024-01-01 DIAGNOSIS — M549 Dorsalgia, unspecified: Secondary | ICD-10-CM | POA: Diagnosis not present

## 2024-01-02 DIAGNOSIS — R2681 Unsteadiness on feet: Secondary | ICD-10-CM | POA: Diagnosis not present

## 2024-01-02 DIAGNOSIS — E782 Mixed hyperlipidemia: Secondary | ICD-10-CM | POA: Diagnosis not present

## 2024-01-02 DIAGNOSIS — M6281 Muscle weakness (generalized): Secondary | ICD-10-CM | POA: Diagnosis not present

## 2024-01-04 ENCOUNTER — Other Ambulatory Visit: Payer: Self-pay | Admitting: Internal Medicine

## 2024-01-04 DIAGNOSIS — M545 Low back pain, unspecified: Secondary | ICD-10-CM

## 2024-01-06 DIAGNOSIS — L602 Onychogryphosis: Secondary | ICD-10-CM | POA: Diagnosis not present

## 2024-01-06 DIAGNOSIS — L603 Nail dystrophy: Secondary | ICD-10-CM | POA: Diagnosis not present

## 2024-01-06 DIAGNOSIS — Z7984 Long term (current) use of oral hypoglycemic drugs: Secondary | ICD-10-CM | POA: Diagnosis not present

## 2024-01-06 DIAGNOSIS — E1151 Type 2 diabetes mellitus with diabetic peripheral angiopathy without gangrene: Secondary | ICD-10-CM | POA: Diagnosis not present

## 2024-01-10 ENCOUNTER — Ambulatory Visit
Admission: RE | Admit: 2024-01-10 | Discharge: 2024-01-10 | Disposition: A | Discharge status: Home / Self Care | Source: Ambulatory Visit | Attending: Internal Medicine | Admitting: Internal Medicine

## 2024-01-10 DIAGNOSIS — M545 Low back pain, unspecified: Secondary | ICD-10-CM

## 2024-01-10 DIAGNOSIS — M47816 Spondylosis without myelopathy or radiculopathy, lumbar region: Secondary | ICD-10-CM | POA: Diagnosis not present

## 2024-01-10 DIAGNOSIS — M48061 Spinal stenosis, lumbar region without neurogenic claudication: Secondary | ICD-10-CM | POA: Diagnosis not present

## 2024-01-17 DIAGNOSIS — H04123 Dry eye syndrome of bilateral lacrimal glands: Secondary | ICD-10-CM | POA: Diagnosis not present

## 2024-01-17 DIAGNOSIS — Z961 Presence of intraocular lens: Secondary | ICD-10-CM | POA: Diagnosis not present

## 2024-01-17 DIAGNOSIS — R109 Unspecified abdominal pain: Secondary | ICD-10-CM | POA: Diagnosis not present

## 2024-01-17 DIAGNOSIS — F0390 Unspecified dementia without behavioral disturbance: Secondary | ICD-10-CM | POA: Diagnosis not present

## 2024-01-17 DIAGNOSIS — H524 Presbyopia: Secondary | ICD-10-CM | POA: Diagnosis not present

## 2024-01-17 DIAGNOSIS — E119 Type 2 diabetes mellitus without complications: Secondary | ICD-10-CM | POA: Diagnosis not present

## 2024-01-23 DIAGNOSIS — F0150 Vascular dementia without behavioral disturbance: Secondary | ICD-10-CM | POA: Diagnosis not present

## 2024-01-23 DIAGNOSIS — F331 Major depressive disorder, recurrent, moderate: Secondary | ICD-10-CM | POA: Diagnosis not present

## 2024-01-23 DIAGNOSIS — F411 Generalized anxiety disorder: Secondary | ICD-10-CM | POA: Diagnosis not present

## 2024-01-23 DIAGNOSIS — E1165 Type 2 diabetes mellitus with hyperglycemia: Secondary | ICD-10-CM | POA: Diagnosis not present

## 2024-01-23 DIAGNOSIS — F5101 Primary insomnia: Secondary | ICD-10-CM | POA: Diagnosis not present

## 2024-01-23 DIAGNOSIS — Z8673 Personal history of transient ischemic attack (TIA), and cerebral infarction without residual deficits: Secondary | ICD-10-CM | POA: Diagnosis not present

## 2024-01-23 DIAGNOSIS — R1312 Dysphagia, oropharyngeal phase: Secondary | ICD-10-CM | POA: Diagnosis not present

## 2024-01-24 DIAGNOSIS — R1312 Dysphagia, oropharyngeal phase: Secondary | ICD-10-CM | POA: Diagnosis not present

## 2024-01-24 DIAGNOSIS — Z8673 Personal history of transient ischemic attack (TIA), and cerebral infarction without residual deficits: Secondary | ICD-10-CM | POA: Diagnosis not present

## 2024-01-24 DIAGNOSIS — E1165 Type 2 diabetes mellitus with hyperglycemia: Secondary | ICD-10-CM | POA: Diagnosis not present

## 2024-01-27 DIAGNOSIS — R1312 Dysphagia, oropharyngeal phase: Secondary | ICD-10-CM | POA: Diagnosis not present

## 2024-01-27 DIAGNOSIS — Z8673 Personal history of transient ischemic attack (TIA), and cerebral infarction without residual deficits: Secondary | ICD-10-CM | POA: Diagnosis not present

## 2024-01-27 DIAGNOSIS — E1165 Type 2 diabetes mellitus with hyperglycemia: Secondary | ICD-10-CM | POA: Diagnosis not present

## 2024-01-28 DIAGNOSIS — E1151 Type 2 diabetes mellitus with diabetic peripheral angiopathy without gangrene: Secondary | ICD-10-CM | POA: Diagnosis not present

## 2024-01-28 DIAGNOSIS — C44219 Basal cell carcinoma of skin of left ear and external auricular canal: Secondary | ICD-10-CM | POA: Diagnosis not present

## 2024-01-28 DIAGNOSIS — J449 Chronic obstructive pulmonary disease, unspecified: Secondary | ICD-10-CM | POA: Diagnosis not present

## 2024-01-31 DIAGNOSIS — R1312 Dysphagia, oropharyngeal phase: Secondary | ICD-10-CM | POA: Diagnosis not present

## 2024-01-31 DIAGNOSIS — E1165 Type 2 diabetes mellitus with hyperglycemia: Secondary | ICD-10-CM | POA: Diagnosis not present

## 2024-01-31 DIAGNOSIS — Z8673 Personal history of transient ischemic attack (TIA), and cerebral infarction without residual deficits: Secondary | ICD-10-CM | POA: Diagnosis not present

## 2024-02-06 DIAGNOSIS — F331 Major depressive disorder, recurrent, moderate: Secondary | ICD-10-CM | POA: Diagnosis not present

## 2024-02-06 DIAGNOSIS — E1165 Type 2 diabetes mellitus with hyperglycemia: Secondary | ICD-10-CM | POA: Diagnosis not present

## 2024-02-06 DIAGNOSIS — Z8673 Personal history of transient ischemic attack (TIA), and cerebral infarction without residual deficits: Secondary | ICD-10-CM | POA: Diagnosis not present

## 2024-02-06 DIAGNOSIS — F5101 Primary insomnia: Secondary | ICD-10-CM | POA: Diagnosis not present

## 2024-02-06 DIAGNOSIS — F0150 Vascular dementia without behavioral disturbance: Secondary | ICD-10-CM | POA: Diagnosis not present

## 2024-02-06 DIAGNOSIS — R1312 Dysphagia, oropharyngeal phase: Secondary | ICD-10-CM | POA: Diagnosis not present

## 2024-02-06 DIAGNOSIS — F411 Generalized anxiety disorder: Secondary | ICD-10-CM | POA: Diagnosis not present

## 2024-02-07 DIAGNOSIS — E1165 Type 2 diabetes mellitus with hyperglycemia: Secondary | ICD-10-CM | POA: Diagnosis not present

## 2024-02-07 DIAGNOSIS — R1312 Dysphagia, oropharyngeal phase: Secondary | ICD-10-CM | POA: Diagnosis not present

## 2024-02-07 DIAGNOSIS — Z8673 Personal history of transient ischemic attack (TIA), and cerebral infarction without residual deficits: Secondary | ICD-10-CM | POA: Diagnosis not present

## 2024-02-22 DIAGNOSIS — M549 Dorsalgia, unspecified: Secondary | ICD-10-CM | POA: Diagnosis not present

## 2024-02-22 DIAGNOSIS — Z85828 Personal history of other malignant neoplasm of skin: Secondary | ICD-10-CM | POA: Diagnosis not present

## 2024-02-22 DIAGNOSIS — S22000A Wedge compression fracture of unspecified thoracic vertebra, initial encounter for closed fracture: Secondary | ICD-10-CM | POA: Diagnosis not present

## 2024-02-22 DIAGNOSIS — C44319 Basal cell carcinoma of skin of other parts of face: Secondary | ICD-10-CM | POA: Diagnosis not present

## 2024-02-22 DIAGNOSIS — Z08 Encounter for follow-up examination after completed treatment for malignant neoplasm: Secondary | ICD-10-CM | POA: Diagnosis not present

## 2024-02-24 DIAGNOSIS — M6281 Muscle weakness (generalized): Secondary | ICD-10-CM | POA: Diagnosis not present

## 2024-02-24 DIAGNOSIS — N390 Urinary tract infection, site not specified: Secondary | ICD-10-CM | POA: Diagnosis not present

## 2024-02-24 DIAGNOSIS — R2681 Unsteadiness on feet: Secondary | ICD-10-CM | POA: Diagnosis not present

## 2024-02-24 DIAGNOSIS — R1312 Dysphagia, oropharyngeal phase: Secondary | ICD-10-CM | POA: Diagnosis not present

## 2024-02-25 DIAGNOSIS — N390 Urinary tract infection, site not specified: Secondary | ICD-10-CM | POA: Diagnosis not present

## 2024-02-25 DIAGNOSIS — R2681 Unsteadiness on feet: Secondary | ICD-10-CM | POA: Diagnosis not present

## 2024-02-25 DIAGNOSIS — R1312 Dysphagia, oropharyngeal phase: Secondary | ICD-10-CM | POA: Diagnosis not present

## 2024-02-25 DIAGNOSIS — M6281 Muscle weakness (generalized): Secondary | ICD-10-CM | POA: Diagnosis not present

## 2024-02-26 DIAGNOSIS — M545 Low back pain, unspecified: Secondary | ICD-10-CM | POA: Diagnosis not present

## 2024-02-26 DIAGNOSIS — J310 Chronic rhinitis: Secondary | ICD-10-CM | POA: Diagnosis not present

## 2024-02-26 DIAGNOSIS — R600 Localized edema: Secondary | ICD-10-CM | POA: Diagnosis not present

## 2024-02-26 DIAGNOSIS — C44219 Basal cell carcinoma of skin of left ear and external auricular canal: Secondary | ICD-10-CM | POA: Diagnosis not present

## 2024-02-27 DIAGNOSIS — R1312 Dysphagia, oropharyngeal phase: Secondary | ICD-10-CM | POA: Diagnosis not present

## 2024-02-27 DIAGNOSIS — R2681 Unsteadiness on feet: Secondary | ICD-10-CM | POA: Diagnosis not present

## 2024-02-27 DIAGNOSIS — M6281 Muscle weakness (generalized): Secondary | ICD-10-CM | POA: Diagnosis not present

## 2024-02-27 DIAGNOSIS — N390 Urinary tract infection, site not specified: Secondary | ICD-10-CM | POA: Diagnosis not present

## 2024-02-28 DIAGNOSIS — M6281 Muscle weakness (generalized): Secondary | ICD-10-CM | POA: Diagnosis not present

## 2024-02-28 DIAGNOSIS — R1312 Dysphagia, oropharyngeal phase: Secondary | ICD-10-CM | POA: Diagnosis not present

## 2024-02-28 DIAGNOSIS — N390 Urinary tract infection, site not specified: Secondary | ICD-10-CM | POA: Diagnosis not present

## 2024-02-28 DIAGNOSIS — R2681 Unsteadiness on feet: Secondary | ICD-10-CM | POA: Diagnosis not present

## 2024-02-29 DIAGNOSIS — M6281 Muscle weakness (generalized): Secondary | ICD-10-CM | POA: Diagnosis not present

## 2024-02-29 DIAGNOSIS — R1312 Dysphagia, oropharyngeal phase: Secondary | ICD-10-CM | POA: Diagnosis not present

## 2024-02-29 DIAGNOSIS — R2681 Unsteadiness on feet: Secondary | ICD-10-CM | POA: Diagnosis not present

## 2024-02-29 DIAGNOSIS — N390 Urinary tract infection, site not specified: Secondary | ICD-10-CM | POA: Diagnosis not present

## 2024-03-01 DIAGNOSIS — R1312 Dysphagia, oropharyngeal phase: Secondary | ICD-10-CM | POA: Diagnosis not present

## 2024-03-01 DIAGNOSIS — N390 Urinary tract infection, site not specified: Secondary | ICD-10-CM | POA: Diagnosis not present

## 2024-03-01 DIAGNOSIS — R2681 Unsteadiness on feet: Secondary | ICD-10-CM | POA: Diagnosis not present

## 2024-03-01 DIAGNOSIS — M6281 Muscle weakness (generalized): Secondary | ICD-10-CM | POA: Diagnosis not present

## 2024-03-02 DIAGNOSIS — N390 Urinary tract infection, site not specified: Secondary | ICD-10-CM | POA: Diagnosis not present

## 2024-03-02 DIAGNOSIS — M6281 Muscle weakness (generalized): Secondary | ICD-10-CM | POA: Diagnosis not present

## 2024-03-02 DIAGNOSIS — R2681 Unsteadiness on feet: Secondary | ICD-10-CM | POA: Diagnosis not present

## 2024-03-02 DIAGNOSIS — R1312 Dysphagia, oropharyngeal phase: Secondary | ICD-10-CM | POA: Diagnosis not present

## 2024-03-05 DIAGNOSIS — R1312 Dysphagia, oropharyngeal phase: Secondary | ICD-10-CM | POA: Diagnosis not present

## 2024-03-05 DIAGNOSIS — N390 Urinary tract infection, site not specified: Secondary | ICD-10-CM | POA: Diagnosis not present

## 2024-03-05 DIAGNOSIS — R2681 Unsteadiness on feet: Secondary | ICD-10-CM | POA: Diagnosis not present

## 2024-03-05 DIAGNOSIS — M6281 Muscle weakness (generalized): Secondary | ICD-10-CM | POA: Diagnosis not present

## 2024-03-06 DIAGNOSIS — M6281 Muscle weakness (generalized): Secondary | ICD-10-CM | POA: Diagnosis not present

## 2024-03-06 DIAGNOSIS — R2681 Unsteadiness on feet: Secondary | ICD-10-CM | POA: Diagnosis not present

## 2024-03-06 DIAGNOSIS — N390 Urinary tract infection, site not specified: Secondary | ICD-10-CM | POA: Diagnosis not present

## 2024-03-06 DIAGNOSIS — R1312 Dysphagia, oropharyngeal phase: Secondary | ICD-10-CM | POA: Diagnosis not present

## 2024-03-08 DIAGNOSIS — N390 Urinary tract infection, site not specified: Secondary | ICD-10-CM | POA: Diagnosis not present

## 2024-03-08 DIAGNOSIS — R2681 Unsteadiness on feet: Secondary | ICD-10-CM | POA: Diagnosis not present

## 2024-03-08 DIAGNOSIS — M6281 Muscle weakness (generalized): Secondary | ICD-10-CM | POA: Diagnosis not present

## 2024-03-08 DIAGNOSIS — R1312 Dysphagia, oropharyngeal phase: Secondary | ICD-10-CM | POA: Diagnosis not present

## 2024-03-11 DIAGNOSIS — R1312 Dysphagia, oropharyngeal phase: Secondary | ICD-10-CM | POA: Diagnosis not present

## 2024-03-11 DIAGNOSIS — N390 Urinary tract infection, site not specified: Secondary | ICD-10-CM | POA: Diagnosis not present

## 2024-03-11 DIAGNOSIS — R2681 Unsteadiness on feet: Secondary | ICD-10-CM | POA: Diagnosis not present

## 2024-03-11 DIAGNOSIS — M6281 Muscle weakness (generalized): Secondary | ICD-10-CM | POA: Diagnosis not present

## 2024-03-12 DIAGNOSIS — N390 Urinary tract infection, site not specified: Secondary | ICD-10-CM | POA: Diagnosis not present

## 2024-03-12 DIAGNOSIS — R1312 Dysphagia, oropharyngeal phase: Secondary | ICD-10-CM | POA: Diagnosis not present

## 2024-03-12 DIAGNOSIS — M6281 Muscle weakness (generalized): Secondary | ICD-10-CM | POA: Diagnosis not present

## 2024-03-12 DIAGNOSIS — R2681 Unsteadiness on feet: Secondary | ICD-10-CM | POA: Diagnosis not present

## 2024-03-13 DIAGNOSIS — R2681 Unsteadiness on feet: Secondary | ICD-10-CM | POA: Diagnosis not present

## 2024-03-13 DIAGNOSIS — N390 Urinary tract infection, site not specified: Secondary | ICD-10-CM | POA: Diagnosis not present

## 2024-03-13 DIAGNOSIS — R1312 Dysphagia, oropharyngeal phase: Secondary | ICD-10-CM | POA: Diagnosis not present

## 2024-03-13 DIAGNOSIS — M6281 Muscle weakness (generalized): Secondary | ICD-10-CM | POA: Diagnosis not present

## 2024-03-15 DIAGNOSIS — R1312 Dysphagia, oropharyngeal phase: Secondary | ICD-10-CM | POA: Diagnosis not present

## 2024-03-15 DIAGNOSIS — R2681 Unsteadiness on feet: Secondary | ICD-10-CM | POA: Diagnosis not present

## 2024-03-15 DIAGNOSIS — M6281 Muscle weakness (generalized): Secondary | ICD-10-CM | POA: Diagnosis not present

## 2024-03-15 DIAGNOSIS — N390 Urinary tract infection, site not specified: Secondary | ICD-10-CM | POA: Diagnosis not present

## 2024-03-16 DIAGNOSIS — M6281 Muscle weakness (generalized): Secondary | ICD-10-CM | POA: Diagnosis not present

## 2024-03-16 DIAGNOSIS — N390 Urinary tract infection, site not specified: Secondary | ICD-10-CM | POA: Diagnosis not present

## 2024-03-16 DIAGNOSIS — R2681 Unsteadiness on feet: Secondary | ICD-10-CM | POA: Diagnosis not present

## 2024-03-16 DIAGNOSIS — R1312 Dysphagia, oropharyngeal phase: Secondary | ICD-10-CM | POA: Diagnosis not present

## 2024-03-19 DIAGNOSIS — N390 Urinary tract infection, site not specified: Secondary | ICD-10-CM | POA: Diagnosis not present

## 2024-03-19 DIAGNOSIS — R1312 Dysphagia, oropharyngeal phase: Secondary | ICD-10-CM | POA: Diagnosis not present

## 2024-03-19 DIAGNOSIS — R2681 Unsteadiness on feet: Secondary | ICD-10-CM | POA: Diagnosis not present

## 2024-03-19 DIAGNOSIS — M6281 Muscle weakness (generalized): Secondary | ICD-10-CM | POA: Diagnosis not present

## 2024-03-22 DIAGNOSIS — R1312 Dysphagia, oropharyngeal phase: Secondary | ICD-10-CM | POA: Diagnosis not present

## 2024-03-22 DIAGNOSIS — R2681 Unsteadiness on feet: Secondary | ICD-10-CM | POA: Diagnosis not present

## 2024-03-22 DIAGNOSIS — N390 Urinary tract infection, site not specified: Secondary | ICD-10-CM | POA: Diagnosis not present

## 2024-03-22 DIAGNOSIS — M6281 Muscle weakness (generalized): Secondary | ICD-10-CM | POA: Diagnosis not present

## 2024-03-26 ENCOUNTER — Ambulatory Visit: Admitting: Family Medicine

## 2024-03-30 ENCOUNTER — Encounter: Payer: Self-pay | Admitting: Family Medicine

## 2024-03-30 ENCOUNTER — Ambulatory Visit (INDEPENDENT_AMBULATORY_CARE_PROVIDER_SITE_OTHER): Admitting: Family Medicine

## 2024-03-30 VITALS — BP 127/55 | HR 55 | Ht 70.0 in | Wt 188.0 lb

## 2024-03-30 DIAGNOSIS — M25512 Pain in left shoulder: Secondary | ICD-10-CM | POA: Diagnosis not present

## 2024-03-30 DIAGNOSIS — G8929 Other chronic pain: Secondary | ICD-10-CM

## 2024-03-30 DIAGNOSIS — M25511 Pain in right shoulder: Secondary | ICD-10-CM

## 2024-03-30 NOTE — Progress Notes (Signed)
 Subjective:    Patient ID: Barry [name], male    DOB: 29-Apr-1933, 89 y.o.   MRN: [id number]  Patient has chronic bilateral shoulder pain with impingement.  He significant pain on a daily basis in both shoulders.  He is unable to abduct his shoulder greater than 90 degrees.  His shoulders ache and throb.  It is difficult for him to take off his shirt due to pain in his shoulders.  He has been getting cortisone injections every 3 months.  He would like to get these again today. Past Medical History:  Diagnosis Date   AAA (abdominal aortic aneurysm) (HCC) 12/17/2007   4.7 cm   Allergy     rhinitis   Anxiety    Cancer (HCC)    skin   Cataract    Depression    Diabetes mellitus    Diverticulosis    GERD (gastroesophageal reflux disease)    Hepatitis    Hx of colonic polyps 08/26/2015   Hyperlipidemia    Hypertension    Nephrolithiasis    Personal history of colonic polyps    adenomas, serrated also   PVD (peripheral vascular disease) (HCC)    Stroke (HCC)    Tobacco abuse    Past Surgical History:  Procedure Laterality Date   COLONOSCOPY  2006, 2008, 2010, 05/27/2011   numerous adenomas - 13 in 2006, 3, 2008, 2 2012 (up to 1 cm), 4 diminutive adenomas, serrated adenomas 2012. Diverticulosis and hemorrhoids also.   EYE SURGERY     EYE SURGERY Left    Kidney stone operation     lower limb amputation, other toe 5th     percutaneous stent graft repair of infrarenal AAA  11/10   5.6 cm (T. early)   surgical excision basal cell carcinoma     TEE WITHOUT CARDIOVERSION N/A 07/29/2021   Procedure: TRANSESOPHAGEAL ECHOCARDIOGRAM (TEE);  Surgeon: Harrold Lincoln, MD;  Location: [facility];  Service: Cardiovascular;  Laterality: N/A;   tympanic eardrum repair     VASECTOMY     Current Outpatient Medications on File Prior to Visit  Medication Sig Dispense Refill   acetaminophen  (TYLENOL ) 325 MG tablet Take 2 tablets (650 mg total) by mouth every 6 (six) hours as needed for mild  pain (or Fever >/= 101).     amLODipine  (NORVASC ) 10 MG tablet Take 1 tablet (10 mg total) by mouth daily. 90 tablet 3   artificial tears (LACRILUBE) OINT ophthalmic ointment Place into both eyes every 4 (four) hours as needed for dry eyes.     buPROPion  (WELLBUTRIN  XL) 150 MG 24 hr tablet Take 1 tablet (150 mg total) by mouth daily. 90 tablet 3   clonazePAM  (KLONOPIN ) 0.5 MG tablet TAKE 1/2 TABLET BY MOUTH 3 TIMES DAILY AS NEEDED FOR ANXIETY. 5 tablet 0   clopidogrel  (PLAVIX ) 75 MG tablet Take 1 tablet (75 mg total) by mouth daily. tAKE 1 TABLET (75 MG TOTAL) BY MOUTH DAILY. nEED APPOINTMENT WITH PRIMARY CARE PROVIDER FOR FURTHER REFILLS (Patient taking differently: Take 75 mg by mouth daily.) 90 tablet 3   cyanocobalamin  (VITAMIN B12) 1000 MCG tablet Take 1 tablet (1,000 mcg total) by mouth daily. 90 tablet 3   ezetimibe  (ZETIA ) 10 MG tablet Take 1 tablet (10 mg total) by mouth daily. 90 tablet 3   FLUoxetine  (PROZAC ) 20 MG capsule TAKE (1) CAPSULE BY MOUTH EACH MORNING. (Patient taking differently: Take 20 mg by mouth daily. TAKE (1) CAPSULE BY MOUTH EACH MORNING.) 90 capsule 1  gabapentin  (NEURONTIN ) 300 MG capsule Take 1 capsule (300 mg total) by mouth 2 (two) times daily. (Patient taking differently: Take 300 mg by mouth 2 (two) times daily as needed (nerve pain).) 180 capsule 3   glipiZIDE  (GLIPIZIDE  XL) 10 MG 24 hr tablet Take 1 tablet (10 mg total) by mouth daily with breakfast. 90 tablet 3   hydrALAZINE  (APRESOLINE ) 100 MG tablet Take 1 tablet (100 mg total) by mouth every 8 (eight) hours. 90 tablet 9   metoprolol  succinate (TOPROL -XL) 25 MG 24 hr tablet Take 0.5 tablets (12.5 mg total) by mouth daily. 45 tablet 3   montelukast  (SINGULAIR ) 10 MG tablet TAKE 1 TABLET (10 MG TOTAL) BY MOUTH AT BEDTIME. 90 tablet 1   pioglitazone  (ACTOS ) 30 MG tablet Take 1 tablet (30 mg total) by mouth daily. 90 tablet 3   rosuvastatin  (CRESTOR ) 20 MG tablet Take 1 tablet (20 mg total) by mouth daily. 90  tablet 3   valsartan  (DIOVAN ) 160 MG tablet Take 1 tablet (160 mg total) by mouth 2 (two) times daily. 180 tablet 3   No current facility-administered medications on file prior to visit.   Allergies  Allergen Reactions   Lisinopril-Hydrochlorothiazide  Swelling   Doxycycline Rash   Social History   Socioeconomic History   Marital status: Widowed    Spouse name: Not on file   Number of children: 2   Years of education: Not on file   Highest education level: Not on file  Occupational History    Employer: RETIRED  Tobacco Use   Smoking status: Former    Current packs/day: 1.00    Average packs/day: 1 pack/day for 60.0 years (60.0 ttl pk-yrs)    Types: Cigarettes   Smokeless tobacco: Never   Tobacco comments:    Hasn't smoked since he had his stroke   Vaping Use   Vaping status: Never Used  Substance and Sexual Activity   Alcohol use: No   Drug use: No   Sexual activity: Yes    Partners: Female    Birth control/protection: Surgical    Comment: vasectomy  Other Topics Concern   Not on file  Social History Narrative   11/11/21 living with son, Michale Age   HSG, 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 Lives alone. 1- step-daughter, 1 son - '68; 3 grandchildren. 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)   Overall Financial Resource Strain (CARDIA)    Difficulty of Paying Living Expenses: Not hard at all  Food Insecurity: No Food Insecurity (09/11/2023)   Hunger Vital Sign    Worried About Running Out of Food in the Last Year: Never true    Ran Out of Food in the Last Year: Never true  Transportation Needs: No Transportation Needs (09/11/2023)   PRAPARE - Administrator, Civil Service (Medical): No    Lack of Transportation (Non-Medical): No  Physical Activity: Inactive (01/06/2023)   Exercise Vital Sign    Days of Exercise per Week: 0 days    Minutes of Exercise per  Session: 0 min  Stress: No Stress Concern Present (01/06/2023)   [employer] of Occupational Health - Occupational Stress Questionnaire    Feeling of Stress : Only a little  Social Connections: Moderately Isolated (01/06/2023)   Social Connection and Isolation Panel    Frequency of Communication with Friends and Family: More than three times a week    Frequency  of Social Gatherings with Friends and Family: More than three times a week    Attends Religious Services: 1 to 4 times per year    Active Member of [employer] or Organizations: No    Attends Banker Meetings: Never    Marital Status: Widowed  Intimate Partner Violence: Not At Risk (09/11/2023)   Humiliation, Afraid, Rape, and Kick questionnaire    Fear of Current or Ex-Partner: No    Emotionally Abused: No    Physically Abused: No    Sexually Abused: No     Review of Systems  Neurological:  Positive for dizziness.  All other systems reviewed and are negative.      Objective:   Physical Exam Vitals reviewed.  Constitutional:      General: He is not in acute distress.    Appearance: He is not toxic-appearing.   Cardiovascular:     Rate and Rhythm: Normal rate.     Heart sounds: Murmur heard.  Pulmonary:     Effort: Pulmonary effort is normal.     Breath sounds: Normal breath sounds. No wheezing.   Musculoskeletal:     Right shoulder: Tenderness and crepitus present. No bony tenderness. Decreased range of motion.     Left shoulder: Tenderness and crepitus present. No bony tenderness. Decreased range of motion.     Right lower leg: No edema.     Left lower leg: No edema.   Neurological:     General: No focal deficit present.     Mental Status: He is alert and oriented to person, place, and time.     Cranial Nerves: No cranial nerve deficit.     Sensory: No sensory deficit.     Coordination: Coordination normal.     Gait: Gait abnormal.     Deep Tendon Reflexes: Reflexes normal.            Assessment & Plan:  Chronic pain of both shoulders - Plan: Ambulatory referral to Orthopedic Surgery  Using sterile technique, I injected his left shoulder with 2 cc lidocaine , 2 cc of Marcaine , and 2 cc of 40 mg/mL Kenalog .  The patient tolerated the procedure well.  I then injected his right shoulder with 2 cc of lidocaine , 2 cc of Marcaine , 2 cc of 40 mg/mL Kenalog .  Patient tolerated this procedure well.  I believe his shoulder pain is due to chronic tendinosis of the the supraspinatus bilaterally coupled with chronic impingement syndrome and likely arthritis.  He receives subjective pain relief from the injections.  We can injections every 3 months as needed

## 2024-04-03 ENCOUNTER — Ambulatory Visit: Admitting: Family Medicine

## 2024-04-05 MED ORDER — BUPIVACAINE HCL 0.25 % IJ SOLN: 5.0000 mL | Freq: Once | INTRAMUSCULAR | Status: AC

## 2024-04-05 MED ORDER — BUPIVACAINE HCL 0.25 % IJ SOLN
5.0000 mL | Freq: Once | INTRAMUSCULAR | Status: AC
  Administered 2024-04-05: 5 mL

## 2024-04-05 MED ORDER — TRIAMCINOLONE ACETONIDE 40 MG/ML IJ SUSP: 40.0000 mg | Freq: Once | INTRAMUSCULAR | Status: AC

## 2024-04-05 NOTE — Addendum Note (Signed)
 Addended by: Meriam Stamp on: 04/05/2024 09:47 AM   Modules accepted: Orders

## 2024-04-09 DIAGNOSIS — F331 Major depressive disorder, recurrent, moderate: Secondary | ICD-10-CM | POA: Diagnosis not present

## 2024-04-09 DIAGNOSIS — F411 Generalized anxiety disorder: Secondary | ICD-10-CM | POA: Diagnosis not present

## 2024-04-09 DIAGNOSIS — F5101 Primary insomnia: Secondary | ICD-10-CM | POA: Diagnosis not present

## 2024-04-09 DIAGNOSIS — F0150 Vascular dementia without behavioral disturbance: Secondary | ICD-10-CM | POA: Diagnosis not present

## 2024-04-10 DIAGNOSIS — R278 Other lack of coordination: Secondary | ICD-10-CM | POA: Diagnosis not present

## 2024-04-10 DIAGNOSIS — G8194 Hemiplegia, unspecified affecting left nondominant side: Secondary | ICD-10-CM | POA: Diagnosis not present

## 2024-04-10 DIAGNOSIS — Z9181 History of falling: Secondary | ICD-10-CM | POA: Diagnosis not present

## 2024-04-10 DIAGNOSIS — Z8673 Personal history of transient ischemic attack (TIA), and cerebral infarction without residual deficits: Secondary | ICD-10-CM | POA: Diagnosis not present

## 2024-04-11 DIAGNOSIS — L602 Onychogryphosis: Secondary | ICD-10-CM | POA: Diagnosis not present

## 2024-04-11 DIAGNOSIS — R278 Other lack of coordination: Secondary | ICD-10-CM | POA: Diagnosis not present

## 2024-04-11 DIAGNOSIS — E1151 Type 2 diabetes mellitus with diabetic peripheral angiopathy without gangrene: Secondary | ICD-10-CM | POA: Diagnosis not present

## 2024-04-11 DIAGNOSIS — L603 Nail dystrophy: Secondary | ICD-10-CM | POA: Diagnosis not present

## 2024-04-11 DIAGNOSIS — G8194 Hemiplegia, unspecified affecting left nondominant side: Secondary | ICD-10-CM | POA: Diagnosis not present

## 2024-04-11 DIAGNOSIS — Z7984 Long term (current) use of oral hypoglycemic drugs: Secondary | ICD-10-CM | POA: Diagnosis not present

## 2024-04-11 DIAGNOSIS — Z8673 Personal history of transient ischemic attack (TIA), and cerebral infarction without residual deficits: Secondary | ICD-10-CM | POA: Diagnosis not present

## 2024-04-11 DIAGNOSIS — Z9181 History of falling: Secondary | ICD-10-CM | POA: Diagnosis not present

## 2024-04-12 DIAGNOSIS — G8194 Hemiplegia, unspecified affecting left nondominant side: Secondary | ICD-10-CM | POA: Diagnosis not present

## 2024-04-12 DIAGNOSIS — Z9181 History of falling: Secondary | ICD-10-CM | POA: Diagnosis not present

## 2024-04-12 DIAGNOSIS — Z8673 Personal history of transient ischemic attack (TIA), and cerebral infarction without residual deficits: Secondary | ICD-10-CM | POA: Diagnosis not present

## 2024-04-12 DIAGNOSIS — R278 Other lack of coordination: Secondary | ICD-10-CM | POA: Diagnosis not present

## 2024-04-13 DIAGNOSIS — Z9181 History of falling: Secondary | ICD-10-CM | POA: Diagnosis not present

## 2024-04-13 DIAGNOSIS — Z8673 Personal history of transient ischemic attack (TIA), and cerebral infarction without residual deficits: Secondary | ICD-10-CM | POA: Diagnosis not present

## 2024-04-13 DIAGNOSIS — G8194 Hemiplegia, unspecified affecting left nondominant side: Secondary | ICD-10-CM | POA: Diagnosis not present

## 2024-04-13 DIAGNOSIS — R278 Other lack of coordination: Secondary | ICD-10-CM | POA: Diagnosis not present

## 2024-04-16 DIAGNOSIS — R278 Other lack of coordination: Secondary | ICD-10-CM | POA: Diagnosis not present

## 2024-04-16 DIAGNOSIS — Z9181 History of falling: Secondary | ICD-10-CM | POA: Diagnosis not present

## 2024-04-16 DIAGNOSIS — Z8673 Personal history of transient ischemic attack (TIA), and cerebral infarction without residual deficits: Secondary | ICD-10-CM | POA: Diagnosis not present

## 2024-04-16 DIAGNOSIS — G8194 Hemiplegia, unspecified affecting left nondominant side: Secondary | ICD-10-CM | POA: Diagnosis not present

## 2024-04-19 DIAGNOSIS — G8194 Hemiplegia, unspecified affecting left nondominant side: Secondary | ICD-10-CM | POA: Diagnosis not present

## 2024-04-19 DIAGNOSIS — Z8673 Personal history of transient ischemic attack (TIA), and cerebral infarction without residual deficits: Secondary | ICD-10-CM | POA: Diagnosis not present

## 2024-04-19 DIAGNOSIS — Z9181 History of falling: Secondary | ICD-10-CM | POA: Diagnosis not present

## 2024-04-19 DIAGNOSIS — R278 Other lack of coordination: Secondary | ICD-10-CM | POA: Diagnosis not present

## 2024-04-20 DIAGNOSIS — Z9181 History of falling: Secondary | ICD-10-CM | POA: Diagnosis not present

## 2024-04-20 DIAGNOSIS — G8194 Hemiplegia, unspecified affecting left nondominant side: Secondary | ICD-10-CM | POA: Diagnosis not present

## 2024-04-20 DIAGNOSIS — R278 Other lack of coordination: Secondary | ICD-10-CM | POA: Diagnosis not present

## 2024-04-20 DIAGNOSIS — Z8673 Personal history of transient ischemic attack (TIA), and cerebral infarction without residual deficits: Secondary | ICD-10-CM | POA: Diagnosis not present

## 2024-04-24 DIAGNOSIS — Z8673 Personal history of transient ischemic attack (TIA), and cerebral infarction without residual deficits: Secondary | ICD-10-CM | POA: Diagnosis not present

## 2024-04-24 DIAGNOSIS — Z9181 History of falling: Secondary | ICD-10-CM | POA: Diagnosis not present

## 2024-04-24 DIAGNOSIS — R278 Other lack of coordination: Secondary | ICD-10-CM | POA: Diagnosis not present

## 2024-04-24 DIAGNOSIS — G8194 Hemiplegia, unspecified affecting left nondominant side: Secondary | ICD-10-CM | POA: Diagnosis not present

## 2024-04-25 DIAGNOSIS — M79672 Pain in left foot: Secondary | ICD-10-CM | POA: Diagnosis not present

## 2024-04-25 DIAGNOSIS — M25572 Pain in left ankle and joints of left foot: Secondary | ICD-10-CM | POA: Diagnosis not present

## 2024-04-25 DIAGNOSIS — M79662 Pain in left lower leg: Secondary | ICD-10-CM | POA: Diagnosis not present

## 2024-04-26 DIAGNOSIS — G8194 Hemiplegia, unspecified affecting left nondominant side: Secondary | ICD-10-CM | POA: Diagnosis not present

## 2024-04-26 DIAGNOSIS — R278 Other lack of coordination: Secondary | ICD-10-CM | POA: Diagnosis not present

## 2024-04-26 DIAGNOSIS — Z9181 History of falling: Secondary | ICD-10-CM | POA: Diagnosis not present

## 2024-04-26 DIAGNOSIS — Z8673 Personal history of transient ischemic attack (TIA), and cerebral infarction without residual deficits: Secondary | ICD-10-CM | POA: Diagnosis not present

## 2024-05-01 DIAGNOSIS — G8194 Hemiplegia, unspecified affecting left nondominant side: Secondary | ICD-10-CM | POA: Diagnosis not present

## 2024-05-01 DIAGNOSIS — R278 Other lack of coordination: Secondary | ICD-10-CM | POA: Diagnosis not present

## 2024-05-01 DIAGNOSIS — Z9181 History of falling: Secondary | ICD-10-CM | POA: Diagnosis not present

## 2024-05-01 DIAGNOSIS — Z8673 Personal history of transient ischemic attack (TIA), and cerebral infarction without residual deficits: Secondary | ICD-10-CM | POA: Diagnosis not present

## 2024-05-03 DIAGNOSIS — Z8673 Personal history of transient ischemic attack (TIA), and cerebral infarction without residual deficits: Secondary | ICD-10-CM | POA: Diagnosis not present

## 2024-05-03 DIAGNOSIS — G8194 Hemiplegia, unspecified affecting left nondominant side: Secondary | ICD-10-CM | POA: Diagnosis not present

## 2024-05-03 DIAGNOSIS — R278 Other lack of coordination: Secondary | ICD-10-CM | POA: Diagnosis not present

## 2024-05-03 DIAGNOSIS — Z9181 History of falling: Secondary | ICD-10-CM | POA: Diagnosis not present

## 2024-05-04 ENCOUNTER — Ambulatory Visit: Admitting: Surgical

## 2024-05-04 ENCOUNTER — Encounter: Payer: Self-pay | Admitting: Advanced Practice Midwife

## 2024-05-07 DIAGNOSIS — R278 Other lack of coordination: Secondary | ICD-10-CM | POA: Diagnosis not present

## 2024-05-07 DIAGNOSIS — G8194 Hemiplegia, unspecified affecting left nondominant side: Secondary | ICD-10-CM | POA: Diagnosis not present

## 2024-05-07 DIAGNOSIS — Z8673 Personal history of transient ischemic attack (TIA), and cerebral infarction without residual deficits: Secondary | ICD-10-CM | POA: Diagnosis not present

## 2024-05-07 DIAGNOSIS — Z9181 History of falling: Secondary | ICD-10-CM | POA: Diagnosis not present

## 2024-05-08 DIAGNOSIS — G8194 Hemiplegia, unspecified affecting left nondominant side: Secondary | ICD-10-CM | POA: Diagnosis not present

## 2024-05-08 DIAGNOSIS — R278 Other lack of coordination: Secondary | ICD-10-CM | POA: Diagnosis not present

## 2024-05-08 DIAGNOSIS — Z8673 Personal history of transient ischemic attack (TIA), and cerebral infarction without residual deficits: Secondary | ICD-10-CM | POA: Diagnosis not present

## 2024-05-08 DIAGNOSIS — Z9181 History of falling: Secondary | ICD-10-CM | POA: Diagnosis not present

## 2024-06-03 DIAGNOSIS — E1151 Type 2 diabetes mellitus with diabetic peripheral angiopathy without gangrene: Secondary | ICD-10-CM | POA: Diagnosis not present

## 2024-06-03 DIAGNOSIS — J449 Chronic obstructive pulmonary disease, unspecified: Secondary | ICD-10-CM | POA: Diagnosis not present

## 2024-06-03 DIAGNOSIS — R2681 Unsteadiness on feet: Secondary | ICD-10-CM | POA: Diagnosis not present

## 2024-06-03 DIAGNOSIS — M25552 Pain in left hip: Secondary | ICD-10-CM | POA: Diagnosis not present

## 2024-06-03 DIAGNOSIS — G8192 Hemiplegia, unspecified affecting left dominant side: Secondary | ICD-10-CM | POA: Diagnosis not present

## 2024-06-04 DIAGNOSIS — F411 Generalized anxiety disorder: Secondary | ICD-10-CM | POA: Diagnosis not present

## 2024-06-04 DIAGNOSIS — M25551 Pain in right hip: Secondary | ICD-10-CM | POA: Diagnosis not present

## 2024-06-04 DIAGNOSIS — F5101 Primary insomnia: Secondary | ICD-10-CM | POA: Diagnosis not present

## 2024-06-04 DIAGNOSIS — F0150 Vascular dementia without behavioral disturbance: Secondary | ICD-10-CM | POA: Diagnosis not present

## 2024-06-04 DIAGNOSIS — F331 Major depressive disorder, recurrent, moderate: Secondary | ICD-10-CM | POA: Diagnosis not present

## 2024-06-04 DIAGNOSIS — R079 Chest pain, unspecified: Secondary | ICD-10-CM | POA: Diagnosis not present

## 2024-06-05 DIAGNOSIS — U071 COVID-19: Secondary | ICD-10-CM | POA: Diagnosis not present

## 2024-06-05 DIAGNOSIS — R059 Cough, unspecified: Secondary | ICD-10-CM | POA: Diagnosis not present

## 2024-06-06 DIAGNOSIS — D649 Anemia, unspecified: Secondary | ICD-10-CM | POA: Diagnosis not present

## 2024-06-06 DIAGNOSIS — I10 Essential (primary) hypertension: Secondary | ICD-10-CM | POA: Diagnosis not present

## 2024-06-10 DIAGNOSIS — R0602 Shortness of breath: Secondary | ICD-10-CM | POA: Diagnosis not present

## 2024-06-11 DIAGNOSIS — J449 Chronic obstructive pulmonary disease, unspecified: Secondary | ICD-10-CM | POA: Diagnosis not present

## 2024-06-11 DIAGNOSIS — I509 Heart failure, unspecified: Secondary | ICD-10-CM | POA: Diagnosis not present

## 2024-06-19 DIAGNOSIS — N390 Urinary tract infection, site not specified: Secondary | ICD-10-CM | POA: Diagnosis not present

## 2024-06-19 DIAGNOSIS — R2681 Unsteadiness on feet: Secondary | ICD-10-CM | POA: Diagnosis not present

## 2024-06-19 DIAGNOSIS — M6281 Muscle weakness (generalized): Secondary | ICD-10-CM | POA: Diagnosis not present

## 2024-06-20 DIAGNOSIS — R2681 Unsteadiness on feet: Secondary | ICD-10-CM | POA: Diagnosis not present

## 2024-06-20 DIAGNOSIS — N390 Urinary tract infection, site not specified: Secondary | ICD-10-CM | POA: Diagnosis not present

## 2024-06-20 DIAGNOSIS — M6281 Muscle weakness (generalized): Secondary | ICD-10-CM | POA: Diagnosis not present

## 2024-06-21 DIAGNOSIS — N390 Urinary tract infection, site not specified: Secondary | ICD-10-CM | POA: Diagnosis not present

## 2024-06-21 DIAGNOSIS — R2681 Unsteadiness on feet: Secondary | ICD-10-CM | POA: Diagnosis not present

## 2024-06-21 DIAGNOSIS — M6281 Muscle weakness (generalized): Secondary | ICD-10-CM | POA: Diagnosis not present

## 2024-06-22 DIAGNOSIS — R2681 Unsteadiness on feet: Secondary | ICD-10-CM | POA: Diagnosis not present

## 2024-06-22 DIAGNOSIS — M6281 Muscle weakness (generalized): Secondary | ICD-10-CM | POA: Diagnosis not present

## 2024-06-22 DIAGNOSIS — N390 Urinary tract infection, site not specified: Secondary | ICD-10-CM | POA: Diagnosis not present

## 2024-06-23 DIAGNOSIS — Z79899 Other long term (current) drug therapy: Secondary | ICD-10-CM | POA: Diagnosis not present

## 2024-06-25 DIAGNOSIS — N390 Urinary tract infection, site not specified: Secondary | ICD-10-CM | POA: Diagnosis not present

## 2024-06-25 DIAGNOSIS — R2681 Unsteadiness on feet: Secondary | ICD-10-CM | POA: Diagnosis not present

## 2024-06-25 DIAGNOSIS — M6281 Muscle weakness (generalized): Secondary | ICD-10-CM | POA: Diagnosis not present

## 2024-06-26 DIAGNOSIS — R2681 Unsteadiness on feet: Secondary | ICD-10-CM | POA: Diagnosis not present

## 2024-06-26 DIAGNOSIS — N390 Urinary tract infection, site not specified: Secondary | ICD-10-CM | POA: Diagnosis not present

## 2024-06-26 DIAGNOSIS — M6281 Muscle weakness (generalized): Secondary | ICD-10-CM | POA: Diagnosis not present

## 2024-06-28 DIAGNOSIS — M6281 Muscle weakness (generalized): Secondary | ICD-10-CM | POA: Diagnosis not present

## 2024-06-28 DIAGNOSIS — N390 Urinary tract infection, site not specified: Secondary | ICD-10-CM | POA: Diagnosis not present

## 2024-06-28 DIAGNOSIS — R2681 Unsteadiness on feet: Secondary | ICD-10-CM | POA: Diagnosis not present

## 2024-06-28 DIAGNOSIS — R4182 Altered mental status, unspecified: Secondary | ICD-10-CM | POA: Diagnosis not present

## 2024-06-29 DIAGNOSIS — Z79899 Other long term (current) drug therapy: Secondary | ICD-10-CM | POA: Diagnosis not present

## 2024-06-29 DIAGNOSIS — R8290 Unspecified abnormal findings in urine: Secondary | ICD-10-CM | POA: Diagnosis not present

## 2024-06-30 DIAGNOSIS — Z79899 Other long term (current) drug therapy: Secondary | ICD-10-CM | POA: Diagnosis not present

## 2024-07-06 DIAGNOSIS — N390 Urinary tract infection, site not specified: Secondary | ICD-10-CM | POA: Diagnosis not present

## 2024-07-06 DIAGNOSIS — R2681 Unsteadiness on feet: Secondary | ICD-10-CM | POA: Diagnosis not present

## 2024-07-06 DIAGNOSIS — M6281 Muscle weakness (generalized): Secondary | ICD-10-CM | POA: Diagnosis not present

## 2024-07-09 DIAGNOSIS — F331 Major depressive disorder, recurrent, moderate: Secondary | ICD-10-CM | POA: Diagnosis not present

## 2024-07-09 DIAGNOSIS — F5101 Primary insomnia: Secondary | ICD-10-CM | POA: Diagnosis not present

## 2024-07-09 DIAGNOSIS — R2681 Unsteadiness on feet: Secondary | ICD-10-CM | POA: Diagnosis not present

## 2024-07-09 DIAGNOSIS — F411 Generalized anxiety disorder: Secondary | ICD-10-CM | POA: Diagnosis not present

## 2024-07-09 DIAGNOSIS — M6281 Muscle weakness (generalized): Secondary | ICD-10-CM | POA: Diagnosis not present

## 2024-07-09 DIAGNOSIS — N390 Urinary tract infection, site not specified: Secondary | ICD-10-CM | POA: Diagnosis not present

## 2024-07-09 DIAGNOSIS — F0150 Vascular dementia without behavioral disturbance: Secondary | ICD-10-CM | POA: Diagnosis not present

## 2024-07-10 DIAGNOSIS — R2681 Unsteadiness on feet: Secondary | ICD-10-CM | POA: Diagnosis not present

## 2024-07-10 DIAGNOSIS — M6281 Muscle weakness (generalized): Secondary | ICD-10-CM | POA: Diagnosis not present

## 2024-07-10 DIAGNOSIS — N390 Urinary tract infection, site not specified: Secondary | ICD-10-CM | POA: Diagnosis not present

## 2024-07-16 DIAGNOSIS — E119 Type 2 diabetes mellitus without complications: Secondary | ICD-10-CM | POA: Diagnosis not present

## 2024-07-16 DIAGNOSIS — I509 Heart failure, unspecified: Secondary | ICD-10-CM | POA: Diagnosis not present

## 2024-07-16 DIAGNOSIS — N390 Urinary tract infection, site not specified: Secondary | ICD-10-CM | POA: Diagnosis not present

## 2024-07-16 DIAGNOSIS — M6281 Muscle weakness (generalized): Secondary | ICD-10-CM | POA: Diagnosis not present

## 2024-07-16 DIAGNOSIS — R2681 Unsteadiness on feet: Secondary | ICD-10-CM | POA: Diagnosis not present

## 2024-07-17 DIAGNOSIS — M6281 Muscle weakness (generalized): Secondary | ICD-10-CM | POA: Diagnosis not present

## 2024-07-17 DIAGNOSIS — N390 Urinary tract infection, site not specified: Secondary | ICD-10-CM | POA: Diagnosis not present

## 2024-07-17 DIAGNOSIS — R2681 Unsteadiness on feet: Secondary | ICD-10-CM | POA: Diagnosis not present

## 2024-07-26 DIAGNOSIS — R41841 Cognitive communication deficit: Secondary | ICD-10-CM | POA: Diagnosis not present

## 2024-07-26 DIAGNOSIS — I69319 Unspecified symptoms and signs involving cognitive functions following cerebral infarction: Secondary | ICD-10-CM | POA: Diagnosis not present

## 2024-07-26 DIAGNOSIS — N390 Urinary tract infection, site not specified: Secondary | ICD-10-CM | POA: Diagnosis not present

## 2024-07-29 DIAGNOSIS — G8194 Hemiplegia, unspecified affecting left nondominant side: Secondary | ICD-10-CM | POA: Diagnosis not present

## 2024-07-29 DIAGNOSIS — R410 Disorientation, unspecified: Secondary | ICD-10-CM | POA: Diagnosis not present

## 2024-07-29 DIAGNOSIS — M25512 Pain in left shoulder: Secondary | ICD-10-CM | POA: Diagnosis not present

## 2024-07-29 DIAGNOSIS — E1151 Type 2 diabetes mellitus with diabetic peripheral angiopathy without gangrene: Secondary | ICD-10-CM | POA: Diagnosis not present

## 2024-07-29 DIAGNOSIS — M25511 Pain in right shoulder: Secondary | ICD-10-CM | POA: Diagnosis not present

## 2024-07-31 ENCOUNTER — Ambulatory Visit: Admitting: Family Medicine

## 2024-08-01 DIAGNOSIS — Z23 Encounter for immunization: Secondary | ICD-10-CM | POA: Diagnosis not present

## 2024-08-02 DIAGNOSIS — I69319 Unspecified symptoms and signs involving cognitive functions following cerebral infarction: Secondary | ICD-10-CM | POA: Diagnosis not present

## 2024-08-02 DIAGNOSIS — N390 Urinary tract infection, site not specified: Secondary | ICD-10-CM | POA: Diagnosis not present

## 2024-08-02 DIAGNOSIS — R41841 Cognitive communication deficit: Secondary | ICD-10-CM | POA: Diagnosis not present

## 2024-08-06 DIAGNOSIS — F5101 Primary insomnia: Secondary | ICD-10-CM | POA: Diagnosis not present

## 2024-08-06 DIAGNOSIS — F0150 Vascular dementia without behavioral disturbance: Secondary | ICD-10-CM | POA: Diagnosis not present

## 2024-08-06 DIAGNOSIS — F331 Major depressive disorder, recurrent, moderate: Secondary | ICD-10-CM | POA: Diagnosis not present

## 2024-08-06 DIAGNOSIS — F411 Generalized anxiety disorder: Secondary | ICD-10-CM | POA: Diagnosis not present

## 2024-08-07 DIAGNOSIS — N390 Urinary tract infection, site not specified: Secondary | ICD-10-CM | POA: Diagnosis not present

## 2024-08-07 DIAGNOSIS — I69319 Unspecified symptoms and signs involving cognitive functions following cerebral infarction: Secondary | ICD-10-CM | POA: Diagnosis not present

## 2024-08-07 DIAGNOSIS — R41841 Cognitive communication deficit: Secondary | ICD-10-CM | POA: Diagnosis not present

## 2024-08-09 DIAGNOSIS — I69319 Unspecified symptoms and signs involving cognitive functions following cerebral infarction: Secondary | ICD-10-CM | POA: Diagnosis not present

## 2024-08-09 DIAGNOSIS — N390 Urinary tract infection, site not specified: Secondary | ICD-10-CM | POA: Diagnosis not present

## 2024-08-09 DIAGNOSIS — R41841 Cognitive communication deficit: Secondary | ICD-10-CM | POA: Diagnosis not present

## 2024-08-13 DIAGNOSIS — N390 Urinary tract infection, site not specified: Secondary | ICD-10-CM | POA: Diagnosis not present

## 2024-08-13 DIAGNOSIS — R41841 Cognitive communication deficit: Secondary | ICD-10-CM | POA: Diagnosis not present

## 2024-08-13 DIAGNOSIS — I69319 Unspecified symptoms and signs involving cognitive functions following cerebral infarction: Secondary | ICD-10-CM | POA: Diagnosis not present

## 2024-08-19 DIAGNOSIS — N390 Urinary tract infection, site not specified: Secondary | ICD-10-CM | POA: Diagnosis not present

## 2024-08-19 DIAGNOSIS — R41841 Cognitive communication deficit: Secondary | ICD-10-CM | POA: Diagnosis not present

## 2024-08-19 DIAGNOSIS — I69319 Unspecified symptoms and signs involving cognitive functions following cerebral infarction: Secondary | ICD-10-CM | POA: Diagnosis not present

## 2024-08-20 ENCOUNTER — Encounter: Payer: Self-pay | Admitting: Radiology

## 2024-08-23 DIAGNOSIS — I69319 Unspecified symptoms and signs involving cognitive functions following cerebral infarction: Secondary | ICD-10-CM | POA: Diagnosis not present

## 2024-08-23 DIAGNOSIS — N390 Urinary tract infection, site not specified: Secondary | ICD-10-CM | POA: Diagnosis not present

## 2024-08-23 DIAGNOSIS — R41841 Cognitive communication deficit: Secondary | ICD-10-CM | POA: Diagnosis not present

## 2024-08-24 DIAGNOSIS — R41841 Cognitive communication deficit: Secondary | ICD-10-CM | POA: Diagnosis not present

## 2024-08-24 DIAGNOSIS — I69319 Unspecified symptoms and signs involving cognitive functions following cerebral infarction: Secondary | ICD-10-CM | POA: Diagnosis not present

## 2024-08-24 DIAGNOSIS — N390 Urinary tract infection, site not specified: Secondary | ICD-10-CM | POA: Diagnosis not present

## 2024-09-03 DIAGNOSIS — F5101 Primary insomnia: Secondary | ICD-10-CM | POA: Diagnosis not present

## 2024-09-03 DIAGNOSIS — F411 Generalized anxiety disorder: Secondary | ICD-10-CM | POA: Diagnosis not present

## 2024-09-03 DIAGNOSIS — F331 Major depressive disorder, recurrent, moderate: Secondary | ICD-10-CM | POA: Diagnosis not present

## 2024-09-03 DIAGNOSIS — F0150 Vascular dementia without behavioral disturbance: Secondary | ICD-10-CM | POA: Diagnosis not present

## 2024-09-29 DIAGNOSIS — E119 Type 2 diabetes mellitus without complications: Secondary | ICD-10-CM | POA: Diagnosis not present
